# Patient Record
Sex: Male | Born: 1940 | Race: Black or African American | Hispanic: No | Marital: Married | Smoking: Former smoker
Health system: Southern US, Community
[De-identification: ages and names within clinical notes are randomized; demographics above are authoritative.]

## PROBLEM LIST (undated history)

## (undated) DIAGNOSIS — G4730 Sleep apnea, unspecified: Secondary | ICD-10-CM

## (undated) DIAGNOSIS — I10 Essential (primary) hypertension: Secondary | ICD-10-CM

## (undated) DIAGNOSIS — R569 Unspecified convulsions: Secondary | ICD-10-CM

## (undated) DIAGNOSIS — I5030 Unspecified diastolic (congestive) heart failure: Secondary | ICD-10-CM

## (undated) DIAGNOSIS — E119 Type 2 diabetes mellitus without complications: Secondary | ICD-10-CM

## (undated) DIAGNOSIS — N189 Chronic kidney disease, unspecified: Secondary | ICD-10-CM

## (undated) DIAGNOSIS — M1990 Unspecified osteoarthritis, unspecified site: Secondary | ICD-10-CM

## (undated) DIAGNOSIS — N183 Chronic kidney disease, stage 3 unspecified: Secondary | ICD-10-CM

## (undated) DIAGNOSIS — I82409 Acute embolism and thrombosis of unspecified deep veins of unspecified lower extremity: Secondary | ICD-10-CM

## (undated) DIAGNOSIS — M7989 Other specified soft tissue disorders: Secondary | ICD-10-CM

## (undated) DIAGNOSIS — M7750 Other enthesopathy of unspecified foot: Secondary | ICD-10-CM

## (undated) DIAGNOSIS — F0390 Unspecified dementia without behavioral disturbance: Secondary | ICD-10-CM

## (undated) DIAGNOSIS — M109 Gout, unspecified: Secondary | ICD-10-CM

## (undated) DIAGNOSIS — N400 Enlarged prostate without lower urinary tract symptoms: Secondary | ICD-10-CM

## (undated) DIAGNOSIS — Z8739 Personal history of other diseases of the musculoskeletal system and connective tissue: Secondary | ICD-10-CM

## (undated) HISTORY — PX: APPENDECTOMY: SHX54

## (undated) HISTORY — DX: Acute embolism and thrombosis of unspecified deep veins of unspecified lower extremity: I82.409

## (undated) HISTORY — PX: HERNIA REPAIR: SHX51

## (undated) HISTORY — DX: Unspecified osteoarthritis, unspecified site: M19.90

## (undated) HISTORY — DX: Chronic kidney disease, unspecified: N18.9

## (undated) HISTORY — DX: Benign prostatic hyperplasia without lower urinary tract symptoms: N40.0

## (undated) HISTORY — DX: Other enthesopathy of unspecified foot and ankle: M77.50

## (undated) HISTORY — DX: Sleep apnea, unspecified: G47.30

## (undated) HISTORY — PX: PROSTATE SURGERY: SHX751

## (undated) HISTORY — DX: Personal history of other diseases of the musculoskeletal system and connective tissue: Z87.39

## (undated) HISTORY — DX: Other specified soft tissue disorders: M79.89

## (undated) HISTORY — DX: Essential (primary) hypertension: I10

## (undated) HISTORY — DX: Unspecified convulsions: R56.9

---
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
## 1999-01-24 ENCOUNTER — Other Ambulatory Visit: Admission: RE | Admit: 1999-01-24 | Discharge: 1999-01-24 | Payer: Self-pay | Admitting: Otolaryngology

## 1999-02-18 ENCOUNTER — Ambulatory Visit (HOSPITAL_BASED_OUTPATIENT_CLINIC_OR_DEPARTMENT_OTHER): Admission: RE | Admit: 1999-02-18 | Discharge: 1999-02-18 | Payer: Self-pay | Admitting: Otolaryngology

## 2008-09-12 ENCOUNTER — Ambulatory Visit: Payer: Self-pay | Admitting: Nephrology

## 2009-04-22 ENCOUNTER — Emergency Department: Payer: Self-pay | Admitting: Unknown Physician Specialty

## 2011-07-03 ENCOUNTER — Ambulatory Visit: Payer: Self-pay | Admitting: Urology

## 2011-07-03 LAB — BASIC METABOLIC PANEL
Anion Gap: 5 — ABNORMAL LOW (ref 7–16)
BUN: 26 mg/dL — ABNORMAL HIGH (ref 7–18)
Calcium, Total: 9.2 mg/dL (ref 8.5–10.1)
Chloride: 107 mmol/L (ref 98–107)
Co2: 28 mmol/L (ref 21–32)
Creatinine: 1.25 mg/dL (ref 0.60–1.30)
EGFR (African American): >60
EGFR (Non-African Amer.): >60
Glucose: 76 mg/dL (ref 65–99)
Osmolality: 283 (ref 275–301)
Potassium: 5.1 mmol/L (ref 3.5–5.1)
Sodium: 140 mmol/L (ref 136–145)

## 2011-07-03 LAB — HEMOGLOBIN: HGB: 13 g/dL (ref 13.0–18.0)

## 2011-07-07 ENCOUNTER — Ambulatory Visit: Payer: Self-pay | Admitting: Urology

## 2011-07-15 ENCOUNTER — Ambulatory Visit: Payer: Self-pay | Admitting: General Practice

## 2011-08-28 ENCOUNTER — Ambulatory Visit: Payer: Self-pay

## 2011-09-02 ENCOUNTER — Other Ambulatory Visit: Payer: Self-pay | Admitting: Physician Assistant

## 2011-09-02 ENCOUNTER — Inpatient Hospital Stay: Payer: Self-pay | Admitting: Internal Medicine

## 2011-09-02 ENCOUNTER — Ambulatory Visit: Payer: Self-pay | Admitting: General Practice

## 2011-09-02 LAB — COMPREHENSIVE METABOLIC PANEL
Albumin: 3.7 g/dL (ref 3.4–5.0)
Albumin: 3.7 g/dL (ref 3.4–5.0)
Alkaline Phosphatase: 80 U/L (ref 50–136)
Alkaline Phosphatase: 74 U/L (ref 50–136)
Anion Gap: 10 (ref 7–16)
Anion Gap: 10 (ref 7–16)
BUN: 26 mg/dL — ABNORMAL HIGH (ref 7–18)
BUN: 28 mg/dL — ABNORMAL HIGH (ref 7–18)
Bilirubin,Total: 0.5 mg/dL (ref 0.2–1.0)
Bilirubin,Total: 0.4 mg/dL (ref 0.2–1.0)
Calcium, Total: 9.2 mg/dL (ref 8.5–10.1)
Calcium, Total: 9.3 mg/dL (ref 8.5–10.1)
Chloride: 104 mmol/L (ref 98–107)
Chloride: 105 mmol/L (ref 98–107)
Co2: 26 mmol/L (ref 21–32)
Co2: 26 mmol/L (ref 21–32)
Creatinine: 1.45 mg/dL — ABNORMAL HIGH (ref 0.60–1.30)
Creatinine: 1.41 mg/dL — ABNORMAL HIGH (ref 0.60–1.30)
EGFR (African American): >60
EGFR (African American): >60
EGFR (Non-African Amer.): 51 — ABNORMAL LOW
EGFR (Non-African Amer.): 53 — ABNORMAL LOW
Glucose: 93 mg/dL (ref 65–99)
Glucose: 75 mg/dL (ref 65–99)
Osmolality: 284 (ref 275–301)
Osmolality: 285 (ref 275–301)
Potassium: 4.3 mmol/L (ref 3.5–5.1)
Potassium: 4.3 mmol/L (ref 3.5–5.1)
SGOT(AST): 46 U/L — ABNORMAL HIGH (ref 15–37)
SGOT(AST): 46 U/L — ABNORMAL HIGH (ref 15–37)
SGPT (ALT): 55 U/L
SGPT (ALT): 53 U/L
Sodium: 140 mmol/L (ref 136–145)
Sodium: 141 mmol/L (ref 136–145)
Total Protein: 7.5 g/dL (ref 6.4–8.2)
Total Protein: 7.9 g/dL (ref 6.4–8.2)

## 2011-09-02 LAB — CBC WITH DIFFERENTIAL/PLATELET
Basophil #: 0 10*3/uL (ref 0.0–0.1)
Basophil %: 0.3 %
Eosinophil #: 0.3 10*3/uL (ref 0.0–0.7)
Eosinophil %: 4.5 %
HCT: 40 % (ref 40.0–52.0)
HGB: 12.7 g/dL — ABNORMAL LOW (ref 13.0–18.0)
Lymphocyte #: 2 10*3/uL (ref 1.0–3.6)
Lymphocyte %: 30.3 %
MCH: 23.1 pg — ABNORMAL LOW (ref 26.0–34.0)
MCHC: 31.7 g/dL — ABNORMAL LOW (ref 32.0–36.0)
MCV: 73 fL — ABNORMAL LOW (ref 80–100)
Monocyte #: 0.8 10*3/uL — ABNORMAL HIGH (ref 0.0–0.7)
Monocyte %: 12.1 %
Neutrophil #: 3.3 10*3/uL (ref 1.4–6.5)
Neutrophil %: 52.8 %
Platelet: 192 10*3/uL (ref 150–440)
RBC: 5.49 10*6/uL (ref 4.40–5.90)
RDW: 16.8 % — ABNORMAL HIGH (ref 11.5–14.5)
WBC: 6.4 10*3/uL (ref 3.8–10.6)

## 2011-09-02 LAB — CBC
HCT: 39.6 % — ABNORMAL LOW (ref 40.0–52.0)
HGB: 12.4 g/dL — ABNORMAL LOW (ref 13.0–18.0)
MCH: 23 pg — ABNORMAL LOW (ref 26.0–34.0)
MCHC: 31.3 g/dL — ABNORMAL LOW (ref 32.0–36.0)
MCV: 73 fL — ABNORMAL LOW (ref 80–100)
Platelet: 195 10*3/uL (ref 150–440)
RBC: 5.39 10*6/uL (ref 4.40–5.90)
RDW: 18.4 % — ABNORMAL HIGH (ref 11.5–14.5)
WBC: 6.6 10*3/uL (ref 3.8–10.6)

## 2011-09-02 LAB — PROTIME-INR
INR: 1
Prothrombin Time: 13.4 secs (ref 11.5–14.7)

## 2011-09-02 LAB — PRO B NATRIURETIC PEPTIDE: B-Type Natriuretic Peptide: 139 pg/mL — ABNORMAL HIGH (ref 0–125)

## 2011-09-02 LAB — URIC ACID: Uric Acid: 6 mg/dL (ref 3.5–7.2)

## 2011-09-02 LAB — APTT: Activated PTT: 32.9 secs (ref 23.6–35.9)

## 2011-09-02 LAB — MAGNESIUM: Magnesium: 1.7 mg/dL — ABNORMAL LOW

## 2011-09-03 LAB — BASIC METABOLIC PANEL
Anion Gap: 13 (ref 7–16)
BUN: 23 mg/dL — ABNORMAL HIGH (ref 7–18)
Calcium, Total: 8.8 mg/dL (ref 8.5–10.1)
Chloride: 104 mmol/L (ref 98–107)
Co2: 23 mmol/L (ref 21–32)
Creatinine: 1.31 mg/dL — ABNORMAL HIGH (ref 0.60–1.30)
EGFR (African American): >60
EGFR (Non-African Amer.): 58 — ABNORMAL LOW
Glucose: 94 mg/dL (ref 65–99)
Osmolality: 283 (ref 275–301)
Potassium: 4 mmol/L (ref 3.5–5.1)
Sodium: 140 mmol/L (ref 136–145)

## 2011-09-03 LAB — APTT
Activated PTT: 147.3 secs — ABNORMAL HIGH (ref 23.6–35.9)
Activated PTT: 153.4 secs — ABNORMAL HIGH (ref 23.6–35.9)
Activated PTT: >160 secs (ref 23.6–35.9)
Activated PTT: >160 secs (ref 23.6–35.9)

## 2011-09-03 LAB — CBC WITH DIFFERENTIAL/PLATELET
Basophil #: 0 10*3/uL (ref 0.0–0.1)
Basophil %: 0.3 %
Eosinophil #: 0.3 10*3/uL (ref 0.0–0.7)
Eosinophil %: 4.8 %
HCT: 36 % — ABNORMAL LOW (ref 40.0–52.0)
HGB: 11.5 g/dL — ABNORMAL LOW (ref 13.0–18.0)
Lymphocyte #: 1.8 10*3/uL (ref 1.0–3.6)
Lymphocyte %: 30 %
MCH: 23.4 pg — ABNORMAL LOW (ref 26.0–34.0)
MCHC: 31.8 g/dL — ABNORMAL LOW (ref 32.0–36.0)
MCV: 73 fL — ABNORMAL LOW (ref 80–100)
Monocyte #: 0.9 10*3/uL — ABNORMAL HIGH (ref 0.0–0.7)
Monocyte %: 15.7 %
Neutrophil #: 3 10*3/uL (ref 1.4–6.5)
Neutrophil %: 49.2 %
Platelet: 185 10*3/uL (ref 150–440)
RBC: 4.91 10*6/uL (ref 4.40–5.90)
RDW: 17.6 % — ABNORMAL HIGH (ref 11.5–14.5)
WBC: 6 10*3/uL (ref 3.8–10.6)

## 2011-09-04 LAB — PROTIME-INR
INR: 1.1
Prothrombin Time: 14.1 secs (ref 11.5–14.7)

## 2011-09-04 LAB — APTT
Activated PTT: 145.3 secs — ABNORMAL HIGH (ref 23.6–35.9)
Activated PTT: 103.3 secs — ABNORMAL HIGH (ref 23.6–35.9)

## 2011-09-05 LAB — CREATININE, SERUM
Creatinine: 1.26 mg/dL (ref 0.60–1.30)
EGFR (African American): >60
EGFR (Non-African Amer.): >60

## 2011-09-05 LAB — PLATELET COUNT: Platelet: 220 10*3/uL (ref 150–440)

## 2011-09-05 LAB — HEMOGLOBIN: HGB: 11.4 g/dL — ABNORMAL LOW (ref 13.0–18.0)

## 2011-09-05 LAB — APTT: Activated PTT: 107.8 secs — ABNORMAL HIGH (ref 23.6–35.9)

## 2011-09-26 ENCOUNTER — Ambulatory Visit: Payer: Self-pay | Admitting: Internal Medicine

## 2011-09-26 LAB — CBC CANCER CENTER
Basophil #: 0 x10 3/mm (ref 0.0–0.1)
Basophil %: 0.4 %
Eosinophil #: 0.1 x10 3/mm (ref 0.0–0.7)
Eosinophil %: 1.5 %
HCT: 37.7 % — ABNORMAL LOW (ref 40.0–52.0)
HGB: 12.1 g/dL — ABNORMAL LOW (ref 13.0–18.0)
Lymphocyte #: 1.9 x10 3/mm (ref 1.0–3.6)
Lymphocyte %: 26.6 %
MCH: 23.5 pg — ABNORMAL LOW (ref 26.0–34.0)
MCHC: 32.1 g/dL (ref 32.0–36.0)
MCV: 73 fL — ABNORMAL LOW (ref 80–100)
Monocyte #: 0.7 x10 3/mm (ref 0.0–0.7)
Monocyte %: 10.4 %
Neutrophil #: 4.3 x10 3/mm (ref 1.4–6.5)
Neutrophil %: 61.1 %
Platelet: 194 x10 3/mm (ref 150–440)
RBC: 5.15 10*6/uL (ref 4.40–5.90)
RDW: 17.9 % — ABNORMAL HIGH (ref 11.5–14.5)
WBC: 7 x10 3/mm (ref 3.8–10.6)

## 2011-09-26 LAB — CREATININE, SERUM
Creatinine: 1.73 mg/dL — ABNORMAL HIGH (ref 0.60–1.30)
EGFR (African American): 51 — ABNORMAL LOW
EGFR (Non-African Amer.): 42 — ABNORMAL LOW

## 2011-09-26 LAB — IRON AND TIBC
Iron Bind.Cap.(Total): 313 ug/dL (ref 250–450)
Iron Saturation: 21 %
Iron: 66 ug/dL (ref 65–175)
Unbound Iron-Bind.Cap.: 247 ug/dL

## 2011-09-26 LAB — RETICULOCYTES
Absolute Retic Count: 0.111 10*6/uL — ABNORMAL HIGH (ref 0.024–0.084)
Reticulocyte: 2.2 % — ABNORMAL HIGH (ref 0.5–1.5)

## 2011-09-26 LAB — FERRITIN: Ferritin (ARMC): 192 ng/mL (ref 8–388)

## 2011-09-27 LAB — CEA: CEA: 3.6 ng/mL (ref 0.0–4.7)

## 2011-09-27 LAB — CANCER ANTIGEN 19-9: CA 19-9: <1 U/mL (ref 0–35)

## 2011-09-29 ENCOUNTER — Ambulatory Visit: Payer: Self-pay | Admitting: Physician Assistant

## 2011-10-10 LAB — CBC CANCER CENTER
Basophil #: 0 x10 3/mm (ref 0.0–0.1)
Basophil %: 0.5 %
Eosinophil #: 0.1 x10 3/mm (ref 0.0–0.7)
Eosinophil %: 1.8 %
HCT: 39 % — ABNORMAL LOW (ref 40.0–52.0)
HGB: 12 g/dL — ABNORMAL LOW (ref 13.0–18.0)
Lymphocyte #: 1.8 x10 3/mm (ref 1.0–3.6)
Lymphocyte %: 28.1 %
MCH: 22.8 pg — ABNORMAL LOW (ref 26.0–34.0)
MCHC: 30.8 g/dL — ABNORMAL LOW (ref 32.0–36.0)
MCV: 74 fL — ABNORMAL LOW (ref 80–100)
Monocyte #: 0.7 x10 3/mm (ref 0.2–1.0)
Monocyte %: 11.8 %
Neutrophil #: 3.7 x10 3/mm (ref 1.4–6.5)
Neutrophil %: 57.8 %
Platelet: 197 x10 3/mm (ref 150–440)
RBC: 5.27 10*6/uL (ref 4.40–5.90)
RDW: 18 % — ABNORMAL HIGH (ref 11.5–14.5)
WBC: 6.3 x10 3/mm (ref 3.8–10.6)

## 2011-10-10 LAB — RETICULOCYTES
Absolute Retic Count: 0.0785 10*6/uL (ref 0.024–0.084)
Reticulocyte: 1.49 % (ref 0.5–1.5)

## 2011-10-10 LAB — CREATININE, SERUM
Creatinine: 1.9 mg/dL — ABNORMAL HIGH (ref 0.60–1.30)
EGFR (African American): 41 — ABNORMAL LOW
EGFR (Non-African Amer.): 35 — ABNORMAL LOW

## 2011-10-14 LAB — PROT IMMUNOELECTROPHORES(ARMC)

## 2011-10-14 LAB — KAPPA/LAMBDA FREE LIGHT CHAINS (ARMC)

## 2011-10-22 ENCOUNTER — Ambulatory Visit: Payer: Self-pay | Admitting: Internal Medicine

## 2011-10-22 ENCOUNTER — Ambulatory Visit: Payer: Self-pay | Admitting: Physician Assistant

## 2011-11-05 LAB — CBC CANCER CENTER
Basophil #: 0 x10 3/mm (ref 0.0–0.1)
Basophil %: 0.7 %
Eosinophil #: 0.2 x10 3/mm (ref 0.0–0.7)
Eosinophil %: 4.2 %
HCT: 33.4 % — ABNORMAL LOW (ref 40.0–52.0)
HGB: 10.3 g/dL — ABNORMAL LOW (ref 13.0–18.0)
Lymphocyte #: 1.5 x10 3/mm (ref 1.0–3.6)
Lymphocyte %: 28.9 %
MCH: 22.9 pg — ABNORMAL LOW (ref 26.0–34.0)
MCHC: 30.8 g/dL — ABNORMAL LOW (ref 32.0–36.0)
MCV: 74 fL — ABNORMAL LOW (ref 80–100)
Monocyte #: 0.7 x10 3/mm (ref 0.2–1.0)
Monocyte %: 13.3 %
Neutrophil #: 2.8 x10 3/mm (ref 1.4–6.5)
Neutrophil %: 52.9 %
Platelet: 186 x10 3/mm (ref 150–440)
RBC: 4.49 10*6/uL (ref 4.40–5.90)
RDW: 17.9 % — ABNORMAL HIGH (ref 11.5–14.5)
WBC: 5.3 x10 3/mm (ref 3.8–10.6)

## 2011-11-05 LAB — CREATININE, SERUM
Creatinine: 1.98 mg/dL — ABNORMAL HIGH (ref 0.60–1.30)
EGFR (African American): 39 — ABNORMAL LOW
EGFR (Non-African Amer.): 33 — ABNORMAL LOW

## 2011-11-05 LAB — URIC ACID: Uric Acid: 6.5 mg/dL (ref 3.5–7.2)

## 2011-11-05 LAB — CALCIUM: Calcium, Total: 9.3 mg/dL (ref 8.5–10.1)

## 2011-11-07 LAB — CREATININE, SERUM
Creatinine: 1.83 mg/dL — ABNORMAL HIGH (ref 0.60–1.30)
EGFR (African American): 43 — ABNORMAL LOW
EGFR (Non-African Amer.): 37 — ABNORMAL LOW

## 2011-11-11 LAB — OCCULT BLOOD X 1 CARD TO LAB, STOOL
Occult Blood, Feces: NEGATIVE
Occult Blood, Feces: NEGATIVE
Occult Blood, Feces: NEGATIVE

## 2011-11-14 LAB — CREATININE, SERUM
Creatinine: 1.69 mg/dL — ABNORMAL HIGH (ref 0.60–1.30)
EGFR (African American): 47 — ABNORMAL LOW
EGFR (Non-African Amer.): 41 — ABNORMAL LOW

## 2011-11-14 LAB — CANCER CENTER HEMOGLOBIN: HGB: 10.2 g/dL — ABNORMAL LOW (ref 13.0–18.0)

## 2011-11-18 LAB — URINE IEP, RANDOM

## 2011-11-22 ENCOUNTER — Ambulatory Visit: Payer: Self-pay | Admitting: Internal Medicine

## 2011-12-01 LAB — CBC CANCER CENTER
Basophil #: 0 x10 3/mm (ref 0.0–0.1)
Basophil %: 0.4 %
Eosinophil #: 0.1 x10 3/mm (ref 0.0–0.7)
Eosinophil %: 1.2 %
HCT: 32.5 % — ABNORMAL LOW (ref 40.0–52.0)
HGB: 10 g/dL — ABNORMAL LOW (ref 13.0–18.0)
Lymphocyte #: 2.2 x10 3/mm (ref 1.0–3.6)
Lymphocyte %: 29.2 %
MCH: 22.7 pg — ABNORMAL LOW (ref 26.0–34.0)
MCHC: 30.9 g/dL — ABNORMAL LOW (ref 32.0–36.0)
MCV: 74 fL — ABNORMAL LOW (ref 80–100)
Monocyte #: 0.8 x10 3/mm (ref 0.2–1.0)
Monocyte %: 10.9 %
Neutrophil #: 4.4 x10 3/mm (ref 1.4–6.5)
Neutrophil %: 58.3 %
Platelet: 201 x10 3/mm (ref 150–440)
RBC: 4.41 10*6/uL (ref 4.40–5.90)
RDW: 17.7 % — ABNORMAL HIGH (ref 11.5–14.5)
WBC: 7.5 x10 3/mm (ref 3.8–10.6)

## 2011-12-01 LAB — CREATININE, SERUM
Creatinine: 2.65 mg/dL — ABNORMAL HIGH (ref 0.60–1.30)
EGFR (African American): 27 — ABNORMAL LOW
EGFR (Non-African Amer.): 24 — ABNORMAL LOW

## 2011-12-02 LAB — URINE IEP, RANDOM

## 2011-12-08 LAB — CREATININE, SERUM
Creatinine: 1.96 mg/dL — ABNORMAL HIGH (ref 0.60–1.30)
EGFR (African American): 39 — ABNORMAL LOW
EGFR (Non-African Amer.): 34 — ABNORMAL LOW

## 2011-12-22 ENCOUNTER — Ambulatory Visit: Payer: Self-pay | Admitting: Internal Medicine

## 2012-01-05 LAB — CREATININE, SERUM
Creatinine: 1.63 mg/dL — ABNORMAL HIGH
EGFR (African American): 49 — ABNORMAL LOW
EGFR (Non-African Amer.): 42 — ABNORMAL LOW

## 2012-01-05 LAB — CBC CANCER CENTER
Basophil #: 0 "x10 3/mm "
Basophil %: 0.5 %
Eosinophil #: 0.1 "x10 3/mm "
Eosinophil %: 1.6 %
HCT: 33.8 % — ABNORMAL LOW
HGB: 10.2 g/dL — ABNORMAL LOW
Lymphocyte %: 20.4 %
Lymphs Abs: 1.5 "x10 3/mm "
MCH: 22.3 pg — ABNORMAL LOW
MCHC: 30.1 g/dL — ABNORMAL LOW
MCV: 74 fL — ABNORMAL LOW
Monocyte #: 0.9 "x10 3/mm "
Monocyte %: 11.9 %
Neutrophil #: 4.9 "x10 3/mm "
Neutrophil %: 65.6 %
Platelet: 205 "x10 3/mm "
RBC: 4.55 "x10 6/mm "
RDW: 17.4 % — ABNORMAL HIGH
WBC: 7.5 "x10 3/mm "

## 2012-01-22 ENCOUNTER — Ambulatory Visit: Payer: Self-pay | Admitting: Internal Medicine

## 2012-03-15 ENCOUNTER — Ambulatory Visit: Payer: Self-pay | Admitting: Internal Medicine

## 2012-03-15 LAB — CBC CANCER CENTER
Basophil #: 0 x10 3/mm (ref 0.0–0.1)
Basophil %: 0.4 %
Eosinophil #: 0.1 x10 3/mm (ref 0.0–0.7)
Eosinophil %: 2 %
HCT: 36.6 % — ABNORMAL LOW (ref 40.0–52.0)
HGB: 11.3 g/dL — ABNORMAL LOW (ref 13.0–18.0)
Lymphocyte #: 1.6 x10 3/mm (ref 1.0–3.6)
Lymphocyte %: 26.6 %
MCH: 21.9 pg — ABNORMAL LOW (ref 26.0–34.0)
MCHC: 30.8 g/dL — ABNORMAL LOW (ref 32.0–36.0)
MCV: 71 fL — ABNORMAL LOW (ref 80–100)
Monocyte #: 0.7 x10 3/mm (ref 0.2–1.0)
Monocyte %: 12.2 %
Neutrophil #: 3.5 x10 3/mm (ref 1.4–6.5)
Neutrophil %: 58.8 %
Platelet: 188 x10 3/mm (ref 150–440)
RBC: 5.16 10*6/uL (ref 4.40–5.90)
RDW: 17.7 % — ABNORMAL HIGH (ref 11.5–14.5)
WBC: 6 x10 3/mm (ref 3.8–10.6)

## 2012-03-15 LAB — CREATININE, SERUM
Creatinine: 1.51 mg/dL — ABNORMAL HIGH (ref 0.60–1.30)
EGFR (African American): 53 — ABNORMAL LOW
EGFR (Non-African Amer.): 46 — ABNORMAL LOW

## 2012-03-15 LAB — RETICULOCYTES
Absolute Retic Count: 0.0692 10*6/uL (ref 0.031–0.129)
Reticulocyte: 1.34 % (ref 0.7–2.5)

## 2012-03-23 ENCOUNTER — Ambulatory Visit: Payer: Self-pay | Admitting: Internal Medicine

## 2012-05-10 ENCOUNTER — Ambulatory Visit: Payer: Self-pay | Admitting: Internal Medicine

## 2012-05-10 LAB — CANCER CENTER HEMOGLOBIN: HGB: 11.6 g/dL — ABNORMAL LOW (ref 13.0–18.0)

## 2012-05-23 ENCOUNTER — Ambulatory Visit: Payer: Self-pay | Admitting: Internal Medicine

## 2012-06-23 ENCOUNTER — Ambulatory Visit: Payer: Self-pay | Admitting: Internal Medicine

## 2012-07-24 ENCOUNTER — Ambulatory Visit: Payer: Self-pay | Admitting: Internal Medicine

## 2012-07-29 LAB — CANCER CENTER HEMOGLOBIN: HGB: 12.4 g/dL — ABNORMAL LOW (ref 13.0–18.0)

## 2012-08-21 ENCOUNTER — Ambulatory Visit: Payer: Self-pay | Admitting: Internal Medicine

## 2012-09-02 ENCOUNTER — Emergency Department: Payer: Self-pay | Admitting: Emergency Medicine

## 2012-09-02 LAB — URINALYSIS, COMPLETE
Bacteria: NONE SEEN
Bilirubin,UR: NEGATIVE
Glucose,UR: NEGATIVE mg/dL (ref 0–75)
Ketone: NEGATIVE
Leukocyte Esterase: NEGATIVE
Nitrite: NEGATIVE
Ph: 5 (ref 4.5–8.0)
Protein: NEGATIVE
RBC,UR: 1 /HPF (ref 0–5)
Specific Gravity: 1.012 (ref 1.003–1.030)
Squamous Epithelial: NONE SEEN
WBC UR: <1 /HPF (ref 0–5)

## 2012-09-02 LAB — CBC WITH DIFFERENTIAL/PLATELET
Basophil #: 0 10*3/uL (ref 0.0–0.1)
Basophil %: 0.4 %
Eosinophil #: 0 10*3/uL (ref 0.0–0.7)
Eosinophil %: 0.3 %
HCT: 36.5 % — ABNORMAL LOW (ref 40.0–52.0)
HGB: 11.5 g/dL — ABNORMAL LOW (ref 13.0–18.0)
Lymphocyte #: 0.3 10*3/uL — ABNORMAL LOW (ref 1.0–3.6)
Lymphocyte %: 4.6 %
MCH: 22.6 pg — ABNORMAL LOW (ref 26.0–34.0)
MCHC: 31.5 g/dL — ABNORMAL LOW (ref 32.0–36.0)
MCV: 72 fL — ABNORMAL LOW (ref 80–100)
Monocyte #: 0.4 x10 3/mm (ref 0.2–1.0)
Monocyte %: 5.6 %
Neutrophil #: 6.8 10*3/uL — ABNORMAL HIGH (ref 1.4–6.5)
Neutrophil %: 89.1 %
Platelet: 167 10*3/uL (ref 150–440)
RBC: 5.1 10*6/uL (ref 4.40–5.90)
RDW: 17.7 % — ABNORMAL HIGH (ref 11.5–14.5)
WBC: 7.6 10*3/uL (ref 3.8–10.6)

## 2012-09-02 LAB — BASIC METABOLIC PANEL
Anion Gap: 6 — ABNORMAL LOW (ref 7–16)
BUN: 34 mg/dL — ABNORMAL HIGH (ref 7–18)
Calcium, Total: 8.7 mg/dL (ref 8.5–10.1)
Chloride: 109 mmol/L — ABNORMAL HIGH (ref 98–107)
Co2: 24 mmol/L (ref 21–32)
Creatinine: 1.79 mg/dL — ABNORMAL HIGH (ref 0.60–1.30)
EGFR (African American): 44 — ABNORMAL LOW
EGFR (Non-African Amer.): 38 — ABNORMAL LOW
Glucose: 151 mg/dL — ABNORMAL HIGH (ref 65–99)
Osmolality: 288 (ref 275–301)
Potassium: 4.5 mmol/L (ref 3.5–5.1)
Sodium: 139 mmol/L (ref 136–145)

## 2012-09-02 LAB — DRUG SCREEN, URINE

## 2012-09-02 LAB — ETHANOL
Ethanol %: <0.003 % (ref 0.000–0.080)
Ethanol: <3 mg/dL

## 2012-09-03 ENCOUNTER — Ambulatory Visit: Payer: Self-pay

## 2012-09-03 LAB — CREATININE, SERUM
Creatinine: 1.62 mg/dL — ABNORMAL HIGH (ref 0.60–1.30)
EGFR (African American): 49 — ABNORMAL LOW
EGFR (Non-African Amer.): 42 — ABNORMAL LOW

## 2012-09-21 ENCOUNTER — Ambulatory Visit: Payer: Self-pay | Admitting: Neurology

## 2012-10-23 ENCOUNTER — Emergency Department: Payer: Self-pay | Admitting: Emergency Medicine

## 2012-10-23 LAB — CBC WITH DIFFERENTIAL/PLATELET
Basophil #: 0 10*3/uL (ref 0.0–0.1)
Basophil %: 0.3 %
Eosinophil #: 0.1 10*3/uL (ref 0.0–0.7)
Eosinophil %: 1 %
HCT: 39.4 % — ABNORMAL LOW (ref 40.0–52.0)
HGB: 12.6 g/dL — ABNORMAL LOW (ref 13.0–18.0)
Lymphocyte #: 1.6 10*3/uL (ref 1.0–3.6)
Lymphocyte %: 18.7 %
MCH: 22.9 pg — ABNORMAL LOW (ref 26.0–34.0)
MCHC: 31.9 g/dL — ABNORMAL LOW (ref 32.0–36.0)
MCV: 72 fL — ABNORMAL LOW (ref 80–100)
Monocyte #: 0.5 x10 3/mm (ref 0.2–1.0)
Monocyte %: 5.5 %
Neutrophil #: 6.2 10*3/uL (ref 1.4–6.5)
Neutrophil %: 74.5 %
Platelet: 174 10*3/uL (ref 150–440)
RBC: 5.49 10*6/uL (ref 4.40–5.90)
RDW: 18.1 % — ABNORMAL HIGH (ref 11.5–14.5)
WBC: 8.3 10*3/uL (ref 3.8–10.6)

## 2012-10-23 LAB — BASIC METABOLIC PANEL
Anion Gap: 7 (ref 7–16)
BUN: 26 mg/dL — ABNORMAL HIGH (ref 7–18)
Calcium, Total: 9.6 mg/dL (ref 8.5–10.1)
Chloride: 109 mmol/L — ABNORMAL HIGH (ref 98–107)
Co2: 24 mmol/L (ref 21–32)
Creatinine: 1.73 mg/dL — ABNORMAL HIGH (ref 0.60–1.30)
EGFR (African American): 45 — ABNORMAL LOW
EGFR (Non-African Amer.): 39 — ABNORMAL LOW
Glucose: 144 mg/dL — ABNORMAL HIGH (ref 65–99)
Osmolality: 287 (ref 275–301)
Potassium: 4.4 mmol/L (ref 3.5–5.1)
Sodium: 140 mmol/L (ref 136–145)

## 2013-02-23 ENCOUNTER — Ambulatory Visit: Payer: Self-pay | Admitting: Neurology

## 2013-03-08 ENCOUNTER — Ambulatory Visit: Payer: Self-pay | Admitting: Neurology

## 2013-04-07 ENCOUNTER — Encounter: Payer: Self-pay | Admitting: Podiatry

## 2013-04-08 ENCOUNTER — Encounter: Payer: Self-pay | Admitting: Podiatry

## 2013-04-08 ENCOUNTER — Ambulatory Visit (INDEPENDENT_AMBULATORY_CARE_PROVIDER_SITE_OTHER): Payer: Medicare Other | Admitting: Podiatry

## 2013-04-08 VITALS — BP 125/73 | HR 64 | Resp 16 | Ht 72.0 in | Wt 305.0 lb

## 2013-04-08 DIAGNOSIS — B351 Tinea unguium: Secondary | ICD-10-CM

## 2013-04-08 DIAGNOSIS — M79609 Pain in unspecified limb: Secondary | ICD-10-CM

## 2013-04-08 NOTE — Progress Notes (Signed)
Subjective:     Patient ID: Justin Shannon, male   DOB: 1941/03/14, 72 y.o.   MRN: [id number]  HPI patient states my nails are bothering me and are too thick for me to cut   Review of Systems  All other systems reviewed and are negative.       Objective:   Physical Exam  Nursing note and vitals reviewed. Cardiovascular: Intact distal pulses.   Neurological: He is alert.  Skin: Skin is warm.   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

## 2013-07-08 ENCOUNTER — Ambulatory Visit: Payer: Medicare Other | Admitting: Podiatry

## 2013-11-06 DIAGNOSIS — N183 Chronic kidney disease, stage 3 unspecified: Secondary | ICD-10-CM | POA: Insufficient documentation

## 2013-11-06 DIAGNOSIS — N184 Chronic kidney disease, stage 4 (severe): Secondary | ICD-10-CM | POA: Insufficient documentation

## 2013-11-06 DIAGNOSIS — G40909 Epilepsy, unspecified, not intractable, without status epilepticus: Secondary | ICD-10-CM | POA: Insufficient documentation

## 2013-11-06 DIAGNOSIS — I825Y9 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: Secondary | ICD-10-CM | POA: Insufficient documentation

## 2013-11-06 DIAGNOSIS — Z86718 Personal history of other venous thrombosis and embolism: Secondary | ICD-10-CM | POA: Insufficient documentation

## 2013-11-06 DIAGNOSIS — R569 Unspecified convulsions: Secondary | ICD-10-CM | POA: Insufficient documentation

## 2013-11-06 DIAGNOSIS — M109 Gout, unspecified: Secondary | ICD-10-CM | POA: Insufficient documentation

## 2013-11-06 DIAGNOSIS — I129 Hypertensive chronic kidney disease with stage 1 through stage 4 chronic kidney disease, or unspecified chronic kidney disease: Secondary | ICD-10-CM | POA: Insufficient documentation

## 2013-11-06 DIAGNOSIS — J309 Allergic rhinitis, unspecified: Secondary | ICD-10-CM | POA: Insufficient documentation

## 2013-11-11 ENCOUNTER — Ambulatory Visit (INDEPENDENT_AMBULATORY_CARE_PROVIDER_SITE_OTHER): Payer: Medicare Other | Admitting: Podiatry

## 2013-11-11 ENCOUNTER — Encounter: Payer: Self-pay | Admitting: Podiatry

## 2013-11-11 VITALS — BP 110/80 | HR 68 | Resp 16

## 2013-11-11 DIAGNOSIS — M79609 Pain in unspecified limb: Secondary | ICD-10-CM

## 2013-11-11 DIAGNOSIS — B351 Tinea unguium: Secondary | ICD-10-CM

## 2013-11-11 NOTE — Progress Notes (Signed)
Subjective:     Patient ID: Justin Shannon, male   DOB: 08/31/1940, 73 y.o.   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

## 2013-11-22 DIAGNOSIS — N184 Chronic kidney disease, stage 4 (severe): Secondary | ICD-10-CM | POA: Insufficient documentation

## 2013-11-22 DIAGNOSIS — N183 Chronic kidney disease, stage 3 unspecified: Secondary | ICD-10-CM | POA: Insufficient documentation

## 2014-01-10 DIAGNOSIS — Z9989 Dependence on other enabling machines and devices: Secondary | ICD-10-CM

## 2014-01-10 DIAGNOSIS — G4733 Obstructive sleep apnea (adult) (pediatric): Secondary | ICD-10-CM | POA: Insufficient documentation

## 2014-02-10 ENCOUNTER — Other Ambulatory Visit: Payer: Medicare Other

## 2014-05-05 ENCOUNTER — Ambulatory Visit (INDEPENDENT_AMBULATORY_CARE_PROVIDER_SITE_OTHER): Payer: Medicare Other | Admitting: Podiatry

## 2014-05-05 DIAGNOSIS — B351 Tinea unguium: Secondary | ICD-10-CM

## 2014-05-05 DIAGNOSIS — M79676 Pain in unspecified toe(s): Secondary | ICD-10-CM

## 2014-05-05 NOTE — Progress Notes (Signed)
Subjective:     Patient ID: Justin Shannon., male   DOB: 12-23-1940, 73 y.o.   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

## 2014-05-24 DIAGNOSIS — Z6841 Body Mass Index (BMI) 40.0 and over, adult: Secondary | ICD-10-CM | POA: Insufficient documentation

## 2014-08-04 ENCOUNTER — Other Ambulatory Visit: Payer: Medicare Other

## 2014-08-25 ENCOUNTER — Ambulatory Visit (INDEPENDENT_AMBULATORY_CARE_PROVIDER_SITE_OTHER): Payer: Medicare Other

## 2014-08-25 ENCOUNTER — Ambulatory Visit (INDEPENDENT_AMBULATORY_CARE_PROVIDER_SITE_OTHER): Payer: Medicare Other | Admitting: Podiatry

## 2014-08-25 DIAGNOSIS — M722 Plantar fascial fibromatosis: Secondary | ICD-10-CM

## 2014-08-25 DIAGNOSIS — M79673 Pain in unspecified foot: Secondary | ICD-10-CM

## 2014-08-25 DIAGNOSIS — B351 Tinea unguium: Secondary | ICD-10-CM

## 2014-08-25 MED ORDER — TRIAMCINOLONE ACETONIDE 10 MG/ML IJ SUSP
10.0000 mg | Freq: Once | INTRAMUSCULAR | Status: AC
  Administered 2014-08-25: 10 mg

## 2014-08-25 NOTE — Progress Notes (Signed)
Subjective:     [name], male   DOB: 19-Mar-1941, 74 y.o.   MRN: [id number]  HPI patient presents stating I'm having a lot of pain in my right heel and my nails are thick and I cannot cut them myself and they become painful. States the heels been hurting for several months   Review of Systems     Objective:   Physical Exam Neurovascular status intact with muscle strength adequate and range of motion within normal limits. [name] noted to have quite a bit of discomfort plantar aspect right heel at the insertion of the tendon the calcaneus and thick yellow brittle nailbeds 1-5 both feet that are painful when pressed    Assessment:     Plantar fasciitis right with inflammation and fluid buildup and mycotic nail infection 1-5 of both feet with pain    Plan:     Reviewed both conditions and x-ray of right foot and injected the right plantar fascia 3 mg Kenalog 5 mg Xylocaine and debrided painful nailbeds 1-5 both feet with no iatrogenic bleeding noted and dispensed fascial brace with instructions on usage and instructions for supportive shoe and reappoint again in 1 week

## 2014-10-15 NOTE — Consult Note (Signed)
PATIENT NAME:  Justin Shannon, Justin Shannon MR#:  [id number] DATE OF BIRTH:  Jun 06, 1941  DATE OF CONSULTATION:  09/04/2011  REFERRING PHYSICIAN:   CONSULTING PHYSICIAN:  Algernon Huxley, MD  REASON FOR CONSULTATION: Extensive right lower extremity DVT.  HISTORY OF PRESENT ILLNESS: This is a 74 year old male who was admitted with extensive right lower extremity DVT with swelling. He's had swelling for 3 to 4 weeks now, has gotten worse over the past week or so. He has been on anticoagulation since admission two days prior and has tolerated this so far without any bleeding or other problems. We were consulted for further evaluation for adjuvant treatments in the form of either IVC filter or thrombolysis for his DVT. His swelling has improved some with the initiation of anticoagulation. He does not have any chest pain or shortness of breath.   PAST MEDICAL HISTORY:  1. Hypertension.  2. Benign prostatic hypertrophy.  3. Gout.  PAST SURGICAL HISTORY:  1. Surgery for benign prostatic hypertrophy earlier this year.  2. Hernia repair.   ALLERGIES: None.   HOME MEDICATIONS PER THE HISTORY AND PHYSICAL: 1. Losartan 100 mg daily.  2. Diltiazem 240 daily.  3. Allopurinol 100 daily.  4. Metoprolol 50 daily.  5. Allegra 180 daily.  6. Flonase nasal spray as needed.   SOCIAL HISTORY: Works at [employer] at [employer]. Lives with his wife. No alcohol or tobacco use. He is active and independent.   FAMILY HISTORY: Apparently his mother also had a history of a blood clot in the leg.  REVIEW OF SYSTEMS: GENERAL: No fevers or chills. No intentional weight loss or gain. EYES: No blurry or double vision. EARS: No tinnitus or ear pain. CARDIAC: No chest pain or palpitations. RESPIRATORY: No shortness of breath or cough. GI: No nausea, vomiting, or diarrhea. GU: No dysuria or hematuria. ENDOCRINE: No heat or cold intolerance. PSYCH: No anxiety or depression. MUSCULOSKELETAL: Right leg pain and swelling.   PHYSICAL  EXAMINATION:   GENERAL: This is a well developed, well nourished white male who was not in apparent distress.  VITAL SIGNS: Temperature 98.4, pulse 67, blood pressure 146/81, saturations are 95% on room air.   HEENT: Normocephalic, atraumatic.   EYES: Sclerae nonicteric. Conjunctivae are clear.   EARS: Normal external appearance. Hearing is intact.   NECK: Supple without adenopathy or JVD.   HEART: Regular rate and rhythm.   LUNGS: Clear to auscultation bilaterally.   ABDOMEN: Soft, nondistended, nontender.   EXTREMITIES: He has mild to moderate right lower extremity swelling with some stasis changes present. There is no significant left lower extremity swelling.   NEUROLOGIC: Normal strength and tone in all four extremities.   SKIN: Warm and dry.   PSYCH: Normal affect and mood.  LABORATORY, DIAGNOSTIC, AND RADIOLOGICAL DATA: Labs from yesterday sodium 140, potassium 4.0, chloride 104, CO2 23, BUN 23, creatinine 1.31, glucose 94, white blood cell count 6.0, hemoglobin 11.5, platelet count 185,000.   ASSESSMENT AND PLAN: This is a 73 year old with DVT which has likely been present for 3 to 4 weeks. He has been having swelling and pain since that time and this puts [place] out of the window from where thrombolysis would be of benefit. His ultrasound does show extensive DVT in the right lower extremity in all the visualized veins but given the chronicity of his symptoms I do not think offering thrombolysis at this time is likely to provide any benefit. In addition with no PE and so far having tolerated anticoagulation for the  last two days on admission, I do not think a filter is currently necessary. Should he have any issues with anticoagulation or bleeding, this would have to be stopped and then a filter would certainly be reasonable. No other recommendations at this time other than outpatient compression stockings. I would be happy to see the patient in the future for evaluation of his  postphlebitic symptoms if desired.  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   ____________________________ Algernon Huxley, MD jsd:drc D: 09/22/2011 13:52:17 ET T: 09/22/2011 14:48:18 ET JOB#: [id number]  cc: Algernon Huxley, MD, <Dictator> Algernon Huxley MD ELECTRONICALLY SIGNED 09/24/2011 15:03

## 2014-10-15 NOTE — Discharge Summary (Signed)
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 Vascular Surgery did not recommend IVC filter. The patient and family are in agreement for Xarelto along with consultant. Negative hypercoagulable work-up so far. The patient understands the risks and benefits of anticoagulation.   SECONDARY DIAGNOSES:  1. Hypertension. 2. History of gout.  3. Benign prostatic hypertrophy.   CONSULTANT: Leotis Pain, MD - Vascular Surgery.  PROCEDURES/RADIOLOGY: Right lower extremity Doppler on 09/02/2011: Extensive deep venous thrombosis of the right lower extremity involving the common femoral, superficial femoral, and popliteal vein. Complete occlusion of the popliteal vein.   MAJOR LABORATORY PANEL: Antithrombin-3 was within normal limits. Protein C and S were within normal limits. Antiphospholipid antibodies were negative. No lupus anticoagulant was detected.   HISTORY AND SHORT HOSPITAL COURSE: The patient is a 74 year old male with the above-mentioned medical problem who was admitted for extensive deep venous thrombosis of the right lower extremity involving the common femoral vein along with popliteal vein. Please see Dr. Dixie Dials dictated history and physical for further details. The patient had right lower extremity swelling on admission and was started on heparin drip along with Coumadin. He also had mild renal insufficiency, which was resolved with IV hydration and was thought to be prerenal in nature. He was evaluated by vascular surgery, Dr. Lucky Cowboy, who recommended anticoagulation for 3 to 6 months and outpatient follow-up. He did not recommend IVC filter at  this time. The patient's symptoms were slowly improving. The family had multiple questions which were answered to the best of my knowledge. After a long discussion with consultant and the family along with the patient, the decision was made to use Xarelto as an anticoagulant instead of Coumadin and the patient was switched over to Xarelto. On the date of discharge, he was feeling much better and was discharged in stable condition.   DISCHARGE PHYSICAL EXAMINATION:   VITAL SIGNS: On the date of discharge his temperature was 97.8, heart rate 70 per minute, respirations 18 per minute, blood pressure 129/80 mmHg, and he was saturating 97% on room air.   CARDIOVASCULAR: S1 and S2 normal. No murmurs, rubs, or gallops.   LUNGS: Clear to auscultation bilaterally. No wheezing, rales, rhonchi, or crepitation.   ABDOMEN: Soft and benign.   NEUROLOGIC: Nonfocal examination.   EXTREMITIES: He still has right lower extremity swelling and minimal redness mainly on standing up, but no signs of cellulitis. He was also feeling quite weak and was requiring assistance with ambulation and will benefit from home health with physical therapy and was set up by care management.   All other physical examination remained at baseline.   DISCHARGE MEDICATIONS:  1. Losartan 100 mg p.o. daily.  2. Cardizem 240 mg p.o. daily.  3. Allopurinol 100 mg p.o. daily.  4. Metoprolol 50 mg p.o. daily.  5. Allegra 180 mg p.o. daily as needed.  6. Colace 100 mg p.o. daily.  7. Hydrochlorothiazide 25 mg p.o. daily.  8. One-A-Day Men's Health vitamin once a day.  9. Fluticasone two sprays in each nostril as needed.  10. Xarelto 15 mg p.o. twice a day for the first 21 days followed by 20 mg once a day  until evaluated by oncology or primary care in the next 3 to 6 months.  11. Compression stocking to be placed on lower extremity starting next week to help with postphlebitic symptoms.   DISCHARGE DIET: Low sodium.   DISCHARGE  ACTIVITY: As tolerated.   DISCHARGE INSTRUCTIONS AND FOLLOW-UP: The patient was instructed to follow-up with his primary care physician, Dr. Golden Pop, in 1 to 2 weeks. He will need follow-up with Dr. Inez Pilgrim from oncology in 2 to 3 weeks and with podiatry in 2 to 4 weeks. He was instructed to start wearing compression stockings next week to help with post-phlebitic symptoms. He was set up to get home health, nursing, and physical therapy at home.   TOTAL TIME DISCHARGING THIS PATIENT: 55 minutes. ____________________________ Lucina Mellow. Manuella Ghazi, MD vss:slb D: 09/06/2011 13:17:51 ET T: 09/07/2011 14:22:33 ET JOB#: [id number]  cc: Greydis Stlouis S. Manuella Ghazi, MD, <Dictator> Guadalupe Maple, MD Simonne Come. Inez Pilgrim, MD Algernon Huxley, MD Wallene Huh, DPM Lucina Mellow [facility] MD ELECTRONICALLY SIGNED 09/08/2011 9:38

## 2014-10-15 NOTE — H&P (Signed)
PATIENT NAME:  Justin Shannon, DOUPE MR#:  [id number] DATE OF BIRTH:  May 26, 1941  DATE OF ADMISSION:  09/02/2011  PRIMARY CARE PHYSICIAN: Dr. Golden Pop   CHIEF COMPLAINT: Right lower extremity swelling.   HISTORY OF PRESENT ILLNESS: The patient is a 74 year old male with a history of hypertension, gout, arthritis, benign prostatic hypertrophy. Today he was sent to the Emergency Room from Ultrasound because he had a Doppler ultrasound of the right lower extremity that is positive for extensive deep vein thrombosis involving the common femoral vein, the superficial femoral vein, and the popliteal. He said that he has been having swelling of the right lower extremity for about 3 to 4 weeks now. He is able to bear weight on it. He says it feels like the muscle is pulling, but he denies any significant pain; but for the last few days he has noted some redness on the lower leg also, and it is probably twice the size of the other leg. He denies any chest pain, any shortness of breath. He denies any nausea, vomiting, abdominal pain. No previous history of any blood clot in a leg or the lung. He had a right lower extremity ultrasound done on 08/15/2011, and that was negative. His last surgery was done in January of 2013. The patient had a green light laser photovaporization of the prostate done for benign prostatic hypertrophy at that time, but postoperatively he did not have any deep vein thrombosis. So this deep venous thrombosis happened probably around two months after surgery. He denies any immobilization. He says he is quite active at work. He works at [employer] at [employer]. He denies any bleeding from any site. He has been initiated on IV heparin drip in the Emergency Room; so, the patient is getting admitted for new onset deep venous thrombosis which is extensive in the right lower extremity.   REVIEW OF SYSTEMS: CONSTITUTIONAL: He denies any fever or weakness. HEENT: No acute change in vision. No headache.  No dizziness. RESPIRATORY: No cough. No dyspnea. CARDIOVASCULAR: No chest pain. GI: No nausea, vomiting, diarrhea. GU: No dysuria. No frequency. ENDOCRINE: No thyroid problems. HEMATOLOGIC: No anemia. No easy bruisability. MUSCULOSKELETAL: Complaining of swelling of the right leg. NEUROLOGICAL:  No focal numbness or weakness. PSYCHIATRIC: No anxiety.   PAST MEDICAL HISTORY:  1. Hypertension.  2. History of gout.  3. Benign prostatic hypertrophy.   PAST SURGICAL HISTORY:  1. Surgery for benign prostatic hypertrophy in January 2013.  2. Hernia repair in the past.   ALLERGIES TO MEDICATIONS: None.   HOME MEDICATIONS:  His home medications, which I reviewed with the patient, as per the discharge list from January:  1. Losartan 100 mg daily.  2. Diltiazem CD 240 mg daily.  3. Allopurinol 100 mg daily.  4. Metoprolol 50 mg daily.  5. Allegra 180 mg as needed. 6. Flonase nasal spray as needed.   SOCIAL HISTORY: He lives with his wife. No smoking or alcohol use. He is quite active and independent. He works at [employer] at [employer].  FAMILY HISTORY: He says his mother had a blood clot in the leg.   PHYSICAL EXAMINATION:  VITAL SIGNS: Temperature 98.3, heart rate of 71,  respiratory rate 20, blood pressure 160/76, saturating 95% on room air.   GENERAL: This is an obese African American male comfortably lying in bed, no acute distress.   HEENT: Bilateral pupils are equal. Extraocular muscles are intact. No scleral icterus. No conjunctivitis. Oral mucosa is moist. No pallor.  NECK: No thyroid tenderness, enlargement or nodule. Neck is supple. No masses, nontender. No adenopathy. No JVD. No carotid bruit.   CHEST: Bilateral breath sounds are clear. No wheeze. Normal effort. No respiratory distress.   HEART: Heart sounds are regular. No murmur. He has peripheral pulses 1+.   ABDOMEN: Soft, obese, nontender. No masses appreciable. No hepatosplenomegaly. No bruit.  RECTAL: Exam is  deferred.   NEUROLOGICAL: He is awake, alert, oriented to time, place, and person. Cranial nerves are intact. Moving all extremities against gravity.   EXTREMITIES: No cyanosis. No clubbing.   SKIN: His right lower extremity is more swollen than the left one. He has erythema involving the right leg area. The right leg is extremely tight, no significant tenderness, though. His right Homans sign is positive. Bilateral dorsalis pedis is palpable.   LABORATORY, DIAGNOSTIC AND RADIOLOGICAL DATA:  White count of 6.6, hemoglobin 12.4, platelet count 195,000.  BMP: Sodium 141, potassium 4.3, BUN 28, creatinine 1.41, magnesium 1.7. His last creatinine was 1.25 in January of 2013.  INR 1.0.  His chest x-ray on March 7th was negative, and he had a Doppler ultrasound today which showed extensive deep vein thrombosis of the common femoral vein, superficial femoral vein, and the popliteal.  EKG shows sinus rhythm, left axis, no acute ischemic changes.   IMPRESSION:  1. 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  2. Hypertension.  3. History of gout.  4. Benign prostatic hypertrophy. 5. Mild renal insufficiency.   PLAN:  A 74 year old male with history of hypertension, gout, benign prostatic hypertrophy, presented with right lower extremity swelling going on for 3 to 4 weeks, found to have an extensive deep vein thrombosis of the right lower extremity, also includes the proximal common femoral vein. The patient had last surgery done on 07/05/2011. This has developed two months postoperatively, unlikely to be related to surgery. I am going to check a hypercoagulable work-up on him. He has been started on heparin drip. I am going to start him on Coumadin from tomorrow. I will also give him some IV hydration because he has mild renal insufficiency. I will continue his Losartan, metoprolol and Cardizem for now. I will get a Vascular consult on him because of his extensive deep vein  thrombosis. He can be followed by Vascular as an outpatient. I will also replace his magnesium.   The  plan was discussed with the patient and the family in detail.   TIME SPENT:   Time spent with admission and coordination of care was 45 minutes.     ____________________________ Mena Pauls, MD ag:cbb D: 09/02/2011 17:40:42 ET T: 09/02/2011 21:05:10 ET JOB#: [id number]  cc: Mena Pauls, MD, <Dictator> Guadalupe Maple, MD Mena Pauls MD ELECTRONICALLY SIGNED 09/24/2011 16:14

## 2014-10-15 NOTE — Op Note (Signed)
PATIENT NAME:  Justin Shannon, Justin Shannon MR#:  [id number] DATE OF BIRTH:  [birth year]  DATE OF PROCEDURE:  07/07/2011  PREOPERATIVE DIAGNOSIS: Benign prostatic hypertrophy with bladder outlet obstruction.   POSTOPERATIVE DIAGNOSIS: Benign prostatic hypertrophy with bladder outlet obstruction.   PROCEDURE: Photovaporization of the prostate with the GreenLight laser.   SURGEON: Maryan Puls, M.D.   ANESTHETIST: Dr. [place] Service.   ANESTHESIA: General.   INDICATIONS: See the dictated history and physical. After informed consent, the patient requested the above procedure.   OPERATIVE SUMMARY: After adequate general anesthesia had been obtained, the patient was placed into the dorsal lithotomy position and the perineum was prepped and draped in the usual fashion. The laser scope was then coupled with the camera and then visually advanced into the bladder. The bladder was moderately trabeculated. Both ureteral orifices were identified and had clear efflux. The patient had an obstructing median lobe with moderate amount of lateral tissue as well. At this point, the XPS laser fiber was introduced through the scope and the median lobe and bladder neck was vaporized at a power setting of 80 watts. The remaining tissue was vaporized at 80 watts. Vaporization proceeded from the bladder neck to the verumontanum. At this point, the scope was removed and a 20 [place] silicone catheter placed. The catheter was irrigated until clear. A B and O suppository was placed. The procedure was then terminated and the patient was transferred to the recovery room in stable condition.   ____________________________ Otelia Limes. Yves Dill, MD mrw:ap D: 07/07/2011 11:17:10 ET           T: 07/07/2011 11:35:36 ET               JOB#: [id number] cc: Otelia Limes. Yves Dill, MD, <Dictator> Guadalupe Maple, MD Royston Cowper MD ELECTRONICALLY SIGNED 07/10/2011 12:41

## 2014-10-15 NOTE — Consult Note (Signed)
Patient admitted with extensive RLE DVT.  Has had leg swelling for 3-4 weeks, but worse since the weekend.  Has been on anti-coagulation since admission and tolerated well.  Too late for thrombolysis (2-3 week window) as has now likely become more chronic.  Has tolerated anti-coagulation thus far and would not appear to need a filter at this point.  Would continue anti-cogulation.  Will need 6 months of anti-coagulation with either Coumadin or Xarelto, and I think either one is fine.  Should begin wearing compression stockings next week to help with post-phlebitic symptoms.    Electronic Signatures: Algernon Huxley (MD)  (Signed on 14-Mar-13 13:58)  Authored  Last Updated: 14-Mar-13 13:58 by Algernon Huxley (MD)

## 2014-10-15 NOTE — H&P (Signed)
PATIENT NAME:  AUGUSTEN, TESFAY MR#:  [id number] DATE OF BIRTH:  Feb 14, 1941  DATE OF ADMISSION:  07/07/2011  CHIEF COMPLAINT: Difficulty voiding.   HISTORY OF PRESENT ILLNESS: Mr. Surette is a 74 year old African American male with a greater than one-year history of significant lower urinary tract symptoms including nocturia and urgency. He has been treated for benign prostatic hypertrophy with finasteride and Flomax without symptomatic improvement. He was evaluated in the office with prostate ultrasound which revealed a 15 gram prostate and cystoscopy which revealed median lobe obstructing the bladder neck. Uroflow was done on 06/02/2011 indicating significant obstruction. He comes in now for photovaporization of the prostate with the green light laser.   ALLERGIES: No known drug allergies.   CURRENT MEDICATIONS: Allopurinol, diltiazem, Losartan, metoprolol, finasteride, and Flomax.   PAST SURGICAL HISTORY:  1. Appendectomy in 1966.  2. Vasectomy in 1990. 3. Right inguinal herniorrhaphy in 1999. 4. Throat surgery in the 1990s.   SOCIAL HISTORY: He denied tobacco or alcohol use.   FAMILY HISTORY: History is remarkable for parents with diabetes.  PAST/CURRENT MEDICAL CONDITIONS: 1. Hypertension.  2. Gout.  3. Arthritis.  REVIEW OF SYSTEMS: The patient denied chest pain, shortness of breath, or diabetes. He does have chronic swelling of his lower extremities.   PHYSICAL EXAMINATION:   GENERAL: Obese African American male in no distress.   HEENT: Sclerae were clear. Pupils were equally round and reactive to light and accommodation. Extraocular movements are intact.   NECK: Supple. No palpable cervical adenopathy.   LUNGS: Clear to auscultation.   HEART: Regular rhythm and rate without audible murmur.   ABDOMEN: Soft and nontender abdomen.   GENITOURINARY: He had a buried penis. Testes are smooth and nontender, 18 mL size each.   RECTAL: 40 gram smooth nontender prostate.    NEUROMUSCULAR: Grossly intact.   IMPRESSION: Benign prostatic hypertrophy with bladder outlet obstruction.   PLAN: Photovaporization of the prostate with the green light laser.  ____________________________ Otelia Limes. Yves Dill, MD mrw:slb D: 07/03/2011 11:20:29 ET T: 07/03/2011 11:34:28 ET JOB#: [id number]  cc: Otelia Limes. Yves Dill, MD, <Dictator> Royston Cowper MD ELECTRONICALLY SIGNED 07/03/2011 12:52

## 2014-11-24 ENCOUNTER — Ambulatory Visit: Payer: Medicare Other

## 2014-11-28 ENCOUNTER — Ambulatory Visit (INDEPENDENT_AMBULATORY_CARE_PROVIDER_SITE_OTHER): Payer: Medicare Other | Admitting: Podiatry

## 2014-11-28 DIAGNOSIS — B351 Tinea unguium: Secondary | ICD-10-CM | POA: Diagnosis not present

## 2014-11-28 DIAGNOSIS — M79676 Pain in unspecified toe(s): Secondary | ICD-10-CM

## 2014-11-28 NOTE — Progress Notes (Signed)
Subjective: 74 y.o.-year-old male returns the office today for painful, elongated, thickened toenails. Denies any redness or drainage around the nails. Denies any acute changes since last appointment and no new complaints today. Denies any systemic complaints such as fevers, chills, nausea, vomiting.   Objective: AAO 3, NAD DP/PT pulses palpable, CRT less than 3 seconds Protective sensation intact with Simms Weinstein monofilament, Achilles tendon reflex intact.  Nails hypertrophic, dystrophic, elongated, brittle, discolored 10. There is tenderness overlying the nails 1-5 bilaterally. There is no surrounding erythema or drainage along the nail sites. No open lesions or pre-ulcerative lesions are identified. No other areas of tenderness bilateral lower extremities. No overlying edema, erythema, increased warmth. No pain with calf compression, swelling, warmth, erythema.  Assessment: Patient presents with symptomatic onychomycosis  Plan: -Treatment options including alternatives, risks, complications were discussed -Nails sharply debrided 10 without complication/bleeding. -Discussed daily foot inspection. If there are any changes, to call the office immediately.  -Follow-up in 3 months or sooner if any problems are to arise. In the meantime, encouraged to call the office with any questions, concerns, changes symptoms.

## 2014-12-11 DIAGNOSIS — Z0000 Encounter for general adult medical examination without abnormal findings: Secondary | ICD-10-CM | POA: Insufficient documentation

## 2015-03-01 ENCOUNTER — Ambulatory Visit: Payer: Medicare Other

## 2015-03-08 ENCOUNTER — Ambulatory Visit (INDEPENDENT_AMBULATORY_CARE_PROVIDER_SITE_OTHER): Payer: Medicare Other | Admitting: Podiatry

## 2015-03-08 DIAGNOSIS — B351 Tinea unguium: Secondary | ICD-10-CM | POA: Diagnosis not present

## 2015-03-08 DIAGNOSIS — M79676 Pain in unspecified toe(s): Secondary | ICD-10-CM

## 2015-03-08 NOTE — Progress Notes (Signed)
Subjective: 74 y.o.-year-old male returns the office today for painful, elongated, thickened toenails which he is unable to trim himself. Denies any redness or drainage around the nails. Denies any acute changes since last appointment and no new complaints today. Denies any systemic complaints such as fevers, chills, nausea, vomiting.   Objective: AAO 3, NAD DP/PT pulses palpable, CRT less than 3 seconds Protective sensation intact with Simms Weinstein monofilament Nails hypertrophic, dystrophic, elongated, brittle, discolored 10. There is tenderness overlying the nails 1-5 bilaterally. There is no surrounding erythema or drainage along the nail sites. No open lesions or pre-ulcerative lesions are identified. No other areas of tenderness bilateral lower extremities. No overlying edema, erythema, increased warmth. No pain with calf compression, swelling, warmth, erythema.  Assessment: Patient presents with symptomatic onychomycosis  Plan: -Treatment options including alternatives, risks, complications were discussed -Nails sharply debrided 10 without complication/bleeding. -Discussed daily foot inspection. If there are any changes, to call the office immediately.  -Follow-up in 3 months or sooner if any problems are to arise. In the meantime, encouraged to call the office with any questions, concerns, changes symptoms.   Celesta Gentile, DPM

## 2015-06-07 ENCOUNTER — Ambulatory Visit: Payer: Medicare Other

## 2015-06-29 ENCOUNTER — Ambulatory Visit: Payer: Medicare Other | Admitting: Sports Medicine

## 2015-07-10 ENCOUNTER — Ambulatory Visit (INDEPENDENT_AMBULATORY_CARE_PROVIDER_SITE_OTHER): Payer: Medicare Other | Admitting: Sports Medicine

## 2015-07-10 ENCOUNTER — Encounter: Payer: Self-pay | Admitting: Sports Medicine

## 2015-07-10 DIAGNOSIS — B351 Tinea unguium: Secondary | ICD-10-CM

## 2015-07-10 DIAGNOSIS — M79676 Pain in unspecified toe(s): Secondary | ICD-10-CM | POA: Diagnosis not present

## 2015-07-10 NOTE — Progress Notes (Signed)
Patient ID: Justin Shannon., male   DOB: April 01, 1941, 75 y.o.   MRN: [id number]  Subjective: Justin Shannon. is a 75 y.o. male patient seen today in office with complaint of painful thickened and elongated toenails; unable to trim. Patient denies history of Diabetes, Neuropathy, or Vascular disease. Patient has no other pedal complaints at this time.   There are no active problems to display for this patient.  Current Outpatient Prescriptions on File Prior to Visit  Medication Sig Dispense Refill  . allopurinol (ZYLOPRIM) 100 MG tablet Take 100 mg by mouth daily.    Marland Kitchen aspirin 81 MG tablet Take 81 mg by mouth daily.    Marland Kitchen diltiazem (CARDIZEM LA) 240 MG 24 hr tablet Take 240 mg by mouth daily.    Marland Kitchen docusate sodium (COLACE) 100 MG capsule Take 100 mg by mouth daily.    . fexofenadine (ALLEGRA) 180 MG tablet Take 180 mg by mouth daily as needed.    . hydrochlorothiazide (HYDRODIURIL) 25 MG tablet Take 25 mg by mouth daily.    Marland Kitchen losartan (COZAAR) 100 MG tablet Take 100 mg by mouth daily.    . metoprolol (LOPRESSOR) 50 MG tablet Take 50 mg by mouth daily.    . Multiple Vitamin (ONE-A-DAY MENS PO) Take by mouth daily.     No current facility-administered medications on file prior to visit.   No Known Allergies     Objective: Physical Exam  General: Well developed, nourished, no acute distress, awake, alert and oriented x 3  Vascular: Dorsalis pedis artery 2/4 bilateral, Posterior tibial artery 1/4 bilateral, skin temperature warm to warm proximal to distal bilateral lower extremities, no varicosities, scant pedal hair present bilateral. Trace edema bilateral lower extremities.   Neurological: Gross sensation present via light touch bilateral.   Dermatological: Skin is warm, dry, and supple bilateral, Nails 1-10 are tender, long, thick, and discolored with mild subungal debris, no webspace macerations present bilateral, no open lesions present bilateral, no callus/corns/hyperkeratotic  tissue present bilateral. No signs of infection bilateral.  Musculoskeletal: No boney deformities noted bilateral. Muscular strength within normal limits without pain or limitation on range of motion. No pain with calf compression bilateral.  Assessment and Plan:  Problem List Items Addressed This Visit    None    Visit Diagnoses    Dermatophytosis of nail    -  Primary    Pain of toe, unspecified laterality          -Examined patient.  -Discussed treatment options for painful mycotic nails. -Mechanically debrided and reduced mycotic nails with sterile nail nipper and dremel nail file without incident. -Recommend elevation, diet control, and wearing compression stockings to assist with edema control.  -Patient to return in 3 months for follow up evaluation or sooner if symptoms worsen.  Landis Martins, DPM

## 2015-10-09 ENCOUNTER — Ambulatory Visit: Payer: Medicare Other | Admitting: Sports Medicine

## 2015-11-02 ENCOUNTER — Ambulatory Visit (INDEPENDENT_AMBULATORY_CARE_PROVIDER_SITE_OTHER): Payer: Medicare Other | Admitting: Sports Medicine

## 2015-11-02 ENCOUNTER — Encounter: Payer: Self-pay | Admitting: Sports Medicine

## 2015-11-02 DIAGNOSIS — B351 Tinea unguium: Secondary | ICD-10-CM

## 2015-11-02 DIAGNOSIS — M79676 Pain in unspecified toe(s): Secondary | ICD-10-CM

## 2015-11-02 NOTE — Progress Notes (Signed)
[name], male   DOB: 25-Mar-1941, 75 y.o.   MRN: [id number]  Subjective: Justin Ramirez. is a 75 y.o. male patient seen today in office with complaint of painful thickened and elongated toenails; unable to trim. Patient denies any changes medical history since last visit. Patient has no other pedal complaints at this time.   Patient Active Problem List   Diagnosis Date Noted  . Encounter for general adult medical examination without abnormal findings 12/11/2014  . Morbid (severe) obesity due to excess calories ([place]) 05/24/2014  . Obstructive apnea 01/10/2014  . Chronic kidney disease (CKD), stage III (moderate) 11/22/2013  . Allergic rhinitis 11/06/2013  . Benign hypertension 11/06/2013  . Epilepsy ([place]) 11/06/2013  . Gout 11/06/2013  . H/O deep venous thrombosis 11/06/2013   Current Outpatient Prescriptions on File Prior to Visit  Medication Sig Dispense Refill  . allopurinol (ZYLOPRIM) 100 MG tablet Take 100 mg by mouth daily.    Marland Kitchen aspirin 81 MG tablet Take 81 mg by mouth daily.    Marland Kitchen diltiazem (CARDIZEM LA) 240 MG 24 hr tablet Take 240 mg by mouth daily.    Marland Kitchen docusate sodium (COLACE) 100 MG capsule Take 100 mg by mouth daily.    . fexofenadine (ALLEGRA) 180 MG tablet Take 180 mg by mouth daily as needed.    . hydrochlorothiazide (HYDRODIURIL) 25 MG tablet Take 25 mg by mouth daily.    Marland Kitchen losartan (COZAAR) 100 MG tablet Take 100 mg by mouth daily.    . metoprolol (LOPRESSOR) 50 MG tablet Take 50 mg by mouth daily.    . Multiple Vitamin (ONE-A-DAY MENS PO) Take by mouth daily.     No current facility-administered medications on file prior to visit.   No Known Allergies     Objective: Physical Exam  General: Well developed, nourished, no acute distress, awake, alert and oriented x 3  Vascular: Dorsalis pedis artery 2/4 bilateral, Posterior tibial artery 1/4 bilateral, skin temperature warm to warm proximal to distal bilateral lower extremities, no  varicosities, scant pedal hair present bilateral. Trace edema bilateral lower extremities.   Neurological: Gross sensation present via light touch bilateral.   Dermatological: Skin is warm, dry, and supple bilateral, Nails 1-10 are tender, long, thick, and discolored with mild subungal debris, no webspace macerations present bilateral, no open lesions present bilateral, no callus/corns/hyperkeratotic tissue present bilateral. No signs of infection bilateral.  Musculoskeletal: No boney deformities noted bilateral. Muscular strength within normal limits without pain or limitation on range of motion. No pain with calf compression bilateral.  Assessment and Plan:  Problem List Items Addressed This Visit    None    Visit Diagnoses    Dermatophytosis of nail    -  Primary    Pain of toe, unspecified laterality          -Examined patient.  -Discussed treatment options for painful mycotic nails. -Mechanically debrided and reduced mycotic nails with sterile nail nipper and dremel nail file without incident. -Recommend Continue with elevation, diet control, and wearing compression stockings to assist with edema control.  -Patient to return in 3 months for follow up evaluation or sooner if symptoms worsen.  Landis Martins, DPM

## 2015-12-18 ENCOUNTER — Ambulatory Visit (INDEPENDENT_AMBULATORY_CARE_PROVIDER_SITE_OTHER): Payer: Medicare Other | Admitting: Podiatry

## 2015-12-18 ENCOUNTER — Ambulatory Visit: Payer: Medicare Other

## 2015-12-18 ENCOUNTER — Encounter: Payer: Self-pay | Admitting: Podiatry

## 2015-12-18 VITALS — BP 144/49 | HR 61 | Resp 18

## 2015-12-18 DIAGNOSIS — T148 Other injury of unspecified body region: Secondary | ICD-10-CM | POA: Diagnosis not present

## 2015-12-18 DIAGNOSIS — R52 Pain, unspecified: Secondary | ICD-10-CM

## 2015-12-18 DIAGNOSIS — T148XXA Other injury of unspecified body region, initial encounter: Secondary | ICD-10-CM

## 2015-12-18 DIAGNOSIS — S93509A Unspecified sprain of unspecified toe(s), initial encounter: Secondary | ICD-10-CM

## 2015-12-18 NOTE — Progress Notes (Signed)
   Subjective:    [name], male    DOB: [birth year], 74 y.o.   MRN: [id number]  HPI  75 year old male presents the office today for concerns of left big toe swelling. He says he fell out of bed on [place] in a couple days later he started to have increased swelling to the toe. 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 Denies any redness or warmth. No open sores present. No other complaints at this time. No recent treatment.  Review of Systems  All other systems reviewed and are negative.      Objective:   Physical Exam General: AAO x3, NAD  Dermatological: Skin is warm, dry and supple bilateral. There are no open sores, no preulcerative lesions, no rash or signs of infection present.  Vascular: Dorsalis Pedis artery and Posterior Tibial artery pedal pulses are 2/4 bilateral with immedate capillary fill time. Pedal hair growth present. There is no pain with calf compression, swelling, warmth, erythema.   Neruologic: Sensation absent with Derrel Nip monofilament.  Musculoskeletal: There is edema to the left hallux and to the lesser degree of the MPJ. There is no erythema or increase in warmth. There is no pain or resection MPJ range of motion. He states that he can't "feel it" with IPJ ROM. No open sores are present. No other areas of tenderness although he does have neuropathy.  Gait: Unassisted, Nonantalgic.      Assessment & Plan:  75 year old male left hallux contusion, possible sprain MPJ -Treatment options discussed including all alternatives, risks, and complications -Etiology of symptoms were discussed -X-rays were obtained and reviewed with the patient. No definitive evidence of acute fracture identified this time. -Given the swelling history of neuropathy will place in a surgical shoe for immobilization. Elevation. -I'll see him back in 3 weeks. At that time if symptoms continue repeat x-rays. Call any questions or concerns in meantime.    Celesta Gentile, DPM

## 2016-01-08 ENCOUNTER — Encounter: Payer: Self-pay | Admitting: Podiatry

## 2016-01-08 ENCOUNTER — Ambulatory Visit (INDEPENDENT_AMBULATORY_CARE_PROVIDER_SITE_OTHER): Payer: Medicare Other | Admitting: Podiatry

## 2016-01-08 DIAGNOSIS — S93509D Unspecified sprain of unspecified toe(s), subsequent encounter: Secondary | ICD-10-CM

## 2016-01-24 DIAGNOSIS — S99929A Unspecified injury of unspecified foot, initial encounter: Secondary | ICD-10-CM | POA: Insufficient documentation

## 2016-01-24 DIAGNOSIS — S93509A Unspecified sprain of unspecified toe(s), initial encounter: Secondary | ICD-10-CM | POA: Insufficient documentation

## 2016-01-24 NOTE — Progress Notes (Signed)
Subjective: 75 year old male presents to the office for or follow-up evaluation of left big toe pain. He states that he is doing significantly better than last appointment he is able to wear regular shoe. He is able to bend his toes without any pain. The swelling has substantially improved. Denies any redness or warmth at this time. Denies any open sores. No pain. Denies any systemic complaints such as fevers, chills, nausea, vomiting. No acute changes since last appointment, and no other complaints at this time.   Objective: AAO x3, NAD DP/PT pulses palpable bilaterally, CRT less than 3 seconds At this time there is minimal edema to the left hallux, MPJ and this is substantially improved. There is no overlying erythema or increase in warmth. There is no pain with MPJ range of motion or IPJ range of motion. There is no area of tenderness to the toe, MPJ or other areas of the feet bilaterally. No open lesions or pre-ulcerative lesions are identified today. No pain with calf compression, swelling, warmth, erythema.  Assessment: 75 year old male resolved left hallux, IPJ sprain  Plan: -Treatment options discussed including all alternatives, risks, and complications -At this time he is able to wear regular shoe any difficulty. Continue with supportive shoe gear at all times. Monitor for any recurrence of symptoms. If any are to occur to call the office. Ice to the area as needed. Call any questions or concerns.  Celesta Gentile, DPM

## 2016-02-01 ENCOUNTER — Encounter: Payer: Self-pay | Admitting: Sports Medicine

## 2016-02-01 ENCOUNTER — Ambulatory Visit (INDEPENDENT_AMBULATORY_CARE_PROVIDER_SITE_OTHER): Payer: Medicare Other | Admitting: Sports Medicine

## 2016-02-01 DIAGNOSIS — M79673 Pain in unspecified foot: Secondary | ICD-10-CM

## 2016-02-01 DIAGNOSIS — B351 Tinea unguium: Secondary | ICD-10-CM

## 2016-02-01 NOTE — Progress Notes (Signed)
[name], male   DOB: 10-Mar-1941, 75 y.o.   MRN: [id number]  Subjective: Justin Suppes. is a 75 y.o. male patient seen today in office with complaint of painful thickened and elongated toenails; unable to trim. Patient denies any changes medical history since last visit. States that he saw Dr. Jacqualyn Posey for toe sprain which is better now. Patient has no other pedal complaints at this time.   Patient Active Problem List   Diagnosis Date Noted  . Toe sprain 01/24/2016  . Encounter for general adult medical examination without abnormal findings 12/11/2014  . Morbid (severe) obesity due to excess calories ([place]) 05/24/2014  . Obstructive apnea 01/10/2014  . Chronic kidney disease (CKD), stage III (moderate) 11/22/2013  . Allergic rhinitis 11/06/2013  . Benign hypertension 11/06/2013  . Epilepsy ([place]) 11/06/2013  . Gout 11/06/2013  . H/O deep venous thrombosis 11/06/2013   Current Outpatient Prescriptions on File Prior to Visit  Medication Sig Dispense Refill  . allopurinol (ZYLOPRIM) 100 MG tablet Take 100 mg by mouth daily.    Marland Kitchen aspirin 81 MG tablet Take 81 mg by mouth daily.    Marland Kitchen diltiazem (CARDIZEM LA) 240 MG 24 hr tablet Take 240 mg by mouth daily.    Marland Kitchen docusate sodium (COLACE) 100 MG capsule Take 100 mg by mouth daily.    . fexofenadine (ALLEGRA) 180 MG tablet Take 180 mg by mouth daily as needed.    . hydrochlorothiazide (HYDRODIURIL) 25 MG tablet Take 25 mg by mouth daily.    Marland Kitchen losartan (COZAAR) 100 MG tablet Take 100 mg by mouth daily.    . metoprolol (LOPRESSOR) 50 MG tablet Take 50 mg by mouth daily.    . Multiple Vitamin (ONE-A-DAY MENS PO) Take by mouth daily.     No current facility-administered medications on file prior to visit.    No Known Allergies     Objective: Physical Exam  General: Well developed, nourished, no acute distress, awake, alert and oriented x 3  Vascular: Dorsalis pedis artery 2/4 bilateral, Posterior tibial artery 1/4  bilateral, skin temperature warm to warm proximal to distal bilateral lower extremities, no varicosities, scant pedal hair present bilateral. Trace edema bilateral lower extremities.   Neurological: Gross sensation present via light touch bilateral.   Dermatological: Skin is warm, dry, and supple bilateral, Nails 1-10 are tender, long, thick, and discolored with mild subungal debris, no webspace macerations present bilateral, no open lesions present bilateral, no callus/corns/hyperkeratotic tissue present bilateral. No signs of infection bilateral.  Musculoskeletal: No boney deformities noted bilateral. Muscular strength within normal limits without pain or limitation on range of motion. No pain with calf compression bilateral.  Assessment and Plan:  Problem List Items Addressed This Visit    None    Visit Diagnoses    Dermatophytosis of nail    -  Primary   Foot pain, unspecified laterality         -Examined patient.  -Discussed treatment options for painful mycotic nails. -Mechanically debrided and reduced mycotic nails with sterile nail nipper and dremel nail file without incident. -Recommend Continue with elevation, diet control, and wearing compression stockings to assist with edema control.  -Recommend good supportive shoes -Patient to return in 3 months for follow up evaluation or sooner if symptoms worsen.  Landis Martins, DPM

## 2016-02-21 ENCOUNTER — Emergency Department: Payer: Medicare Other

## 2016-02-21 ENCOUNTER — Emergency Department
Admission: EM | Admit: 2016-02-21 | Discharge: 2016-02-21 | Disposition: A | Discharge status: Home / Self Care | Payer: Medicare Other | Attending: Emergency Medicine | Admitting: Emergency Medicine

## 2016-02-21 DIAGNOSIS — Z7982 Long term (current) use of aspirin: Secondary | ICD-10-CM | POA: Diagnosis not present

## 2016-02-21 DIAGNOSIS — R600 Localized edema: Secondary | ICD-10-CM | POA: Diagnosis not present

## 2016-02-21 DIAGNOSIS — Y92410 Unspecified street and highway as the place of occurrence of the external cause: Secondary | ICD-10-CM | POA: Insufficient documentation

## 2016-02-21 DIAGNOSIS — S8010XA Contusion of unspecified lower leg, initial encounter: Secondary | ICD-10-CM

## 2016-02-21 DIAGNOSIS — I110 Hypertensive heart disease with heart failure: Secondary | ICD-10-CM | POA: Diagnosis not present

## 2016-02-21 DIAGNOSIS — Z86718 Personal history of other venous thrombosis and embolism: Secondary | ICD-10-CM | POA: Insufficient documentation

## 2016-02-21 DIAGNOSIS — Z87891 Personal history of nicotine dependence: Secondary | ICD-10-CM | POA: Insufficient documentation

## 2016-02-21 DIAGNOSIS — Z79899 Other long term (current) drug therapy: Secondary | ICD-10-CM | POA: Diagnosis not present

## 2016-02-21 DIAGNOSIS — S8011XA Contusion of right lower leg, initial encounter: Secondary | ICD-10-CM | POA: Insufficient documentation

## 2016-02-21 DIAGNOSIS — Y9389 Activity, other specified: Secondary | ICD-10-CM | POA: Diagnosis not present

## 2016-02-21 DIAGNOSIS — S8991XA Unspecified injury of right lower leg, initial encounter: Secondary | ICD-10-CM | POA: Diagnosis present

## 2016-02-21 DIAGNOSIS — Y999 Unspecified external cause status: Secondary | ICD-10-CM | POA: Diagnosis not present

## 2016-02-21 DIAGNOSIS — I509 Heart failure, unspecified: Secondary | ICD-10-CM | POA: Diagnosis not present

## 2016-02-21 MED ORDER — ACETAMINOPHEN 500 MG PO TABS
ORAL_TABLET | ORAL | Status: AC
  Filled 2016-02-21: qty 2

## 2016-02-21 MED ORDER — ACETAMINOPHEN 500 MG PO TABS
1000.0000 mg | ORAL_TABLET | Freq: Once | ORAL | Status: AC
  Administered 2016-02-21: 1000 mg via ORAL
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
## 2016-02-21 NOTE — Discharge Instructions (Signed)
For the contusions on your lower legs, please apply ice packs for 10 minutes every 2-3 hours to decrease swelling and pain.  You should also elevate your legs above the level of your heart as much as possible.  Please take Tylenol for pain and avoid NSAID medications such as Aleve, Advil, Motrin or ibuprofen which can worsen your kidney function.  Return to the emergency department if you develop severe pain, nausea or vomiting, numbness tingling or weakness, or any other symptoms concerning to you.

## 2016-04-10 ENCOUNTER — Ambulatory Visit (INDEPENDENT_AMBULATORY_CARE_PROVIDER_SITE_OTHER): Payer: Medicare Other | Admitting: Vascular Surgery

## 2016-04-14 ENCOUNTER — Encounter (INDEPENDENT_AMBULATORY_CARE_PROVIDER_SITE_OTHER): Payer: Self-pay | Admitting: Vascular Surgery

## 2016-04-14 ENCOUNTER — Encounter (INDEPENDENT_AMBULATORY_CARE_PROVIDER_SITE_OTHER): Payer: Self-pay

## 2016-04-14 ENCOUNTER — Ambulatory Visit (INDEPENDENT_AMBULATORY_CARE_PROVIDER_SITE_OTHER): Payer: Medicare Other | Admitting: Vascular Surgery

## 2016-04-14 VITALS — BP 131/71 | HR 65 | Resp 16 | Ht 72.0 in | Wt 315.0 lb

## 2016-04-14 DIAGNOSIS — I890 Lymphedema, not elsewhere classified: Secondary | ICD-10-CM | POA: Diagnosis not present

## 2016-04-14 DIAGNOSIS — N183 Chronic kidney disease, stage 3 unspecified: Secondary | ICD-10-CM

## 2016-04-14 DIAGNOSIS — I872 Venous insufficiency (chronic) (peripheral): Secondary | ICD-10-CM

## 2016-04-14 DIAGNOSIS — I10 Essential (primary) hypertension: Secondary | ICD-10-CM

## 2016-05-04 DIAGNOSIS — I872 Venous insufficiency (chronic) (peripheral): Secondary | ICD-10-CM | POA: Insufficient documentation

## 2016-05-04 DIAGNOSIS — I890 Lymphedema, not elsewhere classified: Secondary | ICD-10-CM | POA: Insufficient documentation

## 2016-05-04 NOTE — Progress Notes (Signed)
[facility] Admission History & Physical  MRN : [id number]  Justin Shannon. is a 75 y.o. (May 24, 1941) male who presents with chief complaint of  Chief Complaint  Patient presents with  . Re-evaluation    Follow up, sore on right leg  .  History of Present Illness: The patient returns to the office for followup evaluation regarding leg swelling.  The swelling has persisted but with the lymph pump is much, much better controlled. The pain associated with swelling is essentially eliminated. There have not been any interval development of a ulcerations or wounds.  The patient denies problems with the pump, noting it is working well and the leggings are in good condition.  Since the previous visit the patient has been wearing graduated compression stockings and using the lymph pump on a routine basis and  has noted significant improvement in the lymphedema.   Patient stated the lymph pump has been a very positive factor in her care.    Current Meds  Medication Sig  . allopurinol (ZYLOPRIM) 100 MG tablet Take 100 mg by mouth daily.  Marland Kitchen aspirin 81 MG tablet Take 81 mg by mouth daily.  Marland Kitchen diltiazem (CARDIZEM LA) 240 MG 24 hr tablet Take 240 mg by mouth daily.  Marland Kitchen docusate sodium (COLACE) 100 MG capsule Take 100 mg by mouth daily.  . fexofenadine (ALLEGRA) 180 MG tablet Take 180 mg by mouth daily as needed.  . fluticasone (FLONASE) 50 MCG/ACT nasal spray Place into the nose.  . hydrochlorothiazide (HYDRODIURIL) 25 MG tablet Take 25 mg by mouth daily.  Marland Kitchen levETIRAcetam (KEPPRA) 500 MG tablet Take 1 tablet by mouth two  times daily  . losartan (COZAAR) 100 MG tablet Take 100 mg by mouth daily.  . metoprolol (LOPRESSOR) 50 MG tablet Take 50 mg by mouth daily.  . Multiple Vitamin (ONE-A-DAY MENS PO) Take by mouth daily.    Past Medical History:  Diagnosis Date  . Arthritis   . Bilateral swelling of feet   . BPH (benign prostatic hyperplasia)   . DVT (deep venous  thrombosis) (HCC)    right lower extremity  . History of gout   . Hypertension   . Tendonitis of foot    left    Past Surgical History:  Procedure Laterality Date  . APPENDECTOMY    . HERNIA REPAIR      Social History Social History  Substance Use Topics  . Smoking status: Former Research scientist (life sciences)  . Smokeless tobacco: Never Used     Comment: 25 years ago quit  . Alcohol use No    Family History Family History  Problem Relation Age of Onset  . Arthritis Mother   . Hypertension Mother     Allergies  Allergen Reactions  . Shellfish Allergy      REVIEW OF SYSTEMS (Negative unless checked)  Constitutional: [] Weight loss  [] Fever  [] Chills Cardiac: [] Chest pain   [] Chest pressure   [] Palpitations   [] Shortness of breath when laying flat   [] Shortness of breath with exertion. Vascular:  [] Pain in legs with walking   [] Pain in legs at rest  [] History of DVT   [] Phlebitis   [] Swelling in legs   [] Varicose veins   [] Non-healing ulcers Pulmonary:   [] Uses home oxygen   [] Productive cough   [] Hemoptysis   [] Wheeze  [] COPD   [] Asthma Neurologic:  [] Dizziness   [] Seizures   [] History of stroke   [] History of TIA  [] Aphasia   [] Vissual changes   []   Weakness or numbness in arm   [] Weakness or numbness in leg Musculoskeletal:   [] Joint swelling   [] Joint pain   [] Low back pain Hematologic:  [] Easy bruising  [] Easy bleeding   [] Hypercoagulable state   [] Anemic Gastrointestinal:  [] Diarrhea   [] Vomiting  [] Gastroesophageal reflux/heartburn   [] Difficulty swallowing. Genitourinary:  [] Chronic kidney disease   [] Difficult urination  [] Frequent urination   [] Blood in urine Skin:  [] Rashes   [] Ulcers  Psychological:  [] History of anxiety   []  History of major depression.  Physical Examination  Vitals:   04/14/16 1124  BP: 131/71  Pulse: 65  Resp: 16  Weight: (!) 315 lb (142.9 kg)  Height: 6' (1.829 m)   Body mass index is 42.72 kg/m. Gen: WD/WN, NAD Head: [place]/AT, No temporalis wasting.   Ear/Nose/Throat: Hearing grossly intact, nares w/o erythema or drainage, poor dentition Eyes: PER, EOMI, sclera nonicteric.  Neck: Supple, no masses.  No bruit or JVD.  Pulmonary:  Good air movement, clear to auscultation bilaterally, no use of accessory muscles.  Cardiac: RRR, normal S1, S2, no Murmurs. Vascular: 2+ edema Vessel Right Left  Radial Palpable Palpable  Ulnar Palpable Palpable  Brachial Palpable Palpable  Carotid Palpable Palpable  Femoral Palpable Palpable  Popliteal Palpable Palpable  PT Palpable Palpable  DP Palpable Palpable   Gastrointestinal: soft, non-distended. No guarding/no peritoneal signs.  Musculoskeletal: M/S 5/5 throughout.  No deformity or atrophy.  Neurologic: CN 2-12 intact. Pain and light touch intact in extremities.  Symmetrical.  Speech is fluent. Motor exam as listed above. Psychiatric: Judgment intact, Mood & affect appropriate for pt's clinical situation. Dermatologic: No rashes or ulcers noted.  No changes consistent with cellulitis. Lymph : No Cervical lymphadenopathy, no lichenification or skin changes of chronic lymphedema.  CBC Lab Results  Component Value Date   WBC 8.3 10/23/2012   HGB 12.6 (L) 10/23/2012   HCT 39.4 (L) 10/23/2012   MCV 72 (L) 10/23/2012   PLT 174 10/23/2012    BMET    Component Value Date/Time   NA 140 10/23/2012 0519   K 4.4 10/23/2012 0519   CL 109 (H) 10/23/2012 0519   CO2 24 10/23/2012 0519   GLUCOSE 144 (H) 10/23/2012 0519   BUN 26 (H) 10/23/2012 0519   CREATININE 1.73 (H) 10/23/2012 0519   CALCIUM 9.6 10/23/2012 0519   GFRNONAA 39 (L) 10/23/2012 0519   GFRAA 45 (L) 10/23/2012 0519   CrCl cannot be calculated ([name] most recent lab result is older than the maximum 21 days allowed.).  COAG Lab Results  Component Value Date   INR 1.1 09/04/2011   INR 1.0 09/02/2011    Radiology No results found.  Assessment/Plan 1. Lymphedema  No surgery or intervention at this point in time.    I  have reviewed my discussion with the patient regarding lymphedema and why it  causes symptoms.  Patient will continue wearing graduated compression stockings class 1 (20-30 mmHg) on a daily basis a prescription was given. The patient will  beginning wearing the stockings first thing in the morning and removing them in the evening. The patient is instructed specifically not to sleep in the stockings.   In addition, behavioral modification throughout the day will be continued.  This will include frequent elevation (such as in a recliner), use of over the counter pain medications as needed and exercise such as walking.  I have reviewed systemic causes for chronic edema such as liver, kidney and cardiac etiologies and there does not appear  to be any significant changes in these organ systems over the past year.  The patient is under the impression that these organ systems are all stable and unchanged.    The patient will continue aggressive use of the  lymph pump.  This will continue to improve the edema control and prevent sequela such as ulcers and infections.   The patient will follow-up with me on an annual basis.    2. Chronic venous insufficiency See #1  3. Benign hypertension continue antihypertensives  4. Chronic kidney disease (CKD), stage III (moderate) Monitor renal function avoid nephrotoxic drugs  5. Morbid (severe) obesity due to excess calories ([place]) Nutritional counseling     Hortencia Pilar, MD  05/04/2016 8:16 PM

## 2016-05-09 ENCOUNTER — Ambulatory Visit: Payer: Medicare Other | Admitting: Podiatry

## 2016-07-08 ENCOUNTER — Ambulatory Visit (INDEPENDENT_AMBULATORY_CARE_PROVIDER_SITE_OTHER): Payer: Medicare Other | Admitting: Podiatry

## 2016-07-08 ENCOUNTER — Encounter: Payer: Self-pay | Admitting: Podiatry

## 2016-07-08 DIAGNOSIS — M79609 Pain in unspecified limb: Secondary | ICD-10-CM | POA: Diagnosis not present

## 2016-07-08 DIAGNOSIS — L608 Other nail disorders: Secondary | ICD-10-CM

## 2016-07-08 DIAGNOSIS — L603 Nail dystrophy: Secondary | ICD-10-CM

## 2016-07-08 DIAGNOSIS — B351 Tinea unguium: Secondary | ICD-10-CM | POA: Diagnosis not present

## 2016-07-13 NOTE — Progress Notes (Signed)
   SUBJECTIVE Patient  presents to office today complaining of elongated, thickened nails. Pain while ambulating in shoes. Patient is unable to trim their own nails.   OBJECTIVE General Patient is awake, alert, and oriented x 3 and in no acute distress. Derm Skin is dry and supple bilateral. Negative open lesions or macerations. Remaining integument unremarkable. Nails are tender, long, thickened and dystrophic with subungual debris, consistent with onychomycosis, 1-5 bilateral. No signs of infection noted. Vasc  DP and PT pedal pulses palpable bilaterally. Temperature gradient within normal limits.  Neuro Epicritic and protective threshold sensation diminished bilaterally.  Musculoskeletal Exam No symptomatic pedal deformities noted bilateral. Muscular strength within normal limits.  ASSESSMENT 1. Onychodystrophic nails 1-5 bilateral with hyperkeratosis of nails.  2. Onychomycosis of nail due to dermatophyte bilateral 3. Pain in foot bilateral  PLAN OF CARE 1. Patient evaluated today.  2. Instructed to maintain good pedal hygiene and foot care.  3. Mechanical debridement of nails 1-5 bilaterally performed using a nail nipper. Filed with dremel without incident.  4. Return to clinic in 3 mos.    Brent M. Evans, DPM [facility]  Dr. Brent M. Evans, DPM    [address]                                        [place], [place] [address]                Office [phone]  Fax [phone]      

## 2016-10-13 ENCOUNTER — Ambulatory Visit: Payer: Medicare Other | Admitting: Podiatry

## 2016-10-23 ENCOUNTER — Encounter: Payer: Self-pay | Admitting: Podiatry

## 2016-10-23 ENCOUNTER — Ambulatory Visit (INDEPENDENT_AMBULATORY_CARE_PROVIDER_SITE_OTHER): Payer: Medicare Other | Admitting: Podiatry

## 2016-10-23 DIAGNOSIS — B351 Tinea unguium: Secondary | ICD-10-CM | POA: Diagnosis not present

## 2016-10-23 DIAGNOSIS — M79609 Pain in unspecified limb: Secondary | ICD-10-CM

## 2016-10-23 DIAGNOSIS — L600 Ingrowing nail: Secondary | ICD-10-CM

## 2016-10-23 NOTE — Progress Notes (Signed)
Complaint:  Visit Type: Patient returns to my office for continued preventative foot care services. Complaint: Patient states" my nails have grown long and thick and become painful to walk and wear shoes"  The patient presents for preventative foot care services. No changes to ROS  Podiatric Exam: Vascular: dorsalis pedis and posterior tibial pulses are palpable bilateral. Capillary return is immediate. Temperature gradient is WNL. Skin turgor WNL  Sensorium: Normal Semmes Weinstein monofilament test. Normal tactile sensation bilaterally. Nail Exam: Pt has thick disfigured discolored nails with subungual debris noted bilateral entire nail hallux through fifth toenails.  There is ingrowing lateral border left great toenail Ulcer Exam: There is no evidence of ulcer or pre-ulcerative changes or infection. Orthopedic Exam: Muscle tone and strength are WNL. No limitations in general ROM. No crepitus or effusions noted. Foot type and digits show no abnormalities. Bony prominences are unremarkable. Skin: No Porokeratosis. No infection or ulcers  Diagnosis:  Onychomycosis, , Pain in right toe, pain in left toes  Treatment & Plan Procedures and Treatment: Consent by patient was obtained for treatment procedures. The patient understood the discussion of treatment and procedures well. All questions were answered thoroughly reviewed. Debridement of mycotic and hypertrophic toenails, 1 through 5 bilateral and clearing of subungual debris. No ulceration, no infection noted.  Lateral left was vlleding due to ingrown toenail so neosporin/DSD was applied. Return Visit-Office Procedure: Patient instructed to return to the office for a follow up visit 3 months for continued evaluation and treatment.    Gardiner Barefoot DPM

## 2016-11-06 ENCOUNTER — Encounter (INDEPENDENT_AMBULATORY_CARE_PROVIDER_SITE_OTHER): Payer: Self-pay | Admitting: Vascular Surgery

## 2016-11-06 ENCOUNTER — Ambulatory Visit (INDEPENDENT_AMBULATORY_CARE_PROVIDER_SITE_OTHER): Payer: Medicare Other | Admitting: Vascular Surgery

## 2016-11-06 VITALS — BP 138/74 | HR 62 | Resp 16 | Ht 72.0 in | Wt 311.0 lb

## 2016-11-06 DIAGNOSIS — N183 Chronic kidney disease, stage 3 unspecified: Secondary | ICD-10-CM

## 2016-11-06 DIAGNOSIS — I10 Essential (primary) hypertension: Secondary | ICD-10-CM | POA: Diagnosis not present

## 2016-11-06 DIAGNOSIS — I890 Lymphedema, not elsewhere classified: Secondary | ICD-10-CM

## 2016-11-06 DIAGNOSIS — I872 Venous insufficiency (chronic) (peripheral): Secondary | ICD-10-CM | POA: Diagnosis not present

## 2016-11-06 NOTE — Progress Notes (Signed)
MRN : [id number]  Justin Shannon. is a 76 y.o. (September 30, 1940) male who presents with chief complaint of No chief complaint on file. Marland Kitchen  History of Present Illness: The patient returns to the office for followup evaluation regarding leg swelling.  The swelling has persisted and the pain associated with swelling continues. There have not been any interval development of a ulcerations or wounds.  Since the previous visit the patient has been wearing graduated compression stockings and has noted little if any improvement in the lymphedema. The patient has been using compression routinely morning until night.  The patient also states elevation during the day and exercise is being done too.   No outpatient prescriptions have been marked as taking for the 11/06/16 encounter (Appointment) with Delana Meyer, Dolores Lory, MD.    Past Medical History:  Diagnosis Date  . Arthritis   . Bilateral swelling of feet   . BPH (benign prostatic hyperplasia)   . DVT (deep venous thrombosis) (HCC)    right lower extremity  . History of gout   . Hypertension   . Tendonitis of foot    left    Past Surgical History:  Procedure Laterality Date  . APPENDECTOMY    . HERNIA REPAIR      Social History Social History  Substance Use Topics  . Smoking status: Former Research scientist (life sciences)  . Smokeless tobacco: Never Used     Comment: 25 years ago quit  . Alcohol use No    Family History Family History  Problem Relation Age of Onset  . Arthritis Mother   . Hypertension Mother     Allergies  Allergen Reactions  . Shellfish Allergy      REVIEW OF SYSTEMS (Negative unless checked)  Constitutional: [] Weight loss  [] Fever  [] Chills Cardiac: [] Chest pain   [] Chest pressure   [] Palpitations   [] Shortness of breath when laying flat   [] Shortness of breath with exertion. 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 Genitourinary:  [] Chronic kidney disease   [] Difficult urination  [] Frequent urination   [] Blood in urine Skin:  [] Rashes   [] Ulcers  Psychological:  [] History of anxiety   []  History of major depression.  Physical Examination  There were no vitals filed for this visit. There is no height or weight on file to calculate BMI. Gen: WD/WN, NAD Head: [place]/AT, No temporalis wasting.  Ear/Nose/Throat: Hearing grossly intact, nares w/o erythema or drainage Eyes: PER, EOMI, sclera nonicteric.  Neck: Supple, no large masses.   Pulmonary:  Good air movement, no audible wheezing bilaterally, no use of accessory muscles.  Cardiac: RRR, no JVD Vascular:  Vessel Right Left  Radial Palpable Palpable  Ulnar Palpable Palpable  Brachial Palpable Palpable  Carotid Palpable Palpable  Femoral Palpable Palpable  Popliteal  Palpable Palpable  PT Palpable Palpable  DP Palpable Palpable  Gastrointestinal: Non-distended. No guarding/no peritoneal signs.  Musculoskeletal: M/S 5/5 throughout.  No deformity or atrophy.  Neurologic: CN 2-12 intact. Symmetrical.  Speech is fluent. Motor exam as listed above. Psychiatric: Judgment intact, Mood & affect appropriate for pt's clinical situation. Dermatologic: No rashes or ulcers noted.  No changes consistent with cellulitis. Lymph : No lichenification or skin changes of chronic lymphedema.  CBC Lab Results  Component Value  Date   WBC 8.3 10/23/2012   HGB 12.6 (L) 10/23/2012   HCT 39.4 (L) 10/23/2012   MCV 72 (L) 10/23/2012   PLT 174 10/23/2012    BMET    Component Value Date/Time   NA 140 10/23/2012 0519   K 4.4 10/23/2012 0519   CL 109 (H) 10/23/2012 0519   CO2 24 10/23/2012 0519   GLUCOSE 144 (H) 10/23/2012 0519   BUN 26 (H) 10/23/2012 0519   CREATININE 1.73 (H) 10/23/2012 0519   CALCIUM 9.6 10/23/2012 0519   GFRNONAA 39 (L) 10/23/2012 0519   GFRAA 45 (L) 10/23/2012 0519   CrCl cannot be calculated ([name] most recent lab result is older than the maximum 21 days allowed.).  COAG Lab Results  Component Value Date   INR 1.1 09/04/2011   INR 1.0 09/02/2011    Radiology No results found.  Assessment/Plan 1. Lymphedema No surgery or intervention at this point in time.    I have reviewed my previous discussion with the patient regarding swelling and why it  causes symptoms.  The patient is doing well with compression and will continue wearing graduated compression stockings class 1 (20-30 mmHg) on a daily basis a prescription was given. The patient will  continue wearing the stockings first thing in the morning and removing them in the evening. The patient is instructed specifically not to sleep in the stockings.    In addition, behavioral modification including elevation during the day and exercise will be continued.    Patient should follow-up in 1-2 weeks  2. Chronic venous insufficiency No surgery or intervention at this point in time.    I have had a long discussion with the patient regarding venous insufficiency and why it  causes symptoms. I have discussed with the patient the chronic skin changes that accompany venous insufficiency and the long term sequela such as infection and ulceration.  Patient will begin wearing graduated compression stockings class 1 (20-30 mmHg) or compression wraps on a daily basis a prescription was given. The patient will put the stockings on first thing  in the morning and removing them in the evening. The patient is instructed specifically not to sleep in the stockings.    In addition, behavioral modification including several periods of elevation of the lower extremities during the day will be continued. I have demonstrated that proper elevation is a position with the ankles at heart level.  The patient is instructed to begin routine exercise, especially walking on a daily basis  Following the review of the ultrasound the patient will follow up in 1-2 months to reassess the degree of swelling and the control that graduated compression stockings or compression wraps  is offering.     3. Benign hypertension Continue antihypertensive medications as already ordered, these medications have been reviewed and there are no changes at this time.   4. Chronic kidney disease (CKD), stage III (moderate) Monitor renal function.  Avoid nephrotoxic medications and dehydration.   Continue antihypertensive medications as already ordered, these  medications have been reviewed and there are no changes at this time.     Hortencia Pilar, MD  11/06/2016 1:05 PM

## 2017-01-01 ENCOUNTER — Ambulatory Visit (INDEPENDENT_AMBULATORY_CARE_PROVIDER_SITE_OTHER): Payer: Medicare Other | Admitting: Vascular Surgery

## 2017-01-26 ENCOUNTER — Ambulatory Visit: Payer: Medicare Other | Admitting: Podiatry

## 2017-04-13 ENCOUNTER — Ambulatory Visit (INDEPENDENT_AMBULATORY_CARE_PROVIDER_SITE_OTHER): Payer: Medicare Other | Admitting: Podiatry

## 2017-04-13 ENCOUNTER — Encounter: Payer: Self-pay | Admitting: Podiatry

## 2017-04-13 DIAGNOSIS — M79609 Pain in unspecified limb: Secondary | ICD-10-CM | POA: Diagnosis not present

## 2017-04-13 DIAGNOSIS — M79676 Pain in unspecified toe(s): Secondary | ICD-10-CM

## 2017-04-13 DIAGNOSIS — B351 Tinea unguium: Secondary | ICD-10-CM | POA: Diagnosis not present

## 2017-04-13 NOTE — Progress Notes (Signed)
Complaint:  Visit Type: Patient returns to my office for continued preventative foot care services. Complaint: Patient states" my nails have grown long and thick and become painful to walk and wear shoes"  The patient presents for preventative foot care services. No changes to ROS.  Patient had an ingrown toenail left big toe last visit.  Podiatric Exam: Vascular: dorsalis pedis and posterior tibial pulses are palpable bilateral. Capillary return is immediate. Temperature gradient is WNL. Skin turgor WNL  Sensorium: Normal Semmes Weinstein monofilament test. Normal tactile sensation bilaterally. Nail Exam: Pt has thick disfigured discolored nails with subungual debris noted bilateral entire nail hallux through fifth toenails.  Subungual ulceration due to fungus toenail. Ulcer Exam: There is no evidence of ulcer or pre-ulcerative changes or infection. Orthopedic Exam: Muscle tone and strength are WNL. No limitations in general ROM. No crepitus or effusions noted. Foot type and digits show no abnormalities. Bony prominences are unremarkable. Skin: No Porokeratosis. No infection or ulcers  Diagnosis:  Onychomycosis, , Pain in right toe, pain in left toes,  Subungual ulcer right hallux.  Treatment & Plan Procedures and Treatment: Consent by patient was obtained for treatment procedures.   Debridement of mycotic and hypertrophic toenails, 1 through 5 bilateral and clearing of subungual debris. no infection noted.  Non-infected ulcer subungually right hallux. Neosporin/DSD right hallux nail. Return Visit-Office Procedure: Patient instructed to return to the office for a follow up visit 3 months for continued evaluation and treatment. Re-inforced his need to return every three months since both great toes have been pathologic his last two visits.    Gardiner Barefoot DPM

## 2017-04-15 ENCOUNTER — Ambulatory Visit
Admission: RE | Admit: 2017-04-15 | Discharge: 2017-04-15 | Disposition: A | Discharge status: Home / Self Care | Payer: Medicare Other | Source: Ambulatory Visit | Attending: Physician Assistant | Admitting: Physician Assistant

## 2017-04-15 ENCOUNTER — Other Ambulatory Visit: Payer: Self-pay | Admitting: Physician Assistant

## 2017-04-15 DIAGNOSIS — L97919 Non-pressure chronic ulcer of unspecified part of right lower leg with unspecified severity: Secondary | ICD-10-CM

## 2017-04-15 DIAGNOSIS — I83019 Varicose veins of right lower extremity with ulcer of unspecified site: Secondary | ICD-10-CM | POA: Diagnosis present

## 2017-04-15 DIAGNOSIS — I82411 Acute embolism and thrombosis of right femoral vein: Secondary | ICD-10-CM | POA: Insufficient documentation

## 2017-04-16 ENCOUNTER — Encounter (INDEPENDENT_AMBULATORY_CARE_PROVIDER_SITE_OTHER): Payer: Self-pay

## 2017-04-16 ENCOUNTER — Encounter (INDEPENDENT_AMBULATORY_CARE_PROVIDER_SITE_OTHER): Payer: Self-pay | Admitting: Vascular Surgery

## 2017-04-16 ENCOUNTER — Ambulatory Visit (INDEPENDENT_AMBULATORY_CARE_PROVIDER_SITE_OTHER): Payer: Medicare Other | Admitting: Vascular Surgery

## 2017-04-16 VITALS — BP 147/79 | HR 70 | Resp 16 | Ht 65.0 in | Wt 317.0 lb

## 2017-04-16 DIAGNOSIS — L97909 Non-pressure chronic ulcer of unspecified part of unspecified lower leg with unspecified severity: Secondary | ICD-10-CM | POA: Diagnosis not present

## 2017-04-16 DIAGNOSIS — I83009 Varicose veins of unspecified lower extremity with ulcer of unspecified site: Secondary | ICD-10-CM

## 2017-04-16 DIAGNOSIS — I890 Lymphedema, not elsewhere classified: Secondary | ICD-10-CM

## 2017-04-16 DIAGNOSIS — I872 Venous insufficiency (chronic) (peripheral): Secondary | ICD-10-CM

## 2017-04-16 DIAGNOSIS — I129 Hypertensive chronic kidney disease with stage 1 through stage 4 chronic kidney disease, or unspecified chronic kidney disease: Secondary | ICD-10-CM

## 2017-04-16 DIAGNOSIS — N183 Chronic kidney disease, stage 3 unspecified: Secondary | ICD-10-CM

## 2017-04-19 DIAGNOSIS — L97909 Non-pressure chronic ulcer of unspecified part of unspecified lower leg with unspecified severity: Secondary | ICD-10-CM | POA: Insufficient documentation

## 2017-04-19 DIAGNOSIS — I83009 Varicose veins of unspecified lower extremity with ulcer of unspecified site: Secondary | ICD-10-CM | POA: Insufficient documentation

## 2017-04-19 NOTE — Progress Notes (Signed)
MRN : [id number]  Justin Bart. is a 76 y.o. (07/04/1940) male who presents with chief complaint of  Chief Complaint  Patient presents with  . Follow-up    76yr no studies  .  History of Present Illness: Patient is seen for evaluation of leg pain and swelling associated with new onset ulceration. The patient first noticed the swelling remotely. The swelling is associated with pain and discoloration. The pain and swelling worsens with prolonged dependency and improves with elevation. The pain is unrelated to activity.  The patient notes that in the morning the legs are better but the leg symptoms worsened throughout the course of the day. The patient has also noted a progressive worsening of the discoloration in the ankle and shin area.   The patient notes that an ulcer has developed acutely without specific trauma and since it occurred it has been very slow to heal.  There is a moderate amount of drainage associated with the open area.  The wound is also very painful.  The patient denies claudication symptoms or rest pain symptoms.  The patient denies DJD and LS spine disease.  The patient has not had any past angiography, interventions or vascular surgery.  Elevation makes the leg symptoms better, dependency makes them much worse. The patient denies any recent changes in medications.  The patient has not been wearing graduated compression.  The patient denies a history of DVT or PE. There is no prior history of phlebitis. There is no history of primary lymphedema.  No history of malignancies. No history of trauma or groin or pelvic surgery. There is no history of radiation treatment to the groin or pelvis      Current Meds  Medication Sig  . allopurinol (ZYLOPRIM) 100 MG tablet Take 100 mg by mouth daily.  Marland Kitchen diltiazem (CARDIZEM LA) 240 MG 24 hr tablet Take 240 mg by mouth daily.  Marland Kitchen docusate sodium (COLACE) 100 MG capsule Take 100 mg by mouth daily.  . fexofenadine (ALLEGRA)  180 MG tablet Take 180 mg by mouth daily as needed.  . fluticasone (FLONASE) 50 MCG/ACT nasal spray Place into the nose.  . hydrochlorothiazide (HYDRODIURIL) 25 MG tablet Take 25 mg by mouth daily.  Marland Kitchen levETIRAcetam (KEPPRA) 500 MG tablet Take 1 tablet by mouth two  times daily  . losartan (COZAAR) 100 MG tablet Take 100 mg by mouth daily.  . metoprolol (LOPRESSOR) 50 MG tablet Take 50 mg by mouth daily.  . Multiple Vitamin (ONE-A-DAY MENS PO) Take by mouth daily.    Past Medical History:  Diagnosis Date  . Arthritis   . Bilateral swelling of feet   . BPH (benign prostatic hyperplasia)   . DVT (deep venous thrombosis) (HCC)    right lower extremity  . History of gout   . Hypertension   . Tendonitis of foot    left    Past Surgical History:  Procedure Laterality Date  . APPENDECTOMY    . HERNIA REPAIR      Social History Social History  Substance Use Topics  . Smoking status: Former Research scientist (life sciences)  . Smokeless tobacco: Never Used     Comment: 25 years ago quit  . Alcohol use No    Family History Family History  Problem Relation Age of Onset  . Arthritis Mother   . Hypertension Mother     Allergies  Allergen Reactions  . Shellfish Allergy      REVIEW OF SYSTEMS (Negative unless checked)  Constitutional: [] Weight  loss  [] Fever  [] Chills Cardiac: [] Chest pain   [] Chest pressure   [] Palpitations   [] Shortness of breath when laying flat   [] Shortness of breath with exertion. Vascular:  [] Pain in legs with walking   [] Pain in legs at rest  [x] History of DVT   [] Phlebitis   [x] Swelling in legs   [x] Varicose veins   [x] Non-healing ulcers Pulmonary:   [] Uses home oxygen   [] Productive cough   [] Hemoptysis   [] Wheeze  [] COPD   [] Asthma Neurologic:  [] Dizziness   [] Seizures   [] History of stroke   [] History of TIA  [] Aphasia   [] Vissual changes   [] Weakness or numbness in arm   [] Weakness or numbness in leg Musculoskeletal:   [] Joint swelling   [] Joint pain   [] Low back  pain Hematologic:  [] Easy bruising  [] Easy bleeding   [] Hypercoagulable state   [] Anemic Gastrointestinal:  [] Diarrhea   [] Vomiting  [] Gastroesophageal reflux/heartburn   [] Difficulty swallowing. Genitourinary:  [x] Chronic kidney disease   [] Difficult urination  [] Frequent urination   [] Blood in urine Skin:  [] Rashes   [] Ulcers  Psychological:  [] History of anxiety   []  History of major depression.  Physical Examination  Vitals:   04/16/17 0856  BP: (!) 147/79  Pulse: 70  Resp: 16  Weight: (!) 143.8 kg (317 lb)  Height: 5\' 5"  (1.651 m)   Body mass index is 52.75 kg/m. Gen: WD/WN, NAD Head: [place]/AT, No temporalis wasting.  Ear/Nose/Throat: Hearing grossly intact, nares w/o erythema or drainage Eyes: PER, EOMI, sclera nonicteric.  Neck: Supple, no large masses.   Pulmonary:  Good air movement, no audible wheezing bilaterally, no use of accessory muscles.  Cardiac: RRR, no JVD Vascular: 2-3+ edema of the right leg with severe venous changes of the right leg.  Venous ulcer noted in the ankle area on the right, noninfected Vessel Right Left  Radial Palpable Palpable  PT Palpable Palpable  DP Palpable Palpable  Gastrointestinal: Non-distended. No guarding/no peritoneal signs.  Musculoskeletal: M/S 5/5 throughout.  No deformity or atrophy.  Neurologic: CN 2-12 intact. Symmetrical.  Speech is fluent. Motor exam as listed above. Psychiatric: Judgment intact, Mood & affect appropriate for pt's clinical situation. Dermatologic: Venous stasis dermatitis with ulcers present on the right.  No changes consistent with cellulitis. Lymph : No lichenification or skin changes of chronic lymphedema.  CBC Lab Results  Component Value Date   WBC 8.3 10/23/2012   HGB 12.6 (L) 10/23/2012   HCT 39.4 (L) 10/23/2012   MCV 72 (L) 10/23/2012   PLT 174 10/23/2012    BMET    Component Value Date/Time   NA 140 10/23/2012 0519   K 4.4 10/23/2012 0519   CL 109 (H) 10/23/2012 0519   CO2 24 10/23/2012  0519   GLUCOSE 144 (H) 10/23/2012 0519   BUN 26 (H) 10/23/2012 0519   CREATININE 1.73 (H) 10/23/2012 0519   CALCIUM 9.6 10/23/2012 0519   GFRNONAA 39 (L) 10/23/2012 0519   GFRAA 45 (L) 10/23/2012 0519   CrCl cannot be calculated ([name] most recent lab result is older than the maximum 21 days allowed.).  COAG Lab Results  Component Value Date   INR 1.1 09/04/2011   INR 1.0 09/02/2011    Radiology US Venous Img Lower Unilateral Right  Result Date: 04/15/2017 CLINICAL DATA:  Right lower extremity edema. EXAM: RIGHT LOWER EXTREMITY VENOUS DOPPLER ULTRASOUND TECHNIQUE: Gray-scale sonography with graded compression, as well as color Doppler and duplex ultrasound were performed to evaluate the lower extremity deep  venous systems from the level of the common femoral vein and including the common femoral, femoral, profunda femoral, popliteal and calf veins including the posterior tibial, peroneal and gastrocnemius veins when visible. The superficial great saphenous vein was also interrogated. Spectral Doppler was utilized to evaluate flow at rest and with distal augmentation maneuvers in the common femoral, femoral and popliteal veins. COMPARISON:  None. FINDINGS: Contralateral Common Femoral Vein: Respiratory phasicity is normal and symmetric with the symptomatic side. No evidence of thrombus. Normal compressibility. Common Femoral Vein: No evidence of thrombus. Normal compressibility, respiratory phasicity and response to augmentation. Saphenofemoral Junction: No evidence of thrombus. Normal compressibility and flow on color Doppler imaging. Profunda Femoral Vein: No evidence of thrombus. Normal compressibility and flow on color Doppler imaging. Femoral Vein: Within the distal thigh, the femoral vein demonstrates occlusive thrombus. The mid and proximal femoral venous segments are widely patent. The vein is very small in caliber in this region and a component of chronic thrombus cannot be excluded.  Popliteal Vein: No evidence of thrombus. Normal compressibility, respiratory phasicity and response to augmentation. Calf Veins: No evidence of thrombus. Normal compressibility and flow on color Doppler imaging. Superficial Great Saphenous Vein: No evidence of thrombus. Normal compressibility. Venous Reflux:  None. Other Findings: No evidence of superficial thrombophlebitis or abnormal fluid collection. IMPRESSION: Occlusive thrombus isolated to a segment of the distal femoral vein in the distal thigh. Although this presumably represents acute thrombus, the vein is small in caliber and a component of chronic thrombus cannot be completely excluded. Mid and proximal segments of the femoral vein as well as other deep veins of the right lower extremity are widely patent. Electronically Signed   By: Aletta Edouard M.D.   On: 04/15/2017 11:19    Assessment/Plan 1. Venous ulcer ([place]) No surgery or intervention at this point in time.    I have had a long discussion with the patient regarding venous insufficiency and why it  causes symptoms, specifically venous ulceration . I have discussed with the patient the chronic skin changes that accompany venous insufficiency and the long term sequela such as infection and recurring  ulceration.  Patient will be placed in [employer] which will be changed weekly drainage permitting.  In addition, behavioral modification including several periods of elevation of the lower extremities during the day will be continued. Achieving a position with the ankles at heart level was stressed to the patient  The patient is instructed to begin routine exercise, especially walking on a daily basis  Patient should undergo duplex ultrasound of the venous system to ensure that DVT or reflux is not present.  Following the review of the ultrasound the patient will follow up in one week to reassess the degree of swelling and the control that Unna therapy is offering.   The patient can be  assessed for graduated compression stockings or wraps as well as a Lymph Pump once the ulcers are healed.   2. Chronic venous insufficiency No surgery or intervention at this point in time.    I have had a long discussion with the patient regarding venous insufficiency and why it  causes symptoms, specifically venous ulceration . I have discussed with the patient the chronic skin changes that accompany venous insufficiency and the long term sequela such as infection and recurring  ulceration.  Patient will be placed in [employer] which will be changed weekly drainage permitting.  In addition, behavioral modification including several periods of elevation of the lower extremities during  the day will be continued. Achieving a position with the ankles at heart level was stressed to the patient  The patient is instructed to begin routine exercise, especially walking on a daily basis  Patient should undergo duplex ultrasound of the venous system to ensure that DVT or reflux is not present.  Following the review of the ultrasound the patient will follow up in one week to reassess the degree of swelling and the control that Unna therapy is offering.   The patient can be assessed for graduated compression stockings or wraps as well as a Lymph Pump once the ulcers are healed.   3. Lymphedema  No surgery or intervention at this point in time.    I have reviewed my discussion with the patient regarding lymphedema and why it  causes symptoms.  Patient will continue wearing graduated compression stockings class 1 (20-30 mmHg) on a daily basis a prescription was given. The patient is reminded to put the stockings on first thing in the morning and removing them in the evening. The patient is instructed specifically not to sleep in the stockings.   In addition, behavioral modification throughout the day will be continued.  This will include frequent elevation (such as in a recliner), use of over the counter  pain medications as needed and exercise such as walking.  I have reviewed systemic causes for chronic edema such as liver, kidney and cardiac etiologies and there does not appear to be any significant changes in these organ systems over the past year.  The patient is under the impression that these organ systems are all stable and unchanged.    The patient will continue aggressive use of the  lymph pump.  This will continue to improve the edema control and prevent sequela such as ulcers and infections.   The patient will follow-up with me on an annual basis.    4. Benign hypertension with chronic kidney disease, stage III (HCC) Continue antihypertensive medications as already ordered, these medications have been reviewed and there are no changes at this time.     Hortencia Pilar, MD  04/19/2017 1:08 PM

## 2017-04-23 ENCOUNTER — Ambulatory Visit (INDEPENDENT_AMBULATORY_CARE_PROVIDER_SITE_OTHER): Payer: Medicare Other | Admitting: Vascular Surgery

## 2017-04-23 ENCOUNTER — Encounter (INDEPENDENT_AMBULATORY_CARE_PROVIDER_SITE_OTHER): Payer: Self-pay

## 2017-04-23 VITALS — BP 152/73 | HR 73 | Resp 16 | Wt 317.0 lb

## 2017-04-23 DIAGNOSIS — I872 Venous insufficiency (chronic) (peripheral): Secondary | ICD-10-CM | POA: Diagnosis not present

## 2017-04-23 NOTE — Progress Notes (Signed)
History of Present Illness  There is no documented history at this time  Assessments & Plan   There are no diagnoses linked to this encounter.    Additional instructions  Subjective:  Patient presents with venous ulcer of the Right lower extremity.    Procedure:  3 layer unna wrap was placed Right lower extremity.   Plan:   Follow up in one week.   

## 2017-04-30 ENCOUNTER — Encounter (INDEPENDENT_AMBULATORY_CARE_PROVIDER_SITE_OTHER): Payer: Self-pay | Admitting: Vascular Surgery

## 2017-04-30 ENCOUNTER — Ambulatory Visit (INDEPENDENT_AMBULATORY_CARE_PROVIDER_SITE_OTHER): Payer: Medicare Other | Admitting: Vascular Surgery

## 2017-04-30 VITALS — BP 144/75 | HR 71 | Resp 16 | Ht 70.0 in | Wt 316.0 lb

## 2017-04-30 DIAGNOSIS — L97909 Non-pressure chronic ulcer of unspecified part of unspecified lower leg with unspecified severity: Secondary | ICD-10-CM

## 2017-04-30 DIAGNOSIS — I83009 Varicose veins of unspecified lower extremity with ulcer of unspecified site: Secondary | ICD-10-CM | POA: Diagnosis not present

## 2017-04-30 NOTE — Progress Notes (Signed)
History of Present Illness  There is no documented history at this time  Assessments & Plan   There are no diagnoses linked to this encounter.    Additional instructions  Subjective:  Patient presents with venous ulcer of the Right lower extremity.    Procedure:  3 layer unna wrap was placed Right lower extremity.   Plan:   Follow up in one week.   

## 2017-05-01 ENCOUNTER — Telehealth (INDEPENDENT_AMBULATORY_CARE_PROVIDER_SITE_OTHER): Payer: Self-pay | Admitting: Vascular Surgery

## 2017-05-01 NOTE — Telephone Encounter (Signed)
Can you call the patient and tell him if he can come in around 10:30 this morning we can change the wrap on his leg. He doesn't need to be added to the schedule, he'll just need to come to the window and let us know that he's here. Thanks.

## 2017-05-01 NOTE — Telephone Encounter (Signed)
Pt spouse called and left a voicemail stating that pt needs to come back in the the wrap is to tight. Please advise, thank you!  Call pt @ [phone]

## 2017-05-07 ENCOUNTER — Ambulatory Visit (INDEPENDENT_AMBULATORY_CARE_PROVIDER_SITE_OTHER): Payer: Medicare Other | Admitting: Vascular Surgery

## 2017-05-07 ENCOUNTER — Encounter (INDEPENDENT_AMBULATORY_CARE_PROVIDER_SITE_OTHER): Payer: Self-pay | Admitting: Vascular Surgery

## 2017-05-07 VITALS — HR 70 | Resp 17 | Ht 70.0 in | Wt 312.0 lb

## 2017-05-07 DIAGNOSIS — L97909 Non-pressure chronic ulcer of unspecified part of unspecified lower leg with unspecified severity: Secondary | ICD-10-CM | POA: Diagnosis not present

## 2017-05-07 DIAGNOSIS — I83009 Varicose veins of unspecified lower extremity with ulcer of unspecified site: Secondary | ICD-10-CM | POA: Diagnosis not present

## 2017-05-07 NOTE — Progress Notes (Signed)
History of Present Illness  There is no documented history at this time  Assessments & Plan   There are no diagnoses linked to this encounter.    Additional instructions  Subjective:  Patient presents with venous ulcer of the Right lower extremity.    Procedure:  3 layer unna wrap was placed Right lower extremity.   Plan:   Follow up in one week.   

## 2017-05-11 ENCOUNTER — Encounter (INDEPENDENT_AMBULATORY_CARE_PROVIDER_SITE_OTHER): Payer: Self-pay | Admitting: Vascular Surgery

## 2017-05-18 ENCOUNTER — Encounter (INDEPENDENT_AMBULATORY_CARE_PROVIDER_SITE_OTHER): Payer: Self-pay | Admitting: Vascular Surgery

## 2017-05-18 ENCOUNTER — Ambulatory Visit (INDEPENDENT_AMBULATORY_CARE_PROVIDER_SITE_OTHER): Payer: Medicare Other | Admitting: Vascular Surgery

## 2017-05-18 VITALS — BP 130/72 | HR 72 | Resp 17 | Wt 312.0 lb

## 2017-05-18 DIAGNOSIS — N183 Chronic kidney disease, stage 3 unspecified: Secondary | ICD-10-CM

## 2017-05-18 DIAGNOSIS — I129 Hypertensive chronic kidney disease with stage 1 through stage 4 chronic kidney disease, or unspecified chronic kidney disease: Secondary | ICD-10-CM | POA: Diagnosis not present

## 2017-05-18 DIAGNOSIS — I83009 Varicose veins of unspecified lower extremity with ulcer of unspecified site: Secondary | ICD-10-CM

## 2017-05-18 DIAGNOSIS — L97909 Non-pressure chronic ulcer of unspecified part of unspecified lower leg with unspecified severity: Secondary | ICD-10-CM | POA: Diagnosis not present

## 2017-05-18 DIAGNOSIS — I890 Lymphedema, not elsewhere classified: Secondary | ICD-10-CM | POA: Diagnosis not present

## 2017-05-18 DIAGNOSIS — I872 Venous insufficiency (chronic) (peripheral): Secondary | ICD-10-CM | POA: Diagnosis not present

## 2017-05-18 NOTE — Progress Notes (Signed)
MRN : [id number]  Justin Shannon. is a 76 y.o. (07-Apr-1941) male who presents with chief complaint of  Chief Complaint  Patient presents with  . Follow-up    unna check  .  History of Present Illness: Patient is seen for follow up evaluation of leg pain and swelling associated with venous ulceration. The patient was recently seen here and started on Unna boot therapy.  The swelling abruptly became much worse bilaterally and is associated with pain and discoloration. The pain and swelling worsens with prolonged dependency and improves with elevation.  The patient notes that in the morning the legs are better but the leg symptoms worsened throughout the course of the day. The patient has also noted a progressive worsening of the discoloration in the ankle and shin area.   The patient notes that an ulcer has developed acutely without specific trauma and since it occurred it has been very slow to heal.  There is a moderate amount of drainage associated with the open area.  The wound is also very painful.  The patient notes that they were not able to tolerate the Unna boot and removed it several days ago.  The patient states that they have been elevating as much as possible. The patient denies any recent changes in medications.  The patient denies a history of DVT or PE. There is no prior history of phlebitis. There is no history of primary lymphedema.  No SOB or increased cough.  No sputum production.  No recent episodes of CHF exacerbation.   Current Meds  Medication Sig  . allopurinol (ZYLOPRIM) 100 MG tablet Take 100 mg by mouth daily.  Marland Kitchen aspirin 81 MG tablet Take 81 mg by mouth daily.  Marland Kitchen diltiazem (CARDIZEM LA) 240 MG 24 hr tablet Take 240 mg by mouth daily.  Marland Kitchen docusate sodium (COLACE) 100 MG capsule Take 100 mg by mouth daily.  Marland Kitchen doxycycline (VIBRA-TABS) 100 MG tablet   . fexofenadine (ALLEGRA) 180 MG tablet Take 180 mg by mouth daily as needed.  . fluticasone (FLONASE) 50  MCG/ACT nasal spray Place into the nose.  . hydrochlorothiazide (HYDRODIURIL) 25 MG tablet Take 25 mg by mouth daily.  Marland Kitchen levETIRAcetam (KEPPRA) 500 MG tablet Take 1 tablet by mouth two  times daily  . losartan (COZAAR) 100 MG tablet Take 100 mg by mouth daily.  Marland Kitchen losartan-hydrochlorothiazide (HYZAAR) 100-12.5 MG tablet Take by mouth.  . metoprolol (LOPRESSOR) 50 MG tablet Take 50 mg by mouth daily.  . metoprolol succinate (TOPROL-XL) 50 MG 24 hr tablet Take by mouth.  . Multiple Vitamin (ONE-A-DAY MENS PO) Take by mouth daily.  . Rivaroxaban (XARELTO) 15 MG TABS tablet Take 20 mg by mouth.     Past Medical History:  Diagnosis Date  . Arthritis   . Bilateral swelling of feet   . BPH (benign prostatic hyperplasia)   . DVT (deep venous thrombosis) (HCC)    right lower extremity  . History of gout   . Hypertension   . Tendonitis of foot    left    Past Surgical History:  Procedure Laterality Date  . APPENDECTOMY    . HERNIA REPAIR      Social History Social History   Tobacco Use  . Smoking status: Former Research scientist (life sciences)  . Smokeless tobacco: Never Used  . Tobacco comment: 25 years ago quit  Substance Use Topics  . Alcohol use: No  . Drug use: No    Family History Family History  Problem  Relation Age of Onset  . Arthritis Mother   . Hypertension Mother     Allergies  Allergen Reactions  . Shellfish Allergy      REVIEW OF SYSTEMS (Negative unless checked)  Constitutional: [] Weight loss  [] Fever  [] Chills Cardiac: [] Chest pain   [] Chest pressure   [] Palpitations   [] Shortness of breath when laying flat   [] Shortness of breath with exertion. Vascular:  [] Pain in legs with walking   [] Pain in legs at rest  [] History of DVT   [] Phlebitis   [x] Swelling in legs   [] Varicose veins   [] Non-healing ulcers Pulmonary:   [] Uses home oxygen   [] Productive cough   [] Hemoptysis   [] Wheeze  [] COPD   [] Asthma Neurologic:  [] Dizziness   [] Seizures   [] History of stroke   [] History of TIA   [] Aphasia   [] Vissual changes   [] Weakness or numbness in arm   [] Weakness or numbness in leg Musculoskeletal:   [] Joint swelling   [] Joint pain   [] Low back pain Hematologic:  [] Easy bruising  [] Easy bleeding   [] Hypercoagulable state   [] Anemic Gastrointestinal:  [] Diarrhea   [] Vomiting  [] Gastroesophageal reflux/heartburn   [] Difficulty swallowing. Genitourinary:  [x] Chronic kidney disease   [] Difficult urination  [] Frequent urination   [] Blood in urine Skin:  [] Rashes   [] Ulcers  Psychological:  [] History of anxiety   []  History of major depression.  Physical Examination  Vitals:   05/18/17 0914  BP: 130/72  Pulse: 72  Resp: 17  Weight: (!) 141.5 kg (312 lb)   Body mass index is 44.77 kg/m. Gen: WD/WN, NAD Head: [place]/AT, No temporalis wasting.  Ear/Nose/Throat: Hearing grossly intact, nares w/o erythema or drainage Eyes: PER, EOMI, sclera nonicteric.  Neck: Supple, no large masses.   Pulmonary:  Good air movement, no audible wheezing bilaterally, no use of accessory muscles.  Cardiac: RRR, no JVD Vascular: severe venous insufficiency with right leg ulcer now healed Vessel Right Left  Radial Palpable Palpable  PT Palpable Palpable  DP Palpable Palpable  Gastrointestinal: Non-distended. No guarding/no peritoneal signs.  Musculoskeletal: M/S 5/5 throughout.  No deformity or atrophy.  Neurologic: CN 2-12 intact. Symmetrical.  Speech is fluent. Motor exam as listed above. Psychiatric: Judgment intact, Mood & affect appropriate for pt's clinical situation. Dermatologic: severe venous rashes no ulcers noted.  No changes consistent with cellulitis. Lymph : No lichenification or skin changes of chronic lymphedema.  CBC Lab Results  Component Value Date   WBC 8.3 10/23/2012   HGB 12.6 (L) 10/23/2012   HCT 39.4 (L) 10/23/2012   MCV 72 (L) 10/23/2012   PLT 174 10/23/2012    BMET    Component Value Date/Time   NA 140 10/23/2012 0519   K 4.4 10/23/2012 0519   CL 109 (H)  10/23/2012 0519   CO2 24 10/23/2012 0519   GLUCOSE 144 (H) 10/23/2012 0519   BUN 26 (H) 10/23/2012 0519   CREATININE 1.73 (H) 10/23/2012 0519   CALCIUM 9.6 10/23/2012 0519   GFRNONAA 39 (L) 10/23/2012 0519   GFRAA 45 (L) 10/23/2012 0519   CrCl cannot be calculated ([name] most recent lab result is older than the maximum 21 days allowed.).  COAG Lab Results  Component Value Date   INR 1.1 09/04/2011   INR 1.0 09/02/2011    Radiology No results found.    Assessment/Plan 1. Venous ulcer ([place]) Now healed  No surgery or intervention at this point in time.    I have had a long discussion with the  patient regarding venous insufficiency and why it  causes symptoms. I have discussed with the patient the chronic skin changes that accompany venous insufficiency and the long term sequela such as infection and ulceration.  Patient will begin wearing graduated compression stockings class 1 (20-30 mmHg) or compression wraps on a daily basis a prescription was given. The patient will put the stockings on first thing in the morning and removing them in the evening. The patient is instructed specifically not to sleep in the stockings.    In addition, behavioral modification including several periods of elevation of the lower extremities during the day will be continued. I have demonstrated that proper elevation is a position with the ankles at heart level.  The patient is instructed to begin routine exercise, especially walking on a daily basis  Following the review of the ultrasound the patient will follow up in 6 months to reassess the degree of swelling and the control that graduated compression stockings or compression wraps  is offering.   The patient can be assessed for a Lymph Pump at that time  2. Chronic venous insufficiency No surgery or intervention at this point in time.    I have had a long discussion with the patient regarding venous insufficiency and why it  causes symptoms. I  have discussed with the patient the chronic skin changes that accompany venous insufficiency and the long term sequela such as infection and ulceration.  Patient will begin wearing graduated compression stockings class 1 (20-30 mmHg) or compression wraps on a daily basis a prescription was given. The patient will put the stockings on first thing in the morning and removing them in the evening. The patient is instructed specifically not to sleep in the stockings.    In addition, behavioral modification including several periods of elevation of the lower extremities during the day will be continued. I have demonstrated that proper elevation is a position with the ankles at heart level.  The patient is instructed to begin routine exercise, especially walking on a daily basis  Following the review of the ultrasound the patient will follow up in 6 months to reassess the degree of swelling and the control that graduated compression stockings or compression wraps  is offering.   The patient can be assessed for a Lymph Pump at that time  3. Benign hypertension with chronic kidney disease, stage III (HCC) Continue antihypertensive medications as already ordered, these medications have been reviewed and there are no changes at this time.   4. Lymphedema Continue compression and elevation  Daily walking as exercise was recommended    Justin Pilar, MD  05/18/2017 9:51 AM

## 2017-05-20 ENCOUNTER — Ambulatory Visit: Payer: PRIVATE HEALTH INSURANCE | Admitting: Dietician

## 2017-06-03 ENCOUNTER — Encounter: Payer: Medicare Other | Attending: Internal Medicine | Admitting: Dietician

## 2017-06-03 ENCOUNTER — Encounter: Payer: Self-pay | Admitting: Dietician

## 2017-06-03 DIAGNOSIS — Z713 Dietary counseling and surveillance: Secondary | ICD-10-CM | POA: Insufficient documentation

## 2017-06-03 DIAGNOSIS — I129 Hypertensive chronic kidney disease with stage 1 through stage 4 chronic kidney disease, or unspecified chronic kidney disease: Secondary | ICD-10-CM | POA: Insufficient documentation

## 2017-06-03 DIAGNOSIS — N183 Chronic kidney disease, stage 3 unspecified: Secondary | ICD-10-CM

## 2017-06-03 DIAGNOSIS — I10 Essential (primary) hypertension: Secondary | ICD-10-CM

## 2017-06-03 DIAGNOSIS — Z6841 Body Mass Index (BMI) 40.0 and over, adult: Secondary | ICD-10-CM | POA: Insufficient documentation

## 2017-06-03 NOTE — Progress Notes (Signed)
Medical Nutrition Therapy: Visit start time: 9758  end time: 1145  Assessment:  Diagnosis: obesity, HTN, CKD Stage III Past medical history: sleep apnea, gout Psychosocial issues/ stress concerns: none Preferred learning method:  . Auditory   Current weight: 313.3lbs with jacket and shoes  Height: 5'10" Medications, supplements: reconciled list in medical record  Progress and evaluation: Patient is limiting added salt as well as use of hot sauce. He reports some gradual weight loss, about 7-8lbs by decreasing food portions and limiting some high calorie foods. He states much of his weight gain has happened since having knee issues and becoming less active. His family is very supportive of him making healthy lifestyle changes; he and his wife both mention having a close-knit, loving family.   Physical activity: limited due to knee pain; wife has ordered stationary bike; daughter encourages patient to move during the day.   Dietary Intake:  Usual eating pattern includes 2-3 meals and 1-2 snacks per day. Dining out frequency: 0-1 meals per week; orders small plate and eats vegetables.  Breakfast: toast and 1 egg, or pkg crackers to take meds (xarelto) Snack: none Lunch: sandwich  Snack: peanuts, mixed nuts, rarely potato chips, rarely 1-2 cookies or 1 slice cake Supper: vegetable (collards with kale; green beans, cabbage, broccoli, zucchini, peas); limits rice, some potatoes; baked meats, fried fish 1x a week, meatloaf, pork chop;  Snack: none Beverages: water, black coffee, rarely ginger ale  Nutrition Care Education: Topics covered: HTN, CKD, weight management Basic nutrition: basic food groups, appropriate nutrient balance, appropriate meal and snack schedule, general nutrition guidelines    Weight control: benefits of weight control, importance of low fat and low sugar foods, portion control -- provided guidance on appropriate portions for patient's needs; role of exercise and options  for exercise with knee problems.  Advanced nutrition:  food label reading for sodium Hypertension:  importance of controlling BP, identifying high sodium foods, identifying food sources of, potassium, magnesium CKD: Importance of controlling sodium intake and gave guidance for sodium intake goals; avoidance of large protein portions  Nutritional Diagnosis:  [place]-2.2 Altered nutrition-related laboratory As related to chronic kidney disease.  As evidenced by GFR 36. [place]-3.3 Overweight/obesity As related to excess calories, inactivity due to knee pain.  As evidenced by BMI 44.8, patient report of diet history.  Intervention: Instruction as noted above.   Patient has made significant healthy diet changes, and his supported by close family.   Set goals with input from patient.    No follow-up scheduled at this time, patient and wife will schedule later if needed.  Education Materials given:  . Plate Planner with food lists . Following a Low-Sodium Diet . Goals/ instructions  Learner/ who was taught:  . Patient  . Spouse/ partner  Level of understanding: Marland Kitchen Verbalizes/ demonstrates competency  Demonstrated degree of understanding via:   Teach back Learning barriers: . None  Willingness to learn/ readiness for change: . Eager, change in progress  Monitoring and Evaluation:  Dietary intake, exercise, renal function, BP, and body weight      follow up: prn

## 2017-06-03 NOTE — Patient Instructions (Signed)
   Keep up your healthy food choices, great job!  Continue to control food portions: 1 cup of starchy food, palm-size protein food, and generous portions of "free" vegetables will keep calories in control.  Limit sodium to 500-600mg  with each meal on average. (1500-2000mg  per day).  Start some light exercise while avoiding strain on your knees, such as exercise bike.

## 2017-06-11 DIAGNOSIS — E119 Type 2 diabetes mellitus without complications: Secondary | ICD-10-CM | POA: Insufficient documentation

## 2017-06-11 DIAGNOSIS — E1122 Type 2 diabetes mellitus with diabetic chronic kidney disease: Secondary | ICD-10-CM | POA: Insufficient documentation

## 2017-06-11 DIAGNOSIS — N184 Chronic kidney disease, stage 4 (severe): Secondary | ICD-10-CM | POA: Insufficient documentation

## 2017-06-25 ENCOUNTER — Encounter (INDEPENDENT_AMBULATORY_CARE_PROVIDER_SITE_OTHER): Payer: Self-pay | Admitting: Vascular Surgery

## 2017-06-25 ENCOUNTER — Ambulatory Visit (INDEPENDENT_AMBULATORY_CARE_PROVIDER_SITE_OTHER): Payer: Medicare Other | Admitting: Vascular Surgery

## 2017-06-25 VITALS — BP 143/70 | HR 81 | Resp 17 | Wt 312.0 lb

## 2017-06-25 DIAGNOSIS — I83009 Varicose veins of unspecified lower extremity with ulcer of unspecified site: Secondary | ICD-10-CM

## 2017-06-25 DIAGNOSIS — I890 Lymphedema, not elsewhere classified: Secondary | ICD-10-CM | POA: Diagnosis not present

## 2017-06-25 DIAGNOSIS — L97909 Non-pressure chronic ulcer of unspecified part of unspecified lower leg with unspecified severity: Secondary | ICD-10-CM

## 2017-06-26 ENCOUNTER — Encounter (INDEPENDENT_AMBULATORY_CARE_PROVIDER_SITE_OTHER): Payer: Self-pay | Admitting: Vascular Surgery

## 2017-06-26 NOTE — Progress Notes (Signed)
Subjective:    [name], male    DOB: 1941/02/20, 77 y.o.   MRN: [id number] Chief Complaint  Patient presents with  . Follow-up    right leg open wound   Patient presents with a one-week worsening of a blister which is now turned into an ulceration located to the front of the right calf.  The patient was seen with his wife.  The patient endorses engaging in conservative therapy including wearing medical grade 1 compression stockings elevating his legs and using his lymphedema pump at least 1 hour a day.  The patient was concerned this blister would worsen and this is what caused him to seek medical attention in our office.  The patient denies any pain to the right lower extremity.  The patient denies any fever, nausea or vomiting.   Review of Systems  Constitutional: Negative.   HENT: Negative.   Eyes: Negative.   Respiratory: Negative.   Cardiovascular: Positive for leg swelling.       Right calf ulceration  Gastrointestinal: Negative.   Endocrine: Negative.   Genitourinary: Negative.   Musculoskeletal: Negative.   Skin: Negative.   Allergic/Immunologic: Negative.   Neurological: Negative.   Hematological: Negative.   Psychiatric/Behavioral: Negative.       Objective:   Physical Exam  Constitutional: He is oriented to person, place, and time. He appears well-developed and well-nourished. No distress.  HENT:  Head: Normocephalic and atraumatic.  Eyes: Conjunctivae are normal. Pupils are equal, round, and reactive to light.  Neck: Normal range of motion.  Cardiovascular: Normal rate, regular rhythm, normal heart sounds and intact distal pulses.  Pulses:      Radial pulses are 2+ on the right side, and 2+ on the left side.  To palpate pedal pulses due to edema and body habitus  Pulmonary/Chest: Effort normal and breath sounds normal.  Musculoskeletal: Normal range of motion. He exhibits edema (Mild to moderate nonpitting edema noted bilaterally).    Neurological: He is alert and oriented to person, place, and time.  Skin: He is not diaphoretic.  Front of right shin: 2 cm x 2 cm shallow noninfected with no drainage noted no cellulitis noted ulceration.  Psychiatric: He has a normal mood and affect. His behavior is normal. Judgment and thought content normal.  Vitals reviewed.  BP (!) 143/70 (BP Location: Right Arm)   Pulse 81   Resp 17   Wt (!) 312 lb (141.5 kg)   BMI 44.77 kg/m   Past Medical History:  Diagnosis Date  . Arthritis   . Bilateral swelling of feet   . BPH (benign prostatic hyperplasia)   . DVT (deep venous thrombosis) (HCC)    right lower extremity  . History of gout   . Hypertension   . Tendonitis of foot    left   Social History   Socioeconomic History  . Marital status: Married    Spouse name: Not on file  . Number of children: Not on file  . Years of education: Not on file  . Highest education level: Not on file  Social Needs  . Financial resource strain: Not on file  . Food insecurity - worry: Not on file  . Food insecurity - inability: Not on file  . Transportation needs - medical: Not on file  . Transportation needs - non-medical: Not on file  Occupational History  . Not on file  Tobacco Use  . Smoking status: Former Research scientist (life sciences)  . Smokeless tobacco: Never Used  .  Tobacco comment: 25 years ago quit  Substance and Sexual Activity  . Alcohol use: No  . Drug use: No  . Sexual activity: Not on file  Other Topics Concern  . Not on file  Social History Narrative  . Not on file   Past Surgical History:  Procedure Laterality Date  . APPENDECTOMY    . HERNIA REPAIR     Family History  Problem Relation Age of Onset  . Arthritis Mother   . Hypertension Mother    Allergies  Allergen Reactions  . Shellfish Allergy       Assessment & Plan:  Patient presents with a one-week worsening of a blister which is now turned into an ulceration located to the front of the right calf.  The patient was  seen with his wife.  The patient endorses engaging in conservative therapy including wearing medical grade 1 compression stockings elevating his legs and using his lymphedema pump at least 1 hour a day.  The patient was concerned this blister would worsen and this is what caused him to seek medical attention in our office.  The patient denies any pain to the right lower extremity.  The patient denies any fever, nausea or vomiting.  1. Lymphedema - Stable As below  2. Venous ulcer ([place]) - New Recommend right lower extremity 3 layer zinc oxide Unna wrap changed weekly for approximately 4 months to control edema and assist in the healing of the patient's ulceration The patient is to continue elevating his legs heart level or higher Patient was encouraged to use his lymphedema pump at least twice a day for an hour each time Patient is to follow-up in 1 month for a wound check  Current Outpatient Medications on File Prior to Visit  Medication Sig Dispense Refill  . allopurinol (ZYLOPRIM) 100 MG tablet Take 100 mg by mouth daily.    Marland Kitchen aspirin 81 MG tablet Take 81 mg by mouth daily.    Marland Kitchen diltiazem (CARDIZEM LA) 240 MG 24 hr tablet Take 240 mg by mouth daily.    Marland Kitchen docusate sodium (COLACE) 100 MG capsule Take 100 mg by mouth daily.    Marland Kitchen doxycycline (VIBRA-TABS) 100 MG tablet     . fexofenadine (ALLEGRA) 180 MG tablet Take 180 mg by mouth daily as needed.    . fluticasone (FLONASE) 50 MCG/ACT nasal spray Place into the nose.    . hydrochlorothiazide (HYDRODIURIL) 25 MG tablet Take 25 mg by mouth daily.    Marland Kitchen levETIRAcetam (KEPPRA) 500 MG tablet Take 1 tablet by mouth two  times daily    . losartan (COZAAR) 100 MG tablet Take 100 mg by mouth daily.    . metoprolol (LOPRESSOR) 50 MG tablet Take 50 mg by mouth daily.    . Multiple Vitamin (ONE-A-DAY MENS PO) Take by mouth daily.    . Rivaroxaban (XARELTO) 15 MG TABS tablet Take 20 mg by mouth.      No current facility-administered medications on file  prior to visit.    There are no Patient Instructions on file for this visit. No Follow-up on file.  Treysean Petruzzi A Tobin Cadiente, PA-C

## 2017-07-03 ENCOUNTER — Encounter (INDEPENDENT_AMBULATORY_CARE_PROVIDER_SITE_OTHER): Payer: Self-pay

## 2017-07-03 ENCOUNTER — Ambulatory Visit (INDEPENDENT_AMBULATORY_CARE_PROVIDER_SITE_OTHER): Payer: Medicare Other | Admitting: Vascular Surgery

## 2017-07-03 VITALS — BP 134/66 | HR 67 | Resp 17 | Wt 309.0 lb

## 2017-07-03 DIAGNOSIS — I890 Lymphedema, not elsewhere classified: Secondary | ICD-10-CM

## 2017-07-03 NOTE — Progress Notes (Signed)
History of Present Illness  There is no documented history at this time  Assessments & Plan   There are no diagnoses linked to this encounter.    Additional instructions  Subjective:  Patient presents with venous ulcer of the Right lower extremity.    Procedure:  3 layer unna wrap was placed Right lower extremity.   Plan:   Follow up in one week.   

## 2017-07-09 ENCOUNTER — Encounter (INDEPENDENT_AMBULATORY_CARE_PROVIDER_SITE_OTHER): Payer: Self-pay

## 2017-07-09 ENCOUNTER — Ambulatory Visit (INDEPENDENT_AMBULATORY_CARE_PROVIDER_SITE_OTHER): Payer: Medicare Other | Admitting: Vascular Surgery

## 2017-07-09 VITALS — BP 135/71 | HR 78 | Resp 17 | Ht 70.0 in | Wt 313.0 lb

## 2017-07-09 DIAGNOSIS — L97909 Non-pressure chronic ulcer of unspecified part of unspecified lower leg with unspecified severity: Secondary | ICD-10-CM

## 2017-07-09 DIAGNOSIS — I83009 Varicose veins of unspecified lower extremity with ulcer of unspecified site: Secondary | ICD-10-CM | POA: Diagnosis not present

## 2017-07-09 NOTE — Progress Notes (Signed)
History of Present Illness  There is no documented history at this time  Assessments & Plan   There are no diagnoses linked to this encounter.    Additional instructions  Subjective:  Patient presents with venous ulcer of the Right lower extremity.    Procedure:  3 layer unna wrap was placed Right lower extremity.   Plan:   Follow up in one week.   

## 2017-07-16 ENCOUNTER — Ambulatory Visit: Payer: Medicare Other | Admitting: Podiatry

## 2017-07-16 ENCOUNTER — Encounter: Payer: Self-pay | Admitting: Podiatry

## 2017-07-16 DIAGNOSIS — B351 Tinea unguium: Secondary | ICD-10-CM

## 2017-07-16 DIAGNOSIS — M79609 Pain in unspecified limb: Principal | ICD-10-CM

## 2017-07-16 DIAGNOSIS — M79676 Pain in unspecified toe(s): Secondary | ICD-10-CM | POA: Diagnosis not present

## 2017-07-16 NOTE — Progress Notes (Signed)
Complaint:  Visit Type: Patient returns to my office for continued preventative foot care services. Complaint: Patient states" my nails have grown long and thick and become painful to walk and wear shoes"  The patient presents for preventative foot care services. No changes to ROS.  Patient is wearing unna boot right foot/ankle for ulcer right lower leg.  Podiatric Exam: Vascular: dorsalis pedis and posterior tibial pulses are palpable bilateral. Capillary return is immediate. Temperature gradient is WNL. Skin turgor WNL  Sensorium: Normal Semmes Weinstein monofilament test. Normal tactile sensation bilaterally. Nail Exam: Pt has thick disfigured discolored nails with subungual debris noted bilateral entire nail hallux through fifth toenails.  Subungual ulceration due to fungus toenail. Ulcer Exam: There is no evidence of ulcer or pre-ulcerative changes or infection. Orthopedic Exam: Muscle tone and strength are WNL. No limitations in general ROM. No crepitus or effusions noted. Foot type and digits show no abnormalities. Bony prominences are unremarkable. Skin: No Porokeratosis. No infection or ulcers  Diagnosis:  Onychomycosis, , Pain in right toe, pain in left toes,  Subungual ulcer right hallux.  Treatment & Plan Procedures and Treatment: Consent by patient was obtained for treatment procedures.   Debridement of mycotic and hypertrophic toenails, 1 through 5 bilateral and clearing of subungual debris. no infection noted.     Return Visit-Office Procedure: Patient instructed to return to the office for a follow up visit 3 months for continued evaluation and treatment.    Gardiner Barefoot DPM

## 2017-07-17 ENCOUNTER — Ambulatory Visit (INDEPENDENT_AMBULATORY_CARE_PROVIDER_SITE_OTHER): Payer: Medicare Other | Admitting: Vascular Surgery

## 2017-07-17 ENCOUNTER — Encounter (INDEPENDENT_AMBULATORY_CARE_PROVIDER_SITE_OTHER): Payer: Self-pay

## 2017-07-17 VITALS — BP 128/59 | HR 62 | Resp 16 | Wt 311.0 lb

## 2017-07-17 DIAGNOSIS — L97909 Non-pressure chronic ulcer of unspecified part of unspecified lower leg with unspecified severity: Secondary | ICD-10-CM

## 2017-07-17 DIAGNOSIS — I83009 Varicose veins of unspecified lower extremity with ulcer of unspecified site: Secondary | ICD-10-CM

## 2017-07-17 DIAGNOSIS — I890 Lymphedema, not elsewhere classified: Secondary | ICD-10-CM

## 2017-07-17 DIAGNOSIS — I872 Venous insufficiency (chronic) (peripheral): Secondary | ICD-10-CM | POA: Diagnosis not present

## 2017-07-17 NOTE — Progress Notes (Signed)
History of Present Illness  There is no documented history at this time  Assessments & Plan   There are no diagnoses linked to this encounter.    Additional instructions  Subjective:  Patient presents with venous ulcer of the Right lower extremity.    Procedure:  3 layer unna wrap was placed Right lower extremity.   Plan:   Follow up in one week.   

## 2017-07-23 ENCOUNTER — Encounter (INDEPENDENT_AMBULATORY_CARE_PROVIDER_SITE_OTHER): Payer: Self-pay | Admitting: Vascular Surgery

## 2017-07-23 ENCOUNTER — Ambulatory Visit (INDEPENDENT_AMBULATORY_CARE_PROVIDER_SITE_OTHER): Payer: Medicare Other | Admitting: Vascular Surgery

## 2017-07-23 VITALS — BP 149/74 | HR 73 | Resp 18 | Ht 71.0 in | Wt 311.0 lb

## 2017-07-23 DIAGNOSIS — I83009 Varicose veins of unspecified lower extremity with ulcer of unspecified site: Secondary | ICD-10-CM

## 2017-07-23 DIAGNOSIS — I872 Venous insufficiency (chronic) (peripheral): Secondary | ICD-10-CM

## 2017-07-23 DIAGNOSIS — L97909 Non-pressure chronic ulcer of unspecified part of unspecified lower leg with unspecified severity: Secondary | ICD-10-CM | POA: Diagnosis not present

## 2017-07-23 NOTE — Progress Notes (Signed)
Subjective:    [name], male    DOB: Oct 22, 1940, 77 y.o.   MRN: [id number] Chief Complaint  Patient presents with  . Follow-up    Unna check   Patient presents for a monthly lymphedema/venous ulcer follow-up.  The patient presents today with some improvement to his venous ulcer and edema however they are still present.  Patient was seen with his wife.  The patient states he has been elevating his legs heart level or higher and using his lymphedema pump at least once a day for an hour.  The patient denies any new ulcer formation.  The patient denies any redness to the right lower extremity.  The patient denies any fever, nausea or vomiting.   Review of Systems  Constitutional: Negative.   HENT: Negative.   Eyes: Negative.   Respiratory: Negative.   Cardiovascular: Positive for leg swelling.  Gastrointestinal: Negative.   Endocrine: Negative.   Genitourinary: Negative.   Musculoskeletal: Negative.   Skin: Positive for wound.  Allergic/Immunologic: Negative.   Neurological: Negative.   Hematological: Negative.   Psychiatric/Behavioral: Negative.       Objective:   Physical Exam  Constitutional: He is oriented to person, place, and time. He appears well-developed and well-nourished. No distress.  HENT:  Head: Normocephalic and atraumatic.  Eyes: Conjunctivae are normal. Pupils are equal, round, and reactive to light.  Neck: Normal range of motion.  Cardiovascular: Normal rate, regular rhythm, normal heart sounds and intact distal pulses.  Pulses:      Radial pulses are 2+ on the right side, and 2+ on the left side.  Hard to palpate pedal pulses due to edema and body habitus however his bilateral feet are warm  Pulmonary/Chest: Effort normal and breath sounds normal.  Musculoskeletal: Normal range of motion. He exhibits edema (Mild to moderate nonpitting edema noted to the right lower extremity.  Mild edema to the left lower extremity).  Neurological: He is  alert and oriented to person, place, and time.  Skin: He is not diaphoretic.  Right medial calf: Shallow noninfected ulceration noted.  There is no infection.  There is no drainage.  There is no surrounding erythema.  There is no cellulitis to the leg.  Psychiatric: He has a normal mood and affect. His behavior is normal. Judgment and thought content normal.  Vitals reviewed.  BP (!) 149/74 (BP Location: Right Arm, Patient Position: Sitting)   Pulse 73   Resp 18   Ht 5\' 11"  (1.803 m)   Wt (!) 311 lb (141.1 kg)   BMI 43.38 kg/m   Past Medical History:  Diagnosis Date  . Arthritis   . Bilateral swelling of feet   . BPH (benign prostatic hyperplasia)   . DVT (deep venous thrombosis) (HCC)    right lower extremity  . History of gout   . Hypertension   . Tendonitis of foot    left   Social History   Socioeconomic History  . Marital status: Married    Spouse name: Not on file  . Number of children: Not on file  . Years of education: Not on file  . Highest education level: Not on file  Social Needs  . Financial resource strain: Not on file  . Food insecurity - worry: Not on file  . Food insecurity - inability: Not on file  . Transportation needs - medical: Not on file  . Transportation needs - non-medical: Not on file  Occupational History  . Not on  file  Tobacco Use  . Smoking status: Former Research scientist (life sciences)  . Smokeless tobacco: Never Used  . Tobacco comment: 25 years ago quit  Substance and Sexual Activity  . Alcohol use: No  . Drug use: No  . Sexual activity: Not on file  Other Topics Concern  . Not on file  Social History Narrative  . Not on file   Past Surgical History:  Procedure Laterality Date  . APPENDECTOMY    . HERNIA REPAIR     Family History  Problem Relation Age of Onset  . Arthritis Mother   . Hypertension Mother    Allergies  Allergen Reactions  . Shellfish Allergy       Assessment & Plan:  Patient presents for a monthly lymphedema/venous ulcer  follow-up.  The patient presents today with some improvement to his venous ulcer and edema however they are still present.  Patient was seen with his wife.  The patient states he has been elevating his legs heart level or higher and using his lymphedema pump at least once a day for an hour.  The patient denies any new ulcer formation.  The patient denies any redness to the right lower extremity.  The patient denies any fever, nausea or vomiting.  1. Venous ulcer ([place]) - Stable There is improvement to the ulceration however is not completely healed Recommend another month of right lower extremity 3 layer zinc oxide Unna wraps until the ulceration is completely healed The patient is to continue to elevate his legs heart level or higher as much as possible The patient is to use lymphedema pump at least twice a day for an hour each time The patient is to follow-up in 1 month  2. Chronic venous insufficiency - Stable As above  Current Outpatient Medications on File Prior to Visit  Medication Sig Dispense Refill  . allopurinol (ZYLOPRIM) 100 MG tablet Take 100 mg by mouth daily.    Marland Kitchen aspirin 81 MG tablet Take 81 mg by mouth daily.    Marland Kitchen diltiazem (CARDIZEM LA) 240 MG 24 hr tablet Take 240 mg by mouth daily.    Marland Kitchen docusate sodium (COLACE) 100 MG capsule Take 100 mg by mouth daily.    Marland Kitchen doxycycline (VIBRA-TABS) 100 MG tablet     . fexofenadine (ALLEGRA) 180 MG tablet Take 180 mg by mouth daily as needed.    . fluticasone (FLONASE) 50 MCG/ACT nasal spray Place into the nose.    . furosemide (LASIX) 40 MG tablet     . hydrochlorothiazide (HYDRODIURIL) 25 MG tablet Take 25 mg by mouth daily.    Marland Kitchen levETIRAcetam (KEPPRA) 500 MG tablet Take 1 tablet by mouth two  times daily    . losartan (COZAAR) 100 MG tablet Take 100 mg by mouth daily.    Marland Kitchen losartan-hydrochlorothiazide (HYZAAR) 100-12.5 MG tablet     . metoprolol (LOPRESSOR) 50 MG tablet Take 50 mg by mouth daily.    . Multiple Vitamin (ONE-A-DAY MENS  PO) Take by mouth daily.    . Rivaroxaban (XARELTO) 15 MG TABS tablet Take 20 mg by mouth.      No current facility-administered medications on file prior to visit.    There are no Patient Instructions on file for this visit. No Follow-up on file.  Thermon Zulauf A Alek Borges, PA-C

## 2017-07-30 ENCOUNTER — Encounter (INDEPENDENT_AMBULATORY_CARE_PROVIDER_SITE_OTHER): Payer: Self-pay

## 2017-07-30 ENCOUNTER — Ambulatory Visit (INDEPENDENT_AMBULATORY_CARE_PROVIDER_SITE_OTHER): Payer: Medicare Other | Admitting: Vascular Surgery

## 2017-07-30 VITALS — BP 142/72 | HR 66 | Resp 17 | Wt 308.6 lb

## 2017-07-30 DIAGNOSIS — I890 Lymphedema, not elsewhere classified: Secondary | ICD-10-CM | POA: Diagnosis not present

## 2017-07-30 NOTE — Progress Notes (Signed)
History of Present Illness  There is no documented history at this time  Assessments & Plan   There are no diagnoses linked to this encounter.    Additional instructions  Subjective:  Patient presents with venous ulcer of the Right lower extremity.    Procedure:  3 layer unna wrap was placed Right lower extremity.   Plan:   Follow up in one week.   

## 2017-08-06 ENCOUNTER — Ambulatory Visit (INDEPENDENT_AMBULATORY_CARE_PROVIDER_SITE_OTHER): Payer: Medicare Other | Admitting: Vascular Surgery

## 2017-08-06 ENCOUNTER — Encounter (INDEPENDENT_AMBULATORY_CARE_PROVIDER_SITE_OTHER): Payer: Self-pay

## 2017-08-06 VITALS — BP 130/60 | HR 69 | Resp 17 | Wt 310.0 lb

## 2017-08-06 DIAGNOSIS — I872 Venous insufficiency (chronic) (peripheral): Secondary | ICD-10-CM | POA: Diagnosis not present

## 2017-08-06 NOTE — Progress Notes (Signed)
History of Present Illness  There is no documented history at this time  Assessments & Plan   There are no diagnoses linked to this encounter.    Additional instructions  Subjective:  Patient presents with venous ulcer of the Right lower extremity.    Procedure:  3 layer unna wrap was placed Right lower extremity.   Plan:   Follow up in one week.   

## 2017-08-13 ENCOUNTER — Ambulatory Visit (INDEPENDENT_AMBULATORY_CARE_PROVIDER_SITE_OTHER): Payer: Medicare Other | Admitting: Vascular Surgery

## 2017-08-13 ENCOUNTER — Encounter (INDEPENDENT_AMBULATORY_CARE_PROVIDER_SITE_OTHER): Payer: Self-pay

## 2017-08-13 VITALS — BP 140/69 | HR 69 | Resp 17 | Ht 70.0 in | Wt 310.0 lb

## 2017-08-13 DIAGNOSIS — I872 Venous insufficiency (chronic) (peripheral): Secondary | ICD-10-CM

## 2017-08-13 NOTE — Progress Notes (Signed)
History of Present Illness  There is no documented history at this time  Assessments & Plan   There are no diagnoses linked to this encounter.    Additional instructions  Subjective:  Patient presents with venous ulcer of the Right lower extremity.    Procedure:  3 layer unna wrap was placed Right lower extremity.   Plan:   Follow up in one week.   

## 2017-08-20 ENCOUNTER — Ambulatory Visit (INDEPENDENT_AMBULATORY_CARE_PROVIDER_SITE_OTHER): Payer: Medicare Other | Admitting: Vascular Surgery

## 2017-08-20 ENCOUNTER — Encounter (INDEPENDENT_AMBULATORY_CARE_PROVIDER_SITE_OTHER): Payer: Self-pay | Admitting: Vascular Surgery

## 2017-08-20 VITALS — BP 123/68 | HR 71 | Resp 17 | Ht 72.0 in | Wt 306.0 lb

## 2017-08-20 DIAGNOSIS — L97909 Non-pressure chronic ulcer of unspecified part of unspecified lower leg with unspecified severity: Secondary | ICD-10-CM

## 2017-08-20 DIAGNOSIS — I83009 Varicose veins of unspecified lower extremity with ulcer of unspecified site: Secondary | ICD-10-CM

## 2017-08-20 DIAGNOSIS — I890 Lymphedema, not elsewhere classified: Secondary | ICD-10-CM | POA: Diagnosis not present

## 2017-08-20 NOTE — Progress Notes (Signed)
Subjective:    Patient ID: Justin Shannon., male    DOB: 1940/12/05, 77 y.o.   MRN: [id number] Chief Complaint  Patient presents with  . Follow-up    Unna boot check   Patient presents for a monthly lymphedema/ulceration follow-up.  The patient was seen with his wife.  The patient reports improvement to his right lower extremity edema and states his ulcerations have healed.  The patient denies any right lower extremity claudication, rest pain or new ulcer development.  The patient denies any issues with left lower extremity.  The patient has medical grade 1 compression stockings that he can transition to.  The patient denies any fever, nausea vomiting.   Review of Systems  Constitutional: Negative.   HENT: Negative.   Eyes: Negative.   Respiratory: Negative.   Cardiovascular: Positive for leg swelling.  Gastrointestinal: Negative.   Endocrine: Negative.   Genitourinary: Negative.   Musculoskeletal: Negative.   Skin: Positive for wound.  Allergic/Immunologic: Negative.   Neurological: Negative.   Hematological: Negative.   Psychiatric/Behavioral: Negative.       Objective:   Physical Exam  Constitutional: He is oriented to person, place, and time. He appears well-developed and well-nourished. No distress.  HENT:  Head: Normocephalic and atraumatic.  Eyes: Conjunctivae are normal. Pupils are equal, round, and reactive to light.  Neck: Normal range of motion.  Cardiovascular: Normal rate, regular rhythm, normal heart sounds and intact distal pulses.  Pulses:      Radial pulses are 2+ on the right side, and 2+ on the left side.  Hard to palpate pedal pulses however the bilateral feet are warm  Pulmonary/Chest: Effort normal and breath sounds normal.  Musculoskeletal: Normal range of motion. He exhibits no edema.  Neurological: He is alert and oriented to person, place, and time.  Skin: Skin is warm and dry. He is not diaphoretic.  No ulcerations noted to the bilateral lower  extremity.  No cellulitis noted to the bilateral lower extremity  Psychiatric: He has a normal mood and affect. His behavior is normal. Judgment and thought content normal.  Vitals reviewed.  BP 123/68   Pulse 71   Resp 17   Ht 6' (1.829 m)   Wt (!) 306 lb (138.8 kg)   BMI 41.50 kg/m   Past Medical History:  Diagnosis Date  . Arthritis   . Bilateral swelling of feet   . BPH (benign prostatic hyperplasia)   . DVT (deep venous thrombosis) (HCC)    right lower extremity  . History of gout   . Hypertension   . Tendonitis of foot    left   Social History   Socioeconomic History  . Marital status: Married    Spouse name: Not on file  . Number of children: Not on file  . Years of education: Not on file  . Highest education level: Not on file  Social Needs  . Financial resource strain: Not on file  . Food insecurity - worry: Not on file  . Food insecurity - inability: Not on file  . Transportation needs - medical: Not on file  . Transportation needs - non-medical: Not on file  Occupational History  . Not on file  Tobacco Use  . Smoking status: Former Research scientist (life sciences)  . Smokeless tobacco: Never Used  . Tobacco comment: 25 years ago quit  Substance and Sexual Activity  . Alcohol use: No  . Drug use: No  . Sexual activity: Not on file  Other Topics Concern  .  Not on file  Social History Narrative  . Not on file   Past Surgical History:  Procedure Laterality Date  . APPENDECTOMY    . HERNIA REPAIR     Family History  Problem Relation Age of Onset  . Arthritis Mother   . Hypertension Mother    Allergies  Allergen Reactions  . Shellfish Allergy       Assessment & Plan:  Patient presents for a monthly lymphedema/ulceration follow-up.  The patient was seen with his wife.  The patient reports improvement to his right lower extremity edema and states his ulcerations have healed.  The patient denies any right lower extremity claudication, rest pain or new ulcer development.   The patient denies any issues with left lower extremity.  The patient has medical grade 1 compression stockings that he can transition to.  The patient denies any fever, nausea vomiting.  1. Lymphedema - Stable Patient with an improvement to the right lower extremity edema. The patient's ulcerations have completely healed to the right calf The patient denies any issues with the left lower extremity Okay to stop Unna boot therapy to the right lower extremity and transition into medical grade 1 compression stockings The patient is to call the office if he should experience any worsening swelling or ulcer formation to either extremity The patient expresses his understanding  2. Venous ulcer ([place]) - Stable As above  Current Outpatient Medications on File Prior to Visit  Medication Sig Dispense Refill  . allopurinol (ZYLOPRIM) 100 MG tablet Take 100 mg by mouth daily.    Marland Kitchen diltiazem (CARDIZEM LA) 240 MG 24 hr tablet Take 240 mg by mouth daily.    Marland Kitchen docusate sodium (COLACE) 100 MG capsule Take 100 mg by mouth daily.    . fexofenadine (ALLEGRA) 180 MG tablet Take 180 mg by mouth daily as needed.    . fluticasone (FLONASE) 50 MCG/ACT nasal spray Place into the nose.    . furosemide (LASIX) 40 MG tablet     . hydrochlorothiazide (HYDRODIURIL) 25 MG tablet Take 25 mg by mouth daily.    Marland Kitchen levETIRAcetam (KEPPRA) 500 MG tablet Take 1 tablet by mouth two  times daily    . losartan (COZAAR) 100 MG tablet Take 100 mg by mouth daily.    Marland Kitchen losartan-hydrochlorothiazide (HYZAAR) 100-12.5 MG tablet     . metoprolol (LOPRESSOR) 50 MG tablet Take 50 mg by mouth daily.    . Multiple Vitamin (ONE-A-DAY MENS PO) Take by mouth daily.    . Rivaroxaban (XARELTO) 15 MG TABS tablet Take 20 mg by mouth.     Marland Kitchen aspirin 81 MG tablet Take 81 mg by mouth daily.    Marland Kitchen doxycycline (VIBRA-TABS) 100 MG tablet      No current facility-administered medications on file prior to visit.    There are no Patient Instructions on  file for this visit. No Follow-up on file.  Caeleigh Prohaska A Diahann Guajardo, PA-C

## 2017-10-01 ENCOUNTER — Ambulatory Visit: Payer: Medicare Other | Admitting: Podiatry

## 2017-10-15 ENCOUNTER — Ambulatory Visit: Payer: Medicare Other | Admitting: Podiatry

## 2017-10-15 ENCOUNTER — Encounter: Payer: Self-pay | Admitting: Podiatry

## 2017-10-15 DIAGNOSIS — B351 Tinea unguium: Secondary | ICD-10-CM | POA: Diagnosis not present

## 2017-10-15 DIAGNOSIS — M79676 Pain in unspecified toe(s): Secondary | ICD-10-CM | POA: Diagnosis not present

## 2017-10-15 DIAGNOSIS — M79609 Pain in unspecified limb: Principal | ICD-10-CM

## 2017-10-15 DIAGNOSIS — D689 Coagulation defect, unspecified: Secondary | ICD-10-CM | POA: Diagnosis not present

## 2017-10-15 NOTE — Progress Notes (Signed)
Complaint:  Visit Type: Patient returns to my office for continued preventative foot care services. Complaint: Patient states" my nails have grown long and thick and become painful to walk and wear shoes"  The patient presents for preventative foot care services. No changes to ROS.  Patient is taking xarelto.  Podiatric Exam: Vascular: dorsalis pedis and posterior tibial pulses are palpable bilateral. Capillary return is immediate. Temperature gradient is WNL. Skin turgor WNL  Sensorium: Normal Semmes Weinstein monofilament test. Normal tactile sensation bilaterally. Nail Exam: Pt has thick disfigured discolored nails with subungual debris noted bilateral entire nail hallux through fifth toenails.  Subungual ulceration due to fungus toenail. Ulcer Exam: There is no evidence of ulcer or pre-ulcerative changes or infection. Orthopedic Exam: Muscle tone and strength are WNL. No limitations in general ROM. No crepitus or effusions noted. Foot type and digits show no abnormalities. Bony prominences are unremarkable. Skin: No Porokeratosis. No infection or ulcers  Diagnosis:  Onychomycosis, , Pain in right toe, pain in left toes,  Treatment & Plan Procedures and Treatment: Consent by patient was obtained for treatment procedures.   Debridement of mycotic and hypertrophic toenails, 1 through 5 bilateral and clearing of subungual debris. no infection noted.     Return Visit-Office Procedure: Patient instructed to return to the office for a follow up visit 3 months for continued evaluation and treatment.    Tong Pieczynski DPM 

## 2017-11-23 ENCOUNTER — Ambulatory Visit (INDEPENDENT_AMBULATORY_CARE_PROVIDER_SITE_OTHER): Payer: Medicare Other | Admitting: Vascular Surgery

## 2017-11-23 ENCOUNTER — Encounter (INDEPENDENT_AMBULATORY_CARE_PROVIDER_SITE_OTHER): Payer: Self-pay | Admitting: Vascular Surgery

## 2017-11-23 VITALS — BP 117/65 | HR 79 | Resp 18 | Ht 71.0 in | Wt 308.0 lb

## 2017-11-23 DIAGNOSIS — I872 Venous insufficiency (chronic) (peripheral): Secondary | ICD-10-CM

## 2017-11-23 DIAGNOSIS — N183 Chronic kidney disease, stage 3 unspecified: Secondary | ICD-10-CM

## 2017-11-23 DIAGNOSIS — I890 Lymphedema, not elsewhere classified: Secondary | ICD-10-CM | POA: Diagnosis not present

## 2017-11-23 DIAGNOSIS — I129 Hypertensive chronic kidney disease with stage 1 through stage 4 chronic kidney disease, or unspecified chronic kidney disease: Secondary | ICD-10-CM

## 2017-11-23 NOTE — Progress Notes (Signed)
MRN : [id number]  Justin Shannon. is a 77 y.o. ([birth year]) male who presents with chief complaint of  Chief Complaint  Patient presents with  . Follow-up    6 month no studies  .  History of Present Illness:  The patient returns to the office for followup evaluation regarding leg swelling.  The swelling has persisted but with the lymph pump is much, much better controlled. The pain associated with swelling is essentially eliminated. There have not been any interval development of a ulcerations or wounds.  The patient denies problems with the pump, noting it is working well and the leggings are in good condition.  Since the previous visit the patient has been wearing graduated compression stockings and using the lymph pump on a routine basis and  has noted significant improvement in the lymphedema.   Patient stated the lymph pump has been a very positive factor in her care.    Current Meds  Medication Sig  . allopurinol (ZYLOPRIM) 100 MG tablet Take 100 mg by mouth daily.  Marland Kitchen aspirin 81 MG tablet Take 81 mg by mouth daily.  Marland Kitchen diltiazem (CARDIZEM LA) 240 MG 24 hr tablet Take 240 mg by mouth daily.  Marland Kitchen docusate sodium (COLACE) 100 MG capsule Take 100 mg by mouth daily.  Marland Kitchen doxycycline (VIBRA-TABS) 100 MG tablet   . fexofenadine (ALLEGRA) 180 MG tablet Take 180 mg by mouth daily as needed.  . fluticasone (FLONASE) 50 MCG/ACT nasal spray Place into the nose.  . furosemide (LASIX) 40 MG tablet   . hydrochlorothiazide (HYDRODIURIL) 25 MG tablet Take 25 mg by mouth daily.  Marland Kitchen levETIRAcetam (KEPPRA) 500 MG tablet Take 1 tablet by mouth two  times daily  . losartan (COZAAR) 100 MG tablet Take 100 mg by mouth daily.   Marland Kitchen losartan-hydrochlorothiazide (HYZAAR) 100-12.5 MG tablet   . metoprolol (LOPRESSOR) 50 MG tablet Take 50 mg by mouth daily.  . metoprolol succinate (TOPROL-XL) 50 MG 24 hr tablet   . Multiple Vitamin (ONE-A-DAY MENS PO) Take by mouth daily.  . Rivaroxaban (XARELTO) 15 MG  TABS tablet Take 20 mg by mouth.     Past Medical History:  Diagnosis Date  . Arthritis   . Bilateral swelling of feet   . BPH (benign prostatic hyperplasia)   . DVT (deep venous thrombosis) (HCC)    right lower extremity  . History of gout   . Hypertension   . Tendonitis of foot    left    Past Surgical History:  Procedure Laterality Date  . APPENDECTOMY    . HERNIA REPAIR      Social History Social History   Tobacco Use  . Smoking status: Former Research scientist (life sciences)  . Smokeless tobacco: Never Used  . Tobacco comment: 25 years ago quit  Substance Use Topics  . Alcohol use: No  . Drug use: No    Family History Family History  Problem Relation Age of Onset  . Arthritis Mother   . Hypertension Mother     Allergies  Allergen Reactions  . Shellfish Allergy      REVIEW OF SYSTEMS (Negative unless checked)  Constitutional: [] Weight loss  [] Fever  [] Chills Cardiac: [] Chest pain   [] Chest pressure   [] Palpitations   [] Shortness of breath when laying flat   [] Shortness of breath with exertion. Vascular:  [] Pain in legs with walking   [] Pain in legs at rest  [x] History of DVT   [] Phlebitis   [x] Swelling in legs   [x] Varicose veins   []   Non-healing ulcers Pulmonary:   [] Uses home oxygen   [] Productive cough   [] Hemoptysis   [] Wheeze  [] COPD   [x] Asthma Neurologic:  [] Dizziness   [] Seizures   [] History of stroke   [] History of TIA  [] Aphasia   [] Vissual changes   [] Weakness or numbness in arm   [] Weakness or numbness in leg Musculoskeletal:   [] Joint swelling   [x] Joint pain   [] Low back pain Hematologic:  [] Easy bruising  [] Easy bleeding   [] Hypercoagulable state   [] Anemic Gastrointestinal:  [] Diarrhea   [] Vomiting  [] Gastroesophageal reflux/heartburn   [] Difficulty swallowing. Genitourinary:  [x] Chronic kidney disease   [] Difficult urination  [] Frequent urination   [] Blood in urine Skin:  [] Rashes   [] Ulcers  Psychological:  [] History of anxiety   []  History of major  depression.  Physical Examination  Vitals:   11/23/17 0939  BP: 117/65  Pulse: 79  Resp: 18  Weight: (!) 308 lb (139.7 kg)  Height: 5\' 11"  (1.803 m)   Body mass index is 42.96 kg/m. Gen: WD/WN, NAD Head: [place]/AT, No temporalis wasting.  Ear/Nose/Throat: Hearing grossly intact, nares w/o erythema or drainage Eyes: PER, EOMI, sclera nonicteric.  Neck: Supple, no large masses.   Pulmonary:  Good air movement, no audible wheezing bilaterally, no use of accessory muscles.  Cardiac: RRR, no JVD Vascular: scattered varicosities present bilaterally.  severe venous stasis changes to the legs bilaterally.  3-4+ soft pitting edema Vessel Right Left  Radial Palpable Palpable  Gastrointestinal: Non-distended. No guarding/no peritoneal signs.  Musculoskeletal: M/S 5/5 throughout.  No deformity or atrophy.  Neurologic: CN 2-12 intact. Symmetrical.  Speech is fluent. Motor exam as listed above. Psychiatric: Judgment intact, Mood & affect appropriate for pt's clinical situation. Dermatologic: venous rashes no ulcers noted.  No changes consistent with cellulitis. Lymph : No lichenification or skin changes of chronic lymphedema.  CBC Lab Results  Component Value Date   WBC 8.3 10/23/2012   HGB 12.6 (L) 10/23/2012   HCT 39.4 (L) 10/23/2012   MCV 72 (L) 10/23/2012   PLT 174 10/23/2012    BMET    Component Value Date/Time   NA 140 10/23/2012 0519   K 4.4 10/23/2012 0519   CL 109 (H) 10/23/2012 0519   CO2 24 10/23/2012 0519   GLUCOSE 144 (H) 10/23/2012 0519   BUN 26 (H) 10/23/2012 0519   CREATININE 1.73 (H) 10/23/2012 0519   CALCIUM 9.6 10/23/2012 0519   GFRNONAA 39 (L) 10/23/2012 0519   GFRAA 45 (L) 10/23/2012 0519   CrCl cannot be calculated ([name] most recent lab result is older than the maximum 21 days allowed.).  COAG Lab Results  Component Value Date   INR 1.1 09/04/2011   INR 1.0 09/02/2011    Radiology No results found.   Assessment/Plan 1. Chronic venous  insufficiency No surgery or intervention at this point in time.    I have reviewed my discussion with the patient regarding lymphedema and why it  causes symptoms.  Patient will continue wearing graduated compression stockings class 1 (20-30 mmHg) on a daily basis a prescription was given. The patient is reminded to put the stockings on first thing in the morning and removing them in the evening. The patient is instructed specifically not to sleep in the stockings.   In addition, behavioral modification throughout the day will be continued.  This will include frequent elevation (such as in a recliner), use of over the counter pain medications as needed and exercise such as walking.  He now  also has a lift chair to aid in elevation.    I have reviewed systemic causes for chronic edema such as liver, kidney and cardiac etiologies and there does not appear to be any significant changes in these organ systems over the past year.  The patient is under the impression that these organ systems are all stable and unchanged.    The patient will continue aggressive use of the  lymph pump.  This will continue to improve the edema control and prevent sequela such as ulcers and infections.   The patient will follow-up with me on an annual basis.   2. Lymphedema No surgery or intervention at this point in time.    I have reviewed my discussion with the patient regarding lymphedema and why it  causes symptoms.  Patient will continue wearing graduated compression stockings class 1 (20-30 mmHg) on a daily basis a prescription was given. The patient is reminded to put the stockings on first thing in the morning and removing them in the evening. The patient is instructed specifically not to sleep in the stockings.   In addition, behavioral modification throughout the day will be continued.  This will include frequent elevation (such as in a recliner), use of over the counter pain medications as needed and exercise  such as walking.  He now also has a lift chair to aid in elevation.    I have reviewed systemic causes for chronic edema such as liver, kidney and cardiac etiologies and there does not appear to be any significant changes in these organ systems over the past year.  The patient is under the impression that these organ systems are all stable and unchanged.    The patient will continue aggressive use of the  lymph pump.  This will continue to improve the edema control and prevent sequela such as ulcers and infections.   The patient will follow-up with me on an annual basis.   3. Benign hypertension with chronic kidney disease, stage III (HCC) Continue antihypertensive medications as already ordered, these medications have been reviewed and there are no changes at this time.   4. Chronic kidney disease (CKD), stage III (moderate) (HCC) Avoid nephrotoxic drugs and dehydration  Follow up with Nephrology as arranged      Hortencia Pilar, MD  11/23/2017 9:53 AM

## 2017-12-21 ENCOUNTER — Encounter (INDEPENDENT_AMBULATORY_CARE_PROVIDER_SITE_OTHER): Payer: Self-pay | Admitting: Vascular Surgery

## 2017-12-21 ENCOUNTER — Ambulatory Visit (INDEPENDENT_AMBULATORY_CARE_PROVIDER_SITE_OTHER): Payer: Medicare Other | Admitting: Vascular Surgery

## 2017-12-21 VITALS — BP 136/78 | HR 86 | Resp 17 | Ht 70.0 in | Wt 310.0 lb

## 2017-12-21 DIAGNOSIS — I83023 Varicose veins of left lower extremity with ulcer of ankle: Secondary | ICD-10-CM | POA: Diagnosis not present

## 2017-12-21 DIAGNOSIS — I890 Lymphedema, not elsewhere classified: Secondary | ICD-10-CM

## 2017-12-21 DIAGNOSIS — N183 Chronic kidney disease, stage 3 unspecified: Secondary | ICD-10-CM

## 2017-12-21 DIAGNOSIS — I872 Venous insufficiency (chronic) (peripheral): Secondary | ICD-10-CM

## 2017-12-21 DIAGNOSIS — I129 Hypertensive chronic kidney disease with stage 1 through stage 4 chronic kidney disease, or unspecified chronic kidney disease: Secondary | ICD-10-CM

## 2017-12-21 DIAGNOSIS — L97909 Non-pressure chronic ulcer of unspecified part of unspecified lower leg with unspecified severity: Secondary | ICD-10-CM

## 2017-12-21 DIAGNOSIS — I83009 Varicose veins of unspecified lower extremity with ulcer of unspecified site: Secondary | ICD-10-CM

## 2017-12-21 NOTE — Progress Notes (Signed)
MRN : [id number]  Justin Shannon. is a 77 y.o. (07/24/1940) male who presents with chief complaint of  Chief Complaint  Patient presents with  . Follow-up    Check spot on Right leg  .  History of Present Illness: Patient is seen for evaluation of leg pain and swelling associated with new onset ulceration of the right ankle. The patient first noticed the swelling remotely. The swelling is associated with pain and discoloration. The pain and swelling worsens with prolonged dependency and improves with elevation. The pain is unrelated to activity.  The patient notes that in the morning the legs are better but the leg symptoms worsened throughout the course of the day. The patient has also noted a progressive worsening of the discoloration in the ankle and shin area.   The patient notes that an ulcer has developed acutely without specific trauma and since it occurred it has been very slow to heal.  There is a moderate amount of drainage associated with the open area.  The wound is also very painful.  The patient denies claudication symptoms or rest pain symptoms.  The patient denies DJD and LS spine disease.  The patient has not had any past angiography, interventions or vascular surgery.  Elevation makes the leg symptoms better, dependency makes them much worse. The patient denies any recent changes in medications.  The patient has been wearing graduated compression and using his lymph pump.  The patient denies a history of DVT or PE. There is no prior history of phlebitis. There is no history of primary lymphedema.  No history of malignancies. No history of trauma or groin or pelvic surgery. There is no history of radiation treatment to the groin or pelvis       Current Meds  Medication Sig  . allopurinol (ZYLOPRIM) 100 MG tablet Take 100 mg by mouth daily.  Marland Kitchen aspirin 81 MG tablet Take 81 mg by mouth daily.  Marland Kitchen diltiazem (CARDIZEM LA) 240 MG 24 hr tablet Take 240 mg by mouth  daily.  Marland Kitchen docusate sodium (COLACE) 100 MG capsule Take 100 mg by mouth daily.  Marland Kitchen doxycycline (VIBRA-TABS) 100 MG tablet   . fexofenadine (ALLEGRA) 180 MG tablet Take 180 mg by mouth daily as needed.  . fluticasone (FLONASE) 50 MCG/ACT nasal spray Place into the nose.  . furosemide (LASIX) 40 MG tablet   . hydrochlorothiazide (HYDRODIURIL) 25 MG tablet Take 25 mg by mouth daily.  Marland Kitchen levETIRAcetam (KEPPRA) 500 MG tablet Take 1 tablet by mouth two  times daily  . losartan (COZAAR) 100 MG tablet Take 50 mg by mouth daily.   . metoprolol (LOPRESSOR) 50 MG tablet Take 50 mg by mouth daily.  . metoprolol succinate (TOPROL-XL) 50 MG 24 hr tablet   . Multiple Vitamin (ONE-A-DAY MENS PO) Take by mouth daily.  . Rivaroxaban (XARELTO) 15 MG TABS tablet Take 20 mg by mouth.     Past Medical History:  Diagnosis Date  . Arthritis   . Bilateral swelling of feet   . BPH (benign prostatic hyperplasia)   . DVT (deep venous thrombosis) (HCC)    right lower extremity  . History of gout   . Hypertension   . Tendonitis of foot    left    Past Surgical History:  Procedure Laterality Date  . APPENDECTOMY    . HERNIA REPAIR      Social History Social History   Tobacco Use  . Smoking status: Former Research scientist (life sciences)  . Smokeless  tobacco: Never Used  . Tobacco comment: 25 years ago quit  Substance Use Topics  . Alcohol use: No  . Drug use: No    Family History Family History  Problem Relation Age of Onset  . Arthritis Mother   . Hypertension Mother     Allergies  Allergen Reactions  . Shellfish Allergy      REVIEW OF SYSTEMS (Negative unless checked)  Constitutional: [] Weight loss  [] Fever  [] Chills Cardiac: [] Chest pain   [] Chest pressure   [] Palpitations   [] Shortness of breath when laying flat   [] Shortness of breath with exertion. 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 Genitourinary:  [] Chronic kidney disease   [] Difficult urination  [] Frequent urination   [] Blood in urine Skin:  [x] Rashes   [] Ulcers  Psychological:  [] History of anxiety   []  History of major depression.  Physical Examination  Vitals:   12/21/17 1133  BP: 136/78  Pulse: 86  Resp: 17  Weight: (!) 310 lb (140.6 kg)  Height: 5\' 10"  (1.778 m)   Body mass index is 44.48 kg/m. Gen: WD/WN, NAD Head: [place]/AT, No temporalis wasting.  Ear/Nose/Throat: Hearing grossly intact, nares w/o erythema or drainage Eyes: PER, EOMI, sclera nonicteric.  Neck: Supple, no large masses.   Pulmonary:  Good air movement, no audible wheezing bilaterally, no use of accessory muscles.  Cardiac: RRR, no JVD Vascular: 2-3+ edema of the left leg with severe venous changes of the left leg.  Venous ulcer noted in the ankle area on the left, noninfected Vessel Right Left  Radial Palpable Palpable  PT Palpable Palpable  DP Palpable Palpable  Gastrointestinal: Non-distended. No guarding/no peritoneal signs.  Musculoskeletal: M/S 5/5 throughout.  No deformity or atrophy.  Neurologic: CN 2-12 intact. Symmetrical.  Speech is fluent. Motor exam as listed above. Psychiatric: Judgment intact, Mood & affect appropriate for pt's clinical situation. Dermatologic: Venous stasis dermatitis with ulcers present on the left.  No changes consistent with cellulitis. Lymph : No lichenification or skin  changes of chronic lymphedema.  CBC Lab Results  Component Value Date   WBC 8.3 10/23/2012   HGB 12.6 (L) 10/23/2012   HCT 39.4 (L) 10/23/2012   MCV 72 (L) 10/23/2012   PLT 174 10/23/2012    BMET    Component Value Date/Time   NA 140 10/23/2012 0519   K 4.4 10/23/2012 0519   CL 109 (H) 10/23/2012 0519   CO2 24 10/23/2012 0519   GLUCOSE 144 (H) 10/23/2012 0519   BUN 26 (H) 10/23/2012 0519   CREATININE 1.73 (H) 10/23/2012 0519   CALCIUM 9.6 10/23/2012 0519   GFRNONAA 39 (L) 10/23/2012 0519   GFRAA 45 (L) 10/23/2012 0519   CrCl cannot be calculated ([name] most recent lab result is older than the  maximum 21 days allowed.).  COAG Lab Results  Component Value Date   INR 1.1 09/04/2011   INR 1.0 09/02/2011    Radiology No results found.    Assessment/Plan 1. Venous ulcer ([place]) No surgery or intervention at this point in time.    I have had a long discussion with the patient regarding venous insufficiency and why it  causes symptoms, specifically venous ulceration . I have discussed with the patient the chronic skin changes that accompany venous insufficiency and the long term sequela such as infection and recurring  ulceration.  Patient will be placed in [employer] which will be changed weekly drainage permitting.  In addition, behavioral modification including several periods of elevation of the lower extremities during the day will be continued. Achieving a position with the ankles at heart level was stressed to the patient  The patient is instructed to begin routine exercise, especially walking on a daily basis  Patient should undergo duplex ultrasound of the venous system to ensure that DVT or reflux is not present.  Following the review of the ultrasound the patient will follow up in one week to reassess the degree of swelling and the control that [place] therapy is offering.   The patient can be assessed for graduated compression stockings or wraps as well as a  Lymph Pump once the ulcers are healed.   2. Chronic venous insufficiency  No surgery or intervention at this point in time.    I have reviewed my discussion with the patient regarding lymphedema and why it  causes symptoms.  Patient will continue wearing graduated compression stockings class 1 (20-30 mmHg) on a daily basis a prescription was given. The patient is reminded to put the stockings on first thing in the morning and removing them in the evening. The patient is instructed specifically not to sleep in the stockings.   In addition, behavioral modification throughout the day will be continued.  This will include frequent elevation (such as in a recliner), use of over the counter pain medications as needed and exercise such as walking.  I have reviewed systemic causes for chronic edema such as liver, kidney and cardiac etiologies and there does not appear to be any significant changes in these organ systems over the past year.  The patient is under the impression that these organ systems are all stable and unchanged.    The patient will continue aggressive use of the  lymph pump.  This will continue to improve the edema control and prevent sequela such as ulcers and infections.   The patient will follow-up with me on an annual basis.    3. Lymphedema  No surgery or intervention at this point in time.    I have reviewed my discussion with the patient regarding lymphedema and why it  causes symptoms.  Patient will continue wearing graduated compression stockings class 1 (20-30 mmHg) on a daily basis a prescription was given. The patient is reminded to put the stockings on first thing in the morning and removing them in the evening. The patient is instructed specifically not to sleep in the stockings.   In addition, behavioral modification throughout the day will be continued.  This will include frequent elevation (such as in a recliner), use of over the counter pain medications as needed and  exercise such as walking.  I have reviewed systemic causes for chronic edema such as liver, kidney and cardiac etiologies and there does not appear to be any significant changes in  these organ systems over the past year.  The patient is under the impression that these organ systems are all stable and unchanged.    The patient will continue aggressive use of the  lymph pump.  This will continue to improve the edema control and prevent sequela such as ulcers and infections.   The patient will follow-up with me on an annual basis.    4. Benign hypertension with chronic kidney disease, stage III (HCC) Continue antihypertensive medications as already ordered, these medications have been reviewed and there are no changes at this time.    Hortencia Pilar, MD  12/21/2017 11:43 AM

## 2017-12-22 ENCOUNTER — Ambulatory Visit (INDEPENDENT_AMBULATORY_CARE_PROVIDER_SITE_OTHER): Payer: Medicare Other | Admitting: Vascular Surgery

## 2017-12-23 ENCOUNTER — Encounter (INDEPENDENT_AMBULATORY_CARE_PROVIDER_SITE_OTHER): Payer: Self-pay | Admitting: Vascular Surgery

## 2017-12-28 ENCOUNTER — Encounter (INDEPENDENT_AMBULATORY_CARE_PROVIDER_SITE_OTHER): Payer: Self-pay

## 2017-12-28 ENCOUNTER — Encounter (INDEPENDENT_AMBULATORY_CARE_PROVIDER_SITE_OTHER): Payer: Medicare Other

## 2017-12-28 ENCOUNTER — Ambulatory Visit (INDEPENDENT_AMBULATORY_CARE_PROVIDER_SITE_OTHER): Payer: Medicare Other | Admitting: Vascular Surgery

## 2017-12-28 VITALS — BP 124/63 | HR 78 | Resp 17 | Ht 70.0 in | Wt 309.0 lb

## 2017-12-28 DIAGNOSIS — I872 Venous insufficiency (chronic) (peripheral): Secondary | ICD-10-CM

## 2017-12-28 DIAGNOSIS — I890 Lymphedema, not elsewhere classified: Secondary | ICD-10-CM

## 2017-12-28 NOTE — Progress Notes (Signed)
History of Present Illness  There is no documented history at this time  Assessments & Plan   There are no diagnoses linked to this encounter.    Additional instructions  Subjective:  Patient presents with venous ulcer of the Right lower extremity.    Procedure:  3 layer unna wrap was placed Right lower extremity.   Plan:   Follow up in one week.   

## 2017-12-29 ENCOUNTER — Encounter (INDEPENDENT_AMBULATORY_CARE_PROVIDER_SITE_OTHER): Payer: Self-pay | Admitting: Vascular Surgery

## 2018-01-04 ENCOUNTER — Ambulatory Visit (INDEPENDENT_AMBULATORY_CARE_PROVIDER_SITE_OTHER): Payer: Medicare Other | Admitting: Vascular Surgery

## 2018-01-04 ENCOUNTER — Encounter (INDEPENDENT_AMBULATORY_CARE_PROVIDER_SITE_OTHER): Payer: Self-pay

## 2018-01-04 VITALS — BP 120/72 | HR 64 | Resp 17 | Ht 70.5 in | Wt 307.0 lb

## 2018-01-04 DIAGNOSIS — I872 Venous insufficiency (chronic) (peripheral): Secondary | ICD-10-CM

## 2018-01-04 DIAGNOSIS — I83009 Varicose veins of unspecified lower extremity with ulcer of unspecified site: Secondary | ICD-10-CM

## 2018-01-04 DIAGNOSIS — L97909 Non-pressure chronic ulcer of unspecified part of unspecified lower leg with unspecified severity: Secondary | ICD-10-CM

## 2018-01-04 NOTE — Progress Notes (Signed)
History of Present Illness  There is no documented history at this time  Assessments & Plan   There are no diagnoses linked to this encounter.    Additional instructions  Subjective:  Patient presents with venous ulcer of the Right lower extremity.    Procedure:  3 layer unna wrap was placed Right lower extremity.   Plan:   Follow up in one week.   

## 2018-01-11 ENCOUNTER — Ambulatory Visit (INDEPENDENT_AMBULATORY_CARE_PROVIDER_SITE_OTHER): Payer: Medicare Other | Admitting: Vascular Surgery

## 2018-01-11 ENCOUNTER — Encounter (INDEPENDENT_AMBULATORY_CARE_PROVIDER_SITE_OTHER): Payer: Self-pay | Admitting: Vascular Surgery

## 2018-01-11 DIAGNOSIS — L97909 Non-pressure chronic ulcer of unspecified part of unspecified lower leg with unspecified severity: Secondary | ICD-10-CM | POA: Diagnosis not present

## 2018-01-11 DIAGNOSIS — I129 Hypertensive chronic kidney disease with stage 1 through stage 4 chronic kidney disease, or unspecified chronic kidney disease: Secondary | ICD-10-CM

## 2018-01-11 DIAGNOSIS — I83009 Varicose veins of unspecified lower extremity with ulcer of unspecified site: Secondary | ICD-10-CM

## 2018-01-11 DIAGNOSIS — N183 Chronic kidney disease, stage 3 unspecified: Secondary | ICD-10-CM

## 2018-01-11 DIAGNOSIS — I872 Venous insufficiency (chronic) (peripheral): Secondary | ICD-10-CM | POA: Diagnosis not present

## 2018-01-11 NOTE — Progress Notes (Signed)
MRN : [id number]  Justin Shannon. is a 77 y.o. (Jan 07, 1941) male who presents with chief complaint of  Chief Complaint  Patient presents with  . Follow-up    Unna check  .  History of Present Illness:   Patient is seen for follow up evaluation of leg pain and swelling associated with venous ulceration. The patient was recently seen here and started on Unna boot therapy.  The swelling abruptly became much worse bilaterally and is associated with pain and discoloration. The pain and swelling worsens with prolonged dependency and improves with elevation.  The patient notes that in the morning the legs are better but the leg symptoms worsened throughout the course of the day. The patient has also noted a progressive worsening of the discoloration in the ankle and shin area.   The patient notes that an ulcer has developed acutely without specific trauma and since it occurred it has been very slow to heal.  There is a moderate amount of drainage associated with the open area.  The wound is also very painful.  The patient notes that they were not able to tolerate the Unna boot and removed it several days ago.  The patient states that they have been elevating as much as possible. The patient denies any recent changes in medications.  The patient denies a history of DVT or PE. There is no prior history of phlebitis. There is no history of primary lymphedema.  No SOB or increased cough.  No sputum production.  No recent episodes of CHF exacerbation.   No outpatient medications have been marked as taking for the 01/11/18 encounter (Office Visit) with Delana Meyer, Dolores Lory, MD.    Past Medical History:  Diagnosis Date  . Arthritis   . Bilateral swelling of feet   . BPH (benign prostatic hyperplasia)   . DVT (deep venous thrombosis) (HCC)    right lower extremity  . History of gout   . Hypertension   . Tendonitis of foot    left    Past Surgical History:  Procedure Laterality Date  .  APPENDECTOMY    . HERNIA REPAIR      Social History Social History   Tobacco Use  . Smoking status: Former Research scientist (life sciences)  . Smokeless tobacco: Never Used  . Tobacco comment: 25 years ago quit  Substance Use Topics  . Alcohol use: No  . Drug use: No    Family History Family History  Problem Relation Age of Onset  . Arthritis Mother   . Hypertension Mother     Allergies  Allergen Reactions  . Shellfish Allergy      REVIEW OF SYSTEMS (Negative unless checked)  Constitutional: [] Weight loss  [] Fever  [] Chills Cardiac: [] Chest pain   [] Chest pressure   [] Palpitations   [] Shortness of breath when laying flat   [] Shortness of breath with exertion. Vascular:  [] Pain in legs with walking   [] Pain in legs at rest  [] History of DVT   [] Phlebitis   [x] Swelling in legs   [x] Varicose veins   [] Non-healing ulcers Pulmonary:   [] Uses home oxygen   [] Productive cough   [] Hemoptysis   [] Wheeze  [] COPD   [] Asthma Neurologic:  [] Dizziness   [] Seizures   [] History of stroke   [] History of TIA  [] Aphasia   [] Vissual changes   [] Weakness or numbness in arm   [] Weakness or numbness in leg Musculoskeletal:   [] Joint swelling   [] Joint pain   [] Low back pain Hematologic:  [] Easy  bruising  [] Easy bleeding   [] Hypercoagulable state   [] Anemic Gastrointestinal:  [] Diarrhea   [] Vomiting  [] Gastroesophageal reflux/heartburn   [] Difficulty swallowing. Genitourinary:  [] Chronic kidney disease   [] Difficult urination  [] Frequent urination   [] Blood in urine Skin:  [] Rashes   [x] Ulcers  Psychological:  [] History of anxiety   []  History of major depression.  Physical Examination  There were no vitals filed for this visit. There is no height or weight on file to calculate BMI. Gen: WD/WN, NAD Head: [place]/AT, No temporalis wasting.  Ear/Nose/Throat: Hearing grossly intact, nares w/o erythema or drainage Eyes: PER, EOMI, sclera nonicteric.  Neck: Supple, no large masses.   Pulmonary:  Good air movement, no  audible wheezing bilaterally, no use of accessory muscles.  Cardiac: RRR, no JVD Vascular: 2-3+ edema of the right leg with severe venous changes of the right leg.  Venous ulcer noted in the ankle area on the right, noninfected Vessel Right Left  Radial Palpable Palpable  PT Palpable Palpable  DP Palpable Palpable  Gastrointestinal: Non-distended. No guarding/no peritoneal signs.  Musculoskeletal: M/S 5/5 throughout.  No deformity or atrophy.  Neurologic: CN 2-12 intact. Symmetrical.  Speech is fluent. Motor exam as listed above. Psychiatric: Judgment intact, Mood & affect appropriate for pt's clinical situation. Dermatologic: Venous stasis dermatitis with ulcers present on the right.  No changes consistent with cellulitis. Lymph : No lichenification or skin changes of chronic lymphedema.  CBC Lab Results  Component Value Date   WBC 8.3 10/23/2012   HGB 12.6 (L) 10/23/2012   HCT 39.4 (L) 10/23/2012   MCV 72 (L) 10/23/2012   PLT 174 10/23/2012    BMET    Component Value Date/Time   NA 140 10/23/2012 0519   K 4.4 10/23/2012 0519   CL 109 (H) 10/23/2012 0519   CO2 24 10/23/2012 0519   GLUCOSE 144 (H) 10/23/2012 0519   BUN 26 (H) 10/23/2012 0519   CREATININE 1.73 (H) 10/23/2012 0519   CALCIUM 9.6 10/23/2012 0519   GFRNONAA 39 (L) 10/23/2012 0519   GFRAA 45 (L) 10/23/2012 0519   CrCl cannot be calculated ([name] most recent lab result is older than the maximum 21 days allowed.).  COAG Lab Results  Component Value Date   INR 1.1 09/04/2011   INR 1.0 09/02/2011    Radiology No results found.    Assessment/Plan 1. Chronic venous insufficiency No surgery or intervention at this point in time.    I have had a long discussion with the patient regarding venous insufficiency and why it  causes symptoms, specifically venous ulceration . I have discussed with the patient the chronic skin changes that accompany venous insufficiency and the long term sequela such as  infection and recurring  ulceration.  Patient will be placed in [facility] which will be changed and then he will begin using compression  In addition, behavioral modification including several periods of elevation of the lower extremities during the day will be continued. Achieving a position with the ankles at heart level was stressed to the patient  The patient is instructed to begin routine exercise, especially walking on a daily basis  The patient can be assessed for graduated compression stockings or wraps as well as a Lymph Pump once the ulcers are healed.   2. Venous ulcer ([place]) See #1  3. Benign hypertension with chronic kidney disease, stage III (HCC) Continue antihypertensive medications as already ordered, these medications have been reviewed and there are no changes at this time.  Hortencia Pilar, MD  01/11/2018 12:45 PM

## 2018-01-14 ENCOUNTER — Ambulatory Visit: Payer: Medicare Other | Admitting: Podiatry

## 2018-01-21 ENCOUNTER — Encounter: Payer: Self-pay | Admitting: Podiatry

## 2018-01-21 ENCOUNTER — Ambulatory Visit: Payer: Medicare Other | Admitting: Podiatry

## 2018-01-21 DIAGNOSIS — D689 Coagulation defect, unspecified: Secondary | ICD-10-CM

## 2018-01-21 DIAGNOSIS — M79609 Pain in unspecified limb: Secondary | ICD-10-CM

## 2018-01-21 DIAGNOSIS — B351 Tinea unguium: Secondary | ICD-10-CM

## 2018-01-21 NOTE — Progress Notes (Signed)
Complaint:  Visit Type: Patient returns to my office for continued preventative foot care services. Complaint: Patient states" my nails have grown long and thick and become painful to walk and wear shoes"  The patient presents for preventative foot care services. No changes to ROS.  Patient is taking xarelto.  Podiatric Exam: Vascular: dorsalis pedis and posterior tibial pulses are palpable bilateral. Capillary return is immediate. Temperature gradient is WNL. Skin turgor WNL  Sensorium: Normal Semmes Weinstein monofilament test. Normal tactile sensation bilaterally. Nail Exam: Pt has thick disfigured discolored nails with subungual debris noted bilateral entire nail hallux through fifth toenails.  Subungual ulceration due to fungus toenail. Ulcer Exam: There is no evidence of ulcer or pre-ulcerative changes or infection. Orthopedic Exam: Muscle tone and strength are WNL. No limitations in general ROM. No crepitus or effusions noted. Foot type and digits show no abnormalities. Bony prominences are unremarkable. Skin: No Porokeratosis. No infection or ulcers  Diagnosis:  Onychomycosis, , Pain in right toe, pain in left toes,  Treatment & Plan Procedures and Treatment: Consent by patient was obtained for treatment procedures.   Debridement of mycotic and hypertrophic toenails, 1 through 5 bilateral and clearing of subungual debris. no infection noted.     Return Visit-Office Procedure: Patient instructed to return to the office for a follow up visit 3 months for continued evaluation and treatment.    Gardiner Barefoot DPM

## 2018-04-15 ENCOUNTER — Encounter (INDEPENDENT_AMBULATORY_CARE_PROVIDER_SITE_OTHER): Payer: Self-pay | Admitting: Vascular Surgery

## 2018-04-15 ENCOUNTER — Ambulatory Visit (INDEPENDENT_AMBULATORY_CARE_PROVIDER_SITE_OTHER): Payer: Medicare Other | Admitting: Vascular Surgery

## 2018-04-15 ENCOUNTER — Other Ambulatory Visit: Payer: Self-pay

## 2018-04-15 VITALS — BP 115/59 | HR 70 | Resp 17 | Ht 70.0 in | Wt 305.0 lb

## 2018-04-15 DIAGNOSIS — I890 Lymphedema, not elsewhere classified: Secondary | ICD-10-CM

## 2018-04-15 DIAGNOSIS — G4733 Obstructive sleep apnea (adult) (pediatric): Secondary | ICD-10-CM

## 2018-04-15 DIAGNOSIS — Z87891 Personal history of nicotine dependence: Secondary | ICD-10-CM

## 2018-04-15 DIAGNOSIS — Z9989 Dependence on other enabling machines and devices: Secondary | ICD-10-CM

## 2018-04-15 DIAGNOSIS — I872 Venous insufficiency (chronic) (peripheral): Secondary | ICD-10-CM

## 2018-04-15 NOTE — Progress Notes (Signed)
MRN : [id number]  Justin Shannon. is a 77 y.o. (July 31, 1940) male who presents with chief complaint of  Chief Complaint  Patient presents with  . Follow-up    3 month , no studies  .  History of Present Illness:The patient returns to the office for followup evaluation regarding leg swelling.  The swelling has persisted but with the lymph pump is much, much better controlled. The pain associated with swelling is essentially eliminated. There have not been any interval development of a ulcerations or wounds.  The patient denies problems with the pump, noting it is working well and the leggings are in good condition.  Since the previous visit the patient has been wearing graduated compression stockings and using the lymph pump on a routine basis and  has noted significant improvement in the lymphedema.   Patient stated the lymph pump has been a very positive factor in her care.   Current Meds  Medication Sig  . allopurinol (ZYLOPRIM) 100 MG tablet Take 100 mg by mouth daily.  Marland Kitchen aspirin 81 MG tablet Take 81 mg by mouth daily.  Marland Kitchen diltiazem (CARDIZEM LA) 240 MG 24 hr tablet Take 240 mg by mouth daily.  Marland Kitchen docusate sodium (COLACE) 100 MG capsule Take 100 mg by mouth daily.  Marland Kitchen doxycycline (VIBRA-TABS) 100 MG tablet   . fexofenadine (ALLEGRA) 180 MG tablet Take 180 mg by mouth daily as needed.  . fluticasone (FLONASE) 50 MCG/ACT nasal spray Place into the nose.  . furosemide (LASIX) 40 MG tablet   . hydrochlorothiazide (HYDRODIURIL) 25 MG tablet Take 25 mg by mouth daily.  Marland Kitchen levETIRAcetam (KEPPRA) 500 MG tablet Take 1 tablet by mouth two  times daily  . losartan (COZAAR) 100 MG tablet Take 50 mg by mouth daily.   . metoprolol (LOPRESSOR) 50 MG tablet Take 50 mg by mouth daily.  . metoprolol succinate (TOPROL-XL) 50 MG 24 hr tablet   . Multiple Vitamin (ONE-A-DAY MENS PO) Take by mouth daily.  . Rivaroxaban (XARELTO) 15 MG TABS tablet Take 20 mg by mouth.   . sulfamethoxazole-trimethoprim  (BACTRIM DS,SEPTRA DS) 800-160 MG tablet Take by mouth.    Past Medical History:  Diagnosis Date  . Arthritis   . Bilateral swelling of feet   . BPH (benign prostatic hyperplasia)   . DVT (deep venous thrombosis) (HCC)    right lower extremity  . History of gout   . Hypertension   . Tendonitis of foot    left    Past Surgical History:  Procedure Laterality Date  . APPENDECTOMY    . HERNIA REPAIR      Social History Social History   Tobacco Use  . Smoking status: Former Research scientist (life sciences)  . Smokeless tobacco: Never Used  . Tobacco comment: 25 years ago quit  Substance Use Topics  . Alcohol use: No  . Drug use: No    Family History Family History  Problem Relation Age of Onset  . Arthritis Mother   . Hypertension Mother     Allergies  Allergen Reactions  . Shellfish Allergy      REVIEW OF SYSTEMS (Negative unless checked)  Constitutional: [] Weight loss  [] Fever  [] Chills Cardiac: [] Chest pain   [] Chest pressure   [] Palpitations   [] Shortness of breath when laying flat   [] Shortness of breath with exertion. Vascular:  [] Pain in legs with walking   [] Pain in legs at rest  [] History of DVT   [] Phlebitis   [x] Swelling in legs   []   Varicose veins   [] Non-healing ulcers Pulmonary:   [] Uses home oxygen   [] Productive cough   [] Hemoptysis   [] Wheeze  [] COPD   [] Asthma Neurologic:  [] Dizziness   [] Seizures   [] History of stroke   [] History of TIA  [] Aphasia   [] Vissual changes   [] Weakness or numbness in arm   [] Weakness or numbness in leg Musculoskeletal:   [] Joint swelling   [] Joint pain   [] Low back pain Hematologic:  [] Easy bruising  [] Easy bleeding   [] Hypercoagulable state   [] Anemic Gastrointestinal:  [] Diarrhea   [] Vomiting  [] Gastroesophageal reflux/heartburn   [] Difficulty swallowing. Genitourinary:  [] Chronic kidney disease   [] Difficult urination  [] Frequent urination   [] Blood in urine Skin:  [] Rashes   [] Ulcers  Psychological:  [] History of anxiety   []  History of  major depression.  Physical Examination  Vitals:   04/15/18 1105  BP: (!) 115/59  Pulse: 70  Resp: 17  Weight: (!) 305 lb (138.3 kg)  Height: 5\' 10"  (1.778 m)   Body mass index is 43.76 kg/m. Gen: WD/WN, NAD Head: [place]/AT, No temporalis wasting.  Ear/Nose/Throat: Hearing grossly intact, nares w/o erythema or drainage Eyes: PER, EOMI, sclera nonicteric.  Neck: Supple, no large masses.   Pulmonary:  Good air movement, no audible wheezing bilaterally, no use of accessory muscles.  Cardiac: RRR, no JVD Vascular: scattered varicosities present bilaterally.  Mild venous stasis changes to the legs bilaterally.  2-3+ soft pitting edema  Vessel Right Left  Radial Palpable Palpable  PT Palpable Palpable  DP Palpable Palpable  Gastrointestinal: Non-distended. No guarding/no peritoneal signs.  Musculoskeletal: M/S 5/5 throughout.  No deformity or atrophy.  Neurologic: CN 2-12 intact. Symmetrical.  Speech is fluent. Motor exam as listed above. Psychiatric: Judgment intact, Mood & affect appropriate for pt's clinical situation. Dermatologic: No rashes or ulcers noted.  No changes consistent with cellulitis. Lymph : No lichenification or skin changes of chronic lymphedema.  CBC Lab Results  Component Value Date   WBC 8.3 10/23/2012   HGB 12.6 (L) 10/23/2012   HCT 39.4 (L) 10/23/2012   MCV 72 (L) 10/23/2012   PLT 174 10/23/2012    BMET    Component Value Date/Time   NA 140 10/23/2012 0519   K 4.4 10/23/2012 0519   CL 109 (H) 10/23/2012 0519   CO2 24 10/23/2012 0519   GLUCOSE 144 (H) 10/23/2012 0519   BUN 26 (H) 10/23/2012 0519   CREATININE 1.73 (H) 10/23/2012 0519   CALCIUM 9.6 10/23/2012 0519   GFRNONAA 39 (L) 10/23/2012 0519   GFRAA 45 (L) 10/23/2012 0519   CrCl cannot be calculated ([name] most recent lab result is older than the maximum 21 days allowed.).  COAG Lab Results  Component Value Date   INR 1.1 09/04/2011   INR 1.0 09/02/2011    Radiology No results  found.   Assessment/Plan 1. Chronic venous insufficiency  No surgery or intervention at this point in time.    I have reviewed my discussion with the patient regarding lymphedema and why it  causes symptoms.  Patient will continue wearing graduated compression stockings class 1 (20-30 mmHg) on a daily basis a prescription was given. The patient is reminded to put the stockings on first thing in the morning and removing them in the evening. The patient is instructed specifically not to sleep in the stockings.   In addition, behavioral modification throughout the day will be continued.  This will include frequent elevation (such as in a recliner), use of  over the counter pain medications as needed and exercise such as walking.  I have reviewed systemic causes for chronic edema such as liver, kidney and cardiac etiologies and there does not appear to be any significant changes in these organ systems over the past year.  The patient is under the impression that these organ systems are all stable and unchanged.    The patient will continue aggressive use of the  lymph pump.  This will continue to improve the edema control and prevent sequela such as ulcers and infections.   The patient will follow-up with me on an annual basis.    2. Lymphedema  No surgery or intervention at this point in time.    I have reviewed my discussion with the patient regarding lymphedema and why it  causes symptoms.  Patient will continue wearing graduated compression stockings class 1 (20-30 mmHg) on a daily basis a prescription was given. The patient is reminded to put the stockings on first thing in the morning and removing them in the evening. The patient is instructed specifically not to sleep in the stockings.   In addition, behavioral modification throughout the day will be continued.  This will include frequent elevation (such as in a recliner), use of over the counter pain medications as needed and exercise such  as walking.  I have reviewed systemic causes for chronic edema such as liver, kidney and cardiac etiologies and there does not appear to be any significant changes in these organ systems over the past year.  The patient is under the impression that these organ systems are all stable and unchanged.    The patient will continue aggressive use of the  lymph pump.  This will continue to improve the edema control and prevent sequela such as ulcers and infections.   The patient will follow-up with me on an annual basis.    3. OSA on CPAP I have stressed the need to use CPAP regularly and the relationship of OSA to leg swelling   Hortencia Pilar, MD  04/15/2018 11:13 AM

## 2018-04-17 ENCOUNTER — Encounter (INDEPENDENT_AMBULATORY_CARE_PROVIDER_SITE_OTHER): Payer: Self-pay | Admitting: Vascular Surgery

## 2018-04-26 ENCOUNTER — Ambulatory Visit: Payer: Medicare Other | Admitting: Podiatry

## 2018-04-27 DIAGNOSIS — Z9989 Dependence on other enabling machines and devices: Secondary | ICD-10-CM | POA: Insufficient documentation

## 2018-05-03 ENCOUNTER — Encounter (INDEPENDENT_AMBULATORY_CARE_PROVIDER_SITE_OTHER): Payer: Self-pay | Admitting: Vascular Surgery

## 2018-05-03 ENCOUNTER — Ambulatory Visit (INDEPENDENT_AMBULATORY_CARE_PROVIDER_SITE_OTHER): Payer: Medicare Other | Admitting: Vascular Surgery

## 2018-05-03 ENCOUNTER — Telehealth (INDEPENDENT_AMBULATORY_CARE_PROVIDER_SITE_OTHER): Payer: Self-pay | Admitting: Vascular Surgery

## 2018-05-03 VITALS — BP 113/63 | HR 70 | Resp 20 | Ht 70.5 in | Wt 297.0 lb

## 2018-05-03 DIAGNOSIS — I872 Venous insufficiency (chronic) (peripheral): Secondary | ICD-10-CM

## 2018-05-03 DIAGNOSIS — L97319 Non-pressure chronic ulcer of right ankle with unspecified severity: Secondary | ICD-10-CM

## 2018-05-03 DIAGNOSIS — I83013 Varicose veins of right lower extremity with ulcer of ankle: Secondary | ICD-10-CM | POA: Diagnosis not present

## 2018-05-03 DIAGNOSIS — I890 Lymphedema, not elsewhere classified: Secondary | ICD-10-CM

## 2018-05-03 NOTE — Telephone Encounter (Signed)
Tammy called the patient back and made him an appointment already.

## 2018-05-04 ENCOUNTER — Encounter (INDEPENDENT_AMBULATORY_CARE_PROVIDER_SITE_OTHER): Payer: Self-pay

## 2018-05-04 NOTE — Telephone Encounter (Signed)
Got it.

## 2018-05-08 ENCOUNTER — Encounter (INDEPENDENT_AMBULATORY_CARE_PROVIDER_SITE_OTHER): Payer: Self-pay | Admitting: Vascular Surgery

## 2018-05-08 DIAGNOSIS — L97319 Non-pressure chronic ulcer of right ankle with unspecified severity: Secondary | ICD-10-CM

## 2018-05-08 DIAGNOSIS — L97309 Non-pressure chronic ulcer of unspecified ankle with unspecified severity: Secondary | ICD-10-CM | POA: Insufficient documentation

## 2018-05-08 DIAGNOSIS — I83013 Varicose veins of right lower extremity with ulcer of ankle: Secondary | ICD-10-CM | POA: Insufficient documentation

## 2018-05-08 NOTE — Progress Notes (Signed)
MRN : [id number]  Justin Shannon. is a 77 y.o. (January 28, 1941) male who presents with chief complaint of  Chief Complaint  Patient presents with  . Follow-up    New wounds, check Right leg  .  History of Present Illness:   Patient is seen for evaluation of leg pain and swelling associated with new onset ulceration. The patient first noticed the swelling remotely. The swelling is associated with pain and discoloration. The pain and swelling worsens with prolonged dependency and improves with elevation. The pain is unrelated to activity.  The patient notes that in the morning the legs are better but the leg symptoms worsened throughout the course of the day. The patient has also noted a progressive worsening of the discoloration in the ankle and shin area.   The patient notes that an ulcer has developed acutely without specific trauma and since it occurred it has been very slow to heal.  There is a moderate amount of drainage associated with the open area.  The wound is also very painful.  The patient denies claudication symptoms or rest pain symptoms.  The patient denies DJD and LS spine disease.  The patient has not had any past angiography, interventions or vascular surgery.  Elevation makes the leg symptoms better, dependency makes them much worse. The patient denies any recent changes in medications.  The patient has not been wearing graduated compression.  The patient denies a history of DVT or PE. There is no prior history of phlebitis. There is no history of primary lymphedema.  No history of malignancies. No history of trauma or groin or pelvic surgery. There is no history of radiation treatment to the groin or pelvis       Current Meds  Medication Sig  . allopurinol (ZYLOPRIM) 100 MG tablet Take 100 mg by mouth daily.  Marland Kitchen aspirin 81 MG tablet Take 81 mg by mouth daily.  Marland Kitchen diltiazem (CARDIZEM LA) 240 MG 24 hr tablet Take 240 mg by mouth daily.  Marland Kitchen docusate sodium (COLACE)  100 MG capsule Take 100 mg by mouth daily.  Marland Kitchen doxycycline (VIBRA-TABS) 100 MG tablet   . fexofenadine (ALLEGRA) 180 MG tablet Take 180 mg by mouth daily as needed.  . fluconazole (DIFLUCAN) 100 MG tablet Take 100 mg by mouth daily.  . fluticasone (FLONASE) 50 MCG/ACT nasal spray Place into the nose.  . furosemide (LASIX) 40 MG tablet   . hydrochlorothiazide (HYDRODIURIL) 25 MG tablet Take 25 mg by mouth daily.  Marland Kitchen levETIRAcetam (KEPPRA) 500 MG tablet Take 1 tablet by mouth two  times daily  . losartan (COZAAR) 100 MG tablet Take 50 mg by mouth daily.   . metoprolol succinate (TOPROL-XL) 50 MG 24 hr tablet   . Multiple Vitamin (ONE-A-DAY MENS PO) Take by mouth daily.  Marland Kitchen nystatin (MYCOSTATIN/NYSTOP) powder Apply topically.  Marland Kitchen olmesartan-hydrochlorothiazide (BENICAR HCT) 40-12.5 MG tablet TAKE 1 TABLET BY MOUTH ONCE DAILY  . Rivaroxaban (XARELTO) 15 MG TABS tablet Take 20 mg by mouth.   . torsemide (DEMADEX) 20 MG tablet     Past Medical History:  Diagnosis Date  . Arthritis   . Bilateral swelling of feet   . BPH (benign prostatic hyperplasia)   . DVT (deep venous thrombosis) (HCC)    right lower extremity  . History of gout   . Hypertension   . Tendonitis of foot    left    Past Surgical History:  Procedure Laterality Date  . APPENDECTOMY    .  HERNIA REPAIR      Social History Social History   Tobacco Use  . Smoking status: Former Research scientist (life sciences)  . Smokeless tobacco: Never Used  . Tobacco comment: 25 years ago quit  Substance Use Topics  . Alcohol use: No  . Drug use: No    Family History Family History  Problem Relation Age of Onset  . Arthritis Mother   . Hypertension Mother     Allergies  Allergen Reactions  . Shellfish Allergy      REVIEW OF SYSTEMS (Negative unless checked)  Constitutional: [] Weight loss  [] Fever  [] Chills Cardiac: [] Chest pain   [] Chest pressure   [] Palpitations   [] Shortness of breath when laying flat   [] Shortness of breath with  exertion. Vascular:  [] Pain in legs with walking   [] Pain in legs at rest  [] History of DVT   [] Phlebitis   [x] Swelling in legs   [] Varicose veins   [x] Non-healing ulcers Pulmonary:   [] Uses home oxygen   [] Productive cough   [] Hemoptysis   [] Wheeze  [] COPD   [] Asthma Neurologic:  [] Dizziness   [] Seizures   [] History of stroke   [] History of TIA  [] Aphasia   [] Vissual changes   [] Weakness or numbness in arm   [] Weakness or numbness in leg Musculoskeletal:   [] Joint swelling   [] Joint pain   [] Low back pain Hematologic:  [] Easy bruising  [] Easy bleeding   [] Hypercoagulable state   [] Anemic Gastrointestinal:  [] Diarrhea   [] Vomiting  [] Gastroesophageal reflux/heartburn   [] Difficulty swallowing. Genitourinary:  [] Chronic kidney disease   [] Difficult urination  [] Frequent urination   [] Blood in urine Skin:  [x] Rashes   [x] Ulcers  Psychological:  [] History of anxiety   []  History of major depression.  Physical Examination  Vitals:   05/03/18 1150  BP: 113/63  Pulse: 70  Resp: 20  Weight: 297 lb (134.7 kg)  Height: 5' 10.5" (1.791 m)   Body mass index is 42.01 kg/m. Gen: WD/WN, NAD Head: [place]/AT, No temporalis wasting.  Ear/Nose/Throat: Hearing grossly intact, nares w/o erythema or drainage Eyes: PER, EOMI, sclera nonicteric.  Neck: Supple, no large masses.   Pulmonary:  Good air movement, no audible wheezing bilaterally, no use of accessory muscles.  Cardiac: RRR, no JVD Vascular: 2-3+ edema of the right leg with severe venous changes of the right leg.  Venous ulcer noted in the ankle area on the right, noninfected Vessel Right Left  Radial Palpable Palpable  PT Palpable Palpable  DP Palpable Palpable  Gastrointestinal: Non-distended. No guarding/no peritoneal signs.  Musculoskeletal: M/S 5/5 throughout.  No deformity or atrophy.  Neurologic: CN 2-12 intact. Symmetrical.  Speech is fluent. Motor exam as listed above. Psychiatric: Judgment intact, Mood & affect appropriate for pt's  clinical situation. Dermatologic: Venous stasis dermatitis with ulcers present on the right.  No changes consistent with cellulitis. Lymph : No lichenification or skin changes of chronic lymphedema.  CBC Lab Results  Component Value Date   WBC 8.3 10/23/2012   HGB 12.6 (L) 10/23/2012   HCT 39.4 (L) 10/23/2012   MCV 72 (L) 10/23/2012   PLT 174 10/23/2012    BMET    Component Value Date/Time   NA 140 10/23/2012 0519   K 4.4 10/23/2012 0519   CL 109 (H) 10/23/2012 0519   CO2 24 10/23/2012 0519   GLUCOSE 144 (H) 10/23/2012 0519   BUN 26 (H) 10/23/2012 0519   CREATININE 1.73 (H) 10/23/2012 0519   CALCIUM 9.6 10/23/2012 0519   GFRNONAA 39 (L) 10/23/2012  0519   GFRAA 45 (L) 10/23/2012 0519   CrCl cannot be calculated ([name] most recent lab result is older than the maximum 21 days allowed.).  COAG Lab Results  Component Value Date   INR 1.1 09/04/2011   INR 1.0 09/02/2011    Radiology No results found.   Assessment/Plan 1. Venous ulcer of ankle, right (HCC) No surgery or intervention at this point in time.    I have had a long discussion with the patient regarding venous insufficiency and why it  causes symptoms, specifically venous ulceration . I have discussed with the patient the chronic skin changes that accompany venous insufficiency and the long term sequela such as infection and recurring  ulceration.  Patient will be placed in [employer] which will be changed weekly drainage permitting.  In addition, behavioral modification including several periods of elevation of the lower extremities during the day will be continued. Achieving a position with the ankles at heart level was stressed to the patient  The patient is instructed to begin routine exercise, especially walking on a daily basis  Patient should undergo duplex ultrasound of the venous system to ensure that DVT or reflux is not present.  Following the review of the ultrasound the patient will follow up  in one week to reassess the degree of swelling and the control that Unna therapy is offering.   The patient can be assessed for graduated compression stockings or wraps as well as a Lymph Pump once the ulcers are healed.   2. Chronic venous insufficiency No surgery or intervention at this point in time.    I have had a long discussion with the patient regarding venous insufficiency and why it  causes symptoms. I have discussed with the patient the chronic skin changes that accompany venous insufficiency and the long term sequela such as infection and ulceration.  Patient will begin wearing graduated compression stockings class 1 (20-30 mmHg) or compression wraps on a daily basis a prescription was given. The patient will put the stockings on first thing in the morning and removing them in the evening. The patient is instructed specifically not to sleep in the stockings.    In addition, behavioral modification including several periods of elevation of the lower extremities during the day will be continued. I have demonstrated that proper elevation is a position with the ankles at heart level.  The patient is instructed to begin routine exercise, especially walking on a daily basis   3. Lymphedema No surgery or intervention at this point in time.    I have had a long discussion with the patient regarding venous insufficiency and why it  causes symptoms. I have discussed with the patient the chronic skin changes that accompany venous insufficiency and the long term sequela such as infection and ulceration.  Patient will begin wearing graduated compression stockings class 1 (20-30 mmHg) or compression wraps on a daily basis a prescription was given. The patient will put the stockings on first thing in the morning and removing them in the evening. The patient is instructed specifically not to sleep in the stockings.    In addition, behavioral modification including several periods of elevation of the  lower extremities during the day will be continued. I have demonstrated that proper elevation is a position with the ankles at heart level.  The patient is instructed to begin routine exercise, especially walking on a daily basis     Hortencia Pilar, MD  05/08/2018 12:26 PM

## 2018-05-10 ENCOUNTER — Ambulatory Visit: Payer: Medicare Other | Admitting: Podiatry

## 2018-05-10 ENCOUNTER — Ambulatory Visit (INDEPENDENT_AMBULATORY_CARE_PROVIDER_SITE_OTHER): Payer: Medicare Other | Admitting: Nurse Practitioner

## 2018-05-10 ENCOUNTER — Encounter (INDEPENDENT_AMBULATORY_CARE_PROVIDER_SITE_OTHER): Payer: Self-pay | Admitting: Nurse Practitioner

## 2018-05-10 VITALS — BP 122/63 | HR 71 | Resp 16 | Ht 70.0 in | Wt 295.8 lb

## 2018-05-10 DIAGNOSIS — I129 Hypertensive chronic kidney disease with stage 1 through stage 4 chronic kidney disease, or unspecified chronic kidney disease: Secondary | ICD-10-CM | POA: Diagnosis not present

## 2018-05-10 DIAGNOSIS — N183 Chronic kidney disease, stage 3 unspecified: Secondary | ICD-10-CM

## 2018-05-10 DIAGNOSIS — L97319 Non-pressure chronic ulcer of right ankle with unspecified severity: Secondary | ICD-10-CM

## 2018-05-10 DIAGNOSIS — I890 Lymphedema, not elsewhere classified: Secondary | ICD-10-CM

## 2018-05-10 DIAGNOSIS — Z87891 Personal history of nicotine dependence: Secondary | ICD-10-CM

## 2018-05-10 DIAGNOSIS — I83013 Varicose veins of right lower extremity with ulcer of ankle: Secondary | ICD-10-CM | POA: Diagnosis not present

## 2018-05-10 MED ORDER — DOXYCYCLINE HYCLATE 100 MG PO CAPS: 100.0000 mg | ORAL_CAPSULE | Freq: Two times a day (BID) | ORAL | 0 refills | Status: DC

## 2018-05-10 NOTE — Progress Notes (Signed)
Subjective:    Patient ID: Justin Shannon., male    DOB: 1941/03/08, 77 y.o.   MRN: [id number] Chief Complaint  Patient presents with  . Follow-up    1week follow up     HPI  [facility]. is a 77 y.o. male that is seen for follow up evaluation of leg pain and swelling associated with venous ulceration. The patient was recently seen here and started on Unna boot therapy.  The swelling abruptly became much worse bilaterally and is associated with pain and discoloration. The pain and swelling worsens with prolonged dependency and improves with elevation.  The patient notes that in the morning the legs are better but the leg symptoms worsened throughout the course of the day. The patient has also noted a progressive worsening of the discoloration in the ankle and shin area.   The patient notes that an ulcer has developed acutely without specific trauma and since it occurred it has been very slow to heal.  There is a moderate amount of drainage associated with the open area.  The wound is also very painful.  Patient has tolerated Unna Wrap well.    The patient states that they have been elevating as much as possible. The patient denies any recent changes in medications.  The patient denies a history of DVT or PE. There is no prior history of phlebitis. There is no history of primary lymphedema.  No SOB or increased cough.  No sputum production.  No recent episodes of CHF exacerbation.   Past Medical History:  Diagnosis Date  . Arthritis   . Bilateral swelling of feet   . BPH (benign prostatic hyperplasia)   . DVT (deep venous thrombosis) (HCC)    right lower extremity  . History of gout   . Hypertension   . Tendonitis of foot    left    Past Surgical History:  Procedure Laterality Date  . APPENDECTOMY    . HERNIA REPAIR      Social History   Socioeconomic History  . Marital status: Married    Spouse name: Not on file  . Number of children: Not on file  . Years  of education: Not on file  . Highest education level: Not on file  Occupational History  . Not on file  Social Needs  . Financial resource strain: Not on file  . Food insecurity:    Worry: Not on file    Inability: Not on file  . Transportation needs:    Medical: Not on file    Non-medical: Not on file  Tobacco Use  . Smoking status: Former Research scientist (life sciences)  . Smokeless tobacco: Never Used  . Tobacco comment: 25 years ago quit  Substance and Sexual Activity  . Alcohol use: No  . Drug use: No  . Sexual activity: Not on file  Lifestyle  . Physical activity:    Days per week: Not on file    Minutes per session: Not on file  . Stress: Not on file  Relationships  . Social connections:    Talks on phone: Not on file    Gets together: Not on file    Attends religious service: Not on file    Active member of club or organization: Not on file    Attends meetings of clubs or organizations: Not on file    Relationship status: Not on file  . Intimate partner violence:    Fear of current or ex partner: Not on file  Emotionally abused: Not on file    Physically abused: Not on file    Forced sexual activity: Not on file  Other Topics Concern  . Not on file  Social History Narrative  . Not on file    Family History  Problem Relation Age of Onset  . Arthritis Mother   . Hypertension Mother     Allergies  Allergen Reactions  . Shellfish Allergy      Review of Systems   Review of Systems: Negative Unless Checked Constitutional: [] Weight loss  [] Fever  [] Chills Cardiac: [] Chest pain   []  Atrial Fibrillation  [] Palpitations   [] Shortness of breath when laying flat   [] Shortness of breath with exertion. Vascular:  [] Pain in legs with walking   [] Pain in legs with standing  [] History of DVT   [] Phlebitis   [x] Swelling in legs   [] Varicose veins   [x] Non-healing ulcers Pulmonary:   [] Uses home oxygen   [] Productive cough   [] Hemoptysis   [] Wheeze  [] COPD   [] Asthma Neurologic:   [] Dizziness   [x] Seizures   [] History of stroke   [] History of TIA  [] Aphasia   [] Vissual changes   [] Weakness or numbness in arm   [] Weakness or numbness in leg Musculoskeletal:   [] Joint swelling   [] Joint pain   [] Low back pain  []  History of Knee Replacement Hematologic:  [] Easy bruising  [] Easy bleeding   [] Hypercoagulable state   [] Anemic Gastrointestinal:  [] Diarrhea   [] Vomiting  [] Gastroesophageal reflux/heartburn   [] Difficulty swallowing. Genitourinary:  [] Chronic kidney disease   [] Difficult urination  [] Anuric   [] Blood in urine Skin:  [] Rashes   [x] Ulcers  Psychological:  [] History of anxiety   []  History of major depression  []  Memory Difficulties     Objective:   Physical Exam BP 122/63 (BP Location: Right Arm)   Pulse 71   Resp 16   Ht 5\' 10"  (1.778 m)   Wt 295 lb 12.8 oz (134.2 kg)   BMI 42.44 kg/m   Gen: WD/WN, NAD Head: [place]/AT, No temporalis wasting.  Ear/Nose/Throat: Hearing grossly intact, nares w/o erythema or drainage Eyes: PER, EOMI, sclera nonicteric.  Neck: Supple, no masses.  No JVD.  Pulmonary:  Good air movement, no use of accessory muscles.  Cardiac: RRR Vascular: Leg appears reddened in areas, small venous ulceration area,  Vessel Right Left  Radial Palpable Palpable  Dorsalis Pedis Palpable Palpable  Gastrointestinal: soft, non-distended. No guarding/no peritoneal signs.  Musculoskeletal: M/S 5/5 throughout.  No deformity or atrophy.  Neurologic: Pain and light touch intact in extremities.  Symmetrical.  Speech is fluent. Motor exam as listed above. Psychiatric: Judgment intact, Mood & affect appropriate for pt's clinical situation. Dermatologic: No Venous rashes. Small shallow venous ulcer  No changes consistent with cellulitis. Lymph : No Cervical lymphadenopathy,lichenification of right lower extremity     Assessment & Plan:   1. Venous ulcer of ankle, right (HCC) No surgery or intervention at this point in time.    I have had a long  discussion with the patient regarding venous insufficiency and why it  causes symptoms, specifically venous ulceration . I have discussed with the patient the chronic skin changes that accompany venous insufficiency and the long term sequela such as infection and recurring  ulceration.  Patient will be placed in [employer] which will be changed weekly drainage permitting.  In addition, behavioral modification including several periods of elevation of the lower extremities during the day will be continued. Achieving a position with  the ankles at heart level was stressed to the patient  The patient is instructed to begin routine exercise, especially walking on a daily basis  Patient will follow up in four weeks for unna boots change.  Doxycycline due to increasing level of redness and irritation.  Will assess in four weeks.   - doxycycline (VIBRAMYCIN) 100 MG capsule; Take 1 capsule (100 mg total) by mouth 2 (two) times daily.  Dispense: 28 capsule; Refill: 0  2. Lymphedema I have had a long discussion with the patient regarding swelling and why it  causes symptoms.  Patient will begin wearing graduated compression stockings class 1 (20-30 mmHg) on a daily basis a prescription was given. The patient will  beginning wearing the stockings first thing in the morning and removing them in the evening. The patient is instructed specifically not to sleep in the stockings.   In addition, behavioral modification will be initiated.  This will include frequent elevation, use of over the counter pain medications and exercise such as walking.  I have reviewed systemic causes for chronic edema such as liver, kidney and cardiac etiologies.  The patient denies problems with these organ systems.    Consideration for a lymph pump will also be made based upon the effectiveness of conservative therapy.  This would help to improve the edema control and prevent sequela such as ulcers and infections     3. Benign  hypertension with chronic kidney disease, stage III (HCC) Continue antihypertensive medications as already ordered, these medications have been reviewed and there are no changes at this time.    Current Outpatient Medications on File Prior to Visit  Medication Sig Dispense Refill  . allopurinol (ZYLOPRIM) 100 MG tablet Take 100 mg by mouth daily.    Marland Kitchen aspirin 81 MG tablet Take 81 mg by mouth daily.    Marland Kitchen diltiazem (CARDIZEM LA) 240 MG 24 hr tablet Take 240 mg by mouth daily.    Marland Kitchen docusate sodium (COLACE) 100 MG capsule Take 100 mg by mouth daily.    Marland Kitchen doxycycline (VIBRA-TABS) 100 MG tablet     . fexofenadine (ALLEGRA) 180 MG tablet Take 180 mg by mouth daily as needed.    . fluconazole (DIFLUCAN) 100 MG tablet Take 100 mg by mouth daily.  0  . fluticasone (FLONASE) 50 MCG/ACT nasal spray Place into the nose.    . furosemide (LASIX) 40 MG tablet     . hydrochlorothiazide (HYDRODIURIL) 25 MG tablet Take 25 mg by mouth daily.    Marland Kitchen levETIRAcetam (KEPPRA) 500 MG tablet Take 1 tablet by mouth two  times daily    . losartan (COZAAR) 100 MG tablet Take 50 mg by mouth daily.     . metoprolol succinate (TOPROL-XL) 50 MG 24 hr tablet     . Multiple Vitamin (ONE-A-DAY MENS PO) Take by mouth daily.    Marland Kitchen nystatin (MYCOSTATIN/NYSTOP) powder Apply topically.    Marland Kitchen olmesartan-hydrochlorothiazide (BENICAR HCT) 40-12.5 MG tablet TAKE 1 TABLET BY MOUTH ONCE DAILY    . Rivaroxaban (XARELTO) 15 MG TABS tablet Take 20 mg by mouth.     . torsemide (DEMADEX) 20 MG tablet      No current facility-administered medications on file prior to visit.     There are no Patient Instructions on file for this visit. No follow-ups on file.   Kris Hartmann, NP  This note was completed with Sales executive.  Any errors are purely unintentional.

## 2018-05-17 ENCOUNTER — Ambulatory Visit (INDEPENDENT_AMBULATORY_CARE_PROVIDER_SITE_OTHER): Payer: Medicare Other | Admitting: Nurse Practitioner

## 2018-05-17 ENCOUNTER — Encounter (INDEPENDENT_AMBULATORY_CARE_PROVIDER_SITE_OTHER): Payer: Self-pay

## 2018-05-17 VITALS — BP 113/64 | HR 78 | Resp 16 | Ht 70.0 in | Wt 294.6 lb

## 2018-05-17 DIAGNOSIS — I83013 Varicose veins of right lower extremity with ulcer of ankle: Secondary | ICD-10-CM

## 2018-05-17 DIAGNOSIS — L97319 Non-pressure chronic ulcer of right ankle with unspecified severity: Secondary | ICD-10-CM | POA: Diagnosis not present

## 2018-05-17 NOTE — Progress Notes (Signed)
History of Present Illness  There is no documented history at this time  Assessments & Plan   There are no diagnoses linked to this encounter.    Additional instructions  Subjective:  Patient presents with venous ulcer of the Right lower extremity.    Procedure:  3 layer unna wrap was placed Right lower extremity.   Plan:   Follow up in one week.   

## 2018-05-24 ENCOUNTER — Ambulatory Visit (INDEPENDENT_AMBULATORY_CARE_PROVIDER_SITE_OTHER): Payer: Medicare Other | Admitting: Nurse Practitioner

## 2018-05-24 ENCOUNTER — Encounter (INDEPENDENT_AMBULATORY_CARE_PROVIDER_SITE_OTHER): Payer: Self-pay

## 2018-05-24 VITALS — BP 119/65 | HR 83 | Resp 16 | Ht 70.0 in | Wt 291.0 lb

## 2018-05-24 DIAGNOSIS — L97319 Non-pressure chronic ulcer of right ankle with unspecified severity: Secondary | ICD-10-CM

## 2018-05-24 DIAGNOSIS — I83013 Varicose veins of right lower extremity with ulcer of ankle: Secondary | ICD-10-CM | POA: Diagnosis not present

## 2018-05-24 NOTE — Progress Notes (Signed)
History of Present Illness  There is no documented history at this time  Assessments & Plan   There are no diagnoses linked to this encounter.    Additional instructions  Subjective:  Patient presents with venous ulcer of the Right lower extremity.    Procedure:  3 layer unna wrap was placed Right lower extremity.   Plan:   Follow up in one week.   

## 2018-05-31 ENCOUNTER — Encounter (INDEPENDENT_AMBULATORY_CARE_PROVIDER_SITE_OTHER): Payer: Medicare Other

## 2018-06-01 ENCOUNTER — Encounter (INDEPENDENT_AMBULATORY_CARE_PROVIDER_SITE_OTHER): Payer: Self-pay

## 2018-06-01 ENCOUNTER — Ambulatory Visit (INDEPENDENT_AMBULATORY_CARE_PROVIDER_SITE_OTHER): Payer: Medicare Other | Admitting: Nurse Practitioner

## 2018-06-01 VITALS — BP 110/56 | HR 88 | Resp 17 | Ht 71.0 in | Wt 287.0 lb

## 2018-06-01 DIAGNOSIS — L97319 Non-pressure chronic ulcer of right ankle with unspecified severity: Secondary | ICD-10-CM | POA: Diagnosis not present

## 2018-06-01 DIAGNOSIS — I83013 Varicose veins of right lower extremity with ulcer of ankle: Secondary | ICD-10-CM

## 2018-06-01 NOTE — Progress Notes (Signed)
History of Present Illness  There is no documented history at this time  Assessments & Plan   There are no diagnoses linked to this encounter.    Additional instructions  Subjective:  Patient presents with venous ulcer of the Right lower extremity.    Procedure:  3 layer unna wrap was placed Right lower extremity.   Plan:   Follow up in one week.   

## 2018-06-07 ENCOUNTER — Ambulatory Visit (INDEPENDENT_AMBULATORY_CARE_PROVIDER_SITE_OTHER): Payer: Medicare Other | Admitting: Nurse Practitioner

## 2018-06-07 ENCOUNTER — Encounter (INDEPENDENT_AMBULATORY_CARE_PROVIDER_SITE_OTHER): Payer: Self-pay | Admitting: Nurse Practitioner

## 2018-06-07 VITALS — BP 101/55 | HR 74 | Resp 20 | Ht 70.0 in | Wt 289.0 lb

## 2018-06-07 DIAGNOSIS — I129 Hypertensive chronic kidney disease with stage 1 through stage 4 chronic kidney disease, or unspecified chronic kidney disease: Secondary | ICD-10-CM

## 2018-06-07 DIAGNOSIS — I890 Lymphedema, not elsewhere classified: Secondary | ICD-10-CM | POA: Diagnosis not present

## 2018-06-07 DIAGNOSIS — I83013 Varicose veins of right lower extremity with ulcer of ankle: Secondary | ICD-10-CM

## 2018-06-07 DIAGNOSIS — N183 Chronic kidney disease, stage 3 unspecified: Secondary | ICD-10-CM

## 2018-06-07 DIAGNOSIS — L97319 Non-pressure chronic ulcer of right ankle with unspecified severity: Secondary | ICD-10-CM | POA: Diagnosis not present

## 2018-06-08 ENCOUNTER — Encounter (INDEPENDENT_AMBULATORY_CARE_PROVIDER_SITE_OTHER): Payer: Self-pay | Admitting: Nurse Practitioner

## 2018-06-08 NOTE — Progress Notes (Signed)
Subjective:    Patient ID: Justin Shannon., male    DOB: May 15, 1941, 77 y.o.   MRN: [id number] Chief Complaint  Patient presents with  . Follow-up    4 week Unna boot check    HPI  Justin Shannon. is a 77 y.o. male that is following up today for an unna wrap check.  He states that he still has some discomfort in his lower extremity and issue with a very small dime sized ulceration.  Otherwise, he states that he has no issues with actually wearing the wraps.  Patient denies any fever, chills, nausea, vomiting or diarrhea.  Patient denies any claudication-like symptoms or diarrhea.  Patient also has much less redness of his bilateral lower extremities.  Patient successfully completed the antibiotic regimen doxycycline was given with concern for cellulitis at the last unna wrap check.   Past Medical History:  Diagnosis Date  . Arthritis   . Bilateral swelling of feet   . BPH (benign prostatic hyperplasia)   . DVT (deep venous thrombosis) (HCC)    right lower extremity  . History of gout   . Hypertension   . Tendonitis of foot    left    Past Surgical History:  Procedure Laterality Date  . APPENDECTOMY    . HERNIA REPAIR      Social History   Socioeconomic History  . Marital status: Married    Spouse name: Not on file  . Number of children: Not on file  . Years of education: Not on file  . Highest education level: Not on file  Occupational History  . Not on file  Social Needs  . Financial resource strain: Not on file  . Food insecurity:    Worry: Not on file    Inability: Not on file  . Transportation needs:    Medical: Not on file    Non-medical: Not on file  Tobacco Use  . Smoking status: Former Research scientist (life sciences)  . Smokeless tobacco: Never Used  . Tobacco comment: 25 years ago quit  Substance and Sexual Activity  . Alcohol use: No  . Drug use: No  . Sexual activity: Not on file  Lifestyle  . Physical activity:    Days per week: Not on file    Minutes per  session: Not on file  . Stress: Not on file  Relationships  . Social connections:    Talks on phone: Not on file    Gets together: Not on file    Attends religious service: Not on file    Active member of club or organization: Not on file    Attends meetings of clubs or organizations: Not on file    Relationship status: Not on file  . Intimate partner violence:    Fear of current or ex partner: Not on file    Emotionally abused: Not on file    Physically abused: Not on file    Forced sexual activity: Not on file  Other Topics Concern  . Not on file  Social History Narrative  . Not on file    Family History  Problem Relation Age of Onset  . Arthritis Mother   . Hypertension Mother     Allergies  Allergen Reactions  . Shellfish Allergy      Review of Systems   Review of Systems: Negative Unless Checked Constitutional: [] Weight loss  [] Fever  [] Chills Cardiac: [] Chest pain   []  Atrial Fibrillation  [] Palpitations   [] Shortness of breath when laying  flat   [] Shortness of breath with exertion. Vascular:  [] Pain in legs with walking   [] Pain in legs with standing  [x] History of DVT   [] Phlebitis   [x] Swelling in legs   [] Varicose veins   [x] Non-healing ulcers Pulmonary:   [] Uses home oxygen   [] Productive cough   [] Hemoptysis   [] Wheeze  [] COPD   [] Asthma Neurologic:  [] Dizziness   [] Seizures   [] History of stroke   [] History of TIA  [] Aphasia   [] Vissual changes   [] Weakness or numbness in arm   [x] Weakness or numbness in leg Musculoskeletal:   [x] Joint swelling   [x] Joint pain   [x] Low back pain  []  History of Knee Replacement Hematologic:  [] Easy bruising  [] Easy bleeding   [] Hypercoagulable state   [] Anemic Gastrointestinal:  [] Diarrhea   [] Vomiting  [] Gastroesophageal reflux/heartburn   [] Difficulty swallowing. Genitourinary:  [] Chronic kidney disease   [] Difficult urination  [] Anuric   [] Blood in urine Skin:  [] Rashes   [] Ulcers  Psychological:  [] History of anxiety   []   History of major depression  []  Memory Difficulties     Objective:   Physical Exam  BP (!) 101/55 (BP Location: Right Arm, Patient Position: Sitting)   Pulse 74   Resp 20   Ht 5\' 10"  (1.778 m)   Wt 289 lb (131.1 kg)   BMI 41.47 kg/m   Gen: WD/WN, NAD Head: [place]/AT, No temporalis wasting.  Ear/Nose/Throat: Hearing grossly intact, nares w/o erythema or drainage Eyes: PER, EOMI, sclera nonicteric.  Neck: Supple, no masses.  No JVD.  Pulmonary:  Good air movement, no use of accessory muscles.  Cardiac: RRR Vascular:  2+ pitting edema bilaterally Vessel Right Left  Radial Palpable Palpable   Gastrointestinal: soft, non-distended. No guarding/no peritoneal signs.  Musculoskeletal: Uses cane for ambulation No deformity or atrophy.  Neurologic: Pain and light touch intact in extremities.  Symmetrical.  Speech is fluent. Motor exam as listed above. Psychiatric: Judgment intact, Mood & affect appropriate for pt's clinical situation. Dermatologic: No Venous rashes. No Ulcers Noted.  No changes consistent with cellulitis. Lymph : No Cervical lymphadenopathy, lichenification bilateral lower extremities     Assessment & Plan:   1. Lymphedema The patient has been utilizing a wrap therapy consistently for the last 4 weeks.  However despite Unna wrap usage still has a very small dime size ulceration.  We will place the patient back in wraps for 4 weeks and we will reevaluate his wound healing.  I have again emphasized with the patient to utilize other conservative therapy measures for treating swelling such as elevation of lower extremities, exercise when possible as well as over-the-counter pain medicine.  Also advised him to get a pair of medical grade 1 compression stockings.  In order to have when he is out of milligrams to prevent a further exacerbation of lymphedema as well as new ulceration.  2. Venous ulcer of ankle, right ([place]) See above  3. Benign hypertension with chronic kidney  disease, stage III (HCC) Continue antihypertensive medications as already ordered, these medications have been reviewed and there are no changes at this time.    Current Outpatient Medications on File Prior to Visit  Medication Sig Dispense Refill  . allopurinol (ZYLOPRIM) 100 MG tablet Take 100 mg by mouth daily.    Marland Kitchen aspirin 81 MG tablet Take 81 mg by mouth daily.    Marland Kitchen diltiazem (CARDIZEM LA) 240 MG 24 hr tablet Take 240 mg by mouth daily.    Marland Kitchen docusate sodium (  COLACE) 100 MG capsule Take 100 mg by mouth daily.    Marland Kitchen doxycycline (VIBRA-TABS) 100 MG tablet     . doxycycline (VIBRAMYCIN) 100 MG capsule Take 1 capsule (100 mg total) by mouth 2 (two) times daily. 28 capsule 0  . fexofenadine (ALLEGRA) 180 MG tablet Take 180 mg by mouth daily as needed.    . fluconazole (DIFLUCAN) 100 MG tablet Take 100 mg by mouth daily.  0  . fluticasone (FLONASE) 50 MCG/ACT nasal spray Place into the nose.    . furosemide (LASIX) 40 MG tablet     . hydrochlorothiazide (HYDRODIURIL) 25 MG tablet Take 25 mg by mouth daily.    Marland Kitchen levETIRAcetam (KEPPRA) 500 MG tablet Take 1 tablet by mouth two  times daily    . losartan (COZAAR) 100 MG tablet Take 50 mg by mouth daily.     . metoprolol succinate (TOPROL-XL) 50 MG 24 hr tablet     . Multiple Vitamin (ONE-A-DAY MENS PO) Take by mouth daily.    Marland Kitchen nystatin (MYCOSTATIN/NYSTOP) powder Apply topically.    Marland Kitchen olmesartan-hydrochlorothiazide (BENICAR HCT) 40-12.5 MG tablet TAKE 1 TABLET BY MOUTH ONCE DAILY    . Rivaroxaban (XARELTO) 15 MG TABS tablet Take 20 mg by mouth.     . torsemide (DEMADEX) 20 MG tablet      No current facility-administered medications on file prior to visit.     There are no Patient Instructions on file for this visit. No follow-ups on file.   Kris Hartmann, NP  This note was completed with Sales executive.  Any errors are purely unintentional.

## 2018-06-14 ENCOUNTER — Encounter (INDEPENDENT_AMBULATORY_CARE_PROVIDER_SITE_OTHER): Payer: Self-pay

## 2018-06-14 ENCOUNTER — Ambulatory Visit (INDEPENDENT_AMBULATORY_CARE_PROVIDER_SITE_OTHER): Payer: Medicare Other | Admitting: Nurse Practitioner

## 2018-06-14 ENCOUNTER — Other Ambulatory Visit: Payer: Self-pay

## 2018-06-14 VITALS — BP 109/65 | HR 82 | Ht 70.0 in | Wt 289.0 lb

## 2018-06-14 DIAGNOSIS — I890 Lymphedema, not elsewhere classified: Secondary | ICD-10-CM | POA: Diagnosis not present

## 2018-06-14 NOTE — Progress Notes (Signed)
History of Present Illness  There is no documented history at this time  Assessments & Plan   There are no diagnoses linked to this encounter.    Additional instructions  Subjective:  Patient presents with venous ulcer of the Right lower extremity.    Procedure:  3 layer unna wrap was placed Right lower extremity.   Plan:   Follow up in one week.   

## 2018-06-21 ENCOUNTER — Ambulatory Visit (INDEPENDENT_AMBULATORY_CARE_PROVIDER_SITE_OTHER): Payer: Medicare Other | Admitting: Nurse Practitioner

## 2018-06-21 ENCOUNTER — Encounter (INDEPENDENT_AMBULATORY_CARE_PROVIDER_SITE_OTHER): Payer: Self-pay

## 2018-06-21 VITALS — BP 111/61 | HR 83 | Resp 14 | Ht 70.0 in | Wt 288.8 lb

## 2018-06-21 DIAGNOSIS — L97319 Non-pressure chronic ulcer of right ankle with unspecified severity: Secondary | ICD-10-CM

## 2018-06-21 DIAGNOSIS — I83013 Varicose veins of right lower extremity with ulcer of ankle: Secondary | ICD-10-CM

## 2018-06-21 NOTE — Progress Notes (Signed)
History of Present Illness  There is no documented history at this time  Assessments & Plan   There are no diagnoses linked to this encounter.    Additional instructions  Subjective:  Patient presents with venous ulcer of the Right lower extremity.    Procedure:  3 layer unna wrap was placed Right lower extremity.   Plan:   Follow up in one week.   

## 2018-06-24 ENCOUNTER — Other Ambulatory Visit: Payer: Self-pay | Admitting: Internal Medicine

## 2018-06-24 DIAGNOSIS — N189 Chronic kidney disease, unspecified: Secondary | ICD-10-CM

## 2018-06-28 ENCOUNTER — Ambulatory Visit
Admission: RE | Admit: 2018-06-28 | Discharge: 2018-06-28 | Disposition: A | Discharge status: Home / Self Care | Payer: Medicare Other | Source: Ambulatory Visit | Attending: Internal Medicine | Admitting: Internal Medicine

## 2018-06-28 ENCOUNTER — Ambulatory Visit (INDEPENDENT_AMBULATORY_CARE_PROVIDER_SITE_OTHER): Payer: Medicare Other | Admitting: Nurse Practitioner

## 2018-06-28 ENCOUNTER — Encounter (INDEPENDENT_AMBULATORY_CARE_PROVIDER_SITE_OTHER): Payer: Self-pay | Admitting: Nurse Practitioner

## 2018-06-28 VITALS — BP 116/62 | HR 83 | Resp 18 | Ht 70.0 in | Wt 287.6 lb

## 2018-06-28 DIAGNOSIS — L97319 Non-pressure chronic ulcer of right ankle with unspecified severity: Secondary | ICD-10-CM | POA: Diagnosis not present

## 2018-06-28 DIAGNOSIS — I129 Hypertensive chronic kidney disease with stage 1 through stage 4 chronic kidney disease, or unspecified chronic kidney disease: Secondary | ICD-10-CM | POA: Diagnosis not present

## 2018-06-28 DIAGNOSIS — I83013 Varicose veins of right lower extremity with ulcer of ankle: Secondary | ICD-10-CM

## 2018-06-28 DIAGNOSIS — N189 Chronic kidney disease, unspecified: Secondary | ICD-10-CM | POA: Insufficient documentation

## 2018-06-28 DIAGNOSIS — N183 Chronic kidney disease, stage 3 unspecified: Secondary | ICD-10-CM

## 2018-06-28 DIAGNOSIS — I890 Lymphedema, not elsewhere classified: Secondary | ICD-10-CM | POA: Diagnosis not present

## 2018-06-28 NOTE — Progress Notes (Signed)
Subjective:    Patient ID: Justin Shannon., male    DOB: [birth year], 78 y.o.   MRN: [id number] Chief Complaint  Patient presents with  . Follow-up    HPI  [facility] [name] is a 78 y.o. male   Past Medical History:  Diagnosis Date  . Arthritis   . Bilateral swelling of feet   . BPH (benign prostatic hyperplasia)   . DVT (deep venous thrombosis) (HCC)    right lower extremity  . History of gout   . Hypertension   . Tendonitis of foot    left    Past Surgical History:  Procedure Laterality Date  . APPENDECTOMY    . HERNIA REPAIR      Social History   Socioeconomic History  . Marital status: Married    Spouse name: Not on file  . Number of children: Not on file  . Years of education: Not on file  . Highest education level: Not on file  Occupational History  . Not on file  Social Needs  . Financial resource strain: Not on file  . Food insecurity:    Worry: Not on file    Inability: Not on file  . Transportation needs:    Medical: Not on file    Non-medical: Not on file  Tobacco Use  . Smoking status: Former Research scientist (life sciences)  . Smokeless tobacco: Never Used  . Tobacco comment: 25 years ago quit  Substance and Sexual Activity  . Alcohol use: No  . Drug use: No  . Sexual activity: Not on file  Lifestyle  . Physical activity:    Days per week: Not on file    Minutes per session: Not on file  . Stress: Not on file  Relationships  . Social connections:    Talks on phone: Not on file    Gets together: Not on file    Attends religious service: Not on file    Active member of club or organization: Not on file    Attends meetings of clubs or organizations: Not on file    Relationship status: Not on file  . Intimate partner violence:    Fear of current or ex partner: Not on file    Emotionally abused: Not on file    Physically abused: Not on file    Forced sexual activity: Not on file  Other Topics Concern  . Not on file  Social History Narrative  . Not on  file    Family History  Problem Relation Age of Onset  . Arthritis Mother   . Hypertension Mother     Allergies  Allergen Reactions  . Shellfish Allergy      Review of Systems   Review of Systems: Negative Unless Checked Constitutional: [] Weight loss  [] Fever  [] Chills Cardiac: [] Chest pain   []  Atrial Fibrillation  [] Palpitations   [] Shortness of breath when laying flat   [] Shortness of breath with exertion. [] Shortness of breath at rest Vascular:  [] Pain in legs with walking   [] Pain in legs with standing [] Pain in legs when laying flat   [] Claudication    [] Pain in feet when laying flat    [x] History of DVT   [] Phlebitis   [x] Swelling in legs   [] Varicose veins   [] Non-healing ulcers Pulmonary:   [] Uses home oxygen   [] Productive cough   [] Hemoptysis   [] Wheeze  [] COPD   [] Asthma Neurologic:  [] Dizziness   [] Seizures  [] Blackouts [] History of stroke   [] History of TIA  []   Aphasia   [] Temporary Blindness   [] Weakness or numbness in arm   [x] Weakness or numbness in leg Musculoskeletal:   [x] Joint swelling   [x] Joint pain   [] Low back pain  []  History of Knee Replacement [] Arthritis [] back Surgeries  []  Spinal Stenosis    Hematologic:  [] Easy bruising  [] Easy bleeding   [] Hypercoagulable state   [] Anemic Gastrointestinal:  [] Diarrhea   [] Vomiting  [] Gastroesophageal reflux/heartburn   [] Difficulty swallowing. [] Abdominal pain Genitourinary:  [] Chronic kidney disease   [] Difficult urination  [] Anuric   [] Blood in urine [] Frequent urination  [] Burning with urination   [] Hematuria Skin:  [x] Rashes   [] Ulcers [] Wounds Psychological:  [] History of anxiety   []  History of major depression  []  Memory Difficulties     Objective:   Physical Exam  BP 116/62 (BP Location: Right Arm, Patient Position: Sitting)   Pulse 83   Resp 18   Ht 5\' 10"  (1.778 m)   Wt 287 lb 9.6 oz (130.5 kg)   BMI 41.27 kg/m   Gen: WD/WN, NAD Head: [place]/AT, No temporalis wasting.  Ear/Nose/Throat: Hearing grossly  intact, nares w/o erythema or drainage Eyes: PER, EOMI, sclera nonicteric.  Neck: Supple, no masses.  No JVD.  Pulmonary:  Good air movement, no use of accessory muscles.  Cardiac: RRR Vascular: 2+ soft edema Vessel Right Left  Radial Palpable Palpable  Dorsalis Pedis Palpable Palpable  Posterior Tibial Palpable Palpable   Gastrointestinal: soft, non-distended. No guarding/no peritoneal signs.  Musculoskeletal:Uses walker for ambulation.  No deformity or atrophy.  Neurologic: Pain and light touch intact in extremities.  Symmetrical.  Speech is fluent. Motor exam as listed above. Psychiatric: Judgment intact, Mood & affect appropriate for pt's clinical situation. Dermatologic: No Venous rashes. No Ulcers Noted.  No changes consistent with cellulitis. Lymph : No Cervical lymphadenopathy, lichenification bilaterally    Assessment & Plan:   1. Lymphedema  No surgery or intervention at this point in time.    I have reviewed my discussion with the patient regarding lymphedema and why it  causes symptoms.  Patient will continue wearing graduated compression stockings class 1 (20-30 mmHg) on a daily basis a prescription was given. The patient is reminded to put the stockings on first thing in the morning and removing them in the evening. The patient is instructed specifically not to sleep in the stockings.   In addition, behavioral modification throughout the day will be continued.  This will include frequent elevation (such as in a recliner), use of over the counter pain medications as needed and exercise such as walking.  I have reviewed systemic causes for chronic edema such as liver, kidney and cardiac etiologies and there does not appear to be any significant changes in these organ systems over the past year.  The patient is under the impression that these organ systems are all stable and unchanged.    The patient will continue aggressive use of the  lymph pump.  This will continue to  improve the edema control and prevent sequela such as ulcers and infections.   The patient will follow-up in 6 months.   2. Venous ulcer of ankle, right (HCC) Previous venous stasis ulcer has healed. The patient will resume daily compression wrap therapy.  The patient was specifically instructed to place the stockings on first thing in the morning and to remove before bed.  Patient also notified that if he should have another ulceration should contact our office to be seen sooner than the scheduled appointment.  3.  Benign hypertension with chronic kidney disease, stage III (HCC) Continue antihypertensive medications as already ordered, these medications have been reviewed and there are no changes at this time.    Current Outpatient Medications on File Prior to Visit  Medication Sig Dispense Refill  . allopurinol (ZYLOPRIM) 100 MG tablet Take 100 mg by mouth daily.    Marland Kitchen aspirin 81 MG tablet Take 81 mg by mouth daily.    Marland Kitchen diltiazem (CARDIZEM LA) 240 MG 24 hr tablet Take 240 mg by mouth daily.    Marland Kitchen docusate sodium (COLACE) 100 MG capsule Take 100 mg by mouth daily.    . fexofenadine (ALLEGRA) 180 MG tablet Take 180 mg by mouth daily as needed.    . fluticasone (FLONASE) 50 MCG/ACT nasal spray Place into the nose.    . furosemide (LASIX) 40 MG tablet     . levETIRAcetam (KEPPRA) 500 MG tablet Take 1 tablet by mouth two  times daily    . losartan (COZAAR) 100 MG tablet Take 50 mg by mouth daily.     . metoprolol succinate (TOPROL-XL) 50 MG 24 hr tablet     . Multiple Vitamin (ONE-A-DAY MENS PO) Take by mouth daily.    Marland Kitchen nystatin (MYCOSTATIN/NYSTOP) powder Apply topically.    Marland Kitchen olmesartan-hydrochlorothiazide (BENICAR HCT) 40-12.5 MG tablet TAKE 1 TABLET BY MOUTH ONCE DAILY    . Rivaroxaban (XARELTO) 15 MG TABS tablet Take 20 mg by mouth.     . torsemide (DEMADEX) 20 MG tablet      No current facility-administered medications on file prior to visit.     There are no Patient Instructions  on file for this visit. No follow-ups on file.   Kris Hartmann, NP  This note was completed with Sales executive.  Any errors are purely unintentional.

## 2018-06-30 ENCOUNTER — Other Ambulatory Visit: Payer: Self-pay | Admitting: Internal Medicine

## 2018-06-30 DIAGNOSIS — N183 Chronic kidney disease, stage 3 unspecified: Secondary | ICD-10-CM

## 2018-06-30 DIAGNOSIS — I129 Hypertensive chronic kidney disease with stage 1 through stage 4 chronic kidney disease, or unspecified chronic kidney disease: Secondary | ICD-10-CM

## 2018-07-04 ENCOUNTER — Encounter (INDEPENDENT_AMBULATORY_CARE_PROVIDER_SITE_OTHER): Payer: Self-pay | Admitting: Nurse Practitioner

## 2018-07-06 ENCOUNTER — Other Ambulatory Visit: Payer: Self-pay | Admitting: Internal Medicine

## 2018-07-06 DIAGNOSIS — N183 Chronic kidney disease, stage 3 unspecified: Secondary | ICD-10-CM

## 2018-07-06 DIAGNOSIS — I129 Hypertensive chronic kidney disease with stage 1 through stage 4 chronic kidney disease, or unspecified chronic kidney disease: Secondary | ICD-10-CM

## 2018-07-07 ENCOUNTER — Ambulatory Visit (INDEPENDENT_AMBULATORY_CARE_PROVIDER_SITE_OTHER): Payer: Medicare Other | Admitting: Vascular Surgery

## 2018-07-07 ENCOUNTER — Encounter (INDEPENDENT_AMBULATORY_CARE_PROVIDER_SITE_OTHER): Payer: Self-pay | Admitting: Vascular Surgery

## 2018-07-07 VITALS — BP 129/68 | HR 86 | Resp 18 | Ht 70.0 in | Wt 289.2 lb

## 2018-07-07 DIAGNOSIS — I890 Lymphedema, not elsewhere classified: Secondary | ICD-10-CM

## 2018-07-07 DIAGNOSIS — N183 Chronic kidney disease, stage 3 unspecified: Secondary | ICD-10-CM

## 2018-07-12 ENCOUNTER — Encounter (INDEPENDENT_AMBULATORY_CARE_PROVIDER_SITE_OTHER): Payer: Self-pay | Admitting: Vascular Surgery

## 2018-07-12 NOTE — Progress Notes (Signed)
Subjective:    [name], male    DOB: Jun 17, 1941, 78 y.o.   MRN: [id number] Chief Complaint  Patient presents with  . Follow-up   Patient last seen on June 28, 2018 in our office.  At that time, the patient had completed a cycle of 3 layer zinc oxide Unna wraps to the right lower extremity for a lymphedema exacerbation with ulceration.  Patient presents today with a family member.  Patient presents today with worsening of a previous ulcer.  Patient also concerned about all of the dead skin he has right calf.  Patient notes worsening edema to the right lower extremity as well.  Patient denies any drainage from this ulceration.  Patient denies any erythema to the leg.  Patient denies any fever, nausea vomiting.  Review of Systems  Constitutional: Negative.   HENT: Negative.   Eyes: Negative.   Respiratory: Negative.   Cardiovascular: Positive for leg swelling.  Gastrointestinal: Negative.   Endocrine: Negative.   Genitourinary: Negative.   Musculoskeletal: Negative.   Skin: Positive for wound.  Allergic/Immunologic: Negative.   Neurological: Negative.   Hematological: Negative.   Psychiatric/Behavioral: Negative.       Objective:   Physical Exam Vitals signs reviewed.  Constitutional:      Appearance: Normal appearance. He is obese.  HENT:     Head: Normocephalic and atraumatic.     Right Ear: External ear normal.     Left Ear: External ear normal.     Nose: Nose normal.     Mouth/Throat:     Mouth: Mucous membranes are moist.     Pharynx: Oropharynx is clear.  Eyes:     Extraocular Movements: Extraocular movements intact.     Conjunctiva/sclera: Conjunctivae normal.     Pupils: Pupils are equal, round, and reactive to light.  Neck:     Musculoskeletal: Normal range of motion.  Cardiovascular:     Rate and Rhythm: Normal rate and regular rhythm.  Pulmonary:     Effort: Pulmonary effort is normal.     Breath sounds: Normal breath sounds.    Musculoskeletal:        General: Swelling (Mild to moderate nonpitting edema to the right lower extremity) present.  Skin:    Comments: Large area of dead skin noted to the calf.  3 cm x 3 cm shallow noninfected ulceration to the front of the calf.  Neurological:     General: No focal deficit present.     Mental Status: He is alert and oriented to person, place, and time.  Psychiatric:        Mood and Affect: Mood normal.        Behavior: Behavior normal.        Thought Content: Thought content normal.        Judgment: Judgment normal.    BP 129/68 (BP Location: Right Arm, Patient Position: Sitting)   Pulse 86   Resp 18   Ht 5\' 10"  (1.778 m)   Wt 289 lb 3.2 oz (131.2 kg)   BMI 41.50 kg/m   Past Medical History:  Diagnosis Date  . Arthritis   . Bilateral swelling of feet   . BPH (benign prostatic hyperplasia)   . DVT (deep venous thrombosis) (HCC)    right lower extremity  . History of gout   . Hypertension   . Tendonitis of foot    left   Social History   Socioeconomic History  . Marital status: Married  Spouse name: Not on file  . Number of children: Not on file  . Years of education: Not on file  . Highest education level: Not on file  Occupational History  . Not on file  Social Needs  . Financial resource strain: Not on file  . Food insecurity:    Worry: Not on file    Inability: Not on file  . Transportation needs:    Medical: Not on file    Non-medical: Not on file  Tobacco Use  . Smoking status: Former Research scientist (life sciences)  . Smokeless tobacco: Never Used  . Tobacco comment: 25 years ago quit  Substance and Sexual Activity  . Alcohol use: No  . Drug use: No  . Sexual activity: Not on file  Lifestyle  . Physical activity:    Days per week: Not on file    Minutes per session: Not on file  . Stress: Not on file  Relationships  . Social connections:    Talks on phone: Not on file    Gets together: Not on file    Attends religious service: Not on file     Active member of club or organization: Not on file    Attends meetings of clubs or organizations: Not on file    Relationship status: Not on file  . Intimate partner violence:    Fear of current or ex partner: Not on file    Emotionally abused: Not on file    Physically abused: Not on file    Forced sexual activity: Not on file  Other Topics Concern  . Not on file  Social History Narrative  . Not on file   Past Surgical History:  Procedure Laterality Date  . APPENDECTOMY    . HERNIA REPAIR     Family History  Problem Relation Age of Onset  . Arthritis Mother   . Hypertension Mother     Allergies  Allergen Reactions  . Shellfish Allergy       Assessment & Plan:  Patient last seen on June 28, 2018 in our office.  At that time, the patient had completed a cycle of 3 layer zinc oxide Unna wraps to the right lower extremity for a lymphedema exacerbation with ulceration.  Patient presents today with a family member.  Patient presents today with worsening of a previous ulcer.  Patient also concerned about all of the dead skin he has right calf.  Patient notes worsening edema to the right lower extremity as well.  Patient denies any drainage from this ulceration.  Patient denies any erythema to the leg.  Patient denies any fever, nausea vomiting.  1. Lymphedema - Stable Had a long discussion with the patient about proper hygiene in regard to his legs. Encouraged the patient to gently clean his legs with antibacterial soap to remove any dead skin that may build up. During today's visit, I gently debrided the dead skin off the patient's calf Recommend another month of right lower extremity 3 layer zinc oxide Unna wraps which were placed today in an attempt to control the patient's edema and now with the dead skin removed promote wound healing Had a long conversation about appropriate elevation as heart level or higher, multiple times a day The patient is to remain as active as  possible To undergo weekly 3 layer zinc oxide Unna wraps to the right lower extremity Patient to follow-up in 1 month  2. Chronic kidney disease (CKD), stage III (moderate) (HCC) - Stable Contributing factor to the  patient's lower extremity edema and intermittent lymphedema exacerbation  Current Outpatient Medications on File Prior to Visit  Medication Sig Dispense Refill  . allopurinol (ZYLOPRIM) 100 MG tablet Take 100 mg by mouth daily.    Marland Kitchen aspirin 81 MG tablet Take 81 mg by mouth daily.    Marland Kitchen diltiazem (CARDIZEM LA) 240 MG 24 hr tablet Take 240 mg by mouth daily.    Marland Kitchen docusate sodium (COLACE) 100 MG capsule Take 100 mg by mouth daily.    . fexofenadine (ALLEGRA) 180 MG tablet Take 180 mg by mouth daily as needed.    . fluticasone (FLONASE) 50 MCG/ACT nasal spray Place into the nose.    . furosemide (LASIX) 40 MG tablet     . levETIRAcetam (KEPPRA) 500 MG tablet Take 1 tablet by mouth two  times daily    . losartan (COZAAR) 100 MG tablet Take 50 mg by mouth daily.     . metoprolol succinate (TOPROL-XL) 50 MG 24 hr tablet     . Multiple Vitamin (ONE-A-DAY MENS PO) Take by mouth daily.    Marland Kitchen nystatin (MYCOSTATIN/NYSTOP) powder Apply topically.    Marland Kitchen olmesartan-hydrochlorothiazide (BENICAR HCT) 40-12.5 MG tablet TAKE 1 TABLET BY MOUTH ONCE DAILY    . Rivaroxaban (XARELTO) 15 MG TABS tablet Take 20 mg by mouth.     . torsemide (DEMADEX) 20 MG tablet      No current facility-administered medications on file prior to visit.    There are no Patient Instructions on file for this visit. No follow-ups on file.  Rhona Fusilier A Arno Cullers, PA-C

## 2018-07-14 ENCOUNTER — Ambulatory Visit (INDEPENDENT_AMBULATORY_CARE_PROVIDER_SITE_OTHER): Payer: Medicare Other | Admitting: Nurse Practitioner

## 2018-07-14 ENCOUNTER — Encounter (INDEPENDENT_AMBULATORY_CARE_PROVIDER_SITE_OTHER): Payer: Self-pay

## 2018-07-14 VITALS — BP 125/62 | HR 83 | Resp 14 | Ht 70.0 in | Wt 291.6 lb

## 2018-07-14 DIAGNOSIS — L97319 Non-pressure chronic ulcer of right ankle with unspecified severity: Secondary | ICD-10-CM | POA: Diagnosis not present

## 2018-07-14 DIAGNOSIS — I83013 Varicose veins of right lower extremity with ulcer of ankle: Secondary | ICD-10-CM | POA: Diagnosis not present

## 2018-07-14 NOTE — Progress Notes (Signed)
History of Present Illness  There is no documented history at this time  Assessments & Plan   There are no diagnoses linked to this encounter.    Additional instructions  Subjective:  Patient presents with venous ulcer of the Right lower extremity.    Procedure:  3 layer unna wrap was placed Right lower extremity.   Plan:   Follow up in one week.   

## 2018-07-21 ENCOUNTER — Ambulatory Visit (INDEPENDENT_AMBULATORY_CARE_PROVIDER_SITE_OTHER): Payer: Medicare Other | Admitting: Nurse Practitioner

## 2018-07-21 ENCOUNTER — Encounter (INDEPENDENT_AMBULATORY_CARE_PROVIDER_SITE_OTHER): Payer: Self-pay

## 2018-07-21 VITALS — BP 129/73 | HR 73 | Resp 18 | Ht 70.0 in | Wt 290.4 lb

## 2018-07-21 DIAGNOSIS — L97319 Non-pressure chronic ulcer of right ankle with unspecified severity: Secondary | ICD-10-CM | POA: Diagnosis not present

## 2018-07-21 DIAGNOSIS — I83013 Varicose veins of right lower extremity with ulcer of ankle: Secondary | ICD-10-CM

## 2018-07-21 NOTE — Progress Notes (Signed)
History of Present Illness  There is no documented history at this time  Assessments & Plan   There are no diagnoses linked to this encounter.    Additional instructions  Subjective:  Patient presents with venous ulcer of the Right lower extremity.    Procedure:  3 layer unna wrap was placed Right lower extremity.   Plan:   Follow up in one week.   

## 2018-07-28 ENCOUNTER — Ambulatory Visit (INDEPENDENT_AMBULATORY_CARE_PROVIDER_SITE_OTHER): Payer: Medicare Other | Admitting: Nurse Practitioner

## 2018-07-28 ENCOUNTER — Encounter (INDEPENDENT_AMBULATORY_CARE_PROVIDER_SITE_OTHER): Payer: Self-pay | Admitting: Nurse Practitioner

## 2018-07-28 VITALS — BP 131/71 | HR 76 | Resp 14 | Ht 70.0 in | Wt 293.0 lb

## 2018-07-28 DIAGNOSIS — I129 Hypertensive chronic kidney disease with stage 1 through stage 4 chronic kidney disease, or unspecified chronic kidney disease: Secondary | ICD-10-CM | POA: Diagnosis not present

## 2018-07-28 DIAGNOSIS — N183 Chronic kidney disease, stage 3 unspecified: Secondary | ICD-10-CM

## 2018-07-28 DIAGNOSIS — I890 Lymphedema, not elsewhere classified: Secondary | ICD-10-CM | POA: Diagnosis not present

## 2018-07-28 DIAGNOSIS — L97319 Non-pressure chronic ulcer of right ankle with unspecified severity: Secondary | ICD-10-CM

## 2018-07-28 DIAGNOSIS — Z87891 Personal history of nicotine dependence: Secondary | ICD-10-CM

## 2018-07-28 DIAGNOSIS — I83013 Varicose veins of right lower extremity with ulcer of ankle: Secondary | ICD-10-CM

## 2018-07-28 NOTE — Progress Notes (Signed)
Subjective:    Patient ID: Justin Shannon., male    DOB: 03/29/1941, 78 y.o.   MRN: [id number] Chief Complaint  Patient presents with  . Follow-up    [name] CHECK    HPI  [name]. is a 78 y.o. male that presents today for week Unna wrap evaluation.  He previously had an ulcer on his right lower extremity, lateral portion.  Today, there is no ulceration present.  The patient denies any pain that he usually feels when he is ulcerations.  He states that infections legs feel very good.  His swelling is very under control today.  He endorses using his lymphedema pump daily.  He denies any fever, chills, nausea, vomiting or diarrhea.  He denies any chest pain or shortness of breath.   Past Medical History:  Diagnosis Date  . Arthritis   . Bilateral swelling of feet   . BPH (benign prostatic hyperplasia)   . DVT (deep venous thrombosis) (HCC)    right lower extremity  . History of gout   . Hypertension   . Tendonitis of foot    left    Past Surgical History:  Procedure Laterality Date  . APPENDECTOMY    . HERNIA REPAIR      Social History   Socioeconomic History  . Marital status: Married    Spouse name: Not on file  . Number of children: Not on file  . Years of education: Not on file  . Highest education level: Not on file  Occupational History  . Not on file  Social Needs  . Financial resource strain: Not on file  . Food insecurity:    Worry: Not on file    Inability: Not on file  . Transportation needs:    Medical: Not on file    Non-medical: Not on file  Tobacco Use  . Smoking status: Former Research scientist (life sciences)  . Smokeless tobacco: Never Used  . Tobacco comment: 25 years ago quit  Substance and Sexual Activity  . Alcohol use: No  . Drug use: No  . Sexual activity: Not on file  Lifestyle  . Physical activity:    Days per week: Not on file    Minutes per session: Not on file  . Stress: Not on file  Relationships  . Social connections:    Talks  on phone: Not on file    Gets together: Not on file    Attends religious service: Not on file    Active member of club or organization: Not on file    Attends meetings of clubs or organizations: Not on file    Relationship status: Not on file  . Intimate partner violence:    Fear of current or ex partner: Not on file    Emotionally abused: Not on file    Physically abused: Not on file    Forced sexual activity: Not on file  Other Topics Concern  . Not on file  Social History Narrative  . Not on file    Family History  Problem Relation Age of Onset  . Arthritis Mother   . Hypertension Mother     Allergies  Allergen Reactions  . Shellfish Allergy      Review of Systems   Review of Systems: Negative Unless Checked Constitutional: [] Weight loss  [] Fever  [] Chills Cardiac: [] Chest pain   []  Atrial Fibrillation  [] Palpitations   [] Shortness of breath when laying flat   [] Shortness of breath with exertion. [] Shortness  of breath at rest Vascular:  [] Pain in legs with walking   [] Pain in legs with standing [] Pain in legs when laying flat   [] Claudication    [] Pain in feet when laying flat    [x] History of DVT   [] Phlebitis   [] Swelling in legs   [] Varicose veins   [] Non-healing ulcers Pulmonary:   [] Uses home oxygen   [] Productive cough   [] Hemoptysis   [] Wheeze  [] COPD   [] Asthma Neurologic:  [] Dizziness   [] Seizures  [] Blackouts [] History of stroke   [] History of TIA  [] Aphasia   [] Temporary Blindness   [] Weakness or numbness in arm   [x] Weakness or numbness in leg Musculoskeletal:   [x] Joint swelling   [x] Joint pain   [] Low back pain  []  History of Knee Replacement [] Arthritis [] back Surgeries  []  Spinal Stenosis    Hematologic:  [] Easy bruising  [] Easy bleeding   [] Hypercoagulable state   [x] Anemic Gastrointestinal:  [] Diarrhea   [] Vomiting  [] Gastroesophageal reflux/heartburn   [] Difficulty swallowing. [] Abdominal pain Genitourinary:  [x] Chronic kidney disease   [] Difficult  urination  [] Anuric   [] Blood in urine [] Frequent urination  [] Burning with urination   [] Hematuria Skin:  [] Rashes   [] Ulcers [] Wounds Psychological:  [] History of anxiety   []  History of major depression  []  Memory Difficulties     Objective:   Physical Exam  BP 131/71 (BP Location: Right Arm, Patient Position: Sitting)   Pulse 76   Resp 14   Ht 5\' 10"  (1.778 m)   Wt 293 lb (132.9 kg)   BMI 42.04 kg/m   Gen: WD/WN, NAD Head: [place]/AT, No temporalis wasting.  Ear/Nose/Throat: Hearing grossly intact, nares w/o erythema or drainage Eyes: PER, EOMI, sclera nonicteric.  Neck: Supple, no masses.  No JVD.  Pulmonary:  Good air movement, no use of accessory muscles.  Cardiac: RRR Vascular:  1+ edema right lower extremity Vessel Right Left  Radial Palpable Palpable  Dorsalis Pedis Palpable Palpable  Posterior Tibial Palpable Palpable   Gastrointestinal: soft, non-distended. No guarding/no peritoneal signs.  Musculoskeletal: Uses cane for ambulation.  No deformity or atrophy.  Neurologic: Pain and light touch intact in extremities.  Symmetrical.  Speech is fluent. Motor exam as listed above. Psychiatric: Judgment intact, Mood & affect appropriate for pt's clinical situation. Dermatologic: No Venous rashes. No Ulcers Noted.  No changes consistent with cellulitis. Lymph : No Cervical lymphadenopathy, no lichenification or skin changes of chronic lymphedema.      Assessment & Plan:   1. Lymphedema  No surgery or intervention at this point in time.    I have reviewed my discussion with the patient regarding lymphedema and why it  causes symptoms.  Patient will continue wearing graduated compression stockings class 1 (20-30 mmHg) on a daily basis a prescription was given. The patient is reminded to put the stockings on first thing in the morning and removing them in the evening. The patient is instructed specifically not to sleep in the stockings.   In addition, behavioral modification  throughout the day will be continued.  This will include frequent elevation (such as in a recliner), use of over the counter pain medications as needed and exercise such as walking.  I have reviewed systemic causes for chronic edema such as liver, kidney and cardiac etiologies and there does not appear to be any significant changes in these organ systems over the past year.  The patient is under the impression that these organ systems are all stable and unchanged.  The patient will continue aggressive use of the  lymph pump.  This will continue to improve the edema control and prevent sequela such as ulcers and infections.   The patient will follow-up with me on an annual basis.    2. Venous ulcer of ankle, right ([place]) Current ulcer has resolved no need for Unna wraps at this time  3. Benign hypertension with chronic kidney disease, stage III (HCC) Continue antihypertensive medications as already ordered, these medications have been reviewed and there are no changes at this time.    Current Outpatient Medications on File Prior to Visit  Medication Sig Dispense Refill  . allopurinol (ZYLOPRIM) 100 MG tablet Take 100 mg by mouth daily.    Marland Kitchen aspirin 81 MG tablet Take 81 mg by mouth daily.    Marland Kitchen diltiazem (CARDIZEM LA) 240 MG 24 hr tablet Take 240 mg by mouth daily.    Marland Kitchen docusate sodium (COLACE) 100 MG capsule Take 100 mg by mouth daily.    . fexofenadine (ALLEGRA) 180 MG tablet Take 180 mg by mouth daily as needed.    . fluticasone (FLONASE) 50 MCG/ACT nasal spray Place into the nose.    . furosemide (LASIX) 40 MG tablet     . losartan (COZAAR) 100 MG tablet Take 50 mg by mouth daily.     . metoprolol succinate (TOPROL-XL) 50 MG 24 hr tablet     . Multiple Vitamin (ONE-A-DAY MENS PO) Take by mouth daily.    Marland Kitchen nystatin (MYCOSTATIN/NYSTOP) powder Apply topically.    Marland Kitchen olmesartan-hydrochlorothiazide (BENICAR HCT) 40-12.5 MG tablet TAKE 1 TABLET BY MOUTH ONCE DAILY    . Rivaroxaban (XARELTO)  15 MG TABS tablet Take 20 mg by mouth.     . torsemide (DEMADEX) 20 MG tablet      No current facility-administered medications on file prior to visit.     There are no Patient Instructions on file for this visit. No follow-ups on file.   Justin Hartmann, NP  This note was completed with Sales executive.  Any errors are purely unintentional.

## 2018-08-05 ENCOUNTER — Ambulatory Visit: Payer: Medicare Other | Admitting: Podiatry

## 2018-08-05 ENCOUNTER — Encounter: Payer: Self-pay | Admitting: Podiatry

## 2018-08-05 DIAGNOSIS — D689 Coagulation defect, unspecified: Secondary | ICD-10-CM | POA: Diagnosis not present

## 2018-08-05 DIAGNOSIS — M722 Plantar fascial fibromatosis: Secondary | ICD-10-CM | POA: Diagnosis not present

## 2018-08-05 DIAGNOSIS — B351 Tinea unguium: Secondary | ICD-10-CM | POA: Diagnosis not present

## 2018-08-05 DIAGNOSIS — M79676 Pain in unspecified toe(s): Secondary | ICD-10-CM

## 2018-08-05 DIAGNOSIS — M79609 Pain in unspecified limb: Principal | ICD-10-CM

## 2018-08-05 MED ORDER — TRAMADOL HCL 50 MG PO TABS: 50.0000 mg | ORAL_TABLET | Freq: Three times a day (TID) | ORAL | 0 refills | Status: DC | PRN

## 2018-08-05 MED ORDER — TRAMADOL HCL 50 MG PO TABS: 50.0000 mg | ORAL_TABLET | Freq: Four times a day (QID) | ORAL | 0 refills | Status: DC | PRN

## 2018-08-05 NOTE — Progress Notes (Signed)
Complaint:  Visit Type: Patient returns to my office for continued preventative foot care services. Complaint: Patient states" my nails have grown long and thick and become painful to walk and wear shoes"  The patient presents for preventative foot care services. No changes to ROS.  Patient is taking xarelto.  Patient states that he injured the arch of his left foot 3 days ago and the pain has continued.  He says that there is pain walking and wearing his shoes.  He presents the office today for an evaluation of this left foot while he presents for preventative foot care services.  Podiatric Exam: Vascular: dorsalis pedis and posterior tibial pulses are palpable bilateral. Capillary return is immediate. Temperature gradient is WNL. Skin turgor WNL  Sensorium: Normal Semmes Weinstein monofilament test. Normal tactile sensation bilaterally. Nail Exam: Pt has thick disfigured discolored nails with subungual debris noted bilateral entire nail hallux through fifth toenails.  Subungual ulceration due to fungus toenail. Ulcer Exam: There is no evidence of ulcer or pre-ulcerative changes or infection. Orthopedic Exam: Muscle tone and strength are WNL. No limitations in general ROM. No crepitus or effusions noted. Foot type and digits show no abnormalities. Bony prominences are unremarkable.  Palpable pain noted through the arch of the left foot. Skin: No Porokeratosis. No infection or ulcers  Diagnosis:  Onychomycosis, , Pain in right toe, pain in left toes, Plantar fasciitis left foot.  Treatment & Plan Procedures and Treatment: Consent by patient was obtained for treatment procedures.   Debridement of mycotic and hypertrophic toenails, 1 through 5 bilateral and clearing of subungual debris. no infection noted.   Told patient he needs to walk with good arch supported shoes for the next few weeks and prescribed tramadol for pain.  Return Visit-Office Procedure: Patient instructed to return to the office for  a follow up visit 3 months for continued evaluation and treatment.    Gardiner Barefoot DPM

## 2018-08-17 ENCOUNTER — Telehealth (INDEPENDENT_AMBULATORY_CARE_PROVIDER_SITE_OTHER): Payer: Self-pay | Admitting: Vascular Surgery

## 2018-08-17 ENCOUNTER — Other Ambulatory Visit (INDEPENDENT_AMBULATORY_CARE_PROVIDER_SITE_OTHER): Payer: Self-pay | Admitting: Nurse Practitioner

## 2018-08-17 NOTE — Telephone Encounter (Signed)
Per Arna Medici patient need to be schedule for tomorrow to see provider

## 2018-08-18 ENCOUNTER — Other Ambulatory Visit: Payer: Self-pay

## 2018-08-18 ENCOUNTER — Ambulatory Visit (INDEPENDENT_AMBULATORY_CARE_PROVIDER_SITE_OTHER): Payer: Medicare Other | Admitting: Nurse Practitioner

## 2018-08-18 ENCOUNTER — Encounter (INDEPENDENT_AMBULATORY_CARE_PROVIDER_SITE_OTHER): Payer: Self-pay | Admitting: Nurse Practitioner

## 2018-08-18 VITALS — BP 139/78 | HR 71 | Resp 14 | Ht 70.0 in | Wt 289.0 lb

## 2018-08-18 DIAGNOSIS — I890 Lymphedema, not elsewhere classified: Secondary | ICD-10-CM

## 2018-08-18 DIAGNOSIS — L97319 Non-pressure chronic ulcer of right ankle with unspecified severity: Secondary | ICD-10-CM

## 2018-08-18 DIAGNOSIS — Z87891 Personal history of nicotine dependence: Secondary | ICD-10-CM

## 2018-08-18 DIAGNOSIS — I129 Hypertensive chronic kidney disease with stage 1 through stage 4 chronic kidney disease, or unspecified chronic kidney disease: Secondary | ICD-10-CM

## 2018-08-18 DIAGNOSIS — I83013 Varicose veins of right lower extremity with ulcer of ankle: Secondary | ICD-10-CM

## 2018-08-18 DIAGNOSIS — N183 Chronic kidney disease, stage 3 unspecified: Secondary | ICD-10-CM

## 2018-08-18 NOTE — Progress Notes (Signed)
SUBJECTIVE:  Patient ID: Justin Shannon., male    DOB: 1941-04-05, 78 y.o.   MRN: [id number] Chief Complaint  Patient presents with  . Follow-up    R leg draining    HPI  [facility]. is a 78 y.o. male Patient is seen for evaluation of leg pain and swelling associated with new onset ulceration. The patient first noticed the swelling remotely. The swelling is associated with pain and discoloration. The pain and swelling worsens with prolonged dependency and improves with elevation. The pain is unrelated to activity.  The patient notes that in the morning the legs are better but the leg symptoms worsened throughout the course of the day. The patient has also noted a progressive worsening of the discoloration in the ankle and shin area.   The patient notes that an ulcer has developed acutely without specific trauma and since it occurred it has been very slow to heal.  There is a moderate amount of drainage associated with the open area.  The wound is also very painful.  The patient denies claudication symptoms or rest pain symptoms.  The patient denies DJD and LS spine disease.  The patient has not had any past angiography, interventions or vascular surgery.  Elevation makes the leg symptoms better, dependency makes them much worse. The patient denies any recent changes in medications.  The patient has not been wearing graduated compression.  The patient denies a history of DVT or PE. There is no prior history of phlebitis. There is no history of primary lymphedema.  No history of malignancies. No history of trauma or groin or pelvic surgery. There is no history of radiation treatment to the groin or pelvis       Past Medical History:  Diagnosis Date  . Arthritis   . Bilateral swelling of feet   . BPH (benign prostatic hyperplasia)   . DVT (deep venous thrombosis) (HCC)    right lower extremity  . History of gout   . Hypertension   . Tendonitis of foot    left     Past Surgical History:  Procedure Laterality Date  . APPENDECTOMY    . HERNIA REPAIR      Social History   Socioeconomic History  . Marital status: Married    Spouse name: Not on file  . Number of children: Not on file  . Years of education: Not on file  . Highest education level: Not on file  Occupational History  . Not on file  Social Needs  . Financial resource strain: Not on file  . Food insecurity:    Worry: Not on file    Inability: Not on file  . Transportation needs:    Medical: Not on file    Non-medical: Not on file  Tobacco Use  . Smoking status: Former Research scientist (life sciences)  . Smokeless tobacco: Never Used  . Tobacco comment: 25 years ago quit  Substance and Sexual Activity  . Alcohol use: No  . Drug use: No  . Sexual activity: Not on file  Lifestyle  . Physical activity:    Days per week: Not on file    Minutes per session: Not on file  . Stress: Not on file  Relationships  . Social connections:    Talks on phone: Not on file    Gets together: Not on file    Attends religious service: Not on file    Active member of club or organization: Not on file    Attends  meetings of clubs or organizations: Not on file    Relationship status: Not on file  . Intimate partner violence:    Fear of current or ex partner: Not on file    Emotionally abused: Not on file    Physically abused: Not on file    Forced sexual activity: Not on file  Other Topics Concern  . Not on file  Social History Narrative  . Not on file    Family History  Problem Relation Age of Onset  . Arthritis Mother   . Hypertension Mother     Allergies  Allergen Reactions  . Shellfish Allergy      Review of Systems   Review of Systems: Negative Unless Checked Constitutional: [] Weight loss  [] Fever  [] Chills Cardiac: [] Chest pain   []  Atrial Fibrillation  [] Palpitations   [] Shortness of breath when laying flat   [] Shortness of breath with exertion. [] Shortness of breath at rest Vascular:   [] Pain in legs with walking   [] Pain in legs with standing [] Pain in legs when laying flat   [] Claudication    [] Pain in feet when laying flat    [] History of DVT   [] Phlebitis   [x] Swelling in legs   [] Varicose veins   [x] Non-healing ulcers Pulmonary:   [] Uses home oxygen   [] Productive cough   [] Hemoptysis   [] Wheeze  [] COPD   [] Asthma Neurologic:  [] Dizziness   [] Seizures  [] Blackouts [] History of stroke   [] History of TIA  [] Aphasia   [] Temporary Blindness   [] Weakness or numbness in arm   [x] Weakness or numbness in leg Musculoskeletal:   [] Joint swelling   [] Joint pain   [] Low back pain  []  History of Knee Replacement [] Arthritis [] back Surgeries  []  Spinal Stenosis    Hematologic:  [] Easy bruising  [] Easy bleeding   [] Hypercoagulable state   [] Anemic Gastrointestinal:  [] Diarrhea   [] Vomiting  [] Gastroesophageal reflux/heartburn   [] Difficulty swallowing. [] Abdominal pain Genitourinary:  [] Chronic kidney disease   [] Difficult urination  [] Anuric   [] Blood in urine [] Frequent urination  [] Burning with urination   [] Hematuria Skin:  [] Rashes   [x] Ulcers [] Wounds Psychological:  [] History of anxiety   []  History of major depression  []  Memory Difficulties      OBJECTIVE:   Physical Exam  BP 139/78 (BP Location: Left Arm, Patient Position: Sitting, Cuff Size: Large)   Pulse 71   Resp 14   Ht 5\' 10"  (1.778 m)   Wt 289 lb (131.1 kg)   BMI 41.47 kg/m   Gen: WD/WN, NAD Head: [place]/AT, No temporalis wasting.  Ear/Nose/Throat: Hearing grossly intact, nares w/o erythema or drainage Eyes: PER, EOMI, sclera nonicteric.  Neck: Supple, no masses.  No JVD.  Pulmonary:  Good air movement, no use of accessory muscles.  Cardiac: RRR Vascular:  Vessel Right Left  Radial Palpable Palpable  Dorsalis Pedis Palpable Palpable  Posterior Tibial Palpable Palpable   Gastrointestinal: soft, non-distended. No guarding/no peritoneal signs.  Musculoskeletal: M/S 5/5 throughout.  No deformity or atrophy.    Neurologic: Pain and light touch intact in extremities.  Symmetrical.  Speech is fluent. Motor exam as listed above. Psychiatric: Judgment intact, Mood & affect appropriate for pt's clinical situation. Dermatologic:  Small ulceration near anterior ankle area. No changes consistent with cellulitis. Lymph : No Cervical lymphadenopathy, no lichenification or skin changes of chronic lymphedema.       ASSESSMENT AND PLAN:  1. Lymphedema I have had a long discussion with the patient regarding swelling and why it  causes symptoms.  Patient will begin wearing graduated compression stockings class 1 (20-30 mmHg) on a daily basis a prescription was given. The patient will  beginning wearing the stockings first thing in the morning and removing them in the evening. The patient is instructed specifically not to sleep in the stockings.   In addition, behavioral modification will be initiated.  This will include frequent elevation, use of over the counter pain medications and exercise such as walking.  I have reviewed systemic causes for chronic edema such as liver, kidney and cardiac etiologies.  The patient denies problems with these organ systems.    Consideration for a lymph pump will also be made based upon the effectiveness of conservative therapy.  This would help to improve the edema control and prevent sequela such as ulcers and infections   Patient should undergo duplex ultrasound of the venous system to ensure that DVT or reflux is not present.  The patient will follow-up with me after the ultrasound.    2. Venous ulcer of ankle, right (HCC) No surgery or intervention at this point in time.    I have had a long discussion with the patient regarding venous insufficiency and why it  causes symptoms, specifically venous ulceration . I have discussed with the patient the chronic skin changes that accompany venous insufficiency and the long term sequela such as infection and recurring   ulceration.  Patient will be placed in [employer] which will be changed weekly drainage permitting.  In addition, behavioral modification including several periods of elevation of the lower extremities during the day will be continued. Achieving a position with the ankles at heart level was stressed to the patient  The patient is instructed to begin routine exercise, especially walking on a daily basis  Patient should undergo duplex ultrasound of the venous system to ensure that DVT or reflux is not present.  We will have the patient present weekly for Unna wraps and will reassess wound in 4 weeks.   The patient can be assessed for graduated compression stockings or wraps as well as a Lymph Pump once the ulcers are healed.  - VAS [place] LOWER EXTREMITY VENOUS REFLUX; Future  3. Benign hypertension with chronic kidney disease, stage III (HCC) Continue antihypertensive medications as already ordered, these medications have been reviewed and there are no changes at this time.    Current Outpatient Medications on File Prior to Visit  Medication Sig Dispense Refill  . allopurinol (ZYLOPRIM) 100 MG tablet Take 100 mg by mouth daily.    Marland Kitchen diltiazem (CARDIZEM LA) 240 MG 24 hr tablet Take 240 mg by mouth daily.    Marland Kitchen docusate sodium (COLACE) 100 MG capsule Take 100 mg by mouth daily.    . fexofenadine (ALLEGRA) 180 MG tablet Take 180 mg by mouth daily as needed.    . fluticasone (FLONASE) 50 MCG/ACT nasal spray Place into the nose.    . furosemide (LASIX) 40 MG tablet     . metoprolol succinate (TOPROL-XL) 50 MG 24 hr tablet     . Multiple Vitamin (ONE-A-DAY MENS PO) Take by mouth daily.    Marland Kitchen nystatin (MYCOSTATIN/NYSTOP) powder Apply topically.    Marland Kitchen olmesartan-hydrochlorothiazide (BENICAR HCT) 40-12.5 MG tablet TAKE 1 TABLET BY MOUTH ONCE DAILY    . Rivaroxaban (XARELTO) 15 MG TABS tablet Take 20 mg by mouth.     . torsemide (DEMADEX) 20 MG tablet     . traMADol (ULTRAM) 50 MG tablet Take 1 tablet  (  50 mg total) by mouth every 6 (six) hours as needed. 30 tablet 0  . aspirin 81 MG tablet Take 81 mg by mouth daily.    Marland Kitchen losartan (COZAAR) 100 MG tablet Take 50 mg by mouth daily.      No current facility-administered medications on file prior to visit.     There are no Patient Instructions on file for this visit. No follow-ups on file.   Kris Hartmann, NP  This note was completed with Sales executive.  Any errors are purely unintentional.

## 2018-08-25 ENCOUNTER — Ambulatory Visit (INDEPENDENT_AMBULATORY_CARE_PROVIDER_SITE_OTHER): Payer: Medicare Other | Admitting: Nurse Practitioner

## 2018-08-25 ENCOUNTER — Encounter (INDEPENDENT_AMBULATORY_CARE_PROVIDER_SITE_OTHER): Payer: Self-pay

## 2018-08-25 VITALS — BP 171/93 | HR 77 | Resp 10 | Ht 70.0 in | Wt 292.0 lb

## 2018-08-25 DIAGNOSIS — I83013 Varicose veins of right lower extremity with ulcer of ankle: Secondary | ICD-10-CM

## 2018-08-25 DIAGNOSIS — L97319 Non-pressure chronic ulcer of right ankle with unspecified severity: Secondary | ICD-10-CM | POA: Diagnosis not present

## 2018-08-25 NOTE — Progress Notes (Signed)
History of Present Illness  There is no documented history at this time  Assessments & Plan   There are no diagnoses linked to this encounter.    Additional instructions  Subjective:  Patient presents with venous ulcer of the Right lower extremity.    Procedure:  3 layer unna wrap was placed Right lower extremity.   Plan:   Follow up in one week.   

## 2018-09-01 ENCOUNTER — Encounter (INDEPENDENT_AMBULATORY_CARE_PROVIDER_SITE_OTHER): Payer: Self-pay

## 2018-09-01 ENCOUNTER — Ambulatory Visit (INDEPENDENT_AMBULATORY_CARE_PROVIDER_SITE_OTHER): Payer: Medicare Other | Admitting: Nurse Practitioner

## 2018-09-01 ENCOUNTER — Other Ambulatory Visit: Payer: Self-pay

## 2018-09-01 VITALS — BP 129/69 | HR 70 | Resp 12 | Ht 70.0 in | Wt 291.0 lb

## 2018-09-01 DIAGNOSIS — I83013 Varicose veins of right lower extremity with ulcer of ankle: Secondary | ICD-10-CM | POA: Diagnosis not present

## 2018-09-01 DIAGNOSIS — L97319 Non-pressure chronic ulcer of right ankle with unspecified severity: Secondary | ICD-10-CM

## 2018-09-01 NOTE — Progress Notes (Signed)
History of Present Illness  There is no documented history at this time  Assessments & Plan   There are no diagnoses linked to this encounter.    Additional instructions  Subjective:  Patient presents with venous ulcer of the Right lower extremity.    Procedure:  3 layer unna wrap was placed Right lower extremity.   Plan:   Follow up in one week.   

## 2018-09-08 ENCOUNTER — Other Ambulatory Visit: Payer: Self-pay

## 2018-09-08 ENCOUNTER — Ambulatory Visit (INDEPENDENT_AMBULATORY_CARE_PROVIDER_SITE_OTHER): Payer: Medicare Other

## 2018-09-08 ENCOUNTER — Ambulatory Visit (INDEPENDENT_AMBULATORY_CARE_PROVIDER_SITE_OTHER): Payer: Medicare Other | Admitting: Nurse Practitioner

## 2018-09-08 ENCOUNTER — Encounter (INDEPENDENT_AMBULATORY_CARE_PROVIDER_SITE_OTHER): Payer: Self-pay | Admitting: Nurse Practitioner

## 2018-09-08 VITALS — BP 173/95 | HR 64 | Resp 16 | Ht 70.0 in | Wt 296.0 lb

## 2018-09-08 DIAGNOSIS — I83013 Varicose veins of right lower extremity with ulcer of ankle: Secondary | ICD-10-CM | POA: Diagnosis not present

## 2018-09-08 DIAGNOSIS — Z87891 Personal history of nicotine dependence: Secondary | ICD-10-CM

## 2018-09-08 DIAGNOSIS — I890 Lymphedema, not elsewhere classified: Secondary | ICD-10-CM

## 2018-09-08 DIAGNOSIS — L97319 Non-pressure chronic ulcer of right ankle with unspecified severity: Secondary | ICD-10-CM

## 2018-09-08 DIAGNOSIS — Z79899 Other long term (current) drug therapy: Secondary | ICD-10-CM

## 2018-09-08 DIAGNOSIS — I129 Hypertensive chronic kidney disease with stage 1 through stage 4 chronic kidney disease, or unspecified chronic kidney disease: Secondary | ICD-10-CM

## 2018-09-08 DIAGNOSIS — N183 Chronic kidney disease, stage 3 unspecified: Secondary | ICD-10-CM

## 2018-09-15 ENCOUNTER — Ambulatory Visit (INDEPENDENT_AMBULATORY_CARE_PROVIDER_SITE_OTHER): Payer: Medicare Other | Admitting: Nurse Practitioner

## 2018-09-15 ENCOUNTER — Other Ambulatory Visit: Payer: Self-pay

## 2018-09-15 ENCOUNTER — Telehealth (INDEPENDENT_AMBULATORY_CARE_PROVIDER_SITE_OTHER): Payer: Self-pay | Admitting: Nurse Practitioner

## 2018-09-15 ENCOUNTER — Encounter (INDEPENDENT_AMBULATORY_CARE_PROVIDER_SITE_OTHER): Payer: Self-pay

## 2018-09-15 VITALS — BP 155/87 | HR 67 | Resp 10 | Ht 70.0 in | Wt 290.0 lb

## 2018-09-15 DIAGNOSIS — I83013 Varicose veins of right lower extremity with ulcer of ankle: Secondary | ICD-10-CM | POA: Diagnosis not present

## 2018-09-15 DIAGNOSIS — L97319 Non-pressure chronic ulcer of right ankle with unspecified severity: Secondary | ICD-10-CM | POA: Diagnosis not present

## 2018-09-15 NOTE — Progress Notes (Signed)
History of Present Illness  There is no documented history at this time  Assessments & Plan   There are no diagnoses linked to this encounter.    Additional instructions  Subjective:  Patient presents with venous ulcer of the Right lower extremity.    Procedure:  3 layer unna wrap was placed Right lower extremity.   Plan:   Follow up in one week.   

## 2018-09-15 NOTE — Telephone Encounter (Signed)
Patient requesting home health to help with Unna wraps, compression stocking, compression sleeves and help with showering. He does not want to come into the office unless necessary due to [place]. Also, wife just had an operation and she is unable to assist him.   Please advise. AS, CMA

## 2018-09-15 NOTE — Telephone Encounter (Signed)
That is fine. Mickel Baas, could you please help to facilitate? Thanks.

## 2018-09-16 ENCOUNTER — Encounter (INDEPENDENT_AMBULATORY_CARE_PROVIDER_SITE_OTHER): Payer: Self-pay | Admitting: Nurse Practitioner

## 2018-09-16 NOTE — Telephone Encounter (Signed)
Homehealth has been ordered for the patient. [facility] will be going out to do unna boot changes weekly.

## 2018-09-16 NOTE — Progress Notes (Signed)
SUBJECTIVE:  Patient ID: Justin Shannon., male    DOB: [birth year], 78 y.o.   MRN: [id number] Chief Complaint  Patient presents with  . Follow-up    unna/ultrasound follow up    HPI  [facility]. is a 78 y.o. male that presents today for assessment of lower extremities following Unna wrap therapy.  The patient continues to have a small ulceration on the right anterior portion of his lower extremity.  He continues to elevate his legs as much as possible.  He attempts to use his lymphedema pump however is little bit difficult due to the fact that his wife generally helps him however she has recently had surgery.  Otherwise he denies any issues with the interacts.  Patient denies any fever, chills, nausea, vomiting or diarrhea.  Denies any chest pain or shortness of breath.  Past Medical History:  Diagnosis Date  . Arthritis   . Bilateral swelling of feet   . BPH (benign prostatic hyperplasia)   . DVT (deep venous thrombosis) (HCC)    right lower extremity  . History of gout   . Hypertension   . Tendonitis of foot    left    Past Surgical History:  Procedure Laterality Date  . APPENDECTOMY    . HERNIA REPAIR      Social History   Socioeconomic History  . Marital status: Married    Spouse name: Not on file  . Number of children: Not on file  . Years of education: Not on file  . Highest education level: Not on file  Occupational History  . Not on file  Social Needs  . Financial resource strain: Not on file  . Food insecurity:    Worry: Not on file    Inability: Not on file  . Transportation needs:    Medical: Not on file    Non-medical: Not on file  Tobacco Use  . Smoking status: Former Research scientist (life sciences)  . Smokeless tobacco: Never Used  . Tobacco comment: 25 years ago quit  Substance and Sexual Activity  . Alcohol use: No  . Drug use: No  . Sexual activity: Not on file  Lifestyle  . Physical activity:    Days per week: Not on file    Minutes per session: Not  on file  . Stress: Not on file  Relationships  . Social connections:    Talks on phone: Not on file    Gets together: Not on file    Attends religious service: Not on file    Active member of club or organization: Not on file    Attends meetings of clubs or organizations: Not on file    Relationship status: Not on file  . Intimate partner violence:    Fear of current or ex partner: Not on file    Emotionally abused: Not on file    Physically abused: Not on file    Forced sexual activity: Not on file  Other Topics Concern  . Not on file  Social History Narrative  . Not on file    Family History  Problem Relation Age of Onset  . Arthritis Mother   . Hypertension Mother     Allergies  Allergen Reactions  . Shellfish Allergy      Review of Systems   Review of Systems: Negative Unless Checked Constitutional: [] Weight loss  [] Fever  [] Chills Cardiac: [] Chest pain   []  Atrial Fibrillation  [] Palpitations   [] Shortness of breath when laying flat   []   Shortness of breath with exertion. [] Shortness of breath at rest Vascular:  [] Pain in legs with walking   [] Pain in legs with standing [] Pain in legs when laying flat   [] Claudication    [] Pain in feet when laying flat    [] History of DVT   [] Phlebitis   [x] Swelling in legs   [] Varicose veins   [] Non-healing ulcers Pulmonary:   [] Uses home oxygen   [] Productive cough   [] Hemoptysis   [] Wheeze  [] COPD   [] Asthma Neurologic:  [] Dizziness   [] Seizures  [] Blackouts [] History of stroke   [] History of TIA  [] Aphasia   [] Temporary Blindness   [] Weakness or numbness in arm   [x] Weakness or numbness in leg Musculoskeletal:   [] Joint swelling   [] Joint pain   [] Low back pain  []  History of Knee Replacement [] Arthritis [] back Surgeries  []  Spinal Stenosis    Hematologic:  [] Easy bruising  [] Easy bleeding   [] Hypercoagulable state   [] Anemic Gastrointestinal:  [] Diarrhea   [] Vomiting  [] Gastroesophageal reflux/heartburn   [] Difficulty swallowing.  [] Abdominal pain Genitourinary:  [] Chronic kidney disease   [] Difficult urination  [] Anuric   [] Blood in urine [] Frequent urination  [] Burning with urination   [] Hematuria Skin:  [] Rashes   [x] Ulcers [] Wounds Psychological:  [] History of anxiety   []  History of major depression  []  Memory Difficulties      OBJECTIVE:   Physical Exam  BP (!) 173/95 (BP Location: Right Arm)   Pulse 64   Resp 16   Ht 5\' 10"  (1.778 m)   Wt 296 lb (134.3 kg)   BMI 42.47 kg/m   Gen: WD/WN, NAD Head: [place]/AT, No temporalis wasting.  Ear/Nose/Throat: Hearing grossly intact, nares w/o erythema or drainage Eyes: PER, EOMI, sclera nonicteric.  Neck: Supple, no masses.  No JVD.  Pulmonary:  Good air movement, no use of accessory muscles.  Cardiac: RRR Vascular:  2+ nonpitting edema bilaterally Vessel Right Left  Radial Palpable Palpable  Dorsalis Pedis Palpable Palpable  Posterior Tibial Palpable Palpable   Gastrointestinal: soft, non-distended. No guarding/no peritoneal signs.  Musculoskeletal: M/S 5/5 throughout.  No deformity or atrophy.  Neurologic: Pain and light touch intact in extremities.  Symmetrical.  Speech is fluent. Motor exam as listed above. Psychiatric: Judgment intact, Mood & affect appropriate for pt's clinical situation. Dermatologic:  Shallow ulceration on right lower extremity. No changes consistent with cellulitis. Lymph : No Cervical lymphadenopathy, no lichenification or skin changes of chronic lymphedema.       ASSESSMENT AND PLAN:  1. Venous ulcer of ankle, right ([place]) The patient still has some shallow ulceration of his right lower extremity.  We will continue on wraps of this lower extremity.  Patient was urged to continue to elevate leg as much as possible.  As well as to utilize his lymphedema pump.  We will have the patient follow-up in 4 weeks in order to reassess the degree of swelling and the continued need for Unna wraps.  2. Lymphedema No surgery or intervention  at this point in time.  I have reviewed my discussion with the patient regarding venous insufficiency and why it causes symptoms. I have discussed with the patient the chronic skin changes that accompany venous insufficiency and the long term sequela such as ulceration. Patient will contnue wearing graduated compression stockings on a daily basis, as this has provided excellent control of his edema. The patient will put the stockings on first thing in the morning and removing them in the evening. The patient is reminded  not to sleep in the stockings.  In addition, behavioral modification including elevation during the day will be initiated. Exercise is strongly encouraged.    3. Benign hypertension with chronic kidney disease, stage III (HCC) Continue antihypertensive medications as already ordered, these medications have been reviewed and there are no changes at this time.    Current Outpatient Medications on File Prior to Visit  Medication Sig Dispense Refill  . allopurinol (ZYLOPRIM) 100 MG tablet Take 100 mg by mouth daily.    Marland Kitchen aspirin 81 MG tablet Take 81 mg by mouth daily.    Marland Kitchen diltiazem (CARDIZEM LA) 240 MG 24 hr tablet Take 240 mg by mouth daily.    Marland Kitchen docusate sodium (COLACE) 100 MG capsule Take 100 mg by mouth daily.    . fexofenadine (ALLEGRA) 180 MG tablet Take 180 mg by mouth daily as needed.    . fluticasone (FLONASE) 50 MCG/ACT nasal spray Place into the nose.    . furosemide (LASIX) 40 MG tablet     . losartan (COZAAR) 100 MG tablet Take 50 mg by mouth daily.     . metoprolol succinate (TOPROL-XL) 50 MG 24 hr tablet     . Multiple Vitamin (ONE-A-DAY MENS PO) Take by mouth daily.    Marland Kitchen nystatin (MYCOSTATIN/NYSTOP) powder Apply topically.    Marland Kitchen olmesartan-hydrochlorothiazide (BENICAR HCT) 40-12.5 MG tablet TAKE 1 TABLET BY MOUTH ONCE DAILY    . Rivaroxaban (XARELTO) 15 MG TABS tablet Take 20 mg by mouth.     . torsemide (DEMADEX) 20 MG tablet     . traMADol (ULTRAM) 50 MG  tablet Take 1 tablet (50 mg total) by mouth every 6 (six) hours as needed. 30 tablet 0   No current facility-administered medications on file prior to visit.     There are no Patient Instructions on file for this visit. No follow-ups on file.   Kris Hartmann, NP  This note was completed with Sales executive.  Any errors are purely unintentional.

## 2018-09-16 NOTE — Telephone Encounter (Signed)
I can get homehealth for his unna boots but the help for showering and other home related issues will need to go through his PCP.

## 2018-09-17 NOTE — Telephone Encounter (Signed)
[facility] was the homehealth that was called to go out to the patient for unna boot changes, Justin Shannon called to let me know that Clarise Cruz from [facility] contacted her and per the order from the patient's primary care they will be going out for other nursing issues so they will also do the unna boots as well.

## 2018-09-22 ENCOUNTER — Encounter (INDEPENDENT_AMBULATORY_CARE_PROVIDER_SITE_OTHER): Payer: Medicare Other

## 2018-09-28 ENCOUNTER — Other Ambulatory Visit: Payer: Self-pay

## 2018-09-28 ENCOUNTER — Ambulatory Visit (INDEPENDENT_AMBULATORY_CARE_PROVIDER_SITE_OTHER): Payer: Medicare Other | Admitting: Nurse Practitioner

## 2018-09-28 ENCOUNTER — Encounter (INDEPENDENT_AMBULATORY_CARE_PROVIDER_SITE_OTHER): Payer: Self-pay | Admitting: Nurse Practitioner

## 2018-09-28 VITALS — BP 131/76 | HR 65 | Resp 16 | Ht 70.0 in | Wt 293.0 lb

## 2018-09-28 DIAGNOSIS — I129 Hypertensive chronic kidney disease with stage 1 through stage 4 chronic kidney disease, or unspecified chronic kidney disease: Secondary | ICD-10-CM

## 2018-09-28 DIAGNOSIS — N183 Chronic kidney disease, stage 3 unspecified: Secondary | ICD-10-CM

## 2018-09-28 DIAGNOSIS — L97319 Non-pressure chronic ulcer of right ankle with unspecified severity: Secondary | ICD-10-CM | POA: Diagnosis not present

## 2018-09-28 DIAGNOSIS — I83013 Varicose veins of right lower extremity with ulcer of ankle: Secondary | ICD-10-CM | POA: Diagnosis not present

## 2018-09-28 DIAGNOSIS — I890 Lymphedema, not elsewhere classified: Secondary | ICD-10-CM

## 2018-09-29 ENCOUNTER — Ambulatory Visit (INDEPENDENT_AMBULATORY_CARE_PROVIDER_SITE_OTHER): Payer: Medicare Other | Admitting: Nurse Practitioner

## 2018-10-07 ENCOUNTER — Encounter (INDEPENDENT_AMBULATORY_CARE_PROVIDER_SITE_OTHER): Payer: Self-pay | Admitting: Nurse Practitioner

## 2018-10-07 NOTE — Progress Notes (Signed)
SUBJECTIVE:  Patient ID: Justin Shannon., male    DOB: May 05, 1941, 78 y.o.   MRN: [id number] Chief Complaint  Patient presents with   Follow-up    4week unna check    HPI  [facility] [name] is a 78 y.o. male presents today for evaluation of ulceration on his right lower extremity.  Today in the ulceration appears to be better however there is some new concerning areas that appear to be early ulcerative areas.  Also, he is Unna wraps have not been wrapped high enough by home health so he has significant edema at the top portion of his calf.  Patient denies any fever, chills, nausea, diarrhea.  He denies any chest pain or shortness of breath.  He has been tolerating his own wraps well.  Past Medical History:  Diagnosis Date   Arthritis    Bilateral swelling of feet    BPH (benign prostatic hyperplasia)    DVT (deep venous thrombosis) (HCC)    right lower extremity   History of gout    Hypertension    Tendonitis of foot    left    Past Surgical History:  Procedure Laterality Date   APPENDECTOMY     HERNIA REPAIR      Social History   Socioeconomic History   Marital status: Married    Spouse name: Not on file   Number of children: Not on file   Years of education: Not on file   Highest education level: Not on file  Occupational History   Not on file  Social Needs   Financial resource strain: Not on file   Food insecurity:    Worry: Not on file    Inability: Not on file   Transportation needs:    Medical: Not on file    Non-medical: Not on file  Tobacco Use   Smoking status: Former Smoker   Smokeless tobacco: Never Used   Tobacco comment: 25 years ago quit  Substance and Sexual Activity   Alcohol use: No   Drug use: No   Sexual activity: Not on file  Lifestyle   Physical activity:    Days per week: Not on file    Minutes per session: Not on file   Stress: Not on file  Relationships   Social connections:    Talks on phone:  Not on file    Gets together: Not on file    Attends religious service: Not on file    Active member of club or organization: Not on file    Attends meetings of clubs or organizations: Not on file    Relationship status: Not on file   Intimate partner violence:    Fear of current or ex partner: Not on file    Emotionally abused: Not on file    Physically abused: Not on file    Forced sexual activity: Not on file  Other Topics Concern   Not on file  Social History Narrative   Not on file    Family History  Problem Relation Age of Onset   Arthritis Mother    Hypertension Mother     Allergies  Allergen Reactions   Shellfish Allergy      Review of Systems   Review of Systems: Negative Unless Checked Constitutional: [] Weight loss  [] Fever  [] Chills Cardiac: [] Chest pain   []  Atrial Fibrillation  [] Palpitations   [] Shortness of breath when laying flat   [] Shortness of breath with exertion. [] Shortness of breath at rest  Vascular:  [] Pain in legs with walking   [] Pain in legs with standing [] Pain in legs when laying flat   [] Claudication    [] Pain in feet when laying flat    [] History of DVT   [] Phlebitis   [x] Swelling in legs   [] Varicose veins   [x] Non-healing ulcers Pulmonary:   [] Uses home oxygen   [] Productive cough   [] Hemoptysis   [] Wheeze  [] COPD   [] Asthma Neurologic:  [] Dizziness   [x] Seizures  [] Blackouts [] History of stroke   [] History of TIA  [] Aphasia   [] Temporary Blindness   [] Weakness or numbness in arm   [x] Weakness or numbness in leg Musculoskeletal:   [] Joint swelling   [] Joint pain   [] Low back pain  []  History of Knee Replacement [] Arthritis [] back Surgeries  []  Spinal Stenosis    Hematologic:  [] Easy bruising  [] Easy bleeding   [] Hypercoagulable state   [] Anemic Gastrointestinal:  [] Diarrhea   [] Vomiting  [] Gastroesophageal reflux/heartburn   [] Difficulty swallowing. [] Abdominal pain Genitourinary:  [x] Chronic kidney disease   [] Difficult urination   [] Anuric   [] Blood in urine [] Frequent urination  [] Burning with urination   [] Hematuria Skin:  [] Rashes   [] Ulcers [] Wounds Psychological:  [] History of anxiety   []  History of major depression  []  Memory Difficulties      OBJECTIVE:   Physical Exam  BP 131/76 (BP Location: Right Arm)    Pulse 65    Resp 16    Ht 5\' 10"  (1.778 m)    Wt 293 lb (132.9 kg)    BMI 42.04 kg/m   Gen: WD/WN, NAD Head: [place]/AT, No temporalis wasting.  Ear/Nose/Throat: Hearing grossly intact, nares w/o erythema or drainage Eyes: PER, EOMI, sclera nonicteric.  Neck: Supple, no masses.  No JVD.  Pulmonary:  Good air movement, no use of accessory muscles.  Cardiac: RRR Vascular:  Vessel Right Left  Radial Palpable Palpable  Dorsalis Pedis Palpable Palpable  Posterior Tibial Palpable Palpable   Gastrointestinal: soft, non-distended. No guarding/no peritoneal signs.  Musculoskeletal: M/S 5/5 throughout.  No deformity or atrophy.  Neurologic: Pain and light touch intact in extremities.  Symmetrical.  Speech is fluent. Motor exam as listed above. Psychiatric: Judgment intact, Mood & affect appropriate for pt's clinical situation. Dermatologic:  Ulcers on right lower extremity.  No changes consistent with cellulitis. Lymph : No Cervical lymphadenopathy, no lichenification or skin changes of chronic lymphedema.       ASSESSMENT AND PLAN:  1. Venous ulcer of ankle, right (HCC) We will continue with Unna wraps done by home health.  Instructed the patient that his wrap should come up to the bottom portion of the knee.  We also placed a call into home health to ensure that they were aware how far the interaction go up.  The patient will follow-up in 6 weeks for wound evaluation.  2. Lymphedema Following Unna wrap therapy the patient will transition to medical grade 1 compression stockings to be worn daily basis.  Patient is instructed that although he is in [place] wraps he should continue to elevate his lower  extremities as much as possible.  He should also exercise as much as he is able to tolerate.  The patient also has a lymphedema pump.  The patient needs assistance with Unna wrap however he will speak to his family about assisting him so that he can utilize it for an hour twice a day while in [employer]. 3. Benign hypertension with chronic kidney disease, stage III (HCC) Continue antihypertensive medications  as already ordered, these medications have been reviewed and there are no changes at this time.    Current Outpatient Medications on File Prior to Visit  Medication Sig Dispense Refill   allopurinol (ZYLOPRIM) 100 MG tablet Take 100 mg by mouth daily.     aspirin 81 MG tablet Take 81 mg by mouth daily.     diltiazem (CARDIZEM LA) 240 MG 24 hr tablet Take 240 mg by mouth daily.     docusate sodium (COLACE) 100 MG capsule Take 100 mg by mouth daily.     fexofenadine (ALLEGRA) 180 MG tablet Take 180 mg by mouth daily as needed.     fluticasone (FLONASE) 50 MCG/ACT nasal spray Place into the nose.     furosemide (LASIX) 40 MG tablet      losartan (COZAAR) 100 MG tablet Take 50 mg by mouth daily.      metoprolol succinate (TOPROL-XL) 50 MG 24 hr tablet      Multiple Vitamin (ONE-A-DAY MENS PO) Take by mouth daily.     nystatin (MYCOSTATIN/NYSTOP) powder Apply topically.     olmesartan-hydrochlorothiazide (BENICAR HCT) 40-12.5 MG tablet TAKE 1 TABLET BY MOUTH ONCE DAILY     Rivaroxaban (XARELTO) 15 MG TABS tablet Take 20 mg by mouth.      torsemide (DEMADEX) 20 MG tablet      traMADol (ULTRAM) 50 MG tablet Take 1 tablet (50 mg total) by mouth every 6 (six) hours as needed. 30 tablet 0   No current facility-administered medications on file prior to visit.     There are no Patient Instructions on file for this visit. No follow-ups on file.   Kris Hartmann, NP  This note was completed with Sales executive.  Any errors are purely unintentional.

## 2018-11-04 ENCOUNTER — Ambulatory Visit: Payer: Medicare Other | Admitting: Podiatry

## 2018-11-09 ENCOUNTER — Ambulatory Visit (INDEPENDENT_AMBULATORY_CARE_PROVIDER_SITE_OTHER): Payer: Medicare Other | Admitting: Nurse Practitioner

## 2018-11-09 ENCOUNTER — Other Ambulatory Visit: Payer: Self-pay

## 2018-11-09 VITALS — BP 123/75 | HR 76 | Resp 16 | Wt 290.0 lb

## 2018-11-09 DIAGNOSIS — Z87891 Personal history of nicotine dependence: Secondary | ICD-10-CM

## 2018-11-09 DIAGNOSIS — I890 Lymphedema, not elsewhere classified: Secondary | ICD-10-CM

## 2018-11-09 DIAGNOSIS — I129 Hypertensive chronic kidney disease with stage 1 through stage 4 chronic kidney disease, or unspecified chronic kidney disease: Secondary | ICD-10-CM | POA: Diagnosis not present

## 2018-11-09 DIAGNOSIS — I83013 Varicose veins of right lower extremity with ulcer of ankle: Secondary | ICD-10-CM

## 2018-11-09 DIAGNOSIS — N183 Chronic kidney disease, stage 3 unspecified: Secondary | ICD-10-CM

## 2018-11-09 DIAGNOSIS — L97319 Non-pressure chronic ulcer of right ankle with unspecified severity: Secondary | ICD-10-CM

## 2018-11-11 ENCOUNTER — Telehealth (INDEPENDENT_AMBULATORY_CARE_PROVIDER_SITE_OTHER): Payer: Self-pay

## 2018-11-11 NOTE — Telephone Encounter (Signed)
Dianne from [facility] left a message to see if the patient will be continuing services for nurse visit.She said she spoke with patient wife but at time she was not able to give appointment dates.I inform the nurse that the patient will be coming the office to have unna boots place.She asked was there a reason I inform that it was a patient preference.The nurse will be discharging the patient for nurse visits but he will continue to receive services for occupational therapy.

## 2018-11-15 ENCOUNTER — Encounter (INDEPENDENT_AMBULATORY_CARE_PROVIDER_SITE_OTHER): Payer: Self-pay | Admitting: Nurse Practitioner

## 2018-11-15 NOTE — Progress Notes (Signed)
SUBJECTIVE:  [name], male    DOB: Nov 17, 1940, 78 y.o.   MRN: [id number] Chief Complaint  Patient presents with  . Follow-up    unna check    HPI  [facility]. is a 78 y.o. male that presents today for unna wrap follow-up.  The patient previously has had his Unna wraps done by home health.  Different techniques of left the patient with continued ulcerations.  The patient states they are not super painful.  The patient states that home health has ceased services at this point time.  He denies any fever, chills, nausea, vomiting or diarrhea.  Past Medical History:  Diagnosis Date  . Arthritis   . Bilateral swelling of feet   . BPH (benign prostatic hyperplasia)   . DVT (deep venous thrombosis) (HCC)    right lower extremity  . History of gout   . Hypertension   . Tendonitis of foot    left    Past Surgical History:  Procedure Laterality Date  . APPENDECTOMY    . HERNIA REPAIR      Social History   Socioeconomic History  . Marital status: Married    Spouse name: Not on file  . Number of children: Not on file  . Years of education: Not on file  . Highest education level: Not on file  Occupational History  . Not on file  Social Needs  . Financial resource strain: Not on file  . Food insecurity:    Worry: Not on file    Inability: Not on file  . Transportation needs:    Medical: Not on file    Non-medical: Not on file  Tobacco Use  . Smoking status: Former Research scientist (life sciences)  . Smokeless tobacco: Never Used  . Tobacco comment: 25 years ago quit  Substance and Sexual Activity  . Alcohol use: No  . Drug use: No  . Sexual activity: Not on file  Lifestyle  . Physical activity:    Days per week: Not on file    Minutes per session: Not on file  . Stress: Not on file  Relationships  . Social connections:    Talks on phone: Not on file    Gets together: Not on file    Attends religious service: Not on file    Active member of club or  organization: Not on file    Attends meetings of clubs or organizations: Not on file    Relationship status: Not on file  . Intimate partner violence:    Fear of current or ex partner: Not on file    Emotionally abused: Not on file    Physically abused: Not on file    Forced sexual activity: Not on file  Other Topics Concern  . Not on file  Social History Narrative  . Not on file    Family History  Problem Relation Age of Onset  . Arthritis Mother   . Hypertension Mother     Allergies  Allergen Reactions  . Shellfish Allergy      Review of Systems   Review of Systems: Negative Unless Checked Constitutional: [] Weight loss  [] Fever  [] Chills Cardiac: [] Chest pain   []  Atrial Fibrillation  [] Palpitations   [] Shortness of breath when laying flat   [] Shortness of breath with exertion. [] Shortness of breath at rest Vascular:  [] Pain in legs with walking   [] Pain in legs with standing [] Pain in legs when laying flat   [] Claudication    []   Pain in feet when laying flat    [] History of DVT   [] Phlebitis   [x] Swelling in legs   [] Varicose veins   [x] Non-healing ulcers Pulmonary:   [] Uses home oxygen   [] Productive cough   [] Hemoptysis   [] Wheeze  [] COPD   [] Asthma Neurologic:  [] Dizziness   [] Seizures  [] Blackouts [] History of stroke   [] History of TIA  [] Aphasia   [] Temporary Blindness   [] Weakness or numbness in arm   [] Weakness or numbness in leg Musculoskeletal:   [] Joint swelling   [x] Joint pain   [] Low back pain  []  History of Knee Replacement [x] Arthritis [] back Surgeries  []  Spinal Stenosis    Hematologic:  [] Easy bruising  [] Easy bleeding   [] Hypercoagulable state   [] Anemic Gastrointestinal:  [] Diarrhea   [] Vomiting  [] Gastroesophageal reflux/heartburn   [] Difficulty swallowing. [] Abdominal pain Genitourinary:  [] Chronic kidney disease   [] Difficult urination  [] Anuric   [] Blood in urine [] Frequent urination  [] Burning with urination   [] Hematuria Skin:  [] Rashes   [] Ulcers  [] Wounds Psychological:  [] History of anxiety   []  History of major depression  []  Memory Difficulties      OBJECTIVE:   Physical Exam  BP 123/75 (BP Location: Right Arm)   Pulse 76   Resp 16   Wt 290 lb (131.5 kg)   BMI 41.61 kg/m   Gen: WD/WN, NAD Head: [place]/AT, No temporalis wasting.  Ear/Nose/Throat: Hearing grossly intact, nares w/o erythema or drainage Eyes: PER, EOMI, sclera nonicteric.  Neck: Supple, no masses.  No JVD.  Pulmonary:  Good air movement, no use of accessory muscles.  Cardiac: RRR Vascular:  Small ulceration on right lower extremity, 2+ soft edema bilaterally Vessel Right Left  Radial Palpable Palpable  Dorsalis Pedis Palpable Palpable  Posterior Tibial Palpable Palpable   Gastrointestinal: soft, non-distended. No guarding/no peritoneal signs.  Musculoskeletal: Ambulates with cane No deformity or atrophy.  Neurologic: Pain and light touch intact in extremities.  Symmetrical.  Speech is fluent. Motor exam as listed above. Psychiatric: Judgment intact, Mood & affect appropriate for pt's clinical situation. Dermatologic: No Venous rashes. No Ulcers Noted.  No changes consistent with cellulitis. Lymph : No Cervical lymphadenopathy, no lichenification or skin changes of chronic lymphedema.       ASSESSMENT AND PLAN:  1. Venous ulcer of ankle, right ([place]) Patient will continue to have Unna wraps done.  He will have them done in our office and changed weekly.  We will follow-up in 4 weeks to determine ulceration progress.  2. Benign hypertension with chronic kidney disease, stage III (HCC) Continue antihypertensive medications as already ordered, these medications have been reviewed and there are no changes at this time.   3. Lymphedema Patient is advised that although he is in [place] wraps he should still continue with conservative therapy, such as elevating his legs as much as possible, exercise at least 30 minutes a day and NSAIDs for pain relief.  The  patient also has a lymphedema pump.  He is encouraged to continue to utilize his pump at least 1 hour/day with his Unna wraps in order to have the most effective treatment.   Current Outpatient Medications on File Prior to Visit  Medication Sig Dispense Refill  . allopurinol (ZYLOPRIM) 100 MG tablet Take 100 mg by mouth daily.    Marland Kitchen aspirin 81 MG tablet Take 81 mg by mouth daily.    Marland Kitchen diltiazem (CARDIZEM LA) 240 MG 24 hr tablet Take 240 mg by mouth daily.    Marland Kitchen  docusate sodium (COLACE) 100 MG capsule Take 100 mg by mouth daily.    . fexofenadine (ALLEGRA) 180 MG tablet Take 180 mg by mouth daily as needed.    . fluticasone (FLONASE) 50 MCG/ACT nasal spray Place into the nose.    . furosemide (LASIX) 40 MG tablet     . losartan (COZAAR) 100 MG tablet Take 50 mg by mouth daily.     . metoprolol succinate (TOPROL-XL) 50 MG 24 hr tablet     . Multiple Vitamin (ONE-A-DAY MENS PO) Take by mouth daily.    Marland Kitchen nystatin (MYCOSTATIN/NYSTOP) powder Apply topically.    Marland Kitchen olmesartan-hydrochlorothiazide (BENICAR HCT) 40-12.5 MG tablet TAKE 1 TABLET BY MOUTH ONCE DAILY    . Rivaroxaban (XARELTO) 15 MG TABS tablet Take 20 mg by mouth.     . torsemide (DEMADEX) 20 MG tablet     . traMADol (ULTRAM) 50 MG tablet Take 1 tablet (50 mg total) by mouth every 6 (six) hours as needed. 30 tablet 0   No current facility-administered medications on file prior to visit.     There are no Patient Instructions on file for this visit. Return in about 4 weeks (around 12/07/2018) for Unna Check.   Kris Hartmann, NP  This note was completed with Sales executive.  Any errors are purely unintentional.

## 2018-11-16 ENCOUNTER — Other Ambulatory Visit: Payer: Self-pay

## 2018-11-16 ENCOUNTER — Encounter (INDEPENDENT_AMBULATORY_CARE_PROVIDER_SITE_OTHER): Payer: Self-pay | Admitting: Nurse Practitioner

## 2018-11-16 ENCOUNTER — Ambulatory Visit (INDEPENDENT_AMBULATORY_CARE_PROVIDER_SITE_OTHER): Payer: Medicare Other | Admitting: Nurse Practitioner

## 2018-11-16 VITALS — BP 138/79 | HR 80 | Resp 12 | Ht 70.0 in | Wt 292.0 lb

## 2018-11-16 DIAGNOSIS — L97319 Non-pressure chronic ulcer of right ankle with unspecified severity: Secondary | ICD-10-CM

## 2018-11-16 DIAGNOSIS — I83013 Varicose veins of right lower extremity with ulcer of ankle: Secondary | ICD-10-CM | POA: Diagnosis not present

## 2018-11-16 NOTE — Progress Notes (Signed)
History of Present Illness  There is no documented history at this time  Assessments & Plan   There are no diagnoses linked to this encounter.    Additional instructions  Subjective:  Patient presents with venous ulcer of the Right lower extremity.    Procedure:  3 layer unna wrap was placed Right lower extremity.   Plan:   Follow up in one week.   

## 2018-11-23 ENCOUNTER — Encounter (INDEPENDENT_AMBULATORY_CARE_PROVIDER_SITE_OTHER): Payer: Self-pay

## 2018-11-23 ENCOUNTER — Other Ambulatory Visit: Payer: Self-pay

## 2018-11-23 ENCOUNTER — Ambulatory Visit (INDEPENDENT_AMBULATORY_CARE_PROVIDER_SITE_OTHER): Payer: Medicare Other | Admitting: Nurse Practitioner

## 2018-11-23 VITALS — BP 141/83 | HR 69 | Resp 16

## 2018-11-23 DIAGNOSIS — L97319 Non-pressure chronic ulcer of right ankle with unspecified severity: Secondary | ICD-10-CM

## 2018-11-23 DIAGNOSIS — I83013 Varicose veins of right lower extremity with ulcer of ankle: Secondary | ICD-10-CM | POA: Diagnosis not present

## 2018-11-23 NOTE — Progress Notes (Signed)
History of Present Illness  There is no documented history at this time  Assessments & Plan   There are no diagnoses linked to this encounter.    Additional instructions  Subjective:  Patient presents with venous ulcer of the Right lower extremity.    Procedure:  3 layer unna wrap was placed Right lower extremity.   Plan:   Follow up in one week.   

## 2018-11-25 ENCOUNTER — Ambulatory Visit (INDEPENDENT_AMBULATORY_CARE_PROVIDER_SITE_OTHER): Payer: Medicare Other | Admitting: Vascular Surgery

## 2018-11-25 ENCOUNTER — Other Ambulatory Visit: Payer: Self-pay | Admitting: Internal Medicine

## 2018-11-25 DIAGNOSIS — N183 Chronic kidney disease, stage 3 unspecified: Secondary | ICD-10-CM

## 2018-11-25 DIAGNOSIS — N281 Cyst of kidney, acquired: Secondary | ICD-10-CM

## 2018-11-30 ENCOUNTER — Encounter (INDEPENDENT_AMBULATORY_CARE_PROVIDER_SITE_OTHER): Payer: Self-pay | Admitting: Nurse Practitioner

## 2018-11-30 ENCOUNTER — Ambulatory Visit (INDEPENDENT_AMBULATORY_CARE_PROVIDER_SITE_OTHER): Payer: Medicare Other | Admitting: Nurse Practitioner

## 2018-11-30 ENCOUNTER — Other Ambulatory Visit: Payer: Self-pay

## 2018-11-30 VITALS — BP 135/79 | HR 83 | Resp 14 | Ht 70.0 in | Wt 292.0 lb

## 2018-11-30 DIAGNOSIS — I83013 Varicose veins of right lower extremity with ulcer of ankle: Secondary | ICD-10-CM | POA: Diagnosis not present

## 2018-11-30 DIAGNOSIS — L97319 Non-pressure chronic ulcer of right ankle with unspecified severity: Secondary | ICD-10-CM | POA: Diagnosis not present

## 2018-11-30 NOTE — Progress Notes (Signed)
History of Present Illness  There is no documented history at this time  Assessments & Plan   There are no diagnoses linked to this encounter.    Additional instructions  Subjective:  Patient presents with venous ulcer of the Right lower extremity.    Procedure:  3 layer unna wrap was placed Right lower extremity.   Plan:   Follow up in one week.   

## 2018-12-07 ENCOUNTER — Other Ambulatory Visit: Payer: Self-pay

## 2018-12-07 ENCOUNTER — Ambulatory Visit (INDEPENDENT_AMBULATORY_CARE_PROVIDER_SITE_OTHER): Payer: Medicare Other | Admitting: Nurse Practitioner

## 2018-12-07 ENCOUNTER — Encounter (INDEPENDENT_AMBULATORY_CARE_PROVIDER_SITE_OTHER): Payer: Self-pay | Admitting: Nurse Practitioner

## 2018-12-07 VITALS — BP 126/64 | HR 73 | Resp 16 | Wt 291.0 lb

## 2018-12-07 DIAGNOSIS — I129 Hypertensive chronic kidney disease with stage 1 through stage 4 chronic kidney disease, or unspecified chronic kidney disease: Secondary | ICD-10-CM

## 2018-12-07 DIAGNOSIS — L97319 Non-pressure chronic ulcer of right ankle with unspecified severity: Secondary | ICD-10-CM | POA: Diagnosis not present

## 2018-12-07 DIAGNOSIS — I890 Lymphedema, not elsewhere classified: Secondary | ICD-10-CM | POA: Diagnosis not present

## 2018-12-07 DIAGNOSIS — N183 Chronic kidney disease, stage 3 unspecified: Secondary | ICD-10-CM

## 2018-12-07 DIAGNOSIS — I83013 Varicose veins of right lower extremity with ulcer of ankle: Secondary | ICD-10-CM

## 2018-12-07 DIAGNOSIS — Z87891 Personal history of nicotine dependence: Secondary | ICD-10-CM

## 2018-12-07 DIAGNOSIS — Z79899 Other long term (current) drug therapy: Secondary | ICD-10-CM

## 2018-12-07 NOTE — Progress Notes (Signed)
SUBJECTIVE:  [name], male    DOB: 05-13-1941, 78 y.o.   MRN: [id number] Chief Complaint  Patient presents with  . Follow-up    1week follow up    HPI  [facility]. is a 78 y.o. male the returns for evaluation of lower extremity ulcerations with Unna wraps.  Today the patient actually has several new small ulcerations.  This may be due to constant rubbing through the Unna wraps that has loosened dead skin and revealed open areas.  These wounds are slightly painful but not unbearable.  The patient denies any fever, chills, nausea, vomiting or diarrhea.  He denies any weeping.  Past Medical History:  Diagnosis Date  . Arthritis   . Bilateral swelling of feet   . BPH (benign prostatic hyperplasia)   . DVT (deep venous thrombosis) (HCC)    right lower extremity  . History of gout   . Hypertension   . Tendonitis of foot    left    Past Surgical History:  Procedure Laterality Date  . APPENDECTOMY    . HERNIA REPAIR      Social History   Socioeconomic History  . Marital status: Married    Spouse name: Not on file  . Number of children: Not on file  . Years of education: Not on file  . Highest education level: Not on file  Occupational History  . Not on file  Social Needs  . Financial resource strain: Not on file  . Food insecurity    Worry: Not on file    Inability: Not on file  . Transportation needs    Medical: Not on file    Non-medical: Not on file  Tobacco Use  . Smoking status: Former Research scientist (life sciences)  . Smokeless tobacco: Never Used  . Tobacco comment: 25 years ago quit  Substance and Sexual Activity  . Alcohol use: No  . Drug use: No  . Sexual activity: Not on file  Lifestyle  . Physical activity    Days per week: Not on file    Minutes per session: Not on file  . Stress: Not on file  Relationships  . Social Herbalist on phone: Not on file    Gets together: Not on file    Attends religious service: Not on file   Active member of club or organization: Not on file    Attends meetings of clubs or organizations: Not on file    Relationship status: Not on file  . Intimate partner violence    Fear of current or ex partner: Not on file    Emotionally abused: Not on file    Physically abused: Not on file    Forced sexual activity: Not on file  Other Topics Concern  . Not on file  Social History Narrative  . Not on file    Family History  Problem Relation Age of Onset  . Arthritis Mother   . Hypertension Mother     Allergies  Allergen Reactions  . Shellfish Allergy      Review of Systems   Review of Systems: Negative Unless Checked Constitutional: [] Weight loss  [] Fever  [] Chills Cardiac: [] Chest pain   []  Atrial Fibrillation  [] Palpitations   [] Shortness of breath when laying flat   [] Shortness of breath with exertion. [] Shortness of breath at rest Vascular:  [] Pain in legs with walking   [] Pain in legs with standing [] Pain in legs when laying flat   [] Claudication    []   Pain in feet when laying flat    [] History of DVT   [] Phlebitis   [x] Swelling in legs   [x] Varicose veins   [] Non-healing ulcers Pulmonary:   [] Uses home oxygen   [] Productive cough   [] Hemoptysis   [] Wheeze  [] COPD   [] Asthma Neurologic:  [] Dizziness   [] Seizures  [] Blackouts [] History of stroke   [] History of TIA  [] Aphasia   [] Temporary Blindness   [] Weakness or numbness in arm   [] Weakness or numbness in leg Musculoskeletal:   [] Joint swelling   [] Joint pain   [] Low back pain  []  History of Knee Replacement [x] Arthritis [] back Surgeries  []  Spinal Stenosis    Hematologic:  [] Easy bruising  [] Easy bleeding   [] Hypercoagulable state   [] Anemic Gastrointestinal:  [] Diarrhea   [] Vomiting  [] Gastroesophageal reflux/heartburn   [] Difficulty swallowing. [] Abdominal pain Genitourinary:  [] Chronic kidney disease   [] Difficult urination  [] Anuric   [] Blood in urine [] Frequent urination  [] Burning with urination   [] Hematuria Skin:   [] Rashes   [x] Ulcers [] Wounds Psychological:  [] History of anxiety   []  History of major depression  []  Memory Difficulties      OBJECTIVE:   Physical Exam  BP 126/64 (BP Location: Right Arm)   Pulse 73   Resp 16   Wt 291 lb (132 kg)   BMI 41.75 kg/m   Gen: WD/WN, NAD Head: [place]/AT, No temporalis wasting.  Ear/Nose/Throat: Hearing grossly intact, nares w/o erythema or drainage Eyes: PER, EOMI, sclera nonicteric.  Neck: Supple, no masses.  No JVD.  Pulmonary:  Good air movement, no use of accessory muscles.  Cardiac: RRR Vascular:  Multiple small ulcerations on right lower extremity, 2+ edema Vessel Right Left  Radial Palpable Palpable   Gastrointestinal: soft, non-distended. No guarding/no peritoneal signs.  Musculoskeletal: M/S 5/5 throughout.  No deformity or atrophy.  Neurologic: Pain and light touch intact in extremities.  Symmetrical.  Speech is fluent. Motor exam as listed above. Psychiatric: Judgment intact, Mood & affect appropriate for pt's clinical situation. Dermatologic: No Venous rashes. No changes consistent with cellulitis. Lymph : No Cervical lymphadenopathy,  Lichenification on right lower extremity  ASSESSMENT AND PLAN:  1. Venous ulcer of ankle, right (HCC) No surgery or intervention at this point in time.    I have had a long discussion with the patient regarding venous insufficiency and why it  causes symptoms, specifically venous ulceration . I have discussed with the patient the chronic skin changes that accompany venous insufficiency and the long term sequela such as infection and recurring  ulceration.  Patient will be placed in [employer] which will be changed weekly drainage permitting.  In addition, behavioral modification including several periods of elevation of the lower extremities during the day will be continued. Achieving a position with the ankles at heart level was stressed to the patient  The patient is instructed to begin routine exercise,  especially walking on a daily basis  Patient will return to office to have wraps changed weekly, with a follow up after 4 weeks.   2. Lymphedema We will utilize unna boots for right lower extremity and the patient is urged to continue use of medical grade one compression stocks for left.   3. Benign hypertension with chronic kidney disease, stage III (HCC) Continue antihypertensive medications as already ordered, these medications have been reviewed and there are no changes at this time.    Current Outpatient Medications on File Prior to Visit  Medication Sig Dispense Refill  . allopurinol (ZYLOPRIM)  100 MG tablet Take 100 mg by mouth daily.    Marland Kitchen aspirin 81 MG tablet Take 81 mg by mouth daily.    Marland Kitchen diltiazem (CARDIZEM LA) 240 MG 24 hr tablet Take 240 mg by mouth daily.    Marland Kitchen docusate sodium (COLACE) 100 MG capsule Take 100 mg by mouth daily.    . fexofenadine (ALLEGRA) 180 MG tablet Take 180 mg by mouth daily as needed.    . fluticasone (FLONASE) 50 MCG/ACT nasal spray Place into the nose.    . furosemide (LASIX) 40 MG tablet     . losartan (COZAAR) 100 MG tablet Take 50 mg by mouth daily.     . metoprolol succinate (TOPROL-XL) 50 MG 24 hr tablet     . Multiple Vitamin (ONE-A-DAY MENS PO) Take by mouth daily.    Marland Kitchen nystatin (MYCOSTATIN/NYSTOP) powder Apply topically.    Marland Kitchen olmesartan-hydrochlorothiazide (BENICAR HCT) 40-12.5 MG tablet TAKE 1 TABLET BY MOUTH ONCE DAILY    . Rivaroxaban (XARELTO) 15 MG TABS tablet Take 20 mg by mouth.     . torsemide (DEMADEX) 20 MG tablet     . traMADol (ULTRAM) 50 MG tablet Take 1 tablet (50 mg total) by mouth every 6 (six) hours as needed. (Patient not taking: Reported on 12/07/2018) 30 tablet 0   No current facility-administered medications on file prior to visit.     There are no Patient Instructions on file for this visit. No follow-ups on file.   Kris Hartmann, NP  This note was completed with Sales executive.  Any errors are purely  unintentional.

## 2018-12-14 ENCOUNTER — Encounter (INDEPENDENT_AMBULATORY_CARE_PROVIDER_SITE_OTHER): Payer: Self-pay | Admitting: Nurse Practitioner

## 2018-12-14 ENCOUNTER — Other Ambulatory Visit: Payer: Self-pay

## 2018-12-14 ENCOUNTER — Ambulatory Visit (INDEPENDENT_AMBULATORY_CARE_PROVIDER_SITE_OTHER): Payer: Medicare Other | Admitting: Nurse Practitioner

## 2018-12-14 VITALS — BP 120/80 | HR 86 | Resp 12 | Ht 70.0 in | Wt 290.0 lb

## 2018-12-14 DIAGNOSIS — I83013 Varicose veins of right lower extremity with ulcer of ankle: Secondary | ICD-10-CM

## 2018-12-14 DIAGNOSIS — L97319 Non-pressure chronic ulcer of right ankle with unspecified severity: Secondary | ICD-10-CM

## 2018-12-14 NOTE — Progress Notes (Signed)
History of Present Illness  There is no documented history at this time  Assessments & Plan   There are no diagnoses linked to this encounter.    Additional instructions  Subjective:  Patient presents with venous ulcer of the Right lower extremity.    Procedure:  3 layer unna wrap was placed Right lower extremity.   Plan:   Follow up in one week.   

## 2018-12-21 ENCOUNTER — Other Ambulatory Visit: Payer: Self-pay

## 2018-12-21 ENCOUNTER — Ambulatory Visit (INDEPENDENT_AMBULATORY_CARE_PROVIDER_SITE_OTHER): Payer: Medicare Other | Admitting: Nurse Practitioner

## 2018-12-21 ENCOUNTER — Encounter (INDEPENDENT_AMBULATORY_CARE_PROVIDER_SITE_OTHER): Payer: Self-pay

## 2018-12-21 VITALS — BP 117/71 | HR 80 | Resp 16 | Wt 291.0 lb

## 2018-12-21 DIAGNOSIS — L97319 Non-pressure chronic ulcer of right ankle with unspecified severity: Secondary | ICD-10-CM | POA: Diagnosis not present

## 2018-12-21 DIAGNOSIS — I83013 Varicose veins of right lower extremity with ulcer of ankle: Secondary | ICD-10-CM

## 2018-12-21 NOTE — Progress Notes (Signed)
History of Present Illness  There is no documented history at this time  Assessments & Plan   There are no diagnoses linked to this encounter.    Additional instructions  Subjective:  Patient presents with venous ulcer of the Right lower extremity.    Procedure:  3 layer unna wrap was placed Right lower extremity.   Plan:   Follow up in one week.   

## 2018-12-27 ENCOUNTER — Ambulatory Visit (INDEPENDENT_AMBULATORY_CARE_PROVIDER_SITE_OTHER): Payer: Medicare Other | Admitting: Vascular Surgery

## 2018-12-28 ENCOUNTER — Other Ambulatory Visit: Payer: Self-pay

## 2018-12-28 ENCOUNTER — Encounter (INDEPENDENT_AMBULATORY_CARE_PROVIDER_SITE_OTHER): Payer: Self-pay | Admitting: Nurse Practitioner

## 2018-12-28 ENCOUNTER — Ambulatory Visit (INDEPENDENT_AMBULATORY_CARE_PROVIDER_SITE_OTHER): Payer: Medicare Other | Admitting: Nurse Practitioner

## 2018-12-28 ENCOUNTER — Ambulatory Visit
Admission: RE | Admit: 2018-12-28 | Discharge: 2018-12-28 | Disposition: A | Discharge status: Home / Self Care | Payer: Medicare Other | Source: Ambulatory Visit | Attending: Internal Medicine | Admitting: Internal Medicine

## 2018-12-28 ENCOUNTER — Ambulatory Visit: Payer: Medicare Other

## 2018-12-28 VITALS — BP 130/79 | HR 71 | Resp 14 | Ht 70.0 in | Wt 292.0 lb

## 2018-12-28 DIAGNOSIS — I129 Hypertensive chronic kidney disease with stage 1 through stage 4 chronic kidney disease, or unspecified chronic kidney disease: Secondary | ICD-10-CM

## 2018-12-28 DIAGNOSIS — N183 Chronic kidney disease, stage 3 unspecified: Secondary | ICD-10-CM

## 2018-12-28 DIAGNOSIS — N281 Cyst of kidney, acquired: Secondary | ICD-10-CM | POA: Insufficient documentation

## 2018-12-28 DIAGNOSIS — I890 Lymphedema, not elsewhere classified: Secondary | ICD-10-CM

## 2018-12-28 DIAGNOSIS — Z79899 Other long term (current) drug therapy: Secondary | ICD-10-CM

## 2018-12-28 DIAGNOSIS — L97319 Non-pressure chronic ulcer of right ankle with unspecified severity: Secondary | ICD-10-CM | POA: Diagnosis not present

## 2018-12-28 DIAGNOSIS — I83013 Varicose veins of right lower extremity with ulcer of ankle: Secondary | ICD-10-CM

## 2018-12-28 DIAGNOSIS — Z87891 Personal history of nicotine dependence: Secondary | ICD-10-CM

## 2019-01-02 ENCOUNTER — Encounter (INDEPENDENT_AMBULATORY_CARE_PROVIDER_SITE_OTHER): Payer: Self-pay | Admitting: Nurse Practitioner

## 2019-01-02 NOTE — Progress Notes (Signed)
SUBJECTIVE:  Patient ID: Justin Shannon., male    DOB: [birth year], 78 y.o.   MRN: [id number] Chief Complaint  Patient presents with  . Follow-up    HPI  [facility] Justin Shannon. is a 78 y.o. male that presents today for evaluation of lower extremity ulceration.  Previous ulceration is resolved.  The patient denies that pain that he normally feels with them.  The patient states that he has help for his compression socks and lymph pump at home.  He denies any fever, chills, nausea or vomiting.   Past Medical History:  Diagnosis Date  . Arthritis   . Bilateral swelling of feet   . BPH (benign prostatic hyperplasia)   . DVT (deep venous thrombosis) (HCC)    right lower extremity  . History of gout   . Hypertension   . Tendonitis of foot    left    Past Surgical History:  Procedure Laterality Date  . APPENDECTOMY    . HERNIA REPAIR      Social History   Socioeconomic History  . Marital status: Married    Spouse name: Not on file  . Number of children: Not on file  . Years of education: Not on file  . Highest education level: Not on file  Occupational History  . Not on file  Social Needs  . Financial resource strain: Not on file  . Food insecurity    Worry: Not on file    Inability: Not on file  . Transportation needs    Medical: Not on file    Non-medical: Not on file  Tobacco Use  . Smoking status: Former Research scientist (life sciences)  . Smokeless tobacco: Never Used  . Tobacco comment: 25 years ago quit  Substance and Sexual Activity  . Alcohol use: No  . Drug use: No  . Sexual activity: Not on file  Lifestyle  . Physical activity    Days per week: Not on file    Minutes per session: Not on file  . Stress: Not on file  Relationships  . Social Herbalist on phone: Not on file    Gets together: Not on file    Attends religious service: Not on file    Active member of club or organization: Not on file    Attends meetings of clubs or organizations: Not on file   Relationship status: Not on file  . Intimate partner violence    Fear of current or ex partner: Not on file    Emotionally abused: Not on file    Physically abused: Not on file    Forced sexual activity: Not on file  Other Topics Concern  . Not on file  Social History Narrative  . Not on file    Family History  Problem Relation Age of Onset  . Arthritis Mother   . Hypertension Mother     Allergies  Allergen Reactions  . Shellfish Allergy      Review of Systems   Review of Systems: Negative Unless Checked Constitutional: [] Weight loss  [] Fever  [] Chills Cardiac: [] Chest pain   []  Atrial Fibrillation  [] Palpitations   [] Shortness of breath when laying flat   [] Shortness of breath with exertion. [] Shortness of breath at rest Vascular:  [] Pain in legs with walking   [] Pain in legs with standing [] Pain in legs when laying flat   [] Claudication    [] Pain in feet when laying flat    [] History of DVT   [] Phlebitis   [  x]Swelling in legs   [] Varicose veins   [x] Non-healing ulcers Pulmonary:   [] Uses home oxygen   [] Productive cough   [] Hemoptysis   [] Wheeze  [] COPD   [] Asthma Neurologic:  [] Dizziness   [] Seizures  [] Blackouts [] History of stroke   [] History of TIA  [] Aphasia   [] Temporary Blindness   [] Weakness or numbness in arm   [] Weakness or numbness in leg Musculoskeletal:   [] Joint swelling   [x] Joint pain   [] Low back pain  []  History of Knee Replacement [x] Arthritis [] back Surgeries  []  Spinal Stenosis    Hematologic:  [] Easy bruising  [] Easy bleeding   [] Hypercoagulable state   [] Anemic Gastrointestinal:  [] Diarrhea   [] Vomiting  [] Gastroesophageal reflux/heartburn   [] Difficulty swallowing. [] Abdominal pain Genitourinary:  [] Chronic kidney disease   [] Difficult urination  [] Anuric   [] Blood in urine [] Frequent urination  [] Burning with urination   [] Hematuria Skin:  [] Rashes   [] Ulcers [] Wounds Psychological:  [] History of anxiety   []  History of major depression  []  Memory  Difficulties      OBJECTIVE:   Physical Exam  BP 130/79 (BP Location: Left Arm, Patient Position: Sitting, Cuff Size: Large)   Pulse 71   Resp 14   Ht 5\' 10"  (1.778 m)   Wt 292 lb (132.5 kg)   BMI 41.90 kg/m   Gen: WD/WN, NAD Head: [place]/AT, No temporalis wasting.  Ear/Nose/Throat: Hearing grossly intact, nares w/o erythema or drainage Eyes: PER, EOMI, sclera nonicteric.  Neck: Supple, no masses.  No JVD.  Pulmonary:  Good air movement, no use of accessory muscles.  Cardiac: RRR Vascular: 2+ soft edema bilaterally Vessel Right Left  Radial Palpable Palpable  Dorsalis Pedis Palpable Palpable  Posterior Tibial Palpable Palpable   Gastrointestinal: soft, non-distended. No guarding/no peritoneal signs.  Musculoskeletal: M/S 5/5 throughout.  No deformity or atrophy.  Neurologic: Pain and light touch intact in extremities.  Symmetrical.  Speech is fluent. Motor exam as listed above. Psychiatric: Judgment intact, Mood & affect appropriate for pt's clinical situation. Dermatologic: bilateral stasis dermatitis. No Ulcers Noted.  No changes consistent with cellulitis. Lymph : No Cervical lymphadenopathy, lichenification and skin changes of chronic lymphedema present       ASSESSMENT AND PLAN:  1. Venous ulcer of ankle, right ([place]) Resolved at this time, will follow conservative therapy listed below  2. Lymphedema  No surgery or intervention at this point in time.    I have reviewed my discussion with the patient regarding lymphedema and why it  causes symptoms.  Patient will continue wearing graduated compression stockings class 1 (20-30 mmHg) on a daily basis a prescription was given. The patient is reminded to put the stockings on first thing in the morning and removing them in the evening. The patient is instructed specifically not to sleep in the stockings.   In addition, behavioral modification throughout the day will be continued.  This will include frequent elevation (such as  in a recliner), use of over the counter pain medications as needed and exercise such as walking.  I have reviewed systemic causes for chronic edema such as liver, kidney and cardiac etiologies and there does not appear to be any significant changes in these organ systems over the past year.  The patient is under the impression that these organ systems are all stable and unchanged.    The patient will continue aggressive use of the  lymph pump.  This will continue to improve the edema control and prevent sequela such as ulcers and infections.  The patient will follow-up in 3 months.    3. Benign hypertension with chronic kidney disease, stage III (HCC) Continue antihypertensive medications as already ordered, these medications have been reviewed and there are no changes at this time.    Current Outpatient Medications on File Prior to Visit  Medication Sig Dispense Refill  . allopurinol (ZYLOPRIM) 100 MG tablet Take 100 mg by mouth daily.    Marland Kitchen aspirin 81 MG tablet Take 81 mg by mouth daily.    Marland Kitchen diltiazem (CARDIZEM LA) 240 MG 24 hr tablet Take 240 mg by mouth daily.    Marland Kitchen docusate sodium (COLACE) 100 MG capsule Take 100 mg by mouth daily.    . fexofenadine (ALLEGRA) 180 MG tablet Take 180 mg by mouth daily as needed.    . fluticasone (FLONASE) 50 MCG/ACT nasal spray Place into the nose.    . furosemide (LASIX) 40 MG tablet     . losartan (COZAAR) 100 MG tablet Take 50 mg by mouth daily.     . metoprolol succinate (TOPROL-XL) 50 MG 24 hr tablet     . Multiple Vitamin (ONE-A-DAY MENS PO) Take by mouth daily.    Marland Kitchen nystatin (MYCOSTATIN/NYSTOP) powder Apply topically.    Marland Kitchen olmesartan-hydrochlorothiazide (BENICAR HCT) 40-12.5 MG tablet TAKE 1 TABLET BY MOUTH ONCE DAILY    . Rivaroxaban (XARELTO) 15 MG TABS tablet Take 20 mg by mouth.     . torsemide (DEMADEX) 20 MG tablet     . traMADol (ULTRAM) 50 MG tablet Take 1 tablet (50 mg total) by mouth every 6 (six) hours as needed. (Patient not  taking: Reported on 12/07/2018) 30 tablet 0   No current facility-administered medications on file prior to visit.     There are no Patient Instructions on file for this visit. No follow-ups on file.   Kris Hartmann, NP  This note was completed with Sales executive.  Any errors are purely unintentional.

## 2019-01-20 ENCOUNTER — Telehealth (INDEPENDENT_AMBULATORY_CARE_PROVIDER_SITE_OTHER): Payer: Self-pay | Admitting: Nurse Practitioner

## 2019-01-20 NOTE — Telephone Encounter (Signed)
I spoke with Eulogio Ditch NP and she advise for the patient to come in tomorrow

## 2019-01-21 ENCOUNTER — Encounter (INDEPENDENT_AMBULATORY_CARE_PROVIDER_SITE_OTHER): Payer: Self-pay | Admitting: Nurse Practitioner

## 2019-01-21 ENCOUNTER — Ambulatory Visit (INDEPENDENT_AMBULATORY_CARE_PROVIDER_SITE_OTHER): Payer: Medicare Other | Admitting: Nurse Practitioner

## 2019-01-21 ENCOUNTER — Other Ambulatory Visit: Payer: Self-pay

## 2019-01-21 VITALS — BP 113/67 | HR 76 | Resp 16 | Wt 292.0 lb

## 2019-01-21 DIAGNOSIS — I890 Lymphedema, not elsewhere classified: Secondary | ICD-10-CM | POA: Diagnosis not present

## 2019-01-21 DIAGNOSIS — I129 Hypertensive chronic kidney disease with stage 1 through stage 4 chronic kidney disease, or unspecified chronic kidney disease: Secondary | ICD-10-CM

## 2019-01-21 DIAGNOSIS — L97319 Non-pressure chronic ulcer of right ankle with unspecified severity: Secondary | ICD-10-CM | POA: Diagnosis not present

## 2019-01-21 DIAGNOSIS — I83013 Varicose veins of right lower extremity with ulcer of ankle: Secondary | ICD-10-CM | POA: Diagnosis not present

## 2019-01-21 DIAGNOSIS — N183 Chronic kidney disease, stage 3 unspecified: Secondary | ICD-10-CM

## 2019-01-21 NOTE — Progress Notes (Signed)
SUBJECTIVE:  Patient ID: Justin Shannon., male    DOB: March 27, 1941, 78 y.o.   MRN: [id number] Chief Complaint  Patient presents with  . Follow-up    right leg drainage and swelling    HPI  [facility]. is a 78 y.o. male presents today for evaluation of his lower extremities.  The patient was recently removed from [place] wraps due to his healed ulceration however today he has redeveloped multiple ulcerations over his right shin area.  These are very shallow in nature.  There are multiple of these areas.  The patient unfortunately has not been able to utilize medical grade 1 compression stockings.  The patient's arthritis prevents him from placing compression socks and his wife has recently had surgery so she is unable to help as well.  The patient's adult children are trying to help as much as possible.  He does endorse utilizing his lymphedema pump on a daily basis.  He denies any fever, chills, nausea, vomiting or diarrhea.  He denies any chest pain or shortness of breath.  Past Medical History:  Diagnosis Date  . Arthritis   . Bilateral swelling of feet   . BPH (benign prostatic hyperplasia)   . DVT (deep venous thrombosis) (HCC)    right lower extremity  . History of gout   . Hypertension   . Tendonitis of foot    left    Past Surgical History:  Procedure Laterality Date  . APPENDECTOMY    . HERNIA REPAIR      Social History   Socioeconomic History  . Marital status: Married    Spouse name: Not on file  . Number of children: Not on file  . Years of education: Not on file  . Highest education level: Not on file  Occupational History  . Not on file  Social Needs  . Financial resource strain: Not on file  . Food insecurity    Worry: Not on file    Inability: Not on file  . Transportation needs    Medical: Not on file    Non-medical: Not on file  Tobacco Use  . Smoking status: Former Research scientist (life sciences)  . Smokeless tobacco: Never Used  . Tobacco comment: 25 years ago  quit  Substance and Sexual Activity  . Alcohol use: No  . Drug use: No  . Sexual activity: Not on file  Lifestyle  . Physical activity    Days per week: Not on file    Minutes per session: Not on file  . Stress: Not on file  Relationships  . Social Herbalist on phone: Not on file    Gets together: Not on file    Attends religious service: Not on file    Active member of club or organization: Not on file    Attends meetings of clubs or organizations: Not on file    Relationship status: Not on file  . Intimate partner violence    Fear of current or ex partner: Not on file    Emotionally abused: Not on file    Physically abused: Not on file    Forced sexual activity: Not on file  Other Topics Concern  . Not on file  Social History Narrative  . Not on file    Family History  Problem Relation Age of Onset  . Arthritis Mother   . Hypertension Mother     Allergies  Allergen Reactions  . Shellfish Allergy      Review  of Systems   Review of Systems: Negative Unless Checked Constitutional: [] Weight loss  [] Fever  [] Chills Cardiac: [] Chest pain   []  Atrial Fibrillation  [] Palpitations   [] Shortness of breath when laying flat   [] Shortness of breath with exertion. [] Shortness of breath at rest Vascular:  [] Pain in legs with walking   [] Pain in legs with standing [] Pain in legs when laying flat   [] Claudication    [] Pain in feet when laying flat    [x] History of DVT   [] Phlebitis   [x] Swelling in legs   [] Varicose veins   [] Non-healing ulcers Pulmonary:   [] Uses home oxygen   [] Productive cough   [] Hemoptysis   [] Wheeze  [] COPD   [] Asthma Neurologic:  [] Dizziness   [] Seizures  [] Blackouts [] History of stroke   [] History of TIA  [] Aphasia   [] Temporary Blindness   [] Weakness or numbness in arm   [x] Weakness or numbness in leg Musculoskeletal:   [] Joint swelling   [] Joint pain   [] Low back pain  []  History of Knee Replacement [x] Arthritis [] back Surgeries  []  Spinal  Stenosis    Hematologic:  [] Easy bruising  [] Easy bleeding   [] Hypercoagulable state   [x] Anemic Gastrointestinal:  [] Diarrhea   [] Vomiting  [] Gastroesophageal reflux/heartburn   [] Difficulty swallowing. [] Abdominal pain Genitourinary:  [x] Chronic kidney disease   [] Difficult urination  [] Anuric   [] Blood in urine [] Frequent urination  [] Burning with urination   [] Hematuria Skin:  [] Rashes   [x] Ulcers [] Wounds Psychological:  [] History of anxiety   []  History of major depression  []  Memory Difficulties      OBJECTIVE:   Physical Exam  BP 113/67 (BP Location: Right Arm)   Pulse 76   Resp 16   Wt 292 lb (132.5 kg)   BMI 41.90 kg/m   Gen: WD/WN, NAD Head: [place]/AT, No temporalis wasting.  Ear/Nose/Throat: Hearing grossly intact, nares w/o erythema or drainage Eyes: PER, EOMI, sclera nonicteric.  Neck: Supple, no masses.  No JVD.  Pulmonary:  Good air movement, no use of accessory muscles.  Cardiac: RRR Vascular:  Multiple small ulcerations on right calf.  3+ edema bilaterally. Vessel Right Left  Radial Palpable Palpable  Dorsalis Pedis Palpable Palpable  Posterior Tibial Palpable Palpable   Gastrointestinal: soft, non-distended. No guarding/no peritoneal signs.  Musculoskeletal: M/S 5/5 throughout.  No deformity or atrophy.  Neurologic: Pain and light touch intact in extremities.  Symmetrical.  Speech is fluent. Motor exam as listed above. Psychiatric: Judgment intact, Mood & affect appropriate for pt's clinical situation. Dermatologic:  Bilateral stasis dermatitis no Ulcers Noted.  No changes consistent with cellulitis. Lymph : No Cervical lymphadenopathy, lichenification present on right lower extremity     ASSESSMENT AND PLAN:  1. Venous ulcer of ankle, right (HCC) Today we will place the patient in bilateral Unna wraps to treat ulcers as well as to control his swelling.  These will be changed on a weekly basis drainage permitting.  The patient is instructed to continue to  elevate his extremities much as possible.  He is also instructed to exercise as tolerable.  Patient instructed not to get his Unna wraps wet.  I also discussed with the daughter the importance of keeping his skin clean and moisturized once these ulcerations are healed in order to try to prevent further ulceration.  The patient understood and would also attempt to help her following more daily activities to prevent further ulcerations.  Patient will follow-up with provider in 4 weeks for wound evaluation.  2. Lymphedema Following Unna wrap  therapy and discussion with the patient's daughter, the patient will wear medical grade 1 compression stockings on a daily basis to replace first thing in the morning and to be removed at night.  He will elevate his legs as much as possible throughout the day.  He will exercise least 30 minutes a day as tolerated.  He will also continue aggressive use of his lymphedema pump on a daily basis.  3. Benign hypertension with chronic kidney disease, stage III (HCC) Continue antihypertensive medications as already ordered, these medications have been reviewed and there are no changes at this time.    Current Outpatient Medications on File Prior to Visit  Medication Sig Dispense Refill  . allopurinol (ZYLOPRIM) 100 MG tablet Take 100 mg by mouth daily.    Marland Kitchen aspirin 81 MG tablet Take 81 mg by mouth daily.    Marland Kitchen diltiazem (CARDIZEM LA) 240 MG 24 hr tablet Take 240 mg by mouth daily.    Marland Kitchen docusate sodium (COLACE) 100 MG capsule Take 100 mg by mouth daily.    . fexofenadine (ALLEGRA) 180 MG tablet Take 180 mg by mouth daily as needed.    . fluticasone (FLONASE) 50 MCG/ACT nasal spray Place into the nose.    . furosemide (LASIX) 40 MG tablet     . losartan (COZAAR) 100 MG tablet Take 50 mg by mouth daily.     . metoprolol succinate (TOPROL-XL) 50 MG 24 hr tablet     . Multiple Vitamin (ONE-A-DAY MENS PO) Take by mouth daily.    Marland Kitchen nystatin (MYCOSTATIN/NYSTOP) powder Apply  topically.    Marland Kitchen olmesartan-hydrochlorothiazide (BENICAR HCT) 40-12.5 MG tablet TAKE 1 TABLET BY MOUTH ONCE DAILY    . Rivaroxaban (XARELTO) 15 MG TABS tablet Take 20 mg by mouth.     . torsemide (DEMADEX) 20 MG tablet     . traMADol (ULTRAM) 50 MG tablet Take 1 tablet (50 mg total) by mouth every 6 (six) hours as needed. (Patient not taking: Reported on 12/07/2018) 30 tablet 0   No current facility-administered medications on file prior to visit.     There are no Patient Instructions on file for this visit. No follow-ups on file.   Kris Hartmann, NP  This note was completed with Sales executive.  Any errors are purely unintentional.

## 2019-01-27 ENCOUNTER — Ambulatory Visit (INDEPENDENT_AMBULATORY_CARE_PROVIDER_SITE_OTHER): Payer: Medicare Other | Admitting: Podiatry

## 2019-01-27 ENCOUNTER — Other Ambulatory Visit: Payer: Self-pay

## 2019-01-27 ENCOUNTER — Encounter: Payer: Self-pay | Admitting: Podiatry

## 2019-01-27 VITALS — Temp 98.5°F

## 2019-01-27 DIAGNOSIS — D689 Coagulation defect, unspecified: Secondary | ICD-10-CM | POA: Diagnosis not present

## 2019-01-27 DIAGNOSIS — B351 Tinea unguium: Secondary | ICD-10-CM | POA: Diagnosis not present

## 2019-01-27 DIAGNOSIS — M79676 Pain in unspecified toe(s): Secondary | ICD-10-CM

## 2019-01-27 NOTE — Progress Notes (Signed)
Complaint:  Visit Type: Patient returns to my office for continued preventative foot care services. Complaint: Patient states" my nails have grown long and thick and become painful to walk and wear shoes"  The patient presents for preventative foot care services. No changes to ROS.  Patient is taking xarelto.  Podiatric Exam: Vascular: dorsalis pedis and posterior tibial pulses are palpable bilateral. Capillary return is immediate. Temperature gradient is WNL. Skin turgor WNL  Sensorium: Normal Semmes Weinstein monofilament test. Normal tactile sensation bilaterally. Nail Exam: Pt has thick disfigured discolored nails with subungual debris noted bilateral entire nail hallux through fifth toenails.  Subungual ulceration due to fungus toenail. Ulcer Exam: There is no evidence of ulcer or pre-ulcerative changes or infection. Orthopedic Exam: Muscle tone and strength are WNL. No limitations in general ROM. No crepitus or effusions noted. Foot type and digits show no abnormalities. Bony prominences are unremarkable. Skin: No Porokeratosis. No infection or ulcers  Diagnosis:  Onychomycosis, , Pain in right toe, pain in left toes,  Treatment & Plan Procedures and Treatment: Consent by patient was obtained for treatment procedures.   Debridement of mycotic and hypertrophic toenails, 1 through 5 bilateral and clearing of subungual debris. no infection noted.     Return Visit-Office Procedure: Patient instructed to return to the office for a follow up visit 3 months for continued evaluation and treatment.    Gardiner Barefoot DPM

## 2019-01-28 ENCOUNTER — Ambulatory Visit (INDEPENDENT_AMBULATORY_CARE_PROVIDER_SITE_OTHER): Payer: Medicare Other | Admitting: Vascular Surgery

## 2019-01-28 VITALS — BP 151/78 | HR 79 | Resp 16 | Ht 70.0 in | Wt 292.0 lb

## 2019-01-28 DIAGNOSIS — L97319 Non-pressure chronic ulcer of right ankle with unspecified severity: Secondary | ICD-10-CM

## 2019-01-28 DIAGNOSIS — I890 Lymphedema, not elsewhere classified: Secondary | ICD-10-CM | POA: Diagnosis not present

## 2019-01-28 DIAGNOSIS — I83013 Varicose veins of right lower extremity with ulcer of ankle: Secondary | ICD-10-CM

## 2019-01-28 NOTE — Progress Notes (Signed)
History of Present Illness  There is no documented history at this time  Assessments & Plan   There are no diagnoses linked to this encounter.    Additional instructions  Subjective:  Patient presents with venous ulcer of Bilateral lower extremities.    Procedure:  3 layer unna wrap was placed on Bilateral lower extremities.   Plan:   Follow up in one week. 

## 2019-02-04 ENCOUNTER — Encounter (INDEPENDENT_AMBULATORY_CARE_PROVIDER_SITE_OTHER): Payer: Self-pay

## 2019-02-04 ENCOUNTER — Other Ambulatory Visit: Payer: Self-pay

## 2019-02-04 ENCOUNTER — Ambulatory Visit (INDEPENDENT_AMBULATORY_CARE_PROVIDER_SITE_OTHER): Payer: Medicare Other | Admitting: Nurse Practitioner

## 2019-02-04 VITALS — BP 104/71 | HR 69 | Resp 16

## 2019-02-04 DIAGNOSIS — L97319 Non-pressure chronic ulcer of right ankle with unspecified severity: Secondary | ICD-10-CM

## 2019-02-04 DIAGNOSIS — I83013 Varicose veins of right lower extremity with ulcer of ankle: Secondary | ICD-10-CM | POA: Diagnosis not present

## 2019-02-04 NOTE — Progress Notes (Signed)
History of Present Illness  There is no documented history at this time  Assessments & Plan   There are no diagnoses linked to this encounter.    Additional instructions  Subjective:  Patient presents with venous ulcer of the Bilateral lower extremity.    Procedure:  3 layer unna wrap was placed Bilateral lower extremity.   Plan:   Follow up in one week.  

## 2019-02-11 ENCOUNTER — Encounter (INDEPENDENT_AMBULATORY_CARE_PROVIDER_SITE_OTHER): Payer: Self-pay

## 2019-02-11 ENCOUNTER — Ambulatory Visit (INDEPENDENT_AMBULATORY_CARE_PROVIDER_SITE_OTHER): Payer: Medicare Other | Admitting: Nurse Practitioner

## 2019-02-11 ENCOUNTER — Other Ambulatory Visit: Payer: Self-pay

## 2019-02-11 VITALS — BP 125/75 | HR 69 | Resp 16 | Ht 70.0 in | Wt 296.0 lb

## 2019-02-11 DIAGNOSIS — I83013 Varicose veins of right lower extremity with ulcer of ankle: Secondary | ICD-10-CM | POA: Diagnosis not present

## 2019-02-11 DIAGNOSIS — L97319 Non-pressure chronic ulcer of right ankle with unspecified severity: Secondary | ICD-10-CM | POA: Diagnosis not present

## 2019-02-11 NOTE — Progress Notes (Signed)
History of Present Illness  There is no documented history at this time  Assessments & Plan   There are no diagnoses linked to this encounter.    Additional instructions  Subjective:  Patient presents with venous ulcer of the Bilateral lower extremity.    Procedure:  3 layer unna wrap was placed Bilateral lower extremity.   Plan:   Follow up in one week.  

## 2019-02-18 ENCOUNTER — Ambulatory Visit (INDEPENDENT_AMBULATORY_CARE_PROVIDER_SITE_OTHER): Payer: Medicare Other | Admitting: Nurse Practitioner

## 2019-02-18 ENCOUNTER — Encounter (INDEPENDENT_AMBULATORY_CARE_PROVIDER_SITE_OTHER): Payer: Self-pay | Admitting: Nurse Practitioner

## 2019-02-18 ENCOUNTER — Other Ambulatory Visit: Payer: Self-pay

## 2019-02-18 VITALS — BP 115/63 | HR 80 | Resp 16 | Ht 70.0 in | Wt 298.0 lb

## 2019-02-18 DIAGNOSIS — L97319 Non-pressure chronic ulcer of right ankle with unspecified severity: Secondary | ICD-10-CM | POA: Diagnosis not present

## 2019-02-18 DIAGNOSIS — E1122 Type 2 diabetes mellitus with diabetic chronic kidney disease: Secondary | ICD-10-CM | POA: Diagnosis not present

## 2019-02-18 DIAGNOSIS — N183 Chronic kidney disease, stage 3 unspecified: Secondary | ICD-10-CM

## 2019-02-18 DIAGNOSIS — I129 Hypertensive chronic kidney disease with stage 1 through stage 4 chronic kidney disease, or unspecified chronic kidney disease: Secondary | ICD-10-CM

## 2019-02-18 DIAGNOSIS — I83013 Varicose veins of right lower extremity with ulcer of ankle: Secondary | ICD-10-CM | POA: Diagnosis not present

## 2019-02-18 DIAGNOSIS — N184 Chronic kidney disease, stage 4 (severe): Secondary | ICD-10-CM

## 2019-02-20 ENCOUNTER — Encounter (INDEPENDENT_AMBULATORY_CARE_PROVIDER_SITE_OTHER): Payer: Self-pay | Admitting: Nurse Practitioner

## 2019-02-20 NOTE — Progress Notes (Signed)
SUBJECTIVE:  [name], male    DOB: 11/24/1940, 78 y.o.   MRN: [id number] Chief Complaint  Patient presents with  . Follow-up    unna check    HPI  [facility]. is a 78 y.o. male Patient is seen for follow up evaluation of leg pain and swelling associated with venous ulceration. The patient was recently seen here and started on Unna boot therapy.  The swelling abruptly became much worse bilaterally and is associated with pain and discoloration. The pain and swelling worsens with prolonged dependency and improves with elevation.  The patient notes that in the morning the legs are better but the leg symptoms worsened throughout the course of the day. The patient has also noted a progressive worsening of the discoloration in the ankle and shin area.   The patient notes that an ulcer has developed acutely without specific trauma and since it occurred it has been very slow to heal.  There is a moderate amount of drainage associated with the open area.  The wound is also very painful.   The patient states that they have been elevating as much as possible. The patient denies any recent changes in medications.  The patient denies a history of DVT or PE. There is no prior history of phlebitis. There is no history of primary lymphedema.  No SOB or increased cough.  No sputum production.  No recent episodes of CHF exacerbation.   Past Medical History:  Diagnosis Date  . Arthritis   . Bilateral swelling of feet   . BPH (benign prostatic hyperplasia)   . DVT (deep venous thrombosis) (HCC)    right lower extremity  . History of gout   . Hypertension   . Tendonitis of foot    left    Past Surgical History:  Procedure Laterality Date  . APPENDECTOMY    . HERNIA REPAIR      Social History   Socioeconomic History  . Marital status: Married    Spouse name: Not on file  . Number of children: Not on file  . Years of education: Not on file  . Highest education  level: Not on file  Occupational History  . Not on file  Social Needs  . Financial resource strain: Not on file  . Food insecurity    Worry: Not on file    Inability: Not on file  . Transportation needs    Medical: Not on file    Non-medical: Not on file  Tobacco Use  . Smoking status: Former Research scientist (life sciences)  . Smokeless tobacco: Never Used  . Tobacco comment: 25 years ago quit  Substance and Sexual Activity  . Alcohol use: No  . Drug use: No  . Sexual activity: Not on file  Lifestyle  . Physical activity    Days per week: Not on file    Minutes per session: Not on file  . Stress: Not on file  Relationships  . Social Herbalist on phone: Not on file    Gets together: Not on file    Attends religious service: Not on file    Active member of club or organization: Not on file    Attends meetings of clubs or organizations: Not on file    Relationship status: Not on file  . Intimate partner violence    Fear of current or ex partner: Not on file    Emotionally abused: Not on file    Physically abused:  Not on file    Forced sexual activity: Not on file  Other Topics Concern  . Not on file  Social History Narrative  . Not on file    Family History  Problem Relation Age of Onset  . Arthritis Mother   . Hypertension Mother     Allergies  Allergen Reactions  . Shellfish Allergy Other (See Comments)    Unknown      Review of Systems   Review of Systems: Negative Unless Checked Constitutional: [] Weight loss  [] Fever  [] Chills Cardiac: [] Chest pain   []  Atrial Fibrillation  [] Palpitations   [] Shortness of breath when laying flat   [] Shortness of breath with exertion. [] Shortness of breath at rest Vascular:  [] Pain in legs with walking   [] Pain in legs with standing [] Pain in legs when laying flat   [] Claudication    [] Pain in feet when laying flat    [] History of DVT   [] Phlebitis   [x] Swelling in legs   [] Varicose veins   [] Non-healing ulcers Pulmonary:   [] Uses home  oxygen   [] Productive cough   [] Hemoptysis   [] Wheeze  [] COPD   [] Asthma Neurologic:  [] Dizziness   [] Seizures  [] Blackouts [] History of stroke   [] History of TIA  [] Aphasia   [] Temporary Blindness   [] Weakness or numbness in arm   [x] Weakness or numbness in leg Musculoskeletal:   [] Joint swelling   [] Joint pain   [] Low back pain  []  History of Knee Replacement [] Arthritis [] back Surgeries  []  Spinal Stenosis    Hematologic:  [] Easy bruising  [] Easy bleeding   [] Hypercoagulable state   [] Anemic Gastrointestinal:  [] Diarrhea   [] Vomiting  [] Gastroesophageal reflux/heartburn   [] Difficulty swallowing. [] Abdominal pain Genitourinary:  [x] Chronic kidney disease   [] Difficult urination  [] Anuric   [] Blood in urine [] Frequent urination  [] Burning with urination   [] Hematuria Skin:  [] Rashes   [x] Ulcers [] Wounds Psychological:  [] History of anxiety   []  History of major depression  []  Memory Difficulties      OBJECTIVE:   Physical Exam  BP 115/63 (BP Location: Right Arm)   Pulse 80   Resp 16   Ht 5\' 10"  (1.778 m)   Wt 298 lb (135.2 kg)   BMI 42.76 kg/m   Gen: WD/WN, NAD Head: [place]/AT, No temporalis wasting.  Ear/Nose/Throat: Hearing grossly intact, nares w/o erythema or drainage Eyes: PER, EOMI, sclera nonicteric.  Neck: Supple, no masses.  No JVD.  Pulmonary:  Good air movement, no use of accessory muscles.  Cardiac: RRR Vascular: 2-3+ edema of the right leg with severe venous changes of the right leg.  Venous ulcer noted in the ankle area on the right, noninfected  Venous stasis dermatitis with ulcers present on the right Vessel Right Left  Radial Palpable Palpable   Gastrointestinal: soft, non-distended. No guarding/no peritoneal signs.  Musculoskeletal: M/S 5/5 throughout.  No deformity or atrophy.  Neurologic: Pain and light touch intact in extremities.  Symmetrical.  Speech is fluent. Motor exam as listed above. Psychiatric: Judgment intact, Mood & affect appropriate for pt's  clinical situation. Dermatologic: No Venous rashes. No Ulcers Noted.  No changes consistent with cellulitis. Lymph : No Cervical lymphadenopathy, no lichenification or skin changes of chronic lymphedema.       ASSESSMENT AND PLAN:  1. Venous ulcer of ankle, right (HCC) No surgery or intervention at this point in time.    I have had a long discussion with the patient regarding venous insufficiency and why it  causes symptoms,  specifically venous ulceration . I have discussed with the patient the chronic skin changes that accompany venous insufficiency and the long term sequela such as infection and recurring  ulceration.  Patient will be placed in [employer] which will be changed weekly drainage permitting.  In addition, behavioral modification including several periods of elevation of the lower extremities during the day will be continued. Achieving a position with the ankles at heart level was stressed to the patient  The patient is instructed to begin routine exercise, especially walking on a daily basis  The patient will follow in 4 weeks for wound evaluation.   2. Type 2 diabetes mellitus with stage 4 chronic kidney disease, without long-term current use of insulin (HCC) Continue hypoglycemic medications as already ordered, these medications have been reviewed and there are no changes at this time.  Hgb A1C to be monitored as already arranged by primary service   3. Benign hypertension with chronic kidney disease, stage III (HCC) Continue antihypertensive medications as already ordered, these medications have been reviewed and there are no changes at this time.   Current Outpatient Medications on File Prior to Visit  Medication Sig Dispense Refill  . allopurinol (ZYLOPRIM) 100 MG tablet Take 100 mg by mouth daily.    Marland Kitchen aspirin 81 MG tablet Take 81 mg by mouth daily.    Marland Kitchen diltiazem (CARDIZEM LA) 240 MG 24 hr tablet Take 240 mg by mouth daily.    Marland Kitchen docusate sodium (COLACE) 100 MG  capsule Take 100 mg by mouth daily.    . fexofenadine (ALLEGRA) 180 MG tablet Take 180 mg by mouth daily as needed.    . fluticasone (FLONASE) 50 MCG/ACT nasal spray Place into the nose.    . furosemide (LASIX) 40 MG tablet     . losartan (COZAAR) 100 MG tablet Take 50 mg by mouth daily.     . metoprolol succinate (TOPROL-XL) 50 MG 24 hr tablet     . Multiple Vitamin (ONE-A-DAY MENS PO) Take by mouth daily.    Marland Kitchen nystatin (MYCOSTATIN/NYSTOP) powder Apply topically.    Marland Kitchen olmesartan-hydrochlorothiazide (BENICAR HCT) 40-12.5 MG tablet TAKE 1 TABLET BY MOUTH ONCE DAILY    . Rivaroxaban (XARELTO) 15 MG TABS tablet Take 20 mg by mouth.     . torsemide (DEMADEX) 20 MG tablet     . traMADol (ULTRAM) 50 MG tablet Take 1 tablet (50 mg total) by mouth every 6 (six) hours as needed. (Patient not taking: Reported on 12/07/2018) 30 tablet 0   No current facility-administered medications on file prior to visit.     There are no Patient Instructions on file for this visit. No follow-ups on file.   Kris Hartmann, NP  This note was completed with Sales executive.  Any errors are purely unintentional.

## 2019-02-25 ENCOUNTER — Other Ambulatory Visit: Payer: Self-pay

## 2019-02-25 ENCOUNTER — Ambulatory Visit (INDEPENDENT_AMBULATORY_CARE_PROVIDER_SITE_OTHER): Payer: Medicare Other | Admitting: Nurse Practitioner

## 2019-02-25 ENCOUNTER — Encounter (INDEPENDENT_AMBULATORY_CARE_PROVIDER_SITE_OTHER): Payer: Self-pay | Admitting: Nurse Practitioner

## 2019-02-25 VITALS — BP 114/64 | HR 86 | Resp 10 | Wt 293.0 lb

## 2019-02-25 DIAGNOSIS — I83013 Varicose veins of right lower extremity with ulcer of ankle: Secondary | ICD-10-CM | POA: Diagnosis not present

## 2019-02-25 DIAGNOSIS — L97319 Non-pressure chronic ulcer of right ankle with unspecified severity: Secondary | ICD-10-CM

## 2019-02-25 NOTE — Progress Notes (Signed)
History of Present Illness  There is no documented history at this time  Assessments & Plan   There are no diagnoses linked to this encounter.    Additional instructions  Subjective:  Patient presents with venous ulcer of the Right lower extremity.    Procedure:  3 layer unna wrap was placed Right lower extremity.   Plan:   Follow up in one week.   

## 2019-03-04 ENCOUNTER — Encounter (INDEPENDENT_AMBULATORY_CARE_PROVIDER_SITE_OTHER): Payer: Self-pay

## 2019-03-04 ENCOUNTER — Ambulatory Visit (INDEPENDENT_AMBULATORY_CARE_PROVIDER_SITE_OTHER): Payer: Medicare Other | Admitting: Nurse Practitioner

## 2019-03-04 ENCOUNTER — Other Ambulatory Visit: Payer: Self-pay

## 2019-03-04 VITALS — BP 120/65 | HR 87 | Resp 16 | Wt 295.0 lb

## 2019-03-04 DIAGNOSIS — L97319 Non-pressure chronic ulcer of right ankle with unspecified severity: Secondary | ICD-10-CM

## 2019-03-04 DIAGNOSIS — I83013 Varicose veins of right lower extremity with ulcer of ankle: Secondary | ICD-10-CM

## 2019-03-04 NOTE — Progress Notes (Signed)
History of Present Illness  There is no documented history at this time  Assessments & Plan   There are no diagnoses linked to this encounter.    Additional instructions  Subjective:  Patient presents with venous ulcer of the Right lower extremity.    Procedure:  3 layer unna wrap was placed Right lower extremity.   Plan:   Follow up in one week.   

## 2019-03-07 ENCOUNTER — Other Ambulatory Visit: Payer: Self-pay

## 2019-03-07 ENCOUNTER — Ambulatory Visit (INDEPENDENT_AMBULATORY_CARE_PROVIDER_SITE_OTHER): Payer: Medicare Other | Admitting: Nurse Practitioner

## 2019-03-07 ENCOUNTER — Encounter (INDEPENDENT_AMBULATORY_CARE_PROVIDER_SITE_OTHER): Payer: Self-pay | Admitting: Nurse Practitioner

## 2019-03-07 VITALS — BP 127/62 | HR 83 | Resp 16 | Ht 70.0 in | Wt 296.0 lb

## 2019-03-07 DIAGNOSIS — L97319 Non-pressure chronic ulcer of right ankle with unspecified severity: Secondary | ICD-10-CM

## 2019-03-07 DIAGNOSIS — I83013 Varicose veins of right lower extremity with ulcer of ankle: Secondary | ICD-10-CM

## 2019-03-07 NOTE — Progress Notes (Signed)
..  History of Present Illness  There is no documented history at this time  Assessments & Plan   There are no diagnoses linked to this encounter.    Additional instructions  Subjective:  Patient presents with venous ulcer of the Right lower extremity.    Procedure:  3 layer unna wrap was placed Right lower extremity.   Plan:   Follow up in one week.   [place] unna wrap placed.

## 2019-03-11 ENCOUNTER — Encounter (INDEPENDENT_AMBULATORY_CARE_PROVIDER_SITE_OTHER): Payer: Self-pay

## 2019-03-11 ENCOUNTER — Ambulatory Visit (INDEPENDENT_AMBULATORY_CARE_PROVIDER_SITE_OTHER): Payer: Medicare Other | Admitting: Nurse Practitioner

## 2019-03-11 ENCOUNTER — Other Ambulatory Visit: Payer: Self-pay

## 2019-03-11 VITALS — BP 127/62 | HR 80 | Resp 18 | Wt 296.0 lb

## 2019-03-11 DIAGNOSIS — I83013 Varicose veins of right lower extremity with ulcer of ankle: Secondary | ICD-10-CM | POA: Diagnosis not present

## 2019-03-11 DIAGNOSIS — L97319 Non-pressure chronic ulcer of right ankle with unspecified severity: Secondary | ICD-10-CM

## 2019-03-11 NOTE — Progress Notes (Signed)
History of Present Illness  There is no documented history at this time  Assessments & Plan   There are no diagnoses linked to this encounter.    Additional instructions  Subjective:  Patient presents with venous ulcer of the Right lower extremity.    Procedure:  3 layer unna wrap was placed Right lower extremity.   Plan:   Follow up in one week.   

## 2019-03-14 ENCOUNTER — Encounter (INDEPENDENT_AMBULATORY_CARE_PROVIDER_SITE_OTHER): Payer: Self-pay | Admitting: Nurse Practitioner

## 2019-03-18 ENCOUNTER — Encounter (INDEPENDENT_AMBULATORY_CARE_PROVIDER_SITE_OTHER): Payer: Self-pay

## 2019-03-18 ENCOUNTER — Ambulatory Visit (INDEPENDENT_AMBULATORY_CARE_PROVIDER_SITE_OTHER): Payer: Medicare Other | Admitting: Nurse Practitioner

## 2019-03-18 ENCOUNTER — Other Ambulatory Visit: Payer: Self-pay

## 2019-03-18 VITALS — BP 152/70 | HR 84 | Resp 16 | Wt 295.0 lb

## 2019-03-18 DIAGNOSIS — I83013 Varicose veins of right lower extremity with ulcer of ankle: Secondary | ICD-10-CM

## 2019-03-18 DIAGNOSIS — L97319 Non-pressure chronic ulcer of right ankle with unspecified severity: Secondary | ICD-10-CM | POA: Diagnosis not present

## 2019-03-18 NOTE — Progress Notes (Signed)
History of Present Illness  There is no documented history at this time  Assessments & Plan   There are no diagnoses linked to this encounter.    Additional instructions  Subjective:  Patient presents with venous ulcer of the Right lower extremity.    Procedure:  3 layer unna wrap was placed Right lower extremity.   Plan:   Follow up in one week.   

## 2019-03-25 ENCOUNTER — Encounter (INDEPENDENT_AMBULATORY_CARE_PROVIDER_SITE_OTHER): Payer: Self-pay | Admitting: Nurse Practitioner

## 2019-03-25 ENCOUNTER — Other Ambulatory Visit: Payer: Self-pay

## 2019-03-25 ENCOUNTER — Ambulatory Visit (INDEPENDENT_AMBULATORY_CARE_PROVIDER_SITE_OTHER): Payer: Medicare Other | Admitting: Nurse Practitioner

## 2019-03-25 VITALS — BP 115/71 | HR 76 | Resp 19 | Ht 70.0 in | Wt 292.0 lb

## 2019-03-25 DIAGNOSIS — N184 Chronic kidney disease, stage 4 (severe): Secondary | ICD-10-CM

## 2019-03-25 DIAGNOSIS — I129 Hypertensive chronic kidney disease with stage 1 through stage 4 chronic kidney disease, or unspecified chronic kidney disease: Secondary | ICD-10-CM

## 2019-03-25 DIAGNOSIS — L97319 Non-pressure chronic ulcer of right ankle with unspecified severity: Secondary | ICD-10-CM

## 2019-03-25 DIAGNOSIS — E1122 Type 2 diabetes mellitus with diabetic chronic kidney disease: Secondary | ICD-10-CM

## 2019-03-25 DIAGNOSIS — I83013 Varicose veins of right lower extremity with ulcer of ankle: Secondary | ICD-10-CM | POA: Diagnosis not present

## 2019-03-25 DIAGNOSIS — N183 Chronic kidney disease, stage 3 unspecified: Secondary | ICD-10-CM

## 2019-03-28 ENCOUNTER — Ambulatory Visit (INDEPENDENT_AMBULATORY_CARE_PROVIDER_SITE_OTHER): Payer: Medicare Other | Admitting: Nurse Practitioner

## 2019-03-28 ENCOUNTER — Other Ambulatory Visit: Payer: Self-pay

## 2019-03-28 ENCOUNTER — Encounter (INDEPENDENT_AMBULATORY_CARE_PROVIDER_SITE_OTHER): Payer: Self-pay

## 2019-03-28 VITALS — BP 105/63 | HR 79 | Resp 16

## 2019-03-28 DIAGNOSIS — I83013 Varicose veins of right lower extremity with ulcer of ankle: Secondary | ICD-10-CM

## 2019-03-28 DIAGNOSIS — L97319 Non-pressure chronic ulcer of right ankle with unspecified severity: Secondary | ICD-10-CM | POA: Diagnosis not present

## 2019-03-28 NOTE — Progress Notes (Signed)
History of Present Illness  There is no documented history at this time  Assessments & Plan   There are no diagnoses linked to this encounter.    Additional instructions  Subjective:  Patient presents with venous ulcer of the Right lower extremity.    Procedure:  3 layer unna wrap was placed Right lower extremity.   Plan:   Follow up in one week.   

## 2019-03-29 ENCOUNTER — Encounter (INDEPENDENT_AMBULATORY_CARE_PROVIDER_SITE_OTHER): Payer: Self-pay | Admitting: Nurse Practitioner

## 2019-03-29 NOTE — Progress Notes (Signed)
SUBJECTIVE:  Patient ID: Justin Shannon., male    DOB: August 13, 1940, 78 y.o.   MRN: [id number] Chief Complaint  Patient presents with  . Follow-up    Unna check    HPI  Justin Shannon. is a 78 y.o. male that presents today for unna wrap evaluation.  The patient has tolerated the wraps well.  The patient has some persistent ulceration on his right lower extremity however it does appear to be improving.  Patient is also using his lymphedema pump as well as elevating his lower extremities.  He denies any fever, chills, nausea, vomiting or diarrhea.  Past Medical History:  Diagnosis Date  . Arthritis   . Bilateral swelling of feet   . BPH (benign prostatic hyperplasia)   . DVT (deep venous thrombosis) (HCC)    right lower extremity  . History of gout   . Hypertension   . Tendonitis of foot    left    Past Surgical History:  Procedure Laterality Date  . APPENDECTOMY    . HERNIA REPAIR      Social History   Socioeconomic History  . Marital status: Married    Spouse name: Not on file  . Number of children: Not on file  . Years of education: Not on file  . Highest education level: Not on file  Occupational History  . Not on file  Social Needs  . Financial resource strain: Not on file  . Food insecurity    Worry: Not on file    Inability: Not on file  . Transportation needs    Medical: Not on file    Non-medical: Not on file  Tobacco Use  . Smoking status: Former Research scientist (life sciences)  . Smokeless tobacco: Never Used  . Tobacco comment: 25 years ago quit  Substance and Sexual Activity  . Alcohol use: No  . Drug use: No  . Sexual activity: Not on file  Lifestyle  . Physical activity    Days per week: Not on file    Minutes per session: Not on file  . Stress: Not on file  Relationships  . Social Herbalist on phone: Not on file    Gets together: Not on file    Attends religious service: Not on file    Active member of club or organization: Not on file   Attends meetings of clubs or organizations: Not on file    Relationship status: Not on file  . Intimate partner violence    Fear of current or ex partner: Not on file    Emotionally abused: Not on file    Physically abused: Not on file    Forced sexual activity: Not on file  Other Topics Concern  . Not on file  Social History Narrative  . Not on file    Family History  Problem Relation Age of Onset  . Arthritis Mother   . Hypertension Mother     Allergies  Allergen Reactions  . Shellfish Allergy Other (See Comments)    Unknown      Review of Systems   Review of Systems: Negative Unless Checked Constitutional: [] Weight loss  [] Fever  [] Chills Cardiac: [] Chest pain   []  Atrial Fibrillation  [] Palpitations   [] Shortness of breath when laying flat   [] Shortness of breath with exertion. [] Shortness of breath at rest Vascular:  [] Pain in legs with walking   [] Pain in legs with standing [] Pain in legs when laying flat   [] Claudication    []   Pain in feet when laying flat    [] History of DVT   [] Phlebitis   [x] Swelling in legs   [] Varicose veins   [x] Non-healing ulcers Pulmonary:   [] Uses home oxygen   [] Productive cough   [] Hemoptysis   [] Wheeze  [] COPD   [] Asthma Neurologic:  [] Dizziness   [] Seizures  [] Blackouts [] History of stroke   [] History of TIA  [] Aphasia   [] Temporary Blindness   [] Weakness or numbness in arm   [] Weakness or numbness in leg Musculoskeletal:   [] Joint swelling   [] Joint pain   [x] Low back pain  []  History of Knee Replacement [x] Arthritis [] back Surgeries  []  Spinal Stenosis    Hematologic:  [] Easy bruising  [] Easy bleeding   [] Hypercoagulable state   [] Anemic Gastrointestinal:  [] Diarrhea   [] Vomiting  [] Gastroesophageal reflux/heartburn   [] Difficulty swallowing. [] Abdominal pain Genitourinary:  [] Chronic kidney disease   [] Difficult urination  [] Anuric   [] Blood in urine [] Frequent urination  [] Burning with urination   [] Hematuria Skin:  [] Rashes   [] Ulcers  [] Wounds Psychological:  [] History of anxiety   []  History of major depression  []  Memory Difficulties      OBJECTIVE:   Physical Exam  BP 115/71 (BP Location: Left Arm)   Pulse 76   Resp 19   Ht 5\' 10"  (1.778 m)   Wt 292 lb (132.5 kg)   BMI 41.90 kg/m   Gen: WD/WN, NAD Head: [place]/AT, No temporalis wasting.  Ear/Nose/Throat: Hearing grossly intact, nares w/o erythema or drainage Eyes: PER, EOMI, sclera nonicteric.  Neck: Supple, no masses.  No JVD.  Pulmonary:  Good air movement, no use of accessory muscles.  Cardiac: RRR Vascular: 2+ edema bilaterally with small ulcerations on right leg  Vessel Right Left  Radial Palpable Palpable  Dorsalis Pedis Palpable Palpable  Posterior Tibial Palpable Palpable   Gastrointestinal: soft, non-distended. No guarding/no peritoneal signs.  Musculoskeletal: Uses cane for ambulation.  No deformity or atrophy.  Neurologic: Pain and light touch intact in extremities.  Symmetrical.  Speech is fluent. Motor exam as listed above. Psychiatric: Judgment intact, Mood & affect appropriate for pt's clinical situation. Dermatologic: No Venous rashes. No Ulcers Noted.  No changes consistent with cellulitis. Lymph : No Cervical lymphadenopathy, lichenification right lower extremity       ASSESSMENT AND PLAN:  1. Venous ulcer of ankle, right (HCC) No surgery or intervention at this point in time.    I have had a long discussion with the patient regarding venous insufficiency and why it  causes symptoms, specifically venous ulceration . I have discussed with the patient the chronic skin changes that accompany venous insufficiency and the long term sequela such as infection and recurring  ulceration.  Patient will be placed in [employer] which will be changed weekly drainage permitting.  In addition, behavioral modification including several periods of elevation of the lower extremities during the day will be continued. Achieving a position with the ankles  at heart level was stressed to the patient  The patient is instructed to begin routine exercise, especially walking on a daily basis  Patient will return in 4 weeks for evaluation.      2. Benign hypertension with chronic kidney disease, stage III (HCC) Continue antihypertensive medications as already ordered, these medications have been reviewed and there are no changes at this time.   3. Type 2 diabetes mellitus with stage 4 chronic kidney disease, without long-term current use of insulin (HCC) Continue hypoglycemic medications as already ordered, these medications have been  reviewed and there are no changes at this time.  Hgb A1C to be monitored as already arranged by primary service    Current Outpatient Medications on File Prior to Visit  Medication Sig Dispense Refill  . allopurinol (ZYLOPRIM) 100 MG tablet Take 100 mg by mouth daily.    Marland Kitchen diltiazem (CARDIZEM LA) 240 MG 24 hr tablet Take 240 mg by mouth daily.    Marland Kitchen docusate sodium (COLACE) 100 MG capsule Take 100 mg by mouth daily.    . fexofenadine (ALLEGRA) 180 MG tablet Take 180 mg by mouth daily as needed.    . fluticasone (FLONASE) 50 MCG/ACT nasal spray Place into the nose.    . furosemide (LASIX) 40 MG tablet     . levETIRAcetam (KEPPRA) 500 MG tablet     . losartan (COZAAR) 100 MG tablet Take 50 mg by mouth daily.     . metoprolol succinate (TOPROL-XL) 50 MG 24 hr tablet     . Multiple Vitamin (ONE-A-DAY MENS PO) Take by mouth daily.    Marland Kitchen nystatin (MYCOSTATIN/NYSTOP) powder Apply topically.    Marland Kitchen olmesartan-hydrochlorothiazide (BENICAR HCT) 40-12.5 MG tablet TAKE 1 TABLET BY MOUTH ONCE DAILY    . rivaroxaban (XARELTO) 20 MG TABS tablet Take by mouth.    . torsemide (DEMADEX) 20 MG tablet     . traMADol (ULTRAM) 50 MG tablet Take 1 tablet (50 mg total) by mouth every 6 (six) hours as needed. 30 tablet 0  . vitamin C (ASCORBIC ACID) 500 MG tablet Take by mouth.     No current facility-administered medications on file  prior to visit.     There are no Patient Instructions on file for this visit. No follow-ups on file.   Kris Hartmann, NP  This note was completed with Sales executive.  Any errors are purely unintentional.

## 2019-03-31 ENCOUNTER — Encounter (INDEPENDENT_AMBULATORY_CARE_PROVIDER_SITE_OTHER): Payer: Self-pay | Admitting: Nurse Practitioner

## 2019-04-01 ENCOUNTER — Ambulatory Visit (INDEPENDENT_AMBULATORY_CARE_PROVIDER_SITE_OTHER): Payer: Medicare Other | Admitting: Nurse Practitioner

## 2019-04-01 ENCOUNTER — Ambulatory Visit (INDEPENDENT_AMBULATORY_CARE_PROVIDER_SITE_OTHER): Payer: Medicare Other | Admitting: Vascular Surgery

## 2019-04-01 ENCOUNTER — Encounter (INDEPENDENT_AMBULATORY_CARE_PROVIDER_SITE_OTHER): Payer: Self-pay | Admitting: Nurse Practitioner

## 2019-04-01 ENCOUNTER — Other Ambulatory Visit: Payer: Self-pay

## 2019-04-01 VITALS — BP 107/64 | HR 86 | Resp 16 | Ht 70.0 in | Wt 292.0 lb

## 2019-04-01 DIAGNOSIS — I83013 Varicose veins of right lower extremity with ulcer of ankle: Secondary | ICD-10-CM | POA: Diagnosis not present

## 2019-04-01 DIAGNOSIS — L97319 Non-pressure chronic ulcer of right ankle with unspecified severity: Secondary | ICD-10-CM | POA: Diagnosis not present

## 2019-04-01 NOTE — Progress Notes (Signed)
History of Present Illness  There is no documented history at this time  Assessments & Plan   There are no diagnoses linked to this encounter.    Additional instructions  Subjective:  Patient presents with venous ulcer of the Right lower extremity.    Procedure:  3 layer unna wrap was placed Right lower extremity.   Plan:   Follow up in one week.   

## 2019-04-03 DIAGNOSIS — M1711 Unilateral primary osteoarthritis, right knee: Secondary | ICD-10-CM | POA: Insufficient documentation

## 2019-04-08 ENCOUNTER — Encounter (INDEPENDENT_AMBULATORY_CARE_PROVIDER_SITE_OTHER): Payer: Self-pay | Admitting: Nurse Practitioner

## 2019-04-08 ENCOUNTER — Other Ambulatory Visit: Payer: Self-pay

## 2019-04-08 ENCOUNTER — Ambulatory Visit (INDEPENDENT_AMBULATORY_CARE_PROVIDER_SITE_OTHER): Payer: Medicare Other | Admitting: Nurse Practitioner

## 2019-04-08 VITALS — BP 137/70 | HR 73 | Resp 17 | Ht 70.0 in | Wt 289.0 lb

## 2019-04-08 DIAGNOSIS — L97319 Non-pressure chronic ulcer of right ankle with unspecified severity: Secondary | ICD-10-CM | POA: Diagnosis not present

## 2019-04-08 DIAGNOSIS — I83013 Varicose veins of right lower extremity with ulcer of ankle: Secondary | ICD-10-CM

## 2019-04-08 NOTE — Progress Notes (Signed)
History of Present Illness  There is no documented history at this time  Assessments & Plan   There are no diagnoses linked to this encounter.    Additional instructions  Subjective:  Patient presents with venous ulcer of the Right lower extremity.    Procedure:  3 layer unna wrap was placed Right lower extremity.   Plan:   Follow up in one week.   

## 2019-04-15 ENCOUNTER — Ambulatory Visit (INDEPENDENT_AMBULATORY_CARE_PROVIDER_SITE_OTHER): Payer: Medicare Other | Admitting: Nurse Practitioner

## 2019-04-15 ENCOUNTER — Encounter (INDEPENDENT_AMBULATORY_CARE_PROVIDER_SITE_OTHER): Payer: Self-pay

## 2019-04-15 ENCOUNTER — Other Ambulatory Visit: Payer: Self-pay

## 2019-04-15 VITALS — BP 121/72 | HR 74 | Resp 16 | Wt 293.0 lb

## 2019-04-15 DIAGNOSIS — I83013 Varicose veins of right lower extremity with ulcer of ankle: Secondary | ICD-10-CM

## 2019-04-15 DIAGNOSIS — L97319 Non-pressure chronic ulcer of right ankle with unspecified severity: Secondary | ICD-10-CM

## 2019-04-15 NOTE — Progress Notes (Signed)
History of Present Illness  There is no documented history at this time  Assessments & Plan   There are no diagnoses linked to this encounter.    Additional instructions  Subjective:  Patient presents with venous ulcer of the Right lower extremity.    Procedure:  3 layer unna wrap was placed Right lower extremity.   Plan:   Follow up in one week.   

## 2019-04-18 ENCOUNTER — Ambulatory Visit (INDEPENDENT_AMBULATORY_CARE_PROVIDER_SITE_OTHER): Payer: Medicare Other | Admitting: Vascular Surgery

## 2019-04-18 ENCOUNTER — Encounter (INDEPENDENT_AMBULATORY_CARE_PROVIDER_SITE_OTHER): Payer: Self-pay | Admitting: Nurse Practitioner

## 2019-04-22 ENCOUNTER — Ambulatory Visit (INDEPENDENT_AMBULATORY_CARE_PROVIDER_SITE_OTHER): Payer: Medicare Other | Admitting: Nurse Practitioner

## 2019-04-22 ENCOUNTER — Encounter (INDEPENDENT_AMBULATORY_CARE_PROVIDER_SITE_OTHER): Payer: Self-pay

## 2019-04-22 ENCOUNTER — Other Ambulatory Visit: Payer: Self-pay

## 2019-04-22 VITALS — BP 146/88 | HR 79 | Resp 16 | Wt 293.6 lb

## 2019-04-22 DIAGNOSIS — I83013 Varicose veins of right lower extremity with ulcer of ankle: Secondary | ICD-10-CM | POA: Diagnosis not present

## 2019-04-22 DIAGNOSIS — L97319 Non-pressure chronic ulcer of right ankle with unspecified severity: Secondary | ICD-10-CM | POA: Diagnosis not present

## 2019-04-22 NOTE — Progress Notes (Signed)
History of Present Illness  There is no documented history at this time  Assessments & Plan   There are no diagnoses linked to this encounter.    Additional instructions  Subjective:  Patient presents with venous ulcer of the Right lower extremity.    Procedure:  3 layer unna wrap was placed Right lower extremity.   Plan:   Follow up in one week.   

## 2019-04-28 ENCOUNTER — Encounter: Payer: Self-pay | Admitting: Podiatry

## 2019-04-28 ENCOUNTER — Ambulatory Visit: Payer: Medicare Other | Admitting: Podiatry

## 2019-04-28 ENCOUNTER — Other Ambulatory Visit: Payer: Self-pay

## 2019-04-28 DIAGNOSIS — M79676 Pain in unspecified toe(s): Secondary | ICD-10-CM | POA: Diagnosis not present

## 2019-04-28 DIAGNOSIS — D689 Coagulation defect, unspecified: Secondary | ICD-10-CM

## 2019-04-28 DIAGNOSIS — B351 Tinea unguium: Secondary | ICD-10-CM

## 2019-04-28 NOTE — Progress Notes (Signed)
Complaint:  Visit Type: Patient returns to my office for continued preventative foot care services. Complaint: Patient states" my nails have grown long and thick and become painful to walk and wear shoes"  The patient presents for preventative foot care services. No changes to ROS.  Patient is taking xarelto.  Podiatric Exam: Vascular: dorsalis pedis and posterior tibial pulses are palpable bilateral. Capillary return is immediate. Temperature gradient is WNL. Skin turgor WNL  Sensorium: Normal Semmes Weinstein monofilament test. Normal tactile sensation bilaterally. Nail Exam: Pt has thick disfigured discolored nails with subungual debris noted bilateral entire nail hallux through fifth toenails.  Subungual ulceration due to fungus toenail. Ulcer Exam: There is no evidence of ulcer or pre-ulcerative changes or infection. Orthopedic Exam: Muscle tone and strength are WNL. No limitations in general ROM. No crepitus or effusions noted. Foot type and digits show no abnormalities. Bony prominences are unremarkable. Skin: No Porokeratosis. No infection or ulcers  Diagnosis:  Onychomycosis, , Pain in right toe, pain in left toes,  Treatment & Plan Procedures and Treatment: Consent by patient was obtained for treatment procedures.   Debridement of mycotic and hypertrophic toenails, 1 through 5 bilateral and clearing of subungual debris. no infection noted.   Subungual drainage right hallux.  No infection.  Bandaid was applied.  Return Visit-Office Procedure: Patient instructed to return to the office for a follow up visit 3 months for continued evaluation and treatment.    Gardiner Barefoot DPM

## 2019-04-29 ENCOUNTER — Encounter (INDEPENDENT_AMBULATORY_CARE_PROVIDER_SITE_OTHER): Payer: Self-pay | Admitting: Nurse Practitioner

## 2019-04-29 ENCOUNTER — Ambulatory Visit (INDEPENDENT_AMBULATORY_CARE_PROVIDER_SITE_OTHER): Payer: Medicare Other | Admitting: Nurse Practitioner

## 2019-04-29 VITALS — BP 127/72 | HR 67 | Resp 20 | Ht 70.0 in | Wt 292.0 lb

## 2019-04-29 DIAGNOSIS — I890 Lymphedema, not elsewhere classified: Secondary | ICD-10-CM

## 2019-04-29 DIAGNOSIS — L97319 Non-pressure chronic ulcer of right ankle with unspecified severity: Secondary | ICD-10-CM

## 2019-04-29 DIAGNOSIS — N184 Chronic kidney disease, stage 4 (severe): Secondary | ICD-10-CM

## 2019-04-29 DIAGNOSIS — I83013 Varicose veins of right lower extremity with ulcer of ankle: Secondary | ICD-10-CM | POA: Diagnosis not present

## 2019-04-29 DIAGNOSIS — E1122 Type 2 diabetes mellitus with diabetic chronic kidney disease: Secondary | ICD-10-CM

## 2019-05-02 ENCOUNTER — Encounter (INDEPENDENT_AMBULATORY_CARE_PROVIDER_SITE_OTHER): Payer: Self-pay | Admitting: Nurse Practitioner

## 2019-05-02 NOTE — Progress Notes (Signed)
SUBJECTIVE:  [name], male    DOB: Jun 26, 1940, 78 y.o.   MRN: [id number] Chief Complaint  Patient presents with  . Follow-up    HPI  [facility] Justin Shannon. is a 78 y.o. male that presents today for evaluation of his right lower extremity after 6 weeks of Unna wraps.  The left lower extremity does appear much better however there is several small areas of ulceration one being behind the calf.  The patient also has extensive lichenified dry skin all over his left lower extremity.  The patient states that with family assistance he has been doing well with utilizing his compression pump.  Overall he is tolerating his Unna wraps well.  He denies any fever, chills, nausea, vomiting or diarrhea.  Past Medical History:  Diagnosis Date  . Arthritis   . Bilateral swelling of feet   . BPH (benign prostatic hyperplasia)   . DVT (deep venous thrombosis) (HCC)    right lower extremity  . History of gout   . Hypertension   . Tendonitis of foot    left    Past Surgical History:  Procedure Laterality Date  . APPENDECTOMY    . HERNIA REPAIR      Social History   Socioeconomic History  . Marital status: Married    Spouse name: Not on file  . Number of children: Not on file  . Years of education: Not on file  . Highest education level: Not on file  Occupational History  . Not on file  Social Needs  . Financial resource strain: Not on file  . Food insecurity    Worry: Not on file    Inability: Not on file  . Transportation needs    Medical: Not on file    Non-medical: Not on file  Tobacco Use  . Smoking status: Former Research scientist (life sciences)  . Smokeless tobacco: Never Used  . Tobacco comment: 25 years ago quit  Substance and Sexual Activity  . Alcohol use: No  . Drug use: No  . Sexual activity: Not on file  Lifestyle  . Physical activity    Days per week: Not on file    Minutes per session: Not on file  . Stress: Not on file  Relationships  . Social Herbalist  on phone: Not on file    Gets together: Not on file    Attends religious service: Not on file    Active member of club or organization: Not on file    Attends meetings of clubs or organizations: Not on file    Relationship status: Not on file  . Intimate partner violence    Fear of current or ex partner: Not on file    Emotionally abused: Not on file    Physically abused: Not on file    Forced sexual activity: Not on file  Other Topics Concern  . Not on file  Social History Narrative  . Not on file    Family History  Problem Relation Age of Onset  . Arthritis Mother   . Hypertension Mother     Allergies  Allergen Reactions  . Shellfish Allergy Other (See Comments) and Anaphylaxis    Unknown      Review of Systems   Review of Systems: Negative Unless Checked Constitutional: [] Weight loss  [] Fever  [] Chills Cardiac: [] Chest pain   []  Atrial Fibrillation  [] Palpitations   [] Shortness of breath when laying flat   [] Shortness of breath with exertion. []   Shortness of breath at rest Vascular:  [] Pain in legs with walking   [] Pain in legs with standing [] Pain in legs when laying flat   [] Claudication    [] Pain in feet when laying flat    [] History of DVT   [] Phlebitis   [x] Swelling in legs   [] Varicose veins   [] Non-healing ulcers Pulmonary:   [] Uses home oxygen   [] Productive cough   [] Hemoptysis   [] Wheeze  [] COPD   [] Asthma Neurologic:  [] Dizziness   [] Seizures  [] Blackouts [] History of stroke   [] History of TIA  [] Aphasia   [] Temporary Blindness   [] Weakness or numbness in arm   [] Weakness or numbness in leg Musculoskeletal:   [] Joint swelling   [] Joint pain   [] Low back pain  []  History of Knee Replacement [x] Arthritis [] back Surgeries  []  Spinal Stenosis    Hematologic:  [] Easy bruising  [] Easy bleeding   [] Hypercoagulable state   [x] Anemic Gastrointestinal:  [] Diarrhea   [] Vomiting  [] Gastroesophageal reflux/heartburn   [] Difficulty swallowing. [] Abdominal pain Genitourinary:   [x] Chronic kidney disease   [] Difficult urination  [] Anuric   [] Blood in urine [] Frequent urination  [] Burning with urination   [] Hematuria Skin:  [] Rashes   [x] Ulcers [] Wounds Psychological:  [] History of anxiety   []  History of major depression  []  Memory Difficulties      OBJECTIVE:   Physical Exam  BP 127/72 (BP Location: Left Arm)   Pulse 67   Resp 20   Ht 5\' 10"  (1.778 m)   Wt 292 lb (132.5 kg)   BMI 41.90 kg/m   Gen: WD/WN, NAD Head: [place]/AT, No temporalis wasting.  Ear/Nose/Throat: Hearing grossly intact, nares w/o erythema or drainage Eyes: PER, EOMI, sclera nonicteric.  Neck: Supple, no masses.  No JVD.  Pulmonary:  Good air movement, no use of accessory muscles.  Cardiac: RRR Vascular:  3+ edema right lower extremity with severe venous changes Vessel Right Left  Radial Palpable Palpable  Dorsalis Pedis Not Palpable Palpable  Posterior Tibial Not Palpable Palpable   Gastrointestinal: soft, non-distended. No guarding/no peritoneal signs.  Musculoskeletal: M/S 5/5 throughout.  No deformity or atrophy.  Neurologic: Pain and light touch intact in extremities.  Symmetrical.  Speech is fluent. Motor exam as listed above. Psychiatric: Judgment intact, Mood & affect appropriate for pt's clinical situation. Dermatologic: No Venous rashes. No Ulcers Noted.  No changes consistent with cellulitis. Lymph : No Cervical lymphadenopathy, lichenification of right lower extremity       ASSESSMENT AND PLAN:  1. Lymphedema Patient will continue to utilize compression socks on his right lower extremity as well as continue to utilize his lymphedema pump on a daily basis.  He should also endeavor to elevate his lower extremities as much as possible.  Today there was also a very light superficial debridement of the extensive dead skin.  This was done also for patient comfort.  2. Venous ulcer of ankle, right ([place]) Patient will continue to be in [place] wraps.  He will present to the office  on a weekly basis to have them changed.  We will reevaluate his progression in 6 weeks.  3. Type 2 diabetes mellitus with stage 4 chronic kidney disease, without long-term current use of insulin (HCC) Continue hypoglycemic medications as already ordered, these medications have been reviewed and there are no changes at this time.  Hgb A1C to be monitored as already arranged by primary service    Current Outpatient Medications on File Prior to Visit  Medication Sig Dispense Refill  .  allopurinol (ZYLOPRIM) 100 MG tablet Take 100 mg by mouth daily.    Marland Kitchen diltiazem (CARDIZEM LA) 240 MG 24 hr tablet Take 240 mg by mouth daily.    Marland Kitchen docusate sodium (COLACE) 100 MG capsule Take 100 mg by mouth daily.    . fexofenadine (ALLEGRA) 180 MG tablet Take 180 mg by mouth daily as needed.    . fluticasone (FLONASE) 50 MCG/ACT nasal spray Place into the nose.    . furosemide (LASIX) 40 MG tablet     . levETIRAcetam (KEPPRA) 500 MG tablet     . losartan (COZAAR) 100 MG tablet Take 50 mg by mouth daily.     . metoprolol succinate (TOPROL-XL) 50 MG 24 hr tablet     . Multiple Vitamin (ONE-A-DAY MENS PO) Take by mouth daily.    Marland Kitchen olmesartan-hydrochlorothiazide (BENICAR HCT) 40-12.5 MG tablet TAKE 1 TABLET BY MOUTH ONCE DAILY    . rivaroxaban (XARELTO) 20 MG TABS tablet Take by mouth.    . torsemide (DEMADEX) 20 MG tablet     . traMADol (ULTRAM) 50 MG tablet Take 1 tablet (50 mg total) by mouth every 6 (six) hours as needed. 30 tablet 0  . vitamin C (ASCORBIC ACID) 500 MG tablet Take by mouth.     No current facility-administered medications on file prior to visit.     There are no Patient Instructions on file for this visit. No follow-ups on file.   Kris Hartmann, NP  This note was completed with Sales executive.  Any errors are purely unintentional.

## 2019-05-05 ENCOUNTER — Encounter (INDEPENDENT_AMBULATORY_CARE_PROVIDER_SITE_OTHER): Payer: Self-pay

## 2019-05-05 ENCOUNTER — Ambulatory Visit (INDEPENDENT_AMBULATORY_CARE_PROVIDER_SITE_OTHER): Payer: Medicare Other | Admitting: Nurse Practitioner

## 2019-05-05 ENCOUNTER — Other Ambulatory Visit: Payer: Self-pay

## 2019-05-05 VITALS — BP 136/71 | HR 85 | Resp 16 | Wt 300.0 lb

## 2019-05-05 DIAGNOSIS — I83013 Varicose veins of right lower extremity with ulcer of ankle: Secondary | ICD-10-CM | POA: Diagnosis not present

## 2019-05-05 DIAGNOSIS — L97319 Non-pressure chronic ulcer of right ankle with unspecified severity: Secondary | ICD-10-CM | POA: Diagnosis not present

## 2019-05-05 NOTE — Progress Notes (Signed)
History of Present Illness  There is no documented history at this time  Assessments & Plan   There are no diagnoses linked to this encounter.    Additional instructions  Subjective:  Patient presents with venous ulcer of the Right lower extremity.    Procedure:  3 layer unna wrap was placed Right lower extremity.   Plan:   Follow up in one week.   

## 2019-05-13 ENCOUNTER — Encounter (INDEPENDENT_AMBULATORY_CARE_PROVIDER_SITE_OTHER): Payer: Self-pay

## 2019-05-13 ENCOUNTER — Ambulatory Visit (INDEPENDENT_AMBULATORY_CARE_PROVIDER_SITE_OTHER): Payer: Medicare Other | Admitting: Nurse Practitioner

## 2019-05-13 ENCOUNTER — Other Ambulatory Visit: Payer: Self-pay

## 2019-05-13 VITALS — BP 134/71 | HR 84 | Resp 16 | Ht 70.0 in | Wt 300.0 lb

## 2019-05-13 DIAGNOSIS — L97319 Non-pressure chronic ulcer of right ankle with unspecified severity: Secondary | ICD-10-CM | POA: Diagnosis not present

## 2019-05-13 DIAGNOSIS — I83013 Varicose veins of right lower extremity with ulcer of ankle: Secondary | ICD-10-CM | POA: Diagnosis not present

## 2019-05-13 NOTE — Progress Notes (Signed)
History of Present Illness  There is no documented history at this time  Assessments & Plan   There are no diagnoses linked to this encounter.    Additional instructions  Subjective:  Patient presents with venous ulcer of the Right lower extremity.    Procedure:  3 layer unna wrap was placed Right lower extremity.   Plan:   Follow up in one week.   

## 2019-05-18 ENCOUNTER — Ambulatory Visit (INDEPENDENT_AMBULATORY_CARE_PROVIDER_SITE_OTHER): Payer: Medicare Other | Admitting: Nurse Practitioner

## 2019-05-18 ENCOUNTER — Encounter (INDEPENDENT_AMBULATORY_CARE_PROVIDER_SITE_OTHER): Payer: Self-pay | Admitting: Nurse Practitioner

## 2019-05-18 ENCOUNTER — Other Ambulatory Visit: Payer: Self-pay

## 2019-05-18 VITALS — BP 145/81 | HR 71 | Resp 17 | Ht 70.0 in | Wt 301.0 lb

## 2019-05-18 DIAGNOSIS — L97319 Non-pressure chronic ulcer of right ankle with unspecified severity: Secondary | ICD-10-CM

## 2019-05-18 DIAGNOSIS — I83013 Varicose veins of right lower extremity with ulcer of ankle: Secondary | ICD-10-CM | POA: Diagnosis not present

## 2019-05-18 NOTE — Progress Notes (Signed)
History of Present Illness  There is no documented history at this time  Assessments & Plan   There are no diagnoses linked to this encounter.    Additional instructions  Subjective:  Patient presents with venous ulcer of the Right lower extremity.    Procedure:  3 layer unna wrap was placed Right lower extremity.   Plan:   Follow up in one week.   

## 2019-05-27 ENCOUNTER — Other Ambulatory Visit: Payer: Self-pay

## 2019-05-27 ENCOUNTER — Encounter: Payer: Medicare Other | Attending: Internal Medicine | Admitting: *Deleted

## 2019-05-27 ENCOUNTER — Ambulatory Visit (INDEPENDENT_AMBULATORY_CARE_PROVIDER_SITE_OTHER): Payer: Medicare Other | Admitting: Nurse Practitioner

## 2019-05-27 ENCOUNTER — Encounter (INDEPENDENT_AMBULATORY_CARE_PROVIDER_SITE_OTHER): Payer: Self-pay

## 2019-05-27 ENCOUNTER — Encounter: Payer: Self-pay | Admitting: *Deleted

## 2019-05-27 VITALS — BP 136/80 | Ht 70.0 in | Wt 295.8 lb

## 2019-05-27 VITALS — BP 122/66 | HR 88 | Resp 16 | Wt 299.0 lb

## 2019-05-27 DIAGNOSIS — N184 Chronic kidney disease, stage 4 (severe): Secondary | ICD-10-CM | POA: Diagnosis not present

## 2019-05-27 DIAGNOSIS — E1122 Type 2 diabetes mellitus with diabetic chronic kidney disease: Secondary | ICD-10-CM | POA: Insufficient documentation

## 2019-05-27 DIAGNOSIS — Z713 Dietary counseling and surveillance: Secondary | ICD-10-CM | POA: Insufficient documentation

## 2019-05-27 DIAGNOSIS — Z6841 Body Mass Index (BMI) 40.0 and over, adult: Secondary | ICD-10-CM | POA: Diagnosis not present

## 2019-05-27 DIAGNOSIS — I83013 Varicose veins of right lower extremity with ulcer of ankle: Secondary | ICD-10-CM

## 2019-05-27 DIAGNOSIS — L97319 Non-pressure chronic ulcer of right ankle with unspecified severity: Secondary | ICD-10-CM

## 2019-05-27 NOTE — Patient Instructions (Addendum)
Check blood sugars 2 x day before breakfast and 2 hrs after supper 3 x week Bring blood sugar records to appointments  Call your doctor for a prescription for:  1. Meter strips (type) One Touch Verio  checking  3 times per week  2. Lancets (type) One Touch Delica Plus checking  3 times per week  Exercise: Walk or do chair exercises as tolerated  Eat 3 meals day,  1-2  snacks a day Space meals 4-6 hours apart Limit fried foods (bacon, chicken) Avoid sugar sweetened drinks (soda, juices)  Return for appointment on:

## 2019-05-27 NOTE — Progress Notes (Signed)
History of Present Illness  There is no documented history at this time  Assessments & Plan   There are no diagnoses linked to this encounter.    Additional instructions  Subjective:  Patient presents with venous ulcer of the Bilateral lower extremity.    Procedure:  3 layer unna wrap was placed Bilateral lower extremity.   Plan:   Follow up in one week.  

## 2019-05-27 NOTE — Progress Notes (Signed)
Diabetes Self-Management Education  Visit Type: First/Initial  Appt. Start Time: 1115 Appt. End Time: 1250  05/27/2019  Mr. Justin Shannon, identified by name and date of birth, is a 78 y.o. male with a diagnosis of Diabetes: Type 2.   ASSESSMENT  Blood pressure 136/80, height 5\' 10"  (1.778 m), weight 295 lb 12.8 oz (134.2 kg). Body mass index is 42.44 kg/m.  Diabetes Self-Management Education - 05/27/19 1154      Visit Information   Visit Type  First/Initial      Initial Visit   Diabetes Type  Type 2    Are you currently following a meal plan?  No    Are you taking your medications as prescribed?  Yes    Date Diagnosed  "2 years without knowledge" -  A1C in Dec 2019 was 6.8 %      Health Coping   How would you rate your overall health?  Good      Psychosocial Assessment   Patient Belief/Attitude about Diabetes  Motivated to manage diabetes    Self-care barriers  Unsteady gait/risk for falls    Self-management support  Doctor's office;Family    Other persons present  Family Member   son; daughter and wife on the phone   Patient Concerns  Nutrition/Meal planning;Monitoring;Healthy Lifestyle;Weight Control    Special Needs  None    Preferred Learning Style  Hands on    Learning Readiness  Ready    How often do you need to have someone help you when you read instructions, pamphlets, or other written materials from your doctor or pharmacy?  3 - Sometimes    What is the last grade level you completed in school?  11th      Pre-Education Assessment   Patient understands the diabetes disease and treatment process.  Needs Instruction    Patient understands incorporating nutritional management into lifestyle.  Needs Instruction    Patient undertands incorporating physical activity into lifestyle.  Needs Instruction    Patient understands using medications safely.  Needs Instruction    Patient understands monitoring blood glucose, interpreting and using results  Needs Instruction     Patient understands prevention, detection, and treatment of acute complications.  Needs Instruction    Patient understands prevention, detection, and treatment of chronic complications.  Needs Instruction    Patient understands how to develop strategies to address psychosocial issues.  Needs Instruction    Patient understands how to develop strategies to promote health/change behavior.  Needs Instruction      Complications   Last HgB A1C per patient/outside source  6.7 %   04/26/2019   How often do you check your blood sugar?  0 times/day (not testing)   Provided One Touch Verio Flex meter and instructed on use. BG upon return demonstration was 100 mg/dL at 12:40 pm - 4 hrs pp.   Have you had a dilated eye exam in the past 12 months?  Yes    Have you had a dental exam in the past 12 months?  No    Are you checking your feet?  No      Dietary Intake   Breakfast  bacon, eggs, grits, oatmeal, toast    Snack (morning)  0-3 snacks/day - popcorn, nuts, occasional chips, cookies    Lunch  ham or [place] sandwich    Dinner  chicken, beef, pork, fish; potatoes, peas, beans, corn, little rice, salads, tomatoes, cuckes, green beans, greens    Beverage(s)  water, juice, regular ginger  ale      Exercise   Exercise Type  ADL's      Patient Education   Previous Diabetes Education  No    Disease state   Definition of diabetes, type 1 and 2, and the diagnosis of diabetes;Factors that contribute to the development of diabetes    Nutrition management   Role of diet in the treatment of diabetes and the relationship between the three main macronutrients and blood glucose level;Food label reading, portion sizes and measuring food.;Reviewed blood glucose goals for pre and post meals and how to evaluate the patients' food intake on their blood glucose level.    Physical activity and exercise   Helped patient identify appropriate exercises in relation to his/her diabetes, diabetes complications and other health  issue.    Monitoring  Taught/evaluated SMBG meter.;Purpose and frequency of SMBG.;Taught/discussed recording of test results and interpretation of SMBG.;Identified appropriate SMBG and/or A1C goals.    Chronic complications  Relationship between chronic complications and blood glucose control    Psychosocial adjustment  Identified and addressed patients feelings and concerns about diabetes      Individualized Goals (developed by patient)   Reducing Risk Prevent diabetes complications Lose weight Lead a healthier lifestyle     Outcomes   Expected Outcomes  Demonstrated interest in learning. Expect positive outcomes    Future DMSE  4-6 wks       Individualized Plan for Diabetes Self-Management Training:   Learning Objective:  Patient will have a greater understanding of diabetes self-management. Patient education plan is to attend individual and/or group sessions per assessed needs and concerns.   Plan:   Patient Instructions  Check blood sugars 2 x day before breakfast and 2 hrs after supper 3 x week Bring blood sugar records to appointments Call your doctor for a prescription for:  1. Meter strips (type) One Touch Verio  checking  3 times per week  2. Lancets (type) One Touch Delica Plus checking  3 times per week Exercise: Walk or do chair exercises as tolerated Eat 3 meals day,  1-2  snacks a day Space meals 4-6 hours apart Limit fried foods (bacon, chicken) Avoid sugar sweetened drinks (soda, juices)  Expected Outcomes:  Demonstrated interest in learning. Expect positive outcomes  Education material provided: General Meal Planning Guidelines Simple Meal Plan Meter = One Touch Verio Flex Exercise Guide (ADA)  If problems or questions, patient to contact team via:  Justin Shannon, [place], [place], CDE [phone]  Future DSME appointment: 4-6 wks  June 21, 2019 with the nurse

## 2019-06-03 ENCOUNTER — Encounter (INDEPENDENT_AMBULATORY_CARE_PROVIDER_SITE_OTHER): Payer: Self-pay | Admitting: Nurse Practitioner

## 2019-06-03 ENCOUNTER — Other Ambulatory Visit: Payer: Self-pay

## 2019-06-03 ENCOUNTER — Ambulatory Visit (INDEPENDENT_AMBULATORY_CARE_PROVIDER_SITE_OTHER): Payer: Medicare Other | Admitting: Nurse Practitioner

## 2019-06-03 VITALS — BP 119/65 | HR 75 | Resp 17 | Ht 70.0 in | Wt 294.0 lb

## 2019-06-03 DIAGNOSIS — I83013 Varicose veins of right lower extremity with ulcer of ankle: Secondary | ICD-10-CM

## 2019-06-03 DIAGNOSIS — M1711 Unilateral primary osteoarthritis, right knee: Secondary | ICD-10-CM | POA: Diagnosis not present

## 2019-06-03 DIAGNOSIS — L97319 Non-pressure chronic ulcer of right ankle with unspecified severity: Secondary | ICD-10-CM | POA: Diagnosis not present

## 2019-06-03 DIAGNOSIS — I129 Hypertensive chronic kidney disease with stage 1 through stage 4 chronic kidney disease, or unspecified chronic kidney disease: Secondary | ICD-10-CM | POA: Diagnosis not present

## 2019-06-03 DIAGNOSIS — N183 Chronic kidney disease, stage 3 unspecified: Secondary | ICD-10-CM

## 2019-06-09 ENCOUNTER — Encounter (INDEPENDENT_AMBULATORY_CARE_PROVIDER_SITE_OTHER): Payer: Self-pay | Admitting: Nurse Practitioner

## 2019-06-09 NOTE — Progress Notes (Signed)
SUBJECTIVE:  Patient ID: Justin Shannon., male    DOB: [birth year], 78 y.o.   MRN: [id number] Chief Complaint  Patient presents with  . Follow-up    unna check    HPI  [facility]. is a 78 y.o. male returns to the office for evaluation of lower extremity edema and ulceration.  The previous ulcerations on the right lower extremity are mostly healed however there are a few shallow spots.  The edema on the patient's left lower extremity is also doing better following [place] wraps.  The patient also states that the pain that he had in his right ankle area is also feeling better.  He continues to use his lymphedema pump on a daily basis.  Overall the patient is progressing well.  Past Medical History:  Diagnosis Date  . Arthritis   . Bilateral swelling of feet   . BPH (benign prostatic hyperplasia)   . Chronic kidney disease   . Diabetes mellitus without complication ([place])   . DVT (deep venous thrombosis) (HCC)    right lower extremity  . History of gout   . Hypertension   . Seizures ([place])   . Sleep apnea   . Tendonitis of foot    left    Past Surgical History:  Procedure Laterality Date  . APPENDECTOMY    . HERNIA REPAIR    . PROSTATE SURGERY      Social History   Socioeconomic History  . Marital status: Married    Spouse name: Not on file  . Number of children: Not on file  . Years of education: Not on file  . Highest education level: Not on file  Occupational History  . Not on file  Tobacco Use  . Smoking status: Former Smoker    Packs/day: 1.00    Years: 2.00    Pack years: 2.00    Types: Cigarettes    Quit date: 06/23/1968    Years since quitting: 50.9  . Smokeless tobacco: Never Used  . Tobacco comment: 25 years ago quit  Substance and Sexual Activity  . Alcohol use: No  . Drug use: No  . Sexual activity: Not on file  Other Topics Concern  . Not on file  Social History Narrative  . Not on file   Social Determinants of Health   Financial  Resource Strain:   . Difficulty of Paying Living Expenses: Not on file  Food Insecurity:   . Worried About Charity fundraiser in the Last Year: Not on file  . Ran Out of Food in the Last Year: Not on file  Transportation Needs:   . Lack of Transportation (Medical): Not on file  . Lack of Transportation (Non-Medical): Not on file  Physical Activity:   . Days of Exercise per Week: Not on file  . Minutes of Exercise per Session: Not on file  Stress:   . Feeling of Stress : Not on file  Social Connections:   . Frequency of Communication with Friends and Family: Not on file  . Frequency of Social Gatherings with Friends and Family: Not on file  . Attends Religious Services: Not on file  . Active Member of Clubs or Organizations: Not on file  . Attends Archivist Meetings: Not on file  . Marital Status: Not on file  Intimate Partner Violence:   . Fear of Current or Ex-Partner: Not on file  . Emotionally Abused: Not on file  . Physically Abused: Not on file  .  Sexually Abused: Not on file    Family History  Problem Relation Age of Onset  . Arthritis Mother   . Hypertension Mother   . Diabetes Brother     Allergies  Allergen Reactions  . Shellfish Allergy Other (See Comments) and Anaphylaxis    Unknown      Review of Systems   Review of Systems: Negative Unless Checked Constitutional: [] Weight loss  [] Fever  [] Chills Cardiac: [] Chest pain   []  Atrial Fibrillation  [] Palpitations   [] Shortness of breath when laying flat   [] Shortness of breath with exertion. [] Shortness of breath at rest Vascular:  [] Pain in legs with walking   [] Pain in legs with standing [] Pain in legs when laying flat   [] Claudication    [] Pain in feet when laying flat    [x] History of DVT   [] Phlebitis   [x] Swelling in legs   [] Varicose veins   [] Non-healing ulcers Pulmonary:   [] Uses home oxygen   [] Productive cough   [] Hemoptysis   [] Wheeze  [] COPD   [] Asthma Neurologic:  [] Dizziness    [x] Seizures  [] Blackouts [] History of stroke   [] History of TIA  [] Aphasia   [] Temporary Blindness   [] Weakness or numbness in arm   [] Weakness or numbness in leg Musculoskeletal:   [] Joint swelling   [] Joint pain   [] Low back pain  []  History of Knee Replacement [x] Arthritis [] back Surgeries  []  Spinal Stenosis    Hematologic:  [] Easy bruising  [] Easy bleeding   [] Hypercoagulable state   [] Anemic Gastrointestinal:  [] Diarrhea   [] Vomiting  [] Gastroesophageal reflux/heartburn   [] Difficulty swallowing. [] Abdominal pain Genitourinary:  [] Chronic kidney disease   [] Difficult urination  [] Anuric   [] Blood in urine [] Frequent urination  [] Burning with urination   [] Hematuria Skin:  [] Rashes   [] Ulcers [] Wounds Psychological:  [] History of anxiety   []  History of major depression  []  Memory Difficulties      OBJECTIVE:   Physical Exam  BP 119/65 (BP Location: Right Arm)   Pulse 75   Resp 17   Ht 5\' 10"  (1.778 m)   Wt 294 lb (133.4 kg)   BMI 42.18 kg/m   Gen: WD/WN, NAD Head: [place]/AT, No temporalis wasting.  Ear/Nose/Throat: Hearing grossly intact, nares w/o erythema or drainage Eyes: PER, EOMI, sclera nonicteric.  Neck: Supple, no masses.  No JVD.  Pulmonary:  Good air movement, no use of accessory muscles.  Cardiac: RRR Vascular:  2+ edema bilaterally, several small shallow ulcerations on right lower extremity Vessel Right Left  Radial Palpable Palpable  Dorsalis Pedis Palpable Palpable  Posterior Tibial Palpable Palpable   Gastrointestinal: soft, non-distended. No guarding/no peritoneal signs.  Musculoskeletal: Uses cane for ambulation..  No deformity or atrophy.  Neurologic: Pain and light touch intact in extremities.  Symmetrical.  Speech is fluent. Motor exam as listed above. Psychiatric: Judgment intact, Mood & affect appropriate for pt's clinical situation. Dermatologic: No Venous rashes. .  No changes consistent with cellulitis. Lymph : No Cervical lymphadenopathy, no  lichenification right lower extremity      ASSESSMENT AND PLAN:  1. Venous ulcer of ankle, right (HCC) We will continue to have the patient placed in [place] wraps on a weekly basis.  Some lingering dead skin was debrided from the patient's right lower extremity today and the patient tolerated this well.  The patient will follow up to our office in 4 weeks for reevaluation of the lower extremity edema and ulceration.  Patient will also continue to utilize his lymphedema pump.  2. Benign hypertension with chronic kidney disease, stage III (HCC) Continue antihypertensive medications as already ordered, these medications have been reviewed and there are no changes at this time.   3. Primary osteoarthritis of right knee Continue NSAID medications as already ordered, these medications have been reviewed and there are no changes at this time.  Continued activity and therapy was stressed.    Current Outpatient Medications on File Prior to Visit  Medication Sig Dispense Refill  . allopurinol (ZYLOPRIM) 100 MG tablet Take 100 mg by mouth daily.    Marland Kitchen diltiazem (CARDIZEM LA) 240 MG 24 hr tablet Take 240 mg by mouth daily.    Marland Kitchen docusate sodium (COLACE) 100 MG capsule Take 100 mg by mouth daily.    . fexofenadine (ALLEGRA) 180 MG tablet Take 180 mg by mouth daily as needed.    . fluticasone (FLONASE) 50 MCG/ACT nasal spray Place 2 sprays into both nostrils daily as needed.     . furosemide (LASIX) 40 MG tablet Take 40 mg by mouth every other day.     . levETIRAcetam (KEPPRA) 500 MG tablet Take 500 mg by mouth 2 (two) times daily.     . metoprolol succinate (TOPROL-XL) 50 MG 24 hr tablet Take 50 mg by mouth daily.     . Multiple Vitamin (ONE-A-DAY MENS PO) Take by mouth daily.    Marland Kitchen olmesartan-hydrochlorothiazide (BENICAR HCT) 40-12.5 MG tablet TAKE 1 TABLET BY MOUTH ONCE DAILY    . Pumpkin Seed-Soy Germ (AZO BLADDER CONTROL/GO-LESS PO) Take 1 capsule by mouth daily.    . rivaroxaban (XARELTO) 20 MG  TABS tablet Take 20 mg by mouth daily with supper.     . tamsulosin (FLOMAX) 0.4 MG CAPS capsule TAKE 1 CAPSULE BY MOUTH ONCE DAILY ONE HALF HOUR FOLLOWING THE SAME MEAL [facility] DAY   External Pharmacy 05/18/2019    . vitamin C (ASCORBIC ACID) 500 MG tablet Take 500 mg by mouth daily.     Marland Kitchen gabapentin (NEURONTIN) 100 MG capsule Take 100 mg by mouth 3 (three) times daily.    . methylPREDNISolone (MEDROL DOSEPAK) 4 MG TBPK tablet TAKE BY MOUTH AS DIRECTED ON INSIDE OF PACKAGE    . traMADol (ULTRAM) 50 MG tablet Take 1 tablet (50 mg total) by mouth every 6 (six) hours as needed. (Patient not taking: Reported on 05/13/2019) 30 tablet 0   No current facility-administered medications on file prior to visit.    There are no Patient Instructions on file for this visit. No follow-ups on file.   Kris Hartmann, NP  This note was completed with Sales executive.  Any errors are purely unintentional.

## 2019-06-10 ENCOUNTER — Ambulatory Visit (INDEPENDENT_AMBULATORY_CARE_PROVIDER_SITE_OTHER): Payer: Medicare Other | Admitting: Nurse Practitioner

## 2019-06-10 ENCOUNTER — Encounter (INDEPENDENT_AMBULATORY_CARE_PROVIDER_SITE_OTHER): Payer: Self-pay | Admitting: Nurse Practitioner

## 2019-06-10 ENCOUNTER — Other Ambulatory Visit: Payer: Self-pay

## 2019-06-10 VITALS — BP 154/76 | HR 82 | Resp 16 | Wt 297.0 lb

## 2019-06-10 DIAGNOSIS — I890 Lymphedema, not elsewhere classified: Secondary | ICD-10-CM | POA: Diagnosis not present

## 2019-06-10 NOTE — Progress Notes (Signed)
History of Present Illness  There is no documented history at this time  Assessments & Plan   There are no diagnoses linked to this encounter.    Additional instructions  Subjective:  Patient presents with venous ulcer of the Bilateral lower extremity.    Procedure:  3 layer unna wrap was placed Bilateral lower extremity.   Plan:   Follow up in one week.  

## 2019-06-13 ENCOUNTER — Encounter (INDEPENDENT_AMBULATORY_CARE_PROVIDER_SITE_OTHER): Payer: Medicare Other

## 2019-06-14 ENCOUNTER — Other Ambulatory Visit: Payer: Self-pay

## 2019-06-14 ENCOUNTER — Encounter (INDEPENDENT_AMBULATORY_CARE_PROVIDER_SITE_OTHER): Payer: Self-pay | Admitting: Nurse Practitioner

## 2019-06-14 ENCOUNTER — Ambulatory Visit (INDEPENDENT_AMBULATORY_CARE_PROVIDER_SITE_OTHER): Payer: Medicare Other | Admitting: Nurse Practitioner

## 2019-06-14 ENCOUNTER — Ambulatory Visit: Payer: Medicare Other | Admitting: Podiatry

## 2019-06-14 VITALS — BP 136/75 | HR 87 | Resp 16 | Wt 287.0 lb

## 2019-06-14 DIAGNOSIS — I83013 Varicose veins of right lower extremity with ulcer of ankle: Secondary | ICD-10-CM

## 2019-06-14 DIAGNOSIS — L97319 Non-pressure chronic ulcer of right ankle with unspecified severity: Secondary | ICD-10-CM

## 2019-06-14 DIAGNOSIS — M79674 Pain in right toe(s): Secondary | ICD-10-CM | POA: Diagnosis not present

## 2019-06-14 DIAGNOSIS — L600 Ingrowing nail: Secondary | ICD-10-CM

## 2019-06-14 DIAGNOSIS — D689 Coagulation defect, unspecified: Secondary | ICD-10-CM

## 2019-06-14 NOTE — Patient Instructions (Signed)

## 2019-06-14 NOTE — Progress Notes (Signed)
History of Present Illness  There is no documented history at this time  Assessments & Plan   There are no diagnoses linked to this encounter.    Additional instructions  Subjective:  Patient presents with venous ulcer of the Bilateral lower extremity.    Procedure:  3 layer unna wrap was placed Bilateral lower extremity.   Plan:   Follow up in one week.  

## 2019-06-15 ENCOUNTER — Encounter: Payer: Self-pay | Admitting: Podiatry

## 2019-06-15 ENCOUNTER — Encounter (INDEPENDENT_AMBULATORY_CARE_PROVIDER_SITE_OTHER): Payer: Self-pay | Admitting: Nurse Practitioner

## 2019-06-15 NOTE — Progress Notes (Signed)
Subjective:  [name], male    DOB: 1940/09/21,  MRN: [id number]  Chief Complaint  Patient presents with  . Nail Problem    pt is here for a possible infected right great toenail, of both big toenails, pt states that pain has been going on for 2-3 weeks of the right big toenail, and a couple of days of the left big toenail    78 y.o. male presents with the above complaint.  Patient presents with painful right medial ingrown toenail.  Patient states it is been going on for 2 to 3 weeks.  He states that his left side has also started to bother him as well which has been going on for couple of weeks 2.  Patient states that they are both bruising and or swelling in his worse when wearing shoes.  However his right side is causing him a lot of pain today but not the left side.  He states he has been ambulating in regular sneakers he denies seeing anyone else for this.  He states that he has done some soaking but has not helped.  He denies any other acute complaints.   Review of Systems: Negative except as noted in the HPI. Denies N/V/F/Ch.  Past Medical History:  Diagnosis Date  . Arthritis   . Bilateral swelling of feet   . BPH (benign prostatic hyperplasia)   . Chronic kidney disease   . Diabetes mellitus without complication ([place])   . DVT (deep venous thrombosis) (HCC)    right lower extremity  . History of gout   . Hypertension   . Seizures ([place])   . Sleep apnea   . Tendonitis of foot    left    Current Outpatient Medications:  .  Accu-Chek FastClix Lancets MISC, USE TO CHECK GLUCOSE TWICE DAILY, Disp: , Rfl:  .  ACCU-CHEK GUIDE test strip, USE STRIP TO CHECK GLUCOSE TWICE DAILY, Disp: , Rfl:  .  allopurinol (ZYLOPRIM) 100 MG tablet, Take 100 mg by mouth daily., Disp: , Rfl:  .  Blood Glucose Monitoring Suppl (ACCU-CHEK GUIDE ME) w/Device KIT, 2 (two) times daily., Disp: , Rfl:  .  diltiazem (CARDIZEM LA) 240 MG 24 hr tablet, Take 240 mg by mouth daily., Disp:  , Rfl:  .  docusate sodium (COLACE) 100 MG capsule, Take 100 mg by mouth daily., Disp: , Rfl:  .  fexofenadine (ALLEGRA) 180 MG tablet, Take 180 mg by mouth daily as needed., Disp: , Rfl:  .  fluticasone (FLONASE) 50 MCG/ACT nasal spray, Place 2 sprays into both nostrils daily as needed. , Disp: , Rfl:  .  furosemide (LASIX) 40 MG tablet, Take 40 mg by mouth every other day. , Disp: , Rfl:  .  gabapentin (NEURONTIN) 100 MG capsule, Take 100 mg by mouth 3 (three) times daily., Disp: , Rfl:  .  levETIRAcetam (KEPPRA) 500 MG tablet, Take 500 mg by mouth 2 (two) times daily. , Disp: , Rfl:  .  methylPREDNISolone (MEDROL DOSEPAK) 4 MG TBPK tablet, TAKE BY MOUTH AS DIRECTED ON INSIDE OF PACKAGE, Disp: , Rfl:  .  metoprolol succinate (TOPROL-XL) 50 MG 24 hr tablet, Take 50 mg by mouth daily. , Disp: , Rfl:  .  Multiple Vitamin (ONE-A-DAY MENS PO), Take by mouth daily., Disp: , Rfl:  .  olmesartan-hydrochlorothiazide (BENICAR HCT) 40-12.5 MG tablet, TAKE 1 TABLET BY MOUTH ONCE DAILY, Disp: , Rfl:  .  Pumpkin Seed-Soy Germ (AZO BLADDER CONTROL/GO-LESS PO), Take 1  capsule by mouth daily., Disp: , Rfl:  .  rivaroxaban (XARELTO) 20 MG TABS tablet, Take 20 mg by mouth daily with supper. , Disp: , Rfl:  .  tamsulosin (FLOMAX) 0.4 MG CAPS capsule, TAKE 1 CAPSULE BY MOUTH ONCE DAILY ONE HALF HOUR FOLLOWING THE SAME MEAL EACH DAY   External Pharmacy 05/18/2019, Disp: , Rfl:  .  traMADol (ULTRAM) 50 MG tablet, Take 1 tablet (50 mg total) by mouth every 6 (six) hours as needed., Disp: 30 tablet, Rfl: 0 .  vitamin C (ASCORBIC ACID) 500 MG tablet, Take 500 mg by mouth daily. , Disp: , Rfl:   Social History   Tobacco Use  Smoking Status Former Smoker  . Packs/day: 1.00  . Years: 2.00  . Pack years: 2.00  . Types: Cigarettes  . Quit date: 06/23/1968  . Years since quitting: 51.0  Smokeless Tobacco Never Used  Tobacco Comment   25 years ago quit    Allergies  Allergen Reactions  . Shellfish Allergy Other  (See Comments) and Anaphylaxis    Unknown    Objective:  There were no vitals filed for this visit. There is no height or weight on file to calculate BMI. Constitutional Well developed. Well nourished.  Vascular Dorsalis pedis pulses palpable bilaterally. Posterior tibial pulses palpable bilaterally. Capillary refill normal to all digits.  No cyanosis or clubbing noted. Pedal hair growth normal.  Neurologic Normal speech. Oriented to person, place, and time. Epicritic sensation to light touch grossly present bilaterally.  Dermatologic Painful ingrowing nail at medial nail borders of the hallux nail right. No other open wounds. No skin lesions.  Orthopedic: Normal joint ROM without pain or crepitus bilaterally. No visible deformities. No bony tenderness.   Radiographs: None Assessment:  No diagnosis found. Plan:  Patient was evaluated and treated and all questions answered.  Ingrown Nail, right -Patient elects to proceed with minor surgery to remove ingrown toenail removal today. Consent reviewed and signed by patient. -Ingrown nail excised. See procedure note. -Educated on post-procedure care including soaking. Written instructions provided and reviewed. -Patient to follow up in 2 weeks for nail check.  Procedure: Excision of Ingrown Toenail Location: Right 1st toe medial nail borders. Anesthesia: Lidocaine 1% plain; 1.5 mL and Marcaine 0.5% plain; 1.5 mL, digital block. Skin Prep: Betadine. Dressing: Silvadene; telfa; dry, sterile, compression dressing. Technique: Following skin prep, the toe was exsanguinated and a tourniquet was secured at the base of the toe. The affected nail border was freed, split with a nail splitter, and excised. Chemical matrixectomy was then performed with phenol and irrigated out with alcohol. The tourniquet was then removed and sterile dressing applied. Disposition: Patient tolerated procedure well. Patient to return in 2 weeks for follow-up.    No follow-ups on file.

## 2019-06-21 ENCOUNTER — Ambulatory Visit: Payer: Medicare Other | Admitting: *Deleted

## 2019-06-21 ENCOUNTER — Encounter (INDEPENDENT_AMBULATORY_CARE_PROVIDER_SITE_OTHER): Payer: Medicare Other

## 2019-06-22 ENCOUNTER — Encounter (INDEPENDENT_AMBULATORY_CARE_PROVIDER_SITE_OTHER): Payer: Medicare Other

## 2019-06-24 ENCOUNTER — Inpatient Hospital Stay
Admission: EM | Admit: 2019-06-24 | Discharge: 2019-07-01 | DRG: 177 | Disposition: A | Discharge status: Skilled Nursing Facility | Payer: Medicare Other | Attending: Internal Medicine | Admitting: Internal Medicine

## 2019-06-24 ENCOUNTER — Emergency Department: Payer: Medicare Other

## 2019-06-24 ENCOUNTER — Other Ambulatory Visit: Payer: Self-pay

## 2019-06-24 ENCOUNTER — Encounter: Payer: Self-pay | Admitting: *Deleted

## 2019-06-24 DIAGNOSIS — N1831 Chronic kidney disease, stage 3a: Secondary | ICD-10-CM | POA: Diagnosis not present

## 2019-06-24 DIAGNOSIS — N400 Enlarged prostate without lower urinary tract symptoms: Secondary | ICD-10-CM | POA: Diagnosis not present

## 2019-06-24 DIAGNOSIS — R569 Unspecified convulsions: Secondary | ICD-10-CM

## 2019-06-24 DIAGNOSIS — Z9049 Acquired absence of other specified parts of digestive tract: Secondary | ICD-10-CM

## 2019-06-24 DIAGNOSIS — Z79899 Other long term (current) drug therapy: Secondary | ICD-10-CM

## 2019-06-24 DIAGNOSIS — G40909 Epilepsy, unspecified, not intractable, without status epilepticus: Secondary | ICD-10-CM | POA: Diagnosis present

## 2019-06-24 DIAGNOSIS — Z8249 Family history of ischemic heart disease and other diseases of the circulatory system: Secondary | ICD-10-CM

## 2019-06-24 DIAGNOSIS — R531 Weakness: Secondary | ICD-10-CM | POA: Diagnosis not present

## 2019-06-24 DIAGNOSIS — I890 Lymphedema, not elsewhere classified: Secondary | ICD-10-CM | POA: Diagnosis present

## 2019-06-24 DIAGNOSIS — U071 COVID-19: Principal | ICD-10-CM

## 2019-06-24 DIAGNOSIS — N1832 Chronic kidney disease, stage 3b: Secondary | ICD-10-CM | POA: Diagnosis present

## 2019-06-24 DIAGNOSIS — I10 Essential (primary) hypertension: Secondary | ICD-10-CM | POA: Diagnosis not present

## 2019-06-24 DIAGNOSIS — I129 Hypertensive chronic kidney disease with stage 1 through stage 4 chronic kidney disease, or unspecified chronic kidney disease: Secondary | ICD-10-CM | POA: Diagnosis present

## 2019-06-24 DIAGNOSIS — D631 Anemia in chronic kidney disease: Secondary | ICD-10-CM

## 2019-06-24 DIAGNOSIS — Z8261 Family history of arthritis: Secondary | ICD-10-CM

## 2019-06-24 DIAGNOSIS — J9601 Acute respiratory failure with hypoxia: Secondary | ICD-10-CM | POA: Diagnosis present

## 2019-06-24 DIAGNOSIS — M1711 Unilateral primary osteoarthritis, right knee: Secondary | ICD-10-CM | POA: Diagnosis present

## 2019-06-24 DIAGNOSIS — M109 Gout, unspecified: Secondary | ICD-10-CM | POA: Diagnosis present

## 2019-06-24 DIAGNOSIS — J9691 Respiratory failure, unspecified with hypoxia: Secondary | ICD-10-CM

## 2019-06-24 DIAGNOSIS — G4733 Obstructive sleep apnea (adult) (pediatric): Secondary | ICD-10-CM

## 2019-06-24 DIAGNOSIS — E1122 Type 2 diabetes mellitus with diabetic chronic kidney disease: Secondary | ICD-10-CM | POA: Diagnosis present

## 2019-06-24 DIAGNOSIS — I872 Venous insufficiency (chronic) (peripheral): Secondary | ICD-10-CM | POA: Diagnosis present

## 2019-06-24 DIAGNOSIS — N184 Chronic kidney disease, stage 4 (severe): Secondary | ICD-10-CM | POA: Diagnosis present

## 2019-06-24 DIAGNOSIS — N189 Chronic kidney disease, unspecified: Secondary | ICD-10-CM

## 2019-06-24 DIAGNOSIS — Z86718 Personal history of other venous thrombosis and embolism: Secondary | ICD-10-CM

## 2019-06-24 DIAGNOSIS — Z79891 Long term (current) use of opiate analgesic: Secondary | ICD-10-CM

## 2019-06-24 DIAGNOSIS — R0602 Shortness of breath: Secondary | ICD-10-CM | POA: Diagnosis present

## 2019-06-24 DIAGNOSIS — Z87891 Personal history of nicotine dependence: Secondary | ICD-10-CM

## 2019-06-24 DIAGNOSIS — Z833 Family history of diabetes mellitus: Secondary | ICD-10-CM | POA: Diagnosis not present

## 2019-06-24 DIAGNOSIS — Z9989 Dependence on other enabling machines and devices: Secondary | ICD-10-CM

## 2019-06-24 DIAGNOSIS — J309 Allergic rhinitis, unspecified: Secondary | ICD-10-CM | POA: Diagnosis present

## 2019-06-24 DIAGNOSIS — E875 Hyperkalemia: Secondary | ICD-10-CM | POA: Diagnosis present

## 2019-06-24 DIAGNOSIS — Z6841 Body Mass Index (BMI) 40.0 and over, adult: Secondary | ICD-10-CM | POA: Diagnosis not present

## 2019-06-24 DIAGNOSIS — N179 Acute kidney failure, unspecified: Secondary | ICD-10-CM | POA: Diagnosis present

## 2019-06-24 DIAGNOSIS — Z7901 Long term (current) use of anticoagulants: Secondary | ICD-10-CM

## 2019-06-24 DIAGNOSIS — J1282 Pneumonia due to coronavirus disease 2019: Secondary | ICD-10-CM | POA: Diagnosis present

## 2019-06-24 HISTORY — DX: COVID-19: U07.1

## 2019-06-24 LAB — CBC WITH DIFFERENTIAL/PLATELET
Abs Immature Granulocytes: 0.09 10*3/uL — ABNORMAL HIGH (ref 0.00–0.07)
Basophils Absolute: 0 10*3/uL (ref 0.0–0.1)
Basophils Relative: 0 %
Eosinophils Absolute: 0 10*3/uL (ref 0.0–0.5)
Eosinophils Relative: 0 %
HCT: 26.8 % — ABNORMAL LOW (ref 39.0–52.0)
Hemoglobin: 9.1 g/dL — ABNORMAL LOW (ref 13.0–17.0)
Immature Granulocytes: 1 %
Lymphocytes Relative: 9 %
Lymphs Abs: 0.8 10*3/uL (ref 0.7–4.0)
MCH: 23 pg — ABNORMAL LOW (ref 26.0–34.0)
MCHC: 34 g/dL (ref 30.0–36.0)
MCV: 67.8 fL — ABNORMAL LOW (ref 80.0–100.0)
Monocytes Absolute: 0.6 10*3/uL (ref 0.1–1.0)
Monocytes Relative: 7 %
Neutro Abs: 7.1 10*3/uL (ref 1.7–7.7)
Neutrophils Relative %: 83 %
Platelets: 179 10*3/uL (ref 150–400)
RBC: 3.95 MIL/uL — ABNORMAL LOW (ref 4.22–5.81)
RDW: 17.2 % — ABNORMAL HIGH (ref 11.5–15.5)
WBC: 8.5 10*3/uL (ref 4.0–10.5)
nRBC: 0.4 % — ABNORMAL HIGH (ref 0.0–0.2)

## 2019-06-24 LAB — COMPREHENSIVE METABOLIC PANEL
ALT: 25 U/L (ref 0–44)
AST: 47 U/L — ABNORMAL HIGH (ref 15–41)
Albumin: 3.1 g/dL — ABNORMAL LOW (ref 3.5–5.0)
Alkaline Phosphatase: 52 U/L (ref 38–126)
Anion gap: 9 (ref 5–15)
BUN: 47 mg/dL — ABNORMAL HIGH (ref 8–23)
CO2: 20 mmol/L — ABNORMAL LOW (ref 22–32)
Calcium: 8.7 mg/dL — ABNORMAL LOW (ref 8.9–10.3)
Chloride: 106 mmol/L (ref 98–111)
Creatinine, Ser: 2.72 mg/dL — ABNORMAL HIGH (ref 0.61–1.24)
GFR calc Af Amer: 25 mL/min — ABNORMAL LOW (ref >60)
GFR calc non Af Amer: 22 mL/min — ABNORMAL LOW (ref >60)
Glucose, Bld: 117 mg/dL — ABNORMAL HIGH (ref 70–99)
Potassium: 4.2 mmol/L (ref 3.5–5.1)
Sodium: 135 mmol/L (ref 135–145)
Total Bilirubin: 0.7 mg/dL (ref 0.3–1.2)
Total Protein: 6.9 g/dL (ref 6.5–8.1)

## 2019-06-24 LAB — FERRITIN: Ferritin: 107 ng/mL (ref 24–336)

## 2019-06-24 LAB — PROCALCITONIN: Procalcitonin: 0.53 ng/mL

## 2019-06-24 LAB — FIBRIN DERIVATIVES D-DIMER (ARMC ONLY): Fibrin derivatives D-dimer (ARMC): 901.84 ng/mL (FEU) — ABNORMAL HIGH (ref 0.00–499.00)

## 2019-06-24 LAB — ABO/RH: ABO/RH(D): B POS

## 2019-06-24 LAB — FIBRINOGEN: Fibrinogen: 587 mg/dL — ABNORMAL HIGH (ref 210–475)

## 2019-06-24 LAB — LACTIC ACID, PLASMA: Lactic Acid, Venous: 1.1 mmol/L (ref 0.5–1.9)

## 2019-06-24 LAB — TRIGLYCERIDES: Triglycerides: 66 mg/dL (ref <150)

## 2019-06-24 LAB — LACTATE DEHYDROGENASE: LDH: 231 U/L — ABNORMAL HIGH (ref 98–192)

## 2019-06-24 MED ORDER — ALLOPURINOL 100 MG PO TABS
100.0000 mg | ORAL_TABLET | Freq: Every day | ORAL | Status: DC
  Administered 2019-06-25 – 2019-07-01 (×7): 100 mg via ORAL
  Filled 2019-06-24 (×7): qty 1

## 2019-06-24 MED ORDER — SODIUM CHLORIDE 0.9 % IV SOLN
200.0000 mg | Freq: Once | INTRAVENOUS | Status: AC
  Administered 2019-06-25: 200 mg via INTRAVENOUS
  Filled 2019-06-24: qty 200

## 2019-06-24 MED ORDER — FUROSEMIDE 40 MG PO TABS
40.0000 mg | ORAL_TABLET | ORAL | Status: DC
  Administered 2019-06-25 – 2019-06-29 (×3): 40 mg via ORAL
  Filled 2019-06-24 (×3): qty 1

## 2019-06-24 MED ORDER — DILTIAZEM HCL ER COATED BEADS 240 MG PO CP24
240.0000 mg | ORAL_CAPSULE | Freq: Every day | ORAL | Status: DC
  Administered 2019-06-25 – 2019-06-30 (×6): 240 mg via ORAL
  Filled 2019-06-24: qty 1
  Filled 2019-06-24 (×2): qty 2
  Filled 2019-06-24 (×2): qty 1
  Filled 2019-06-24: qty 2
  Filled 2019-06-24: qty 1
  Filled 2019-06-24 (×2): qty 2
  Filled 2019-06-24 (×3): qty 1
  Filled 2019-06-24: qty 2

## 2019-06-24 MED ORDER — ASCORBIC ACID 500 MG PO TABS
500.0000 mg | ORAL_TABLET | Freq: Every day | ORAL | Status: DC
  Administered 2019-06-25 – 2019-07-01 (×6): 500 mg via ORAL
  Filled 2019-06-24 (×6): qty 1

## 2019-06-24 MED ORDER — SODIUM CHLORIDE 0.9 % IV SOLN
500.0000 mg | INTRAVENOUS | Status: DC
  Filled 2019-06-24: qty 500

## 2019-06-24 MED ORDER — SODIUM CHLORIDE 0.9 % IV SOLN
2.0000 g | INTRAVENOUS | Status: DC
  Administered 2019-06-24: 2 g via INTRAVENOUS
  Filled 2019-06-24: qty 20

## 2019-06-24 MED ORDER — METOPROLOL SUCCINATE ER 50 MG PO TB24
50.0000 mg | ORAL_TABLET | Freq: Every day | ORAL | Status: DC
  Administered 2019-06-25 – 2019-07-01 (×6): 50 mg via ORAL
  Filled 2019-06-24 (×7): qty 1

## 2019-06-24 MED ORDER — DEXAMETHASONE SODIUM PHOSPHATE 10 MG/ML IJ SOLN
6.0000 mg | INTRAMUSCULAR | Status: DC
  Administered 2019-06-25 – 2019-06-28 (×5): 6 mg via INTRAVENOUS
  Filled 2019-06-24 (×4): qty 0.6
  Filled 2019-06-24: qty 1
  Filled 2019-06-24: qty 0.6

## 2019-06-24 MED ORDER — ACETAMINOPHEN 500 MG PO TABS
ORAL_TABLET | ORAL | Status: AC
  Administered 2019-06-24: 20:00:00 1000 mg via ORAL
  Filled 2019-06-24: qty 2

## 2019-06-24 MED ORDER — SODIUM CHLORIDE 0.9 % IV SOLN
1.0000 g | INTRAVENOUS | Status: AC
  Administered 2019-06-25 – 2019-06-28 (×4): 1 g via INTRAVENOUS
  Filled 2019-06-24: qty 1
  Filled 2019-06-24: qty 10
  Filled 2019-06-24: qty 1
  Filled 2019-06-24: qty 10

## 2019-06-24 MED ORDER — SODIUM CHLORIDE 0.9 % IV SOLN
500.0000 mg | INTRAVENOUS | Status: DC
  Administered 2019-06-25 – 2019-06-26 (×3): 500 mg via INTRAVENOUS
  Filled 2019-06-24 (×3): qty 500

## 2019-06-24 MED ORDER — GABAPENTIN 100 MG PO CAPS
100.0000 mg | ORAL_CAPSULE | Freq: Three times a day (TID) | ORAL | Status: DC
  Administered 2019-06-25 – 2019-07-01 (×17): 100 mg via ORAL
  Filled 2019-06-24 (×18): qty 1

## 2019-06-24 MED ORDER — TAMSULOSIN HCL 0.4 MG PO CAPS
0.4000 mg | ORAL_CAPSULE | Freq: Every day | ORAL | Status: DC
  Administered 2019-06-25 – 2019-07-01 (×7): 0.4 mg via ORAL
  Filled 2019-06-24 (×8): qty 1

## 2019-06-24 MED ORDER — RIVAROXABAN 15 MG PO TABS
15.0000 mg | ORAL_TABLET | Freq: Every day | ORAL | Status: DC
  Filled 2019-06-24: qty 1

## 2019-06-24 MED ORDER — SODIUM CHLORIDE 0.9 % IV SOLN
100.0000 mg | Freq: Every day | INTRAVENOUS | Status: AC
  Administered 2019-06-25 – 2019-06-28 (×4): 100 mg via INTRAVENOUS
  Filled 2019-06-24: qty 100
  Filled 2019-06-24 (×2): qty 20
  Filled 2019-06-24: qty 100

## 2019-06-24 MED ORDER — ACETAMINOPHEN 500 MG PO TABS: 1000.0000 mg | ORAL_TABLET | Freq: Once | ORAL | Status: AC

## 2019-06-24 MED ORDER — DOCUSATE SODIUM 100 MG PO CAPS
100.0000 mg | ORAL_CAPSULE | Freq: Every day | ORAL | Status: DC
  Administered 2019-06-25 – 2019-07-01 (×7): 100 mg via ORAL
  Filled 2019-06-24 (×7): qty 1

## 2019-06-24 MED ORDER — SODIUM CHLORIDE 0.9 % IV BOLUS
500.0000 mL | Freq: Once | INTRAVENOUS | Status: AC
  Administered 2019-06-25: 500 mL via INTRAVENOUS

## 2019-06-24 MED ORDER — DEXAMETHASONE SODIUM PHOSPHATE 10 MG/ML IJ SOLN
10.0000 mg | Freq: Once | INTRAMUSCULAR | Status: AC
  Administered 2019-06-24: 21:00:00 10 mg via INTRAVENOUS
  Filled 2019-06-24: qty 1

## 2019-06-24 MED ORDER — LEVETIRACETAM 500 MG PO TABS
500.0000 mg | ORAL_TABLET | Freq: Two times a day (BID) | ORAL | Status: DC
  Administered 2019-06-25 – 2019-07-01 (×14): 500 mg via ORAL
  Filled 2019-06-24 (×14): qty 1

## 2019-06-24 NOTE — H&P (Addendum)
History and Physical    [facility]. ESP:[id number] DOB: Dec 08, 1940 DOA: 06/24/2019  PCP: Kirk Ruths, MD  Patient coming from: Home, lives with wife, daughter and grandson  I have personally briefly reviewed patient's old medical records in [place]  Chief Complaint: increasing cough and shortness of breath  HPI: Justin Shannon. is a 79 y.o. 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  Patient recently tested positive at [facility] for Covid on 12/28.  Since then he has noticed increasing cough productive of sputum as well as shortness of breath.  He suffers from chronic knee pain and normally ambulates with a cane but has been having increasing weakness as well.  Notes a temperature of 100.7 at home.  Wife at home is also positive for Covid. He denies any tobacco, alcohol illicit drug use.  ED Course: He was febrile with a temperature of 102.3, normotensive and required up to 2 L via nasal cannula with oxygen saturation of 100%.  Patient had oxygen desaturation down to 88% on room air. WBC of 8.5, hemoglobin of 9.1. CMP still pending. Lactic of 1.1. Fibrogen of 587.  Fibrin derivative elevated to 901. CXR shows bilateral opacity consistented with multifocal pneumonia.   He was given IV decadron, Azithromycin and Rocephin.   Review of Systems:  Constitutional: No Weight Change, + Fever ENT/Mouth: No sore throat, No Rhinorrhea Eyes:  No Vision Changes Cardiovascular: No Chest Pain, no SOB Respiratory: + Cough, +Sputum, No Wheezing, no Dyspnea  Gastrointestinal: No Nausea, No Vomiting, No Diarrhea,  No Pain Genitourinary: no Urinary Incontinence Musculoskeletal: No Arthralgias, No Myalgias Skin: No Skin Lesions, No Pruritus, Neuro: + Weakness,  Psych:  + decrease  appetite Heme/Lymph: No Bruising, No Bleeding  Past Medical History:  Diagnosis Date  . Arthritis   . Bilateral swelling of feet   . BPH (benign prostatic hyperplasia)   . Chronic kidney disease   . Diabetes mellitus without complication ([place])   . DVT (deep venous thrombosis) (HCC)    right lower extremity  . History of gout   . Hypertension   . Seizures ([place])   . Sleep apnea   . Tendonitis of foot    left    Past Surgical History:  Procedure Laterality Date  . APPENDECTOMY    . HERNIA REPAIR    . PROSTATE SURGERY       reports that he quit smoking about 51 years ago. His smoking use included cigarettes. He has a 2.00 pack-year smoking history. He has never used smokeless tobacco. He reports that he does not drink alcohol or use drugs.  Allergies  Allergen Reactions  . Shellfish Allergy Other (See Comments) and Anaphylaxis    Unknown     Family History  Problem Relation Age of Onset  . Arthritis Mother   . Hypertension Mother   . Diabetes Brother      Prior to Admission medications   Medication Sig Start Date End Date Taking? Authorizing Provider  Accu-Chek FastClix Lancets MISC USE TO CHECK GLUCOSE TWICE DAILY 06/07/19   [provider]  ACCU-CHEK GUIDE test strip USE STRIP TO CHECK GLUCOSE TWICE DAILY 06/07/19   [provider]  allopurinol (ZYLOPRIM) 100 MG tablet Take 100 mg by mouth daily.    [provider]  Blood Glucose  Monitoring Suppl (ACCU-CHEK GUIDE ME) w/Device KIT 2 (two) times daily. 06/07/19   [provider]  diltiazem (CARDIZEM LA) 240 MG 24 hr tablet Take 240 mg by mouth daily.    [provider]  docusate sodium (COLACE) 100 MG capsule Take 100 mg by mouth daily.    [provider]  fexofenadine (ALLEGRA) 180 MG tablet Take 180 mg by mouth daily as needed.    [provider]  fluticasone (FLONASE) 50 MCG/ACT nasal spray Place 2 sprays into both nostrils daily as needed.  09/26/14    [provider]  furosemide (LASIX) 40 MG tablet Take 40 mg by mouth every other day.  06/20/17   [provider]  gabapentin (NEURONTIN) 100 MG capsule Take 100 mg by mouth 3 (three) times daily. 05/03/19   [provider]  levETIRAcetam (KEPPRA) 500 MG tablet Take 500 mg by mouth 2 (two) times daily.  03/08/19   [provider]  methylPREDNISolone (MEDROL DOSEPAK) 4 MG TBPK tablet TAKE BY MOUTH AS DIRECTED ON INSIDE OF PACKAGE 05/25/19   [provider]  metoprolol succinate (TOPROL-XL) 50 MG 24 hr tablet Take 50 mg by mouth daily.  10/30/17   [provider]  Multiple Vitamin (ONE-A-DAY MENS PO) Take by mouth daily.    [provider]  olmesartan-hydrochlorothiazide (BENICAR HCT) 40-12.5 MG tablet TAKE 1 TABLET BY MOUTH ONCE DAILY 03/22/18   [provider]  Pumpkin Seed-Soy Germ (AZO BLADDER CONTROL/GO-LESS PO) Take 1 capsule by mouth daily.    [provider]  rivaroxaban (XARELTO) 20 MG TABS tablet Take 20 mg by mouth daily with supper.  01/20/19   [provider]  tamsulosin (FLOMAX) 0.4 MG CAPS capsule TAKE 1 CAPSULE BY MOUTH ONCE DAILY ONE HALF HOUR FOLLOWING THE SAME MEAL [facility] DAY   External Pharmacy 05/18/2019 05/18/19   [provider]  traMADol (ULTRAM) 50 MG tablet Take 1 tablet (50 mg total) by mouth every 6 (six) hours as needed. 08/05/18   Gardiner Barefoot, DPM  vitamin C (ASCORBIC ACID) 500 MG tablet Take 500 mg by mouth daily.     [provider]    Physical Exam: Vitals:   06/24/19 1937 06/24/19 1938 06/24/19 2015 06/24/19 2100  BP: (!) 146/76   126/71  Pulse: (!) 109   93  Resp: (!) 34   (!) 24  Temp: (!) 102.3 F (39.1 C)   100.2 F (37.9 C)  TempSrc: Oral     SpO2: 99%  100% 99%  Weight:  127 kg    Height:  5' 10"  (1.778 m)      Constitutional: NAD, calm, comfortable, non toxic appearing male laying at 30 degree incline in bed Vitals:   06/24/19 1937 06/24/19 1938  06/24/19 2015 06/24/19 2100  BP: (!) 146/76   126/71  Pulse: (!) 109   93  Resp: (!) 34   (!) 24  Temp: (!) 102.3 F (39.1 C)   100.2 F (37.9 C)  TempSrc: Oral     SpO2: 99%  100% 99%  Weight:  127 kg    Height:  5' 10"  (1.778 m)     Eyes: PERRL, lids and conjunctivae normal ENMT: Mucous membranes are moist. Neck: normal, supple Respiratory: clear to auscultation bilaterally, no wheezing, no crackles. Normal respiratory effort on 2L at rest but gets tachypneic with movement. No accessory muscle use.  Cardiovascular: Regular rate and rhythm with HR around 96, no murmurs / rubs / gallops. Bilateral LE  edema up to knee wrapped in compression stockings- chronic for the past year Abdomen: no tenderness, no masses palpated. Healed surgical scar to epigastric area. Bowel sounds positive.  Musculoskeletal: no clubbing / cyanosis. No joint deformity upper and lower extremities.Normal muscle tone.  Skin: no rashes, lesions, ulcers. No induration Neurologic: CN 2-12 grossly intact. Sensation intact. Strength 3/5 in lower extremities  Psychiatric: Normal judgment and insight. Alert and oriented x 3. Flat affect.    Labs on Admission: I have personally reviewed following labs and imaging studies  CBC: Recent Labs  Lab 06/24/19 1956  WBC 8.5  NEUTROABS 7.1  HGB 9.1*  HCT 26.8*  MCV 67.8*  PLT 559   Basic Metabolic Panel: No results for input(s): NA, K, CL, CO2, GLUCOSE, BUN, CREATININE, CALCIUM, MG, PHOS in the last 168 hours. GFR: CrCl cannot be calculated ([name] most recent lab result is older than the maximum 21 days allowed.). Liver Function Tests: No results for input(s): AST, ALT, ALKPHOS, BILITOT, PROT, ALBUMIN in the last 168 hours. No results for input(s): LIPASE, AMYLASE in the last 168 hours. No results for input(s): AMMONIA in the last 168 hours. Coagulation Profile: No results for input(s): INR, PROTIME in the last 168 hours. Cardiac Enzymes: No results for  input(s): CKTOTAL, CKMB, CKMBINDEX, TROPONINI in the last 168 hours. BNP (last 3 results) No results for input(s): PROBNP in the last 8760 hours. HbA1C: No results for input(s): HGBA1C in the last 72 hours. CBG: No results for input(s): GLUCAP in the last 168 hours. Lipid Profile: No results for input(s): CHOL, HDL, LDLCALC, TRIG, CHOLHDL, LDLDIRECT in the last 72 hours. Thyroid Function Tests: No results for input(s): TSH, T4TOTAL, FREET4, T3FREE, THYROIDAB in the last 72 hours. Anemia Panel: No results for input(s): VITAMINB12, FOLATE, FERRITIN, TIBC, IRON, RETICCTPCT in the last 72 hours. Urine analysis:    Component Value Date/Time   COLORURINE Straw 09/02/2012 0435   APPEARANCEUR Clear 09/02/2012 0435   LABSPEC 1.012 09/02/2012 0435   PHURINE 5.0 09/02/2012 0435   GLUCOSEU Negative 09/02/2012 0435   HGBUR 1+ 09/02/2012 0435   BILIRUBINUR Negative 09/02/2012 0435   KETONESUR Negative 09/02/2012 0435   PROTEINUR Negative 09/02/2012 0435   NITRITE Negative 09/02/2012 0435   LEUKOCYTESUR Negative 09/02/2012 0435    Radiological Exams on Admission: DG Chest Port 1 View  Result Date: 06/24/2019 CLINICAL DATA:  Shortness of breath, COVID EXAM: PORTABLE CHEST 1 VIEW COMPARISON:  08/28/2011 FINDINGS: The heart size and mediastinal contours are within normal limits. Heterogeneous airspace opacity bilaterally. The visualized skeletal structures are unremarkable. IMPRESSION: Heterogeneous airspace opacity bilaterally, consistent with multifocal infection and COVID-19. Electronically Signed   By: Eddie Candle M.D.   On: 06/24/2019 20:01    EKG: Independently reviewed.   Assessment/Plan  Acute hypoxic respiratory failure due to COVID and possible superimposed bacterial pneumonia 2L on admit Maintain oxygen saturation > 92% IV decadron daily Remdesvir  Will continue Rocephin and Azithromycin given elevated PCT. Follow serial PCT.  Follow inflammatory markers  Generalized  Weakness Suspect this is acute on chronic from deconditioning Ambulates with cane at baseline PT eval  Chronic Anemia  Hgb 9.1 on admit. Previous hbg from outside lab was 8.7 on 02/2019 which appears to be around his baseline continue follow up outpatient  History of epilepsy Continue Keppra  Acute on Chronic kidney disease stage IV Baseline creatinine around 2-2.5 Admit 2.7 500cc bolus  Avoid nephrotoxic agent   Type 2 diabetes Last HbA1C of 6.7 on 04/2019 117  on admit. Monitor for now  History of DVT of the lower extremity Continue Xarelto  Hypertension Continue diltiazem, metoprolol and lasix Hold olmesartan -HCTZ due to AKI   Lymphedema Continue compression stockings  OSA Will hold off on CPAP to decrease aerosolizing Covid infection   DVT prophylaxis:chronically on Xarelto Code Status:Full Family Communication: Plan discussed with patient at bedside  disposition Plan: Home with at least 2 midnight stays  Consults called:  Admission status: inpatient   Carrol Hougland T Ladeidra Borys DO [facility]  If 7PM-7AM, please contact night-coverage [email or web address] Password [facility]  06/24/2019, 9:37 PM

## 2019-06-24 NOTE — ED Provider Notes (Signed)
[facility] Emergency Department Provider Note  ____________________________________________  Time seen: Approximately 8:01 PM  I have reviewed the triage vital signs and the nursing notes.   HISTORY  Chief Complaint Shortness of Breath    HPI Justin Helmes. is a 79 y.o. male with a history of lower extremity DVT, diabetes, hypertension, CKD who was recently diagnosed with Covid on June 20, 2019 in the [place] system and has several family members in his household positive for Covid as well.  He had some shortness of breath over the last 3 days, which has become much worse today.  Dyspnea on exertion.  Frequent cough.  Feels like he cannot catch his breath.  EMS report initial room air oxygen saturation of 88%.  Denies chest pain.      Past Medical History:  Diagnosis Date  . Arthritis   . Bilateral swelling of feet   . BPH (benign prostatic hyperplasia)   . Chronic kidney disease   . Diabetes mellitus without complication ([place])   . DVT (deep venous thrombosis) (HCC)    right lower extremity  . History of gout   . Hypertension   . Seizures ([place])   . Sleep apnea   . Tendonitis of foot    left     Patient Active Problem List   Diagnosis Date Noted  . Primary osteoarthritis of right knee 04/03/2019  . Venous ulcer of ankle, right ([place]) 05/08/2018  . Use of cane as ambulatory aid 04/27/2018  . Type 2 diabetes mellitus with stage 4 chronic kidney disease, without long-term current use of insulin ([place]) 06/11/2017  . Lymphedema 05/04/2016  . Chronic venous insufficiency 05/04/2016  . Toe sprain 01/24/2016  . Health care maintenance 12/11/2014  . Morbid (severe) obesity due to excess calories ([place]) 05/24/2014  . Morbid obesity with body mass index of 40.0-44.9 in adult [facility]) 05/24/2014  . OSA on CPAP 01/10/2014  . Chronic kidney disease (CKD), stage III (moderate) 11/22/2013  . Allergic rhinitis 11/06/2013  . Benign hypertension with chronic  kidney disease, stage III ([place]) 11/06/2013  . Epilepsy ([place]) 11/06/2013  . Gout 11/06/2013  . History of DVT (deep vein thrombosis) 11/06/2013  . Nocturnal seizures ([place]) 11/06/2013  . Chronic deep vein thrombosis (DVT) of proximal vein of lower extremity ([place]) 11/06/2013     Past Surgical History:  Procedure Laterality Date  . APPENDECTOMY    . HERNIA REPAIR    . PROSTATE SURGERY       Prior to Admission medications   Medication Sig Start Date End Date Taking? Authorizing Provider  Accu-Chek FastClix Lancets MISC USE TO CHECK GLUCOSE TWICE DAILY 06/07/19   [provider]  ACCU-CHEK GUIDE test strip USE STRIP TO CHECK GLUCOSE TWICE DAILY 06/07/19   [provider]  allopurinol (ZYLOPRIM) 100 MG tablet Take 100 mg by mouth daily.    [provider]  Blood Glucose Monitoring Suppl (ACCU-CHEK GUIDE ME) w/Device KIT 2 (two) times daily. 06/07/19   [provider]  diltiazem (CARDIZEM LA) 240 MG 24 hr tablet Take 240 mg by mouth daily.    [provider]  docusate sodium (COLACE) 100 MG capsule Take 100 mg by mouth daily.    [provider]  fexofenadine (ALLEGRA) 180 MG tablet Take 180 mg by mouth daily as needed.    [provider]  fluticasone (FLONASE) 50 MCG/ACT nasal spray Place 2 sprays into both nostrils daily as needed.  09/26/14   [provider]  furosemide (LASIX) 40 MG tablet Take 40 mg by mouth every other day.  06/20/17   [provider]  gabapentin (NEURONTIN) 100 MG capsule Take 100 mg by mouth 3 (three) times daily. 05/03/19   [provider]  levETIRAcetam (KEPPRA) 500 MG tablet Take 500 mg by mouth 2 (two) times daily.  03/08/19   [provider]  methylPREDNISolone (MEDROL DOSEPAK) 4 MG TBPK tablet TAKE BY MOUTH AS DIRECTED ON INSIDE OF PACKAGE 05/25/19   [provider]  metoprolol succinate (TOPROL-XL) 50 MG 24 hr tablet Take 50 mg by mouth daily.  10/30/17    [provider]  Multiple Vitamin (ONE-A-DAY MENS PO) Take by mouth daily.    [provider]  olmesartan-hydrochlorothiazide (BENICAR HCT) 40-12.5 MG tablet TAKE 1 TABLET BY MOUTH ONCE DAILY 03/22/18   [provider]  Pumpkin Seed-Soy Germ (AZO BLADDER CONTROL/GO-LESS PO) Take 1 capsule by mouth daily.    [provider]  rivaroxaban (XARELTO) 20 MG TABS tablet Take 20 mg by mouth daily with supper.  01/20/19   [provider]  tamsulosin (FLOMAX) 0.4 MG CAPS capsule TAKE 1 CAPSULE BY MOUTH ONCE DAILY ONE HALF HOUR FOLLOWING THE SAME MEAL [facility] DAY   External Pharmacy 05/18/2019 05/18/19   [provider]  traMADol (ULTRAM) 50 MG tablet Take 1 tablet (50 mg total) by mouth every 6 (six) hours as needed. 08/05/18   Gardiner Barefoot, DPM  vitamin C (ASCORBIC ACID) 500 MG tablet Take 500 mg by mouth daily.     [provider]     Allergies Shellfish allergy   Family History  Problem Relation Age of Onset  . Arthritis Mother   . Hypertension Mother   . Diabetes Brother     Social History Social History   Tobacco Use  . Smoking status: Former Smoker    Packs/day: 1.00    Years: 2.00    Pack years: 2.00    Types: Cigarettes    Quit date: 06/23/1968    Years since quitting: 51.0  . Smokeless tobacco: Never Used  . Tobacco comment: 25 years ago quit  Substance Use Topics  . Alcohol use: No  . Drug use: No    Review of Systems  Constitutional:   Positive fever and chills.  ENT:   No sore throat. No rhinorrhea. Cardiovascular:   No chest pain or syncope. Respiratory:   Positive shortness of breath and cough. Gastrointestinal:   Negative for abdominal pain, vomiting and diarrhea.  Musculoskeletal:   Negative for focal pain or swelling All other systems reviewed and are negative except as documented above in ROS and HPI.  ____________________________________________   PHYSICAL EXAM:  VITAL SIGNS: ED Triage Vitals   Enc Vitals Group     BP 06/24/19 1937 (!) 146/76     Pulse Rate 06/24/19 1937 (!) 109     Resp 06/24/19 1937 (!) 34     Temp 06/24/19 1937 (!) 102.3 F (39.1 C)     Temp Source 06/24/19 1937 Oral     SpO2 06/24/19 1937 99 %     Weight 06/24/19 1938 280 lb (127 kg)     Height 06/24/19 1938 5' 10"  (1.778 m)     Head Circumference --      Peak Flow --      Pain Score 06/24/19 1938 0     Pain Loc --      Pain Edu? --      Excl. in [place]? --  Vital signs reviewed, nursing assessments reviewed.   Constitutional:   Alert and oriented.  Ill-appearing Eyes:   Conjunctivae are normal. EOMI. PERRL. ENT      Head:   Normocephalic and atraumatic.      Nose:   Wearing a mask.      Mouth/Throat:   Wearing a mask.      Neck:   No meningismus. Full ROM. Hematological/Lymphatic/Immunilogical:   No cervical lymphadenopathy. Cardiovascular:   Tachycardia heart rate 110. Symmetric bilateral radial and DP pulses.  No murmurs. Cap refill less than 2 seconds. Respiratory:   Tachypnea, accessory muscle use.  Coarse breath sounds diffusely.  Shallow respirations which may be due to his morbid obesity. Gastrointestinal:   Soft and nontender. Non distended. There is no CVA tenderness.  No rebound, rigidity, or guarding.  Musculoskeletal:   Normal range of motion in all extremities. No joint effusions.  No lower extremity tenderness.  Brawny lymphedema bilateral lower extremities Neurologic:   Normal speech and language.  Motor grossly intact. No acute focal neurologic deficits are appreciated.  Skin:    Skin is warm, dry and intact. No rash noted.  No petechiae, purpura, or bullae.  ____________________________________________    LABS (pertinent positives/negatives) (all labs ordered are listed, but only abnormal results are displayed) Labs Reviewed  CBC WITH DIFFERENTIAL/PLATELET - Abnormal; Notable for the following components:      Result Value   RBC 3.95 (*)    Hemoglobin 9.1 (*)    HCT  26.8 (*)    MCV 67.8 (*)    MCH 23.0 (*)    RDW 17.2 (*)    nRBC 0.4 (*)    Abs Immature Granulocytes 0.09 (*)    All other components within normal limits  FIBRINOGEN - Abnormal; Notable for the following components:   Fibrinogen 587 (*)    All other components within normal limits  CULTURE, BLOOD (ROUTINE X 2)  CULTURE, BLOOD (ROUTINE X 2)  LACTIC ACID, PLASMA  LACTIC ACID, PLASMA  FIBRIN DERIVATIVES D-DIMER ([facility] ONLY)  C-REACTIVE PROTEIN  COMPREHENSIVE METABOLIC PANEL  FERRITIN  LACTATE DEHYDROGENASE  PROCALCITONIN  TRIGLYCERIDES   ____________________________________________   EKG  Interpreted by me Sinus tachycardia rate 105.  Left axis, normal intervals.  Normal QRS ST segments and T waves.  No evidence of right heart strain.  ____________________________________________    RADIOLOGY  DG Chest Port 1 View  Result Date: 06/24/2019 CLINICAL DATA:  Shortness of breath, COVID EXAM: PORTABLE CHEST 1 VIEW COMPARISON:  08/28/2011 FINDINGS: The heart size and mediastinal contours are within normal limits. Heterogeneous airspace opacity bilaterally. The visualized skeletal structures are unremarkable. IMPRESSION: Heterogeneous airspace opacity bilaterally, consistent with multifocal infection and COVID-19. Electronically Signed   By: Eddie Candle M.D.   On: 06/24/2019 20:01    ____________________________________________   PROCEDURES .Critical Care Performed by: Carrie Mew, MD Authorized by: Carrie Mew, MD   Critical care provider statement:    Critical care time (minutes):  35   Critical care time was exclusive of:  Separately billable procedures and treating other patients   Critical care was necessary to treat or prevent imminent or life-threatening deterioration of the following conditions:  Respiratory failure   Critical care was time spent personally by me on the following activities:  Development of treatment plan with patient or surrogate,  discussions with consultants, evaluation of patient's response to treatment, examination of patient, obtaining history from patient or surrogate, ordering and performing treatments and interventions, ordering and review of  laboratory studies, ordering and review of radiographic studies, pulse oximetry, re-evaluation of patient's condition and review of old charts    ____________________________________________    CLINICAL IMPRESSION / ASSESSMENT AND PLAN / ED COURSE  Medications ordered in the ED: Medications  cefTRIAXone (ROCEPHIN) 2 g in sodium chloride 0.9 % 100 mL IVPB (2 g Intravenous New Bag/Given 06/24/19 2100)  azithromycin (ZITHROMAX) 500 mg in sodium chloride 0.9 % 250 mL IVPB (has no administration in time range)  acetaminophen (TYLENOL) tablet 1,000 mg (1,000 mg Oral Given 06/24/19 1959)  dexamethasone (DECADRON) injection 10 mg (10 mg Intravenous Given 06/24/19 2059)    Pertinent labs & imaging results that were available during my care of the patient were reviewed by me and considered in my medical decision making (see chart for details).  [facility]. was evaluated in Emergency Department on 06/24/2019 for the symptoms described in the history of present illness. He was evaluated in the context of the global COVID-19 pandemic, which necessitated consideration that the patient might be at risk for infection with the SARS-CoV-2 virus that causes COVID-19. Institutional protocols and algorithms that pertain to the evaluation of patients at risk for COVID-19 are in a state of rapid change based on information released by regulatory bodies including the CDC and federal and state organizations. These policies and algorithms were followed during the patient's care in the ED.   Patient with known Covid 19 infection now presents with worsening shortness of breath cough and hypoxic respiratory failure.  Multiple vital sign abnormalities which are all consistent with a Covid inflammatory  syndrome.  Doubt sepsis or bacterial pneumonia.  with his obesity and heart failure, he is at a high risk for rapid deterioration and worsening lung function, so we will start him on nasal cannula oxygen, Decadron IV, remdesivir, plan to admit.  Clinical Course as of Jun 23 2102  Fri Jun 24, 2019  2057 Lactate normal.   [PS]    Clinical Course User Index [PS] Carrie Mew, MD     ----------------------------------------- 9:05 PM on 06/24/2019 -----------------------------------------  Discussed with hospitalist.  ____________________________________________   FINAL CLINICAL IMPRESSION(S) / ED DIAGNOSES    Final diagnoses:  Pneumonia due to COVID-19 virus  Acute respiratory failure with hypoxia ([place])  Morbid obesity [facility])     ED Discharge Orders    None      Portions of this note were generated with dragon dictation software. Dictation errors may occur despite best attempts at proofreading.   Carrie Mew, MD 06/24/19 2105

## 2019-06-24 NOTE — ED Triage Notes (Signed)
Per EMS report, patient had a positive Covid test on 12/28. Several family members are positive. Patient states shortness of breath is worse today. Patient has harsh cough. Patient has dyspnea at rest, pulse ox 88% on room air. Blood sugar by EMS was 138.

## 2019-06-25 DIAGNOSIS — U071 COVID-19: Principal | ICD-10-CM

## 2019-06-25 DIAGNOSIS — J1282 Pneumonia due to coronavirus disease 2019: Secondary | ICD-10-CM

## 2019-06-25 LAB — GLUCOSE, CAPILLARY
Glucose-Capillary: 186 mg/dL — ABNORMAL HIGH (ref 70–99)
Glucose-Capillary: 187 mg/dL — ABNORMAL HIGH (ref 70–99)

## 2019-06-25 LAB — BASIC METABOLIC PANEL
Anion gap: 11 (ref 5–15)
BUN: 46 mg/dL — ABNORMAL HIGH (ref 8–23)
CO2: 21 mmol/L — ABNORMAL LOW (ref 22–32)
Calcium: 8.6 mg/dL — ABNORMAL LOW (ref 8.9–10.3)
Chloride: 105 mmol/L (ref 98–111)
Creatinine, Ser: 2.29 mg/dL — ABNORMAL HIGH (ref 0.61–1.24)
GFR calc Af Amer: 31 mL/min — ABNORMAL LOW (ref >60)
GFR calc non Af Amer: 27 mL/min — ABNORMAL LOW (ref >60)
Glucose, Bld: 162 mg/dL — ABNORMAL HIGH (ref 70–99)
Potassium: 4.7 mmol/L (ref 3.5–5.1)
Sodium: 137 mmol/L (ref 135–145)

## 2019-06-25 LAB — C-REACTIVE PROTEIN
CRP: 17.1 mg/dL — ABNORMAL HIGH (ref <1.0)
CRP: 17.6 mg/dL — ABNORMAL HIGH (ref <1.0)

## 2019-06-25 LAB — HEMOGLOBIN A1C
Hgb A1c MFr Bld: 5.3 % (ref 4.8–5.6)
Mean Plasma Glucose: 105.41 mg/dL

## 2019-06-25 LAB — LACTIC ACID, PLASMA: Lactic Acid, Venous: 0.8 mmol/L (ref 0.5–1.9)

## 2019-06-25 LAB — FERRITIN: Ferritin: 139 ng/mL (ref 24–336)

## 2019-06-25 LAB — PROCALCITONIN: Procalcitonin: 0.74 ng/mL

## 2019-06-25 LAB — FIBRIN DERIVATIVES D-DIMER (ARMC ONLY): Fibrin derivatives D-dimer (ARMC): 780.81 ng/mL (FEU) — ABNORMAL HIGH (ref 0.00–499.00)

## 2019-06-25 MED ORDER — ACETAMINOPHEN 325 MG PO TABS
650.0000 mg | ORAL_TABLET | Freq: Four times a day (QID) | ORAL | Status: DC | PRN
  Administered 2019-06-28: 19:00:00 650 mg via ORAL
  Filled 2019-06-25: qty 2

## 2019-06-25 MED ORDER — INSULIN ASPART 100 UNIT/ML ~~LOC~~ SOLN
0.0000 [IU] | Freq: Every day | SUBCUTANEOUS | Status: DC
  Administered 2019-06-28: 2 [IU] via SUBCUTANEOUS
  Administered 2019-06-29 – 2019-06-30 (×2): 3 [IU] via SUBCUTANEOUS
  Filled 2019-06-25 (×3): qty 1

## 2019-06-25 MED ORDER — ENSURE ENLIVE PO LIQD
237.0000 mL | Freq: Two times a day (BID) | ORAL | Status: DC
  Administered 2019-06-25 – 2019-06-28 (×7): 237 mL via ORAL

## 2019-06-25 MED ORDER — RIVAROXABAN 20 MG PO TABS
20.0000 mg | ORAL_TABLET | Freq: Every day | ORAL | Status: DC
  Administered 2019-06-25 – 2019-06-30 (×6): 20 mg via ORAL
  Filled 2019-06-25 (×6): qty 1

## 2019-06-25 MED ORDER — INSULIN ASPART 100 UNIT/ML ~~LOC~~ SOLN
0.0000 [IU] | Freq: Three times a day (TID) | SUBCUTANEOUS | Status: DC
  Administered 2019-06-25: 3 [IU] via SUBCUTANEOUS
  Administered 2019-06-26 – 2019-06-27 (×5): 2 [IU] via SUBCUTANEOUS
  Administered 2019-06-27: 5 [IU] via SUBCUTANEOUS
  Administered 2019-06-28: 1 [IU] via SUBCUTANEOUS
  Administered 2019-06-28: 17:00:00 5 [IU] via SUBCUTANEOUS
  Administered 2019-06-28 – 2019-06-29 (×2): 2 [IU] via SUBCUTANEOUS
  Administered 2019-06-29 (×2): 3 [IU] via SUBCUTANEOUS
  Administered 2019-06-30: 8 [IU] via SUBCUTANEOUS
  Administered 2019-06-30: 3 [IU] via SUBCUTANEOUS
  Administered 2019-06-30: 8 [IU] via SUBCUTANEOUS
  Administered 2019-07-01: 2 [IU] via SUBCUTANEOUS
  Filled 2019-06-25 (×18): qty 1

## 2019-06-25 MED ORDER — SODIUM CHLORIDE 0.9 % IV SOLN
INTRAVENOUS | Status: DC | PRN
  Administered 2019-06-25 – 2019-06-26 (×2): 500 mL via INTRAVENOUS

## 2019-06-25 NOTE — Consult Note (Signed)
    Lymphedema  Resources (updated July 2019 ) Each site requires a referral from your primary care MD [place], [place]  [phone] (Upper extremities)  [place], [place] [phone] (Lower extremities)  [facility] [address] [place], [place] [address] [phone] [facility] [address] [place], [place] [address] [phone] [place], [address] Building [place], [place] [phone]  [facility] [address] [place], [place] [address] [phone]  [facility]  (only treatment for lymphedema related to cancer diagnosis) [place], [place] [address] [phone]    [facility] [address] [place], [place] [address] [phone]  [facility] [address] [place], [place] [address] [phone] [facility] [address],  [address] [phone]   Reason for Consult: lymphedema  Secure chat with MD; treatment for this condition is managed by outpatient therapist.  Unless patient has any topical therapy needed for wounds we would not see patient.  Requested images if any open wounds.   Re consult if needed, will not follow at this time. Thanks  Rayvon Brandvold [employer], RN,CWOCN, CNS, [place] [phone])

## 2019-06-25 NOTE — Progress Notes (Signed)
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

## 2019-06-25 NOTE — Evaluation (Signed)
Physical Therapy Evaluation Patient Details Name: Justin Shannon. MRN: [id number] DOB: 1940-06-28 Today's Date: 06/25/2019   History of Present Illness  Pt is a 79 year old male presenting with SOB secondary to COVID. Arrived to ED with dyspnea and O2 sat of 88%. PMH lower extremity DVT, DM, HTN, and CKD.  Clinical Impression  Pt presents with COVID related impairments of SOB, decreased endurance, decreased strength, decreased cardiorespiratory fitness, and decreased hip and trunk ROM. Activity limitations in transfers (currently heavy modA for supine > sit, and modA for sit >supine, minA with bedrails for rolling), unable to safely complete standing activities, including ambulation, sitting balance (heavy posterior lean in sitting EOB, able to tolerate 45mins). Patient currently unable to participate in ind ADLs safely and return to PLOF of modI with RW. Recommendation currently is SNF placement. PT advised patient to continue ankle pumps, heel slides in bed as long as he can tolerate to prevent DVT; verbalized understanding. Would benefit from skilled PT to address above deficits and promote optimal return to PLOF.     Follow Up Recommendations SNF    Equipment Recommendations  Rolling walker with 5" wheels    Recommendations for Other Services OT consult     Precautions / Restrictions Precautions Precautions: Fall Restrictions Weight Bearing Restrictions: No      Mobility  Bed Mobility Overal bed mobility: Needs Assistance Bed Mobility: Sit to Supine;Supine to Sit     Supine to sit: Mod assist Sit to supine: Mod assist   General bed mobility comments: Heavy modA for LE and trunk management with heavy posterior lean through transfer. Better management of trunk with bedrail for sit>supine with more assistance needed at BLE  Transfers Overall transfer level: Needs assistance               General transfer comment: Once EOB patient unable/unwilling to attempt STS, PT  agrees may not be safe this date  Ambulation/Gait             General Gait Details: deferred  Stairs            Wheelchair Mobility    Modified Rankin (Stroke Patients Only)       Balance                                             Pertinent Vitals/Pain      Home Living Family/patient expects to be discharged to:: Private residence Living Arrangements: Spouse/significant other;Other relatives Available Help at Discharge: Family Type of Home: House Home Access: Stairs to enter Entrance Stairs-Rails: Can reach both Entrance Stairs-Number of Steps: 4 Home Layout: One level Home Equipment: Environmental consultant - 2 wheels      Prior Function Level of Independence: Independent with assistive device(s)         Comments: Reports he has been using RW for household and community ambulation for 3-4 years     Hand Dominance        Extremity/Trunk Assessment   Upper Extremity Assessment Upper Extremity Assessment: Generalized weakness    Lower Extremity Assessment Lower Extremity Assessment: Generalized weakness    Cervical / Trunk Assessment Cervical / Trunk Assessment: Normal  Communication   Communication: No difficulties  Cognition Arousal/Alertness: Awake/alert Behavior During Therapy: WFL for tasks assessed/performed Overall Cognitive Status: Within Functional Limits for tasks assessed  General Comments: O x4      General Comments      Exercises General Exercises - Lower Extremity Ankle Circles/Pumps: AROM;Both;20 reps Long Arc Quad: Seated;AROM;10 reps;Both(2x 5) Heel Slides: AROM;5 reps;Both;Supine Straight Leg Raises: AROM;Both;5 reps;Supine Hip Flexion/Marching: Seated;AROM;10 reps;Both(2x 5) Other Exercises Other Exercises: Cued pt through supine to sit with ModA and bedrail assistance needed. Once EOB cued patient for hand placement for self stabilization which he is able to  complete and remain sitting 27mins to complete therex   Assessment/Plan    PT Assessment Patient needs continued PT services  PT Problem List Decreased strength;Decreased range of motion;Decreased activity tolerance;Decreased balance;Decreased mobility;Decreased coordination       PT Treatment Interventions DME instruction;Gait training;Neuromuscular re-education;Stair training;Functional mobility training;Therapeutic activities;Therapeutic exercise;Manual techniques;Patient/family education    PT Goals (Current goals can be found in the Care Plan section)  Acute Rehab PT Goals Patient Stated Goal: Go home PT Goal Formulation: With patient Time For Goal Achievement: 07/09/19 Potential to Achieve Goals: Fair    Frequency Min 2X/week   Barriers to discharge   oxygen    Co-evaluation               AM-PAC PT "6 Clicks" Mobility  Outcome Measure Help needed turning from your back to your side while in a flat bed without using bedrails?: A Little Help needed moving from lying on your back to sitting on the side of a flat bed without using bedrails?: A Lot Help needed moving to and from a bed to a chair (including a wheelchair)?: Total Help needed standing up from a chair using your arms (e.g., wheelchair or bedside chair)?: Total Help needed to walk in hospital room?: Total Help needed climbing 3-5 steps with a railing? : Total 6 Click Score: 9    End of Session Equipment Utilized During Treatment: Gait belt Activity Tolerance: Patient limited by fatigue Patient left: in bed Nurse Communication: Mobility status PT Visit Diagnosis: Unsteadiness on feet (R26.81);Difficulty in walking, not elsewhere classified (R26.2);Other abnormalities of gait and mobility (R26.89);Muscle weakness (generalized) (M62.81)    Time: 0929-1000 PT Time Calculation (min) (ACUTE ONLY): 31 min   Charges:   PT Evaluation $PT Eval Moderate Complexity: 1 Mod PT Treatments $Therapeutic Exercise:  8-22 mins $Therapeutic Activity: 8-22 mins        Shelton Silvas PT, DPT  Shelton Silvas 06/25/2019, 10:30 AM

## 2019-06-25 NOTE — ED Notes (Signed)
ED TO INPATIENT HANDOFF REPORT  ED Nurse Name and Phone #: Estill Batten [phone]  S Name/Age/Gender Justin Shannon. 79 y.o. male Room/Bed: ED33A/ED33A  Code Status   Code Status: Full Code  Home/SNF/Other Inpatient admission. Patient oriented to: self, place, time and situation Is this baseline? Yes   Triage Complete: Triage complete  Chief Complaint Pneumonia due to COVID-19 virus [U07.1, J12.82]  Triage Note Per EMS report, patient had a positive Covid test on 12/28. Several family members are positive. Patient states shortness of breath is worse today. Patient has harsh cough. Patient has dyspnea at rest, pulse ox 88% on room air. Blood sugar by EMS was 138.    Allergies Allergies  Allergen Reactions  . Shellfish Allergy Other (See Comments) and Anaphylaxis    Unknown     Level of Care/Admitting Diagnosis ED Disposition    ED Disposition Condition Comment   Admit  Hospital Area: [facility] [[id number]]  Level of Care: Med-Surg [16]  Covid Evaluation: Confirmed COVID Positive  Diagnosis: Pneumonia due to COVID-19 virus [[id number]]  Admitting Physician: Orene Desanctis [[id number]]  Attending Physician: Orene Desanctis [[id number]]  Estimated length of stay: past midnight tomorrow  Certification:: I certify this patient will need inpatient services for at least 2 midnights       B Medical/Surgery History Past Medical History:  Diagnosis Date  . Arthritis   . Bilateral swelling of feet   . BPH (benign prostatic hyperplasia)   . Chronic kidney disease   . Diabetes mellitus without complication ([employer])   . DVT (deep venous thrombosis) ([facility])    right lower extremity  . History of gout   . Hypertension   . Seizures ([place])   . Sleep apnea   . Tendonitis of foot    left   Past Surgical History:  Procedure Laterality Date  . APPENDECTOMY    . HERNIA REPAIR    . PROSTATE SURGERY       A IV Location/Drains/Wounds Patient Lines/Drains/Airways Status    Active Line/Drains/Airways    Name:   Placement date:   Placement time:   Site:   Days:   Peripheral IV 06/24/19 Right Antecubital   06/24/19    2015    Antecubital   1          Intake/Output Last 24 hours  Intake/Output Summary (Last 24 hours) at 06/25/2019 1947 Last data filed at 06/25/2019 1033 Gross per 24 hour  Intake 1200 ml  Output 300 ml  Net 900 ml    Labs/Imaging Results for orders placed or performed during the hospital encounter of 06/24/19 (from the past 48 hour(s))  Lactic acid, plasma     Status: None   Collection Time: 06/24/19  7:56 PM  Result Value Ref Range   Lactic Acid, Venous 1.1 0.5 - 1.9 mmol/L    Comment: Performed at [facility], [address] [place], [place] [address]  CBC WITH DIFFERENTIAL     Status: Abnormal   Collection Time: 06/24/19  7:56 PM  Result Value Ref Range   WBC 8.5 4.0 - 10.5 K/uL   RBC 3.95 (L) 4.22 - 5.81 MIL/uL   Hemoglobin 9.1 (L) 13.0 - 17.0 g/dL   HCT 26.8 (L) 39.0 - 52.0 %   MCV 67.8 (L) 80.0 - 100.0 fL   MCH 23.0 (L) 26.0 - 34.0 pg   MCHC 34.0 30.0 - 36.0 g/dL   RDW 17.2 (H) 11.5 - 15.5 %   Platelets 179  150 - 400 K/uL   nRBC 0.4 (H) 0.0 - 0.2 %   Neutrophils Relative % 83 %   Neutro Abs 7.1 1.7 - 7.7 K/uL   Lymphocytes Relative 9 %   Lymphs Abs 0.8 0.7 - 4.0 K/uL   Monocytes Relative 7 %   Monocytes Absolute 0.6 0.1 - 1.0 K/uL   Eosinophils Relative 0 %   Eosinophils Absolute 0.0 0.0 - 0.5 K/uL   Basophils Relative 0 %   Basophils Absolute 0.0 0.0 - 0.1 K/uL   Immature Granulocytes 1 %   Abs Immature Granulocytes 0.09 (H) 0.00 - 0.07 K/uL    Comment: Performed at [facility], [address] [place], [place] [address]  Fibrin derivatives D-Dimer     Status: Abnormal   Collection Time: 06/24/19  7:56 PM  Result Value Ref Range   Fibrin derivatives D-dimer ([facility]) 901.84 (H) 0.00 - 499.00 ng/mL (FEU)    Comment: (NOTE) <> Exclusion of Venous Thromboembolism (VTE) - OUTPATIENT ONLY    (Emergency Department or [place])   0-499 ng/ml (FEU): With a low to intermediate pretest probability                      for VTE this test result excludes the diagnosis                      of VTE.   >499 ng/ml (FEU) : VTE not excluded; additional work up for VTE is                      required. <> Testing on Inpatients and Evaluation of Disseminated Intravascular   Coagulation (DIC) Reference Range:   0-499 ng/ml (FEU) Performed at [facility], [address] [place], [place] [address]   Fibrinogen     Status: Abnormal   Collection Time: 06/24/19  7:56 PM  Result Value Ref Range   Fibrinogen 587 (H) 210 - 475 mg/dL    Comment: Performed at [facility], [address] [place], [place] [address]  C-reactive protein     Status: Abnormal   Collection Time: 06/24/19  7:56 PM  Result Value Ref Range   CRP 17.1 (H) <1.0 mg/dL    Comment: Performed at [facility] [address] [place], [place] [address]  ABO/Rh     Status: None   Collection Time: 06/24/19  7:56 PM  Result Value Ref Range   ABO/RH(D)      B POS Performed at [facility], [address] [place], [place] [address]   Blood Culture (routine x 2)     Status: None (Preliminary result)   Collection Time: 06/24/19  7:57 PM   Specimen: BLOOD  Result Value Ref Range   Specimen Description BLOOD RIGHT ANTECUBITAL    Special Requests      BOTTLES DRAWN AEROBIC AND ANAEROBIC Blood Culture adequate volume   Culture      NO GROWTH < 12 HOURS Performed at [facility], [address], [place], [place] [address]    Report Status PENDING   Blood Culture (routine x 2)     Status: None (Preliminary result)   Collection Time: 06/24/19  7:57 PM   Specimen: BLOOD  Result Value Ref Range   Specimen Description BLOOD RIGHT FOREARM    Special Requests      BOTTLES DRAWN AEROBIC AND ANAEROBIC Blood Culture adequate volume   Culture  NO GROWTH < 12 HOURS Performed at [facility], [address] [place], [place] [address]    Report Status PENDING   Comprehensive metabolic panel     Status: Abnormal   Collection Time: 06/24/19  9:10 PM  Result Value Ref Range   Sodium 135 135 - 145 mmol/L   Potassium 4.2 3.5 - 5.1 mmol/L   Chloride 106 98 - 111 mmol/L   CO2 20 (L) 22 - 32 mmol/L   Glucose, Bld 117 (H) 70 - 99 mg/dL   BUN 47 (H) 8 - 23 mg/dL   Creatinine, Ser 2.72 (H) 0.61 - 1.24 mg/dL   Calcium 8.7 (L) 8.9 - 10.3 mg/dL   Total Protein 6.9 6.5 - 8.1 g/dL   Albumin 3.1 (L) 3.5 - 5.0 g/dL   AST 47 (H) 15 - 41 U/L   ALT 25 0 - 44 U/L   Alkaline Phosphatase 52 38 - 126 U/L   Total Bilirubin 0.7 0.3 - 1.2 mg/dL   GFR calc non Af Amer 22 (L) >60 mL/min   GFR calc Af Amer 25 (L) >60 mL/min   Anion gap 9 5 - 15    Comment: Performed at [facility], [address], [place], [place] [address]  Ferritin     Status: None   Collection Time: 06/24/19  9:10 PM  Result Value Ref Range   Ferritin 107 24 - 336 ng/mL    Comment: Performed at [facility], [address] [place], [place] [address]  Lactate dehydrogenase     Status: Abnormal   Collection Time: 06/24/19  9:10 PM  Result Value Ref Range   LDH 231 (H) 98 - 192 U/L    Comment: Performed at [facility], [address] [place], [place] [address]  Procalcitonin     Status: None   Collection Time: 06/24/19  9:10 PM  Result Value Ref Range   Procalcitonin 0.53 ng/mL    Comment:        Interpretation: PCT > 0.5 ng/mL and <= 2 ng/mL: Systemic infection (sepsis) is possible, but other conditions are known to elevate PCT as well. (NOTE)       Sepsis PCT Algorithm           Lower Respiratory Tract                                      Infection PCT Algorithm    ----------------------------     ----------------------------         PCT < 0.25 ng/mL                PCT < 0.10 ng/mL         Strongly encourage             Strongly discourage   discontinuation of  antibiotics    initiation of antibiotics    ----------------------------     -----------------------------       PCT 0.25 - 0.50 ng/mL            PCT 0.10 - 0.25 ng/mL               OR       >80% decrease in PCT            Discourage initiation of  antibiotics      Encourage discontinuation           of antibiotics    ----------------------------     -----------------------------         PCT >= 0.50 ng/mL              PCT 0.26 - 0.50 ng/mL                AND       <80% decrease in PCT             Encourage initiation of                                             antibiotics       Encourage continuation           of antibiotics    ----------------------------     -----------------------------        PCT >= 0.50 ng/mL                  PCT > 0.50 ng/mL               AND         increase in PCT                  Strongly encourage                                      initiation of antibiotics    Strongly encourage escalation           of antibiotics                                     -----------------------------                                           PCT <= 0.25 ng/mL                                                 OR                                        > 80% decrease in PCT                                     Discontinue / Do not initiate                                             antibiotics Performed at [facility], [address], [place], [place] [address]   Triglycerides     Status: None   Collection Time: 06/24/19  9:10 PM  Result Value Ref Range   Triglycerides 66 <150 mg/dL    Comment: Performed at [facility], [address] [place], [place] [address]  C-reactive protein     Status: Abnormal   Collection Time: 06/25/19  6:07 AM  Result Value Ref Range   CRP 17.6 (H) <1.0 mg/dL    Comment: Performed at [facility] [address] [place], [place] [address]  Ferritin     Status: None    Collection Time: 06/25/19  6:07 AM  Result Value Ref Range   Ferritin 139 24 - 336 ng/mL    Comment: Performed at [facility], [address] [place], [place] [address]  Fibrin derivatives D-Dimer [facility] only)     Status: Abnormal   Collection Time: 06/25/19  6:07 AM  Result Value Ref Range   Fibrin derivatives D-dimer ([facility]) 780.81 (H) 0.00 - 499.00 ng/mL (FEU)    Comment: (NOTE) <> Exclusion of Venous Thromboembolism (VTE) - OUTPATIENT ONLY   (Emergency Department or [place])   0-499 ng/ml (FEU): With a low to intermediate pretest probability                      for VTE this test result excludes the diagnosis                      of VTE.   >499 ng/ml (FEU) : VTE not excluded; additional work up for VTE is                      required. <> Testing on Inpatients and Evaluation of Disseminated Intravascular   Coagulation (DIC) Reference Range:   0-499 ng/ml (FEU) Performed at [facility], [address] [place], [place] [address]   Basic metabolic panel     Status: Abnormal   Collection Time: 06/25/19  6:07 AM  Result Value Ref Range   Sodium 137 135 - 145 mmol/L   Potassium 4.7 3.5 - 5.1 mmol/L   Chloride 105 98 - 111 mmol/L   CO2 21 (L) 22 - 32 mmol/L   Glucose, Bld 162 (H) 70 - 99 mg/dL   BUN 46 (H) 8 - 23 mg/dL   Creatinine, Ser 2.29 (H) 0.61 - 1.24 mg/dL   Calcium 8.6 (L) 8.9 - 10.3 mg/dL   GFR calc non Af Amer 27 (L) >60 mL/min   GFR calc Af Amer 31 (L) >60 mL/min   Anion gap 11 5 - 15    Comment: Performed at [facility], [address] [place], [place] [address]  Lactic acid, plasma     Status: None   Collection Time: 06/25/19  6:17 AM  Result Value Ref Range   Lactic Acid, Venous 0.8 0.5 - 1.9 mmol/L    Comment: Performed at [facility], [address] [place], [place] [address]  Procalcitonin - Baseline     Status: None   Collection Time: 06/25/19  3:29 PM  Result Value Ref Range   Procalcitonin 0.74 ng/mL     Comment:        Interpretation: PCT > 0.5 ng/mL and <= 2 ng/mL: Systemic infection (sepsis) is possible, but other conditions are known to elevate PCT as well. (NOTE)       Sepsis PCT Algorithm           Lower Respiratory Tract  Infection PCT Algorithm    ----------------------------     ----------------------------         PCT < 0.25 ng/mL                PCT < 0.10 ng/mL         Strongly encourage             Strongly discourage   discontinuation of antibiotics    initiation of antibiotics    ----------------------------     -----------------------------       PCT 0.25 - 0.50 ng/mL            PCT 0.10 - 0.25 ng/mL               OR       >80% decrease in PCT            Discourage initiation of                                            antibiotics      Encourage discontinuation           of antibiotics    ----------------------------     -----------------------------         PCT >= 0.50 ng/mL              PCT 0.26 - 0.50 ng/mL                AND       <80% decrease in PCT             Encourage initiation of                                             antibiotics       Encourage continuation           of antibiotics    ----------------------------     -----------------------------        PCT >= 0.50 ng/mL                  PCT > 0.50 ng/mL               AND         increase in PCT                  Strongly encourage                                      initiation of antibiotics    Strongly encourage escalation           of antibiotics                                     -----------------------------                                           PCT <= 0.25 ng/mL  OR                                        > 80% decrease in PCT                                     Discontinue / Do not initiate                                             antibiotics Performed at [facility], [address] [place], [place] [address]   Hemoglobin A1c     Status: None   Collection Time: 06/25/19  3:29 PM  Result Value Ref Range   Hgb A1c MFr Bld 5.3 4.8 - 5.6 %    Comment: (NOTE) Pre diabetes:          5.7%-6.4% Diabetes:              >6.4% Glycemic control for   <7.0% adults with diabetes    Mean Plasma Glucose 105.41 mg/dL    Comment: Performed at [facility] [address], [place], [place] [address]  Glucose, capillary     Status: Abnormal   Collection Time: 06/25/19  6:13 PM  Result Value Ref Range   Glucose-Capillary 186 (H) 70 - 99 mg/dL   DG Chest Port 1 View  Result Date: 06/24/2019 CLINICAL DATA:  Shortness of breath, COVID EXAM: PORTABLE CHEST 1 VIEW COMPARISON:  08/28/2011 FINDINGS: The heart size and mediastinal contours are within normal limits. Heterogeneous airspace opacity bilaterally. The visualized skeletal structures are unremarkable. IMPRESSION: Heterogeneous airspace opacity bilaterally, consistent with multifocal infection and COVID-19. Electronically Signed   By: Eddie Candle M.D.   On: 06/24/2019 20:01    Pending Labs Unresulted Labs (From admission, onward)    Start     Ordered   06/26/19 0500  Procalcitonin  Daily,   STAT     06/25/19 1334   06/25/19 0500  C-reactive protein  Daily,   STAT     06/24/19 2136   06/25/19 0500  Ferritin  Daily,   STAT     06/24/19 2136   06/25/19 0500  Fibrin derivatives D-Dimer ([facility] only)  Daily,   STAT     06/24/19 2136          Vitals/Pain Today's Vitals   06/25/19 1303 06/25/19 1517 06/25/19 1712 06/25/19 1900  BP: 123/81 123/87 113/87 120/75  Pulse: 79 83 76 76  Resp: 20 20 16    Temp:      TempSrc:      SpO2: 93% 94% 95% 93%  Weight:      Height:      PainSc:        Isolation Precautions Airborne and Contact precautions  Medications Medications  dexamethasone (DECADRON) injection 6 mg (6 mg Intravenous Given 06/25/19 0050)  remdesivir 200 mg in sodium chloride 0.9% 250 mL IVPB (0 mg Intravenous  Stopped 06/25/19 0224)    Followed by  remdesivir 100 mg in sodium chloride 0.9 % 100 mL IVPB (0 mg Intravenous Stopped 06/25/19 1033)  levETIRAcetam (KEPPRA) tablet 500 mg (500 mg Oral Given 06/25/19 0955)  allopurinol (ZYLOPRIM) tablet 100 mg (100 mg  Oral Given 06/25/19 0954)  diltiazem (CARDIZEM CD) 24 hr capsule 240 mg (240 mg Oral Given 06/25/19 0954)  furosemide (LASIX) tablet 40 mg (40 mg Oral Given 06/25/19 0954)  metoprolol succinate (TOPROL-XL) 24 hr tablet 50 mg (50 mg Oral Given 06/25/19 0954)  docusate sodium (COLACE) capsule 100 mg (100 mg Oral Given 06/25/19 0954)  tamsulosin (FLOMAX) capsule 0.4 mg (0.4 mg Oral Given 06/25/19 0954)  gabapentin (NEURONTIN) capsule 100 mg (100 mg Oral Given 06/25/19 1528)  ascorbic acid (VITAMIN C) tablet 500 mg (500 mg Oral Given 06/25/19 0954)  cefTRIAXone (ROCEPHIN) 1 g in sodium chloride 0.9 % 100 mL IVPB (has no administration in time range)  azithromycin (ZITHROMAX) 500 mg in sodium chloride 0.9 % 250 mL IVPB (0 mg Intravenous Stopped 06/25/19 0110)  acetaminophen (TYLENOL) tablet 650 mg (has no administration in time range)  feeding supplement (ENSURE ENLIVE) (ENSURE ENLIVE) liquid 237 mL (237 mLs Oral Given 06/25/19 1418)  insulin aspart (novoLOG) injection 0-15 Units (3 Units Subcutaneous Given 06/25/19 1836)  insulin aspart (novoLOG) injection 0-5 Units (has no administration in time range)  rivaroxaban (XARELTO) tablet 20 mg (20 mg Oral Given 06/25/19 1834)  acetaminophen (TYLENOL) tablet 1,000 mg (1,000 mg Oral Given 06/24/19 1959)  dexamethasone (DECADRON) injection 10 mg (10 mg Intravenous Given 06/24/19 2059)  sodium chloride 0.9 % bolus 500 mL (0 mLs Intravenous Stopped 06/25/19 0224)    Mobility non-ambulatory Moderate fall risk   Focused Assessments Cardiac Assessment Handoff:  Cardiac Rhythm: Normal sinus rhythm, Sinus bradycardia No results found for: CKTOTAL, CKMB, CKMBINDEX, TROPONINI No results found for: DDIMER Does the Patient currently have  chest pain? No      R Recommendations: See Admitting Provider Note  Report given to: Levada Dy  Additional Notes:  Pt assigned to room 235. Report given and all questions answered.

## 2019-06-25 NOTE — ED Notes (Signed)
Received call from the patients daughter requesting and update. She expressed her frustration that she had not been called and provided updates through the night. This nurse attempted to remind her that it had been agreed that an update would be given if there was a significant change in status or the patient was moved to a floor bed, neither of which had occurred. The patient continues to rest comfortably while awaiting a floor bed.

## 2019-06-25 NOTE — Progress Notes (Signed)
PROGRESS NOTE    [facility].  ALP:[id number] DOB: Oct 01, 1940 DOA: 06/24/2019 PCP: Kirk Ruths, MD    Brief Narrative:  Justin Shannon. is a 79 y.o. 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  Patient recently tested positive at [facility] for Covid on 12/28.  Since then he has noticed increasing cough productive of sputum as well as shortness of breath.  He suffers from chronic knee pain and normally ambulates with a cane but has been having increasing weakness as well.  Notes a temperature of 100.7 at home.  Wife at home is also positive for Covid. He denies any tobacco, alcohol illicit drug use.    Assessment & Plan:   Principal Problem:   Pneumonia due to COVID-19 virus Active Problems:   Epilepsy ([place])   History of DVT (deep vein thrombosis)   OSA on CPAP   Lymphedema   Type 2 diabetes mellitus with stage 4 chronic kidney disease, without long-term current use of insulin (HCC)   Respiratory failure with hypoxia (HCC)   Anemia due to stage 4 chronic kidney disease (HCC)   Essential hypertension   Generalized weakness  Acute hypoxic respiratory failure due to COVID possible superimposed bacterial pneumonia 2L on admit Maintain oxygen saturation > 92% IV decadron daily IV Remdesvir  Will continue Rocephin and Azithromycin given elevated PCT. Follow serial PCT.  Follow inflammatory markers COVID isolation precautions  Generalized Weakness Suspect this is acute on chronic from deconditioning Ambulates with cane at baseline PT eval  Chronic Anemia  Hgb 9.1 on admit.  Previous hbg from outside lab was 8.7 on 02/2019 which appears to be around his baseline continue follow up outpatient  History of epilepsy Continue Keppra  Acute on Chronic kidney  disease stage IV Baseline creatinine around 2-2.5 Admit 2.7 500cc bolus  Avoid nephrotoxic agent  Improving, approaching baseline  Type 2 diabetes Last HbA1C of 6.7 on 04/2019 Add SSI while on steroids  History of DVT of the lower extremity Continue Xarelto  Hypertension Continue diltiazem, metoprolol and lasix Hold olmesartan -HCTZ due to AKI   Lymphedema Continue compression stockings  OSA Will hold off on CPAP to decrease aerosolizing Covid infection   DVT prophylaxis: Xarelto Code Status: Full Family Communication: None  disposition Plan: Home   Consultants:   None  Procedures:   None  Antimicrobials:   Remdesivir (06/24/19- )  Rocephin (06/24/19- )  Azithromycin(06/24/19- )   Subjective: Seen and examined No acute interval status changes No new complaints Objective: Vitals:   06/25/19 0900 06/25/19 0930 06/25/19 0945 06/25/19 1303  BP: (!) 139/94   123/81  Pulse: (!) 58 65 78 79  Resp: (!) 21 13 20 20   Temp:      TempSrc:      SpO2: 94% 99% 99% 93%  Weight:      Height:        Intake/Output Summary (Last 24 hours) at 06/25/2019 1332 Last data filed at 06/25/2019 1033 Gross per 24 hour  Intake 1200 ml  Output 300 ml  Net 900 ml   Filed Weights   06/24/19 1938  Weight: 127 kg    Examination:  General exam: Appears calm and comfortable  Respiratory system: Clear to auscultation. Respiratory effort normal. Cardiovascular system: S1 & S2 heard, RRR. No JVD, murmurs, rubs,  gallops or clicks. No pedal edema. Gastrointestinal system: Abdomen is nondistended, soft and nontender. No organomegaly or masses felt. Normal bowel sounds heard. Central nervous system: Alert and oriented. No focal neurological deficits. Extremities: Symmetric 5 x 5 power. Skin: No rashes, lesions or ulcers Psychiatry: Judgement and insight appear normal. Mood & affect appropriate.     Data Reviewed: I have personally reviewed following labs and imaging  studies  CBC: Recent Labs  Lab 06/24/19 1956  WBC 8.5  NEUTROABS 7.1  HGB 9.1*  HCT 26.8*  MCV 67.8*  PLT 093   Basic Metabolic Panel: Recent Labs  Lab 06/24/19 2110 06/25/19 0607  NA 135 137  K 4.2 4.7  CL 106 105  CO2 20* 21*  GLUCOSE 117* 162*  BUN 47* 46*  CREATININE 2.72* 2.29*  CALCIUM 8.7* 8.6*   GFR: Estimated Creatinine Clearance: 36.1 mL/min (A) (by C-G formula based on SCr of 2.29 mg/dL (H)). Liver Function Tests: Recent Labs  Lab 06/24/19 2110  AST 47*  ALT 25  ALKPHOS 52  BILITOT 0.7  PROT 6.9  ALBUMIN 3.1*   No results for input(s): LIPASE, AMYLASE in the last 168 hours. No results for input(s): AMMONIA in the last 168 hours. Coagulation Profile: No results for input(s): INR, PROTIME in the last 168 hours. Cardiac Enzymes: No results for input(s): CKTOTAL, CKMB, CKMBINDEX, TROPONINI in the last 168 hours. BNP (last 3 results) No results for input(s): PROBNP in the last 8760 hours. HbA1C: No results for input(s): HGBA1C in the last 72 hours. CBG: No results for input(s): GLUCAP in the last 168 hours. Lipid Profile: Recent Labs    06/24/19 2110  TRIG 66   Thyroid Function Tests: No results for input(s): TSH, T4TOTAL, FREET4, T3FREE, THYROIDAB in the last 72 hours. Anemia Panel: Recent Labs    06/24/19 2110 06/25/19 0607  FERRITIN 107 139   Sepsis Labs: Recent Labs  Lab 06/24/19 1956 06/24/19 2110 06/25/19 0617  PROCALCITON  --  0.53  --   LATICACIDVEN 1.1  --  0.8    Recent Results (from the past 240 hour(s))  Blood Culture (routine x 2)     Status: None (Preliminary result)   Collection Time: 06/24/19  7:57 PM   Specimen: BLOOD  Result Value Ref Range Status   Specimen Description BLOOD RIGHT ANTECUBITAL  Final   Special Requests   Final    BOTTLES DRAWN AEROBIC AND ANAEROBIC Blood Culture adequate volume   Culture   Final    NO GROWTH < 12 HOURS Performed at [facility], [address], [place],  [place] [address]    Report Status PENDING  Incomplete  Blood Culture (routine x 2)     Status: None (Preliminary result)   Collection Time: 06/24/19  7:57 PM   Specimen: BLOOD  Result Value Ref Range Status   Specimen Description BLOOD RIGHT FOREARM  Final   Special Requests   Final    BOTTLES DRAWN AEROBIC AND ANAEROBIC Blood Culture adequate volume   Culture   Final    NO GROWTH < 12 HOURS Performed at [facility], [address], [place], [place] [address]    Report Status PENDING  Incomplete         Radiology Studies: DG Chest Port 1 View  Result Date: 06/24/2019 CLINICAL DATA:  Shortness of breath, COVID EXAM: PORTABLE CHEST 1 VIEW COMPARISON:  08/28/2011 FINDINGS: The heart size and mediastinal contours are within normal limits. Heterogeneous airspace opacity bilaterally. The  visualized skeletal structures are unremarkable. IMPRESSION: Heterogeneous airspace opacity bilaterally, consistent with multifocal infection and COVID-19. Electronically Signed   By: Eddie Candle M.D.   On: 06/24/2019 20:01        Scheduled Meds: . allopurinol  100 mg Oral Daily  . vitamin C  500 mg Oral Daily  . dexamethasone (DECADRON) injection  6 mg Intravenous Q24H  . diltiazem  240 mg Oral Daily  . docusate sodium  100 mg Oral Daily  . feeding supplement (ENSURE ENLIVE)  237 mL Oral BID BM  . furosemide  40 mg Oral QODAY  . gabapentin  100 mg Oral TID  . levETIRAcetam  500 mg Oral BID  . metoprolol succinate  50 mg Oral Daily  . Rivaroxaban  15 mg Oral Q supper  . tamsulosin  0.4 mg Oral QPC breakfast   Continuous Infusions: . azithromycin Stopped (06/25/19 0110)  . cefTRIAXone (ROCEPHIN)  IV    . remdesivir 100 mg in NS 100 mL Stopped (06/25/19 1033)     LOS: 1 day    Time spent: 35 minutes    Sidney Ace, MD [facility] Pager [phone]  If 7PM-7AM, please contact night-coverage [email or web address] Password [email or web address] 06/25/2019, 1:32 PM

## 2019-06-25 NOTE — ED Notes (Signed)
Received call from patients daughter and provided update after receiving verbal consent from patient. This nurse agreed to update her if there was a significant change in status or if the patient was moved from the ED to one of the units.

## 2019-06-25 NOTE — ED Notes (Signed)
Pt provided lunch tray, sitting up in bed to eat. Waiting on ensure.

## 2019-06-25 NOTE — ED Notes (Signed)
PT at bedside. Will administer meds when finished.

## 2019-06-25 NOTE — ED Notes (Signed)
Pts daughter called at patient request with list of items he would like delivered to the hospital. She was instructed to bring them to the front desk in the ED for the items to be delivered to his room.

## 2019-06-25 NOTE — ED Notes (Signed)
MD turned pt oxygen down to 3 L [place], will continue to monitor.

## 2019-06-25 NOTE — ED Notes (Signed)
Pt given phone to speak with daughter

## 2019-06-25 NOTE — ED Notes (Signed)
Called dietary to get ensure sent to ER at this time.

## 2019-06-25 NOTE — ED Notes (Signed)
Breakfast tray placed at bedside. Pt asleep at this time.

## 2019-06-26 ENCOUNTER — Encounter: Payer: Self-pay | Admitting: Family Medicine

## 2019-06-26 LAB — GLUCOSE, CAPILLARY
Glucose-Capillary: 142 mg/dL — ABNORMAL HIGH (ref 70–99)
Glucose-Capillary: 145 mg/dL — ABNORMAL HIGH (ref 70–99)
Glucose-Capillary: 123 mg/dL — ABNORMAL HIGH (ref 70–99)
Glucose-Capillary: 154 mg/dL — ABNORMAL HIGH (ref 70–99)

## 2019-06-26 LAB — C-REACTIVE PROTEIN: CRP: 14.3 mg/dL — ABNORMAL HIGH (ref <1.0)

## 2019-06-26 LAB — FERRITIN: Ferritin: 167 ng/mL (ref 24–336)

## 2019-06-26 LAB — PROCALCITONIN: Procalcitonin: 0.7 ng/mL

## 2019-06-26 LAB — D-DIMER, QUANTITATIVE: D-Dimer, Quant: 0.52 ug/mL-FEU — ABNORMAL HIGH (ref 0.00–0.50)

## 2019-06-26 MED ORDER — SODIUM CHLORIDE 0.9% FLUSH
3.0000 mL | Freq: Two times a day (BID) | INTRAVENOUS | Status: DC
  Administered 2019-06-26 – 2019-06-30 (×9): 3 mL via INTRAVENOUS

## 2019-06-26 NOTE — Progress Notes (Signed)
End of Shift Summary:  Date: 06/25/2018 Shift: 0700-1900 Significant Events: See previous note; no significant events this shift.

## 2019-06-26 NOTE — Progress Notes (Signed)
Writer spoke with patient's daughter [name] this morning and gave update. Daughter states patient's unna boots have not been changed since 12/22 and he typically gets them changed 1x/week at outpatient vascular. Spoke with Dr. Priscella Mann who will consult wound again tomorrow; will leave current unna boots in place today. Patient has moderate palpable bilateral pedal pulses.

## 2019-06-26 NOTE — Progress Notes (Addendum)
PROGRESS NOTE    [facility].  ZOX:[id number] DOB: 1940-09-26 DOA: 06/24/2019 PCP: Kirk Ruths, MD    Brief Narrative:  Keron Neenan. is a 79 y.o. 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  Patient recently tested positive at [facility] for Covid on 12/28.  Since then he has noticed increasing cough productive of sputum as well as shortness of breath.  He suffers from chronic knee pain and normally ambulates with a cane but has been having increasing weakness as well.  Notes a temperature of 100.7 at home.  Wife at home is also positive for Covid. He denies any tobacco, alcohol illicit drug use.  1/3: Patient report symptomatic improvement over interval.  On 3L [place]    Assessment & Plan:   Principal Problem:   Pneumonia due to COVID-19 virus Active Problems:   Epilepsy ([place])   History of DVT (deep vein thrombosis)   OSA on CPAP   Lymphedema   Type 2 diabetes mellitus with stage 4 chronic kidney disease, without long-term current use of insulin (HCC)   Respiratory failure with hypoxia (HCC)   Anemia due to stage 4 chronic kidney disease (HCC)   Essential hypertension   Generalized weakness  Acute hypoxic respiratory failure due to COVID possible superimposed bacterial pneumonia 2L on admit Maintain oxygen saturation > 92% IV decadron daily (day 2/10) IV Remdesvir (day 2/5) Will continue Rocephin and Azithromycin given elevated PCT. Follow serial PCT.  Follow inflammatory markers COVID isolation precautions  Generalized Weakness Suspect this is acute on chronic from deconditioning Ambulates with cane at baseline PT eval  Chronic Anemia  Hgb 9.1 on admit.  Previous hbg from outside lab was 8.7 on 02/2019 which appears to be around his baseline continue  follow up outpatient  History of epilepsy Continue Keppra  Acute on Chronic kidney disease stage IV Baseline creatinine around 2-2.5 Admit 2.7 500cc bolus  Avoid nephrotoxic agent  Improving, approaching baseline  Type 2 diabetes Last HbA1C of 6.7 on 04/2019 Add SSI while on steroids  History of DVT of the lower extremity Continue Xarelto  Hypertension Continue diltiazem, metoprolol and lasix Hold olmesartan -HCTZ due to AKI   Lymphedema Continue compression stockings  OSA Will hold off on CPAP to decrease aerosolizing Covid infection   DVT prophylaxis: Xarelto Code Status: Full Family Communication: Daughter [name] [phone] on 1/3 disposition Plan: Home   Consultants:   None  Procedures:   None  Antimicrobials:   Remdesivir (06/24/19- )  Rocephin (06/24/19- )  Azithromycin(06/24/19- )   Subjective: Seen and examined No acute interval status changes No new complaints Objective: Vitals:   06/25/19 2039 06/26/19 0349 06/26/19 0813 06/26/19 1053  BP: 121/61 116/75 117/67   Pulse: 77 (!) 56 (!) 58 64  Resp: (!) 32 20 18   Temp: 98 F (36.7 C) 98 F (36.7 C) 97.7 F (36.5 C)   TempSrc: Oral Oral Oral   SpO2: 96% 100% 100%   Weight: 128.7 kg     Height:        Intake/Output Summary (Last 24 hours) at 06/26/2019 1259 Last data filed at 06/26/2019 0600 Gross per 24 hour  Intake 423.32 ml  Output 550 ml  Net -126.68 ml   Filed Weights   06/24/19 1938 06/25/19 2039  Weight: 127 kg 128.7  kg    Examination:  General exam: Appears calm and comfortable  Respiratory system: Scattered crackles bilaterally.  Normal WOB Cardiovascular system: S1 & S2 heard, RRR. No JVD, murmurs, rubs, gallops or clicks. No pedal edema. Gastrointestinal system: Abdomen is nondistended, soft and nontender. No organomegaly or masses felt. Normal bowel sounds heard. Central nervous system: Alert and oriented. No focal neurological deficits. Extremities:  Symmetric 5 x 5 power. Skin: No rashes, lesions or ulcers Psychiatry: Judgement and insight appear normal. Mood & affect appropriate.     Data Reviewed: I have personally reviewed following labs and imaging studies  CBC: Recent Labs  Lab 06/24/19 1956  WBC 8.5  NEUTROABS 7.1  HGB 9.1*  HCT 26.8*  MCV 67.8*  PLT 967   Basic Metabolic Panel: Recent Labs  Lab 06/24/19 2110 06/25/19 0607  NA 135 137  K 4.2 4.7  CL 106 105  CO2 20* 21*  GLUCOSE 117* 162*  BUN 47* 46*  CREATININE 2.72* 2.29*  CALCIUM 8.7* 8.6*   GFR: Estimated Creatinine Clearance: 36.4 mL/min (A) (by C-G formula based on SCr of 2.29 mg/dL (H)). Liver Function Tests: Recent Labs  Lab 06/24/19 2110  AST 47*  ALT 25  ALKPHOS 52  BILITOT 0.7  PROT 6.9  ALBUMIN 3.1*   No results for input(s): LIPASE, AMYLASE in the last 168 hours. No results for input(s): AMMONIA in the last 168 hours. Coagulation Profile: No results for input(s): INR, PROTIME in the last 168 hours. Cardiac Enzymes: No results for input(s): CKTOTAL, CKMB, CKMBINDEX, TROPONINI in the last 168 hours. BNP (last 3 results) No results for input(s): PROBNP in the last 8760 hours. HbA1C: Recent Labs    06/25/19 1529  HGBA1C 5.3   CBG: Recent Labs  Lab 06/25/19 1813 06/25/19 2154 06/26/19 0814 06/26/19 1139  GLUCAP 186* 187* 142* 145*   Lipid Profile: Recent Labs    06/24/19 2110  TRIG 66   Thyroid Function Tests: No results for input(s): TSH, T4TOTAL, FREET4, T3FREE, THYROIDAB in the last 72 hours. Anemia Panel: Recent Labs    06/25/19 0607 06/26/19 0446  FERRITIN 139 167   Sepsis Labs: Recent Labs  Lab 06/24/19 1956 06/24/19 2110 06/25/19 0617 06/25/19 1529 06/26/19 0446  PROCALCITON  --  0.53  --  0.74 0.70  LATICACIDVEN 1.1  --  0.8  --   --     Recent Results (from the past 240 hour(s))  Blood Culture (routine x 2)     Status: None (Preliminary result)   Collection Time: 06/24/19  7:57 PM    Specimen: BLOOD  Result Value Ref Range Status   Specimen Description BLOOD RIGHT ANTECUBITAL  Final   Special Requests   Final    BOTTLES DRAWN AEROBIC AND ANAEROBIC Blood Culture adequate volume   Culture   Final    NO GROWTH 2 DAYS Performed at [facility], [address] [place], [place] [address]    Report Status PENDING  Incomplete  Blood Culture (routine x 2)     Status: None (Preliminary result)   Collection Time: 06/24/19  7:57 PM   Specimen: BLOOD  Result Value Ref Range Status   Specimen Description BLOOD RIGHT FOREARM  Final   Special Requests   Final    BOTTLES DRAWN AEROBIC AND ANAEROBIC Blood Culture adequate volume   Culture   Final    NO GROWTH 2 DAYS Performed at [facility], [address], [place], [place] [address]    Report  Status PENDING  Incomplete         Radiology Studies: DG Chest Port 1 View  Result Date: 06/24/2019 CLINICAL DATA:  Shortness of breath, COVID EXAM: PORTABLE CHEST 1 VIEW COMPARISON:  08/28/2011 FINDINGS: The heart size and mediastinal contours are within normal limits. Heterogeneous airspace opacity bilaterally. The visualized skeletal structures are unremarkable. IMPRESSION: Heterogeneous airspace opacity bilaterally, consistent with multifocal infection and COVID-19. Electronically Signed   By: Eddie Candle M.D.   On: 06/24/2019 20:01        Scheduled Meds: . allopurinol  100 mg Oral Daily  . vitamin C  500 mg Oral Daily  . dexamethasone (DECADRON) injection  6 mg Intravenous Q24H  . diltiazem  240 mg Oral Daily  . docusate sodium  100 mg Oral Daily  . feeding supplement (ENSURE ENLIVE)  237 mL Oral BID BM  . furosemide  40 mg Oral QODAY  . gabapentin  100 mg Oral TID  . insulin aspart  0-15 Units Subcutaneous TID WC  . insulin aspart  0-5 Units Subcutaneous QHS  . levETIRAcetam  500 mg Oral BID  . metoprolol succinate  50 mg Oral Daily  . Rivaroxaban  20 mg Oral Q supper  . tamsulosin  0.4 mg Oral  QPC breakfast   Continuous Infusions: . sodium chloride 10 mL/hr at 06/26/19 0600  . azithromycin Stopped (06/26/19 0124)  . cefTRIAXone (ROCEPHIN)  IV Stopped (06/25/19 2141)  . remdesivir 100 mg in NS 100 mL 100 mg (06/26/19 1053)     LOS: 2 days    Time spent: 35 minutes    Sidney Ace, MD [facility] Pager [phone]  If 7PM-7AM, please contact night-coverage [email or web address] Password [facility] 06/26/2019, 12:59 PM

## 2019-06-26 NOTE — Progress Notes (Signed)
Incentive spirometer and flutter valve given to patient; patient educated on use. Initial incentive spirometer reading at 1000. Patient instructed to use every hour.

## 2019-06-27 LAB — BASIC METABOLIC PANEL
Anion gap: 8 (ref 5–15)
BUN: 55 mg/dL — ABNORMAL HIGH (ref 8–23)
CO2: 22 mmol/L (ref 22–32)
Calcium: 8.5 mg/dL — ABNORMAL LOW (ref 8.9–10.3)
Chloride: 107 mmol/L (ref 98–111)
Creatinine, Ser: 1.94 mg/dL — ABNORMAL HIGH (ref 0.61–1.24)
GFR calc Af Amer: 38 mL/min — ABNORMAL LOW (ref >60)
GFR calc non Af Amer: 32 mL/min — ABNORMAL LOW (ref >60)
Glucose, Bld: 147 mg/dL — ABNORMAL HIGH (ref 70–99)
Potassium: 4.9 mmol/L (ref 3.5–5.1)
Sodium: 137 mmol/L (ref 135–145)

## 2019-06-27 LAB — CBC WITH DIFFERENTIAL/PLATELET
Abs Immature Granulocytes: 0.05 10*3/uL (ref 0.00–0.07)
Basophils Absolute: 0 10*3/uL (ref 0.0–0.1)
Basophils Relative: 0 %
Eosinophils Absolute: 0 10*3/uL (ref 0.0–0.5)
Eosinophils Relative: 0 %
HCT: 25.6 % — ABNORMAL LOW (ref 39.0–52.0)
Hemoglobin: 8.7 g/dL — ABNORMAL LOW (ref 13.0–17.0)
Immature Granulocytes: 1 %
Lymphocytes Relative: 8 %
Lymphs Abs: 0.7 10*3/uL (ref 0.7–4.0)
MCH: 22.3 pg — ABNORMAL LOW (ref 26.0–34.0)
MCHC: 34 g/dL (ref 30.0–36.0)
MCV: 65.5 fL — ABNORMAL LOW (ref 80.0–100.0)
Monocytes Absolute: 0.3 10*3/uL (ref 0.1–1.0)
Monocytes Relative: 4 %
Neutro Abs: 8 10*3/uL — ABNORMAL HIGH (ref 1.7–7.7)
Neutrophils Relative %: 87 %
Platelets: 179 10*3/uL (ref 150–400)
RBC: 3.91 MIL/uL — ABNORMAL LOW (ref 4.22–5.81)
RDW: 17.3 % — ABNORMAL HIGH (ref 11.5–15.5)
Smear Review: NORMAL
WBC: 9.1 10*3/uL (ref 4.0–10.5)
nRBC: 0.7 % — ABNORMAL HIGH (ref 0.0–0.2)

## 2019-06-27 LAB — FERRITIN: Ferritin: 223 ng/mL (ref 24–336)

## 2019-06-27 LAB — GLUCOSE, CAPILLARY
Glucose-Capillary: 136 mg/dL — ABNORMAL HIGH (ref 70–99)
Glucose-Capillary: 203 mg/dL — ABNORMAL HIGH (ref 70–99)
Glucose-Capillary: 144 mg/dL — ABNORMAL HIGH (ref 70–99)
Glucose-Capillary: 132 mg/dL — ABNORMAL HIGH (ref 70–99)

## 2019-06-27 LAB — D-DIMER, QUANTITATIVE: D-Dimer, Quant: 0.42 ug/mL-FEU (ref 0.00–0.50)

## 2019-06-27 LAB — PROCALCITONIN: Procalcitonin: 0.53 ng/mL

## 2019-06-27 LAB — C-REACTIVE PROTEIN: CRP: 8.7 mg/dL — ABNORMAL HIGH (ref <1.0)

## 2019-06-27 MED ORDER — AZITHROMYCIN 250 MG PO TABS
500.0000 mg | ORAL_TABLET | Freq: Every day | ORAL | Status: AC
  Administered 2019-06-27 – 2019-06-28 (×2): 500 mg via ORAL
  Filled 2019-06-27 (×2): qty 2

## 2019-06-27 NOTE — Consult Note (Addendum)
[place] Nurse Consult Note: Reason for Consult: Pt is in isolation for Covid.  Requested to reapply compression wraps.  Was unsure of what type of compression wraps patient was wearing before applying isolation gear, so I brought in 2 packages of Coban light.  Wound type: Removed previous compression wraps to assess bilat legs.  He was wearing Una boots and coban to control edema.  There are no open wounds or weeping.  Large amt dry crusted peeling skin. Washed legs and applied Coban light compression layer, then coban outer layer.  Pt can resume followup with his previus clinic or home health agency after discharge for weekly changes Q Mon. Please re-consult if further assistance is needed.  Thank-you,  Julien Girt MSN, [place], [place], [place], [place]

## 2019-06-27 NOTE — Progress Notes (Signed)
Physical Therapy Treatment Patient Details Name: Justin Shannon. MRN: [id number] DOB: 16-Sep-1940 Today's Date: 06/27/2019    History of Present Illness Pt is a 79 year old male presenting with SOB secondary to [place]. Arrived to ED with dyspnea and O2 sat of 88%. PMH lower extremity DVT, DM, HTN, and CKD.    PT Comments    Pt is making gradual progress towards goals, however does require +2 assist for OOB mobility and fatigues quickly. Post leaning noted while seated at EOB. Able to perform IS while at bedside. Good endurance with there-ex, although unable to formally measure O2 sats. Currently isn't at baseline level, not safe to dc home this day. Will continue to progress as able.   Follow Up Recommendations  SNF     Equipment Recommendations  Rolling walker with 5" wheels    Recommendations for Other Services       Precautions / Restrictions Precautions Precautions: Fall Restrictions Weight Bearing Restrictions: No    Mobility  Bed Mobility Overal bed mobility: Needs Assistance Bed Mobility: Sit to Supine;Supine to Sit     Supine to sit: Mod assist Sit to supine: Mod assist   General bed mobility comments: needs assist to slide B LEs off EOB. Once seated at EOB, heavy post leaning noted. Needs constant min assist progressing to cga to maintain posture.  Transfers Overall transfer level: Needs assistance Equipment used: Rolling walker (2 wheeled) Transfers: Sit to/from Stand Sit to Stand: Mod assist         General transfer comment: Bed elevated for ease of transfer. +2 required to stand up at bedside. Upright posture noted  Ambulation/Gait Ambulation/Gait assistance: Min assist;+2 physical assistance Gait Distance (Feet): 3 Feet Assistive device: Rolling walker (2 wheeled) Gait Pattern/deviations: Step-to pattern     General Gait Details: able to take a few side steps at bedside with cues for sequencing of RW. Fatigues quickly. All mobility performed on  RA.   Stairs             Wheelchair Mobility    Modified Rankin (Stroke Patients Only)       Balance Overall balance assessment: Needs assistance;History of Falls Sitting-balance support: Feet supported;Bilateral upper extremity supported Sitting balance-Leahy Scale: Poor Sitting balance - Comments: post leaning   Standing balance support: Bilateral upper extremity supported Standing balance-Leahy Scale: Fair                              Cognition Arousal/Alertness: Awake/alert Behavior During Therapy: WFL for tasks assessed/performed Overall Cognitive Status: Within Functional Limits for tasks assessed                                 General Comments: O x4      Exercises Other Exercises Other Exercises: supine ther-ex performed on B LE including AP, SLRs, hip abd/add, and seated forward reaching with R UE, (unable with L). All ther0ex performed x 10 reps with min/mod assist.    General Comments General comments (skin integrity, edema, etc.): all mobility performed on RA, however no pulse ox available to measure. No SOB symptoms although pt does fatigue quickly      Pertinent Vitals/Pain Pain Assessment: Faces Faces Pain Scale: Hurts little more Pain Location: L elbow, B knees Pain Descriptors / Indicators: Aching Pain Intervention(s): Limited activity within patient's tolerance    Home Living  Prior Function            PT Goals (current goals can now be found in the care plan section) Acute Rehab PT Goals Patient Stated Goal: Go home PT Goal Formulation: With patient Time For Goal Achievement: 07/09/19 Potential to Achieve Goals: Fair Progress towards PT goals: Progressing toward goals    Frequency    Min 2X/week      PT Plan Current plan remains appropriate    Co-evaluation              AM-PAC PT "6 Clicks" Mobility   Outcome Measure  Help needed turning from your back to  your side while in a flat bed without using bedrails?: A Little Help needed moving from lying on your back to sitting on the side of a flat bed without using bedrails?: A Lot Help needed moving to and from a bed to a chair (including a wheelchair)?: A Lot Help needed standing up from a chair using your arms (e.g., wheelchair or bedside chair)?: A Lot Help needed to walk in hospital room?: A Lot Help needed climbing 3-5 steps with a railing? : Total 6 Click Score: 12    End of Session Equipment Utilized During Treatment: Gait belt Activity Tolerance: Patient limited by fatigue Patient left: in bed;with bed alarm set;with nursing/sitter in room Nurse Communication: Mobility status PT Visit Diagnosis: Unsteadiness on feet (R26.81);Difficulty in walking, not elsewhere classified (R26.2);Other abnormalities of gait and mobility (R26.89);Muscle weakness (generalized) (M62.81)     Time: 9024-0973 PT Time Calculation (min) (ACUTE ONLY): 40 min  Charges:  $Gait Training: 8-22 mins $Therapeutic Exercise: 23-37 mins                     Justin Shannon, [place], DPT [phone]    Justin Shannon 06/27/2019, 5:01 PM

## 2019-06-27 NOTE — Progress Notes (Signed)
PROGRESS NOTE    [facility]  NUU:[id number] DOB: February 14, 1941 DOA: 06/24/2019 PCP: Kirk Ruths, MD    Brief Narrative:  Justin Shannon. is a 79 y.o. 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  Patient recently tested positive at [facility] for Covid on 12/28.  Since then he has noticed increasing cough productive of sputum as well as shortness of breath.  He suffers from chronic knee pain and normally ambulates with a cane but has been having increasing weakness as well.  Notes a temperature of 100.7 at home.  Wife at home is also positive for Covid. He denies any tobacco, alcohol illicit drug use.  1/3: Patient report symptomatic improvement over interval.  On 3L [place]  1/4: Symptomatic improvement over interval.  Patient feels well overall.  On 3 L supplemental oxygen this morning.  Nurse able to titrate off.    Assessment & Plan:   Principal Problem:   Pneumonia due to COVID-19 virus Active Problems:   Epilepsy ([place])   History of DVT (deep vein thrombosis)   OSA on CPAP   Lymphedema   Type 2 diabetes mellitus with stage 4 chronic kidney disease, without long-term current use of insulin (HCC)   Respiratory failure with hypoxia (HCC)   Anemia due to stage 4 chronic kidney disease (HCC)   Essential hypertension   Generalized weakness  Acute hypoxic respiratory failure due to COVID (improving) possible superimposed bacterial pneumonia 2L on admit Maintain oxygen saturation > 92% IV decadron daily (day 3/10) IV Remdesvir (day 3/5) Will continue Rocephin and Azithromycin given elevated PCT.  Follow serial PCT.  Follow inflammatory markers COVID isolation precautions  Generalized Weakness Suspect this is acute on chronic from deconditioning Ambulates with cane  at baseline PT eval: recommend SNF, however will have them re-evaluate now that patient is weaned from supplemental oxygen  Chronic Anemia  Hgb 9.1 on admit.  Previous hbg from outside lab was 8.7 on 02/2019 which appears to be around his baseline continue follow up outpatient  History of epilepsy Continue Keppra  Acute on Chronic kidney disease stage IV Baseline creatinine around 2-2.5 Admit 2.7 500cc bolus  Avoid nephrotoxic agent  Improving, approaching baseline  Type 2 diabetes Last HbA1C of 6.7 on 04/2019 Add SSI while on steroids  History of DVT of the lower extremity Continue Xarelto  Hypertension Continue diltiazem, metoprolol and lasix Hold olmesartan -HCTZ due to AKI   Lymphedema Continue compression stockings  OSA Will hold off on CPAP to decrease aerosolizing Covid infection   DVT prophylaxis: Xarelto Code Status: Full Family Communication: Daughter [name] [phone] on 1/4 disposition Plan: Home   Consultants:   None  Procedures:   None  Antimicrobials:   Remdesivir (06/24/19- )  Rocephin (06/24/19- )  Azithromycin(06/24/19- )   Subjective: Seen and examined No acute interval status changes No new complaints Objective: Vitals:   06/26/19 1948 06/27/19 0417 06/27/19 0744 06/27/19 1208  BP: 105/65 (!) 121/92 119/73   Pulse: 75 (!) 53 (!) 51   Resp: 20 20 16    Temp: 97.6 F (36.4 C) 98 F (36.7 C) (!) 97.5 F (36.4 C)   TempSrc: Oral Oral Oral   SpO2: 97% 100% 100% 97%  Weight:      Height:        Intake/Output Summary (Last  24 hours) at 06/27/2019 1240 Last data filed at 06/27/2019 1104 Gross per 24 hour  Intake 493.27 ml  Output 2150 ml  Net -1656.73 ml   Filed Weights   06/24/19 1938 06/25/19 2039  Weight: 127 kg 128.7 kg    Examination:  General exam: Appears calm and comfortable  Respiratory system: Scattered crackles bilaterally.  Normal WOB Cardiovascular system: S1 & S2 heard, RRR. No JVD, murmurs, rubs,  gallops or clicks. No pedal edema. Gastrointestinal system: Abdomen is nondistended, soft and nontender. No organomegaly or masses felt. Normal bowel sounds heard. Central nervous system: Alert and oriented. No focal neurological deficits. Extremities: Symmetric 5 x 5 power. Skin: No rashes, lesions or ulcers Psychiatry: Judgement and insight appear normal. Mood & affect appropriate.     Data Reviewed: I have personally reviewed following labs and imaging studies  CBC: Recent Labs  Lab 06/24/19 1956 06/27/19 0623  WBC 8.5 9.1  NEUTROABS 7.1 8.0*  HGB 9.1* 8.7*  HCT 26.8* 25.6*  MCV 67.8* 65.5*  PLT 179 295   Basic Metabolic Panel: Recent Labs  Lab 06/24/19 2110 06/25/19 0607 06/27/19 0623  NA 135 137 137  K 4.2 4.7 4.9  CL 106 105 107  CO2 20* 21* 22  GLUCOSE 117* 162* 147*  BUN 47* 46* 55*  CREATININE 2.72* 2.29* 1.94*  CALCIUM 8.7* 8.6* 8.5*   GFR: Estimated Creatinine Clearance: 43 mL/min (A) (by C-G formula based on SCr of 1.94 mg/dL (H)). Liver Function Tests: Recent Labs  Lab 06/24/19 2110  AST 47*  ALT 25  ALKPHOS 52  BILITOT 0.7  PROT 6.9  ALBUMIN 3.1*   No results for input(s): LIPASE, AMYLASE in the last 168 hours. No results for input(s): AMMONIA in the last 168 hours. Coagulation Profile: No results for input(s): INR, PROTIME in the last 168 hours. Cardiac Enzymes: No results for input(s): CKTOTAL, CKMB, CKMBINDEX, TROPONINI in the last 168 hours. BNP (last 3 results) No results for input(s): PROBNP in the last 8760 hours. HbA1C: Recent Labs    06/25/19 1529  HGBA1C 5.3   CBG: Recent Labs  Lab 06/26/19 1139 06/26/19 1630 06/26/19 1946 06/27/19 0742 06/27/19 1102  GLUCAP 145* 123* 154* 136* 203*   Lipid Profile: Recent Labs    06/24/19 2110  TRIG 66   Thyroid Function Tests: No results for input(s): TSH, T4TOTAL, FREET4, T3FREE, THYROIDAB in the last 72 hours. Anemia Panel: Recent Labs    06/26/19 0446 06/27/19 0623    FERRITIN 167 223   Sepsis Labs: Recent Labs  Lab 06/24/19 1956 06/24/19 2110 06/25/19 0617 06/25/19 1529 06/26/19 0446 06/27/19 0623  PROCALCITON  --  0.53  --  0.74 0.70 0.53  LATICACIDVEN 1.1  --  0.8  --   --   --     Recent Results (from the past 240 hour(s))  Blood Culture (routine x 2)     Status: None (Preliminary result)   Collection Time: 06/24/19  7:57 PM   Specimen: BLOOD  Result Value Ref Range Status   Specimen Description BLOOD RIGHT ANTECUBITAL  Final   Special Requests   Final    BOTTLES DRAWN AEROBIC AND ANAEROBIC Blood Culture adequate volume   Culture   Final    NO GROWTH 3 DAYS Performed at [facility], [address], [place], [place] [address]    Report Status PENDING  Incomplete  Blood Culture (routine x 2)     Status: None (Preliminary result)   Collection Time: 06/24/19  7:57 PM   Specimen: BLOOD  Result Value Ref Range Status   Specimen Description BLOOD RIGHT FOREARM  Final   Special Requests   Final    BOTTLES DRAWN AEROBIC AND ANAEROBIC Blood Culture adequate volume   Culture   Final    NO GROWTH 3 DAYS Performed at [facility], [address], [place], [place] [address]    Report Status PENDING  Incomplete         Radiology Studies: No results found.      Scheduled Meds: . allopurinol  100 mg Oral Daily  . vitamin C  500 mg Oral Daily  . azithromycin  500 mg Oral Daily  . dexamethasone (DECADRON) injection  6 mg Intravenous Q24H  . diltiazem  240 mg Oral Daily  . docusate sodium  100 mg Oral Daily  . feeding supplement (ENSURE ENLIVE)  237 mL Oral BID BM  . furosemide  40 mg Oral QODAY  . gabapentin  100 mg Oral TID  . insulin aspart  0-15 Units Subcutaneous TID WC  . insulin aspart  0-5 Units Subcutaneous QHS  . levETIRAcetam  500 mg Oral BID  . metoprolol succinate  50 mg Oral Daily  . Rivaroxaban  20 mg Oral Q supper  . sodium chloride flush  3 mL Intravenous Q12H  . tamsulosin  0.4 mg Oral  QPC breakfast   Continuous Infusions: . cefTRIAXone (ROCEPHIN)  IV Stopped (06/26/19 2110)  . remdesivir 100 mg in NS 100 mL 100 mg (06/27/19 1039)     LOS: 3 days    Time spent: 35 minutes    Sidney Ace, MD [facility] Pager [phone]  If 7PM-7AM, please contact night-coverage [email or web address] Password [email or web address] 06/27/2019, 12:40 PM

## 2019-06-27 NOTE — Progress Notes (Signed)
Spoke to the patient's daughter [name] just now for a full update.

## 2019-06-28 ENCOUNTER — Ambulatory Visit: Payer: Medicare Other | Admitting: Podiatry

## 2019-06-28 LAB — CBC WITH DIFFERENTIAL/PLATELET
Abs Immature Granulocytes: 0.04 10*3/uL (ref 0.00–0.07)
Basophils Absolute: 0 10*3/uL (ref 0.0–0.1)
Basophils Relative: 0 %
Eosinophils Absolute: 0 10*3/uL (ref 0.0–0.5)
Eosinophils Relative: 0 %
HCT: 26.5 % — ABNORMAL LOW (ref 39.0–52.0)
Hemoglobin: 8.7 g/dL — ABNORMAL LOW (ref 13.0–17.0)
Immature Granulocytes: 1 %
Lymphocytes Relative: 12 %
Lymphs Abs: 0.9 10*3/uL (ref 0.7–4.0)
MCH: 22.8 pg — ABNORMAL LOW (ref 26.0–34.0)
MCHC: 32.8 g/dL (ref 30.0–36.0)
MCV: 69.4 fL — ABNORMAL LOW (ref 80.0–100.0)
Monocytes Absolute: 0.4 10*3/uL (ref 0.1–1.0)
Monocytes Relative: 5 %
Neutro Abs: 6.3 10*3/uL (ref 1.7–7.7)
Neutrophils Relative %: 82 %
Platelets: 200 10*3/uL (ref 150–400)
RBC: 3.82 MIL/uL — ABNORMAL LOW (ref 4.22–5.81)
RDW: 17.2 % — ABNORMAL HIGH (ref 11.5–15.5)
Smear Review: NORMAL
WBC: 7.6 10*3/uL (ref 4.0–10.5)
nRBC: 1.2 % — ABNORMAL HIGH (ref 0.0–0.2)

## 2019-06-28 LAB — GLUCOSE, CAPILLARY
Glucose-Capillary: 139 mg/dL — ABNORMAL HIGH (ref 70–99)
Glucose-Capillary: 150 mg/dL — ABNORMAL HIGH (ref 70–99)
Glucose-Capillary: 201 mg/dL — ABNORMAL HIGH (ref 70–99)
Glucose-Capillary: 215 mg/dL — ABNORMAL HIGH (ref 70–99)

## 2019-06-28 LAB — BASIC METABOLIC PANEL
Anion gap: 9 (ref 5–15)
BUN: 64 mg/dL — ABNORMAL HIGH (ref 8–23)
CO2: 22 mmol/L (ref 22–32)
Calcium: 8.6 mg/dL — ABNORMAL LOW (ref 8.9–10.3)
Chloride: 105 mmol/L (ref 98–111)
Creatinine, Ser: 2.17 mg/dL — ABNORMAL HIGH (ref 0.61–1.24)
GFR calc Af Amer: 33 mL/min — ABNORMAL LOW (ref >60)
GFR calc non Af Amer: 28 mL/min — ABNORMAL LOW (ref >60)
Glucose, Bld: 145 mg/dL — ABNORMAL HIGH (ref 70–99)
Potassium: 5.2 mmol/L — ABNORMAL HIGH (ref 3.5–5.1)
Sodium: 136 mmol/L (ref 135–145)

## 2019-06-28 LAB — FERRITIN: Ferritin: 315 ng/mL (ref 24–336)

## 2019-06-28 LAB — C-REACTIVE PROTEIN: CRP: 6.3 mg/dL — ABNORMAL HIGH (ref <1.0)

## 2019-06-28 LAB — D-DIMER, QUANTITATIVE: D-Dimer, Quant: 0.48 ug/mL-FEU (ref 0.00–0.50)

## 2019-06-28 NOTE — Progress Notes (Signed)
Physical Therapy Treatment Patient Details Name: Justin Shannon. MRN: [id number] DOB: [birth year] Today's Date: 06/28/2019    History of Present Illness Pt is a 79 year old male presenting with SOB secondary to [place]. Arrived to ED with dyspnea and O2 sat of 88%. PMH lower extremity DVT, DM, HTN, and CKD.    PT Comments    Pt is making gradual progress, able to stand with +1 assist, although it takes several attempts due to poor strength/power. Pt fatigues quickly and still requires hands on physical assist for all mobiltiy. Good endurance with HEP and able to keep O2 sats WNL. Agreeable to sit in recliner at this time. Still not at baseline level at this time. Will continue to progress as able.  Follow Up Recommendations  SNF     Equipment Recommendations  Rolling walker with 5" wheels    Recommendations for Other Services       Precautions / Restrictions Precautions Precautions: Fall Restrictions Weight Bearing Restrictions: No    Mobility  Bed Mobility Overal bed mobility: Needs Assistance Bed Mobility: Sit to Supine;Supine to Sit     Supine to sit: Min assist     General bed mobility comments: able to initiate moving B LEs off bed, still needs assist for trunk control. Once seated at EOB, still presents with post leaning, however cga and verbal cues for correction.  Transfers Overall transfer level: Needs assistance Equipment used: Rolling walker (2 wheeled) Transfers: Sit to/from Stand Sit to Stand: Max assist         General transfer comment: bed elevated. Several attempts for standing with assist required for foot positioning prior to standing. Attempts made with croc shoes donned this date. Once standing, upright posture noted with ability to maintain balance with cga  Ambulation/Gait Ambulation/Gait assistance: Min assist Gait Distance (Feet): 3 Feet Assistive device: Rolling walker (2 wheeled) Gait Pattern/deviations: Step-to pattern     General Gait  Details: hesitant steps towards recliner. Wide base of support, has difficulty staying close to RW. O2 sats at 93% post exertion while on RA.  Further distances limited secondary to fatigue and safety   Stairs             Wheelchair Mobility    Modified Rankin (Stroke Patients Only)       Balance Overall balance assessment: Needs assistance;History of Falls Sitting-balance support: Feet supported;Bilateral upper extremity supported Sitting balance-Leahy Scale: Fair Sitting balance - Comments: post leaning   Standing balance support: Bilateral upper extremity supported Standing balance-Leahy Scale: Fair                              Cognition Arousal/Alertness: Awake/alert Behavior During Therapy: WFL for tasks assessed/performed Overall Cognitive Status: Within Functional Limits for tasks assessed                                        Exercises Other Exercises Other Exercises: supine ther-ex performed B LEs including AP, quad sets, SLRs, hip abd/add, hip add squeezes and seated forward reaching with B UE. He still struggles with L UE, however able to actively participate with R hand. All ther-ex performed x 12 reps with cga. Also performed 10 reps of flutter valve. Safe technique performed    General Comments        Pertinent Vitals/Pain Pain Assessment: No/denies pain  Home Living                      Prior Function            PT Goals (current goals can now be found in the care plan section) Acute Rehab PT Goals Patient Stated Goal: Go home PT Goal Formulation: With patient Time For Goal Achievement: 07/09/19 Potential to Achieve Goals: Fair Progress towards PT goals: Progressing toward goals    Frequency    Min 2X/week      PT Plan Current plan remains appropriate    Co-evaluation              AM-PAC PT "6 Clicks" Mobility   Outcome Measure  Help needed turning from your back to your side while  in a flat bed without using bedrails?: A Little Help needed moving from lying on your back to sitting on the side of a flat bed without using bedrails?: A Little Help needed moving to and from a bed to a chair (including a wheelchair)?: A Lot Help needed standing up from a chair using your arms (e.g., wheelchair or bedside chair)?: A Lot Help needed to walk in hospital room?: A Lot Help needed climbing 3-5 steps with a railing? : Total 6 Click Score: 13    End of Session Equipment Utilized During Treatment: Gait belt Activity Tolerance: Patient limited by fatigue Patient left: in chair;with chair alarm set Nurse Communication: Mobility status PT Visit Diagnosis: Unsteadiness on feet (R26.81);Difficulty in walking, not elsewhere classified (R26.2);Other abnormalities of gait and mobility (R26.89);Muscle weakness (generalized) (M62.81)     Time: 5465-6812 PT Time Calculation (min) (ACUTE ONLY): 39 min  Charges:  $Gait Training: 8-22 mins $Therapeutic Exercise: 23-37 mins                     Greggory Stallion, [place], DPT [phone]    Justin Shannon 06/28/2019, 12:39 PM

## 2019-06-28 NOTE — Progress Notes (Signed)
PROGRESS NOTE    [facility].  AOZ:[id number] DOB: 01-Mar-1941 DOA: 06/24/2019 PCP: Kirk Ruths, MD    Brief Narrative:  Justin Eiland. is a 79 y.o. 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  Patient recently tested positive at [facility] for Covid on 12/28.  Since then he has noticed increasing cough productive of sputum as well as shortness of breath.  He suffers from chronic knee pain and normally ambulates with a cane but has been having increasing weakness as well.  Notes a temperature of 100.7 at home.  Wife at home is also positive for Covid. He denies any tobacco, alcohol illicit drug use.  1/3: Patient report symptomatic improvement over interval.  On 3L [place]  1/4: Symptomatic improvement over interval.  Patient feels well overall.  On 3 L supplemental oxygen this morning.  Nurse able to titrate off.  1/5: Patient titrated off supplemental oxygen altogether. Still endorses seven significant fatigue and weakness. Physical therapy recommending skilled nursing facility placement however family is apparently refusing per case management notes and like take the patient home at time of discharge.    Assessment & Plan:   Principal Problem:   Pneumonia due to COVID-19 virus Active Problems:   Epilepsy ([place])   History of DVT (deep vein thrombosis)   OSA on CPAP   Lymphedema   Type 2 diabetes mellitus with stage 4 chronic kidney disease, without long-term current use of insulin (HCC)   Respiratory failure with hypoxia (HCC)   Anemia due to stage 4 chronic kidney disease (HCC)   Essential hypertension   Generalized weakness  Acute hypoxic respiratory failure due to COVID (improving) possible superimposed bacterial pneumonia 2L on admit Maintain oxygen saturation >  92% IV decadron daily (day 4/10) IV Remdesvir (day 4/5) Will continue Rocephin and Azithromycin given elevated PCT. 5 day course, last day today Follow serial PCT.  Follow inflammatory markers COVID isolation precautions  Generalized Weakness Suspect this is acute on chronic from deconditioning Ambulates with cane at baseline PT eval: recommend SNF Had a long conversation via phone with the patient's daughter Justin Shannon.  Apparently the patient's wife was refusing skilled nursing facility however Justin Shannon may be the primary caregiver and was unaware of this decision.  Communicated this with the transitions of care team, they will reach out to the patient's family.  The patient medically may not have issues precluding him from going home however he requires a significant amount of assistance to move and change positions and this is likely going to be a challenge for the family to accommodate at home.  Case management aware and are currently investigating solutions.  Chronic Anemia  Hgb 9.1 on admit.  Previous hbg from outside lab was 8.7 on 02/2019 which appears to be around his baseline continue follow up outpatient  History of epilepsy Continue Keppra  Acute on Chronic kidney disease stage IV Baseline creatinine around 2-2.5 Admit 2.7 500cc bolus  Avoid nephrotoxic agent  Improving, approaching baseline  Type 2 diabetes Last HbA1C of 6.7 on 04/2019 Add SSI while on steroids  History of DVT of the lower extremity Continue Xarelto  Hypertension Continue diltiazem, metoprolol and lasix Hold olmesartan -HCTZ due to AKI   Lymphedema Continue compression stockings  OSA Will hold off on CPAP to decrease aerosolizing Covid infection  DVT prophylaxis: Xarelto Code Status: Full Family Communication: Daughter Justin Shannon [phone] on 1/5 disposition Plan: Home with home health vs SNF   Consultants:   None  Procedures:   None  Antimicrobials:   Remdesivir  (06/24/19-1/6 )  Rocephin (06/24/19-1/5 )  Azithromycin(06/24/19-1/5 )   Subjective: Seen and examined No acute interval status changes No new complaints Off supplemental oxygen  Objective: Vitals:   06/27/19 1522 06/27/19 2037 06/28/19 0459 06/28/19 0744  BP: 118/68 118/64 128/84 126/76  Pulse: (!) 58 (!) 54 62 (!) 55  Resp: 16   18  Temp: (!) 97.4 F (36.3 C) 97.6 F (36.4 C) 97.7 F (36.5 C) (!) 97.5 F (36.4 C)  TempSrc: Oral Oral Oral Oral  SpO2: 98% 97% 95% 96%  Weight:      Height:        Intake/Output Summary (Last 24 hours) at 06/28/2019 1418 Last data filed at 06/28/2019 1325 Gross per 24 hour  Intake 3 ml  Output 900 ml  Net -897 ml   Filed Weights   06/24/19 1938 06/25/19 2039  Weight: 127 kg 128.7 kg    Examination:  General exam: Appears calm and comfortable  Respiratory system: Scattered crackles bilaterally.  Normal WOB Cardiovascular system: S1 & S2 heard, RRR. No JVD, murmurs, rubs, gallops or clicks. No pedal edema. Gastrointestinal system: Abdomen is nondistended, soft and nontender. No organomegaly or masses felt. Normal bowel sounds heard. Central nervous system: Alert and oriented. No focal neurological deficits. Extremities: Symmetric 5 x 5 power. Skin: No rashes, lesions or ulcers Psychiatry: Judgement and insight appear normal. Mood & affect appropriate.     Data Reviewed: I have personally reviewed following labs and imaging studies  CBC: Recent Labs  Lab 06/24/19 1956 06/27/19 0623 06/28/19 0524  WBC 8.5 9.1 7.6  NEUTROABS 7.1 8.0* 6.3  HGB 9.1* 8.7* 8.7*  HCT 26.8* 25.6* 26.5*  MCV 67.8* 65.5* 69.4*  PLT 179 179 704   Basic Metabolic Panel: Recent Labs  Lab 06/24/19 2110 06/25/19 0607 06/27/19 0623 06/28/19 0524  NA 135 137 137 136  K 4.2 4.7 4.9 5.2*  CL 106 105 107 105  CO2 20* 21* 22 22  GLUCOSE 117* 162* 147* 145*  BUN 47* 46* 55* 64*  CREATININE 2.72* 2.29* 1.94* 2.17*  CALCIUM 8.7* 8.6* 8.5* 8.6*    GFR: Estimated Creatinine Clearance: 38.4 mL/min (A) (by C-G formula based on SCr of 2.17 mg/dL (H)). Liver Function Tests: Recent Labs  Lab 06/24/19 2110  AST 47*  ALT 25  ALKPHOS 52  BILITOT 0.7  PROT 6.9  ALBUMIN 3.1*   No results for input(s): LIPASE, AMYLASE in the last 168 hours. No results for input(s): AMMONIA in the last 168 hours. Coagulation Profile: No results for input(s): INR, PROTIME in the last 168 hours. Cardiac Enzymes: No results for input(s): CKTOTAL, CKMB, CKMBINDEX, TROPONINI in the last 168 hours. BNP (last 3 results) No results for input(s): PROBNP in the last 8760 hours. HbA1C: Recent Labs    06/25/19 1529  HGBA1C 5.3   CBG: Recent Labs  Lab 06/27/19 1102 06/27/19 1619 06/27/19 2039 06/28/19 0742 06/28/19 1151  GLUCAP 203* 144* 132* 139* 150*   Lipid Profile: No results for input(s): CHOL, HDL, LDLCALC, TRIG, CHOLHDL, LDLDIRECT in the last 72 hours. Thyroid Function Tests: No results for input(s): TSH, T4TOTAL, FREET4, T3FREE, THYROIDAB in the last 72 hours. Anemia Panel: Recent Labs    06/27/19 0623 06/28/19 0524  FERRITIN 223 315   Sepsis  Labs: Recent Labs  Lab 06/24/19 1956 06/24/19 2110 06/25/19 0617 06/25/19 1529 06/26/19 0446 06/27/19 0623  PROCALCITON  --  0.53  --  0.74 0.70 0.53  LATICACIDVEN 1.1  --  0.8  --   --   --     Recent Results (from the past 240 hour(s))  Blood Culture (routine x 2)     Status: None (Preliminary result)   Collection Time: 06/24/19  7:57 PM   Specimen: BLOOD  Result Value Ref Range Status   Specimen Description BLOOD RIGHT ANTECUBITAL  Final   Special Requests   Final    BOTTLES DRAWN AEROBIC AND ANAEROBIC Blood Culture adequate volume   Culture   Final    NO GROWTH 4 DAYS Performed at [facility], [address] [place], [place] [address]    Report Status PENDING  Incomplete  Blood Culture (routine x 2)     Status: None (Preliminary result)   Collection Time:  06/24/19  7:57 PM   Specimen: BLOOD  Result Value Ref Range Status   Specimen Description BLOOD RIGHT FOREARM  Final   Special Requests   Final    BOTTLES DRAWN AEROBIC AND ANAEROBIC Blood Culture adequate volume   Culture   Final    NO GROWTH 4 DAYS Performed at [facility], [address], [place], [place] [address]    Report Status PENDING  Incomplete         Radiology Studies: No results found.      Scheduled Meds: . allopurinol  100 mg Oral Daily  . vitamin C  500 mg Oral Daily  . azithromycin  500 mg Oral Daily  . dexamethasone (DECADRON) injection  6 mg Intravenous Q24H  . diltiazem  240 mg Oral Daily  . docusate sodium  100 mg Oral Daily  . feeding supplement (ENSURE ENLIVE)  237 mL Oral BID BM  . furosemide  40 mg Oral QODAY  . gabapentin  100 mg Oral TID  . insulin aspart  0-15 Units Subcutaneous TID WC  . insulin aspart  0-5 Units Subcutaneous QHS  . levETIRAcetam  500 mg Oral BID  . metoprolol succinate  50 mg Oral Daily  . Rivaroxaban  20 mg Oral Q supper  . sodium chloride flush  3 mL Intravenous Q12H  . tamsulosin  0.4 mg Oral QPC breakfast   Continuous Infusions: . cefTRIAXone (ROCEPHIN)  IV 1 g (06/27/19 2037)     LOS: 4 days    Time spent: 35 minutes    Sidney Ace, MD [facility] Pager [phone]  If 7PM-7AM, please contact night-coverage [email or web address] Password [email or web address] 06/28/2019, 2:18 PM

## 2019-06-28 NOTE — Care Management Important Message (Signed)
Important Message  Patient Details  Name: Justin Shannon. MRN: [id number] Date of Birth: 1940-07-14   Medicare Important Message Given:  Yes  Attempted to reach patient via room phone twice, though no answer each time.  Spoke with [name], wife, at [phone] and reviewed Medicare IM.  Declined copy as Patient Access sending one to her via mail.     Dannette Barbara 06/28/2019, 1:47 PM

## 2019-06-28 NOTE — TOC Initial Note (Signed)
Transition of Care (TOC) - Initial/Assessment Note    Patient Details  Name: Justin Shannon. MRN: [id number] Date of Birth: Feb 17, 1941  Transition of Care [facility]) CM/SW Contact:    Candie Chroman, LCSW Phone Number: 06/28/2019, 1:54 PM  Clinical Narrative: Per RN assessment, patient slightly confused. CSW called patient's wife, introduced role, and explained that PT recommendations would be discussed. Wife confirmed they do not want SNF placement and would prefer to resume [facility]PT with [facility]. CSW called [facility] representative and confirmed he is active. Wife explained some difficulties with caring for patient at home. [facility] is able to add an aide and a Education officer, museum so they can start looking into possibly getting private care services. Patient already has a walker at home. Wife said he has a cpap at home but needs a newer one. Daughter will likely take him home at discharge. No further concerns. CSW encouraged patient's wife to contact CSW as needed. CSW will continue to follow patient and his wife for support and facilitate return home when stable.                  Expected Discharge Plan: [place] Barriers to Discharge: Continued Medical Work up   Patient Goals and CMS Choice Patient states their goals for this hospitalization and ongoing recovery are:: Patient not fully oriented.   Choice offered to / list presented to : NA  Expected Discharge Plan and Services Expected Discharge Plan: [place] Choice: Resumption of Svcs/PTA Provider Living arrangements for the past 2 months: [place] Arranged: PT, Nurse's Aide, Refused SNF [place] Agency: [facility] [employer] Date [place]: 06/28/19   Representative spoke with at [place]: Drue Novel  Prior Living Arrangements/Services Living arrangements for the past 2 months: [place]  with:: Adult Children, Spouse Patient language and need for interpreter reviewed:: Yes Do you feel safe going back to the place where you live?: Yes      Need for Family Participation in Patient Care: Yes (Comment) Care giver support system in place?: Yes (comment) Current home services: DME, Home PT Criminal Activity/Legal Involvement Pertinent to Current Situation/Hospitalization: No - Comment as needed  Activities of Daily Living Home Assistive Devices/Equipment: Gilford Rile (specify type) ADL Screening (condition at time of admission) [name] cognitive ability adequate to safely complete daily activities?: Yes Is the patient deaf or have difficulty hearing?: No Does the patient have difficulty seeing, even when wearing glasses/contacts?: No Does the patient have difficulty concentrating, remembering, or making decisions?: No Patient able to express need for assistance with ADLs?: Yes Does the patient have difficulty dressing or bathing?: No Independently performs ADLs?: No Communication: Independent Does the patient have difficulty walking or climbing stairs?: No Weakness of Legs: Both Weakness of Arms/Hands: None  Permission Sought/Granted Permission sought to share information with : Facility Sport and exercise psychologist, Family Supports    Share Information with NAME: Justin Shannon  Permission granted to share info w AGENCY: [facility] [employer] granted to share info w Relationship: Wife  Permission granted to share info w Contact Information: [phone]  Emotional Assessment Appearance:: Appears stated age Attitude/Demeanor/Rapport: Unable to Assess Affect (typically observed): Unable to Assess Orientation: : Oriented to Self, Oriented to Place, Oriented  to Situation Alcohol / Substance Use: Not Applicable Psych Involvement: No (comment)  Admission diagnosis:  Morbid obesity ([place]) [E66.01] Acute respiratory failure with hypoxia (HCC) [J96.01] Pneumonia due to COVID-19  virus [U07.1, J12.82] Patient Active Problem List   Diagnosis Date Noted  . Pneumonia due to COVID-19 virus 06/24/2019  . Respiratory failure with hypoxia ([place]) 06/24/2019  . Anemia due to stage 4 chronic kidney disease ([place]) 06/24/2019  . Essential hypertension 06/24/2019  . Generalized weakness 06/24/2019  . Primary osteoarthritis of right knee 04/03/2019  . Venous ulcer of ankle, right ([place]) 05/08/2018  . Use of cane as ambulatory aid 04/27/2018  . Type 2 diabetes mellitus with stage 4 chronic kidney disease, without long-term current use of insulin ([place]) 06/11/2017  . Lymphedema 05/04/2016  . Chronic venous insufficiency 05/04/2016  . Toe sprain 01/24/2016  . Health care maintenance 12/11/2014  . Morbid (severe) obesity due to excess calories ([place]) 05/24/2014  . Morbid obesity with body mass index of 40.0-44.9 in adult [facility]) 05/24/2014  . OSA on CPAP 01/10/2014  . Chronic kidney disease (CKD), stage III (moderate) 11/22/2013  . Allergic rhinitis 11/06/2013  . Benign hypertension with chronic kidney disease, stage III ([place]) 11/06/2013  . Epilepsy ([place]) 11/06/2013  . Gout 11/06/2013  . History of DVT (deep vein thrombosis) 11/06/2013  . 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 Chronic deep vein thrombosis (DVT) of proximal vein of lower extremity ([place]) 11/06/2013   PCP:  Kirk Ruths, MD Pharmacy:   [facility] [address] ([address]), [place] [address] [place]) [place] [address] Phone: [phone] Fax: [place], [place] [facility] [address] [place] [address] [place] [address] Phone: [phone] Fax: [phone]     Social Determinants of Health (SDOH) Interventions    Readmission Risk Interventions No flowsheet data found.

## 2019-06-29 ENCOUNTER — Encounter: Payer: Self-pay | Admitting: *Deleted

## 2019-06-29 DIAGNOSIS — N400 Enlarged prostate without lower urinary tract symptoms: Secondary | ICD-10-CM

## 2019-06-29 DIAGNOSIS — N1831 Chronic kidney disease, stage 3a: Secondary | ICD-10-CM

## 2019-06-29 DIAGNOSIS — J9601 Acute respiratory failure with hypoxia: Secondary | ICD-10-CM

## 2019-06-29 DIAGNOSIS — I10 Essential (primary) hypertension: Secondary | ICD-10-CM

## 2019-06-29 DIAGNOSIS — D631 Anemia in chronic kidney disease: Secondary | ICD-10-CM

## 2019-06-29 DIAGNOSIS — R531 Weakness: Secondary | ICD-10-CM

## 2019-06-29 DIAGNOSIS — R569 Unspecified convulsions: Secondary | ICD-10-CM

## 2019-06-29 LAB — BASIC METABOLIC PANEL
Anion gap: 6 (ref 5–15)
BUN: 70 mg/dL — ABNORMAL HIGH (ref 8–23)
CO2: 25 mmol/L (ref 22–32)
Calcium: 8.9 mg/dL (ref 8.9–10.3)
Chloride: 105 mmol/L (ref 98–111)
Creatinine, Ser: 2.3 mg/dL — ABNORMAL HIGH (ref 0.61–1.24)
GFR calc Af Amer: 31 mL/min — ABNORMAL LOW (ref >60)
GFR calc non Af Amer: 26 mL/min — ABNORMAL LOW (ref >60)
Glucose, Bld: 168 mg/dL — ABNORMAL HIGH (ref 70–99)
Potassium: 5.1 mmol/L (ref 3.5–5.1)
Sodium: 136 mmol/L (ref 135–145)

## 2019-06-29 LAB — CBC WITH DIFFERENTIAL/PLATELET
Abs Immature Granulocytes: 0.14 10*3/uL — ABNORMAL HIGH (ref 0.00–0.07)
Basophils Absolute: 0 10*3/uL (ref 0.0–0.1)
Basophils Relative: 0 %
Eosinophils Absolute: 0 10*3/uL (ref 0.0–0.5)
Eosinophils Relative: 0 %
HCT: 28 % — ABNORMAL LOW (ref 39.0–52.0)
Hemoglobin: 9.1 g/dL — ABNORMAL LOW (ref 13.0–17.0)
Immature Granulocytes: 2 %
Lymphocytes Relative: 14 %
Lymphs Abs: 1.1 10*3/uL (ref 0.7–4.0)
MCH: 22.8 pg — ABNORMAL LOW (ref 26.0–34.0)
MCHC: 32.5 g/dL (ref 30.0–36.0)
MCV: 70 fL — ABNORMAL LOW (ref 80.0–100.0)
Monocytes Absolute: 0.3 10*3/uL (ref 0.1–1.0)
Monocytes Relative: 4 %
Neutro Abs: 6.3 10*3/uL (ref 1.7–7.7)
Neutrophils Relative %: 80 %
Platelets: 223 10*3/uL (ref 150–400)
RBC: 4 MIL/uL — ABNORMAL LOW (ref 4.22–5.81)
RDW: 17.4 % — ABNORMAL HIGH (ref 11.5–15.5)
Smear Review: NORMAL
WBC: 7.8 10*3/uL (ref 4.0–10.5)
nRBC: 1.1 % — ABNORMAL HIGH (ref 0.0–0.2)

## 2019-06-29 LAB — D-DIMER, QUANTITATIVE: D-Dimer, Quant: 0.41 ug/mL-FEU (ref 0.00–0.50)

## 2019-06-29 LAB — CULTURE, BLOOD (ROUTINE X 2)
Culture: NO GROWTH
Culture: NO GROWTH
Special Requests: ADEQUATE
Special Requests: ADEQUATE

## 2019-06-29 LAB — GLUCOSE, CAPILLARY
Glucose-Capillary: 147 mg/dL — ABNORMAL HIGH (ref 70–99)
Glucose-Capillary: 188 mg/dL — ABNORMAL HIGH (ref 70–99)
Glucose-Capillary: 183 mg/dL — ABNORMAL HIGH (ref 70–99)
Glucose-Capillary: 290 mg/dL — ABNORMAL HIGH (ref 70–99)

## 2019-06-29 LAB — C-REACTIVE PROTEIN: CRP: 4.7 mg/dL — ABNORMAL HIGH (ref <1.0)

## 2019-06-29 LAB — FERRITIN: Ferritin: 222 ng/mL (ref 24–336)

## 2019-06-29 MED ORDER — DEXAMETHASONE 4 MG PO TABS
4.0000 mg | ORAL_TABLET | Freq: Every day | ORAL | Status: DC
  Administered 2019-06-29 – 2019-07-01 (×3): 4 mg via ORAL
  Filled 2019-06-29 (×3): qty 1

## 2019-06-29 MED ORDER — SODIUM ZIRCONIUM CYCLOSILICATE 10 G PO PACK
10.0000 g | PACK | Freq: Every day | ORAL | Status: DC
  Administered 2019-06-29: 09:00:00 10 g via ORAL
  Filled 2019-06-29 (×2): qty 1

## 2019-06-29 MED ORDER — DEXAMETHASONE 1 MG PO TABS: ORAL_TABLET | ORAL | 0 refills | Status: DC

## 2019-06-29 MED ORDER — SODIUM ZIRCONIUM CYCLOSILICATE 10 G PO PACK: 10.0000 g | PACK | Freq: Every day | ORAL | 0 refills | Status: DC

## 2019-06-29 MED ORDER — ENSURE ENLIVE PO LIQD: 237.0000 mL | Freq: Two times a day (BID) | ORAL | 0 refills | Status: DC

## 2019-06-29 MED ORDER — SODIUM CHLORIDE 0.9 % IV BOLUS
250.0000 mL | Freq: Once | INTRAVENOUS | Status: AC
  Administered 2019-06-29: 250 mL via INTRAVENOUS

## 2019-06-29 NOTE — NC FL2 (Deleted)
[place] LEVEL OF CARE SCREENING TOOL     IDENTIFICATION  Patient Name: Justin Shannon. Birthdate: 1940/09/28 Sex: male Admission Date (Current Location): 06/24/2019  [place] and [place] Number:  Engineering geologist and Address:  [facility], [address] [place], [place] [address]      Provider Number: [id number]  Attending Physician Name and Address:  Loletha Grayer, MD  Relative Name and Phone Number:       Current Level of Care: Hospital Recommended Level of Care: [place] Prior Approval Number:    Date Approved/Denied:   PASRR Number:    Discharge Plan: SNF    Current Diagnoses: Patient Active Problem List   Diagnosis Date Noted  . Pneumonia due to COVID-19 virus 06/24/2019  . Respiratory failure with hypoxia ([place]) 06/24/2019  . Anemia due to stage 4 chronic kidney disease ([place]) 06/24/2019  . Essential hypertension 06/24/2019  . Generalized weakness 06/24/2019  . Primary osteoarthritis of right knee 04/03/2019  . Venous ulcer of ankle, right ([place]) 05/08/2018  . Use of cane as ambulatory aid 04/27/2018  . Type 2 diabetes mellitus with stage 4 chronic kidney disease, without long-term current use of insulin ([place]) 06/11/2017  . Lymphedema 05/04/2016  . Chronic venous insufficiency 05/04/2016  . Toe sprain 01/24/2016  . Health care maintenance 12/11/2014  . Morbid (severe) obesity due to excess calories ([place]) 05/24/2014  . Morbid obesity with body mass index of 40.0-44.9 in adult [facility]) 05/24/2014  . OSA on CPAP 01/10/2014  . Chronic kidney disease (CKD), stage III (moderate) 11/22/2013  . Allergic rhinitis 11/06/2013  . Benign hypertension with chronic kidney disease, stage III ([place]) 11/06/2013  . Epilepsy ([place]) 11/06/2013  . Gout 11/06/2013  . History of DVT (deep vein thrombosis) 11/06/2013  . 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 Chronic deep vein thrombosis (DVT) of proximal vein of lower  extremity (HCC) 11/06/2013    Orientation RESPIRATION BLADDER Height & Weight     Self, Time, Situation, Place  Normal Continent Weight: 283 lb 11.2 oz (128.7 kg) Height:  5\' 10"  (177.8 cm)  BEHAVIORAL SYMPTOMS/MOOD NEUROLOGICAL BOWEL NUTRITION STATUS      Continent Diet(regular diet)  AMBULATORY STATUS COMMUNICATION OF NEEDS Skin   Limited Assist Verbally Skin abrasions(open wound on toe, guaze dressing)                       Personal Care Assistance Level of Assistance  Bathing, Feeding, Dressing Bathing Assistance: Limited assistance Feeding assistance: Independent Dressing Assistance: Limited assistance     Functional Limitations Info  Sight, Hearing, Speech Sight Info: Adequate Hearing Info: Adequate Speech Info: Adequate    SPECIAL CARE FACTORS FREQUENCY  OT (By licensed OT), PT (By licensed PT)     PT Frequency: 5x OT Frequency: 5x            Contractures Contractures Info: Not present    Additional Factors Info  Code Status, Allergies Code Status Info: Full Code Allergies Info: Shellfish Allergy           Current Medications (06/29/2019):  This is the current hospital active medication list Current Facility-Administered Medications  Medication Dose Route Frequency Provider Last Rate Last Admin  . acetaminophen (TYLENOL) tablet 650 mg  650 mg Oral Q6H PRN Tu, Ching T, DO   650 mg at 06/28/19 1835  . allopurinol (ZYLOPRIM) tablet 100 mg  100 mg Oral Daily Tu, Ching T, DO   100 mg  at 06/29/19 0849  . ascorbic acid (VITAMIN C) tablet 500 mg  500 mg Oral Daily Tu, Ching T, DO   500 mg at 06/29/19 0849  . dexamethasone (DECADRON) tablet 4 mg  4 mg Oral Daily Loletha Grayer, MD   4 mg at 06/29/19 1245  . diltiazem (CARDIZEM CD) 24 hr capsule 240 mg  240 mg Oral Daily Tu, Ching T, DO   240 mg at 06/29/19 0849  . docusate sodium (COLACE) capsule 100 mg  100 mg Oral Daily Tu, Ching T, DO   100 mg at 06/29/19 0849  . feeding supplement (ENSURE ENLIVE)  (ENSURE ENLIVE) liquid 237 mL  237 mL Oral BID BM Sreenath, Sudheer B, MD   237 mL at 06/28/19 1222  . gabapentin (NEURONTIN) capsule 100 mg  100 mg Oral TID Tu, Ching T, DO   100 mg at 06/29/19 1245  . insulin aspart (novoLOG) injection 0-15 Units  0-15 Units Subcutaneous TID WC Ralene Muskrat B, MD   3 Units at 06/29/19 1245  . insulin aspart (novoLOG) injection 0-5 Units  0-5 Units Subcutaneous QHS Ralene Muskrat B, MD   2 Units at 06/28/19 2106  . levETIRAcetam (KEPPRA) tablet 500 mg  500 mg Oral BID Tu, Ching T, DO   500 mg at 06/29/19 0849  . metoprolol succinate (TOPROL-XL) 24 hr tablet 50 mg  50 mg Oral Daily Tu, Ching T, DO   50 mg at 06/29/19 0850  . rivaroxaban (XARELTO) tablet 20 mg  20 mg Oral Q supper Rocky Morel, RPH   20 mg at 06/28/19 1727  . sodium chloride flush (NS) 0.9 % injection 3 mL  3 mL Intravenous Q12H Ralene Muskrat B, MD   3 mL at 06/29/19 0850  . sodium zirconium cyclosilicate (LOKELMA) packet 10 g  10 g Oral Daily Loletha Grayer, MD   10 g at 06/29/19 0850  . tamsulosin (FLOMAX) capsule 0.4 mg  0.4 mg Oral QPC breakfast Tu, Ching T, DO   0.4 mg at 06/29/19 1572     Discharge Medications: Please see discharge summary for a list of discharge medications.  Relevant Imaging Results:  Relevant Lab Results:   Additional Information IOM:[id number]  Gerrianne Scale Israella Hubert, LCSW

## 2019-06-29 NOTE — Progress Notes (Signed)
[name], male   DOB: Oct 03, 1940, 79 y.o.   MRN: [id number] [facility] PROGRESS NOTE  Justin Paganini. KNL:[id number] DOB: 12/30/1940 DOA: 06/24/2019 PCP: Kirk Ruths, MD  HPI/Subjective: Patient feeling better.  States he does not eat well here in the hospital because he does not like the food.  Patient states he is ready to go home.  No cough.  No shortness of breath.  Objective: Vitals:   06/29/19 0859 06/29/19 1541  BP:  (!) 146/79  Pulse: 64 (!) 59  Resp:    Temp:  (!) 97.5 F (36.4 C)  SpO2: 99% 94%    Intake/Output Summary (Last 24 hours) at 06/29/2019 1607 Last data filed at 06/28/2019 2312 Gross per 24 hour  Intake --  Output 500 ml  Net -500 ml   Filed Weights   06/24/19 1938 06/25/19 2039  Weight: 127 kg 128.7 kg    ROS: Review of Systems  Constitutional: Positive for malaise/fatigue. Negative for chills and fever.  Eyes: Negative for blurred vision.  Respiratory: Negative for cough and shortness of breath.   Cardiovascular: Negative for chest pain.  Gastrointestinal: Negative for abdominal pain, constipation, diarrhea, nausea and vomiting.  Genitourinary: Negative for dysuria.  Musculoskeletal: Negative for joint pain.  Neurological: Negative for dizziness and headaches.   Exam: Physical Exam  HENT:  Nose: No mucosal edema.  Mouth/Throat: No oropharyngeal exudate or posterior oropharyngeal edema.  Eyes: Pupils are equal, round, and reactive to light. Conjunctivae, EOM and lids are normal.  Neck: Carotid bruit is not present.  Cardiovascular: S1 normal and S2 normal. Exam reveals no gallop.  No murmur heard. Pulses:      Dorsalis pedis pulses are 2+ on the right side and 2+ on the left side.  Respiratory: No respiratory distress. He has decreased breath sounds in the right lower field and the left lower field. He has no wheezes. He has no rhonchi. He has no rales.  GI: Soft. Bowel sounds are normal. There is no abdominal  tenderness.  Musculoskeletal:     Right ankle: Swelling present.     Left ankle: Swelling present.  Lymphadenopathy:    He has no cervical adenopathy.  Neurological: He is alert. No cranial nerve deficit.  Skin: Skin is warm. Nails show no clubbing.  Legs wrapped bilateral lower extremity  Psychiatric: He has a normal mood and affect.      Data Reviewed: Basic Metabolic Panel: Recent Labs  Lab 06/24/19 2110 06/25/19 0607 06/27/19 0623 06/28/19 0524 06/29/19 0426  NA 135 137 137 136 136  K 4.2 4.7 4.9 5.2* 5.1  CL 106 105 107 105 105  CO2 20* 21* 22 22 25   GLUCOSE 117* 162* 147* 145* 168*  BUN 47* 46* 55* 64* 70*  CREATININE 2.72* 2.29* 1.94* 2.17* 2.30*  CALCIUM 8.7* 8.6* 8.5* 8.6* 8.9   Liver Function Tests: Recent Labs  Lab 06/24/19 2110  AST 47*  ALT 25  ALKPHOS 52  BILITOT 0.7  PROT 6.9  ALBUMIN 3.1*   CBC: Recent Labs  Lab 06/24/19 1956 06/27/19 0623 06/28/19 0524 06/29/19 0426  WBC 8.5 9.1 7.6 7.8  NEUTROABS 7.1 8.0* 6.3 6.3  HGB 9.1* 8.7* 8.7* 9.1*  HCT 26.8* 25.6* 26.5* 28.0*  MCV 67.8* 65.5* 69.4* 70.0*  PLT 179 179 200 223    CBG: Recent Labs  Lab 06/28/19 1151 06/28/19 1710 06/28/19 2034 06/29/19 0736 06/29/19 1133  GLUCAP 150* 201* 215* 147* 188*    Recent  Results (from the past 240 hour(s))  Blood Culture (routine x 2)     Status: None   Collection Time: 06/24/19  7:57 PM   Specimen: BLOOD  Result Value Ref Range Status   Specimen Description BLOOD RIGHT ANTECUBITAL  Final   Special Requests   Final    BOTTLES DRAWN AEROBIC AND ANAEROBIC Blood Culture adequate volume   Culture   Final    NO GROWTH 5 DAYS Performed at [facility], [address], [place], [place] [address]    Report Status 06/29/2019 FINAL  Final  Blood Culture (routine x 2)     Status: None   Collection Time: 06/24/19  7:57 PM   Specimen: BLOOD  Result Value Ref Range Status   Specimen Description BLOOD RIGHT FOREARM  Final   Special  Requests   Final    BOTTLES DRAWN AEROBIC AND ANAEROBIC Blood Culture adequate volume   Culture   Final    NO GROWTH 5 DAYS Performed at [facility], [address] [place], [place] [address]    Report Status 06/29/2019 FINAL  Final      Scheduled Meds: . allopurinol  100 mg Oral Daily  . vitamin C  500 mg Oral Daily  . dexamethasone  4 mg Oral Daily  . diltiazem  240 mg Oral Daily  . docusate sodium  100 mg Oral Daily  . feeding supplement (ENSURE ENLIVE)  237 mL Oral BID BM  . gabapentin  100 mg Oral TID  . insulin aspart  0-15 Units Subcutaneous TID WC  . insulin aspart  0-5 Units Subcutaneous QHS  . levETIRAcetam  500 mg Oral BID  . metoprolol succinate  50 mg Oral Daily  . Rivaroxaban  20 mg Oral Q supper  . sodium chloride flush  3 mL Intravenous Q12H  . sodium zirconium cyclosilicate  10 g Oral Daily  . tamsulosin  0.4 mg Oral QPC breakfast   Continuous Infusions:  Assessment/Plan:  1. Weakness.  Family changed their mind quite a few times today on rehab and then going home and then now back to rehab.  Transitional care team looking into rehab bed availability use.  They will try to get insurance authorization. 2. COVID-19 pneumonia.  Patient finished up remdesivir.  Start to taper dexamethasone patient on vitamin C.  Patient also received Rocephin and Zithromax 5-day course. 3. Acute hypoxic respiratory failure.  Patient tapered off oxygen 4. Chronic anemia.  Last hemoglobin 9.1 5. Acute kidney injury on chronic kidney disease stage III.  Hold Lasix and give a fluid bolus.  Check BMP on a weekly basis 6. DVT on Xarelto 7. Essential hypertension on metoprolol 8. BPH on Flomax 9. Last hemoglobin A1c 5.3.  I would not classify this patient as a diabetic at this time.  Sugars may be a little higher secondary to steroids. 10. Seizure history on Keppra  Code Status:     Code Status Orders  (From admission, onward)         Start     Ordered   06/24/19  2136  Full code  Continuous     06/24/19 2136        Code Status History    This patient has a current code status but no historical code status.   Advance Care Planning Activity     Family Communication: Daughter on the phone Disposition Plan: Transitional care team to now look into rehab again.  Time spent: 32 minutes  Mirella Gueye [employer]  [facility]

## 2019-06-29 NOTE — TOC Progression Note (Addendum)
Transition of Care (TOC) - Progression Note    Patient Details  Name: Justin Shannon. MRN: [id number] Date of Birth: 12/13/1940  Transition of [facility]) CM/SW Contact  Eileen Stanford, LCSW Phone Number: 06/29/2019, 10:09 AM  Clinical Narrative:  Pt will resume [place] services with [facility]. Pt will receive PT, OT, RN, Aid, and SW. [facility] aware. Potential d/c today.    10:48- MD has spoke to daughter and at this time they want to persue SNF. CSW has sent referral. There is a barrier given that snf's that take COVID +are full at the time.  11:43- CSW spoke with Audelia Acton with [facility] and there is bed availability for pt at [facility]. CSW spoke with pt's daughter and she is not agreeable to sending pt that far. Pt's daughter states she would prefer pt d/c home with [facility] services. Pt's daughter states she will call her mother and let her know. MD is aware. Pt's daughter will be picking up pt once d/c is complete.   Expected Discharge Plan: [place] Barriers to Discharge: Continued Medical Work up  Expected Discharge Plan and Services Expected Discharge Plan: [place] Choice: Resumption of Svcs/PTA Provider Living arrangements for the past 2 months: Single Family Home                           HH Arranged: PT, Nurse's Aide, Refused SNF [place] [facility]: [facility] [employer] Date [place]: 06/28/19   Representative spoke with at [place]: [place] (SDOH) Interventions    Readmission Risk Interventions No flowsheet data found.

## 2019-06-29 NOTE — Progress Notes (Signed)
Patient cancelled his follow up appointment for diabetes for 06/21/19 due to COVID exposure from family member. Per chart review he is currently in the hospital.

## 2019-06-29 NOTE — Progress Notes (Signed)
Ch attempted to speak with pt via telephone but pt was [facility] through the phone. Ch will attempt to provide social support at a later time.

## 2019-06-29 NOTE — TOC Progression Note (Signed)
Transition of Care (TOC) - Progression Note    Patient Details  Name: Justin Shannon. MRN: [id number] Date of Birth: 1941-06-09  Transition of Care [facility]) CM/SW Contact  Eileen Stanford, LCSW Phone Number: 06/29/2019, 4:05 PM  Clinical Narrative:   As RN was preparing to d/c pt. Pt's wife called stating she wants pt to go to SNF in [place]. Facility is aware and has started [place]. MD aware. Anticipate d/c tomorrow.    Expected Discharge Plan: [place] Barriers to Discharge: Continued Medical Work up  Expected Discharge Plan and Services Expected Discharge Plan: [place] Choice: Resumption of Svcs/PTA Provider Living arrangements for the past 2 months: Single Family Home Expected Discharge Date: 06/29/19                         HH Arranged: PT, Nurse's Aide, Refused SNF [place] Agency: [facility] [employer] Date [place]: 06/28/19   Representative spoke with at [place]: [place] (SDOH) Interventions    Readmission Risk Interventions No flowsheet data found.

## 2019-06-29 NOTE — Progress Notes (Signed)
Spoke with patient's wife concerning patients discharge home this afternoon, she is concerned that she will not be able to provide care for the patient stating that she has just had surgery and is still in the recovery process. This RN informed her that the patient had been given option by social work to go to SNF but she refused and the patient refused. Due to the wife's concerns informed patient that it was probably best to have patient go to a SNF if she was uncomfortable with having him home. Social Worker, Armed forces technical officer informed about patient/wife's qualms.

## 2019-06-29 NOTE — NC FL2 (Signed)
[place] LEVEL OF CARE SCREENING TOOL     IDENTIFICATION  Patient Name: Justin Shannon. Birthdate: [birth year] Sex: male Admission Date (Current Location): 06/24/2019  [place] and [place] Number:  Engineering geologist and Address:  [facility], [address] [place], [place] [address]      Provider Number: [id number]  Attending Physician Name and Address:  Loletha Grayer, MD  Relative Name and Phone Number:       Current Level of Care: Hospital Recommended Level of Care: [place] Prior Approval Number:    Date Approved/Denied:   PASRR Number: [id number]  Discharge Plan: SNF    Current Diagnoses: Patient Active Problem List   Diagnosis Date Noted  . Pneumonia due to COVID-19 virus 06/24/2019  . Respiratory failure with hypoxia ([place]) 06/24/2019  . Anemia due to stage 4 chronic kidney disease ([place]) 06/24/2019  . Essential hypertension 06/24/2019  . Generalized weakness 06/24/2019  . Primary osteoarthritis of right knee 04/03/2019  . Venous ulcer of ankle, right ([place]) 05/08/2018  . Use of cane as ambulatory aid 04/27/2018  . Type 2 diabetes mellitus with stage 4 chronic kidney disease, without long-term current use of insulin ([place]) 06/11/2017  . Lymphedema 05/04/2016  . Chronic venous insufficiency 05/04/2016  . Toe sprain 01/24/2016  . Health care maintenance 12/11/2014  . Morbid (severe) obesity due to excess calories ([place]) 05/24/2014  . Morbid obesity with body mass index of 40.0-44.9 in adult [facility]) 05/24/2014  . OSA on CPAP 01/10/2014  . Chronic kidney disease (CKD), stage III (moderate) 11/22/2013  . Allergic rhinitis 11/06/2013  . Benign hypertension with chronic kidney disease, stage III ([place]) 11/06/2013  . Epilepsy ([place]) 11/06/2013  . Gout 11/06/2013  . History of DVT (deep vein thrombosis) 11/06/2013  . Nocturnal seizures ([place]) 11/06/2013  . Chronic deep vein thrombosis (DVT) of proximal vein of  lower extremity (HCC) 11/06/2013    Orientation RESPIRATION BLADDER Height & Weight     Self, Time, Situation, Place  Normal Continent Weight: 283 lb 11.2 oz (128.7 kg) Height:  5\' 10"  (177.8 cm)  BEHAVIORAL SYMPTOMS/MOOD NEUROLOGICAL BOWEL NUTRITION STATUS      Continent Diet(regular diet)  AMBULATORY STATUS COMMUNICATION OF NEEDS Skin   Limited Assist Verbally Skin abrasions(open wound on toe, guaze dressing)                       Personal Care Assistance Level of Assistance  Bathing, Feeding, Dressing Bathing Assistance: Limited assistance Feeding assistance: Independent Dressing Assistance: Limited assistance     Functional Limitations Info  Sight, Hearing, Speech Sight Info: Adequate Hearing Info: Adequate Speech Info: Adequate    SPECIAL CARE FACTORS FREQUENCY  OT (By licensed OT), PT (By licensed PT)     PT Frequency: 5x OT Frequency: 5x            Contractures Contractures Info: Not present    Additional Factors Info  Code Status, Allergies Code Status Info: Full Code Allergies Info: Shellfish Allergy           Current Medications (06/29/2019):  This is the current hospital active medication list Current Facility-Administered Medications  Medication Dose Route Frequency Provider Last Rate Last Admin  . acetaminophen (TYLENOL) tablet 650 mg  650 mg Oral Q6H PRN Tu, Ching T, DO   650 mg at 06/28/19 1835  . allopurinol (ZYLOPRIM) tablet 100 mg  100 mg Oral Daily Tu, Ching T, DO   100 mg at  06/29/19 0849  . ascorbic acid (VITAMIN C) tablet 500 mg  500 mg Oral Daily Tu, Ching T, DO   500 mg at 06/29/19 0849  . dexamethasone (DECADRON) tablet 4 mg  4 mg Oral Daily Loletha Grayer, MD   4 mg at 06/29/19 1245  . diltiazem (CARDIZEM CD) 24 hr capsule 240 mg  240 mg Oral Daily Tu, Ching T, DO   240 mg at 06/29/19 0849  . docusate sodium (COLACE) capsule 100 mg  100 mg Oral Daily Tu, Ching T, DO   100 mg at 06/29/19 0849  . feeding supplement (ENSURE ENLIVE)  (ENSURE ENLIVE) liquid 237 mL  237 mL Oral BID BM Sreenath, Sudheer B, MD   237 mL at 06/28/19 1222  . gabapentin (NEURONTIN) capsule 100 mg  100 mg Oral TID Tu, Ching T, DO   100 mg at 06/29/19 1245  . insulin aspart (novoLOG) injection 0-15 Units  0-15 Units Subcutaneous TID WC Ralene Muskrat B, MD   3 Units at 06/29/19 1245  . insulin aspart (novoLOG) injection 0-5 Units  0-5 Units Subcutaneous QHS Ralene Muskrat B, MD   2 Units at 06/28/19 2106  . levETIRAcetam (KEPPRA) tablet 500 mg  500 mg Oral BID Tu, Ching T, DO   500 mg at 06/29/19 0849  . metoprolol succinate (TOPROL-XL) 24 hr tablet 50 mg  50 mg Oral Daily Tu, Ching T, DO   50 mg at 06/29/19 0850  . rivaroxaban (XARELTO) tablet 20 mg  20 mg Oral Q supper Rocky Morel, RPH   20 mg at 06/28/19 1727  . sodium chloride flush (NS) 0.9 % injection 3 mL  3 mL Intravenous Q12H Ralene Muskrat B, MD   3 mL at 06/29/19 0850  . sodium zirconium cyclosilicate (LOKELMA) packet 10 g  10 g Oral Daily Loletha Grayer, MD   10 g at 06/29/19 0850  . tamsulosin (FLOMAX) capsule 0.4 mg  0.4 mg Oral QPC breakfast Tu, Ching T, DO   0.4 mg at 06/29/19 2130     Discharge Medications: Please see discharge summary for a list of discharge medications.  Relevant Imaging Results:  Relevant Lab Results:   Additional Information [id number]  Gerrianne Scale Maleka Contino, LCSW

## 2019-06-30 DIAGNOSIS — Z86718 Personal history of other venous thrombosis and embolism: Secondary | ICD-10-CM

## 2019-06-30 DIAGNOSIS — E875 Hyperkalemia: Secondary | ICD-10-CM

## 2019-06-30 DIAGNOSIS — N189 Chronic kidney disease, unspecified: Secondary | ICD-10-CM

## 2019-06-30 DIAGNOSIS — N179 Acute kidney failure, unspecified: Secondary | ICD-10-CM

## 2019-06-30 LAB — POTASSIUM: Potassium: 5.6 mmol/L — ABNORMAL HIGH (ref 3.5–5.1)

## 2019-06-30 LAB — BASIC METABOLIC PANEL
Anion gap: 15 (ref 5–15)
BUN: 72 mg/dL — ABNORMAL HIGH (ref 8–23)
CO2: 23 mmol/L (ref 22–32)
Calcium: 9.4 mg/dL (ref 8.9–10.3)
Chloride: 103 mmol/L (ref 98–111)
Creatinine, Ser: 2.24 mg/dL — ABNORMAL HIGH (ref 0.61–1.24)
GFR calc Af Amer: 32 mL/min — ABNORMAL LOW (ref >60)
GFR calc non Af Amer: 27 mL/min — ABNORMAL LOW (ref >60)
Glucose, Bld: 172 mg/dL — ABNORMAL HIGH (ref 70–99)
Potassium: 5.5 mmol/L — ABNORMAL HIGH (ref 3.5–5.1)
Sodium: 141 mmol/L (ref 135–145)

## 2019-06-30 LAB — GLUCOSE, CAPILLARY
Glucose-Capillary: 153 mg/dL — ABNORMAL HIGH (ref 70–99)
Glucose-Capillary: 252 mg/dL — ABNORMAL HIGH (ref 70–99)
Glucose-Capillary: 263 mg/dL — ABNORMAL HIGH (ref 70–99)
Glucose-Capillary: 270 mg/dL — ABNORMAL HIGH (ref 70–99)

## 2019-06-30 MED ORDER — SODIUM ZIRCONIUM CYCLOSILICATE 10 G PO PACK
10.0000 g | PACK | Freq: Three times a day (TID) | ORAL | Status: DC
  Administered 2019-06-30 – 2019-07-01 (×3): 10 g via ORAL
  Filled 2019-06-30 (×5): qty 1

## 2019-06-30 MED ORDER — SODIUM ZIRCONIUM CYCLOSILICATE 10 G PO PACK
10.0000 g | PACK | Freq: Two times a day (BID) | ORAL | Status: DC
  Administered 2019-06-30: 10 g via ORAL
  Filled 2019-06-30 (×2): qty 1

## 2019-06-30 MED ORDER — SODIUM CHLORIDE 0.9 % IV BOLUS
250.0000 mL | Freq: Once | INTRAVENOUS | Status: AC
  Administered 2019-06-30: 250 mL via INTRAVENOUS

## 2019-06-30 MED ORDER — SODIUM ZIRCONIUM CYCLOSILICATE 10 G PO PACK: 10.0000 g | PACK | Freq: Every day | ORAL | Status: DC

## 2019-06-30 NOTE — TOC Progression Note (Addendum)
Transition of Care (TOC) - Progression Note    Patient Details  Name: Elishua Radford. MRN: [id number] Date of Birth: Sep 27, 1940  Transition of Care [facility]) CM/SW Contact  Eileen Stanford, LCSW Phone Number: 06/30/2019, 9:54 AM  Clinical Narrative:  Pt has a bed at [facility], patient has insurance auth to admit to SNF. Pt will not d/c today due to need for continued medical workup. CSW will continue to follow up with MD to determine medical stability.   Anticipate d/c tomorrow [facility] is unable to transport today or tomorrow due to "lack of resources." [facility] states they have no availability today and to call back later to determine if they can transport tomorrow. It will be dependent upon the weather. [facility] has no availability today and states it will be dependent upon the weather tomorrow if they can take pt.  CSW will follow up with both [facility] and [facility] first thing in the morning.    Expected Discharge Plan: [place] Barriers to Discharge: Continued Medical Work up  Expected Discharge Plan and Services Expected Discharge Plan: [place] Choice: Resumption of Svcs/PTA Provider Living arrangements for the past 2 months: Single Family Home Expected Discharge Date: 06/29/19                         HH Arranged: PT, Nurse's Aide, Refused SNF [place] Agency: [facility] [employer] Date [place]: 06/28/19   Representative spoke with at [place]: [place] (SDOH) Interventions    Readmission Risk Interventions No flowsheet data found.

## 2019-06-30 NOTE — Plan of Care (Signed)
  Problem: Education: Goal: Knowledge of General Education information will improve Description: Including pain rating scale, medication(s)/side effects and non-pharmacologic comfort measures Outcome: Progressing   Problem: Health Behavior/Discharge Planning: Goal: Ability to manage health-related needs will improve Outcome: Progressing Note: Patient has a bed available at a [facility], however, he's unable to be transported until the AM, which may depend on the weather. Will continue to monitor overall progression for the remainder of the shift. Wenda Low [facility]

## 2019-06-30 NOTE — Progress Notes (Signed)
[name], male   DOB: [birth year], 79 y.o.   MRN: [id number] [facility] PROGRESS NOTE  Justin Paganini. QPY:[id number] DOB: 01/14/1941 DOA: 06/24/2019 PCP: Kirk Ruths, MD  HPI/Subjective: Patient feeling better.  States he does not eat well here in the hospital because he does not like the food.  Patient states he is ready to go home.  No cough.  No shortness of breath.  Objective: Vitals:   06/30/19 0307 06/30/19 0757  BP: (!) 144/72 137/79  Pulse: 61 62  Resp: 18 18  Temp: 98.2 F (36.8 C) 97.6 F (36.4 C)  SpO2: 97% 98%    Intake/Output Summary (Last 24 hours) at 06/30/2019 1405 Last data filed at 06/30/2019 0815 Gross per 24 hour  Intake --  Output 2150 ml  Net -2150 ml   Filed Weights   06/24/19 1938 06/25/19 2039 06/30/19 0307  Weight: 127 kg 128.7 kg 131.9 kg    ROS: Review of Systems  Constitutional: Positive for malaise/fatigue. Negative for chills and fever.  Eyes: Negative for blurred vision.  Respiratory: Negative for cough and shortness of breath.   Cardiovascular: Negative for chest pain.  Gastrointestinal: Negative for abdominal pain, constipation, diarrhea, nausea and vomiting.  Genitourinary: Negative for dysuria.  Musculoskeletal: Negative for joint pain.  Neurological: Negative for dizziness and headaches.   Exam: Physical Exam  HENT:  Nose: No mucosal edema.  Mouth/Throat: No oropharyngeal exudate or posterior oropharyngeal edema.  Eyes: Pupils are equal, round, and reactive to light. Conjunctivae, EOM and lids are normal.  Neck: Carotid bruit is not present.  Cardiovascular: S1 normal and S2 normal. Exam reveals no gallop.  No murmur heard. Pulses:      Dorsalis pedis pulses are 2+ on the right side and 2+ on the left side.  Respiratory: No respiratory distress. He has decreased breath sounds in the right lower field and the left lower field. He has no wheezes. He has no rhonchi. He has no rales.  GI: Soft.  Bowel sounds are normal. There is no abdominal tenderness.  Musculoskeletal:     Right ankle: Swelling present.     Left ankle: Swelling present.  Lymphadenopathy:    He has no cervical adenopathy.  Neurological: He is alert. No cranial nerve deficit.  Skin: Skin is warm. Nails show no clubbing.  Legs wrapped bilateral lower extremity  Psychiatric: He has a normal mood and affect.      Data Reviewed: Basic Metabolic Panel: Recent Labs  Lab 06/25/19 0607 06/27/19 0623 06/28/19 0524 06/29/19 0426 06/30/19 0557 06/30/19 1227  NA 137 137 136 136 141  --   K 4.7 4.9 5.2* 5.1 5.5* 5.6*  CL 105 107 105 105 103  --   CO2 21* 22 22 25 23   --   GLUCOSE 162* 147* 145* 168* 172*  --   BUN 46* 55* 64* 70* 72*  --   CREATININE 2.29* 1.94* 2.17* 2.30* 2.24*  --   CALCIUM 8.6* 8.5* 8.6* 8.9 9.4  --    Liver Function Tests: Recent Labs  Lab 06/24/19 2110  AST 47*  ALT 25  ALKPHOS 52  BILITOT 0.7  PROT 6.9  ALBUMIN 3.1*   CBC: Recent Labs  Lab 06/24/19 1956 06/27/19 0623 06/28/19 0524 06/29/19 0426  WBC 8.5 9.1 7.6 7.8  NEUTROABS 7.1 8.0* 6.3 6.3  HGB 9.1* 8.7* 8.7* 9.1*  HCT 26.8* 25.6* 26.5* 28.0*  MCV 67.8* 65.5* 69.4* 70.0*  PLT 179 179 200 223  CBG: Recent Labs  Lab 06/29/19 1133 06/29/19 1638 06/29/19 2004 06/30/19 0755 06/30/19 1155  GLUCAP 188* 183* 290* 153* 252*    Recent Results (from the past 240 hour(s))  Blood Culture (routine x 2)     Status: None   Collection Time: 06/24/19  7:57 PM   Specimen: BLOOD  Result Value Ref Range Status   Specimen Description BLOOD RIGHT ANTECUBITAL  Final   Special Requests   Final    BOTTLES DRAWN AEROBIC AND ANAEROBIC Blood Culture adequate volume   Culture   Final    NO GROWTH 5 DAYS Performed at [facility], [address] [place], [place] [address]    Report Status 06/29/2019 FINAL  Final  Blood Culture (routine x 2)     Status: None   Collection Time: 06/24/19  7:57 PM   Specimen: BLOOD   Result Value Ref Range Status   Specimen Description BLOOD RIGHT FOREARM  Final   Special Requests   Final    BOTTLES DRAWN AEROBIC AND ANAEROBIC Blood Culture adequate volume   Culture   Final    NO GROWTH 5 DAYS Performed at [facility], [address] [place], [place] [address]    Report Status 06/29/2019 FINAL  Final      Scheduled Meds: . allopurinol  100 mg Oral Daily  . vitamin C  500 mg Oral Daily  . dexamethasone  4 mg Oral Daily  . diltiazem  240 mg Oral Daily  . docusate sodium  100 mg Oral Daily  . feeding supplement (ENSURE ENLIVE)  237 mL Oral BID BM  . gabapentin  100 mg Oral TID  . insulin aspart  0-15 Units Subcutaneous TID WC  . insulin aspart  0-5 Units Subcutaneous QHS  . levETIRAcetam  500 mg Oral BID  . metoprolol succinate  50 mg Oral Daily  . Rivaroxaban  20 mg Oral Q supper  . sodium chloride flush  3 mL Intravenous Q12H  . sodium zirconium cyclosilicate  10 g Oral TID  . tamsulosin  0.4 mg Oral QPC breakfast   Continuous Infusions: . sodium chloride      Assessment/Plan:  1. Hyperkalemia.  This morning's potassium up at 5.5.  I started [facility] yesterday.  Repeat potassium up at 5.6 early afternoon.  We will increase Lokelma to 3 times daily for now and recheck in the morning.   2. Weakness.  Patient will go out to rehab tomorrow morning hopefully if potassium is better.  Insurance did not authorize. 3. COVID-19 pneumonia.  Finished course of remdesivir.  Continue to taper dexamethasone.  Continue vitamin C.  Patient also finished Rocephin and Zithromax 5-day course. 4. Acute hypoxic respiratory failure.  Patient tapered off oxygen 5. Chronic anemia.  Last hemoglobin 9.1 6. Acute kidney injury on chronic kidney disease stage IIIb.  7. DVT on Xarelto 8. Essential hypertension on metoprolol 9. BPH on Flomax 10. Last hemoglobin A1c 5.3.  I would not classify this patient as a diabetic at this time.  Sugars may be a little higher  secondary to steroids. 11. Seizure history on Keppra  Code Status:     Code Status Orders  (From admission, onward)         Start     Ordered   06/24/19 2136  Full code  Continuous     06/24/19 2136        Code Status History    This patient has a current code status but no  historical code status.   Advance Care Planning Activity     Family Communication: Daughter on the phone Disposition Plan: Patient to go to rehab once hyperkalemia is better controlled.  Hopefully tomorrow morning.  Time spent: 27 minutes  [place]

## 2019-06-30 NOTE — Progress Notes (Signed)
Physical Therapy Treatment Patient Details Name: Justin Shannon. MRN: [id number] DOB: Mar 22, 1941 Today's Date: 06/30/2019    History of Present Illness Pt is a 79 year old male presenting with SOB secondary to COVID. Arrived to ED with dyspnea and O2 sat of 88%. PMH lower extremity DVT, DM, HTN, and CKD.    PT Comments    Pt initially hesitant to do a lot citing feeling very weak and tired.  However with encouragement and cuing he was able to do better than he expected.  He was able to get to sitting with less assist this date, though still had some issues with shifting weight forward and generally to attain positioning to remain sitting w/o assist.  Pt struggled to get up each time (had more unsuccessful attempts than successful) but with increased cuing and assist (mostly to keep weight/momentum forward) he was able to rise to standing with mod assist multiple times t/o the session. Pt showed great effort but continues to understand that he is not safe to go home and will need STR to work back to a more functional level appropriate for home.  Follow Up Recommendations  SNF     Equipment Recommendations  Rolling walker with 5" wheels    Recommendations for Other Services       Precautions / Restrictions Precautions Precautions: Fall Restrictions Weight Bearing Restrictions: No    Mobility  Bed Mobility Overal bed mobility: Needs Assistance Bed Mobility: Sit to Supine;Supine to Sit     Supine to sit: Min assist Sit to supine: Mod assist   General bed mobility comments: Pt still needing light assist to get to sitting and more assist (mostly with LEs) to get back to bed but with great effort and use of rails he was able to get to EOB w/ only light assist  Transfers Overall transfer level: Needs assistance Equipment used: Rolling walker (2 wheeled) Transfers: Sit to/from Stand Sit to Stand: Mod assist         General transfer comment: multiple standing attempts this  date, pt unable to get up on his own, but with elevated surface and considerable amount of cuing for weight shift, set up, UE use and sequencing he was able to rise with only min assist.  He needed similar cuing in getting up from recliner but from the lower seat needed a heavy mod assist to keep weight shifting forward after 2 unsuccessful attempts to get up with less assist.    Ambulation/Gait Ambulation/Gait assistance: Min assist Gait Distance (Feet): 5 Feet Assistive device: Rolling walker (2 wheeled)       General Gait Details: Pt able to take a few turning steps from EOB to recliner on first standing/walking attempt.  After rest break in recliner he was able to walk ~5 ft with very slow and guarded gait and heavy encouragement/cuing and again turn and get back to bed.     Stairs             Wheelchair Mobility    Modified Rankin (Stroke Patients Only)       Balance Overall balance assessment: Needs assistance;History of Falls Sitting-balance support: Feet supported;Bilateral upper extremity supported Sitting balance-Leahy Scale: Fair Sitting balance - Comments: Once assisted to EOB he was able to maintain balance   Standing balance support: Bilateral upper extremity supported Standing balance-Leahy Scale: Poor Standing balance comment: Once standing and leaning b/l UEs on walker he was able to maintain upright, however each time in attempt at getting up  he needed heavy cuing (and at times heavy physical assist) to keep from pushing/leaning back.                             Cognition Arousal/Alertness: Awake/alert Behavior During Therapy: WFL for tasks assessed/performed Overall Cognitive Status: Within Functional Limits for tasks assessed                                        Exercises General Exercises - Lower Extremity Ankle Circles/Pumps: AROM;Both;20 reps Short Arc Quad: AROM;10 reps;Both Heel Slides: 10 reps;AROM(with resisted  leg extensions) Hip ABduction/ADduction: Strengthening;10 reps;Both Straight Leg Raises: AROM;5 reps;Both    General Comments        Pertinent Vitals/Pain Pain Assessment: (chronic R UE arthritic pain, minimal acute c/o pain)    Home Living                      Prior Function            PT Goals (current goals can now be found in the care plan section) Progress towards PT goals: Progressing toward goals    Frequency    Min 2X/week      PT Plan Current plan remains appropriate    Co-evaluation              AM-PAC PT "6 Clicks" Mobility   Outcome Measure  Help needed turning from your back to your side while in a flat bed without using bedrails?: A Little Help needed moving from lying on your back to sitting on the side of a flat bed without using bedrails?: A Little Help needed moving to and from a bed to a chair (including a wheelchair)?: A Lot Help needed standing up from a chair using your arms (e.g., wheelchair or bedside chair)?: A Lot Help needed to walk in hospital room?: A Lot Help needed climbing 3-5 steps with a railing? : Total 6 Click Score: 13    End of Session Equipment Utilized During Treatment: Gait belt Activity Tolerance: Patient limited by fatigue Patient left: with bed alarm set;with call bell/phone within reach Nurse Communication: Mobility status PT Visit Diagnosis: Unsteadiness on feet (R26.81);Difficulty in walking, not elsewhere classified (R26.2);Other abnormalities of gait and mobility (R26.89);Muscle weakness (generalized) (M62.81)     Time: 1165-7903 PT Time Calculation (min) (ACUTE ONLY): 40 min  Charges:  $Gait Training: 8-22 mins $Therapeutic Exercise: 8-22 mins $Therapeutic Activity: 8-22 mins                     Kreg Shropshire, DPT 06/30/2019, 5:22 PM

## 2019-06-30 NOTE — Plan of Care (Signed)
  Problem: Clinical Measurements: Goal: Respiratory complications will improve Outcome: Progressing Goal: Cardiovascular complication will be avoided Outcome: Progressing   Problem: Safety: Goal: Ability to remain free from injury will improve Outcome: Progressing   Problem: Coping: Goal: Psychosocial and spiritual needs will be supported Outcome: Progressing   Problem: Respiratory: Goal: Will maintain a patent airway Outcome: Progressing Goal: Complications related to the disease process, condition or treatment will be avoided or minimized Outcome: Progressing

## 2019-07-01 ENCOUNTER — Encounter (INDEPENDENT_AMBULATORY_CARE_PROVIDER_SITE_OTHER): Payer: Medicare Other

## 2019-07-01 DIAGNOSIS — G40909 Epilepsy, unspecified, not intractable, without status epilepticus: Secondary | ICD-10-CM

## 2019-07-01 DIAGNOSIS — N184 Chronic kidney disease, stage 4 (severe): Secondary | ICD-10-CM

## 2019-07-01 LAB — BASIC METABOLIC PANEL
Anion gap: 6 (ref 5–15)
BUN: 66 mg/dL — ABNORMAL HIGH (ref 8–23)
CO2: 23 mmol/L (ref 22–32)
Calcium: 9.1 mg/dL (ref 8.9–10.3)
Chloride: 107 mmol/L (ref 98–111)
Creatinine, Ser: 2.11 mg/dL — ABNORMAL HIGH (ref 0.61–1.24)
GFR calc Af Amer: 34 mL/min — ABNORMAL LOW (ref >60)
GFR calc non Af Amer: 29 mL/min — ABNORMAL LOW (ref >60)
Glucose, Bld: 176 mg/dL — ABNORMAL HIGH (ref 70–99)
Potassium: 5.2 mmol/L — ABNORMAL HIGH (ref 3.5–5.1)
Sodium: 136 mmol/L (ref 135–145)

## 2019-07-01 LAB — HEMOGLOBIN: Hemoglobin: 9.6 g/dL — ABNORMAL LOW (ref 13.0–17.0)

## 2019-07-01 LAB — GLUCOSE, CAPILLARY: Glucose-Capillary: 149 mg/dL — ABNORMAL HIGH (ref 70–99)

## 2019-07-01 MED ORDER — DILTIAZEM HCL ER COATED BEADS 120 MG PO CP24: 120.0000 mg | ORAL_CAPSULE | Freq: Every day | ORAL | Status: DC

## 2019-07-01 MED ORDER — DEXAMETHASONE 1 MG PO TABS: ORAL_TABLET | ORAL | 0 refills | Status: DC

## 2019-07-01 MED ORDER — DILTIAZEM HCL ER COATED BEADS 120 MG PO CP24: 120.0000 mg | ORAL_CAPSULE | Freq: Every day | ORAL | 0 refills | Status: DC

## 2019-07-01 MED ORDER — SODIUM ZIRCONIUM CYCLOSILICATE 10 G PO PACK: 10.0000 g | PACK | Freq: Two times a day (BID) | ORAL | 0 refills | Status: DC

## 2019-07-01 MED ORDER — ENSURE ENLIVE PO LIQD: 237.0000 mL | Freq: Two times a day (BID) | ORAL | 0 refills | Status: DC

## 2019-07-01 NOTE — Progress Notes (Signed)
[name], male   DOB: 1940-12-20, 79 y.o.   MRN: [id number]  Lab this morning not resulted yet.   Need to see K prior to dispo  Stat bmp and hb ordered  Dr Leslye Peer

## 2019-07-01 NOTE — Progress Notes (Signed)
Called report to [employer] at [facility]

## 2019-07-01 NOTE — Discharge Summary (Signed)
[place] [facility] NAME: Justin Shannon    MR#:  [id number]  DATE OF BIRTH:  27-May-1941  DATE OF ADMISSION:  06/24/2019 ADMITTING PHYSICIAN: Orene Desanctis, DO  DATE OF DISCHARGE: 07/01/2019  PRIMARY CARE PHYSICIAN: Kirk Ruths, MD    ADMISSION DIAGNOSIS:  Morbid obesity ([place]) [E66.01] Acute respiratory failure with hypoxia ([place]) [J96.01] Pneumonia due to COVID-19 virus [U07.1, J12.82]  DISCHARGE DIAGNOSIS:  Principal Problem:   Pneumonia due to COVID-19 virus Active Problems:   Epilepsy ([place])   History of DVT (deep vein thrombosis)   OSA on CPAP   Lymphedema   Type 2 diabetes mellitus with stage 4 chronic kidney disease, without long-term current use of insulin (HCC)   Respiratory failure with hypoxia (HCC)   Anemia due to stage 4 chronic kidney disease (HCC)   Essential hypertension   Generalized weakness   Anemia of chronic renal failure, stage 3a   Benign prostatic hyperplasia   Seizure (HCC)   Hyperkalemia   Acute kidney injury superimposed on CKD ([place])   SECONDARY DIAGNOSIS:   Past Medical History:  Diagnosis Date  . Arthritis   . Bilateral swelling of feet   . BPH (benign prostatic hyperplasia)   . Chronic kidney disease   . Diabetes mellitus without complication ([place])   . DVT (deep venous thrombosis) (HCC)    right lower extremity  . History of gout   . Hypertension   . Seizures ([place])   . Sleep apnea   . Tendonitis of foot    left    HOSPITAL COURSE:   1.  COVID-19 pneumonia.  The patient finished his course of remdesivir while here in the hospital.  He is written for a dexamethasone taper.  Continue vitamin C.  The patient was also treated with Rocephin and Zithromax for 5 days. 2.  Acute hypoxic respiratory failure.  The patient was on oxygen and tapered off.  This problem has resolved. 3.  Hyperkalemia.  [name] potassium has come down to 5.2.  Continue Lokelma twice a day to lower potassium.  Would  check BMP twice a week to ensure potassium is coming down.  Once potassium is in the 4's, can probably drop Lokelma down to once a day dosing.  If by any chance that you do not have Lokelma can use Kayexalate 30 g p.o. daily but this can cause diarrhea so I would rather use Lokelma. 4.  Weakness.  Physical therapy saw the patient and recommended rehab.  The family was on the fence at first on whether to take him home or to go to rehab but they eventually decided on going to rehab to get stronger. 5.  Chronic anemia.  Last hemoglobin 9.6. 6.  Acute kidney injury on chronic kidney disease stage IIIb.  Creatinine 2.11 upon discharge 7.  History of DVT on Xarelto 8.  Essential hypertension on metoprolol.  Will discontinue Cardizem CD secondary to relative bradycardia. 9.  BPH on Flomax. 10.  Last hemoglobin A1c 5.3.  Patient is not a diabetic at this time.  Sugars may be slightly higher secondary to steroids.  Can check fingersticks daily. 11.  Seizure history on Keppra 12.  Unna boots bilateral lower extremities to be wrapped weekly.  Can consider restarting the Lasix once BUN and creatinine improved a little bit.  DISCHARGE CONDITIONS:  Satisfactory  CONSULTS OBTAINED:  Physical therapy  DRUG ALLERGIES:   Allergies  Allergen Reactions  . Shellfish Allergy Other (See  Comments) and Anaphylaxis    Unknown     DISCHARGE MEDICATIONS:   Allergies as of 07/01/2019      Reactions   Shellfish Allergy Other (See Comments), Anaphylaxis   Unknown       Medication List    STOP taking these medications   Cardizem LA 240 MG 24 hr tablet Generic drug: diltiazem   furosemide 40 MG tablet Commonly known as: LASIX   methylPREDNISolone 4 MG Tbpk tablet Commonly known as: MEDROL DOSEPAK   olmesartan-hydrochlorothiazide 40-12.5 MG tablet Commonly known as: BENICAR HCT   traMADol 50 MG tablet Commonly known as: ULTRAM     TAKE these medications   Accu-Chek FastClix Lancets Misc USE TO  CHECK GLUCOSE TWICE DAILY   Accu-Chek Guide Me w/Device Kit 2 (two) times daily.   Accu-Chek Guide test strip Generic drug: glucose blood USE STRIP TO CHECK GLUCOSE TWICE DAILY   allopurinol 100 MG tablet Commonly known as: ZYLOPRIM Take 100 mg by mouth daily.   AZO BLADDER CONTROL/GO-LESS PO Take 1 capsule by mouth daily.   Colace 100 MG capsule Generic drug: docusate sodium Take 100 mg by mouth daily.   dexamethasone 1 MG tablet Commonly known as: DECADRON 4 tabs po day1; 3 tabs po day2,3; 1 tabs po day4,5; 1/2  tab po day 6,7   feeding supplement (ENSURE ENLIVE) Liqd Take 237 mLs by mouth 2 (two) times daily between meals.   fexofenadine 180 MG tablet Commonly known as: ALLEGRA Take 180 mg by mouth daily as needed.   fluticasone 50 MCG/ACT nasal spray Commonly known as: FLONASE Place 2 sprays into both nostrils daily as needed.   gabapentin 100 MG capsule Commonly known as: NEURONTIN Take 100 mg by mouth 3 (three) times daily.   levETIRAcetam 500 MG tablet Commonly known as: KEPPRA Take 500 mg by mouth 2 (two) times daily.   metoprolol succinate 50 MG 24 hr tablet Commonly known as: TOPROL-XL Take 50 mg by mouth daily.   ONE-A-DAY MENS PO Take by mouth daily.   sodium zirconium cyclosilicate 10 g Pack packet Commonly known as: LOKELMA Take 10 g by mouth 2 (two) times daily.   tamsulosin 0.4 MG Caps capsule Commonly known as: FLOMAX TAKE 1 CAPSULE BY MOUTH ONCE DAILY ONE HALF HOUR FOLLOWING THE SAME MEAL EACH DAY   External Pharmacy 05/18/2019   vitamin C 500 MG tablet Commonly known as: ASCORBIC ACID Take 500 mg by mouth daily.   Xarelto 20 MG Tabs tablet Generic drug: rivaroxaban Take 20 mg by mouth daily with supper.        DISCHARGE INSTRUCTIONS:   Follow-up Dr. At rehab 1 day  If you experience worsening of your admission symptoms, develop shortness of breath, life threatening emergency, suicidal or homicidal thoughts you must seek  medical attention immediately by calling 911 or calling your MD immediately  if symptoms less severe.  You Must read complete instructions/literature along with all the possible adverse reactions/side effects for all the Medicines you take and that have been prescribed to you. Take any new Medicines after you have completely understood and accept all the possible adverse reactions/side effects.   Please note  You were cared for by a hospitalist during your hospital stay. If you have any questions about your discharge medications or the care you received while you were in the hospital after you are discharged, you can call the unit and asked to speak with the hospitalist on call if the hospitalist that took care of  you is not available. Once you are discharged, your primary care physician will handle any further medical issues. Please note that NO REFILLS for any discharge medications will be authorized once you are discharged, as it is imperative that you return to your primary care physician (or establish a relationship with a primary care physician if you do not have one) for your aftercare needs so that they can reassess your need for medications and monitor your lab values.    Today   CHIEF COMPLAINT:   Chief Complaint  Patient presents with  . Shortness of Breath    HISTORY OF PRESENT ILLNESS:  Justin Shannon  is a 79 y.o. male presented with shortness of breath   VITAL SIGNS:  Blood pressure (!) 144/85, pulse (!) 58, temperature (!) 97.5 F (36.4 C), temperature source Oral, resp. rate 18, height 5' 10" (1.778 m), weight 131.2 kg, SpO2 99 %.  I/O:    Intake/Output Summary (Last 24 hours) at 07/01/2019 1046 Last data filed at 07/01/2019 0530 Gross per 24 hour  Intake --  Output 2250 ml  Net -2250 ml    PHYSICAL EXAMINATION:  GENERAL:  79 y.o.-year-old patient lying in the bed with no acute distress.  EYES: Pupils equal, round, reactive to light and accommodation. No scleral  icterus. HEENT: Head atraumatic, normocephalic. Oropharynx and nasopharynx clear.  NECK:  Supple, no jugular venous distention. No thyroid enlargement, no tenderness.  LUNGS: Decreased breath sounds bilaterally, no wheezing, rales,rhonchi or crepitation. No use of accessory muscles of respiration.  CARDIOVASCULAR: S1, S2 normal. No murmurs, rubs, or gallops.  ABDOMEN: Soft, non-tender, non-distended. Bowel sounds present. No organomegaly or mass.  EXTREMITIES: 2+ pedal edema, no cyanosis, or clubbing.  NEUROLOGIC: Cranial nerves II through XII are intact. Sensation intact. Gait not checked.  PSYCHIATRIC: The patient is alert and oriented x 3.  SKIN: Lower extremity legs wrapped with Unna boots  DATA REVIEW:   CBC Recent Labs  Lab 06/29/19 0426 07/01/19 0910  WBC 7.8  --   HGB 9.1* 9.6*  HCT 28.0*  --   PLT 223  --     Chemistries  Recent Labs  Lab 06/24/19 2110 07/01/19 0910  NA 135 136  K 4.2 5.2*  CL 106 107  CO2 20* 23  GLUCOSE 117* 176*  BUN 47* 66*  CREATININE 2.72* 2.11*  CALCIUM 8.7* 9.1  AST 47*  --   ALT 25  --   ALKPHOS 52  --   BILITOT 0.7  --      Microbiology Results  Results for orders placed or performed during the hospital encounter of 06/24/19  Blood Culture (routine x 2)     Status: None   Collection Time: 06/24/19  7:57 PM   Specimen: BLOOD  Result Value Ref Range Status   Specimen Description BLOOD RIGHT ANTECUBITAL  Final   Special Requests   Final    BOTTLES DRAWN AEROBIC AND ANAEROBIC Blood Culture adequate volume   Culture   Final    NO GROWTH 5 DAYS Performed at [facility], [address] [place], [place] [address]    Report Status 06/29/2019 FINAL  Final  Blood Culture (routine x 2)     Status: None   Collection Time: 06/24/19  7:57 PM   Specimen: BLOOD  Result Value Ref Range Status   Specimen Description BLOOD RIGHT FOREARM  Final   Special Requests   Final    BOTTLES DRAWN AEROBIC AND ANAEROBIC Blood Culture  adequate  volume   Culture   Final    NO GROWTH 5 DAYS Performed at [facility], [address] [place], [place] [address]    Report Status 06/29/2019 FINAL  Final      Management plans discussed with the patient, family and they are in agreement.  CODE STATUS:     Code Status Orders  (From admission, onward)         Start     Ordered   06/24/19 2136  Full code  Continuous     06/24/19 2136        Code Status History    This patient has a current code status but no historical code status.   Advance Care Planning Activity      TOTAL TIME TAKING CARE OF THIS PATIENT: 35 minutes.    Loletha Grayer M.D on 07/01/2019 at 10:46 AM  Between 7am to 6pm - Pager - [phone]  After 6pm go to [email or web address] - password [email or web address]  [facility]  CC: Primary care physician; Kirk Ruths, MD

## 2019-07-01 NOTE — Discharge Instructions (Signed)

## 2019-07-01 NOTE — Progress Notes (Signed)
[name], male   DOB: 01-26-1941, 79 y.o.   MRN: [id number]  Called lab to draw blood.

## 2019-07-01 NOTE — TOC Progression Note (Addendum)
Transition of Care (TOC) - Progression Note    Patient Details  Name: Benton Tooker. MRN: [id number] Date of Birth: [birth year]  Transition of [facility]) CM/SW Contact  Eileen Stanford, LCSW Phone Number: 07/01/2019, 8:35 AM  Clinical Narrative:  CSW contacted [facility], they are ready to transport patient however, pt's labs have not been drawn. Unsure if EMS will be able to transport after labs will have to call to see if they have a truck available.  Labs are back, MD is going to d/c. Ambulance is en route.       Expected Discharge Plan: [place] Barriers to Discharge: Continued Medical Work up  Expected Discharge Plan and Services Expected Discharge Plan: [place] Choice: Resumption of Svcs/PTA Provider Living arrangements for the past 2 months: Single Family Home Expected Discharge Date: 06/29/19                         HH Arranged: PT, Nurse's Aide, Refused SNF [place] [facility]: [facility] Date [place]: 06/28/19   Representative spoke with at [place]: [place] (SDOH) Interventions    Readmission Risk Interventions No flowsheet data found.

## 2019-07-01 NOTE — TOC Transition Note (Signed)
Transition of Care [facility]) - CM/SW Discharge Note   Patient Details  Name: Resean Brander. MRN: [id number] Date of Birth: [birth year]  Transition of Care [facility]) CM/SW Contact:  Eileen Stanford, LCSW Phone Number: 07/01/2019, 10:28 AM   Clinical Narrative:   Clinical Social Worker facilitated patient discharge including contacting patient family and facility to confirm patient discharge plans.  Clinical information faxed to facility and family agreeable with plan.  CSW arranged ambulance transport via [facility] to [place].  RN to call [phone]  for report prior to discharge.  Final next level of care: Skilled Nursing Facility Barriers to Discharge: No Barriers Identified   Patient Goals and CMS Choice Patient states their goals for this hospitalization and ongoing recovery are:: Patient not fully oriented.   Choice offered to / list presented to : NA  Discharge Placement              Patient chooses bed at: [facility]) Patient to be transferred to facility by: [place] Name of family member notified: [place] Patient and family notified of of transfer: 07/01/19  Discharge Plan and Services     Post Acute Care Choice: Resumption of Svcs/PTA Provider                    HH Arranged: PT, Nurse's Aide, Refused SNF [place] [facility] Date [place]: 06/28/19   Representative spoke with at [place]: [place] ([place]) Interventions     Readmission Risk Interventions No flowsheet data found.

## 2019-07-01 NOTE — Care Management Important Message (Signed)
Important Message  Patient Details  Name: Justin Shannon. MRN: [id number] Date of Birth: [birth year]   Medicare Important Message Given:  Yes  Spoke with wife over phone for another verbal reminder of Medicare IM.  Confirmed she received the copy mailed by Patient Access.     Justin Shannon 07/01/2019, 11:29 AM

## 2019-07-08 ENCOUNTER — Ambulatory Visit (INDEPENDENT_AMBULATORY_CARE_PROVIDER_SITE_OTHER): Payer: Medicare Other | Admitting: Nurse Practitioner

## 2019-07-15 ENCOUNTER — Other Ambulatory Visit: Payer: Self-pay

## 2019-07-15 ENCOUNTER — Encounter (INDEPENDENT_AMBULATORY_CARE_PROVIDER_SITE_OTHER): Payer: Self-pay | Admitting: Nurse Practitioner

## 2019-07-15 ENCOUNTER — Ambulatory Visit (INDEPENDENT_AMBULATORY_CARE_PROVIDER_SITE_OTHER): Payer: Medicare Other | Admitting: Nurse Practitioner

## 2019-07-15 VITALS — BP 167/89 | HR 71 | Resp 16 | Wt 286.8 lb

## 2019-07-15 DIAGNOSIS — R531 Weakness: Secondary | ICD-10-CM | POA: Diagnosis not present

## 2019-07-15 DIAGNOSIS — I890 Lymphedema, not elsewhere classified: Secondary | ICD-10-CM | POA: Diagnosis not present

## 2019-07-18 ENCOUNTER — Telehealth (INDEPENDENT_AMBULATORY_CARE_PROVIDER_SITE_OTHER): Payer: Self-pay

## 2019-07-18 NOTE — Telephone Encounter (Signed)
Verbal orders were giving to [facility]  at [facility] [phone] for weekly bilateral unna wrap changes.

## 2019-07-20 ENCOUNTER — Encounter (INDEPENDENT_AMBULATORY_CARE_PROVIDER_SITE_OTHER): Payer: Self-pay | Admitting: Nurse Practitioner

## 2019-07-20 NOTE — Progress Notes (Signed)
SUBJECTIVE:  Patient ID: Justin Shannon., male    DOB: 02-27-1941, 79 y.o.   MRN: [id number] Chief Complaint  Patient presents with  . Follow-up    unna check    HPI  [facility] is a 79 y.o. male returns to the office for evaluation of of lower extremity edema and ulceration.  The patient has not been in office in several weeks to have his wraps changed as he recently had COVID-19.  The patient was in the hospital for extended time followed up by a rehab facility.  He continued to have his wraps done during the rehab facility however it was not consistent and his legs exhibit more swelling today.  The patient still has considerable weakness and shortness of breath following discharge.  He denies any recent fever, chills, nausea, vomiting or diarrhea.  Overall he is slowly regaining his strength.  Past Medical History:  Diagnosis Date  . Arthritis   . Bilateral swelling of feet   . BPH (benign prostatic hyperplasia)   . Chronic kidney disease   . Diabetes mellitus without complication ([place])   . DVT (deep venous thrombosis) (HCC)    right lower extremity  . History of gout   . Hypertension   . Seizures ([place])   . Sleep apnea   . Tendonitis of foot    left    Past Surgical History:  Procedure Laterality Date  . APPENDECTOMY    . HERNIA REPAIR    . PROSTATE SURGERY      Social History   Socioeconomic History  . Marital status: Married    Spouse name: Not on file  . Number of children: Not on file  . Years of education: Not on file  . Highest education level: Not on file  Occupational History  . Not on file  Tobacco Use  . Smoking status: Former Smoker    Packs/day: 1.00    Years: 2.00    Pack years: 2.00    Types: Cigarettes    Quit date: 06/23/1968    Years since quitting: 51.1  . Smokeless tobacco: Never Used  . Tobacco comment: 25 years ago quit  Substance and Sexual Activity  . Alcohol use: No  . Drug use: No  . Sexual activity: Not on file  Other  Topics Concern  . Not on file  Social History Narrative  . Not on file   Social Determinants of Health   Financial Resource Strain:   . Difficulty of Paying Living Expenses: Not on file  Food Insecurity:   . Worried About Charity fundraiser in the Last Year: Not on file  . Ran Out of Food in the Last Year: Not on file  Transportation Needs:   . Lack of Transportation (Medical): Not on file  . Lack of Transportation (Non-Medical): Not on file  Physical Activity:   . Days of Exercise per Week: Not on file  . Minutes of Exercise per Session: Not on file  Stress:   . Feeling of Stress : Not on file  Social Connections:   . Frequency of Communication with Friends and Family: Not on file  . Frequency of Social Gatherings with Friends and Family: Not on file  . Attends Religious Services: Not on file  . Active Member of Clubs or Organizations: Not on file  . Attends Archivist Meetings: Not on file  . Marital Status: Not on file  Intimate Partner Violence:   . Fear of Current  or Ex-Partner: Not on file  . Emotionally Abused: Not on file  . Physically Abused: Not on file  . Sexually Abused: Not on file    Family History  Problem Relation Age of Onset  . Arthritis Mother   . Hypertension Mother   . Diabetes Brother     Allergies  Allergen Reactions  . Shellfish Allergy Other (See Comments) and Anaphylaxis    Unknown      Review of Systems   Review of Systems: Negative Unless Checked Constitutional: _0 Weight loss  _1 Fever  _2 Chills Cardiac: _3 Chest pain   _4  Atrial Fibrillation  _5 Palpitations   _6 Shortness of breath when laying flat   _7 Shortness of breath with exertion. _8 Shortness of breath at rest Vascular:  _9 Pain in legs with walking   _10 Pain in legs with standing _11 Pain in legs when laying flat   _12 Claudication    _13 Pain in feet when laying flat    _14 History of DVT   _15 Phlebitis   _16 Swelling in legs   _17 Varicose veins   _18 Non-healing ulcers Pulmonary:    _19 Uses home oxygen   _20 Productive cough   _21 Hemoptysis   _22 Wheeze  _23 COPD   _24 Asthma Neurologic:  _25 Dizziness   _26 Seizures  _27 Blackouts _28 History of stroke   _29 History of TIA  _30 Aphasia   _31 Temporary Blindness   _32 Weakness or numbness in arm   _33 Weakness or numbness in leg Musculoskeletal:   _34 Joint swelling   _35 Joint pain   _36 Low back pain  _37  History of Knee Replacement _38 Arthritis _39 back Surgeries  _40  Spinal Stenosis    Hematologic:  _41 Easy bruising  _42 Easy bleeding   _43 Hypercoagulable state   _44 Anemic Gastrointestinal:  _45 Diarrhea   _46 Vomiting  _47 Gastroesophageal reflux/heartburn   _48 Difficulty swallowing. _49 Abdominal pain Genitourinary:  _50 Chronic kidney disease   _51 Difficult urination  _52 Anuric   _53 Blood in urine _54 Frequent urination  _55 Burning with urination   _56 Hematuria Skin:  _57 Rashes   _58 Ulcers _59 Wounds Psychological:  _60 History of anxiety   _61  History of major depression  _62  Memory Difficulties      OBJECTIVE:   Physical Exam  BP (!) 167/89 (BP Location: Right Arm)   Pulse 71   Resp 16   Wt 286 lb 12.8 oz (130.1 kg)   BMI 41.15 kg/m   Gen: WD/WN, NAD Head: [place]/AT, No temporalis wasting.  Ear/Nose/Throat: Hearing grossly intact, nares w/o erythema or drainage Eyes: PER, EOMI, sclera nonicteric.  Neck: Supple, no masses.  No JVD.  Pulmonary:  Good air movement, no use of accessory muscles.  Cardiac: RRR Vascular:  3+ edema bilaterally with some small shallow ulcerations bilaterally Vessel Right Left  Dorsalis Pedis Palpable Palpable  Posterior Tibial Palpable Palpable   Gastrointestinal: soft, non-distended. No guarding/no peritoneal signs.  Musculoskeletal: M/S 5/5 throughout.  No deformity or atrophy.  Neurologic: Pain and light touch intact in extremities.  Symmetrical.  Speech is fluent. Motor exam as listed above. Psychiatric: Judgment intact, Mood & affect appropriate for pt's clinical situation. Dermatologic:  Bilateral stasis dermatitis. No Ulcers Noted.  No  changes consistent with cellulitis. Lymph : No Cervical lymphadenopathy, lichenification with dermal thickening bilaterally       ASSESSMENT AND PLAN:  1. Lymphedema Patient recently was discharged from a nursing facility due to an extended hospitalization from COVID-19.  Based on this the patient's leg swelling has worsened somewhat so we will place the patient back in bilateral Unna wraps.  He will be changed on a weekly basis and will continue to elevate his lower extremities.  The patient is  also urged to continue use of his lymphedema pump at least on a nightly basis.  We will have him back in office in 4 weeks to reevaluate progress.  2. Generalized weakness Based on the patient's generalized weakness it is felt that home health would be a better solution for the patient as he is having some difficulty with coming back and forth to the office.  We will facilitate this for the patient.   Current Outpatient Medications on File Prior to Visit  Medication Sig Dispense Refill  . Accu-Chek FastClix Lancets MISC USE TO CHECK GLUCOSE TWICE DAILY    . ACCU-CHEK GUIDE test strip USE STRIP TO CHECK GLUCOSE TWICE DAILY    . allopurinol (ZYLOPRIM) 100 MG tablet Take 100 mg by mouth daily.    . Blood Glucose Monitoring Suppl (ACCU-CHEK GUIDE ME) w/Device KIT 2 (two) times daily.    Marland Kitchen dexamethasone (DECADRON) 1 MG tablet 4 tabs po day1; 3 tabs po day2,3; 1 tabs po day4,5; 1/2  tab po day 6,7 13 tablet 0  . docusate sodium (COLACE) 100 MG capsule Take 100 mg by mouth daily.    . feeding supplement, ENSURE ENLIVE, (ENSURE ENLIVE) LIQD Take 237 mLs by mouth 2 (two) times daily between meals. 14220 mL 0  . fexofenadine (ALLEGRA) 180 MG tablet Take 180 mg by mouth daily as needed.    . fluticasone (FLONASE) 50 MCG/ACT nasal spray Place 2 sprays into both nostrils daily as needed.     . gabapentin (NEURONTIN) 100 MG capsule Take 100 mg by mouth 3 (three) times daily.    Marland Kitchen levETIRAcetam (KEPPRA) 500 MG  tablet Take 500 mg by mouth 2 (two) times daily.     . metoprolol succinate (TOPROL-XL) 50 MG 24 hr tablet Take 50 mg by mouth daily.     . Multiple Vitamin (ONE-A-DAY MENS PO) Take by mouth daily.    . Pumpkin Seed-Soy Germ (AZO BLADDER CONTROL/GO-LESS PO) Take 1 capsule by mouth daily.    . rivaroxaban (XARELTO) 20 MG TABS tablet Take 20 mg by mouth daily with supper.     . sodium zirconium cyclosilicate (LOKELMA) 10 g PACK packet Take 10 g by mouth 2 (two) times daily. 60 packet 0  . tamsulosin (FLOMAX) 0.4 MG CAPS capsule TAKE 1 CAPSULE BY MOUTH ONCE DAILY ONE HALF HOUR FOLLOWING THE SAME MEAL [facility] DAY   External Pharmacy 05/18/2019    . vitamin C (ASCORBIC ACID) 500 MG tablet Take 500 mg by mouth daily.      No current facility-administered medications on file prior to visit.    There are no Patient Instructions on file for this visit. No follow-ups on file.   Kris Hartmann, NP  This note was completed with Sales executive.  Any errors are purely unintentional.

## 2019-07-26 ENCOUNTER — Encounter: Payer: Self-pay | Admitting: *Deleted

## 2019-07-28 ENCOUNTER — Ambulatory Visit: Payer: Medicare Other | Admitting: Podiatry

## 2019-08-01 ENCOUNTER — Ambulatory Visit (INDEPENDENT_AMBULATORY_CARE_PROVIDER_SITE_OTHER): Payer: Medicare Other | Admitting: Vascular Surgery

## 2019-08-03 ENCOUNTER — Ambulatory Visit: Payer: Medicare Other | Admitting: Urology

## 2019-08-03 ENCOUNTER — Other Ambulatory Visit: Payer: Self-pay

## 2019-08-03 ENCOUNTER — Encounter: Payer: Self-pay | Admitting: Urology

## 2019-08-03 VITALS — BP 142/88 | HR 76 | Ht 70.0 in | Wt 286.0 lb

## 2019-08-03 DIAGNOSIS — R32 Unspecified urinary incontinence: Secondary | ICD-10-CM

## 2019-08-03 DIAGNOSIS — R339 Retention of urine, unspecified: Secondary | ICD-10-CM

## 2019-08-03 LAB — URINALYSIS, COMPLETE
Bilirubin, UA: NEGATIVE
Glucose, UA: NEGATIVE
Ketones, UA: NEGATIVE
Leukocytes,UA: NEGATIVE
Nitrite, UA: NEGATIVE
Protein,UA: NEGATIVE
RBC, UA: NEGATIVE
Specific Gravity, UA: 1.015 (ref 1.005–1.030)
Urobilinogen, Ur: 0.2 mg/dL (ref 0.2–1.0)
pH, UA: 5.5 (ref 5.0–7.5)

## 2019-08-03 LAB — MICROSCOPIC EXAMINATION
Bacteria, UA: NONE SEEN
RBC, Urine: NONE SEEN /hpf (ref 0–2)
WBC, UA: NONE SEEN /hpf (ref 0–5)

## 2019-08-03 LAB — BLADDER SCAN AMB NON-IMAGING

## 2019-08-03 NOTE — Progress Notes (Signed)
08/03/2019 3:22 PM   [place] Jr. 1941-01-12 [id number]  Referring provider: Kirk Ruths, MD [facility] [place],  [place] [address]  Chief Complaint  Patient presents with  . Urinary Incontinence    HPI: 79 year old male who presents for further evaluation of enuresis.  He started having this issue in December and around the time that he was hospitalized for Covid.  He was hospitalized for about 3 days and then went to rehab for an additional 2 weeks.  During this time period, he did not have his CPAP and was acutely ill.  He reports that he started waking up when having incontinence specifically at nighttime.  He had no daytime symptoms.  This is not occurred again in the past 3 weeks.  He has no daytime symptoms no urgency frequency or dysuria.  He feels like he is emptying his bladder well.  No recent infections.  He does get up at night 1-2 times which is stable and unchanged.  This is not bothersome to him.  PVR is mildly elevated today 200 cc.  Most recent PSA 1.88 11/2017  IPSS    Row Name 08/03/19 1100         International Prostate Symptom Score   How often have you had the sensation of not emptying your bladder?  Not at All     How often have you had to urinate less than every two hours?  Less than half the time     How often have you found you stopped and started again several times when you urinated?  Less than 1 in 5 times     How often have you found it difficult to postpone urination?  Less than 1 in 5 times     How often have you had a weak urinary stream?  Less than 1 in 5 times     How often have you had to strain to start urination?  Not at All     How many times did you typically get up at night to urinate?  2 Times     Total IPSS Score  7       Quality of Life due to urinary symptoms   If you were to spend the rest of your life with your urinary condition just the way it is now how would you feel about  that?  Delighted        Score:  1-7 Mild 8-19 Moderate 20-35 Severe    PMH: Past Medical History:  Diagnosis Date  . Arthritis   . Bilateral swelling of feet   . BPH (benign prostatic hyperplasia)   . Chronic kidney disease   . Diabetes mellitus without complication ([place])   . DVT (deep venous thrombosis) (HCC)    right lower extremity  . History of gout   . Hypertension   . Seizures ([place])   . Sleep apnea   . Tendonitis of foot    left    Surgical History: Past Surgical History:  Procedure Laterality Date  . APPENDECTOMY    . HERNIA REPAIR    . PROSTATE SURGERY      Home Medications:  Allergies as of 08/03/2019      Reactions   Shellfish Allergy Other (See Comments), Anaphylaxis   Unknown       Medication List       Accurate as of August 03, 2019 11:59 PM. If you have any questions, ask your nurse  or doctor.        STOP taking these medications   tamsulosin 0.4 MG Caps capsule Commonly known as: FLOMAX Stopped by: Hollice Espy, MD     TAKE these medications   Accu-Chek FastClix Lancets Misc USE TO CHECK GLUCOSE TWICE DAILY   Accu-Chek Guide Me w/Device Kit 2 (two) times daily.   Accu-Chek Guide test strip Generic drug: glucose blood USE STRIP TO CHECK GLUCOSE TWICE DAILY   allopurinol 100 MG tablet Commonly known as: ZYLOPRIM Take 100 mg by mouth daily.   AZO BLADDER CONTROL/GO-LESS PO Take 1 capsule by mouth daily.   Colace 100 MG capsule Generic drug: docusate sodium Take 100 mg by mouth daily.   dexamethasone 1 MG tablet Commonly known as: DECADRON 4 tabs po day1; 3 tabs po day2,3; 1 tabs po day4,5; 1/2  tab po day 6,7   feeding supplement (ENSURE ENLIVE) Liqd Take 237 mLs by mouth 2 (two) times daily between meals.   fexofenadine 180 MG tablet Commonly known as: ALLEGRA Take 180 mg by mouth daily as needed.   fluticasone 50 MCG/ACT nasal spray Commonly known as: FLONASE Place 2 sprays into both nostrils daily as  needed.   gabapentin 100 MG capsule Commonly known as: NEURONTIN Take 100 mg by mouth 3 (three) times daily.   levETIRAcetam 500 MG tablet Commonly known as: KEPPRA Take 500 mg by mouth 2 (two) times daily.   metoprolol succinate 50 MG 24 hr tablet Commonly known as: TOPROL-XL Take 50 mg by mouth daily.   ONE-A-DAY MENS PO Take by mouth daily.   sodium zirconium cyclosilicate 10 g Pack packet Commonly known as: LOKELMA Take 10 g by mouth 2 (two) times daily.   vitamin C 500 MG tablet Commonly known as: ASCORBIC ACID Take 500 mg by mouth daily.   Xarelto 20 MG Tabs tablet Generic drug: rivaroxaban Take 20 mg by mouth daily with supper.       Allergies:  Allergies  Allergen Reactions  . Shellfish Allergy Other (See Comments) and Anaphylaxis    Unknown     Family History: Family History  Problem Relation Age of Onset  . Arthritis Mother   . Hypertension Mother   . Diabetes Brother     Social History:  reports that he quit smoking about 51 years ago. His smoking use included cigarettes. He has a 2.00 pack-year smoking history. He has never used smokeless tobacco. He reports that he does not drink alcohol or use drugs.   Physical Exam: BP (!) 142/88   Pulse 76   Ht 5' 10"  (1.778 m)   Wt 286 lb (129.7 kg)   BMI 41.04 kg/m   Constitutional:  Alert and oriented, No acute distress.  In wheelchair, elderly appearing. HEENT: [place] AT, moist mucus membranes.  Trachea midline, no masses. Cardiovascular: Bilateral lower extremity edema appreciated Respiratory: Normal respiratory effort, no increased work of breathing. Skin: No rashes, bruises or suspicious lesions. Neurologic: Grossly intact, no focal deficits, moving all 4 extremities. Psychiatric: Normal mood and affect.  Laboratory Data: Lab Results  Component Value Date   WBC 7.8 06/29/2019   HGB 9.6 (L) 07/01/2019   HCT 28.0 (L) 06/29/2019   MCV 70.0 (L) 06/29/2019   PLT 223 06/29/2019    Lab Results   Component Value Date   CREATININE 2.11 (H) 07/01/2019     Lab Results  Component Value Date   HGBA1C 5.3 06/25/2019    Urinalysis Results for orders placed or performed in visit  on 08/03/19  Microscopic Examination   URINE  Result Value Ref Range   WBC, UA None seen 0 - 5 /hpf   RBC None seen 0 - 2 /hpf   Epithelial Cells (non renal) 0-10 0 - 10 /hpf   Bacteria, UA None seen None seen/Few  Urinalysis, Complete  Result Value Ref Range   Specific Gravity, UA 1.015 1.005 - 1.030   pH, UA 5.5 5.0 - 7.5   Color, UA Yellow Yellow   Appearance Ur Clear Clear   Leukocytes,UA Negative Negative   Protein,UA Negative Negative/Trace   Glucose, UA Negative Negative   Ketones, UA Negative Negative   RBC, UA Negative Negative   Bilirubin, UA Negative Negative   Urobilinogen, Ur 0.2 0.2 - 1.0 mg/dL   Nitrite, UA Negative Negative   Microscopic Examination See below:   Bladder Scan (Post Void Residual) in office  Result Value Ref Range   Scan Result 274m     Pertinent Imaging: PVR 200   Assessment & Plan:    1. Enuresis Temporally associated with acute medical illness in the form of severe COVID-19 infection and rehabilitation  All symptoms have resolved over the past 3 to 4 weeks this suspected was related to medical illness than a true bladder issue.  Additionally, he is not able to use his CPAP during this time which may have also been a contributing factor.  Given incomplete bladder emptying today, I am also somewhat suspicious that he could have also had overflow incontinence during this time however he had no other urinary symptoms.  Urine today is reassuring.  At this point we will hold off on any further intervention or evaluation.  He is advised to return or contact u[place]oreaif his enuresis recurs. - Urinalysis, Complete - Bladder Scan (Post Void Residual) in office  2. Incomplete bladder emptying Asymptomatic incidental incomplete bladder emptying  Get he has no  voiding complaints, pleasant to start him on Flomax which would contribute to the polypharmacy and possibly cause issues with orthostasis  We discussed having him come back in about 6 weeks after he is recovered more fully for repeat bladder check to ensure that he is emptying  Currently has no sequela other than CKD which is likely multifactorial and less likely related to outlet obstruction   Return in about 6 weeks (around 09/14/2019) for PVR.  AHollice Espy MD  [facility] [address] [place] [phone]

## 2019-08-12 ENCOUNTER — Ambulatory Visit (INDEPENDENT_AMBULATORY_CARE_PROVIDER_SITE_OTHER): Payer: Medicare Other | Admitting: Nurse Practitioner

## 2019-08-15 ENCOUNTER — Ambulatory Visit (INDEPENDENT_AMBULATORY_CARE_PROVIDER_SITE_OTHER): Payer: Medicare Other | Admitting: Nurse Practitioner

## 2019-08-15 ENCOUNTER — Encounter (INDEPENDENT_AMBULATORY_CARE_PROVIDER_SITE_OTHER): Payer: Self-pay | Admitting: Nurse Practitioner

## 2019-08-15 ENCOUNTER — Other Ambulatory Visit: Payer: Self-pay

## 2019-08-15 VITALS — BP 147/83 | HR 64 | Resp 16 | Ht 70.0 in | Wt 273.0 lb

## 2019-08-15 DIAGNOSIS — I890 Lymphedema, not elsewhere classified: Secondary | ICD-10-CM | POA: Diagnosis not present

## 2019-08-15 DIAGNOSIS — I10 Essential (primary) hypertension: Secondary | ICD-10-CM

## 2019-08-15 DIAGNOSIS — M1711 Unilateral primary osteoarthritis, right knee: Secondary | ICD-10-CM

## 2019-08-15 NOTE — Progress Notes (Signed)
SUBJECTIVE:  Patient ID: Justin Shannon., male    DOB: [birth year], 79 y.o.   MRN: [id number] Chief Complaint  Patient presents with  . Follow-up    unna check    HPI  Justin Shannon. is a 79 y.o. male presents today for bilateral Unna wrap checks.  The patient has been having his [place] wraps done by home health.  He has been tolerating the wraps well.  The patient also had COVID-19 and he reports feeling much better since we last saw him.  The patient seems to be recovering much better as well.  He denies any fever, chills, nausea, vomiting or diarrhea.  He states that he does not have any pain in his bilateral lower extremities except for at his right knee.  Previously he had had some aching in his legs when he was lying down however with the recent addition of the wraps they are feeling much better.  Past Medical History:  Diagnosis Date  . Arthritis   . Bilateral swelling of feet   . BPH (benign prostatic hyperplasia)   . Chronic kidney disease   . Diabetes mellitus without complication ([place])   . DVT (deep venous thrombosis) (HCC)    right lower extremity  . History of gout   . Hypertension   . Seizures ([place])   . Sleep apnea   . Tendonitis of foot    left    Past Surgical History:  Procedure Laterality Date  . APPENDECTOMY    . HERNIA REPAIR    . PROSTATE SURGERY      Social History   Socioeconomic History  . Marital status: Married    Spouse name: Not on file  . Number of children: Not on file  . Years of education: Not on file  . Highest education level: Not on file  Occupational History  . Not on file  Tobacco Use  . Smoking status: Former Smoker    Packs/day: 1.00    Years: 2.00    Pack years: 2.00    Types: Cigarettes    Quit date: 06/23/1968    Years since quitting: 51.1  . Smokeless tobacco: Never Used  . Tobacco comment: 25 years ago quit  Substance and Sexual Activity  . Alcohol use: No  . Drug use: No  . Sexual activity: Not on file  Other  Topics Concern  . Not on file  Social History Narrative  . Not on file   Social Determinants of Health   Financial Resource Strain:   . Difficulty of Paying Living Expenses: Not on file  Food Insecurity:   . Worried About Charity fundraiser in the Last Year: Not on file  . Ran Out of Food in the Last Year: Not on file  Transportation Needs:   . Lack of Transportation (Medical): Not on file  . Lack of Transportation (Non-Medical): Not on file  Physical Activity:   . Days of Exercise per Week: Not on file  . Minutes of Exercise per Session: Not on file  Stress:   . Feeling of Stress : Not on file  Social Connections:   . Frequency of Communication with Friends and Family: Not on file  . Frequency of Social Gatherings with Friends and Family: Not on file  . Attends Religious Services: Not on file  . Active Member of Clubs or Organizations: Not on file  . Attends Archivist Meetings: Not on file  . Marital Status: Not on file  Intimate Partner Violence:   . Fear of Current or Ex-Partner: Not on file  . Emotionally Abused: Not on file  . Physically Abused: Not on file  . Sexually Abused: Not on file    Family History  Problem Relation Age of Onset  . Arthritis Mother   . Hypertension Mother   . Diabetes Brother     Allergies  Allergen Reactions  . Shellfish Allergy Other (See Comments) and Anaphylaxis    Unknown      Review of Systems   Review of Systems: Negative Unless Checked Constitutional: [] Weight loss  [] Fever  [] Chills Cardiac: [] Chest pain   []  Atrial Fibrillation  [] Palpitations   [] Shortness of breath when laying flat   [] Shortness of breath with exertion. [] Shortness of breath at rest Vascular:  [] Pain in legs with walking   [] Pain in legs with standing [] Pain in legs when laying flat   [] Claudication    [] Pain in feet when laying flat    [] History of DVT   [] Phlebitis   [x] Swelling in legs   [] Varicose veins   [] Non-healing ulcers Pulmonary:    [] Uses home oxygen   [] Productive cough   [] Hemoptysis   [] Wheeze  [] COPD   [] Asthma Neurologic:  [] Dizziness   [] Seizures  [] Blackouts [] History of stroke   [] History of TIA  [] Aphasia   [] Temporary Blindness   [] Weakness or numbness in arm   [] Weakness or numbness in leg Musculoskeletal:   [x] Joint swelling   [] Joint pain   [] Low back pain  []  History of Knee Replacement [x] Arthritis [] back Surgeries  []  Spinal Stenosis    Hematologic:  [] Easy bruising  [] Easy bleeding   [] Hypercoagulable state   [] Anemic Gastrointestinal:  [] Diarrhea   [] Vomiting  [] Gastroesophageal reflux/heartburn   [] Difficulty swallowing. [] Abdominal pain Genitourinary:  [] Chronic kidney disease   [] Difficult urination  [] Anuric   [] Blood in urine [] Frequent urination  [] Burning with urination   [] Hematuria Skin:  [] Rashes   [] Ulcers [] Wounds Psychological:  [] History of anxiety   []  History of major depression  []  Memory Difficulties      OBJECTIVE:   Physical Exam  BP (!) 147/83 (BP Location: Left Arm)   Pulse 64   Resp 16   Ht 5' 10"  (1.778 m)   Wt 273 lb (123.8 kg)   BMI 39.17 kg/m   Gen: WD/WN, NAD Head: [place]/AT, No temporalis wasting.  Ear/Nose/Throat: Hearing grossly intact, nares w/o erythema or drainage Eyes: PER, EOMI, sclera nonicteric.  Neck: Supple, no masses.  No JVD.  Pulmonary:  Good air movement, no use of accessory muscles.  Cardiac: RRR Vascular:  2+ edema bilaterally Vessel Right Left  Radial Palpable Palpable   Gastrointestinal: soft, non-distended. No guarding/no peritoneal signs.  Musculoskeletal: M/S 5/5 throughout.  No deformity or atrophy.  Neurologic: Pain and light touch intact in extremities.  Symmetrical.  Speech is fluent. Motor exam as listed above. Psychiatric: Judgment intact, Mood & affect appropriate for pt's clinical situation. Dermatologic: No Venous rashes. No Ulcers Noted.  No changes consistent with cellulitis. Lymph : No Cervical lymphadenopathy, lichenification  right lower extremity      ASSESSMENT AND PLAN:  1. Lymphedema Today the patient's lower extremity edema and ulcerations look markedly improved.  The patient has had numerous issues with new ulcerations.  We will continue to have the patient in [place] wraps for the next 4 weeks.  He is having his wraps done by home health.  If the patient has improved swelling at the next visit we  may consider a break for short time.  Patient will also continue with elevation of his bilateral lower extremities.  He will continue to use his lymphedema pump on a daily basis as well.  2. Primary osteoarthritis of right knee I do feel that a portion of the patient's swelling is due to the arthritis of his knee.  Patient does state that his knee is giving him some pain.  Patient has upcoming appointment to evaluate for possible knee replacement options.  3. Essential hypertension Patient has good blood pressure control today.  Patient on appropriate medications.  No changes made today.   Current Outpatient Medications on File Prior to Visit  Medication Sig Dispense Refill  . Accu-Chek FastClix Lancets MISC USE TO CHECK GLUCOSE TWICE DAILY    . ACCU-CHEK GUIDE test strip USE STRIP TO CHECK GLUCOSE TWICE DAILY    . allopurinol (ZYLOPRIM) 100 MG tablet Take 100 mg by mouth daily.    . Blood Glucose Monitoring Suppl (ACCU-CHEK GUIDE ME) w/Device KIT 2 (two) times daily.    Marland Kitchen dexamethasone (DECADRON) 1 MG tablet 4 tabs po day1; 3 tabs po day2,3; 1 tabs po day4,5; 1/2  tab po day 6,7 13 tablet 0  . docusate sodium (COLACE) 100 MG capsule Take 100 mg by mouth daily.    . feeding supplement, ENSURE ENLIVE, (ENSURE ENLIVE) LIQD Take 237 mLs by mouth 2 (two) times daily between meals. 14220 mL 0  . fexofenadine (ALLEGRA) 180 MG tablet Take 180 mg by mouth daily as needed.    . fluticasone (FLONASE) 50 MCG/ACT nasal spray Place 2 sprays into both nostrils daily as needed.     . gabapentin (NEURONTIN) 100 MG capsule Take 100  mg by mouth 3 (three) times daily.    Marland Kitchen levETIRAcetam (KEPPRA) 500 MG tablet Take 500 mg by mouth 2 (two) times daily.     . metoprolol succinate (TOPROL-XL) 50 MG 24 hr tablet Take 50 mg by mouth daily.     . Multiple Vitamin (ONE-A-DAY MENS PO) Take by mouth daily.    . Pumpkin Seed-Soy Germ (AZO BLADDER CONTROL/GO-LESS PO) Take 1 capsule by mouth daily.    . rivaroxaban (XARELTO) 20 MG TABS tablet Take 20 mg by mouth daily with supper.     . sodium zirconium cyclosilicate (LOKELMA) 10 g PACK packet Take 10 g by mouth 2 (two) times daily. 60 packet 0  . torsemide (DEMADEX) 20 MG tablet Take 20 mg by mouth daily.    . vitamin C (ASCORBIC ACID) 500 MG tablet Take 500 mg by mouth daily.      No current facility-administered medications on file prior to visit.    There are no Patient Instructions on file for this visit. No follow-ups on file.   Kris Hartmann, NP  This note was completed with Sales executive.  Any errors are purely unintentional.

## 2019-09-08 ENCOUNTER — Ambulatory Visit: Payer: Medicare Other | Admitting: Podiatry

## 2019-09-08 ENCOUNTER — Other Ambulatory Visit: Payer: Self-pay

## 2019-09-08 ENCOUNTER — Encounter: Payer: Self-pay | Admitting: Podiatry

## 2019-09-08 DIAGNOSIS — B351 Tinea unguium: Secondary | ICD-10-CM | POA: Diagnosis not present

## 2019-09-08 DIAGNOSIS — N183 Chronic kidney disease, stage 3 unspecified: Secondary | ICD-10-CM | POA: Diagnosis not present

## 2019-09-08 DIAGNOSIS — D689 Coagulation defect, unspecified: Secondary | ICD-10-CM

## 2019-09-08 DIAGNOSIS — E1122 Type 2 diabetes mellitus with diabetic chronic kidney disease: Secondary | ICD-10-CM | POA: Diagnosis not present

## 2019-09-08 DIAGNOSIS — N184 Chronic kidney disease, stage 4 (severe): Secondary | ICD-10-CM

## 2019-09-08 DIAGNOSIS — M79609 Pain in unspecified limb: Secondary | ICD-10-CM

## 2019-09-08 NOTE — Progress Notes (Signed)
This patient returns to my office for at risk foot care.  This patient requires this care by a professional since this patient will be at risk due to having  Venous insufficiency , DVT, Diabetes and  CKD-4  This patient is unable to cut nails himself since the patient cannot reach his nails.These nails are painful walking and wearing shoes.  This patient presents for at risk foot care today.  Patient is taking xarelto.  General Appearance  Alert, conversant and in no acute stress.  Vascular  Dorsalis pedis and posterior tibial  pulses are palpable  bilaterally.  Capillary return is within normal limits  bilaterally. Temperature is within normal limits  bilaterally.  Neurologic  Senn-Weinstein monofilament wire test within normal limits  bilaterally. Muscle power within normal limits bilaterally.  Nails Thick disfigured discolored nails with subungual debris  from hallux to fifth toes bilaterally. No evidence of bacterial infection or drainage bilaterally.  Orthopedic  No limitations of motion  feet .  No crepitus or effusions noted.  No bony pathology or digital deformities noted.  Skin  normotropic skin with no porokeratosis noted bilaterally.  No signs of infections or ulcers noted.     Onychomycosis  Pain in right toes  Pain in left toes  Consent was obtained for treatment procedures.   Mechanical debridement of nails 1-5  bilaterally performed with a nail nipper.  Filed with dremel without incident. No infection or ulcer.  Iatrogenic lesion left hallux.   Return office visit   4 months                   Told patient to return for periodic foot care and evaluation due to potential at risk complications.   Gardiner Barefoot DPM

## 2019-09-12 ENCOUNTER — Other Ambulatory Visit: Payer: Self-pay

## 2019-09-12 ENCOUNTER — Encounter (INDEPENDENT_AMBULATORY_CARE_PROVIDER_SITE_OTHER): Payer: Self-pay | Admitting: Nurse Practitioner

## 2019-09-12 ENCOUNTER — Ambulatory Visit (INDEPENDENT_AMBULATORY_CARE_PROVIDER_SITE_OTHER): Payer: Medicare Other | Admitting: Nurse Practitioner

## 2019-09-12 VITALS — BP 145/80 | HR 91 | Resp 20 | Ht 70.0 in | Wt 272.0 lb

## 2019-09-12 DIAGNOSIS — M1711 Unilateral primary osteoarthritis, right knee: Secondary | ICD-10-CM | POA: Diagnosis not present

## 2019-09-12 DIAGNOSIS — M79604 Pain in right leg: Secondary | ICD-10-CM | POA: Diagnosis not present

## 2019-09-12 DIAGNOSIS — I890 Lymphedema, not elsewhere classified: Secondary | ICD-10-CM

## 2019-09-14 ENCOUNTER — Ambulatory Visit: Payer: Medicare Other | Admitting: Physician Assistant

## 2019-09-14 ENCOUNTER — Encounter: Payer: Self-pay | Admitting: Physician Assistant

## 2019-09-14 ENCOUNTER — Other Ambulatory Visit: Payer: Self-pay

## 2019-09-14 VITALS — BP 125/77 | HR 58 | Ht 70.0 in | Wt 273.0 lb

## 2019-09-14 DIAGNOSIS — R339 Retention of urine, unspecified: Secondary | ICD-10-CM | POA: Diagnosis not present

## 2019-09-14 LAB — BLADDER SCAN AMB NON-IMAGING

## 2019-09-14 NOTE — Progress Notes (Signed)
09/14/2019 10:21 AM   Coralie Keens Jr. 23-Jul-1940 [id number]  CC: Incomplete bladder emptying follow-up  HPI: Justin Shannon. is a 79 y.o. male who presents today for follow-up of incomplete bladder emptying.  He was seen by Dr. Erlene Quan in clinic on 08/03/2019 for evaluation of nocturnal enuresis in the setting of COVID-19 hospitalization and lack of access to CPAP.  Symptoms resolved on their own without further intervention.  PVR at that visit was found to be 200 mL; patient denied voiding complaints.  Flomax was deferred at that time pending PVR recheck in approximately 6 weeks due to concerns for polypharmacy and orthostasis.  Today, he reports no urinary concerns.  He states he has a good urinary flow, has not had any more episodes of enuresis, and is feeling "great" overall.  PVR 44m.  PMH: Past Medical History:  Diagnosis Date  . Arthritis   . Bilateral swelling of feet   . BPH (benign prostatic hyperplasia)   . Chronic kidney disease   . [place]   . DVT (deep venous thrombosis) (HCC)    right lower extremity  . History of gout   . Hypertension   . [place]izures ([place]   . Sleep apnea   . Tendonitis of foot    left    Surgical History: Past Surgical History:  Procedure Laterality Date  . APPENDECTOMY    . HERNIA REPAIR    . PROSTATE SURGERY      Home Medications:  Allergies as of 09/14/2019      Reactions   Shellfish Allergy Other (See Comments), Anaphylaxis   Unknown       Medication List       Accurate as of September 14, 2019 10:21 AM. If you have any questions, ask your nurse or doctor.        STOP taking these medications   AZO BLADDER CONTROL/GO-LESS PO Stopped by: SDebroah Loop PA-C   dexamethasone 1 MG tablet Commonly known as: DECADRON Stopped by: SDebroah Loop PA-C   feeding supplement (ENSURE ENLIVE) Liqd Stopped by: SDebroah Loop PA-C   sodium zirconium cyclosilicate 10 g  Pack packet Commonly known as: LOKELMA Stopped by: SDebroah Loop PA-C     TAKE these medications   Accu-Chek FastClix Lancets Misc USE TO CHECK GLUCOSE TWICE DAILY   Accu-Chek Guide Me w/Device Kit 2 (two) times daily.   Accu-Chek Guide test strip Generic drug: glucose blood USE STRIP TO CHECK GLUCOSE TWICE DAILY   allopurinol 100 MG tablet Commonly known as: ZYLOPRIM Take 100 mg by mouth daily.   Colace 100 MG capsule Generic drug: docusate sodium Take 100 mg by mouth daily.   fexofenadine 180 MG tablet Commonly known as: ALLEGRA Take 180 mg by mouth daily as needed.   fluticasone 50 MCG/ACT nasal spray Commonly known as: FLONASE Place 2 sprays into both nostrils daily as needed.   gabapentin 100 MG capsule Commonly known as: NEURONTIN Take 100 mg by mouth 3 (three) times daily.   levETIRAcetam 500 MG tablet Commonly known as: KEPPRA Take 500 mg by mouth 2 (two) times daily.   metoprolol succinate 50 MG 24 hr tablet Commonly known as: TOPROL-XL Take 50 mg by mouth daily.   ONE-A-DAY MENS PO Take by mouth daily.   torsemide 20 MG tablet Commonly known as: DEMADEX Take 20 mg by mouth daily.   vitamin C 500 MG tablet Commonly known as: ASCORBIC ACID Take 500 mg by mouth daily.   Xarelto 20  MG Tabs tablet Generic drug: rivaroxaban Take 20 mg by mouth daily with supper.       Allergies:  Allergies  Allergen Reactions  . Shellfish Allergy Other (See Comments) and Anaphylaxis    Unknown     Family History: Family History  Problem Relation Age of Onset  . Arthritis Mother   . Hypertension Mother   . Diabetes Brother     Social History:   reports that he quit smoking about 51 years ago. His smoking use included cigarettes. He has a 2.00 pack-year smoking history. He has never used smokeless tobacco. He reports that he does not drink alcohol or use drugs.  Physical Exam: BP 125/77   Pulse (!) 58   Ht _0  (1.778 m)   Wt 273 lb  (123.8 kg)   BMI 39.17 kg/m   Constitutional:  Alert and oriented, no acute distress, nontoxic appearing HEENT: [place], AT Cardiovascular: No clubbing, cyanosis, or edema Respiratory: Normal respiratory effort, no increased work of breathing Skin: No rashes, bruises or suspicious lesions Neurologic: Grossly intact, no focal deficits, moving all 4 extremities Psychiatric: Normal mood and affect  Laboratory Data: Results for orders placed or performed in visit on 09/14/19  BLADDER SCAN AMB NON-IMAGING  Result Value Ref Range   Scan Result 31m    Assessment & Plan:   1. Incomplete bladder emptying 79year old male with a recent history of enuresis and incomplete bladder emptying in the setting of COVID-19 hospitalization.  We previously elected to defer pharmacotherapy for incomplete bladder emptying due to concerns for orthostasis and polypharmacy.  PVR WNL today and significantly decreased since his last visit.  No further intervention indicated.  In the absence of other urinary symptoms, will release from care at this time.  Counseled patient to follow-up in our clinic as needed.  We will be happy to see him.  He expressed understanding. - BLADDER SCAN AMB NON-IMAGING   Return if symptoms worsen or fail to improve.  SDebroah Loop PA-C  [facility] [address] [place] [phone]

## 2019-09-16 ENCOUNTER — Encounter (INDEPENDENT_AMBULATORY_CARE_PROVIDER_SITE_OTHER): Payer: Self-pay | Admitting: Nurse Practitioner

## 2019-09-16 ENCOUNTER — Other Ambulatory Visit (INDEPENDENT_AMBULATORY_CARE_PROVIDER_SITE_OTHER): Payer: Self-pay | Admitting: Nurse Practitioner

## 2019-09-16 NOTE — Progress Notes (Signed)
SUBJECTIVE:  Patient ID: Justin Shannon., male    DOB: 1941/01/14, 79 y.o.   MRN: [id number] Chief Complaint  Patient presents with  . Follow-up    BIL unna check    HPI  [facility]. is a 79 y.o. male presents today for evaluation of his lower extremity edema and ulceration.  The patient has been getting bilateral Unna wraps done at home via home health.  Today the patient's left lower extremity has minimal edema with no ulceration.  The right lower extremity still has some edema however the ulceration is healed.  The patient does have some dry skin over the area but no open ulceration.  He denies any fever, chills, nausea, vomiting or diarrhea.  Patient is concerned because there has been some more pain in his right lower extremity.  The patient does have known knee issues but the pain seems to be radiating more from his hip.  The patient has been wanting to undergo knee surgery however there was concern for his ulceration on his right lower extremity.  Past Medical History:  Diagnosis Date  . Arthritis   . Bilateral swelling of feet   . BPH (benign prostatic hyperplasia)   . Chronic kidney disease   . Diabetes mellitus without complication ([place])   . DVT (deep venous thrombosis) (HCC)    right lower extremity  . History of gout   . Hypertension   . Seizures ([place])   . Sleep apnea   . Tendonitis of foot    left    Past Surgical History:  Procedure Laterality Date  . APPENDECTOMY    . HERNIA REPAIR    . PROSTATE SURGERY      Social History   Socioeconomic History  . Marital status: Married    Spouse name: Not on file  . Number of children: Not on file  . Years of education: Not on file  . Highest education level: Not on file  Occupational History  . Not on file  Tobacco Use  . Smoking status: Former Smoker    Packs/day: 1.00    Years: 2.00    Pack years: 2.00    Types: Cigarettes    Quit date: 06/23/1968    Years since quitting: 51.2  . Smokeless tobacco:  Never Used  . Tobacco comment: 25 years ago quit  Substance and Sexual Activity  . Alcohol use: No  . Drug use: No  . Sexual activity: Not on file  Other Topics Concern  . Not on file  Social History Narrative  . Not on file   Social Determinants of Health   Financial Resource Strain:   . Difficulty of Paying Living Expenses:   Food Insecurity:   . Worried About Charity fundraiser in the Last Year:   . Arboriculturist in the Last Year:   Transportation Needs:   . Film/video editor (Medical):   Marland Kitchen Lack of Transportation (Non-Medical):   Physical Activity:   . Days of Exercise per Week:   . Minutes of Exercise per Session:   Stress:   . Feeling of Stress :   Social Connections:   . Frequency of Communication with Friends and Family:   . Frequency of Social Gatherings with Friends and Family:   . Attends Religious Services:   . Active Member of Clubs or Organizations:   . Attends Archivist Meetings:   Marland Kitchen Marital Status:   Intimate Partner Violence:   . Fear  of Current or Ex-Partner:   . Emotionally Abused:   Marland Kitchen Physically Abused:   . Sexually Abused:     Family History  Problem Relation Age of Onset  . Arthritis Mother   . Hypertension Mother   . Diabetes Brother     Allergies  Allergen Reactions  . Shellfish Allergy Other (See Comments) and Anaphylaxis    Unknown      Review of Systems   Review of Systems: Negative Unless Checked Constitutional: [] Weight loss  [] Fever  [] Chills Cardiac: [] Chest pain   []  Atrial Fibrillation  [] Palpitations   [] Shortness of breath when laying flat   [] Shortness of breath with exertion. [] Shortness of breath at rest Vascular:  [] Pain in legs with walking   [] Pain in legs with standing [] Pain in legs when laying flat   [] Claudication    [] Pain in feet when laying flat    [x] History of DVT   [] Phlebitis   [x] Swelling in legs   [] Varicose veins   [] Non-healing ulcers Pulmonary:   [] Uses home oxygen   [] Productive  cough   [] Hemoptysis   [] Wheeze  [] COPD   [] Asthma Neurologic:  [] Dizziness   [x] Seizures  [] Blackouts [] History of stroke   [] History of TIA  [] Aphasia   [] Temporary Blindness   [] Weakness or numbness in arm   [] Weakness or numbness in leg Musculoskeletal:   [x] Joint swelling   [x] Joint pain   [] Low back pain  []  History of Knee Replacement [] Arthritis [] back Surgeries  []  Spinal Stenosis    Hematologic:  [] Easy bruising  [] Easy bleeding   [] Hypercoagulable state   [] Anemic Gastrointestinal:  [] Diarrhea   [] Vomiting  [] Gastroesophageal reflux/heartburn   [] Difficulty swallowing. [] Abdominal pain Genitourinary:  [] Chronic kidney disease   [] Difficult urination  [] Anuric   [] Blood in urine [] Frequent urination  [] Burning with urination   [] Hematuria Skin:  [] Rashes   [] Ulcers [] Wounds Psychological:  [] History of anxiety   []  History of major depression  []  Memory Difficulties      OBJECTIVE:   Physical Exam  BP (!) 145/80   Pulse 91   Resp 20   Ht 5' 10"  (1.778 m)   Wt 272 lb (123.4 kg)   BMI 39.03 kg/m   Gen: WD/WN, NAD Head: [place]/AT, No temporalis wasting.  Ear/Nose/Throat: Hearing grossly intact, nares w/o erythema or drainage Eyes: PER, EOMI, sclera nonicteric.  Neck: Supple, no masses.  No JVD.  Pulmonary:  Good air movement, no use of accessory muscles.  Cardiac: RRR Vascular:  Minimal edema left lower extremity 2+ edema on right Vessel Right Left  Radial Palpable Palpable  Dorsalis Pedis  not palpable  not palpable  Posterior Tibial  not palpable  movement palpable   Gastrointestinal: soft, non-distended. No guarding/no peritoneal signs.  Musculoskeletal: M/S 5/5 throughout.  Ambulates with cane.  Swollen right knee Neurologic: Pain and light touch intact in extremities.  Symmetrical.  Speech is fluent. Motor exam as listed above. Psychiatric: Judgment intact, Mood & affect appropriate for pt's clinical situation. Dermatologic: No Venous rashes. No Ulcers Noted.  No  changes consistent with cellulitis. Lymph : No Cervical lymphadenopathy, lichenification on right lower extremity       ASSESSMENT AND PLAN:  1. Lymphedema Leg ulceration essentially healed however still has some significant issues and swelling and edema.  This is likely due to previous DVT in the lower extremity as well as some swelling in his knee that is contributing.  We will plan on keeping the patient in [place] wraps on his  right lower extremity for control of edema.  Based on this the patient should be able to move forward with surgery on his knee.  Patient will follow up in the office in 4 weeks for evaluation of lower extremity edema.  Patient will also continue to utilize his lymphedema pump as well as other conservative therapy tactics such as elevation.  2. Pain of right lower extremity Pain in right lower extremity is likely related to knee issues however because the pain radiates and causes difficulty with walking there is a concern prior to surgery that it could be related to arterial issues.  This could also be related to some post phlebitic symptoms as well.  In order to rule out arterial causes of pain and discomfort we will have the patient return with ABIs at his follow-up appointment.  No medication changes needed at this time.  3. Primary osteoarthritis of right knee Continue NSAID medications as already ordered, these medications have been reviewed and there are no changes at this time.  Continued activity and therapy was stressed.    Current Outpatient Medications on File Prior to Visit  Medication Sig Dispense Refill  . docusate sodium (COLACE) 100 MG capsule Take 100 mg by mouth daily.    . fexofenadine (ALLEGRA) 180 MG tablet Take 180 mg by mouth daily as needed.    . fluticasone (FLONASE) 50 MCG/ACT nasal spray Place 2 sprays into both nostrils daily as needed.     . levETIRAcetam (KEPPRA) 500 MG tablet Take 500 mg by mouth 2 (two) times daily.     . metoprolol  succinate (TOPROL-XL) 50 MG 24 hr tablet Take 50 mg by mouth daily.     . Multiple Vitamin (ONE-A-DAY MENS PO) Take by mouth daily.    . rivaroxaban (XARELTO) 20 MG TABS tablet Take 20 mg by mouth daily with supper.     . torsemide (DEMADEX) 20 MG tablet Take 20 mg by mouth daily.    . vitamin C (ASCORBIC ACID) 500 MG tablet Take 500 mg by mouth daily.     . Accu-Chek FastClix Lancets MISC USE TO CHECK GLUCOSE TWICE DAILY    . ACCU-CHEK GUIDE test strip USE STRIP TO CHECK GLUCOSE TWICE DAILY    . allopurinol (ZYLOPRIM) 100 MG tablet Take 100 mg by mouth daily.    . Blood Glucose Monitoring Suppl (ACCU-CHEK GUIDE ME) w/Device KIT 2 (two) times daily.    Marland Kitchen gabapentin (NEURONTIN) 100 MG capsule Take 100 mg by mouth 3 (three) times daily.     No current facility-administered medications on file prior to visit.    There are no Patient Instructions on file for this visit. No follow-ups on file.   Kris Hartmann, NP  This note was completed with Sales executive.  Any errors are purely unintentional.

## 2019-10-04 ENCOUNTER — Other Ambulatory Visit (INDEPENDENT_AMBULATORY_CARE_PROVIDER_SITE_OTHER): Payer: Self-pay | Admitting: Nurse Practitioner

## 2019-10-04 DIAGNOSIS — L97909 Non-pressure chronic ulcer of unspecified part of unspecified lower leg with unspecified severity: Secondary | ICD-10-CM

## 2019-10-04 DIAGNOSIS — I890 Lymphedema, not elsewhere classified: Secondary | ICD-10-CM

## 2019-10-06 ENCOUNTER — Ambulatory Visit (INDEPENDENT_AMBULATORY_CARE_PROVIDER_SITE_OTHER): Payer: Medicare Other

## 2019-10-06 ENCOUNTER — Other Ambulatory Visit: Payer: Self-pay

## 2019-10-06 ENCOUNTER — Encounter (INDEPENDENT_AMBULATORY_CARE_PROVIDER_SITE_OTHER): Payer: Self-pay | Admitting: Vascular Surgery

## 2019-10-06 ENCOUNTER — Ambulatory Visit (INDEPENDENT_AMBULATORY_CARE_PROVIDER_SITE_OTHER): Payer: Medicare Other | Admitting: Vascular Surgery

## 2019-10-06 VITALS — BP 132/70 | HR 64 | Resp 16 | Ht 69.0 in | Wt 271.0 lb

## 2019-10-06 DIAGNOSIS — I872 Venous insufficiency (chronic) (peripheral): Secondary | ICD-10-CM

## 2019-10-06 DIAGNOSIS — M1711 Unilateral primary osteoarthritis, right knee: Secondary | ICD-10-CM | POA: Diagnosis not present

## 2019-10-06 DIAGNOSIS — I890 Lymphedema, not elsewhere classified: Secondary | ICD-10-CM | POA: Diagnosis not present

## 2019-10-06 DIAGNOSIS — L97909 Non-pressure chronic ulcer of unspecified part of unspecified lower leg with unspecified severity: Secondary | ICD-10-CM | POA: Diagnosis not present

## 2019-10-06 DIAGNOSIS — I825Y9 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: Secondary | ICD-10-CM

## 2019-10-06 DIAGNOSIS — I739 Peripheral vascular disease, unspecified: Secondary | ICD-10-CM

## 2019-10-10 ENCOUNTER — Telehealth (INDEPENDENT_AMBULATORY_CARE_PROVIDER_SITE_OTHER): Payer: Self-pay

## 2019-10-10 NOTE — Telephone Encounter (Signed)
Spoke with the patient and he is now scheduled with Dr. Delana Meyer for a IVC filter placement on 10/25/19 with a 6:45 am arrival time to the [facility]. Patient will do his covid test on 10/21/19 between 8-1 pm at the [facility]. Pre-procedure instructions were discussed and will be mailed to the patient.

## 2019-10-13 ENCOUNTER — Encounter (INDEPENDENT_AMBULATORY_CARE_PROVIDER_SITE_OTHER): Payer: Self-pay | Admitting: Vascular Surgery

## 2019-10-13 DIAGNOSIS — I739 Peripheral vascular disease, unspecified: Secondary | ICD-10-CM | POA: Insufficient documentation

## 2019-10-13 NOTE — Progress Notes (Signed)
MRN : [id number]  Justin Shannon. is a 79 y.o. (Dec 31, 1940) male who presents with chief complaint of  Chief Complaint  Patient presents with  . Follow-up    3wks abi   .  History of Present Illness:    The patient presents to the office for evaluation of DVT.  DVT was identified at [facility] by Duplex ultrasound.  The initial symptoms were pain and swelling in the lower extremity.  The patient notes the leg continues to be very painful with dependency and swells quite a bite.  Symptoms are much better with elevation.  The patient notes minimal edema in the morning which steadily worsens throughout the day.    The patient has not been using compression therapy at this point.  No SOB or pleuritic chest pains.  No cough or hemoptysis.  No blood per rectum or blood in any sputum.  No excessive bruising per the patient.   Current Meds  Medication Sig  . Accu-Chek FastClix Lancets MISC USE TO CHECK GLUCOSE TWICE DAILY  . ACCU-CHEK GUIDE test strip USE STRIP TO CHECK GLUCOSE TWICE DAILY  . allopurinol (ZYLOPRIM) 100 MG tablet Take 100 mg by mouth daily.  . Blood Glucose Monitoring Suppl (ACCU-CHEK GUIDE ME) w/Device KIT 2 (two) times daily.  Marland Kitchen docusate sodium (COLACE) 100 MG capsule Take 100 mg by mouth daily.  . fexofenadine (ALLEGRA) 180 MG tablet Take 180 mg by mouth daily as needed for allergies.   . fluticasone (FLONASE) 50 MCG/ACT nasal spray Place 2 sprays into both nostrils daily as needed for allergies.   Marland Kitchen levETIRAcetam (KEPPRA) 500 MG tablet Take 500 mg by mouth 2 (two) times daily.   . metoprolol succinate (TOPROL-XL) 50 MG 24 hr tablet Take 50 mg by mouth daily.   . Multiple Vitamin (ONE-A-DAY MENS PO) Take 1 tablet by mouth daily.   . rivaroxaban (XARELTO) 20 MG TABS tablet Take 20 mg by mouth daily.   Marland Kitchen torsemide (DEMADEX) 20 MG tablet Take 20 mg by mouth daily.  . vitamin C (ASCORBIC ACID) 500 MG tablet Take 500 mg by mouth daily.   . [DISCONTINUED] gabapentin  (NEURONTIN) 100 MG capsule Take 100 mg by mouth 3 (three) times daily.    Past Medical History:  Diagnosis Date  . Arthritis   . Bilateral swelling of feet   . BPH (benign prostatic hyperplasia)   . Chronic kidney disease   . Diabetes mellitus without complication ([place])   . DVT (deep venous thrombosis) (HCC)    right lower extremity  . History of gout   . Hypertension   . Seizures ([place])   . Sleep apnea   . Tendonitis of foot    left    Past Surgical History:  Procedure Laterality Date  . APPENDECTOMY    . HERNIA REPAIR    . PROSTATE SURGERY      Social History Social History   Tobacco Use  . Smoking status: Former Smoker    Packs/day: 1.00    Years: 2.00    Pack years: 2.00    Types: Cigarettes    Quit date: 06/23/1968    Years since quitting: 51.3  . Smokeless tobacco: Never Used  . Tobacco comment: 25 years ago quit  Substance Use Topics  . Alcohol use: No  . Drug use: No    Family History Family History  Problem Relation Age of Onset  . Arthritis Mother   . Hypertension Mother   . Diabetes Brother  Allergies  Allergen Reactions  . Shellfish Allergy Other (See Comments) and Anaphylaxis    Unknown      REVIEW OF SYSTEMS (Negative unless checked)  Constitutional: [] Weight loss  [] Fever  [] Chills Cardiac: [] Chest pain   [] Chest pressure   [] Palpitations   [] Shortness of breath when laying flat   [] Shortness of breath with exertion. Vascular:  [x] Pain in legs with walking   [] Pain in legs at rest  [x] History of DVT   [] Phlebitis   [] Swelling in legs   [] Varicose veins   [] Non-healing ulcers Pulmonary:   [] Uses home oxygen   [] Productive cough   [] Hemoptysis   [] Wheeze  [] COPD   [] Asthma Neurologic:  [] Dizziness   [] Seizures   [] History of stroke   [] History of TIA  [] Aphasia   [] Vissual changes   [] Weakness or numbness in arm   [] Weakness or numbness in leg Musculoskeletal:   [] Joint swelling   [x] Joint pain   [] Low back pain Hematologic:  [] Easy  bruising  [] Easy bleeding   [] Hypercoagulable state   [] Anemic Gastrointestinal:  [] Diarrhea   [] Vomiting  [] Gastroesophageal reflux/heartburn   [] Difficulty swallowing. Genitourinary:  [x] Chronic kidney disease   [] Difficult urination  [] Frequent urination   [] Blood in urine Skin:  [] Rashes   [] Ulcers  Psychological:  [] History of anxiety   []  History of major depression.  Physical Examination  Vitals:   10/06/19 1526  BP: 132/70  Pulse: 64  Resp: 16  Weight: 271 lb (122.9 kg)  Height: 5' 9"  (1.753 m)   Body mass index is 40.02 kg/m. Gen: WD/WN, NAD Head: [place]/AT, No temporalis wasting.  Ear/Nose/Throat: Hearing grossly intact, nares w/o erythema or drainage Eyes: PER, EOMI, sclera nonicteric.  Neck: Supple, no large masses.   Pulmonary:  Good air movement, no audible wheezing bilaterally, no use of accessory muscles.  Cardiac: RRR, no JVD Vascular: scattered varicosities present bilaterally.  severe venous stasis changes to the legs bilaterally.  3-4+ soft pitting edema Vessel Right Left  Radial Palpable Palpable  PT Not Palpable Not Palpable  DP Not Palpable Not Palpable  Gastrointestinal: Non-distended. No guarding/no peritoneal signs.  Musculoskeletal: M/S 5/5 throughout.  No deformity or atrophy.  Neurologic: CN 2-12 intact. Symmetrical.  Speech is fluent. Motor exam as listed above. Psychiatric: Judgment intact, Mood & affect appropriate for pt's clinical situation. Dermatologic: severe venous rashes no ulcers noted.  No changes consistent with cellulitis. Lymph : + lichenification severe skin changes of chronic lymphedema.  CBC Lab Results  Component Value Date   WBC 7.8 06/29/2019   HGB 9.6 (L) 07/01/2019   HCT 28.0 (L) 06/29/2019   MCV 70.0 (L) 06/29/2019   PLT 223 06/29/2019    BMET    Component Value Date/Time   NA 136 07/01/2019 0910   NA 140 10/23/2012 0519   K 5.2 (H) 07/01/2019 0910   K 4.4 10/23/2012 0519   CL 107 07/01/2019 0910   CL 109 (H)  10/23/2012 0519   CO2 23 07/01/2019 0910   CO2 24 10/23/2012 0519   GLUCOSE 176 (H) 07/01/2019 0910   GLUCOSE 144 (H) 10/23/2012 0519   BUN 66 (H) 07/01/2019 0910   BUN 26 (H) 10/23/2012 0519   CREATININE 2.11 (H) 07/01/2019 0910   CREATININE 1.73 (H) 10/23/2012 0519   CALCIUM 9.1 07/01/2019 0910   CALCIUM 9.6 10/23/2012 0519   GFRNONAA 29 (L) 07/01/2019 0910   GFRNONAA 39 (L) 10/23/2012 0519   GFRAA 34 (L) 07/01/2019 0910   GFRAA 45 (L) 10/23/2012  0519   CrCl cannot be calculated ([name] most recent lab result is older than the maximum 21 days allowed.).  COAG Lab Results  Component Value Date   INR 1.1 09/04/2011   INR 1.0 09/02/2011    Radiology VAS [place] ABI WITH/WO TBI  Result Date: 10/13/2019 LOWER EXTREMITY DOPPLER STUDY Indications: Rest pain, and ulceration.  Performing Technologist: Charlane Ferretti RT (R)(VS)  Examination Guidelines: A complete evaluation includes at minimum, Doppler waveform signals and systolic blood pressure reading at the level of bilateral brachial, anterior tibial, and posterior tibial arteries, when vessel segments are accessible. Bilateral testing is considered an integral part of a complete examination. Photoelectric Plethysmograph (PPG) waveforms and toe systolic pressure readings are included as required and additional duplex testing as needed. Limited examinations for reoccurring indications may be performed as noted.  ABI Findings: +---------+------------------+-----+----------+--------+ Right    Rt Pressure (mmHg)IndexWaveform  Comment  +---------+------------------+-----+----------+--------+ Brachial 140                                       +---------+------------------+-----+----------+--------+ ATA      174               1.24 triphasic          +---------+------------------+-----+----------+--------+ PTA      159               1.14 monophasic         +---------+------------------+-----+----------+--------+ Great Toe189                1.35 Normal             +---------+------------------+-----+----------+--------+ +---------+------------------+-----+---------+-------+ Left     Lt Pressure (mmHg)IndexWaveform Comment +---------+------------------+-----+---------+-------+ Brachial 138                                     +---------+------------------+-----+---------+-------+ ATA      133               0.95 triphasic        +---------+------------------+-----+---------+-------+ PTA      162               1.16 triphasic        +---------+------------------+-----+---------+-------+ Dewitt Hoes               1.16 Normal           +---------+------------------+-----+---------+-------+ Summary: Right: Resting right ankle-brachial index is within normal range. No evidence of significant right lower extremity arterial disease. The right toe-brachial index is normal. Left: Resting left ankle-brachial index is within normal range. No evidence of significant left lower extremity arterial disease. The left toe-brachial index is normal.  *See table(s) above for measurements and observations.  Electronically signed by Hortencia Pilar MD on 10/13/2019 at 9:08:16 AM.   Final     Assessment/Plan 1. Chronic deep vein thrombosis (DVT) of proximal vein of lower extremity, unspecified laterality (HCC)  No surgery or intervention at this point in time.  However, the patient will be undergoing right total knee replacement in May.  Given his history of DVT and the need to be off anticoagulation he will require IVC filter.  The risks and benefits were reviewed with him all questions have been answered patient agrees to proceed.  He also understands and acknowledges that the plan will be for removal of  the IVC filter in approximately 30 days.  In the meantime, I have reviewed my discussion with the patient regarding lymphedema and why it  causes symptoms.  Patient will continue wearing graduated compression stockings  class 1 (20-30 mmHg) on a daily basis a prescription was given. The patient is reminded to put the stockings on first thing in the morning and removing them in the evening. The patient is instructed specifically not to sleep in the stockings.   In addition, behavioral modification throughout the day will be continued.  This will include frequent elevation (such as in a recliner), use of over the counter pain medications as needed and exercise such as walking.  I have reviewed systemic causes for chronic edema such as liver, kidney and cardiac etiologies and there does not appear to be any significant changes in these organ systems over the past year.  The patient is under the impression that these organ systems are all stable and unchanged.    The patient will continue aggressive use of the  lymph pump.  This will continue to improve the edema control and prevent sequela such as ulcers and infections.   The patient will follow-up with me on an annual basis.    2. Chronic venous insufficiency  No surgery or intervention at this point in time.    I have reviewed my discussion with the patient regarding lymphedema and why it  causes symptoms.  Patient will continue wearing graduated compression stockings class 1 (20-30 mmHg) on a daily basis a prescription was given. The patient is reminded to put the stockings on first thing in the morning and removing them in the evening. The patient is instructed specifically not to sleep in the stockings.   In addition, behavioral modification throughout the day will be continued.  This will include frequent elevation (such as in a recliner), use of over the counter pain medications as needed and exercise such as walking.  I have reviewed systemic causes for chronic edema such as liver, kidney and cardiac etiologies and there does not appear to be any significant changes in these organ systems over the past year.  The patient is under the impression that these  organ systems are all stable and unchanged.    The patient will continue aggressive use of the  lymph pump.  This will continue to improve the edema control and prevent sequela such as ulcers and infections.   The patient will follow-up with me on an annual basis.    3. PAD (peripheral artery disease) ([place]) I will check ABIs prior to his orthopedic surgery.  4. Primary osteoarthritis of right knee Plan for right TKR in May 2021    Hortencia Pilar, MD  10/13/2019 1:06 PM

## 2019-10-15 NOTE — Discharge Instructions (Signed)
Instructions after Total Knee Replacement   Debroah Shuttleworth P. Deara Bober, Jr., M.D.     Dept. of Orthopaedics & Sports Medicine  [facility]  [address]  [place], [place]  [address]  Phone: [phone]   Fax: [phone]    DIET: Drink plenty of non-alcoholic fluids. Resume your normal diet. Include foods high in fiber.  ACTIVITY:  You may use crutches or a walker with weight-bearing as tolerated, unless instructed otherwise. You may be weaned off of the walker or crutches by your Physical Therapist.  Do NOT place pillows under the knee. Anything placed under the knee could limit your ability to straighten the knee.   Continue doing gentle exercises. Exercising will reduce the pain and swelling, increase motion, and prevent muscle weakness.   Please continue to use the TED compression stockings for 6 weeks. You may remove the stockings at night, but should reapply them in the morning. Do not drive or operate any equipment until instructed.  WOUND CARE:  Continue to use the PolarCare or ice packs periodically to reduce pain and swelling. You may bathe or shower after the staples are removed at the first office visit following surgery.  MEDICATIONS: You may resume your regular medications. Please take the pain medication as prescribed on the medication. Do not take pain medication on an empty stomach. You have been given a prescription for a blood thinner (Lovenox or Coumadin). Please take the medication as instructed. (NOTE: After completing a 2 week course of Lovenox, take one Enteric-coated aspirin once a day. This along with elevation will help reduce the possibility of phlebitis in your operated leg.) Do not drive or drink alcoholic beverages when taking pain medications.  CALL THE OFFICE FOR: Temperature above 101 degrees Excessive bleeding or drainage on the dressing. Excessive swelling, coldness, or paleness of the toes. Persistent nausea and vomiting.  FOLLOW-UP:  You  should have an appointment to return to the office in 10-14 days after surgery. Arrangements have been made for continuation of Physical Therapy (either home therapy or outpatient therapy).   [facility] Department Directory         [email or web address]       [email or web address]          Cardiology  Appointments: [place] - [phone] [place] - [phone]  Endocrinology  Appointments: [place] - [phone] [place] - [phone]  Gastroenterology  Appointments: [place] - [phone] [place] - [phone]        General Surgery   Appointments: [place] - [phone]  Internal Medicine/Family Medicine  Appointments: [place] - [phone] [place] - [phone] [place] - [phone]  Metabolic and Weigh Loss Surgery  Appointments: [place] - [phone]        Neurology  Appointments: [place] - [phone] [place] - [phone]  Neurosurgery  Appointments: [place] - [phone]  Obstetrics & Gynecology  Appointments: [place] - [phone] [place] - [phone]        Pediatrics  Appointments: [place] - [phone] [place] - [phone]  Physiatry  Appointments: [place] -[phone]  Physical Therapy  Appointments: [place] - [phone] [place] - [phone]        Podiatry  Appointments: [place] - [phone] [place] - [phone]  Pulmonology  Appointments: [place] - [phone]  Rheumatology  Appointments: [place] - [phone]        [place] Location: [facility]  [address] [place], [place]  [address]  [place] Location: [facility] [address] [place], [place]  [address]  [place] Location: [facility] [address] [place],   [address]    

## 2019-10-20 ENCOUNTER — Encounter
Admission: RE | Admit: 2019-10-20 | Payer: Medicare Other | Source: Ambulatory Visit | Attending: Orthopedic Surgery | Admitting: Orthopedic Surgery

## 2019-10-21 ENCOUNTER — Other Ambulatory Visit
Admission: RE | Admit: 2019-10-21 | Discharge: 2019-10-21 | Disposition: A | Discharge status: Home / Self Care | Payer: Medicare Other | Source: Ambulatory Visit | Attending: Vascular Surgery | Admitting: Vascular Surgery

## 2019-10-21 DIAGNOSIS — Z01812 Encounter for preprocedural laboratory examination: Secondary | ICD-10-CM | POA: Diagnosis present

## 2019-10-21 DIAGNOSIS — Z20822 Contact with and (suspected) exposure to covid-19: Secondary | ICD-10-CM | POA: Insufficient documentation

## 2019-10-21 LAB — SARS CORONAVIRUS 2 (TAT 6-24 HRS): SARS Coronavirus 2: NEGATIVE

## 2019-10-24 ENCOUNTER — Other Ambulatory Visit (INDEPENDENT_AMBULATORY_CARE_PROVIDER_SITE_OTHER): Payer: Self-pay | Admitting: Nurse Practitioner

## 2019-10-25 ENCOUNTER — Other Ambulatory Visit: Payer: Self-pay

## 2019-10-25 ENCOUNTER — Ambulatory Visit
Admission: RE | Admit: 2019-10-25 | Discharge: 2019-10-25 | Disposition: A | Discharge status: Home / Self Care | Payer: Medicare Other | Source: Ambulatory Visit | Attending: Vascular Surgery | Admitting: Vascular Surgery

## 2019-10-25 ENCOUNTER — Encounter: Payer: Self-pay | Admitting: Vascular Surgery

## 2019-10-25 ENCOUNTER — Encounter
Admission: RE | Disposition: A | Discharge status: Home / Self Care | Payer: Self-pay | Source: Ambulatory Visit | Attending: Vascular Surgery

## 2019-10-25 DIAGNOSIS — M109 Gout, unspecified: Secondary | ICD-10-CM | POA: Diagnosis not present

## 2019-10-25 DIAGNOSIS — Z87891 Personal history of nicotine dependence: Secondary | ICD-10-CM | POA: Insufficient documentation

## 2019-10-25 DIAGNOSIS — G4730 Sleep apnea, unspecified: Secondary | ICD-10-CM | POA: Diagnosis not present

## 2019-10-25 DIAGNOSIS — E1122 Type 2 diabetes mellitus with diabetic chronic kidney disease: Secondary | ICD-10-CM | POA: Diagnosis not present

## 2019-10-25 DIAGNOSIS — I825Y9 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: Secondary | ICD-10-CM | POA: Diagnosis not present

## 2019-10-25 DIAGNOSIS — M1711 Unilateral primary osteoarthritis, right knee: Secondary | ICD-10-CM | POA: Diagnosis not present

## 2019-10-25 DIAGNOSIS — I825Y1 Chronic embolism and thrombosis of unspecified deep veins of right proximal lower extremity: Secondary | ICD-10-CM | POA: Diagnosis present

## 2019-10-25 DIAGNOSIS — N189 Chronic kidney disease, unspecified: Secondary | ICD-10-CM | POA: Diagnosis not present

## 2019-10-25 DIAGNOSIS — Z86718 Personal history of other venous thrombosis and embolism: Secondary | ICD-10-CM

## 2019-10-25 DIAGNOSIS — I890 Lymphedema, not elsewhere classified: Secondary | ICD-10-CM | POA: Diagnosis not present

## 2019-10-25 DIAGNOSIS — E1151 Type 2 diabetes mellitus with diabetic peripheral angiopathy without gangrene: Secondary | ICD-10-CM | POA: Diagnosis not present

## 2019-10-25 DIAGNOSIS — Z7901 Long term (current) use of anticoagulants: Secondary | ICD-10-CM | POA: Diagnosis not present

## 2019-10-25 DIAGNOSIS — Z79899 Other long term (current) drug therapy: Secondary | ICD-10-CM | POA: Insufficient documentation

## 2019-10-25 DIAGNOSIS — I82401 Acute embolism and thrombosis of unspecified deep veins of right lower extremity: Secondary | ICD-10-CM

## 2019-10-25 DIAGNOSIS — R569 Unspecified convulsions: Secondary | ICD-10-CM | POA: Insufficient documentation

## 2019-10-25 DIAGNOSIS — I872 Venous insufficiency (chronic) (peripheral): Secondary | ICD-10-CM

## 2019-10-25 DIAGNOSIS — I129 Hypertensive chronic kidney disease with stage 1 through stage 4 chronic kidney disease, or unspecified chronic kidney disease: Secondary | ICD-10-CM | POA: Insufficient documentation

## 2019-10-25 HISTORY — PX: IVC FILTER INSERTION: CATH118245

## 2019-10-25 LAB — CREATININE, SERUM
Creatinine, Ser: 2.51 mg/dL — ABNORMAL HIGH (ref 0.61–1.24)
GFR calc Af Amer: 28 mL/min — ABNORMAL LOW (ref >60)
GFR calc non Af Amer: 24 mL/min — ABNORMAL LOW (ref >60)

## 2019-10-25 LAB — BUN: BUN: 61 mg/dL — ABNORMAL HIGH (ref 8–23)

## 2019-10-25 SURGERY — IVC FILTER INSERTION
Anesthesia: Moderate Sedation

## 2019-10-25 MED ORDER — FENTANYL CITRATE (PF) 100 MCG/2ML IJ SOLN
INTRAMUSCULAR | Status: AC
  Filled 2019-10-25: qty 2

## 2019-10-25 MED ORDER — METHYLPREDNISOLONE SODIUM SUCC 125 MG IJ SOLR
INTRAMUSCULAR | Status: AC
  Administered 2019-10-25: 125 mg via INTRAVENOUS
  Filled 2019-10-25: qty 2

## 2019-10-25 MED ORDER — ONDANSETRON HCL 4 MG/2ML IJ SOLN: 4.0000 mg | Freq: Four times a day (QID) | INTRAMUSCULAR | Status: DC | PRN

## 2019-10-25 MED ORDER — MIDAZOLAM HCL 2 MG/ML PO SYRP: 8.0000 mg | ORAL_SOLUTION | Freq: Once | ORAL | Status: DC | PRN

## 2019-10-25 MED ORDER — SODIUM CHLORIDE 0.9 % IV SOLN
INTRAVENOUS | Status: DC
  Administered 2019-10-25: 1000 mL via INTRAVENOUS

## 2019-10-25 MED ORDER — CEFAZOLIN SODIUM-DEXTROSE 2-4 GM/100ML-% IV SOLN
INTRAVENOUS | Status: AC
  Filled 2019-10-25: qty 100

## 2019-10-25 MED ORDER — HYDROMORPHONE HCL 1 MG/ML IJ SOLN: 1.0000 mg | Freq: Once | INTRAMUSCULAR | Status: DC | PRN

## 2019-10-25 MED ORDER — IODIXANOL 320 MG/ML IV SOLN
INTRAVENOUS | Status: DC | PRN
  Administered 2019-10-25: 15 mL via INTRAVENOUS

## 2019-10-25 MED ORDER — METHYLPREDNISOLONE SODIUM SUCC 125 MG IJ SOLR: 125.0000 mg | Freq: Once | INTRAMUSCULAR | Status: AC | PRN

## 2019-10-25 MED ORDER — MIDAZOLAM HCL 5 MG/5ML IJ SOLN
INTRAMUSCULAR | Status: AC
  Filled 2019-10-25: qty 5

## 2019-10-25 MED ORDER — CEFAZOLIN SODIUM-DEXTROSE 2-4 GM/100ML-% IV SOLN
2.0000 g | Freq: Once | INTRAVENOUS | Status: AC
  Administered 2019-10-25: 2 g via INTRAVENOUS

## 2019-10-25 MED ORDER — DIPHENHYDRAMINE HCL 50 MG/ML IJ SOLN: 50.0000 mg | Freq: Once | INTRAMUSCULAR | Status: AC | PRN

## 2019-10-25 MED ORDER — MIDAZOLAM HCL 2 MG/2ML IJ SOLN
INTRAMUSCULAR | Status: DC | PRN
  Administered 2019-10-25: 1 mg via INTRAVENOUS

## 2019-10-25 MED ORDER — FAMOTIDINE 20 MG PO TABS: 40.0000 mg | ORAL_TABLET | Freq: Once | ORAL | Status: AC | PRN

## 2019-10-25 MED ORDER — FENTANYL CITRATE (PF) 100 MCG/2ML IJ SOLN
INTRAMUSCULAR | Status: DC | PRN
  Administered 2019-10-25: 25 ug via INTRAVENOUS

## 2019-10-25 MED ORDER — DIPHENHYDRAMINE HCL 50 MG/ML IJ SOLN
INTRAMUSCULAR | Status: AC
  Administered 2019-10-25: 25 mg via INTRAVENOUS
  Filled 2019-10-25: qty 1

## 2019-10-25 MED ORDER — FAMOTIDINE 20 MG PO TABS
ORAL_TABLET | ORAL | Status: AC
  Administered 2019-10-25: 40 mg via ORAL
  Filled 2019-10-25: qty 2

## 2019-10-25 SURGICAL SUPPLY — 5 items
KIT FEMORAL DEL DENALI (Miscellaneous) ×2 IMPLANT
NEEDLE ENTRY 21GA 7CM ECHOTIP (NEEDLE) ×2 IMPLANT
PACK ANGIOGRAPHY (CUSTOM PROCEDURE TRAY) ×2 IMPLANT
SET INTRO CAPELLA COAXIAL (SET/KITS/TRAYS/PACK) ×2 IMPLANT
WIRE J 3MM .035X145CM (WIRE) ×2 IMPLANT

## 2019-10-25 NOTE — Discharge Instructions (Signed)
Inferior Vena Cava Filter Insertion, Care After This sheet gives you information about how to care for yourself after your procedure. Your health care provider may also give you more specific instructions. If you have problems or questions, contact your health care provider. What can I expect after the procedure? After your procedure, it is common to have:  Mild pain in the area where the filter was inserted.  Mild bruising in the area where the filter was inserted. Follow these instructions at home: Insertion site care   Follow instructions from your health care provider about how to take care of the site where a catheter was inserted at your neck or groin (insertion site). Make sure you: ? Wash your hands with soap and water before you change your bandage (dressing). If soap and water are not available, use hand sanitizer. ? Change your dressing as told by your health care provider.  Check your insertion site every day for signs of infection. Check for: ? More redness, swelling, or pain. ? More fluid or blood. ? Warmth. ? Pus or a bad smell.  Keep the insertion site clean and dry.  Do not shower, bathe, use a hot tub, or let the dressing get wet until your health care provider approves. General instructions  Take over-the-counter and prescription medicines only as told by your health care provider.  Avoid heavy lifting or hard activities for 48 hours after the procedure or as told by your health care provider.  Do not drive for 24 hours if you were given a medicine to help you relax (sedative).  Do not drive or use heavy machinery while taking prescription pain medicine.  Do not go back to school or work until your health care provider approves.  Keep all follow-up visits as told by your health care provider. This is important. Contact a health care provider if:  You have more redness, swelling, or pain around your insertion site.  You have more fluid or blood coming from  your insertion site.  Your insertion site feels warm to the touch.  You have pus or a bad smell coming from your insertion site.  You have a fever.  You are dizzy.  You have nausea and vomiting.  You develop a rash. Get help right away if:  You develop chest pain, a cough, or difficulty breathing.  You develop shortness of breath, feel faint, or pass out.  You cough up blood.  You have severe pain in your abdomen.  You develop swelling and discoloration or pain in your legs.  Your legs become pale and cold or blue.  You develop weakness, difficulty moving your arms or legs, or balance problems.  You develop problems with speech or vision. These symptoms may represent a serious problem that is an emergency. Do not wait to see if the symptoms will go away. Get medical help right away. Call your local emergency services (911 in the [place] Do not drive yourself to the hospital. Summary  After your insertion procedure, it is common to have mild pain and bruising.  Do not shower, bathe, use a hot tub, or let the dressing get wet until your health care provider approves.  Every day, check for signs of infection where a catheter was inserted at your neck or groin (insertion site). This information is not intended to replace advice given to you by your health care provider. Make sure you discuss any questions you have with your health care provider. Document Revised: 05/22/2017 Document Reviewed:   04/30/2016 Elsevier Patient Education  2020 [employer] Inc[employer]  

## 2019-10-25 NOTE — Op Note (Signed)
[facility]   OPERATIVE NOTE    PRE-OPERATIVE DIAGNOSIS: DVT with PE  POST-OPERATIVE DIAGNOSIS: Same  PROCEDURE: 1.   Ultrasound guidance for vascular access to the right common femoral vein 2.   Catheter placement into the inferior vena cava 3.   Inferior venacavogram 4.   Placement of a Denali IVC filter  SURGEON: Hortencia Pilar  ASSISTANT(S): None  ANESTHESIA: Conscious sedation was administered by the interventional radiology RN under my direct supervision. IV Versed plus fentanyl were utilized. Continuous ECG, pulse oximetry and blood pressure was monitored throughout the entire procedure. Conscious sedation was for a total of 20 minutes.  ESTIMATED BLOOD LOSS: minimal  FINDING(S): 1.  Patent IVC  SPECIMEN(S):  none  INDICATIONS:   Justin Shannon. is a 79 y.o. y.o. 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  Inferior vena cava filter is indicated for this reason.  Risks and benefits including filter thrombosis, migration, fracture, bleeding, and infection were all discussed.  We discussed that all IVC filters that we place can be removed if desired from the patient once the need for the filter has passed.    DESCRIPTION: After obtaining full informed written consent, the patient was brought back to the vascular suite. The skin was sterilely prepped and draped in a sterile surgical field was created. Ultrasound was placed in a sterile sleeve. The right common femoral vein was echolucent and compressible indicating patency. Image was recorded for the permanent record. The puncture was made under continuous real-time ultrasound guidance.  The right common femoral vein was accessed under direct ultrasound guidance without difficulty with a micropuncture needle. Microwire was then advanced under fluoroscopic guidance without difficulty. Micro-sheath was then inserted and a J-wire was then placed. The dilator is passed  over the wire and the delivery sheath was placed into the inferior vena cava.  Inferior venacavogram was performed. This demonstrated a patent IVC with the level of the renal veins at L2.  The filter was then deployed into the inferior vena cava at the level of L3 just below the renal veins. The delivery sheath was then removed. Pressure was held. Sterile dressings were placed. The patient tolerated the procedure well and was taken to the recovery room in stable condition.  Interpretation: Inferior vena cava is widely patent.  Renal blushes are noted at L2.  Denali filter is deployed in good orientation.  COMPLICATIONS: None  CONDITION: Stable  Hortencia Pilar  10/25/2019, 8:50 AM

## 2019-10-25 NOTE — H&P (Signed)
[facility] History & Physical Update  The patient was interviewed and re-examined.  The patient's previous History and Physical has been reviewed and is unchanged.  There is no change in the plan of care. We plan to proceed with the scheduled procedure.  Hortencia Pilar, MD  10/25/2019, 8:03 AM

## 2019-10-26 ENCOUNTER — Other Ambulatory Visit: Payer: Self-pay

## 2019-10-26 ENCOUNTER — Encounter
Admission: RE | Admit: 2019-10-26 | Discharge: 2019-10-26 | Disposition: A | Discharge status: Home / Self Care | Payer: Medicare Other | Source: Ambulatory Visit | Attending: Orthopedic Surgery | Admitting: Orthopedic Surgery

## 2019-10-26 NOTE — Patient Instructions (Signed)
COVID TESTING Date: 10/31/19 Testing site:  [facility] [address] Thru Hours:  7:61 am - 1:00 pm Once you are tested, you are asked to stay quarantined (avoiding public places) until after your surgery.   Your procedure is scheduled on: Nov 02, 2019 MONDAY Report to Day Surgery on the 2nd floor of the [facility]. To find out your arrival time, please call [phone] between 1PM - 3PM on: Nov 01, 2019 TUESDAY  REMEMBER: Instructions that are not followed completely may result in serious medical risk, up to and including death; or upon the discretion of your surgeon and anesthesiologist your surgery may need to be rescheduled.  Do not eat food after midnight the night before surgery.  No gum chewing, lozengers or hard candies.  You may however, drink CLEAR liquids up to 2 hours before you are scheduled to arrive for your surgery. Do not drink anything within 2 hours of your scheduled arrival time.  Clear liquids include: - water   Do NOT drink anything that is not on this list.  Type 1 and Type 2 diabetics should only drink water.  GATORADE DRINK:  Complete drinking 2 hours prior to scheduled arrival time.  TAKE THESE MEDICATIONS THE MORNING OF SURGERY WITH A SIP OF WATER: DILTIAZEM KEPPRO METOPROLOL ALLOPURINAL ALLEGRA  DO NOT TAKE TORSEMIDE DAY OF SURGERY  USE FLONASE DAY OF SURGERY   Follow recommendations from Cardiologist, Pulmonologist or PCP regarding stopping XERALTO.  Stop Anti-inflammatories (NSAIDS) such as Advil, Aleve, Ibuprofen, Motrin, Naproxen, Naprosyn and Aspirin based products such as Excedrin, Goodys Powder, BC Powder. (May take Tylenol or Acetaminophen if needed.)  Stop ANY OVER THE COUNTER supplements until after surgery. (May continue Vitamin D, Vitamin B, and multivitamin.)  No Alcohol for 24 hours before or after surgery.  No Smoking including e-cigarettes for 24 hours prior to surgery.  No chewable  tobacco products for at least 6 hours prior to surgery.  No nicotine patches on the day of surgery.  Do not use any "recreational" drugs for at least a week prior to your surgery.  Please be advised that the combination of cocaine and anesthesia may have negative outcomes, up to and including death. If you test positive for cocaine, your surgery will be cancelled.  On the morning of surgery brush your teeth with toothpaste and water, you may rinse your mouth with mouthwash if you wish. Do not swallow any toothpaste or mouthwash.  Do not wear jewelry, make-up, hairpins, clips or nail polish.  Do not wear lotions, powders, or perfumes.   Do not shave 48 hours prior to surgery.   Contact lenses, hearing aids and dentures may not be worn into surgery.  Do not bring valuables to the hospital. [facility] is not responsible for any missing/lost belongings or valuables.   Use CHG Soap or wipes as directed on instruction sheet.   Bring your C-PAP to the hospital with you in case you may have to spend the night.   Notify your doctor if there is any change in your medical condition (cold, fever, infection).  Wear comfortable clothing (specific to your surgery type) to the hospital.  Plan for stool softeners for home use; pain medications have a tendency to cause constipation. You can also help prevent constipation by eating foods high in fiber such as fruits and vegetables and drinking plenty of fluids as your diet allows.  After surgery, you can help prevent lung complications by doing breathing exercises.  Take deep breaths and cough every 1-2 hours. Your doctor may order a device called an Incentive Spirometer to help you take deep breaths. When coughing or sneezing, hold a pillow firmly against your incision with both hands. This is called "splinting." Doing this helps protect your incision. It also decreases belly discomfort.  If you are being admitted to the hospital overnight, leave  your suitcase in the car. After surgery it may be brought to your room.  If you are taking public transportation, you will need to have a responsible adult (18 years or older) with you. Please confirm with your physician that it is acceptable to use public transportation.   Please call the [place] [facility] at [phone] if you have any questions about these instructions.  Visitation Policy:  Patients undergoing a surgery or procedure may have one family member or support person with them as long as that person is not COVID-19 positive or experiencing its symptoms.  That person may remain in the waiting area during the procedure.  Children under 63 years of age may have both parents or legal guardians with them during their hospital stay.   Inpatient Visitation Update:  Two designated support people may visit a patient during visiting hours 7 am to 8 pm. It must be the same two designated people for the duration of the patient stay. The visitors may come and go during the day, and there is no switching out to have different visitors. A mask must be worn at all times, including in the patient room.

## 2019-10-28 ENCOUNTER — Encounter
Admission: RE | Admit: 2019-10-28 | Discharge: 2019-10-28 | Disposition: A | Discharge status: Home / Self Care | Payer: Medicare Other | Source: Ambulatory Visit | Attending: Orthopedic Surgery | Admitting: Orthopedic Surgery

## 2019-10-28 ENCOUNTER — Other Ambulatory Visit: Payer: Self-pay

## 2019-10-28 DIAGNOSIS — Z01818 Encounter for other preprocedural examination: Secondary | ICD-10-CM | POA: Diagnosis present

## 2019-10-28 DIAGNOSIS — I498 Other specified cardiac arrhythmias: Secondary | ICD-10-CM | POA: Insufficient documentation

## 2019-10-28 LAB — TYPE AND SCREEN
ABO/RH(D): B POS
Antibody Screen: NEGATIVE

## 2019-10-28 LAB — COMPREHENSIVE METABOLIC PANEL
ALT: 16 U/L (ref 0–44)
AST: 27 U/L (ref 15–41)
Albumin: 4.1 g/dL (ref 3.5–5.0)
Alkaline Phosphatase: 70 U/L (ref 38–126)
Anion gap: 8 (ref 5–15)
BUN: 63 mg/dL — ABNORMAL HIGH (ref 8–23)
CO2: 27 mmol/L (ref 22–32)
Calcium: 9.6 mg/dL (ref 8.9–10.3)
Chloride: 106 mmol/L (ref 98–111)
Creatinine, Ser: 2.18 mg/dL — ABNORMAL HIGH (ref 0.61–1.24)
GFR calc Af Amer: 33 mL/min — ABNORMAL LOW (ref >60)
GFR calc non Af Amer: 28 mL/min — ABNORMAL LOW (ref >60)
Glucose, Bld: 90 mg/dL (ref 70–99)
Potassium: 3.9 mmol/L (ref 3.5–5.1)
Sodium: 141 mmol/L (ref 135–145)
Total Bilirubin: 0.5 mg/dL (ref 0.3–1.2)
Total Protein: 7.8 g/dL (ref 6.5–8.1)

## 2019-10-28 LAB — URINALYSIS, ROUTINE W REFLEX MICROSCOPIC
Bilirubin Urine: NEGATIVE
Glucose, UA: NEGATIVE mg/dL
Hgb urine dipstick: NEGATIVE
Ketones, ur: NEGATIVE mg/dL
Leukocytes,Ua: NEGATIVE
Nitrite: NEGATIVE
Protein, ur: NEGATIVE mg/dL
Specific Gravity, Urine: 1.005 (ref 1.005–1.030)
pH: 5 (ref 5.0–8.0)

## 2019-10-28 LAB — CBC
HCT: 29.6 % — ABNORMAL LOW (ref 39.0–52.0)
Hemoglobin: 9.7 g/dL — ABNORMAL LOW (ref 13.0–17.0)
MCH: 23.2 pg — ABNORMAL LOW (ref 26.0–34.0)
MCHC: 32.8 g/dL (ref 30.0–36.0)
MCV: 70.8 fL — ABNORMAL LOW (ref 80.0–100.0)
Platelets: 167 10*3/uL (ref 150–400)
RBC: 4.18 MIL/uL — ABNORMAL LOW (ref 4.22–5.81)
RDW: 19.7 % — ABNORMAL HIGH (ref 11.5–15.5)
WBC: 7.4 10*3/uL (ref 4.0–10.5)
nRBC: 0 % (ref 0.0–0.2)

## 2019-10-28 LAB — PROTIME-INR
INR: 1 (ref 0.8–1.2)
Prothrombin Time: 12.8 seconds (ref 11.4–15.2)

## 2019-10-28 LAB — HEMOGLOBIN A1C
Hgb A1c MFr Bld: 6 % — ABNORMAL HIGH (ref 4.8–5.6)
Mean Plasma Glucose: 125.5 mg/dL

## 2019-10-28 LAB — SEDIMENTATION RATE: Sed Rate: 41 mm/hr — ABNORMAL HIGH (ref 0–20)

## 2019-10-28 LAB — SURGICAL PCR SCREEN
MRSA, PCR: NEGATIVE
Staphylococcus aureus: NEGATIVE

## 2019-10-28 LAB — C-REACTIVE PROTEIN: CRP: 0.6 mg/dL (ref <1.0)

## 2019-10-28 LAB — APTT: aPTT: 31 seconds (ref 24–36)

## 2019-10-29 LAB — URINE CULTURE
Culture: NO GROWTH
Special Requests: NORMAL

## 2019-10-31 ENCOUNTER — Other Ambulatory Visit
Admission: RE | Admit: 2019-10-31 | Discharge: 2019-10-31 | Disposition: A | Discharge status: Home / Self Care | Payer: Medicare Other | Source: Ambulatory Visit | Attending: Orthopedic Surgery | Admitting: Orthopedic Surgery

## 2019-10-31 DIAGNOSIS — Z01812 Encounter for preprocedural laboratory examination: Secondary | ICD-10-CM | POA: Insufficient documentation

## 2019-10-31 DIAGNOSIS — Z20822 Contact with and (suspected) exposure to covid-19: Secondary | ICD-10-CM | POA: Insufficient documentation

## 2019-10-31 LAB — SARS CORONAVIRUS 2 (TAT 6-24 HRS): SARS Coronavirus 2: NEGATIVE

## 2019-11-01 ENCOUNTER — Encounter: Payer: Self-pay | Admitting: Orthopedic Surgery

## 2019-11-01 NOTE — H&P (Signed)
ORTHOPAEDIC HISTORY & PHYSICAL Progress Notes Gwenlyn Fudge, [place] - 10/21/2019 3:15 PM EDT [facility] Chief Complaint:   Chief Complaint  Patient presents with  . Knee Pain  H & P RIGHT KNEE   History of Present Illness:   Justin Shannon is a 79 y.o. male that presents to clinic today for his preoperative history and evaluation. Patient presents with his daughter. The patient is scheduled to undergo a right total knee arthroplasty on 11/02/19 by Dr. Marry Guan. His pain began around 3 years ago. The pain is located in the medial and lateral aspects of the knee. He describes his pain as worse with any weightbearing, standing, or walking. He reports associated swelling with significant giving way of the knee. He denies associated numbness or tingling, locking of the knee.   The patient's symptoms have progressed to the point that they decrease his quality of life. The patient has previously undergone conservative treatment including NSAIDS and injections to the knee without adequate control of his symptoms.  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 He has received serial Unna boot dressings to the right lower extremity with apparent healing of the ulceration. He does have a history of a right lower extremity deep venous thrombosis.  Per patient's daughter, the ulcer is healed at this point and they have received clearance from both nephrology and vascular. Denies history of lumbar surgery or significant cardiac history.  Past Medical, Surgical, Family, Social History, Allergies, Medications:   Past Medical History:  Past Medical History:  Diagnosis Date  . Allergic rhinitis  . 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 Cataract cortical, senile docter said small cataracts  . Chicken pox  . Chronic kidney disease 6 years ago  . DVT  (deep venous thrombosis) (CMS-HCC) 2013  . Gout  . Hypertension  . OSA (obstructive sleep apnea) 01/10/2014  . Seizures (CMS-HCC)   Past Surgical History:  Past Surgical History:  Procedure Laterality Date  . APPENDECTOMY 2001  . HERNIA REPAIR 2000  . Prostate surgery 2013  . Throat mass removed   Current Medications:  Current Outpatient Medications  Medication Sig Dispense Refill  . ACCU-CHEK GUIDE ME GLUCOSE MTR Misc 2 (two) times daily  . allopurinoL (ZYLOPRIM) 100 MG tablet Take 1 tablet (100 mg total) by mouth once daily 30 tablet 1  . ascorbic acid, vitamin C, (VITAMIN C) 500 MG tablet Take 1 tablet by mouth once daily  . aspirin-calcium carbonate 81 mg-300 mg calcium(777 mg) Tab Take 1 tablet by mouth once daily  . blood glucose diagnostic test strip Use 1 each (1 strip total) 2 (two) times daily Use as instructed. 100 each 6  . diltiazem (CARDIZEM CD) 120 MG XR capsule Take 1 capsule (120 mg total) by mouth 2 (two) times daily 180 capsule 1  . docusate (COLACE) 100 MG capsule Take 200 mg by mouth 2 (two) times daily as needed  . fexofenadine (ALLEGRA ALLERGY) 180 MG tablet Take 180 mg by mouth once daily as needed  . fluticasone (FLONASE) 50 mcg/actuation nasal spray Place 2 sprays into both nostrils once daily. (Patient taking differently: Place 2 sprays into both nostrils once daily as needed ) 16 g 3  . gabapentin (NEURONTIN) 100 MG capsule Take 1 capsule (100 mg total) by mouth 3 (three) times daily 90 capsule 11  . lancets Use  1 each 2 (two) times daily Use as instructed. 100 each 12  . levETIRAcetam (KEPPRA) 500 MG tablet TAKE 1 TABLET BY MOUTH TWICE DAILY 180 tablet 3  . metoprolol succinate (TOPROL-XL) 50 MG XL tablet TAKE 1 TABLET BY MOUTH ONCE DAILY 90 tablet 3  . multivitamin tablet Take 1 tablet by mouth once daily.  Marland Kitchen olmesartan-hydrochlorothiazide (BENICAR HCT) 40-12.5 mg tablet Take 1 tablet by mouth once daily  . tamsulosin (FLOMAX) 0.4 mg capsule TAKE 1 CAPSULE  BY MOUTH ONCE DAILY ONE HALF HOUR FOLLOWING THE SAME MEAL [facility] DAY External Pharmacy 05/18/2019  . TORsemide (DEMADEX) 20 MG tablet Take 20 mg by mouth once daily  . traMADoL (ULTRAM) 50 mg tablet Take 1 tablet by mouth every 6 (six) hours as needed  . triamcinolone 0.1 % cream Apply topically 2 (two) times daily as needed 30 g 0  . XARELTO 20 mg tablet Take 1 tablet by mouth once daily with breakfast 90 tablet 1   No current facility-administered medications for this visit.   Allergies:  Allergies  Allergen Reactions  . Shellfish Containing Products Anaphylaxis  . Iodinated Contrast Media Kidney Disorder  Chronic kidney disease 3b  . Nsaids (Non-Steroidal Anti-Inflammatory Drug) Kidney Disorder  Chronic kidney disease 3b   Social History:  Social History   Socioeconomic History  . Marital status: Married  Spouse name: Zigmund Daniel  . Number of children: 3  . Years of education: 68  . Highest education level: Not on file  Occupational History  . Occupation: Retired  Tobacco Use  . Smoking status: Former Smoker  Packs/day: 0.00  Years: 0.00  Pack years: 0.00  Quit date: 06/23/1981  Years since quitting: 38.3  . Smokeless tobacco: Never Used  Vaping Use  . Vaping Use: Never assessed  Substance and Sexual Activity  . Alcohol use: Not Currently  Alcohol/week: 0.0 standard drinks  Comment: stopped 1983  . Drug use: No  . Sexual activity: Not Currently  Partners: Female  Other Topics Concern  . Not on file  Social History Narrative  . Not on file   Social Determinants of Health   Financial Resource Strain:  . Difficulty of Paying Living Expenses:  Food Insecurity:  . Worried About Charity fundraiser in the Last Year:  . Arboriculturist in the Last Year:  Transportation Needs:  . Film/video editor (Medical):  Marland Kitchen Lack of Transportation (Non-Medical):  Physical Activity:  . Days of Exercise per Week:  . Minutes of Exercise per Session:  Stress:  . Feeling of Stress :   Social Connections:  . Frequency of Communication with Friends and Family:  . Frequency of Social Gatherings with Friends and Family:  . Attends Religious Services:  . Active Member of Clubs or Organizations:  . Attends Archivist Meetings:  Marland Kitchen Marital Status:   Family History:  Family History  Problem Relation Age of Onset  . High blood pressure (Hypertension) Mother  . No Known Problems Father  . High blood pressure (Hypertension) Other  . Obesity Brother   Review of Systems:   A 10+ ROS was performed, reviewed, and the pertinent orthopaedic findings are documented in the HPI.   Physical Examination:   BP 140/70  Ht 177.8 cm (5\' 10" )  Wt (!) 125.5 kg (276 lb 9.6 oz)  BMI 39.69 kg/m   Patient is a well-developed, well-nourished male in no acute distress. Patient has normal mood and affect. Patient is alert and oriented to person,  place, and time.   HEENT: Atraumatic, normocephalic. Pupils equal and reactive to light. Extraocular motion intact. Noninjected sclera.  Cardiovascular: Regular rate and rhythm, with no murmurs, rubs, or gallops. Distal pulses unable to be palpated due to presence of compression wrapping. Capillary refill approximately 4 seconds.  Respiratory: Lungs clear to auscultation bilaterally.   Right Knee: Soft tissue swelling: moderate Effusion: none Erythema: none Crepitance: mild Tenderness: medial, lateral Alignment: relative varus Mediolateral laxity: stable Posterior sag: negative Patellar tracking: Good tracking without evidence of subluxation or tilt Atrophy: No significant atrophy.  Quadriceps tone was fair to good. Range of motion: 0/6/93 degrees  Sensation intact over the saphenous, lateral sural cutaneous, superficial fibular, and deep fibular nerve distributions.  Tests Performed/Reviewed:  X-rays  Anteroposterior, lateral, and sunrise views of the right knee were obtained. Images reveal complete loss of lateral  compartment joint space with severe loss of medial compartment joint space and osteophyte formation noted. Patellofemoral joint reveals complete loss of joint space with osteophyte formation. No fractures or dislocations noted.  Impression:   ICD-10-CM  1. Primary osteoarthritis of right knee M17.11  2. Type 2 diabetes mellitus with stage 4 chronic kidney disease, without long-term current use of insulin (CMS-HCC) E11.22  N18.4  3. Lymphedema of right lower extremity I89.0   Plan:   The patient has end-stage degenerative changes of the right knee. It was explained to the patient that the condition is progressive in nature. Having failed conservative treatment, the patient has elected to proceed with a total joint arthroplasty. The patient will undergo a total joint arthroplasty with Dr. Marry Guan. The risks of surgery, including blood clot and infection, were discussed with the patient. Measures to reduce these risks, including the use of anticoagulation, perioperative antibiotics, and early ambulation were discussed. The importance of postoperative physical therapy was discussed with the patient. The patient elects to proceed with surgery. Patient will stop use of blood thinners prior to placement of IVC filter. The patient is instructed to call the hospital the day before surgery to learn of the proper arrival time.   Contact our office with any questions or concerns. Follow up as indicated, or sooner should any new problems arise, if conditions worsen, or if they are otherwise concerned.   Gwenlyn Fudge, PA-C [facility] [address], [place] [address] Phone: [phone]  This note was generated in part with voice recognition software and I apologize for any typographical errors that were not detected and corrected.   Electronically signed by Gwenlyn Fudge, PA at 10/23/2019 9:37 PM EDT

## 2019-11-02 ENCOUNTER — Other Ambulatory Visit: Payer: Self-pay

## 2019-11-02 ENCOUNTER — Encounter: Payer: Self-pay | Admitting: Orthopedic Surgery

## 2019-11-02 ENCOUNTER — Encounter
Admission: RE | Disposition: A | Discharge status: Skilled Nursing Facility | Payer: Self-pay | Source: Home / Self Care | Attending: Orthopedic Surgery

## 2019-11-02 ENCOUNTER — Inpatient Hospital Stay: Payer: Medicare Other | Admitting: Registered Nurse

## 2019-11-02 ENCOUNTER — Inpatient Hospital Stay: Payer: Medicare Other

## 2019-11-02 ENCOUNTER — Inpatient Hospital Stay
Admission: RE | Admit: 2019-11-02 | Discharge: 2019-11-07 | DRG: 470 | Disposition: A | Discharge status: Skilled Nursing Facility | Payer: Medicare Other | Attending: Orthopedic Surgery | Admitting: Orthopedic Surgery

## 2019-11-02 DIAGNOSIS — M1711 Unilateral primary osteoarthritis, right knee: Principal | ICD-10-CM | POA: Diagnosis present

## 2019-11-02 DIAGNOSIS — Z8616 Personal history of COVID-19: Secondary | ICD-10-CM | POA: Diagnosis not present

## 2019-11-02 DIAGNOSIS — D631 Anemia in chronic kidney disease: Secondary | ICD-10-CM | POA: Diagnosis present

## 2019-11-02 DIAGNOSIS — E875 Hyperkalemia: Secondary | ICD-10-CM | POA: Diagnosis present

## 2019-11-02 DIAGNOSIS — Z79899 Other long term (current) drug therapy: Secondary | ICD-10-CM | POA: Diagnosis not present

## 2019-11-02 DIAGNOSIS — Z8349 Family history of other endocrine, nutritional and metabolic diseases: Secondary | ICD-10-CM

## 2019-11-02 DIAGNOSIS — Z96659 Presence of unspecified artificial knee joint: Secondary | ICD-10-CM

## 2019-11-02 DIAGNOSIS — E1122 Type 2 diabetes mellitus with diabetic chronic kidney disease: Secondary | ICD-10-CM | POA: Diagnosis present

## 2019-11-02 DIAGNOSIS — R0602 Shortness of breath: Secondary | ICD-10-CM

## 2019-11-02 DIAGNOSIS — Z886 Allergy status to analgesic agent status: Secondary | ICD-10-CM

## 2019-11-02 DIAGNOSIS — I129 Hypertensive chronic kidney disease with stage 1 through stage 4 chronic kidney disease, or unspecified chronic kidney disease: Secondary | ICD-10-CM | POA: Diagnosis present

## 2019-11-02 DIAGNOSIS — G40909 Epilepsy, unspecified, not intractable, without status epilepticus: Secondary | ICD-10-CM | POA: Diagnosis present

## 2019-11-02 DIAGNOSIS — Z7901 Long term (current) use of anticoagulants: Secondary | ICD-10-CM

## 2019-11-02 DIAGNOSIS — G4733 Obstructive sleep apnea (adult) (pediatric): Secondary | ICD-10-CM | POA: Diagnosis present

## 2019-11-02 DIAGNOSIS — Z87891 Personal history of nicotine dependence: Secondary | ICD-10-CM

## 2019-11-02 DIAGNOSIS — Z86718 Personal history of other venous thrombosis and embolism: Secondary | ICD-10-CM | POA: Diagnosis not present

## 2019-11-02 DIAGNOSIS — E1151 Type 2 diabetes mellitus with diabetic peripheral angiopathy without gangrene: Secondary | ICD-10-CM | POA: Diagnosis present

## 2019-11-02 DIAGNOSIS — E1136 Type 2 diabetes mellitus with diabetic cataract: Secondary | ICD-10-CM | POA: Diagnosis present

## 2019-11-02 DIAGNOSIS — Z8249 Family history of ischemic heart disease and other diseases of the circulatory system: Secondary | ICD-10-CM | POA: Diagnosis not present

## 2019-11-02 HISTORY — PX: KNEE ARTHROPLASTY: SHX992

## 2019-11-02 HISTORY — PX: APPLICATION OF WOUND VAC: SHX5189

## 2019-11-02 LAB — GLUCOSE, CAPILLARY
Glucose-Capillary: 83 mg/dL (ref 70–99)
Glucose-Capillary: 129 mg/dL — ABNORMAL HIGH (ref 70–99)
Glucose-Capillary: 151 mg/dL — ABNORMAL HIGH (ref 70–99)

## 2019-11-02 LAB — ABO/RH: ABO/RH(D): B POS

## 2019-11-02 SURGERY — ARTHROPLASTY, KNEE, TOTAL, USING IMAGELESS COMPUTER-ASSISTED NAVIGATION
Anesthesia: Spinal | Site: Knee | Laterality: Right

## 2019-11-02 MED ORDER — SODIUM CHLORIDE 0.9 % IV SOLN: INTRAVENOUS | Status: DC

## 2019-11-02 MED ORDER — FAMOTIDINE 20 MG PO TABS: 20.0000 mg | ORAL_TABLET | Freq: Once | ORAL | Status: AC

## 2019-11-02 MED ORDER — CEFAZOLIN SODIUM-DEXTROSE 2-4 GM/100ML-% IV SOLN
2.0000 g | Freq: Four times a day (QID) | INTRAVENOUS | Status: AC
  Administered 2019-11-02 – 2019-11-03 (×4): 2 g via INTRAVENOUS
  Filled 2019-11-02 (×4): qty 100

## 2019-11-02 MED ORDER — ALLOPURINOL 100 MG PO TABS
100.0000 mg | ORAL_TABLET | Freq: Every day | ORAL | Status: DC
  Administered 2019-11-03 – 2019-11-07 (×5): 100 mg via ORAL
  Filled 2019-11-02 (×5): qty 1

## 2019-11-02 MED ORDER — PROPOFOL 10 MG/ML IV BOLUS
INTRAVENOUS | Status: AC
  Filled 2019-11-02: qty 20

## 2019-11-02 MED ORDER — TRANEXAMIC ACID-NACL 1000-0.7 MG/100ML-% IV SOLN
INTRAVENOUS | Status: AC
  Filled 2019-11-02: qty 100

## 2019-11-02 MED ORDER — HYDROMORPHONE HCL 1 MG/ML IJ SOLN: 0.5000 mg | INTRAMUSCULAR | Status: DC | PRN

## 2019-11-02 MED ORDER — LIDOCAINE HCL (CARDIAC) PF 100 MG/5ML IV SOSY
PREFILLED_SYRINGE | INTRAVENOUS | Status: DC | PRN
  Administered 2019-11-02: 60 mg via INTRAVENOUS

## 2019-11-02 MED ORDER — KETAMINE HCL 10 MG/ML IJ SOLN
INTRAMUSCULAR | Status: DC | PRN
Start: 2019-11-02 — End: 2019-11-02
  Administered 2019-11-02 (×2): 10 mg via INTRAVENOUS
  Administered 2019-11-02: 20 mg via INTRAVENOUS
  Administered 2019-11-02: 10 mg via INTRAVENOUS

## 2019-11-02 MED ORDER — ADULT MULTIVITAMIN W/MINERALS CH
1.0000 | ORAL_TABLET | Freq: Every day | ORAL | Status: DC
  Administered 2019-11-03 – 2019-11-07 (×5): 1 via ORAL
  Filled 2019-11-02 (×5): qty 1

## 2019-11-02 MED ORDER — CELECOXIB 200 MG PO CAPS: 400.0000 mg | ORAL_CAPSULE | Freq: Once | ORAL | Status: AC

## 2019-11-02 MED ORDER — PROPOFOL 500 MG/50ML IV EMUL
INTRAVENOUS | Status: AC
  Filled 2019-11-02: qty 50

## 2019-11-02 MED ORDER — FLEET ENEMA 7-19 GM/118ML RE ENEM: 1.0000 | ENEMA | Freq: Once | RECTAL | Status: DC | PRN

## 2019-11-02 MED ORDER — DEXMEDETOMIDINE HCL 200 MCG/2ML IV SOLN
INTRAVENOUS | Status: DC | PRN
  Administered 2019-11-02: 8 ug via INTRAVENOUS
  Administered 2019-11-02 (×2): 12 ug via INTRAVENOUS

## 2019-11-02 MED ORDER — LEVETIRACETAM 500 MG PO TABS
500.0000 mg | ORAL_TABLET | Freq: Once | ORAL | Status: AC
  Administered 2019-11-02: 500 mg via ORAL
  Filled 2019-11-02: qty 1

## 2019-11-02 MED ORDER — SODIUM CHLORIDE 0.9 % IV SOLN
INTRAVENOUS | Status: DC | PRN
  Administered 2019-11-02: 60 mL

## 2019-11-02 MED ORDER — GLYCOPYRROLATE 0.2 MG/ML IJ SOLN
INTRAMUSCULAR | Status: AC
  Filled 2019-11-02: qty 1

## 2019-11-02 MED ORDER — BISACODYL 10 MG RE SUPP
10.0000 mg | Freq: Every day | RECTAL | Status: DC | PRN
  Administered 2019-11-06: 10 mg via RECTAL
  Filled 2019-11-02: qty 1

## 2019-11-02 MED ORDER — TRAMADOL HCL 50 MG PO TABS: 50.0000 mg | ORAL_TABLET | ORAL | Status: DC | PRN

## 2019-11-02 MED ORDER — BUPIVACAINE HCL (PF) 0.25 % IJ SOLN
INTRAMUSCULAR | Status: DC | PRN
  Administered 2019-11-02: 60 mL

## 2019-11-02 MED ORDER — BUPIVACAINE HCL (PF) 0.5 % IJ SOLN
INTRAMUSCULAR | Status: AC
  Filled 2019-11-02: qty 10

## 2019-11-02 MED ORDER — DEXAMETHASONE SODIUM PHOSPHATE 10 MG/ML IJ SOLN: 8.0000 mg | Freq: Once | INTRAMUSCULAR | Status: AC

## 2019-11-02 MED ORDER — ACETAMINOPHEN 10 MG/ML IV SOLN
1000.0000 mg | Freq: Four times a day (QID) | INTRAVENOUS | Status: AC
  Administered 2019-11-02 – 2019-11-03 (×4): 1000 mg via INTRAVENOUS
  Filled 2019-11-02 (×4): qty 100

## 2019-11-02 MED ORDER — TRANEXAMIC ACID-NACL 1000-0.7 MG/100ML-% IV SOLN
1000.0000 mg | INTRAVENOUS | Status: AC
  Administered 2019-11-02: 1000 mg via INTRAVENOUS

## 2019-11-02 MED ORDER — ASCORBIC ACID 500 MG PO TABS
500.0000 mg | ORAL_TABLET | Freq: Every day | ORAL | Status: DC
  Administered 2019-11-03 – 2019-11-07 (×5): 500 mg via ORAL
  Filled 2019-11-02 (×5): qty 1

## 2019-11-02 MED ORDER — FERROUS SULFATE 325 (65 FE) MG PO TABS
325.0000 mg | ORAL_TABLET | Freq: Two times a day (BID) | ORAL | Status: DC
  Administered 2019-11-03 – 2019-11-07 (×9): 325 mg via ORAL
  Filled 2019-11-02 (×9): qty 1

## 2019-11-02 MED ORDER — INSULIN ASPART 100 UNIT/ML ~~LOC~~ SOLN
0.0000 [IU] | Freq: Three times a day (TID) | SUBCUTANEOUS | Status: DC
  Administered 2019-11-03 – 2019-11-07 (×5): 2 [IU] via SUBCUTANEOUS
  Filled 2019-11-02 (×5): qty 1

## 2019-11-02 MED ORDER — CEFAZOLIN SODIUM-DEXTROSE 2-4 GM/100ML-% IV SOLN
2.0000 g | INTRAVENOUS | Status: AC
  Administered 2019-11-02: 12:00:00 2 g via INTRAVENOUS

## 2019-11-02 MED ORDER — LORATADINE 10 MG PO TABS
10.0000 mg | ORAL_TABLET | Freq: Every day | ORAL | Status: DC
  Administered 2019-11-03 – 2019-11-07 (×3): 10 mg via ORAL
  Filled 2019-11-02 (×3): qty 1

## 2019-11-02 MED ORDER — FENTANYL CITRATE (PF) 100 MCG/2ML IJ SOLN: 25.0000 ug | INTRAMUSCULAR | Status: DC | PRN

## 2019-11-02 MED ORDER — MENTHOL 3 MG MT LOZG
1.0000 | LOZENGE | OROMUCOSAL | Status: DC | PRN
  Filled 2019-11-02: qty 9

## 2019-11-02 MED ORDER — DILTIAZEM HCL ER COATED BEADS 240 MG PO CP24
240.0000 mg | ORAL_CAPSULE | Freq: Every day | ORAL | Status: DC
  Administered 2019-11-03 – 2019-11-07 (×5): 240 mg via ORAL
  Filled 2019-11-02 (×5): qty 1

## 2019-11-02 MED ORDER — GABAPENTIN 300 MG PO CAPS
300.0000 mg | ORAL_CAPSULE | Freq: Every day | ORAL | Status: DC
  Administered 2019-11-02 – 2019-11-06 (×5): 300 mg via ORAL
  Filled 2019-11-02 (×5): qty 1

## 2019-11-02 MED ORDER — FLUTICASONE PROPIONATE 50 MCG/ACT NA SUSP
2.0000 | Freq: Every day | NASAL | Status: DC | PRN
  Filled 2019-11-02: qty 16

## 2019-11-02 MED ORDER — ALUM & MAG HYDROXIDE-SIMETH 200-200-20 MG/5ML PO SUSP: 30.0000 mL | ORAL | Status: DC | PRN

## 2019-11-02 MED ORDER — ONDANSETRON HCL 4 MG/2ML IJ SOLN: 4.0000 mg | Freq: Four times a day (QID) | INTRAMUSCULAR | Status: DC | PRN

## 2019-11-02 MED ORDER — OXYCODONE HCL 5 MG PO TABS
10.0000 mg | ORAL_TABLET | ORAL | Status: DC | PRN
  Administered 2019-11-05 – 2019-11-07 (×2): 10 mg via ORAL
  Filled 2019-11-02 (×2): qty 2

## 2019-11-02 MED ORDER — OXYMETAZOLINE HCL 0.05 % NA SOLN
NASAL | Status: AC
  Filled 2019-11-02: qty 30

## 2019-11-02 MED ORDER — GABAPENTIN 300 MG PO CAPS: 300.0000 mg | ORAL_CAPSULE | Freq: Once | ORAL | Status: AC

## 2019-11-02 MED ORDER — CEFAZOLIN SODIUM-DEXTROSE 2-4 GM/100ML-% IV SOLN
INTRAVENOUS | Status: AC
  Filled 2019-11-02: qty 100

## 2019-11-02 MED ORDER — PHENOL 1.4 % MT LIQD
1.0000 | OROMUCOSAL | Status: DC | PRN
  Filled 2019-11-02: qty 177

## 2019-11-02 MED ORDER — PHENYLEPHRINE HCL (PRESSORS) 10 MG/ML IV SOLN
INTRAVENOUS | Status: DC | PRN
  Administered 2019-11-02 (×3): 100 ug via INTRAVENOUS
  Administered 2019-11-02: 200 ug via INTRAVENOUS

## 2019-11-02 MED ORDER — METOPROLOL SUCCINATE ER 50 MG PO TB24
50.0000 mg | ORAL_TABLET | Freq: Every day | ORAL | Status: DC
  Administered 2019-11-03 – 2019-11-07 (×5): 50 mg via ORAL
  Filled 2019-11-02 (×5): qty 1

## 2019-11-02 MED ORDER — METOCLOPRAMIDE HCL 10 MG PO TABS
10.0000 mg | ORAL_TABLET | Freq: Three times a day (TID) | ORAL | Status: AC
  Administered 2019-11-02 – 2019-11-04 (×8): 10 mg via ORAL
  Filled 2019-11-02 (×8): qty 1

## 2019-11-02 MED ORDER — CELECOXIB 200 MG PO CAPS
ORAL_CAPSULE | ORAL | Status: AC
  Administered 2019-11-02: 400 mg via ORAL
  Filled 2019-11-02: qty 2

## 2019-11-02 MED ORDER — METOCLOPRAMIDE HCL 10 MG PO TABS: 5.0000 mg | ORAL_TABLET | Freq: Three times a day (TID) | ORAL | Status: DC | PRN

## 2019-11-02 MED ORDER — ACETAMINOPHEN 10 MG/ML IV SOLN
INTRAVENOUS | Status: AC
  Filled 2019-11-02: qty 100

## 2019-11-02 MED ORDER — LEVETIRACETAM 500 MG PO TABS
500.0000 mg | ORAL_TABLET | Freq: Two times a day (BID) | ORAL | Status: DC
  Administered 2019-11-02 – 2019-11-07 (×10): 500 mg via ORAL
  Filled 2019-11-02 (×12): qty 1

## 2019-11-02 MED ORDER — ACETAMINOPHEN 325 MG PO TABS: 325.0000 mg | ORAL_TABLET | Freq: Four times a day (QID) | ORAL | Status: DC | PRN

## 2019-11-02 MED ORDER — CHLORHEXIDINE GLUCONATE 4 % EX LIQD
60.0000 mL | Freq: Once | CUTANEOUS | Status: AC
  Administered 2019-11-02: 4 via TOPICAL

## 2019-11-02 MED ORDER — MAGNESIUM HYDROXIDE 400 MG/5ML PO SUSP
30.0000 mL | Freq: Every day | ORAL | Status: DC
  Administered 2019-11-03 – 2019-11-07 (×5): 30 mL via ORAL
  Filled 2019-11-02 (×5): qty 30

## 2019-11-02 MED ORDER — TORSEMIDE 20 MG PO TABS
20.0000 mg | ORAL_TABLET | Freq: Every day | ORAL | Status: DC
  Administered 2019-11-03 – 2019-11-07 (×5): 20 mg via ORAL
  Filled 2019-11-02 (×5): qty 1

## 2019-11-02 MED ORDER — GLYCOPYRROLATE 0.2 MG/ML IJ SOLN
INTRAMUSCULAR | Status: DC | PRN
  Administered 2019-11-02: .2 mg via INTRAVENOUS

## 2019-11-02 MED ORDER — RIVAROXABAN 20 MG PO TABS
20.0000 mg | ORAL_TABLET | Freq: Every day | ORAL | Status: DC
  Administered 2019-11-03 – 2019-11-07 (×5): 20 mg via ORAL
  Filled 2019-11-02 (×5): qty 1

## 2019-11-02 MED ORDER — FAMOTIDINE 20 MG PO TABS
ORAL_TABLET | ORAL | Status: AC
  Administered 2019-11-02: 20 mg via ORAL
  Filled 2019-11-02: qty 1

## 2019-11-02 MED ORDER — PROPOFOL 500 MG/50ML IV EMUL
INTRAVENOUS | Status: DC | PRN
  Administered 2019-11-02: 50 ug/kg/min via INTRAVENOUS

## 2019-11-02 MED ORDER — ROCURONIUM BROMIDE 10 MG/ML (PF) SYRINGE
PREFILLED_SYRINGE | INTRAVENOUS | Status: AC
  Filled 2019-11-02: qty 10

## 2019-11-02 MED ORDER — NEOMYCIN-POLYMYXIN B GU 40-200000 IR SOLN
Status: DC | PRN
  Administered 2019-11-02: 16 mL

## 2019-11-02 MED ORDER — OXYCODONE HCL 5 MG PO TABS
5.0000 mg | ORAL_TABLET | ORAL | Status: DC | PRN
  Administered 2019-11-02 – 2019-11-06 (×2): 5 mg via ORAL
  Filled 2019-11-02 (×2): qty 1

## 2019-11-02 MED ORDER — SENNOSIDES-DOCUSATE SODIUM 8.6-50 MG PO TABS
1.0000 | ORAL_TABLET | Freq: Two times a day (BID) | ORAL | Status: DC
  Administered 2019-11-02 – 2019-11-07 (×10): 1 via ORAL
  Filled 2019-11-02 (×10): qty 1

## 2019-11-02 MED ORDER — GABAPENTIN 300 MG PO CAPS
ORAL_CAPSULE | ORAL | Status: AC
  Administered 2019-11-02: 300 mg via ORAL
  Filled 2019-11-02: qty 1

## 2019-11-02 MED ORDER — ONDANSETRON HCL 4 MG PO TABS: 4.0000 mg | ORAL_TABLET | Freq: Four times a day (QID) | ORAL | Status: DC | PRN

## 2019-11-02 MED ORDER — ACETAMINOPHEN 10 MG/ML IV SOLN
INTRAVENOUS | Status: DC | PRN
  Administered 2019-11-02: 1000 mg via INTRAVENOUS

## 2019-11-02 MED ORDER — PROPOFOL 10 MG/ML IV BOLUS
INTRAVENOUS | Status: DC | PRN
  Administered 2019-11-02: 40 mg via INTRAVENOUS
  Administered 2019-11-02 (×3): 30 mg via INTRAVENOUS

## 2019-11-02 MED ORDER — PANTOPRAZOLE SODIUM 40 MG PO TBEC
40.0000 mg | DELAYED_RELEASE_TABLET | Freq: Two times a day (BID) | ORAL | Status: DC
  Administered 2019-11-02 – 2019-11-07 (×10): 40 mg via ORAL
  Filled 2019-11-02 (×10): qty 1

## 2019-11-02 MED ORDER — TRANEXAMIC ACID-NACL 1000-0.7 MG/100ML-% IV SOLN
1000.0000 mg | Freq: Once | INTRAVENOUS | Status: AC
  Administered 2019-11-02: 1000 mg via INTRAVENOUS

## 2019-11-02 MED ORDER — METOCLOPRAMIDE HCL 5 MG/ML IJ SOLN: 5.0000 mg | Freq: Three times a day (TID) | INTRAMUSCULAR | Status: DC | PRN

## 2019-11-02 MED ORDER — DEXAMETHASONE SODIUM PHOSPHATE 10 MG/ML IJ SOLN
INTRAMUSCULAR | Status: AC
  Administered 2019-11-02: 8 mg via INTRAVENOUS
  Filled 2019-11-02: qty 1

## 2019-11-02 MED ORDER — OXYMETAZOLINE HCL 0.05 % NA SOLN
NASAL | Status: DC | PRN
  Administered 2019-11-02: 2 via NASAL

## 2019-11-02 MED ORDER — DIPHENHYDRAMINE HCL 12.5 MG/5ML PO ELIX: 12.5000 mg | ORAL_SOLUTION | ORAL | Status: DC | PRN

## 2019-11-02 MED ORDER — BUPIVACAINE HCL (PF) 0.5 % IJ SOLN
INTRAMUSCULAR | Status: DC | PRN
  Administered 2019-11-02: 3 mL

## 2019-11-02 SURGICAL SUPPLY — 76 items
ATTUNE MED DOME PAT 41 KNEE (Knees) ×1 IMPLANT
ATTUNE PS FEM RT SZ 6 CEM KNEE (Femur) ×1 IMPLANT
ATTUNE PSRP INSR SZ6 5 KNEE (Insert) ×1 IMPLANT
BASE TIBIAL ROT PLAT SZ 7 KNEE (Knees) IMPLANT
BATTERY INSTRU NAVIGATION (MISCELLANEOUS) ×8 IMPLANT
BLADE CLIPPER SURG (BLADE) ×1 IMPLANT
BLADE SAW 70X12.5 (BLADE) ×2 IMPLANT
BLADE SAW 90X13X1.19 OSCILLAT (BLADE) ×2 IMPLANT
BLADE SAW 90X25X1.19 OSCILLAT (BLADE) ×3 IMPLANT
BONE CEMENT GENTAMICIN (Cement) ×4 IMPLANT
CANISTER SUCT 3000ML PPV (MISCELLANEOUS) ×2 IMPLANT
CANISTER WOUND CARE 500ML ATS (WOUND CARE) ×1 IMPLANT
CEMENT BONE GENTAMICIN 40 (Cement) IMPLANT
COOLER POLAR GLACIER W/PUMP (MISCELLANEOUS) ×2 IMPLANT
COVER WAND RF STERILE (DRAPES) ×2 IMPLANT
CUFF TOURN SGL QUICK 34 (TOURNIQUET CUFF) ×2
CUFF TRNQT CYL 34X4.125X (TOURNIQUET CUFF) IMPLANT
DRAPE 3/4 80X56 (DRAPES) ×2 IMPLANT
DRSG DERMACEA 8X12 NADH (GAUZE/BANDAGES/DRESSINGS) ×2 IMPLANT
DRSG OPSITE POSTOP 4X14 (GAUZE/BANDAGES/DRESSINGS) ×2 IMPLANT
DRSG TEGADERM 4X4.75 (GAUZE/BANDAGES/DRESSINGS) ×2 IMPLANT
DURAPREP 26ML APPLICATOR (WOUND CARE) ×4 IMPLANT
ELECT REM PT RETURN 9FT ADLT (ELECTROSURGICAL) ×2
ELECTRODE REM PT RTRN 9FT ADLT (ELECTROSURGICAL) ×1 IMPLANT
EX-PIN ORTHOLOCK NAV 4X150 (PIN) ×4 IMPLANT
GLOVE BIO SURGEON STRL SZ7.5 (GLOVE) ×5 IMPLANT
GLOVE BIOGEL M STRL SZ7.5 (GLOVE) ×4 IMPLANT
GLOVE BIOGEL PI IND STRL 7.5 (GLOVE) ×1 IMPLANT
GLOVE BIOGEL PI INDICATOR 7.5 (GLOVE) ×1
GLOVE INDICATOR 8.0 STRL GRN (GLOVE) ×2 IMPLANT
GOWN STRL REUS W/ TWL LRG LVL3 (GOWN DISPOSABLE) ×2 IMPLANT
GOWN STRL REUS W/ TWL XL LVL3 (GOWN DISPOSABLE) ×1 IMPLANT
GOWN STRL REUS W/TWL LRG LVL3 (GOWN DISPOSABLE) ×4
GOWN STRL REUS W/TWL XL LVL3 (GOWN DISPOSABLE) ×2
HEMOVAC 400CC 10FR (MISCELLANEOUS) ×2 IMPLANT
HOLDER FOLEY CATH W/STRAP (MISCELLANEOUS) ×2 IMPLANT
HOOD PEEL AWAY FLYTE STAYCOOL (MISCELLANEOUS) ×5 IMPLANT
KIT PREVENA INCISION MGT20CM45 (CANNISTER) ×1 IMPLANT
KIT TURNOVER KIT A (KITS) ×2 IMPLANT
KNIFE SCULPS 14X20 (INSTRUMENTS) ×2 IMPLANT
LABEL OR SOLS (LABEL) ×2 IMPLANT
MANIFOLD NEPTUNE II (INSTRUMENTS) ×2 IMPLANT
NDL SAFETY ECLIPSE 18X1.5 (NEEDLE) ×1 IMPLANT
NDL SPNL 20GX3.5 QUINCKE YW (NEEDLE) ×2 IMPLANT
NEEDLE HYPO 18GX1.5 SHARP (NEEDLE) ×2
NEEDLE SPNL 20GX3.5 QUINCKE YW (NEEDLE) ×4 IMPLANT
NS IRRIG 500ML POUR BTL (IV SOLUTION) ×2 IMPLANT
PACK TOTAL KNEE (MISCELLANEOUS) ×2 IMPLANT
PAD WRAPON POLAR KNEE (MISCELLANEOUS) ×1 IMPLANT
PENCIL SMOKE EVACUATOR COATED (MISCELLANEOUS) ×2 IMPLANT
PENCIL SMOKE ULTRAEVAC 22 CON (MISCELLANEOUS) ×2 IMPLANT
PIN DRILL QUICK PACK ×2 IMPLANT
PIN FIXATION 1/8DIA X 3INL (PIN) ×6 IMPLANT
PULSAVAC PLUS IRRIG FAN TIP (DISPOSABLE) ×2
SOL .9 NS 3000ML IRR  AL (IV SOLUTION) ×2
SOL .9 NS 3000ML IRR UROMATIC (IV SOLUTION) ×1 IMPLANT
SOL PREP PVP 2OZ (MISCELLANEOUS) ×2
SOLUTION PREP PVP 2OZ (MISCELLANEOUS) ×1 IMPLANT
SPONGE DRAIN TRACH 4X4 STRL 2S (GAUZE/BANDAGES/DRESSINGS) ×2 IMPLANT
SPONGE LAP 18X18 RF (DISPOSABLE) ×1 IMPLANT
STAPLER SKIN PROX 35W (STAPLE) ×2 IMPLANT
STOCKINETTE IMPERV 14X48 (MISCELLANEOUS) IMPLANT
STRAP TIBIA SHORT (MISCELLANEOUS) ×2 IMPLANT
SUCTION FRAZIER HANDLE 10FR (MISCELLANEOUS) ×2
SUCTION TUBE FRAZIER 10FR DISP (MISCELLANEOUS) ×1 IMPLANT
SUT VIC AB 0 CT1 36 (SUTURE) ×4 IMPLANT
SUT VIC AB 1 CT1 36 (SUTURE) ×4 IMPLANT
SUT VIC AB 2-0 CT2 27 (SUTURE) ×2 IMPLANT
SYR 20ML LL LF (SYRINGE) ×2 IMPLANT
SYR 30ML LL (SYRINGE) ×4 IMPLANT
TIBIAL BASE ROT PLAT SZ 7 KNEE (Knees) ×2 IMPLANT
TIP FAN IRRIG PULSAVAC PLUS (DISPOSABLE) ×1 IMPLANT
TOWEL OR 17X26 4PK STRL BLUE (TOWEL DISPOSABLE) ×2 IMPLANT
TOWER CARTRIDGE SMART MIX (DISPOSABLE) ×2 IMPLANT
TRAY FOLEY MTR SLVR 16FR STAT (SET/KITS/TRAYS/PACK) ×2 IMPLANT
WRAPON POLAR PAD KNEE (MISCELLANEOUS) ×2

## 2019-11-02 NOTE — Anesthesia Preprocedure Evaluation (Signed)
Anesthesia Evaluation  Patient identified by MRN, date of birth, ID band Patient awake    Reviewed: Allergy & Precautions, H&P , NPO status , [name] Chart, lab work & pertinent test results, reviewed documented beta blocker date and time   History of Anesthesia Complications Negative for: history of anesthetic complications  Airway Mallampati: II  TM Distance: >3 FB Neck ROM: full    Dental  (+) Dental Advidsory Given   Pulmonary neg shortness of breath, sleep apnea and Continuous Positive Airway Pressure Ventilation , neg COPD, neg recent URI, former smoker,    Pulmonary exam normal breath sounds clear to auscultation       Cardiovascular Exercise Tolerance: Good hypertension, (-) angina+ Peripheral Vascular Disease  (-) Past MI and (-) Cardiac Stents Normal cardiovascular exam(-) dysrhythmias (-) Valvular Problems/Murmurs Rhythm:regular Rate:Normal     Neuro/Psych Seizures -, Well Controlled,  negative psych ROS   GI/Hepatic negative GI ROS, Neg liver ROS,   Endo/Other  diabetes, Well Controlled  Renal/GU CRFRenal disease  negative genitourinary   Musculoskeletal   Abdominal   Peds  Hematology negative hematology ROS (+)   Anesthesia Other Findings Past Medical History: No date: Arthritis No date: Bilateral swelling of feet No date: BPH (benign prostatic hyperplasia) No date: Chronic kidney disease No date: Diabetes mellitus without complication (HCC) No date: DVT (deep venous thrombosis) (HCC)     Comment:  right lower extremity No date: History of gout No date: Hypertension No date: Seizures ([place]) No date: Sleep apnea No date: Tendonitis of foot     Comment:  left   Reproductive/Obstetrics negative OB ROS                             Anesthesia Physical Anesthesia Plan  ASA: III  Anesthesia Plan: Spinal   Post-op Pain Management:    Induction:   PONV Risk Score and  Plan: Propofol infusion and TIVA  Airway Management Planned: Natural Airway and Simple Face Mask  Additional Equipment:   Intra-op Plan:   Post-operative Plan:   Informed Consent: I have reviewed the patients History and Physical, chart, labs and discussed the procedure including the risks, benefits and alternatives for the proposed anesthesia with the patient or authorized representative who has indicated his/her understanding and acceptance.     Dental Advisory Given  Plan Discussed with: Anesthesiologist, CRNA and Surgeon  Anesthesia Plan Comments:         Anesthesia Quick Evaluation

## 2019-11-02 NOTE — H&P (Signed)
The patient has been re-examined, and the chart reviewed, and there have been no interval changes to the documented history and physical.    The risks, benefits, and alternatives have been discussed at length. The patient expressed understanding of the risks benefits and agreed with plans for surgical intervention.  Mustaf Antonacci P. Petra Sargeant, Jr. M.D.    

## 2019-11-02 NOTE — Op Note (Signed)
OPERATIVE NOTE  DATE OF SURGERY:  11/02/2019  PATIENT NAME:  [name].   DOB: [birth year]  MRN: [id number]  PRE-OPERATIVE DIAGNOSIS: Degenerative arthrosis of the right knee, primary  POST-OPERATIVE DIAGNOSIS:  Same  PROCEDURE:  Right total knee arthroplasty using computer-assisted navigation  SURGEON:  Marciano Sequin. M.D.  ASSISTANT: [name], PA-C (present and scrubbed throughout the case, critical for assistance with exposure, retraction, instrumentation, and closure)  ANESTHESIA: spinal  ESTIMATED BLOOD LOSS: 50 mL  FLUIDS REPLACED: 1100 mL of crystalloid  TOURNIQUET TIME: 116 minutes  DRAINS: 2 medium Hemovac drains  SOFT TISSUE RELEASES: Anterior cruciate ligament, posterior cruciate ligament, deep medial collateral ligament, patellofemoral ligament  IMPLANTS UTILIZED: DePuy Attune size 6 posterior stabilized femoral component (cemented), size 7 rotating platform tibial component (cemented), 41 mm medialized dome patella (cemented), and a 5 mm stabilized rotating platform polyethylene insert.  INDICATIONS FOR SURGERY: Orlyn Odonoghue. is a 79 y.o. year old male with a long history of progressive knee pain. X-rays demonstrated severe degenerative changes in tricompartmental fashion. The patient had not seen any significant improvement despite conservative nonsurgical intervention. After discussion of the risks and benefits of surgical intervention, the patient expressed understanding of the risks benefits and agree with plans for total knee arthroplasty.   The risks, benefits, and alternatives were discussed at length including but not limited to the risks of infection, bleeding, nerve injury, stiffness, blood clots, the need for revision surgery, cardiopulmonary complications, among others, and they were willing to proceed.  PROCEDURE IN DETAIL: The patient was brought into the operating room and, after adequate spinal anesthesia was achieved, a tourniquet was  placed on the patient's upper thigh. The patient's knee and leg were cleaned and prepped with alcohol and DuraPrep and draped in the usual sterile fashion. A "timeout" was performed as per usual protocol. The lower extremity was exsanguinated using an Esmarch, and the tourniquet was inflated to 300 mmHg. An anterior longitudinal incision was made followed by a standard mid vastus approach. The deep fibers of the medial collateral ligament were elevated in a subperiosteal fashion off of the medial flare of the tibia so as to maintain a continuous soft tissue sleeve. The patella was subluxed laterally and the patellofemoral ligament was incised. Inspection of the knee demonstrated severe degenerative changes with full-thickness loss of articular cartilage. Osteophytes were debrided using a rongeur. Anterior and posterior cruciate ligaments were excised. Two 4.0 mm Schanz pins were inserted in the femur and into the tibia for attachment of the array of trackers used for computer-assisted navigation. Hip center was identified using a circumduction technique. Distal landmarks were mapped using the computer. The distal femur and proximal tibia were mapped using the computer. The distal femoral cutting guide was positioned using computer-assisted navigation so as to achieve a 5 distal valgus cut. The femur was sized and it was felt that a size 6 femoral component was appropriate. A size 6 femoral cutting guide was positioned and the anterior cut was performed and verified using the computer. This was followed by completion of the posterior and chamfer cuts. Femoral cutting guide for the central box was then positioned in the center box cut was performed.  Attention was then directed to the proximal tibia. Medial and lateral menisci were excised. The extramedullary tibial cutting guide was positioned using computer-assisted navigation so as to achieve a 0 varus-valgus alignment and 3 posterior slope. The cut was  performed and verified using the computer. The proximal tibia was  sized and it was felt that a size 7 tibial tray was appropriate. Tibial and femoral trials were inserted followed by insertion of a 5 mm polyethylene insert. This allowed for excellent mediolateral soft tissue balancing both in flexion and in full extension. Finally, the patella was cut and prepared so as to accommodate a 41 mm medialized dome patella. A patella trial was placed and the knee was placed through a range of motion with excellent patellar tracking appreciated. The femoral trial was removed after debridement of posterior osteophytes. The central post-hole for the tibial component was reamed followed by insertion of a keel punch. Tibial trials were then removed. Cut surfaces of bone were irrigated with copious amounts of normal saline with antibiotic solution using pulsatile lavage and then suctioned dry. Polymethylmethacrylate cement with gentamicin was prepared in the usual fashion using a vacuum mixer. Cement was applied to the cut surface of the proximal tibia as well as along the undersurface of a size 7 rotating platform tibial component. Tibial component was positioned and impacted into place. Excess cement was removed using [employer] fast streamer. Cement was then applied to the cut surfaces of the femur as well as along the posterior flanges of the size 6 femoral component. The femoral component was positioned and impacted into place. Excess cement was removed using [employer] fast streamer. A 5 mm polyethylene trial was inserted and the knee was brought into full extension with steady axial compression applied. Finally, cement was applied to the backside of a 41 mm medialized dome patella and the patellar component was positioned and patellar clamp applied. Excess cement was removed using [employer] fast streamer. After adequate curing of the cement, the tourniquet was deflated after a total tourniquet time of 116 minutes. Hemostasis was achieved using  electrocautery. The knee was irrigated with copious amounts of normal saline with antibiotic solution using pulsatile lavage and then suctioned dry. 20 mL of 1.3% Exparel and 60 mL of 0.25% Marcaine in 40 mL of normal saline was injected along the posterior capsule, medial and lateral gutters, and along the arthrotomy site. A 5 mm stabilized rotating platform polyethylene insert was inserted and the knee was placed through a range of motion with excellent mediolateral soft tissue balancing appreciated and excellent patellar tracking noted. The medial parapatellar portion of the incision was reapproximated using interrupted sutures of #1 Vicryl. Subcutaneous tissue was approximated in layers using first #0 Vicryl followed #2-0 Vicryl. The skin was approximated with skin staples.  A wound VAC was applied to the incision.  A sterile dressing was applied.  The patient tolerated the procedure well and was transported to the recovery room in stable condition.    Karianne Nogueira P. Holley Bouche., M.D.

## 2019-11-02 NOTE — Anesthesia Procedure Notes (Signed)
Spinal  Patient location during procedure: OR Start time: 11/02/2019 12:00 PM End time: 11/02/2019 12:05 PM Staffing Performed: resident/CRNA  Anesthesiologist: Martha Clan, MD Resident/CRNA: Lia Foyer, CRNA Preanesthetic Checklist Completed: patient identified, IV checked, site marked, risks and benefits discussed, surgical consent, monitors and equipment checked, pre-op evaluation and timeout performed Spinal Block Patient position: sitting Prep: ChloraPrep Patient monitoring: heart rate, cardiac monitor, continuous pulse ox and blood pressure Approach: midline Location: L3-4 Injection technique: single-shot Needle Needle type: Pencan  Needle gauge: 24 G Needle length: 9 cm Assessment Sensory level: T4

## 2019-11-02 NOTE — Transfer of Care (Signed)
Immediate Anesthesia Transfer of Care Note  Patient: Justin Shannon.  Procedure(s) Performed: COMPUTER ASSISTED TOTAL KNEE ARTHROPLASTY (Right Knee) APPLICATION OF WOUND VAC (Right Knee)  Patient Location: PACU  Anesthesia Type:Spinal  Level of Consciousness: drowsy  Airway & Oxygen Therapy: Patient Spontanous Breathing and Patient connected to face mask oxygen  Post-op Assessment: Report given to RN and Post -op Vital signs reviewed and stable  Post vital signs: Reviewed and stable  Last Vitals:  Vitals Value Taken Time  BP 118/78 11/02/19 1627  Temp 36.4 C 11/02/19 1626  Pulse 70 11/02/19 1633  Resp 20 11/02/19 1633  SpO2 100 % 11/02/19 1633  Vitals shown include unvalidated device data.  Last Pain:  Vitals:   11/02/19 1626  TempSrc:   PainSc: (P) 0-No pain         Complications: No apparent anesthesia complications

## 2019-11-03 LAB — GLUCOSE, CAPILLARY
Glucose-Capillary: 114 mg/dL — ABNORMAL HIGH (ref 70–99)
Glucose-Capillary: 123 mg/dL — ABNORMAL HIGH (ref 70–99)
Glucose-Capillary: 111 mg/dL — ABNORMAL HIGH (ref 70–99)
Glucose-Capillary: 105 mg/dL — ABNORMAL HIGH (ref 70–99)

## 2019-11-03 NOTE — Progress Notes (Signed)
Patient foley out at 4:20 pm. Patient has only voided 20 mL since then. Patient was bladder scanned at 0052 and volume was at 82 mL. Patient given ginger ale.

## 2019-11-03 NOTE — Progress Notes (Signed)
Physical Therapy Treatment Patient Details Name: Justin Shannon. MRN: [id number] DOB: 04-03-1941 Today's Date: 11/03/2019    History of Present Illness Pt is POD1 s/p R TKA    PT Comments    Pt is making good progress towards goals with ability to ambulate in hallway with close chair follow. CUes for RW. Good endurance with HEP. Continues to be very motivated. Will continue to progress as able.   Follow Up Recommendations  SNF     Equipment Recommendations  None recommended by PT    Recommendations for Other Services       Precautions / Restrictions Precautions Precautions: Fall;Knee Precaution Booklet Issued: Yes (comment) Required Braces or Orthoses: (Bone Foam) Restrictions Weight Bearing Restrictions: Yes RLE Weight Bearing: Weight bearing as tolerated    Mobility  Bed Mobility               General bed mobility comments: Not performed as pt received in recliner  Transfers Overall transfer level: Needs assistance Equipment used: Rolling walker (2 wheeled) Transfers: Sit to/from Stand Sit to Stand: Mod assist;+2 physical assistance;From elevated surface         General transfer comment: safe technique with cues for hand placement and cues for sequencing prior to performance  Ambulation/Gait Ambulation/Gait assistance: Min assist Gait Distance (Feet): 100 Feet Assistive device: Rolling walker (2 wheeled) Gait Pattern/deviations: Step-to pattern     General Gait Details: ambulated in hallway with quick fatigue. CLose chair follow, however not needed. Very slow gait speed with heavy WBing on B UE. INcreased WOB noted with gait   Stairs             Wheelchair Mobility    Modified Rankin (Stroke Patients Only)       Balance Overall balance assessment: Needs assistance Sitting-balance support: Feet supported;Bilateral upper extremity supported Sitting balance-Leahy Scale: Good   Postural control: Other (comment)(posterior lean while  sitting; slight forward lean while standing) Standing balance support: Bilateral upper extremity supported Standing balance-Leahy Scale: Good                              Cognition Arousal/Alertness: Awake/alert Behavior During Therapy: WFL for tasks assessed/performed Overall Cognitive Status: Within Functional Limits for tasks assessed                                 General Comments: Pt did need increased time for processing and occasionally deferred to his wife and daughter to answer questions. Also needed multimodal cueing for sequencing movement.      Exercises Total Joint Exercises Goniometric ROM: R TKR 3-78 degrees Other Exercises Other Exercises: seated ther-ex performed on R LE including AP, quad sets, SLRs, hip abd/add, and SAQ. All ther-ex performed x 10 reps and min assist. Written HEP given and reviewed. Other Exercises: Pt completed sit <> stand from bedside chair using RW while therapist provided education on body mechanics and postural control to prepare for engagement in ADL/IADL.    General Comments        Pertinent Vitals/Pain Pain Assessment: 0-10 Pain Score: 7  Pain Location: R knee Pain Descriptors / Indicators: Operative site guarding;Aching Pain Intervention(s): Limited activity within patient's tolerance;Ice applied;Repositioned    Home Living Family/patient expects to be discharged to:: Private residence Living Arrangements: Spouse/significant other;Children(daughter) Available Help at Discharge: Family;Available PRN/intermittently(daughter available 24/7 but does work from home) Type of Home:  House Home Access: Stairs to enter Entrance Stairs-Rails: Right;Left Home Layout: One level Home Equipment: Environmental consultant - 2 wheels;Shower seat;Bedside commode;Cane - single point(lift chair; adjustable bed with rails attached)      Prior Function Level of Independence: Independent with assistive device(s)      Comments: Pt reports  that he uses a Rollator for household and community ambulation. He still has his license but does not drive often due to pain getting in and out of car. He has had some previous difficulties getting in and out of his tub/shower because of pain in his RLE. Wife reports he did need occasional assistance with ADL.   PT Goals (current goals can now be found in the care plan section) Acute Rehab PT Goals Patient Stated Goal: To go home and eventually be able to help out his grandson with yard work PT Goal Formulation: With patient Time For Goal Achievement: 11/17/19 Potential to Achieve Goals: Good Additional Goals Additional Goal #1: Pt will be able to perform bed mobility/transfers with cga and safe technique in order to improve functional independence Progress towards PT goals: Progressing toward goals    Frequency    BID      PT Plan Current plan remains appropriate    Co-evaluation              AM-PAC PT "6 Clicks" Mobility   Outcome Measure  Help needed turning from your back to your side while in a flat bed without using bedrails?: A Lot Help needed moving from lying on your back to sitting on the side of a flat bed without using bedrails?: A Lot Help needed moving to and from a bed to a chair (including a wheelchair)?: A Lot Help needed standing up from a chair using your arms (e.g., wheelchair or bedside chair)?: A Lot Help needed to walk in hospital room?: A Little Help needed climbing 3-5 steps with a railing? : A Lot 6 Click Score: 13    End of Session Equipment Utilized During Treatment: Gait belt Activity Tolerance: Patient tolerated treatment well Patient left: in chair;with chair alarm set;with SCD's reapplied Nurse Communication: Mobility status PT Visit Diagnosis: Muscle weakness (generalized) (M62.81);History of falling (Z91.81);Difficulty in walking, not elsewhere classified (R26.2);Pain Pain - Right/Left: Right Pain - part of body: Knee     Time:  1443-1540 PT Time Calculation (min) (ACUTE ONLY): 38 min  Charges:  $Gait Training: 23-37 mins $Therapeutic Exercise: 8-22 mins                     Greggory Stallion, PT, DPT [phone]    Latisa Belay 11/03/2019, 3:50 PM

## 2019-11-03 NOTE — TOC Progression Note (Signed)
Transition of Care (TOC) - Progression Note    Patient Details  Name: Justin Shannon. MRN: [id number] Date of Birth: [birth year]  Transition of Care [facility]) CM/SW Contact  Su Hilt, RN Phone Number: 11/03/2019, 12:01 PM  Clinical Narrative:    Requested the price of lovenox, will notify the patient once obtained, The patient is set up with [facility] for [facility] services, PT to eval for recommendations        Expected Discharge Plan and Services                                                 Social Determinants of Health (SDOH) Interventions    Readmission Risk Interventions No flowsheet data found.

## 2019-11-03 NOTE — Progress Notes (Signed)
Patient dangled legs, tolerated well.

## 2019-11-03 NOTE — Progress Notes (Signed)
Patient re-bladder scanned at 0329. Amount was 142 mL. Patient voided 20 mL afterwards.

## 2019-11-03 NOTE — Evaluation (Signed)
Physical Therapy Evaluation Patient Details Name: Justin Shannon. MRN: [id number] DOB: [birth year] Today's Date: 11/03/2019   History of Present Illness  Pt admitted for R TKR  Clinical Impression  Pt is a pleasant 79 year old male who was admitted for R TKR. Pt performs transfers with mod assist +2 and ambulation with min assist +2 and RW. Pt is very motivated, however needs significant physical assist at this time for transfer completion. Pt demonstrates ability to perform 10 SLRs with independence, therefore does not require KI for mobility. Pt demonstrates deficits with strength/ROM/mobility. Reports chronic weakness in B UE inhibiting functional strength. Pt demonstrates increased deficits with transfers (primarily uses lift chair at home). At this time, wouldn't be able to climb steps and perform adequate home distances without +2 assist. Would benefit from skilled PT to address above deficits and promote optimal return to PLOF; recommend transition to STR upon discharge from acute hospitalization. If pt could progress to +1 assist, may be able to dc home.     Follow Up Recommendations SNF    Equipment Recommendations  None recommended by PT    Recommendations for Other Services       Precautions / Restrictions Precautions Precautions: Fall;Knee Precaution Booklet Issued: No Restrictions Weight Bearing Restrictions: Yes RLE Weight Bearing: Weight bearing as tolerated      Mobility  Bed Mobility               General bed mobility comments: not performed as pt received in recliner  Transfers Overall transfer level: Needs assistance Equipment used: Rolling walker (2 wheeled) Transfers: Sit to/from Stand Sit to Stand: Mod assist;+2 physical assistance         General transfer comment: takes multiple attempts to perform. Once standing, follows commands for demonstrating upright posture and correct hand placement.  Ambulation/Gait Ambulation/Gait assistance: Min  assist;+2 physical assistance Gait Distance (Feet): 40 Feet Assistive device: Rolling walker (2 wheeled) Gait Pattern/deviations: Step-to pattern     General Gait Details: cues for sequencing. Pt inconsistently performed reciprocal gait pattern. Fatigues with increased distance  Stairs            Wheelchair Mobility    Modified Rankin (Stroke Patients Only)       Balance Overall balance assessment: Needs assistance Sitting-balance support: Feet supported;Bilateral upper extremity supported Sitting balance-Leahy Scale: Good     Standing balance support: Bilateral upper extremity supported Standing balance-Leahy Scale: Fair                               Pertinent Vitals/Pain Pain Assessment: 0-10 Pain Score: 5  Pain Location: R knee Pain Descriptors / Indicators: Operative site guarding Pain Intervention(s): Limited activity within patient's tolerance;Premedicated before session;Repositioned;Ice applied    Home Living Family/patient expects to be discharged to:: Private residence Living Arrangements: Spouse/significant other;Children(daughter) Available Help at Discharge: Family Type of Home: House Home Access: Stairs to enter Entrance Stairs-Rails: (too wide, both) Technical brewer of Steps: 4 Home Layout: One level Home Equipment: Walker - 2 wheels;Shower seat;Bedside commode;Cane - single point(lift chair, adjustable bed with railing attached)      Prior Function Level of Independence: Independent with assistive device(s)         Comments: Reports he has been using RW for household and community ambulation for 3-4 years     Hand Dominance        Extremity/Trunk Assessment   Upper Extremity Assessment Upper Extremity Assessment: Generalized weakness(B  UE grossly 4/5)    Lower Extremity Assessment Lower Extremity Assessment: Generalized weakness(R LE grossly 3/5; L LE grossly 4/5)       Communication   Communication: No  difficulties  Cognition Arousal/Alertness: Awake/alert Behavior During Therapy: WFL for tasks assessed/performed Overall Cognitive Status: Within Functional Limits for tasks assessed                                        General Comments      Exercises Total Joint Exercises Goniometric ROM: R TKR 3-78 degrees Other Exercises Other Exercises: Seated ther-ex performed on R LE including AP, quad sets, SLRs, and hip abd/ad. All ther-ex performed x min assist   Assessment/Plan    PT Assessment Patient needs continued PT services  PT Problem List Decreased strength;Decreased range of motion;Decreased activity tolerance;Decreased balance;Decreased mobility;Pain       PT Treatment Interventions DME instruction;Gait training;Stair training;Therapeutic exercise    PT Goals (Current goals can be found in the Care Plan section)  Acute Rehab PT Goals Patient Stated Goal: to do whatever I need to do PT Goal Formulation: With patient Time For Goal Achievement: 11/17/19 Potential to Achieve Goals: Good    Frequency BID   Barriers to discharge        Co-evaluation               AM-PAC PT "6 Clicks" Mobility  Outcome Measure Help needed turning from your back to your side while in a flat bed without using bedrails?: A Lot Help needed moving from lying on your back to sitting on the side of a flat bed without using bedrails?: A Lot Help needed moving to and from a bed to a chair (including a wheelchair)?: A Lot Help needed standing up from a chair using your arms (e.g., wheelchair or bedside chair)?: A Lot Help needed to walk in hospital room?: A Little Help needed climbing 3-5 steps with a railing? : A Lot 6 Click Score: 13    End of Session Equipment Utilized During Treatment: Gait belt Activity Tolerance: Patient tolerated treatment well Patient left: in chair;with chair alarm set;with SCD's reapplied Nurse Communication: Mobility status PT Visit  Diagnosis: Muscle weakness (generalized) (M62.81);History of falling (Z91.81);Difficulty in walking, not elsewhere classified (R26.2);Pain Pain - Right/Left: Right Pain - part of body: Knee    Time: 2500-3704 PT Time Calculation (min) (ACUTE ONLY): 46 min   Charges:   PT Evaluation $PT Eval Moderate Complexity: 1 Mod PT Treatments $Gait Training: 8-22 mins $Therapeutic Exercise: 8-22 mins        Greggory Stallion, PT, DPT [phone]   Haley Fuerstenberg 11/03/2019, 12:18 PM

## 2019-11-03 NOTE — TOC Benefit Eligibility Note (Addendum)
Transition of Care [facility]) Benefit Eligibility Note    Patient Details  Name: Justin Shannon. MRN: [id number] Date of Birth: May 11, 1941   Medication/Dose: ENOXAPARIN 40 MG BID SQ X 14 DAYS  Covered?: Yes  TIER- 4 DRUG  Prescription Coverage Preferred Pharmacy: [facility] [place] with Person/Company/Phone Number:: [phone] [facility]  [employer] # [phone]  Co-Pay: $100.00   Q/L  24 SYRINGES FOR 30 DAYS   DEDUCTIBLE UNMET  And  OUT-OF-POCKET:UNMET     Additional Notes: LOVENOX  : NOT COVER and  NON-FORMULARY  P/A-YES # [phone]    Memory Argue Phone Number: 11/03/2019, 1:09 PM

## 2019-11-03 NOTE — Evaluation (Signed)
Occupational Therapy Evaluation Patient Details Name: Justin Shannon. MRN: [id number] DOB: [birth year] Today's Date: 11/03/2019    History of Present Illness Pt is POD1 s/p R TKA with relevant PMH of bilateral lymphedema, chronic stasis ulceration of RLE, h/o RLE DVT, HTN, and peripheral vascular disease   Clinical Impression   Pt presents this afternoon sitting up in bedside recliner with his wife and daughter in the room, agreeable to evaluation and motivated to "do what I can." He lives at home with his wife and oldest daughter in a one story house with 4 stairs to enter and wide right and left side rails, a walk-in shower with a chair, and a riser over his toilet. Prior to surgery, pt was modified independent in mobility with a rollator and his wife reported he occasionally did need some help with ADL. His family has been driving him recently and his daughter also reported that he has had 2 falls in the past year due to instability when reaching. 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 Pt currently presents with RLE deficits in strength/ROM, impaired balance and pain that prevents optimal and safe engagement in ADL/IADL and functional mobility. Therapist educated pt, spouse and daughter on compression stocking mgt, polar care mgt, toileting and sleep positioning. Pt was receptive to education with increased time for processing. Handout provided for carryover and retention of information. Pt completed sit <> stand from recliner with Min A x2 using RW while therapist provided education on body mechanics and postural control to improve transfer technique in preparation for engagement in ADL/IADL. He will benefit from skilled acute OT services while in the hospital to address ADL  completion with AE and falls prevention education to maximize independence, reduce caregiver burden, and maximize safety. STR recommended upon discharge based on pt's current need for physical assistance.       Follow Up Recommendations  SNF    Equipment Recommendations  3 in 1 bedside commode    Recommendations for Other Services       Precautions / Restrictions Precautions Precautions: Fall;Knee Precaution Booklet Issued: No Required Braces or Orthoses: (Bone Foam) Restrictions Weight Bearing Restrictions: Yes RLE Weight Bearing: Weight bearing as tolerated      Mobility Bed Mobility               General bed mobility comments: Not performed as pt received in recliner  Transfers Overall transfer level: Needs assistance Equipment used: Rolling walker (2 wheeled) Transfers: Sit to/from Stand Sit to Stand: Min assist;+2 physical assistance         General transfer comment: safe technique with cues for hand placement and cues for sequencing prior to performance    Balance Overall balance assessment: Needs assistance Sitting-balance support: Feet supported;Bilateral upper extremity supported Sitting balance-Leahy Scale: Good   Postural control: Posterior lean(upon initial stand, corrected with multimodal cueing) Standing balance support: Bilateral upper extremity supported Standing balance-Leahy Scale: Good Standing balance comment: initially poor, once in standing had fair static standing balance                            ADL either performed or assessed with clinical judgement   ADL Overall ADL's : Needs assistance/impaired  Lower Body Bathing: Sitting/lateral leans;Moderate assistance Lower Body Bathing Details (indicate cue type and reason): Pt could sponge bathe while sitting in chair with moderate assist for reaching past his knees and underneath of legs.     Lower Body Dressing: +2 for physical assistance;With adaptive  equipment;Cueing for safety;Cueing for sequencing;Sit to/from stand;Moderate assistance Lower Body Dressing Details (indicate cue type and reason): Pt requires multimodal cueing for sequencing and safety as well as Min A x2 during sit/stand transfers using a RW. Requires either adaptive equipment or assistance for threading and donning/doffing LB clothing. Toilet Transfer: +2 for physical assistance;Minimal assistance;Ambulation;BSC;RW;Cueing for safety;Cueing for sequencing Toilet Transfer Details (indicate cue type and reason): Pt requires multimodal cueing for sequencing and safety as well as Min A x2 during sit/stand transfers using a RW. BSC for elevated toilet seat is optimal for sit<>stand.          Functional mobility during ADLs: Rolling walker;Cueing for safety;Cueing for sequencing;+2 for physical assistance;Minimal assistance       Vision Baseline Vision/History: Wears glasses;Cataracts(reports plan for cataract surgery once medically appropriate) Wears Glasses: At all times Patient Visual Report: No change from baseline Vision Assessment?: No apparent visual deficits     Perception     Praxis      Pertinent Vitals/Pain Pain Assessment: 0-10 Pain Score: 5  Pain Location: Reports constant aching pain in his L arm due to arthritis; R knee as well Pain Descriptors / Indicators: Operative site guarding;Aching Pain Intervention(s): Limited activity within patient's tolerance;Monitored during session;Ice applied     Hand Dominance     Extremity/Trunk Assessment Upper Extremity Assessment Upper Extremity Assessment: Overall WFL for tasks assessed(Pain in LUE limits activity; grossly 4/5 for BUE)   Lower Extremity Assessment Lower Extremity Assessment: RLE deficits/detail RLE Deficits / Details: Expected deficits in RLE strength/ROM following surgery; was able to bear weight       Communication Communication Communication: No difficulties   Cognition  Arousal/Alertness: Awake/alert Behavior During Therapy: WFL for tasks assessed/performed Overall Cognitive Status: Within Functional Limits for tasks assessed                                 General Comments: Pt did need increased time for processing and occasionally deferred to his wife and daughter to answer questions. Also needed multimodal cueing for sequencing movement.   General Comments       Exercises Exercises: Other exercises;Total Joint Total Joint Exercises Goniometric ROM: R TKR 3-78 degrees Other Exercises Other Exercises: Educated pt, caregiver and daughter on compression stocking mgt, polar care mgt, ADL completion (toileting using BSC) and sleep positioning. Pt was receptive, with increased time for processing, to education. Handout provided for carryover and retention of information. Other Exercises: Pt completed sit <> stand from bedside chair using RW while therapist provided education on body mechanics and postural control to prepare for engagement in ADL/IADL.   Shoulder Instructions      Home Living Family/patient expects to be discharged to:: Private residence Living Arrangements: Spouse/significant other;Children(daughter) Available Help at Discharge: Family;Available PRN/intermittently(daughter available 24/7 but does work from home) Type of Home: House Home Access: Stairs to enter [employer] of Steps: 4 Entrance Stairs-Rails: Right;Left Home Layout: One level     Bathroom Shower/Tub: Tub/shower unit;Walk-in shower(daughter's bathroom has walk-in shower with shower seat)   Bathroom Toilet: Standard     Home Equipment: Walker - 2 wheels;Shower seat;Bedside commode;Cane - single point(lift chair; adjustable  bed with rails attached)          Prior Functioning/Environment Level of Independence: Independent with assistive device(s)        Comments: Pt reports that he uses a Rollator for household and community ambulation. He  still has his license but does not drive often due to pain getting in and out of car. He has had some previous difficulties getting in and out of his tub/shower because of pain in his RLE. Wife reports he did need occasional assistance with ADL.        OT Problem List: Decreased strength;Decreased range of motion;Decreased activity tolerance;Impaired balance (sitting and/or standing);Decreased knowledge of use of DME or AE;Pain      OT Treatment/Interventions: Self-care/ADL training;Therapeutic exercise;Therapeutic activities;Patient/family education;DME and/or AE instruction    OT Goals(Current goals can be found in the care plan section) Acute Rehab OT Goals Patient Stated Goal: To go home and eventually be able to help out his grandson with yard work OT Goal Formulation: With patient Time For Goal Achievement: 11/17/19 Potential to Achieve Goals: Good ADL Goals Pt Will Perform Lower Body Dressing: with adaptive equipment;sit to/from stand;with mod assist Pt Will Transfer to Toilet: bedside commode;ambulating;with mod assist Additional ADL Goal #1: Pt will independently verbalize compression stocking mgt to promote independence and maximize recovery Additional ADL Goal #2: Pt will independently verbalize polar care mgt to promote independence and maximize recovery  OT Frequency: Min 2X/week   Barriers to D/C:            Co-evaluation              AM-PAC OT "6 Clicks" Daily Activity     Outcome Measure Help from another person eating meals?: None Help from another person taking care of personal grooming?: None Help from another person toileting, which includes using toliet, bedpan, or urinal?: A Lot Help from another person bathing (including washing, rinsing, drying)?: A Lot Help from another person to put on and taking off regular upper body clothing?: None Help from another person to put on and taking off regular lower body clothing?: A Lot 6 Click Score: 18   End of  Session Equipment Utilized During Treatment: Gait belt;Rolling walker  Activity Tolerance: Patient tolerated treatment well Patient left: in chair;with call bell/phone within reach;with chair alarm set;with family/visitor present  OT Visit Diagnosis: Other abnormalities of gait and mobility (R26.89);History of falling (Z91.81);Pain Pain - Right/Left: Left(R knee) Pain - part of body: Shoulder;Arm;Hand                Time: 1884-1660 OT Time Calculation (min): 57 min Charges:  OT General Charges $OT Visit: 1 Visit OT Evaluation $OT Eval Moderate Complexity: 1 Mod OT Treatments $Self Care/Home Management : 23-37 mins $Therapeutic Activity: 8-22 mins  Jerilynn Birkenhead, OTS 11/03/19, 3:52 PM

## 2019-11-03 NOTE — Anesthesia Postprocedure Evaluation (Signed)
Anesthesia Post Note  Patient: Justin Shannon.  Procedure(s) Performed: COMPUTER ASSISTED TOTAL KNEE ARTHROPLASTY (Right Knee) APPLICATION OF WOUND VAC (Right Knee)  Patient location during evaluation: Nursing Unit Anesthesia Type: Spinal Level of consciousness: oriented and awake and alert Pain management: pain level controlled Vital Signs Assessment: post-procedure vital signs reviewed and stable Respiratory status: spontaneous breathing and respiratory function stable Cardiovascular status: blood pressure returned to baseline and stable Postop Assessment: no headache, no backache, no apparent nausea or vomiting and patient able to bend at knees Anesthetic complications: no     Last Vitals:  Vitals:   11/03/19 0018 11/03/19 0444  BP: (!) 160/79 136/63  Pulse: (!) 56 (!) 52  Resp: 17 16  Temp: 36.7 C (!) 36.4 C  SpO2: 100% 100%    Last Pain:  Vitals:   11/03/19 0444  TempSrc: Oral  PainSc:                  Caryl Asp

## 2019-11-03 NOTE — Progress Notes (Addendum)
  Subjective: 1 Day Post-Op Procedure(s) (LRB): COMPUTER ASSISTED TOTAL KNEE ARTHROPLASTY (Right) APPLICATION OF WOUND VAC (Right) Patient reports pain as well-controlled.   Patient is well, and has had no acute complaints or problems Plan is to go Home after hospital stay. Negative for chest pain and shortness of breath Fever: no Gastrointestinal: negative for nausea and vomiting.   Patient has not had a bowel movement.  Objective: Vital signs in last 24 hours: Temp:  [97 F (36.1 C)-98.2 F (36.8 C)] 97.5 F (36.4 C) (05/13 0444) Pulse Rate:  [52-72] 52 (05/13 0444) Resp:  [16-21] 16 (05/13 0444) BP: (118-160)/(63-92) 136/63 (05/13 0444) SpO2:  [98 %-100 %] 100 % (05/13 0444) Weight:  [125.1 kg] 125.1 kg (05/12 1751)  Intake/Output from previous day:  Intake/Output Summary (Last 24 hours) at 11/03/2019 0832 Last data filed at 11/03/2019 0335 Gross per 24 hour  Intake 2591.74 ml  Output 470 ml  Net 2121.74 ml    Intake/Output this shift: No intake/output data recorded.  Labs: No results for input(s): HGB in the last 72 hours. No results for input(s): WBC, RBC, HCT, PLT in the last 72 hours. No results for input(s): NA, K, CL, CO2, BUN, CREATININE, GLUCOSE, CALCIUM in the last 72 hours. No results for input(s): LABPT, INR in the last 72 hours.   EXAM General - Patient is Alert, Appropriate and Oriented Extremity - Neurovascular intact Dorsiflexion/Plantar flexion intact Compartment soft Dressing/Incision -Postoperative dressing remains in place., Polar Care in place and working. , Herbalist in place Motor Function - intact, moving foot and toes well on exam.  Cardiovascular- Regular rate and rhythm, no murmurs/rubs/gallops Respiratory- Lungs clear to auscultation bilaterally    Assessment/Plan: 1 Day Post-Op Procedure(s) (LRB): COMPUTER ASSISTED TOTAL KNEE ARTHROPLASTY (Right) APPLICATION OF WOUND VAC (Right) Active Problems:   Total knee replacement  status  Estimated body mass index is 38.47 kg/m as calculated from the following:   Height as of this encounter: 5\' 11"  (1.803 m).   Weight as of this encounter: 125.1 kg. Advance diet Up with therapy Plan for discharge tomorrow    DVT Prophylaxis - Xarelto, Ted hose and foot pumps Weight-Bearing as tolerated to right leg  [name], PA-C [facility] Orthopaedic Surgery 11/03/2019, 8:32 AM

## 2019-11-04 ENCOUNTER — Inpatient Hospital Stay: Payer: Medicare Other

## 2019-11-04 LAB — GLUCOSE, CAPILLARY
Glucose-Capillary: 78 mg/dL (ref 70–99)
Glucose-Capillary: 88 mg/dL (ref 70–99)
Glucose-Capillary: 109 mg/dL — ABNORMAL HIGH (ref 70–99)

## 2019-11-04 MED ORDER — ALBUTEROL SULFATE (2.5 MG/3ML) 0.083% IN NEBU
2.5000 mg | INHALATION_SOLUTION | Freq: Once | RESPIRATORY_TRACT | Status: AC
  Administered 2019-11-04: 2.5 mg via RESPIRATORY_TRACT
  Filled 2019-11-04: qty 3

## 2019-11-04 NOTE — Progress Notes (Signed)
Physical Therapy Treatment Patient Details Name: Justin Shannon. MRN: [id number] DOB: 1940-10-15 Today's Date: 11/04/2019    History of Present Illness Pt is POD1 s/p R TKA    PT Comments    Pt was long sitting in bed upon arriving. He is A and O x 4 and agreeable to PT session. BP 105/61. No symptoms of orthostatic throughout. He reports 8/10 pain but tolerated session well without increase in pain. RN arrived during session to issue morning medications. Pt was able to exit L side of bed with increased time + mod-max assist +1 and vcs throughout for technique. Sat EOB x several minutes prior to standing and and ambulating to recliner. Gait belt used for safety + mod-max assist. Required elevated bed height to achieve standing. Poor standing posture with heavy reliance of RW for balance. Ambulated 3 ft to recliner with very slow antalgic gait pattern. Gait distances limited by pain. He was repositioned in recliner at conclusion of session, with chair alarm in place,call bell in reach, polar care applied, and towel roll under RLE to promote extension. PT continues to recommend DC to SNF to progress strength, ROM, and safe functional mobility. Will return this afternoon for PM session.     Follow Up Recommendations  SNF     Equipment Recommendations  None recommended by PT    Recommendations for Other Services       Precautions / Restrictions Precautions Precautions: Fall;Knee Precaution Booklet Issued: Yes (comment) Restrictions Weight Bearing Restrictions: Yes RLE Weight Bearing: Weight bearing as tolerated    Mobility  Bed Mobility Overal bed mobility: Needs Assistance Bed Mobility: Supine to Sit     Supine to sit: HOB elevated;Mod assist;Max assist     General bed mobility comments: Pt required Mod-max assist to progress from long sitting to short sit EOB. was able to progress RLE but required assistance with upper body progression. required use of drawsheet to achieve EOB  short sit with BLEs supoorted on floor.  Transfers Overall transfer level: Needs assistance Equipment used: Rolling walker (2 wheeled) Transfers: Sit to/from Stand Sit to Stand: Mod assist;Max assist;From elevated surface         General transfer comment: Pt required mod-max of one to stand from elevated bed height. Vcs for technique, improved fwd wt shift, and hand placement.   Ambulation/Gait Ambulation/Gait assistance: Min guard Gait Distance (Feet): 3 Feet Assistive device: Rolling walker (2 wheeled) Gait Pattern/deviations: Step-to pattern;Trunk flexed;Antalgic Gait velocity: decreaesd   General Gait Details: Gait distances limited by pain this session. Will progress as able in PM session.   Stairs             Wheelchair Mobility    Modified Rankin (Stroke Patients Only)       Balance Overall balance assessment: Needs assistance Sitting-balance support: Feet supported;Bilateral upper extremity supported Sitting balance-Leahy Scale: Good Sitting balance - Comments: no LOB in sitting with all extremity support   Standing balance support: Bilateral upper extremity supported Standing balance-Leahy Scale: Good Standing balance comment: heavy support on RW to maintain balance in standing however no LOB noted                            Cognition Arousal/Alertness: Awake/alert Behavior During Therapy: WFL for tasks assessed/performed Overall Cognitive Status: Within Functional Limits for tasks assessed  General Comments: Pt is A and O x 4 and agreeable to PT session      Exercises      General Comments        Pertinent Vitals/Pain Pain Assessment: 0-10 Pain Score: 8  Pain Location: R knee Pain Descriptors / Indicators: Operative site guarding;Aching Pain Intervention(s): Limited activity within patient's tolerance;Monitored during session;Premedicated before session;Ice applied    Home Living                       Prior Function            PT Goals (current goals can now be found in the care plan section) Acute Rehab PT Goals Patient Stated Goal: " Go to rehab and get stronger before going home" Progress towards PT goals: Progressing toward goals    Frequency    BID      PT Plan Current plan remains appropriate    Co-evaluation              AM-PAC PT "6 Clicks" Mobility   Outcome Measure  Help needed turning from your back to your side while in a flat bed without using bedrails?: A Lot Help needed moving from lying on your back to sitting on the side of a flat bed without using bedrails?: A Lot Help needed moving to and from a bed to a chair (including a wheelchair)?: A Lot Help needed standing up from a chair using your arms (e.g., wheelchair or bedside chair)?: A Lot Help needed to walk in hospital room?: A Little Help needed climbing 3-5 steps with a railing? : A Lot 6 Click Score: 13    End of Session Equipment Utilized During Treatment: Gait belt Activity Tolerance: Patient tolerated treatment well Patient left: in chair;with chair alarm set;with SCD's reapplied Nurse Communication: Mobility status PT Visit Diagnosis: Muscle weakness (generalized) (M62.81);History of falling (Z91.81);Difficulty in walking, not elsewhere classified (R26.2);Pain Pain - Right/Left: Right Pain - part of body: Knee     Time: 0920-0949 PT Time Calculation (min) (ACUTE ONLY): 29 min  Charges:  $Therapeutic Activity: 23-37 mins                     Julaine Fusi PTA 11/04/19, 10:06 AM

## 2019-11-04 NOTE — TOC Progression Note (Signed)
Transition of Care (TOC) - Progression Note    Patient Details  Name: Deron Poole. MRN: [id number] Date of Birth: Dec 17, 1940  Transition of Care [facility]) CM/SW Contact  Su Hilt, RN Phone Number: 11/04/2019, 2:14 PM  Clinical Narrative:     Met with the patient and the family in the room to review the bed offers, The daughter stated they do not want to go outside of [place] except they will go to [facility], She does not want the patient to go to [facility], she asked that I contact [facility] and [facility] to see if they will offer a bed, I reach out to Tammy at [facility] asking if they would take a look to maybe offer a bed, Awaiting bed offers, I explained that we still need to get insurance auth as well and that can take a bit of time so we need to choose a bed sooner verses later, I explained if [facility] and [facility] both did not have a bed to offer they would have to come up with a back up plan for DC and need to arrange to take the patient home with [facility]. She stated understanding        Expected Discharge Plan and Services                                                 Social Determinants of Health (SDOH) Interventions    Readmission Risk Interventions No flowsheet data found.

## 2019-11-04 NOTE — TOC Progression Note (Signed)
Transition of Care (TOC) - Progression Note    Patient Details  Name: Justin Shannon. MRN: [id number] Date of Birth: [birth year]  Transition of Care [facility]) CM/SW Contact  Su Hilt, RN Phone Number: 11/04/2019, 11:49 AM  Clinical Narrative:    Patient  Did not do well with PT, PASSR done, Bedsearch sent and FL2 complete,  Will review bed offers once obtained        Expected Discharge Plan and Services                                                 Social Determinants of Health (SDOH) Interventions    Readmission Risk Interventions No flowsheet data found.

## 2019-11-04 NOTE — NC FL2 (Signed)
[place] LEVEL OF CARE SCREENING TOOL     IDENTIFICATION  Patient Name: Justin Shannon. Birthdate: 03/10/1941 Sex: male Admission Date (Current Location): 11/02/2019  [place] and [place] Number:  Engineering geologist and Address:  [facility], [address] [place], [place] [address]      Provider Number: [id number]  Attending Physician Name and Address:  Dereck Leep, MD  Relative Name and Phone Number:  [name] daughter [phone]    Current Level of Care: SNF Recommended Level of Care: [place] Prior Approval Number:    Date Approved/Denied:   PASRR Number: [id number]  Discharge Plan: SNF    Current Diagnoses: Patient Active Problem List   Diagnosis Date Noted  . Total knee replacement status 11/02/2019  . PAD (peripheral artery disease) ([place]) 10/13/2019  . Hyperkalemia   . Acute kidney injury superimposed on CKD ([place])   . Anemia of chronic renal failure, stage 3a   . Benign prostatic hyperplasia   . Seizure ([place])   . Pneumonia due to COVID-19 virus 06/24/2019  . Respiratory failure with hypoxia ([place]) 06/24/2019  . Anemia due to stage 4 chronic kidney disease ([place]) 06/24/2019  . Essential hypertension 06/24/2019  . Generalized weakness 06/24/2019  . Primary osteoarthritis of right knee 04/03/2019  . Venous ulcer of ankle, right ([place]) 05/08/2018  . Use of cane as ambulatory aid 04/27/2018  . Type 2 diabetes mellitus with stage 4 chronic kidney disease, without long-term current use of insulin ([place]) 06/11/2017  . Lymphedema 05/04/2016  . Chronic venous insufficiency 05/04/2016  . Toe sprain 01/24/2016  . Health care maintenance 12/11/2014  . Morbid (severe) obesity due to excess calories ([place]) 05/24/2014  . Morbid obesity with body mass index of 40.0-44.9 in adult [facility]) 05/24/2014  . OSA on CPAP 01/10/2014  . Chronic kidney disease (CKD), stage III (moderate) 11/22/2013  . Allergic rhinitis  11/06/2013  . Benign hypertension with chronic kidney disease, stage III ([place]) 11/06/2013  . Epilepsy ([place]) 11/06/2013  . Gout 11/06/2013  . History of DVT (deep vein thrombosis) 11/06/2013  . 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) 11/06/2013    Orientation RESPIRATION BLADDER Height & Weight     Self, Time, Situation, Place  Normal Continent Weight: 125.1 kg Height:  5\' 11"  (180.3 cm)  BEHAVIORAL SYMPTOMS/MOOD NEUROLOGICAL BOWEL NUTRITION STATUS      Continent Diet(Carb Modified)  AMBULATORY STATUS COMMUNICATION OF NEEDS Skin   Extensive Assist Verbally Surgical wounds                       Personal Care Assistance Level of Assistance              Functional Limitations Info             SPECIAL CARE FACTORS FREQUENCY  PT (By licensed PT)     PT Frequency: 5 times per week              Contractures Contractures Info: Not present    Additional Factors Info  Allergies, Code Status Code Status Info: full code Allergies Info: Shellfish Allergy, Iodinated Diagnostic Agents, Other           Current Medications (11/04/2019):  This is the current hospital active medication list Current Facility-Administered Medications  Medication Dose Route Frequency Provider Last Rate Last Admin  . 0.9 %  sodium chloride infusion  Intravenous Continuous Hooten, Laurice Record, MD   Stopped at 11/03/19 1718  . acetaminophen (TYLENOL) tablet 325-650 mg  325-650 mg Oral Q6H PRN Hooten, Laurice Record, MD      . allopurinol (ZYLOPRIM) tablet 100 mg  100 mg Oral Daily Hooten, Laurice Record, MD   100 mg at 11/04/19 0928  . alum & mag hydroxide-simeth (MAALOX/MYLANTA) 200-200-20 MG/5ML suspension 30 mL  30 mL Oral Q4H PRN Hooten, Laurice Record, MD      . ascorbic acid (VITAMIN C) tablet 500 mg  500 mg Oral Daily Hooten, Laurice Record, MD   500 mg at 11/04/19 0928  . bisacodyl (DULCOLAX) suppository 10 mg  10 mg Rectal Daily PRN  Hooten, Laurice Record, MD      . diltiazem (CARDIZEM CD) 24 hr capsule 240 mg  240 mg Oral Daily Hooten, Laurice Record, MD   240 mg at 11/04/19 0928  . diphenhydrAMINE (BENADRYL) 12.5 MG/5ML elixir 12.5-25 mg  12.5-25 mg Oral Q4H PRN Hooten, Laurice Record, MD      . ferrous sulfate tablet 325 mg  325 mg Oral BID WC Hooten, Laurice Record, MD   325 mg at 11/04/19 0928  . fluticasone (FLONASE) 50 MCG/ACT nasal spray 2 spray  2 spray Each Nare Daily PRN Hooten, Laurice Record, MD      . gabapentin (NEURONTIN) capsule 300 mg  300 mg Oral QHS Hooten, Laurice Record, MD   300 mg at 11/03/19 2142  . HYDROmorphone (DILAUDID) injection 0.5-1 mg  0.5-1 mg Intravenous Q4H PRN Hooten, Laurice Record, MD      . insulin aspart (novoLOG) injection 0-15 Units  0-15 Units Subcutaneous TID WC Hooten, Laurice Record, MD   2 Units at 11/03/19 1224  . levETIRAcetam (KEPPRA) tablet 500 mg  500 mg Oral BID Dereck Leep, MD   500 mg at 11/04/19 0928  . loratadine (CLARITIN) tablet 10 mg  10 mg Oral Daily Hooten, Laurice Record, MD   10 mg at 11/03/19 0831  . magnesium hydroxide (MILK OF MAGNESIA) suspension 30 mL  30 mL Oral Daily Hooten, Laurice Record, MD   30 mL at 11/04/19 0928  . menthol-cetylpyridinium (CEPACOL) lozenge 3 mg  1 lozenge Oral PRN Hooten, Laurice Record, MD       Or  . phenol (CHLORASEPTIC) mouth spray 1 spray  1 spray Mouth/Throat PRN Hooten, Laurice Record, MD      . metoCLOPramide (REGLAN) tablet 5-10 mg  5-10 mg Oral Q8H PRN Hooten, Laurice Record, MD       Or  . metoCLOPramide (REGLAN) injection 5-10 mg  5-10 mg Intravenous Q8H PRN Hooten, Laurice Record, MD      . metoCLOPramide (REGLAN) tablet 10 mg  10 mg Oral TID AC & HS Hooten, Laurice Record, MD   10 mg at 11/04/19 0927  . metoprolol succinate (TOPROL-XL) 24 hr tablet 50 mg  50 mg Oral Daily Hooten, Laurice Record, MD   50 mg at 11/04/19 0928  . multivitamin with minerals tablet 1 tablet  1 tablet Oral Daily Hooten, Laurice Record, MD   1 tablet at 11/04/19 0927  . ondansetron (ZOFRAN) tablet 4 mg  4 mg Oral Q6H PRN Hooten, Laurice Record, MD       Or  .  ondansetron (ZOFRAN) injection 4 mg  4 mg Intravenous Q6H PRN Hooten, Laurice Record, MD      . oxyCODONE (Oxy IR/ROXICODONE) immediate release tablet 10 mg  10 mg Oral Q4H PRN Hooten, Laurice Record, MD      .  oxyCODONE (Oxy IR/ROXICODONE) immediate release tablet 5 mg  5 mg Oral Q4H PRN Hooten, Laurice Record, MD   5 mg at 11/02/19 1832  . pantoprazole (PROTONIX) EC tablet 40 mg  40 mg Oral BID Dereck Leep, MD   40 mg at 11/04/19 0928  . rivaroxaban (XARELTO) tablet 20 mg  20 mg Oral Daily Hooten, Laurice Record, MD   20 mg at 11/03/19 0836  . senna-docusate (Senokot-S) tablet 1 tablet  1 tablet Oral BID Dereck Leep, MD   1 tablet at 11/04/19 0928  . sodium phosphate (FLEET) 7-19 GM/118ML enema 1 enema  1 enema Rectal Once PRN Hooten, Laurice Record, MD      . torsemide (DEMADEX) tablet 20 mg  20 mg Oral Daily Hooten, Laurice Record, MD   20 mg at 11/04/19 0928  . traMADol (ULTRAM) tablet 50-100 mg  50-100 mg Oral Q4H PRN Hooten, Laurice Record, MD         Discharge Medications: Please see discharge summary for a list of discharge medications.  Relevant Imaging Results:  Relevant Lab Results:   Additional Information [id number]  Su Hilt, RN

## 2019-11-04 NOTE — Care Management Important Message (Signed)
Important Message  Patient Details  Name: Emmitte Surgeon. MRN: [id number] Date of Birth: 02-01-1941   Medicare Important Message Given:  N/A - LOS <3 / Initial given by admissions     Juliann Pulse A Armour Villanueva 11/04/2019, 7:53 AM

## 2019-11-04 NOTE — Progress Notes (Addendum)
  Subjective: 2 Days Post-Op Procedure(s) (LRB): COMPUTER ASSISTED TOTAL KNEE ARTHROPLASTY (Right) APPLICATION OF WOUND VAC (Right) Patient reports pain as well-controlled.   Patient is well, and has had no acute complaints or problems Plan is to go Home after hospital stay if able. Will depend on patient's PT progress. Negative for chest pain and shortness of breath Fever: no Gastrointestinal: negative for nausea and vomiting.   Patient has not had a bowel movement.  Objective: Vital signs in last 24 hours: Temp:  [97.5 F (36.4 C)-98 F (36.7 C)] 98 F (36.7 C) (05/14 0811) Pulse Rate:  [50-97] 62 (05/14 0811) Resp:  [16-20] 16 (05/14 0811) BP: (106-142)/(53-111) 106/53 (05/14 0811) SpO2:  [100 %] 100 % (05/14 0811)  Intake/Output from previous day:  Intake/Output Summary (Last 24 hours) at 11/04/2019 0818 Last data filed at 11/04/2019 6803 Gross per 24 hour  Intake 240 ml  Output 1150 ml  Net -910 ml    Intake/Output this shift: No intake/output data recorded.  Labs: No results for input(s): HGB in the last 72 hours. No results for input(s): WBC, RBC, HCT, PLT in the last 72 hours. No results for input(s): NA, K, CL, CO2, BUN, CREATININE, GLUCOSE, CALCIUM in the last 72 hours. No results for input(s): LABPT, INR in the last 72 hours.   EXAM General - Patient is Alert, Appropriate and Oriented Extremity - Neurovascular intact Dorsiflexion/Plantar flexion intact Compartment soft Dressing/Incision -Postoperative dressing remains in place., Polar Care in place and working, [employer] - intact, moving foot and toes well on exam.  Cardiovascular- Regular rate and rhythm, no murmurs/rubs/gallops Respiratory- Scattered wheezes heard in upper lung fields, otherwise clear to auscultation bilaterally Gastrointestinal- soft, nontender and hypoactive bowel sounds   Assessment/Plan: 2 Days Post-Op Procedure(s) (LRB): COMPUTER ASSISTED TOTAL KNEE ARTHROPLASTY  (Right) APPLICATION OF WOUND VAC (Right) Active Problems:   Total knee replacement status  Estimated body mass index is 38.47 kg/m as calculated from the following:   Height as of this encounter: 5\' 11"  (1.803 m).   Weight as of this encounter: 125.1 kg. Advance diet Up with therapy Discharge plan will be re evaluated pending physical therapy.   Post-op dressing removed. Ace bandage compression wrap applied to operative leg.   DVT Prophylaxis - Xarelto Weight-Bearing as tolerated to right leg  Justin Smiles, PA-C [facility] Orthopaedic Surgery 11/04/2019, 8:18 AM

## 2019-11-04 NOTE — Progress Notes (Signed)
Physical Therapy Treatment Patient Details Name: Justin Shannon. MRN: [id number] DOB: 1941-03-03 Today's Date: 11/04/2019    History of Present Illness Pt is POD1 s/p R TKA    PT Comments    Pt was seated in recliner upon arriving with daughter and spouse at bedside. He agrees to PT session and is cooperative throughout. Reports LUE pain that he has had PTA. Pt required +2 assistance this afternoon to stand and ambulate. He stood and tolerated ambulation 20 ft however was limited by fatigue . SOB noted with slight wheezing. Pain 8/10 in wt bearing. He has poor gait posture and difficulty progressing LLE with RLE wt bearing. Pt did better previous date with ambulation. More pain and fatigue overall this date. He was repositioned in bed at conclusion of session with call bell in reach, polar care reapplied, towel roll placed under R heel, bed alarm set and RN informed of pt's daughter asking about breathing treatment. Unable to measure knee ROM 2/2 to pain/fatigue with minimal functional activity. Therapist will return next date with focus on strengthening exercises and increasing ROM. He will benefit from SNF at DC to address ROM, strength, and safe functional mobility deficits.     Follow Up Recommendations  SNF     Equipment Recommendations  None recommended by PT    Recommendations for Other Services       Precautions / Restrictions Precautions Precautions: Fall;Knee Precaution Booklet Issued: Yes (comment) Restrictions Weight Bearing Restrictions: Yes RLE Weight Bearing: Weight bearing as tolerated    Mobility  Bed Mobility Overal bed mobility: Needs Assistance Bed Mobility: Sit to Supine       Sit to supine: +2 for safety/equipment;+2 for physical assistance;HOB elevated;Max assist(mod +2)   General bed mobility comments: Pt required max assist +2 to safely transition from sitting EOB to supine in bed.  Vcs for technique and sequencing  Transfers Overall transfer  level: Needs assistance Equipment used: Rolling walker (2 wheeled) Transfers: Sit to/from Stand Sit to Stand: Mod assist;+2 safety/equipment;+2 physical assistance;Max assist         General transfer comment: pt required +2 assistance to stand from lower recliner height. poor fwd wt shift with vcs for improved technique and sequencing.   Ambulation/Gait Ambulation/Gait assistance: Min assist;+2 physical assistance;+2 safety/equipment;Mod assist Gait Distance (Feet): 20 Feet Assistive device: Rolling walker (2 wheeled) Gait Pattern/deviations: Step-to pattern;Trunk flexed;Antalgic Gait velocity: decreased   General Gait Details: Pt require increased assistance this afternoon to ambulate ~ 20 ft with RW. +2 assist for safety. He fatigued quickly and has poor foot clearance during swing phase. decreased stance time. Pt demonstrates poor gait posture and was limited by pain/fatigue   Stairs             Wheelchair Mobility    Modified Rankin (Stroke Patients Only)       Balance Overall balance assessment: Needs assistance Sitting-balance support: Feet supported;Bilateral upper extremity supported Sitting balance-Leahy Scale: Good     Standing balance support: Bilateral upper extremity supported Standing balance-Leahy Scale: Fair Standing balance comment: heavy support on RW to maintain balance in standing however no LOB noted                            Cognition Arousal/Alertness: Awake/alert Behavior During Therapy: WFL for tasks assessed/performed Overall Cognitive Status: Within Functional Limits for tasks assessed  General Comments: Pt is A and O x 4 and agreeable to PT session      Exercises Total Joint Exercises Goniometric ROM: (unable to measure 2/2 to pain/fatigue)    General Comments        Pertinent Vitals/Pain Pain Assessment: 0-10 Pain Score: 8  Pain Location: R knee Pain Descriptors /  Indicators: Operative site guarding;Aching Pain Intervention(s): Limited activity within patient's tolerance;Monitored during session;Premedicated before session;Repositioned;Ice applied    Home Living                      Prior Function            PT Goals (current goals can now be found in the care plan section) Acute Rehab PT Goals Patient Stated Goal: get stronger and move better Progress towards PT goals: Progressing toward goals    Frequency    BID      PT Plan Current plan remains appropriate    Co-evaluation              AM-PAC PT "6 Clicks" Mobility   Outcome Measure  Help needed turning from your back to your side while in a flat bed without using bedrails?: A Lot Help needed moving from lying on your back to sitting on the side of a flat bed without using bedrails?: A Lot Help needed moving to and from a bed to a chair (including a wheelchair)?: A Lot Help needed standing up from a chair using your arms (e.g., wheelchair or bedside chair)?: A Lot Help needed to walk in hospital room?: A Lot Help needed climbing 3-5 steps with a railing? : A Lot 6 Click Score: 12    End of Session Equipment Utilized During Treatment: Gait belt Activity Tolerance: Patient limited by fatigue;Patient limited by pain Patient left: in chair;with chair alarm set;with SCD's reapplied Nurse Communication: Mobility status PT Visit Diagnosis: Muscle weakness (generalized) (M62.81);History of falling (Z91.81);Difficulty in walking, not elsewhere classified (R26.2);Pain Pain - Right/Left: Right Pain - part of body: Knee     Time: 1310-1400 PT Time Calculation (min) (ACUTE ONLY): 50 min  Charges:  $Gait Training: 23-37 mins $Therapeutic Activity: 8-22 mins                     Julaine Fusi PTA 11/04/19, 3:21 PM

## 2019-11-04 NOTE — TOC Progression Note (Signed)
Transition of Care (TOC) - Progression Note    Patient Details  Name: Justin Shannon. MRN: [id number] Date of Birth: 01-08-1941  Transition of Care [facility]) CM/SW Contact  Su Hilt, RN Phone Number: 11/04/2019, 2:35 PM  Clinical Narrative:     Spoke with the family and the patient and reviewed that [facility] has made a bed offer with the patient able to do his own CPAP I accepted the offer thru the [facility] and called Kayla at [place] to accept the offer I called [place] to start the insurance auth process,  Ref number [id number]  I faxed the clinical information to [facility] at [phone]    Expected Discharge Plan and Services                                                 Social Determinants of Health (SDOH) Interventions    Readmission Risk Interventions No flowsheet data found.

## 2019-11-05 LAB — GLUCOSE, CAPILLARY
Glucose-Capillary: 78 mg/dL (ref 70–99)
Glucose-Capillary: 105 mg/dL — ABNORMAL HIGH (ref 70–99)
Glucose-Capillary: 95 mg/dL (ref 70–99)
Glucose-Capillary: 84 mg/dL (ref 70–99)

## 2019-11-05 MED ORDER — ALBUTEROL SULFATE (2.5 MG/3ML) 0.083% IN NEBU
2.5000 mg | INHALATION_SOLUTION | Freq: Four times a day (QID) | RESPIRATORY_TRACT | Status: DC
  Administered 2019-11-05 – 2019-11-07 (×5): 2.5 mg via RESPIRATORY_TRACT
  Filled 2019-11-05 (×8): qty 3

## 2019-11-05 MED ORDER — ALBUTEROL SULFATE (2.5 MG/3ML) 0.083% IN NEBU
2.5000 mg | INHALATION_SOLUTION | Freq: Four times a day (QID) | RESPIRATORY_TRACT | Status: DC | PRN
  Administered 2019-11-05: 2.5 mg via RESPIRATORY_TRACT
  Filled 2019-11-05: qty 3

## 2019-11-05 NOTE — Progress Notes (Signed)
Patient complained of heel pain from bone foam and foot pump on right foot wanted it removed for a period, replaced with towel for support

## 2019-11-05 NOTE — Progress Notes (Signed)
Physical Therapy Treatment Patient Details Name: Justin Shannon. MRN: [id number] DOB: 1941-04-24 Today's Date: 11/05/2019    History of Present Illness pt is S/P R TKA    PT Comments    Therapist contacted by RN staff with pt requesting to get to [facility] for BM. +2 max assist to stand from recliner to RW. Pt requires +2 assist 2/2 to posterior push and weakness. He was able to take 3 steps to turn to [facility] but poor quality noted. Fwd flexed posture, shuffling steps, and antalgic step to pattern. Pt has severe SOB and wheezing this afternoon but sao2 > 90% throughout on room air. She was able to stand from elevated BSC height with +1 max assist + max vcs after and unsuccessful attempt at having BM. He took 5 steps to return to EOB with poor quality and severe fatigue noted. Bed had to be slide behind him 2/2 to fatigue. Max assist to return to supine with therapist progressing BLEs into bed. Once in supine, pt performed there ex and tolerated well. See exercises listed below. He was supine in bed with polar care applied, towel roll under R heel to promote ext, and call bell and bed alarm in place. RT aware of breathing concerns and will be giving breathing treatment soon. Pt will benefit from SNF to address deficits with strength, ROM, and overall safe functional mobility.       Follow Up Recommendations  SNF     Equipment Recommendations  None recommended by PT    Recommendations for Other Services       Precautions / Restrictions Precautions Precautions: Fall;Knee Precaution Booklet Issued: Yes (comment) Restrictions Weight Bearing Restrictions: Yes RLE Weight Bearing: Weight bearing as tolerated    Mobility  Bed Mobility Overal bed mobility: Needs Assistance Bed Mobility: Sit to Supine     Supine to sit: HOB elevated;Max assist;+2 for safety/equipment Sit to supine: Max assist;+2 for safety/equipment;HOB elevated   General bed mobility comments: Pt required max assist of one to  progress from EOB sitting to supine. +2 assist to reposition to [facility]. pt very SOB and wheezing with limited activity  Transfers Overall transfer level: Needs assistance Equipment used: Rolling walker (2 wheeled) Transfers: Sit to/from Stand Sit to Stand: +2 physical assistance;+2 safety/equipment;Max assist;From elevated surface         General transfer comment: Pt was seated in recliner from AM session. RN requested therapist assistance with getting pt on/off BSC. +2 max assist to stand from lower recliner height. pt continues to have sever posterior push and required max vcs for fwd wt shift. He was able to take ~ 3 steps to turn to [facility] and then stand and take ~ 5 steps to turn to sit EOB. max assist throughout. pt struggles with LE adavncement and  is limited by SOB/pain.   Ambulation/Gait Ambulation/Gait assistance: Max assist;+2 safety/equipment Gait Distance (Feet): 5 Feet Assistive device: Rolling walker (2 wheeled) Gait Pattern/deviations: Step-to pattern;Antalgic;Trunk flexed;Narrow base of support;Shuffle Gait velocity: decreased   General Gait Details: pt was unable to advance LEs by clearing floor this afternoon. had to shuffle feet on floor. Pt very SOB with minimal gait distances   Stairs             Wheelchair Mobility    Modified Rankin (Stroke Patients Only)       Balance Overall balance assessment: Needs assistance Sitting-balance support: Feet supported;Bilateral upper extremity supported Sitting balance-Leahy Scale: Poor Sitting balance - Comments: pt with posterior lean  Standing balance support: Bilateral upper extremity supported Standing balance-Leahy Scale: Poor Standing balance comment: initially pt has very poor standing posture but once wt shifted fwd required only CGA. poor dynamic with max assist this afternoon when ambulating                            Cognition Arousal/Alertness: Awake/alert Behavior During Therapy: Flat  affect Overall Cognitive Status: Within Functional Limits for tasks assessed                                 General Comments: Pt is A and O x 4 and agreeable to PT session      Exercises Total Joint Exercises Ankle Circles/Pumps: AROM;10 reps;Both;Supine Quad Sets: AROM;Right;10 reps Heel Slides: AROM;Right;10 reps;Supine Hip ABduction/ADduction: AAROM;5 reps;Right Straight Leg Raises: AAROM;Right;5 reps;Supine Knee Flexion: AROM;Seated;Right;5 reps Goniometric ROM: 3-80    General Comments        Pertinent Vitals/Pain Pain Assessment: 0-10 Pain Score: 8  Pain Location: R knee Pain Descriptors / Indicators: Operative site guarding;Aching Pain Intervention(s): Limited activity within patient's tolerance;Monitored during session;Premedicated before session;Repositioned;Ice applied    Home Living                      Prior Function            PT Goals (current goals can now be found in the care plan section) Acute Rehab PT Goals Patient Stated Goal: get stronger and move better Progress towards PT goals: Progressing toward goals(slow progress 2/2 to pain/SOB)    Frequency    BID      PT Plan Current plan remains appropriate    Co-evaluation              AM-PAC PT "6 Clicks" Mobility   Outcome Measure  Help needed turning from your back to your side while in a flat bed without using bedrails?: A Lot Help needed moving from lying on your back to sitting on the side of a flat bed without using bedrails?: A Lot Help needed moving to and from a bed to a chair (including a wheelchair)?: A Lot Help needed standing up from a chair using your arms (e.g., wheelchair or bedside chair)?: A Lot Help needed to walk in hospital room?: A Lot Help needed climbing 3-5 steps with a railing? : Total 6 Click Score: 11    End of Session Equipment Utilized During Treatment: Gait belt Activity Tolerance: Patient limited by fatigue;Patient limited by  pain Patient left: in bed;with call bell/phone within reach;with bed alarm set;with family/visitor present;with SCD's reapplied Nurse Communication: Mobility status PT Visit Diagnosis: Muscle weakness (generalized) (M62.81);History of falling (Z91.81);Difficulty in walking, not elsewhere classified (R26.2);Pain Pain - Right/Left: Right Pain - part of body: Knee     Time: 1225-1253 PT Time Calculation (min) (ACUTE ONLY): 28 min  Charges:  $Therapeutic Exercise: 8-22 mins $Therapeutic Activity: 8-22 mins                     Julaine Fusi PTA 11/05/19, 1:22 PM

## 2019-11-05 NOTE — TOC Progression Note (Signed)
Transition of Care (TOC) - Progression Note    Patient Details  Name: Justin Shannon. MRN: [id number] Date of Birth: 1941-01-18  Transition of Care [facility]) CM/SW Contact  Boris Sharper, [place] Phone Number: 11/05/2019, 4:44 PM  Clinical Narrative:    [place] to check on status of authorization, still in review.  TOC will continue to follow        Expected Discharge Plan and Services                                                 Social Determinants of Health (SDOH) Interventions    Readmission Risk Interventions No flowsheet data found.

## 2019-11-05 NOTE — Progress Notes (Signed)
Physical Therapy Treatment Patient Details Name: Justin Shannon. MRN: [id number] DOB: 07-24-1940 Today's Date: 11/05/2019    History of Present Illness pt is S/P R TKA    PT Comments    Pt was long sitting in bed upon arriving.  Presents with more wheezing/ SOB but sao2 and RR stable throughout. He agrees to OOB activity. Required max assist to exit bed and stand to RW with more severe posterior push present. 2nd person close for safety. Unable to stand without bed height elevated. Max vcs for fwd wt shift. He was able to ambulate 5 ft to recliner with very slow antalgic gait sequencing. Distances limited by fatigue with SOB noted. Once seated in recliner, pt perform there ex with AROM 3-80 degrees. RN aware of breathing concerns. He was repositioned in recliner with call bell in reach, towel roll under RLE, and polar care reapplied. Therapist will return later this date to progress ambulation as able.Recommend SNF at [place] to address strength, ROM, endurance, and safe mobility deficits.   Follow Up Recommendations  SNF     Equipment Recommendations  None recommended by PT    Recommendations for Other Services       Precautions / Restrictions Precautions Precautions: Fall;Knee Precaution Booklet Issued: Yes (comment) Restrictions Weight Bearing Restrictions: Yes RLE Weight Bearing: Weight bearing as tolerated    Mobility  Bed Mobility Overal bed mobility: Needs Assistance Bed Mobility: Supine to Sit     Supine to sit: HOB elevated;Max assist;+2 for safety/equipment     General bed mobility comments: Pt required increased assistance this date to exit L side of bed. HOB elevated and heavy use of bedrail. He has severe posterior lean in sitting with max vcs for fwd wt shift. Incraesed time to perform all desired task and incraesed time to process   Transfers Overall transfer level: Needs assistance Equipment used: Rolling walker (2 wheeled) Transfers: Sit to/from Stand Sit  to Stand: +2 safety/equipment;From elevated surface;Max assist         General transfer comment: Max assist of one with 2nd person close for safety. poor ability to fwd wt shift with posterior push throughout. Once pt able to achieve  fwd wt shift he was able to stand with CGA only.   Ambulation/Gait Ambulation/Gait assistance: Min assist;+2 safety/equipment;Mod assist Gait Distance (Feet): 5 Feet Assistive device: Rolling walker (2 wheeled) Gait Pattern/deviations: Step-to pattern;Trunk flexed;Antalgic Gait velocity: decreased   General Gait Details: pt was only able to tolerate ambulation from EOB to recliner with slight SOB/wheezing present. reported increased pain    Stairs             Wheelchair Mobility    Modified Rankin (Stroke Patients Only)       Balance Overall balance assessment: Needs assistance Sitting-balance support: Feet supported;Bilateral upper extremity supported Sitting balance-Leahy Scale: Poor Sitting balance - Comments: pt has severe posterior lean this date   Standing balance support: Bilateral upper extremity supported Standing balance-Leahy Scale: Fair Standing balance comment: Once standing with BUE support, pt requires CGA but during transfers required max assist to fwd wt shift                            Cognition Arousal/Alertness: Awake/alert Behavior During Therapy: WFL for tasks assessed/performed Overall Cognitive Status: Within Functional Limits for tasks assessed  General Comments: Pt is A and O x 4 and agreeable to PT session      Exercises Total Joint Exercises Knee Flexion: AROM;Seated;Right;5 reps Goniometric ROM: 3-80 degrees    General Comments        Pertinent Vitals/Pain Pain Assessment: 0-10 Pain Score: 6  Pain Location: R knee Pain Descriptors / Indicators: Operative site guarding;Aching Pain Intervention(s): Limited activity within patient's  tolerance;Monitored during session;Premedicated before session;Repositioned;Ice applied    Home Living                      Prior Function            PT Goals (current goals can now be found in the care plan section) Acute Rehab PT Goals Patient Stated Goal: get stronger and move better Progress towards PT goals: Progressing toward goals    Frequency    BID      PT Plan Current plan remains appropriate    Co-evaluation              AM-PAC PT "6 Clicks" Mobility   Outcome Measure  Help needed turning from your back to your side while in a flat bed without using bedrails?: A Lot Help needed moving from lying on your back to sitting on the side of a flat bed without using bedrails?: A Lot Help needed moving to and from a bed to a chair (including a wheelchair)?: A Lot Help needed standing up from a chair using your arms (e.g., wheelchair or bedside chair)?: A Lot Help needed to walk in hospital room?: A Lot Help needed climbing 3-5 steps with a railing? : A Lot 6 Click Score: 12    End of Session Equipment Utilized During Treatment: Gait belt Activity Tolerance: Patient limited by fatigue;Patient limited by pain Patient left: in chair;with chair alarm set;with SCD's reapplied;with call bell/phone within reach Nurse Communication: Mobility status PT Visit Diagnosis: Muscle weakness (generalized) (M62.81);History of falling (Z91.81);Difficulty in walking, not elsewhere classified (R26.2);Pain Pain - Right/Left: Right Pain - part of body: Knee     Time: 0920-0950 PT Time Calculation (min) (ACUTE ONLY): 30 min  Charges:  $Therapeutic Exercise: 8-22 mins $Therapeutic Activity: 8-22 mins                     Julaine Fusi PTA 11/05/19, 11:37 AM

## 2019-11-05 NOTE — Progress Notes (Addendum)
  Subjective: 3 Days Post-Op Procedure(s) (LRB): COMPUTER ASSISTED TOTAL KNEE ARTHROPLASTY (Right) APPLICATION OF WOUND VAC (Right) Patient reports pain as 8 on 0-10 scale.   Patient continues to have audible wheezing and some shortness of breath. Plan is to go Skilled nursing facility after hospital stay. Negative for chest pain Fever: no Gastrointestinal: negative for nausea and vomiting.   Patient has not had a bowel movement.  Objective: Vital signs in last 24 hours: Temp:  [97.6 F (36.4 C)-98.6 F (37 C)] 97.7 F (36.5 C) (05/15 0807) Pulse Rate:  [58-70] 70 (05/15 0807) Resp:  [17-20] 20 (05/15 0807) BP: (97-115)/(52-56) 115/56 (05/15 0814) SpO2:  [94 %-100 %] 100 % (05/15 0807)  Intake/Output from previous day:  Intake/Output Summary (Last 24 hours) at 11/05/2019 1030 Last data filed at 11/05/2019 0728 Gross per 24 hour  Intake 1684.4 ml  Output 3000 ml  Net -1315.6 ml    Intake/Output this shift: Total I/O In: 1200 [I.V.:1200] Out: 200 [Urine:200]  Labs: No results for input(s): HGB in the last 72 hours. No results for input(s): WBC, RBC, HCT, PLT in the last 72 hours. No results for input(s): NA, K, CL, CO2, BUN, CREATININE, GLUCOSE, CALCIUM in the last 72 hours. No results for input(s): LABPT, INR in the last 72 hours.   EXAM General - Patient is Alert, Appropriate and Oriented Extremity - Neurovascular intact Dorsiflexion/Plantar flexion intact Compartment soft Dressing/Incision -Polar Care in place and working, Praveena in place: no drainage noted in cannister Motor Function - intact, moving foot and toes well on exam.  Cardiovascular- Regular rate and rhythm, no murmurs/rubs/gallops Respiratory- Wheezing heard bilaterally, no crackles heard Gastrointestinal- soft, nontender and active bowel sounds   Assessment/Plan: 3 Days Post-Op Procedure(s) (LRB): COMPUTER ASSISTED TOTAL KNEE ARTHROPLASTY (Right) APPLICATION OF WOUND VAC (Right) Active  Problems:   Total knee replacement status  Estimated body mass index is 38.47 kg/m as calculated from the following:   Height as of this encounter: 5\' 11"  (1.803 m).   Weight as of this encounter: 125.1 kg. Advance diet Up with therapy  Awaiting insurance approval for SNF placement. X-ray reviewed. Pulmonary toilet ordered.  DVT Prophylaxis - Xarelto, Ted hose and foot pumps Weight-Bearing as tolerated to right leg  [name], PA-C [facility]opa[facility] 11/05/2019, 10:30 AM

## 2019-11-06 LAB — CBC
HCT: 21.9 % — ABNORMAL LOW (ref 39.0–52.0)
Hemoglobin: 7.4 g/dL — ABNORMAL LOW (ref 13.0–17.0)
MCH: 23.2 pg — ABNORMAL LOW (ref 26.0–34.0)
MCHC: 33.8 g/dL (ref 30.0–36.0)
MCV: 68.7 fL — ABNORMAL LOW (ref 80.0–100.0)
Platelets: 137 10*3/uL — ABNORMAL LOW (ref 150–400)
RBC: 3.19 MIL/uL — ABNORMAL LOW (ref 4.22–5.81)
RDW: 18.6 % — ABNORMAL HIGH (ref 11.5–15.5)
WBC: 11.5 10*3/uL — ABNORMAL HIGH (ref 4.0–10.5)
nRBC: 0 % (ref 0.0–0.2)

## 2019-11-06 LAB — COMPREHENSIVE METABOLIC PANEL
ALT: <5 U/L (ref 0–44)
AST: 28 U/L (ref 15–41)
Albumin: 3.3 g/dL — ABNORMAL LOW (ref 3.5–5.0)
Alkaline Phosphatase: 57 U/L (ref 38–126)
Anion gap: 8 (ref 5–15)
BUN: 75 mg/dL — ABNORMAL HIGH (ref 8–23)
CO2: 24 mmol/L (ref 22–32)
Calcium: 9.1 mg/dL (ref 8.9–10.3)
Chloride: 109 mmol/L (ref 98–111)
Creatinine, Ser: 2.72 mg/dL — ABNORMAL HIGH (ref 0.61–1.24)
GFR calc Af Amer: 25 mL/min — ABNORMAL LOW (ref >60)
GFR calc non Af Amer: 22 mL/min — ABNORMAL LOW (ref >60)
Glucose, Bld: 105 mg/dL — ABNORMAL HIGH (ref 70–99)
Potassium: 4.8 mmol/L (ref 3.5–5.1)
Sodium: 141 mmol/L (ref 135–145)
Total Bilirubin: 0.7 mg/dL (ref 0.3–1.2)
Total Protein: 6.4 g/dL — ABNORMAL LOW (ref 6.5–8.1)

## 2019-11-06 LAB — GLUCOSE, CAPILLARY
Glucose-Capillary: 89 mg/dL (ref 70–99)
Glucose-Capillary: 146 mg/dL — ABNORMAL HIGH (ref 70–99)
Glucose-Capillary: 125 mg/dL — ABNORMAL HIGH (ref 70–99)
Glucose-Capillary: 103 mg/dL — ABNORMAL HIGH (ref 70–99)

## 2019-11-06 NOTE — Progress Notes (Signed)
Patient refused tx. Patient was asleep upon by entrance. Awakes easily when name is called. Oriented to time and place. On room air in no distress however, I did remind him of cpap as I did around 11pm.  He said he would put it on and his breathing is fine at the moment.

## 2019-11-06 NOTE — Progress Notes (Signed)
Physical Therapy Treatment Patient Details Name: Justin Shannon. MRN: [id number] DOB: 03/08/1941 Today's Date: 11/06/2019    History of Present Illness 79 y/o male s/p R TKA 11/02/19.    PT Comments    Pt very much struggled with all functional tasks today.  He needed extremely heavy assist to get into bed (unable to assist with control of trunk, lifting of either leg).    He did not seem to be nearly as pain limited as previous sessions, but continues to struggle with all aspects of mobility.  He was able to maintain standing with walker to use the urinal (PT holding urinal and giving min assist to keep weight forward), pt holding walker but in reality was unsteady and unsure during this and when done could not manage even a single step (forward or sideways) and only barely was able to heel-toe shuffle feet minimally despite heavy assist (and verbal cuing for walker use) to unweight and attempt at least some LE movement.  Though limited, pt had been able to at least do some stepping on most previous PT session, not so today.  Pt very weak and functionally limited but oddly does not seem to realize that he is struggling and/or not improving as expected.    Follow Up Recommendations  SNF     Equipment Recommendations  None recommended by PT    Recommendations for Other Services       Precautions / Restrictions Precautions Precautions: Fall;Knee Precaution Booklet Issued: Yes (comment) Restrictions RLE Weight Bearing: Weight bearing as tolerated    Mobility  Bed Mobility Overal bed mobility: Needs Assistance Bed Mobility: Sit to Supine       Sit to supine: Max assist(pt able to give little to no effort with getting back to bed)      Transfers Overall transfer level: Needs assistance Equipment used: Rolling walker (2 wheeled) Transfers: Sit to/from Stand Sit to Stand: Max assist;From elevated surface         General transfer comment: Pt struggled with getting to  standing.  Gave him 2 tries at rising with lighter assist from slightly raised bed height, unsuccessfully.  Raised bed further and gave max assist and it was still a struggle to get to and maintain standing.  Pt with poor awareness of UE use (repeatedly leaning back and reaching for bed rail despite heavy cuing to keep weight forward on walker)    Ambulation/Gait             General Gait Details: only a modicum of intermittent heel-toe shuffling in walker at EOB (plenty of cuing and assist to [facility] and insure weight forward on the walker).  Unable to clear either foot (both side step and forward attempts), apparently good effort from pt but even with extra time and cuing no functional movement/mobiltiy in standing.   Stairs             Wheelchair Mobility    Modified Rankin (Stroke Patients Only)       Balance   Sitting-balance support: Feet supported;Bilateral upper extremity supported Sitting balance-Leahy Scale: Good Sitting balance - Comments: Pt able to maintain sitting at EOB w/o assist, poor tolerance to scooting/weight shifting   Standing balance support: Bilateral upper extremity supported Standing balance-Leahy Scale: Poor Standing balance comment: Static standing to use urinal (PT assisting, pt with b/l UEs on RW) required constant CGA and typically at least some min assist to keep from leaning/falling back  Cognition Arousal/Alertness: Awake/alert Behavior During Therapy: Flat affect Overall Cognitive Status: Within Functional Limits for tasks assessed                                        Exercises Total Joint Exercises Ankle Circles/Pumps: 15 reps;Strengthening Quad Sets: Strengthening;15 reps(struggles to attain TKA, poor apparently quad control) Short Arc Quad: AAROM;15 reps;Strengthening(able to initiate movement, AAROM for TKE/full range) Heel Slides: AAROM;10 reps(minimal c/o increased pain.  resisted leg extensions) Hip ABduction/ADduction: 10 reps;Strengthening;AROM Straight Leg Raises: AAROM;10 reps("That leg feels like a log."  Unable to lift w/o assist) Knee Flexion: PROM;5 reps Goniometric ROM: 1-86    General Comments        Pertinent Vitals/Pain Pain Assessment: 0-10 Pain Score: 4     Home Living                      Prior Function            PT Goals (current goals can now be found in the care plan section) Progress towards PT goals: Not progressing toward goals - comment(Pt very weak and limited this date, unable to ambulate)    Frequency    BID      PT Plan Current plan remains appropriate    Co-evaluation              AM-PAC PT "6 Clicks" Mobility   Outcome Measure  Help needed turning from your back to your side while in a flat bed without using bedrails?: A Lot Help needed moving from lying on your back to sitting on the side of a flat bed without using bedrails?: Total Help needed moving to and from a bed to a chair (including a wheelchair)?: Total Help needed standing up from a chair using your arms (e.g., wheelchair or bedside chair)?: A Lot Help needed to walk in hospital room?: Total Help needed climbing 3-5 steps with a railing? : Total 6 Click Score: 8    End of Session Equipment Utilized During Treatment: Gait belt Activity Tolerance: Patient limited by fatigue Patient left: in bed;with call bell/phone within reach;with bed alarm set;with family/visitor present;with SCD's reapplied Nurse Communication: Mobility status PT Visit Diagnosis: Muscle weakness (generalized) (M62.81);History of falling (Z91.81);Difficulty in walking, not elsewhere classified (R26.2);Pain Pain - Right/Left: Right Pain - part of body: Knee     Time: 0951-1020 PT Time Calculation (min) (ACUTE ONLY): 29 min  Charges:  $Therapeutic Exercise: 8-22 mins $Therapeutic Activity: 8-22 mins                     Kreg Shropshire,  DPT 11/06/2019, 1:05 PM

## 2019-11-06 NOTE — Progress Notes (Addendum)
  Subjective: 4 Days Post-Op Procedure(s) (LRB): COMPUTER ASSISTED TOTAL KNEE ARTHROPLASTY (Right) APPLICATION OF WOUND VAC (Right) Patient reports pain as minimal in bed but significant when weightbearing. Patient is well, and has had no acute complaints or problems Plan is to go Skilled nursing facility after hospital stay. Negative for chest pain and shortness of breath Fever: no Gastrointestinal: Reports some mild nausea this AM, but no vomiting. No symptoms at this time.  Patient states he had a large bowel movement this AM after being given a suppository.  Objective: Vital signs in last 24 hours: Temp:  [98.1 F (36.7 C)-98.3 F (36.8 C)] 98.1 F (36.7 C) (05/16 0732) Pulse Rate:  [79-91] 91 (05/16 0732) Resp:  [16-18] 17 (05/16 0732) BP: (107-149)/(32-62) 144/62 (05/16 1027) SpO2:  [100 %] 100 % (05/16 0733)  Intake/Output from previous day:  Intake/Output Summary (Last 24 hours) at 11/06/2019 1122 Last data filed at 11/06/2019 0650 Gross per 24 hour  Intake 482 ml  Output 2765 ml  Net -2283 ml    Intake/Output this shift: No intake/output data recorded.  Labs: Recent Labs    11/06/19 0420  HGB 7.4*   Recent Labs    11/06/19 0420  WBC 11.5*  RBC 3.19*  HCT 21.9*  PLT 137*   Recent Labs    11/06/19 0420  NA 141  K 4.8  CL 109  CO2 24  BUN 75*  CREATININE 2.72*  GLUCOSE 105*  CALCIUM 9.1   No results for input(s): LABPT, INR in the last 72 hours.   EXAM General - Patient is Alert, Appropriate and Oriented Extremity - Neurovascular intact Dorsiflexion/Plantar flexion intact Compartment soft Dressing/Incision -Polar Care in place and working, ACE compression wrap in place.  Motor Function - intact, moving foot and toes well on exam.  Cardiovascular- Regular rate and rhythm, no murmurs/rubs/gallops Respiratory- Lungs clear to auscultation bilaterally Gastrointestinal- soft and nontender   Assessment/Plan: 4 Days Post-Op Procedure(s)  (LRB): COMPUTER ASSISTED TOTAL KNEE ARTHROPLASTY (Right) APPLICATION OF WOUND VAC (Right) Active Problems:   Total knee replacement status  Estimated body mass index is 38.47 kg/m as calculated from the following:   Height as of this encounter: 5\' 11"  (1.803 m).   Weight as of this encounter: 125.1 kg. Advance diet Up with therapy    DVT Prophylaxis - Xarelto, Ted hose and foot pumps Weight-Bearing as tolerated to right leg  [name], PA-C [facility] Orthopaedic Surgery 11/06/2019, 11:22 AM   ADDENDUM: Secondary diagnosis: Acute blood loss anemia superimposed on chronic anemia.  Briceida Rasberry P. Holley Bouche M.D.

## 2019-11-07 LAB — GLUCOSE, CAPILLARY
Glucose-Capillary: 94 mg/dL (ref 70–99)
Glucose-Capillary: 121 mg/dL — ABNORMAL HIGH (ref 70–99)
Glucose-Capillary: 125 mg/dL — ABNORMAL HIGH (ref 70–99)
Glucose-Capillary: 222 mg/dL — ABNORMAL HIGH (ref 70–99)

## 2019-11-07 LAB — SARS CORONAVIRUS 2 BY RT PCR (HOSPITAL ORDER, PERFORMED IN ~~LOC~~ HOSPITAL LAB): SARS Coronavirus 2: NEGATIVE

## 2019-11-07 MED ORDER — OXYCODONE HCL 5 MG PO TABS: 5.0000 mg | ORAL_TABLET | ORAL | 0 refills | Status: DC | PRN

## 2019-11-07 MED ORDER — TRAMADOL HCL 50 MG PO TABS: 50.0000 mg | ORAL_TABLET | ORAL | 0 refills | Status: DC | PRN

## 2019-11-07 NOTE — Progress Notes (Signed)
Physical Therapy Treatment Patient Details Name: Justin Shannon. MRN: [id number] DOB: 04-07-1941 Today's Date: 11/07/2019    History of Present Illness 79 y/o male s/p R TKA 11/02/19.    PT Comments    Pt is making gradual progress towards goals with ability to tolerate increased intensity of therapy this date. Able to perform multiple standing attempts with improved seated/standing balance. Standing balance activities performed and pt able to take 1 small step this date, however continues to be very weak on surgical leg. He is very motivated. Will continue to progress as able.   Follow Up Recommendations  SNF     Equipment Recommendations  None recommended by PT    Recommendations for Other Services       Precautions / Restrictions Precautions Precautions: Fall;Knee Precaution Booklet Issued: Yes (comment) Restrictions Weight Bearing Restrictions: Yes RLE Weight Bearing: Weight bearing as tolerated    Mobility  Bed Mobility Overal bed mobility: Needs Assistance Bed Mobility: Supine to Sit     Supine to sit: Max assist Sit to supine: Max assist;+2 for physical assistance   General bed mobility comments: heavy assist coming to L side of bed, however able to pull with R hand easier than exiting other side of bed. Once seated at EOB, improved tolerance for upright posture. Needs heavy +2 for positioning and transition back supine  Transfers Overall transfer level: Needs assistance Equipment used: Rolling walker (2 wheeled) Transfers: Sit to/from Stand Sit to Stand: Mod assist;+2 safety/equipment;From elevated surface         General transfer comment: Attempted to stand with +1, however unable. +2 called and was able to perform standing with mod assist +2. Heavy cues for positioning of feet prior to standing. Once standing, able to maintain standing balance easier than AM session. Noted BM in standing, CNA in room to address hygiene while in standing  position  Ambulation/Gait Ambulation/Gait assistance: Max assist;+2 safety/equipment Gait Distance (Feet): 1 Feet Assistive device: Rolling walker (2 wheeled) Gait Pattern/deviations: Step-to pattern;Antalgic;Trunk flexed;Narrow base of support;Shuffle     General Gait Details: able to take 1 side step up towards [place]. Pt with loses balance with controlled descent back to sitting.   Stairs             Wheelchair Mobility    Modified Rankin (Stroke Patients Only)       Balance Overall balance assessment: Needs assistance Sitting-balance support: Feet supported;No upper extremity supported Sitting balance-Leahy Scale: Fair Sitting balance - Comments: post leaning Postural control: Posterior lean Standing balance support: Bilateral upper extremity supported Standing balance-Leahy Scale: Fair                              Cognition Arousal/Alertness: Awake/alert Behavior During Therapy: WFL for tasks assessed/performed Overall Cognitive Status: Within Functional Limits for tasks assessed                                        Exercises Total Joint Exercises Goniometric ROM: R knee flexion 65 degrees Other Exercises Other Exercises: Supine/seated ther-ex performed on R LE including AP, quad sets, SLRs, hip abd/add, abdominal sit ups-using B railing, and seated LAQ. All ther-ex performed x 15 reps with cga Other Exercises: Once seated at EOB, worked on reaching activities in all directions outside of BOS. Needs consistent cues for anterior trunkal weight shift. Other Exercises:  standing pre gait activities performed including weight shifting on B LEs, alternating arm lifts off RW and heel raises.    General Comments        Pertinent Vitals/Pain Pain Assessment: 0-10 Pain Score: 5  Pain Location: R knee Pain Descriptors / Indicators: Operative site guarding;Aching;Discomfort Pain Intervention(s): Limited activity within patient's  tolerance;Ice applied    Home Living                      Prior Function            PT Goals (current goals can now be found in the care plan section) Acute Rehab PT Goals Patient Stated Goal: To get stronger and be able to do more PT Goal Formulation: With patient Time For Goal Achievement: 11/17/19 Potential to Achieve Goals: Good Progress towards PT goals: Progressing toward goals    Frequency    BID      PT Plan Current plan remains appropriate    Co-evaluation              AM-PAC PT "6 Clicks" Mobility   Outcome Measure  Help needed turning from your back to your side while in a flat bed without using bedrails?: A Lot Help needed moving from lying on your back to sitting on the side of a flat bed without using bedrails?: A Lot Help needed moving to and from a bed to a chair (including a wheelchair)?: A Lot Help needed standing up from a chair using your arms (e.g., wheelchair or bedside chair)?: A Lot Help needed to walk in hospital room?: Total Help needed climbing 3-5 steps with a railing? : Total 6 Click Score: 10    End of Session Equipment Utilized During Treatment: Gait belt Activity Tolerance: Patient tolerated treatment well Patient left: in bed;with call bell/phone within reach;with bed alarm set;with family/visitor present;with SCD's reapplied Nurse Communication: Mobility status PT Visit Diagnosis: Muscle weakness (generalized) (M62.81);History of falling (Z91.81);Difficulty in walking, not elsewhere classified (R26.2);Pain Pain - Right/Left: Right Pain - part of body: Knee     Time: 9030-0923 PT Time Calculation (min) (ACUTE ONLY): 38 min  Charges:  $Therapeutic Exercise: 23-37 mins $Therapeutic Activity: 8-22 mins                     Greggory Stallion, PT, DPT [phone]    Justin Shannon 11/07/2019, 4:24 PM

## 2019-11-07 NOTE — Progress Notes (Signed)
Physical Therapy Treatment Patient Details Name: Justin Shannon. MRN: [id number] DOB: 26-Feb-1941 Today's Date: 11/07/2019    History of Present Illness 79 y/o male s/p R TKA 11/02/19.    PT Comments    Pt is making gradual progress towards goals, however demonstrates increased weakness on surgical leg this date, unable to fully perform SLR without min assist. Unable to fully accept WB during weight shifting despite cues for use of B UE assist. Good endurance with there-ex. Several sit<>stands performed, unable to ambulate this date due to safety. Will continue to progress as able.   Follow Up Recommendations  SNF     Equipment Recommendations  None recommended by PT    Recommendations for Other Services       Precautions / Restrictions Precautions Precautions: Fall;Knee Precaution Booklet Issued: Yes (comment) Required Braces or Orthoses: (Bone Foam) Restrictions Weight Bearing Restrictions: Yes RLE Weight Bearing: Weight bearing as tolerated    Mobility  Bed Mobility Overal bed mobility: Needs Assistance Bed Mobility: Supine to Sit     Supine to sit: Max assist Sit to supine: Max assist;+2 for physical assistance   General bed mobility comments: needs heavy assist for B LE and trunk support. Once seated, demonstrates heavy post leaning, needing cues for upright posture. Tends to lose balance towards R side and scoots out closer to EOB despite cues. On return to bed, needed max assist +2 and assist for scooting up towards [place].  Transfers Overall transfer level: Needs assistance Equipment used: Rolling walker (2 wheeled) Transfers: Sit to/from Stand Sit to Stand: Max assist;+2 physical assistance         General transfer comment: able to stand with max assist +2, however has difficulty maintaining balance with post lean noted. Attempted pre gait activities including weight shifting, however unable to accept weight on surgical LE despite cues for UE WBing. Only able  to maintain standing for brief period prior to needing rest break. 2 standing attempts noted  Ambulation/Gait             General Gait Details: unable to tolerate at this time   Stairs             Wheelchair Mobility    Modified Rankin (Stroke Patients Only)       Balance Overall balance assessment: Needs assistance Sitting-balance support: Feet supported;No upper extremity supported Sitting balance-Leahy Scale: Fair Sitting balance - Comments: post leaning Postural control: Posterior lean(noted limited DF/PF of B ankles) Standing balance support: Bilateral upper extremity supported Standing balance-Leahy Scale: Poor Standing balance comment: Pt required Min A +2 while using RW for maintaining standing balance. Required some assist to correct for leaning/unsteadiness.                            Cognition Arousal/Alertness: Awake/alert Behavior During Therapy: WFL for tasks assessed/performed Overall Cognitive Status: Within Functional Limits for tasks assessed                                 General Comments: At start of session, pt was emotional over a dream that he had, but calmed with time and active listening. Pt verbalized being nervous during the session, with hand tremors noted. He mentioned his faith several times, saying "but I'm not worried, because God will carry me through"      Exercises Total Joint Exercises Goniometric ROM: R knee 0 degrees  extension; flexion measured in PM Other Exercises Other Exercises: supine ther-ex performed on R LE including AP, quad sets, SLRs, hip abd/add, and SAQ. All ther-ex performed x 15 reps with min assist. Other Exercises: Pt educated on body mechanics and positioning for functional mobility and facilitating ADL engagement. Pt was receptive and demonstrated positioning with verbal and tactile cues.    General Comments        Pertinent Vitals/Pain Pain Assessment: 0-10 Pain Score: 6   Pain Location: R knee Pain Descriptors / Indicators: Operative site guarding;Aching;Discomfort Pain Intervention(s): Limited activity within patient's tolerance    Home Living                      Prior Function            PT Goals (current goals can now be found in the care plan section) Acute Rehab PT Goals Patient Stated Goal: To get stronger and be able to do more PT Goal Formulation: With patient Time For Goal Achievement: 11/17/19 Potential to Achieve Goals: Good Progress towards PT goals: Progressing toward goals    Frequency    BID      PT Plan Current plan remains appropriate    Co-evaluation              AM-PAC PT "6 Clicks" Mobility   Outcome Measure  Help needed turning from your back to your side while in a flat bed without using bedrails?: A Lot Help needed moving from lying on your back to sitting on the side of a flat bed without using bedrails?: Total Help needed moving to and from a bed to a chair (including a wheelchair)?: Total Help needed standing up from a chair using your arms (e.g., wheelchair or bedside chair)?: A Lot Help needed to walk in hospital room?: Total Help needed climbing 3-5 steps with a railing? : Total 6 Click Score: 8    End of Session Equipment Utilized During Treatment: Gait belt Activity Tolerance: Patient limited by fatigue Patient left: in bed;with call bell/phone within reach;with bed alarm set;with family/visitor present;with SCD's reapplied Nurse Communication: Mobility status PT Visit Diagnosis: Muscle weakness (generalized) (M62.81);History of falling (Z91.81);Difficulty in walking, not elsewhere classified (R26.2);Pain Pain - Right/Left: Right Pain - part of body: Knee     Time: 1504-1364 PT Time Calculation (min) (ACUTE ONLY): 32 min  Charges:  $Therapeutic Exercise: 8-22 mins $Therapeutic Activity: 8-22 mins                     Greggory Stallion, PT,  DPT [phone]    Justin Shannon 11/07/2019, 1:00 PM

## 2019-11-07 NOTE — Progress Notes (Signed)
  Subjective: 5 Days Post-Op Procedure(s) (LRB): COMPUTER ASSISTED TOTAL KNEE ARTHROPLASTY (Right) APPLICATION OF WOUND VAC (Right) Patient reports pain as well-controlled.   Patient is well, and has had no acute complaints or problems Plan is to go Skilled nursing facility after hospital stay. Negative for chest pain and shortness of breath Fever: no Gastrointestinal: negative for nausea and vomiting.   Patient has had a bowel movement.  Objective: Vital signs in last 24 hours: Temp:  [98.1 F (36.7 C)-98.9 F (37.2 C)] 98.9 F (37.2 C) (05/17 0012) Pulse Rate:  [71-91] 72 (05/17 0015) Resp:  [14-17] 14 (05/17 0012) BP: (97-149)/(47-62) 117/53 (05/17 0015) SpO2:  [92 %-100 %] 100 % (05/17 0012)  Intake/Output from previous day:  Intake/Output Summary (Last 24 hours) at 11/07/2019 0704 Last data filed at 11/07/2019 0609 Gross per 24 hour  Intake 480 ml  Output 2475 ml  Net -1995 ml    Intake/Output this shift: No intake/output data recorded.  Labs: Recent Labs    11/06/19 0420  HGB 7.4*   Recent Labs    11/06/19 0420  WBC 11.5*  RBC 3.19*  HCT 21.9*  PLT 137*   Recent Labs    11/06/19 0420  NA 141  K 4.8  CL 109  CO2 24  BUN 75*  CREATININE 2.72*  GLUCOSE 105*  CALCIUM 9.1   No results for input(s): LABPT, INR in the last 72 hours.   EXAM General - Patient is Alert, Appropriate and Oriented Extremity - Neurovascular intact Dorsiflexion/Plantar flexion intact Compartment soft Dressing/Incision -Polar Care in place and working. , Engineer, manufacturing systems in place and working  Investment banker, corporate - intact, moving foot and toes well on exam.  Cardiovascular- Regular rate and rhythm, no murmurs/rubs/gallops Respiratory- Mild expiratory wheezing heard, otherwise clear to auscultation    Assessment/Plan: 5 Days Post-Op Procedure(s) (LRB): COMPUTER ASSISTED TOTAL KNEE ARTHROPLASTY (Right) APPLICATION OF WOUND VAC (Right) Active Problems:   Total knee replacement  status  Estimated body mass index is 38.47 kg/m as calculated from the following:   Height as of this encounter: 5\' 11"  (1.803 m).   Weight as of this encounter: 125.1 kg. Advance diet Up with therapy Discharge to SNF pending insurance approval  Continue pulmonary toilet.  DVT Prophylaxis - Xarelto and foot pumps Weight-Bearing as tolerated to right leg  [name], PA-C [facility] Orthopaedic Surgery 11/07/2019, 7:04 AM

## 2019-11-07 NOTE — Care Management Important Message (Signed)
Important Message  Patient Details  Name: Justin Shannon. MRN: [id number] Date of Birth: 1940/10/29   Medicare Important Message Given:  Yes     Dannette Barbara 11/07/2019, 2:53 PM

## 2019-11-07 NOTE — Progress Notes (Signed)
Occupational Therapy Treatment Patient Details Name: Justin Shannon. MRN: [id number] DOB: [birth year] Today's Date: 11/07/2019    History of present illness 79 y/o male s/p R TKA 11/02/19.   OT comments  Pt presents this morning awake in bed and agreeable to treatment, although saying "I guess I am nervous." Calmed with active listening and personal expressions of his faith. Overall, pt requires Max A +2 for functional mobility during ADL (bed mobility, BLE mgt and sit <> stand from elevated surface with verbal and tactile cues); RUE>LUE strength for engaging in ADL with UE (washing his face, feeding). Pt stood at EOB using RW with Min A +2 for support and BUE positioning and required Total Assist for pericare. He was unable to lift his RLE to laterally step closer to [facility]. Described pain as 10/10 and returned to supine in bed with Max A +2; RN brought in meds. Pt educated on and reviewed compression stocking mgt, polar care mgt, and use of AE to assist with ADL. Pt recalled components of education with therapist prompting. Additionally, pt educated on body mechanics and positioning for functional mobility and facilitating ADL engagement. He was receptive and demonstrated positioning with heavy verbal and tactile cues. Pt was incredibly appreciative of session and will continue to benefit from skilled acute OT services. Discharge recommendations remain the same.       Follow Up Recommendations  SNF    Equipment Recommendations  3 in 1 bedside commode    Recommendations for Other Services      Precautions / Restrictions Precautions Precautions: Fall;Knee Required Braces or Orthoses: (Bone Foam) Restrictions Weight Bearing Restrictions: Yes RLE Weight Bearing: Weight bearing as tolerated       Mobility Bed Mobility Overal bed mobility: Needs Assistance Bed Mobility: Sit to Supine     Supine to sit: Max assist;+2 for physical assistance;HOB elevated(Pt did assist with he LLE and a  little with RLE) Sit to supine: Max assist;+2 for physical assistance   General bed mobility comments: Pt required Max A +2 for repositioning x2 attempts; additionally required Max verbal and tactile cues for successful LLE and RUE placement to assist.  Transfers Overall transfer level: Needs assistance Equipment used: Rolling walker (2 wheeled) Transfers: Sit to/from Stand Sit to Stand: Max assist;From elevated surface;+2 physical assistance(with RW)         General transfer comment: Pt needed Max A +2 for general mobility, including bed mobility and sit <> stand transfer. Additionally, needed Min A +2 and Max verbal/tactile cues for standing at RW. Unable to lift R leg to take lateral steps using RW, with Max A +2 and heavy cueing.    Balance Overall balance assessment: Needs assistance Sitting-balance support: Feet supported;No upper extremity supported Sitting balance-Leahy Scale: Good Sitting balance - Comments: Pt able to maintain sitting at EOB w/o assist, poor tolerance to scooting/weight shifting Postural control: Posterior lean(noted limited DF/PF of B ankles) Standing balance support: Bilateral upper extremity supported Standing balance-Leahy Scale: Poor Standing balance comment: Pt required Min A +2 while using RW for maintaining standing balance. Required some assist to correct for leaning/unsteadiness.                           ADL either performed or assessed with clinical judgement   ADL Overall ADL's : Needs assistance/impaired     Grooming: Wash/dry face;Set up;Supervision/safety Grooming Details (indicate cue type and reason): Pt used washcloth to wash off his face with setup  assist while sitting EOB unsupported with supervision.                     Toileting- Clothing Manipulation and Hygiene: Total assistance;With adaptive equipment;+2 for physical assistance Toileting - Clothing Manipulation Details (indicate cue type and reason): Pt  required total assist for pericare with +2 for physical assistance and RW while standing at EOB.     Functional mobility during ADLs: Rolling walker;Cueing for safety;Cueing for sequencing;+2 for physical assistance;Maximal assistance General ADL Comments: Overall, pt requires Max A +2 for functional mobility during ADL (specifically those with BLE engagement); RUE>LUE for strength and to provide assistance and engage in ADL with UE (washing his face, self-feeding).     Vision Baseline Vision/History: Wears glasses;Cataracts Wears Glasses: At all times Patient Visual Report: No change from baseline Vision Assessment?: No apparent visual deficits   Perception     Praxis      Cognition Arousal/Alertness: Awake/alert Behavior During Therapy: Anxious                                   General Comments: At start of session, pt was emotional over a dream that he had, but calmed with time and active listening. Pt verbalized being nervous during the session, with hand tremors noted. He mentioned his faith several times, saying "but I'm not worried, because God will carry me through"        Exercises Other Exercises Other Exercises: Pt educated on and reviewed compression stocking mgt, polar care mgt, and use of AE to assist with ADL. Pt recalled components of education with therapist prompting. Other Exercises: Pt educated on body mechanics and positioning for functional mobility and facilitating ADL engagement. Pt was receptive and demonstrated positioning with verbal and tactile cues.   Shoulder Instructions       General Comments      Pertinent Vitals/ Pain       Pain Assessment: 0-10 Pain Score: 10-Worst pain ever(Following sit to stand t/f using RW and Max A x2) Pain Location: R knee Pain Descriptors / Indicators: Operative site guarding;Aching;Discomfort Pain Intervention(s): Limited activity within patient's tolerance;Monitored during session;Repositioned;Ice  applied;RN gave pain meds during session  Home Living                                          Prior Functioning/Environment              Frequency  Min 2X/week        Progress Toward Goals  OT Goals(current goals can now be found in the care plan section)  Progress towards OT goals: OT to reassess next treatment  Acute Rehab OT Goals Patient Stated Goal: To get stronger and be able to do more OT Goal Formulation: With patient Time For Goal Achievement: 11/21/19 Potential to Achieve Goals: [place] Discharge plan remains appropriate    Co-evaluation                 AM-PAC OT "6 Clicks" Daily Activity     Outcome Measure   Help from another person eating meals?: None Help from another person taking care of personal grooming?: None Help from another person toileting, which includes using toliet, bedpan, or urinal?: Total Help from another person bathing (including washing, rinsing, drying)?: A Lot Help from  another person to put on and taking off regular upper body clothing?: A Little Help from another person to put on and taking off regular lower body clothing?: A Lot 6 Click Score: 16    End of Session Equipment Utilized During Treatment: Gait belt;Rolling walker  OT Visit Diagnosis: Other abnormalities of gait and mobility (R26.89);History of falling (Z91.81);Pain Pain - Right/Left: Right Pain - part of body: Knee   Activity Tolerance Patient limited by pain   Patient Left in bed;with call bell/phone within reach;with bed alarm set;with SCD's reapplied   Nurse Communication Patient requests pain meds        Time: 8916-9450 OT Time Calculation (min): 51 min  Charges: OT Treatments $Self Care/Home Management : 38-52 mins  Jerilynn Birkenhead, OTS 11/07/19, 10:30 AM

## 2019-11-07 NOTE — TOC Progression Note (Signed)
Transition of Care (TOC) - Progression Note    Patient Details  Name: Justin Shannon. MRN: [id number] Date of Birth: Dec 21, 1940  [facility]) CM/SW Contact  Su Hilt, RN Phone Number: 11/07/2019, 1:15 PM  Clinical Narrative:     Patient has had both Covid Vaccines       Expected Discharge Plan and Services                                                 Social Determinants of Health (SDOH) Interventions    Readmission Risk Interventions No flowsheet data found.

## 2019-11-07 NOTE — Discharge Summary (Signed)
Physician Discharge Summary  Patient ID: [name]. MRN: [id number] DOB/AGE: 78-Nov-1942 78 y.o.  Admit date: 11/02/2019 Discharge date: 11/07/2019  Admission Diagnoses:  Total knee replacement status [Z96.659]  Surgeries:Procedure(s):  Right total knee arthroplasty using computer-assisted navigation  SURGEON:  Marciano Sequin. M.D.  ASSISTANT: [name], PA-C (present and scrubbed throughout the case, critical for assistance with exposure, retraction, instrumentation, and closure)  ANESTHESIA: spinal  ESTIMATED BLOOD LOSS: 50 mL  FLUIDS REPLACED: 1100 mL of crystalloid  TOURNIQUET TIME: 116 minutes  DRAINS: 2 medium Hemovac drains  SOFT TISSUE RELEASES: Anterior cruciate ligament, posterior cruciate ligament, deep medial collateral ligament, patellofemoral ligament  IMPLANTS UTILIZED: DePuy Attune size 6 posterior stabilized femoral component (cemented), size 7 rotating platform tibial component (cemented), 41 mm medialized dome patella (cemented), and a 5 mm stabilized rotating platform polyethylene insert.  Discharge Diagnoses: Patient Active Problem List   Diagnosis Date Noted  . Total knee replacement status 11/02/2019  . PAD (peripheral artery disease) ([place]) 10/13/2019  . Hyperkalemia   . Acute kidney injury superimposed on CKD ([place])   . Anemia of chronic renal failure, stage 3a   . Benign prostatic hyperplasia   . Seizure ([place])   . Pneumonia due to COVID-19 virus 06/24/2019  . Respiratory failure with hypoxia ([place]) 06/24/2019  . Anemia due to stage 4 chronic kidney disease ([place]) 06/24/2019  . Essential hypertension 06/24/2019  . Generalized weakness 06/24/2019  . Primary osteoarthritis of right knee 04/03/2019  . Venous ulcer of ankle, right ([place]) 05/08/2018  . Use of cane as ambulatory aid 04/27/2018  . Type 2 diabetes mellitus with stage 4 chronic kidney disease, without long-term current use of insulin ([place]) 06/11/2017  . Lymphedema  05/04/2016  . Chronic venous insufficiency 05/04/2016  . Toe sprain 01/24/2016  . Health care maintenance 12/11/2014  . Morbid (severe) obesity due to excess calories ([place]) 05/24/2014  . Morbid obesity with body mass index of 40.0-44.9 in adult [facility]) 05/24/2014  . OSA on CPAP 01/10/2014  . Chronic kidney disease (CKD), stage III (moderate) 11/22/2013  . Allergic rhinitis 11/06/2013  . Benign hypertension with chronic kidney disease, stage III ([place]) 11/06/2013  . Epilepsy ([place]) 11/06/2013  . Gout 11/06/2013  . History of DVT (deep vein thrombosis) 11/06/2013  . Nocturnal seizures ([place]) 11/06/2013  . Chronic deep vein thrombosis (DVT) of proximal vein of lower extremity ([place]) 11/06/2013    Past Medical History:  Diagnosis Date  . Arthritis   . Bilateral swelling of feet   . BPH (benign prostatic hyperplasia)   . Chronic kidney disease   . Diabetes mellitus without complication ([place])   . DVT (deep venous thrombosis) ([facility])    right lower extremity  . History of gout   . Hypertension   . Seizures ([place])   . Sleep apnea   . Tendonitis of foot    left     Transfusion:    Consultants (if any):   Discharged Condition: Improved  Hospital Course: [place] Dagher. is an 79 y.o. male who was admitted 11/02/2019 with a diagnosis of right knee osteoarthritis and went to the operating room on 11/02/2019 and underwent right total knee arthroplasty. The patient received perioperative antibiotics for prophylaxis (see below). The patient tolerated the procedure well and was transported to PACU in stable condition. After meeting PACU criteria, the patient was subsequently transferred to the Orthopaedics/Rehabilitation unit.   The patient received DVT prophylaxis in the form of early mobilization, Xarelto, Foot Pumps and TED  hose. A sacral pad had been placed and heels were elevated off of the bed with rolled towels in order to protect skin integrity. Foley catheter was discontinued on  postoperative day #0.    Physical therapy was initiated postoperatively for transfers, gait training, and strengthening. Occupational therapy was initiated for activities of daily living and evaluation for assisted devices. Rehabilitation goals were reviewed in detail with the patient. The patient made some progress with physical therapy initially but regressed thereafter and physical therapy recommended discharge to Skilled nursing facility.   The patient achieved his preliminary goals of this hospitalization and was felt to be medically and orthopaedically appropriate for discharge.  He was given perioperative antibiotics:  Anti-infectives (From admission, onward)   Start     Dose/Rate Route Frequency Ordered Stop   11/02/19 1800  ceFAZolin (ANCEF) IVPB 2g/100 mL premix     2 g 200 mL/hr over 30 Minutes Intravenous Every 6 hours 11/02/19 1726 11/03/19 1258   11/02/19 1015  ceFAZolin (ANCEF) IVPB 2g/100 mL premix     2 g 200 mL/hr over 30 Minutes Intravenous On call to O.R. 11/02/19 1001 11/02/19 1215   11/02/19 1006  ceFAZolin (ANCEF) 2-4 GM/100ML-% IVPB    Note to Pharmacy: Lyman Bishop   : cabinet override      11/02/19 1006 11/02/19 1218    .  Recent vital signs:  Vitals:   11/07/19 0955 11/07/19 1102  BP:  138/60  Pulse:  80  Resp:    Temp:    SpO2: 99%     Recent laboratory studies:  Recent Labs    11/06/19 0420  WBC 11.5*  HGB 7.4*  HCT 21.9*  PLT 137*  K 4.8  CL 109  CO2 24  BUN 75*  CREATININE 2.72*  GLUCOSE 105*  CALCIUM 9.1    Diagnostic Studies: DG Chest 2 View  Result Date: 11/04/2019 CLINICAL DATA:  Shortness of breath, COVID positive. EXAM: CHEST - 2 VIEW COMPARISON:  06/24/2019 FINDINGS: Very low lung volumes. Bibasilar atelectasis. Heart is mildly enlarged. No visible effusions or pneumothorax. IMPRESSION: Very low lung volumes with bibasilar atelectasis. Electronically Signed   By: Rolm Baptise M.D.   On: 11/04/2019 19:59   PERIPHERAL  VASCULAR CATHETERIZATION  Result Date: 10/25/2019 See op note  DG Knee Right Port  Result Date: 11/02/2019 CLINICAL DATA:  Post RIGHT knee replacement surgery EXAM: PORTABLE RIGHT KNEE - 1-2 VIEW COMPARISON:  Portable exam 1644 hours compared to 02/20/2016 FINDINGS: Osseous demineralization. Components of a RIGHT knee prosthesis are identified. No fracture, dislocation, or bone destruction. No periprosthetic lucency. Anterior skin clips and expected postsurgical changes of the anterior soft tissues. IMPRESSION: RIGHT knee prosthesis and osseous demineralization without acute bony abnormalities. Electronically Signed   By: Lavonia Dana M.D.   On: 11/02/2019 17:05    Discharge Medications:   Allergies as of 11/07/2019      Reactions   Shellfish Allergy Other (See Comments), Anaphylaxis   Unknown    Iodinated Diagnostic Agents Other (See Comments)   Chronic kidney disease 3b   Other Other (See Comments)   Chronic kidney disease 3b      Medication List    TAKE these medications   Accu-Chek FastClix Lancets Misc USE TO CHECK GLUCOSE TWICE DAILY   Accu-Chek Guide Me w/Device Kit 2 (two) times daily.   Accu-Chek Guide test strip Generic drug: glucose blood USE STRIP TO CHECK GLUCOSE TWICE DAILY   allopurinol 100 MG tablet Commonly known as: ZYLOPRIM  Take 100 mg by mouth daily.   Colace 100 MG capsule Generic drug: docusate sodium Take 100 mg by mouth daily.   diltiazem 120 MG 24 hr capsule Commonly known as: CARDIZEM CD Take 240 mg by mouth daily.   fexofenadine 180 MG tablet Commonly known as: ALLEGRA Take 180 mg by mouth daily as needed for allergies.   fluticasone 50 MCG/ACT nasal spray Commonly known as: FLONASE Place 2 sprays into both nostrils daily as needed for allergies.   levETIRAcetam 500 MG tablet Commonly known as: KEPPRA Take 500 mg by mouth 2 (two) times daily.   metoprolol succinate 50 MG 24 hr tablet Commonly known as: TOPROL-XL Take 50 mg by mouth  daily.   ONE-A-DAY MENS PO Take 1 tablet by mouth daily.   oxyCODONE 5 MG immediate release tablet Commonly known as: Oxy IR/ROXICODONE Take 1 tablet (5 mg total) by mouth every 4 (four) hours as needed for moderate pain (pain score 4-6).   torsemide 20 MG tablet Commonly known as: DEMADEX Take 20 mg by mouth daily.   traMADol 50 MG tablet Commonly known as: ULTRAM Take 1 tablet (50 mg total) by mouth every 4 (four) hours as needed for moderate pain.   vitamin C 500 MG tablet Commonly known as: ASCORBIC ACID Take 500 mg by mouth daily.   Xarelto 20 MG Tabs tablet Generic drug: rivaroxaban Take 20 mg by mouth daily.            Durable Medical Equipment  (From admission, onward)         Start     Ordered   11/02/19 1727  DME Walker rolling  Once    Question:  Patient needs a walker to treat with the following condition  Answer:  Total knee replacement status   11/02/19 1726   11/02/19 1727  DME Bedside commode  Once    Question:  Patient needs a bedside commode to treat with the following condition  Answer:  Total knee replacement status   11/02/19 1726          Disposition: [place]  There are no questions and answers to display.           Contact information for follow-up providers    U[facility]ano Heir On 11/17/2019.   Specialty: Orthopedic Surgery Why: at 9:45am Contact information: [place] [address] [phone]        Dereck Leep, MD On 12/15/2019.   Specialty: Orthopedic Surgery Why: at 2:15pm Contact information: [address] [facility] [place] [address] [phone]            Contact information for after-discharge care    [facility] .   Service: Skilled Nursing Contact information: [place], PA-C 11/07/2019, 11:39 AM

## 2019-11-07 NOTE — Discharge Planning (Signed)
IV and tele removed.  RN assessment and VS revealed stability for DC to SNF with polarcare and prevenna [place] in place.  Discharge papers printed and placed in SNF packet.  Called report and s/w Early Osmond, LPN at [facility] Script also in packet.  EMS contacted to transport to room 401. Waiting on arrival.

## 2019-11-07 NOTE — TOC Transition Note (Signed)
Transition of Care [facility]) - CM/SW Discharge Note   Patient Details  Name: Justin Shannon. MRN: [id number] Date of Birth: September 28, 1940  Transition of Care [facility]) CM/SW Contact:  Su Hilt, RN Phone Number: 11/07/2019, 3:53 PM   Clinical Narrative:     Spoke with the Bedside nurse, he is calling report to the facility, I called EMS and they put the patient on the list he has 3 ahead of him. DC packet on the chart        Patient Goals and CMS Choice        Discharge Placement                       Discharge Plan and Services                                     Social Determinants of Health (SDOH) Interventions     Readmission Risk Interventions No flowsheet data found.

## 2019-11-24 ENCOUNTER — Ambulatory Visit (INDEPENDENT_AMBULATORY_CARE_PROVIDER_SITE_OTHER): Payer: Medicare Other | Admitting: Vascular Surgery

## 2019-11-24 ENCOUNTER — Encounter (INDEPENDENT_AMBULATORY_CARE_PROVIDER_SITE_OTHER): Payer: Self-pay

## 2019-11-24 ENCOUNTER — Encounter (INDEPENDENT_AMBULATORY_CARE_PROVIDER_SITE_OTHER): Payer: Medicare Other

## 2019-12-01 ENCOUNTER — Other Ambulatory Visit
Admission: RE | Admit: 2019-12-01 | Discharge: 2019-12-01 | Disposition: A | Discharge status: Home / Self Care | Payer: Medicare Other | Source: Ambulatory Visit | Attending: Orthopedic Surgery | Admitting: Orthopedic Surgery

## 2019-12-01 ENCOUNTER — Other Ambulatory Visit: Payer: Self-pay

## 2019-12-01 DIAGNOSIS — Z20822 Contact with and (suspected) exposure to covid-19: Secondary | ICD-10-CM | POA: Insufficient documentation

## 2019-12-01 DIAGNOSIS — Z01812 Encounter for preprocedural laboratory examination: Secondary | ICD-10-CM | POA: Diagnosis present

## 2019-12-02 LAB — SARS CORONAVIRUS 2 (TAT 6-24 HRS): SARS Coronavirus 2: NEGATIVE

## 2019-12-02 NOTE — Progress Notes (Signed)
Called patient to confirm that he had stopped taking Xarelto today. [name] wife answered and reported that the patient had stop talking Xarelto on 6/10/21as per Dr Clydell Hakim instructions.

## 2019-12-04 ENCOUNTER — Encounter: Payer: Self-pay | Admitting: Orthopedic Surgery

## 2019-12-04 MED ORDER — DEXTROSE 5 % IV SOLN
3.0000 g | INTRAVENOUS | Status: AC
  Administered 2019-12-05: 3 g via INTRAVENOUS
  Filled 2019-12-04: qty 3000
  Filled 2019-12-04: qty 3

## 2019-12-04 NOTE — H&P (Signed)
ORTHOPAEDIC HISTORY & PHYSICAL Progress Notes Gwenlyn Fudge, PA - 12/01/2019 9:00 AM EDT [place] [facility] Chief Complaint:   Chief Complaint  Patient presents with  . Knee Pain  POST OP RIGHT KNEE; SX: 11/02/2019   History of Present Illness:    Justin Shannon is a 79 y.o. male that presents 4 weeks status post right total knee arthroplasty by Dr. Marry Guan on 11/02/19. Patient presents with his daughter.  Presents for follow-up evaluation after leaving his rehabilitation facility. Patient states that in rehab he felt a pop. At one time, patient states that the pop was felt when two physical therapists were bending his knee backwards. Another time, patient states that the pop was felt when he was rising from a seated position. Patient has had increased pain and difficulty with ambulation since this incident approximately 2 weeks ago. [name] daughter requests something for patient's pain as they only have tramadol from the skilled nursing facility.  Past Medical, Surgical, Family, Social History, Allergies, Medications:   Past Medical History:  Past Medical History:  Diagnosis Date  . Allergic rhinitis  . 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 Cataract cortical, senile docter said small cataracts  . Chicken pox  . Chronic kidney disease 6 years ago  . DVT (deep venous thrombosis) (CMS-HCC) 2013  . Gout  . Hypertension  . OSA (obstructive sleep apnea) 01/10/2014  . Seizures (CMS-HCC)   Past Surgical History:  Past Surgical History:  Procedure Laterality Date  . Right total knee arthroplasty using computer-assisted navigation 11/02/2019  Dr Marry Guan  . APPENDECTOMY 2001  . HERNIA REPAIR 2000  . Prostate surgery 2013  . Throat mass removed   Current Medications:  Current Outpatient Medications  Medication Sig Dispense Refill  . ACCU-CHEK GUIDE ME GLUCOSE MTR Misc 2 (two) times daily  . allopurinoL (ZYLOPRIM) 100 MG tablet Take 1 tablet  (100 mg total) by mouth once daily 30 tablet 1  . ascorbic acid, vitamin C, (VITAMIN C) 500 MG tablet Take 1 tablet by mouth once daily  . blood glucose diagnostic test strip Use 1 each (1 strip total) 2 (two) times daily Use as instructed. 100 each 6  . diltiazem (CARDIZEM CD) 120 MG XR capsule Take 1 capsule (120 mg total) by mouth 2 (two) times daily 180 capsule 1  . docusate (COLACE) 100 MG capsule Take 200 mg by mouth 2 (two) times daily as needed  . fexofenadine (ALLEGRA ALLERGY) 180 MG tablet Take 180 mg by mouth once daily as needed  . fluticasone (FLONASE) 50 mcg/actuation nasal spray Place 2 sprays into both nostrils once daily. (Patient taking differently: Place 2 sprays into both nostrils once daily as needed ) 16 g 3  . HYDROcodone-acetaminophen (NORCO) 5-325 mg tablet Take 1 tablet by mouth every 4 (four) hours as needed for Pain 20 tablet 0  . lancets Use 1 each 2 (two) times daily Use as instructed. 100 each 12  . levETIRAcetam (KEPPRA) 500 MG tablet TAKE 1 TABLET BY MOUTH TWICE DAILY 180 tablet 3  . metoprolol succinate (TOPROL-XL) 50 MG XL tablet TAKE 1 TABLET BY MOUTH ONCE DAILY 90 tablet 3  . multivitamin tablet Take 1 tablet by mouth once daily.  Marland Kitchen olmesartan-hydrochlorothiazide (BENICAR HCT) 40-12.5 mg tablet Take 1 tablet by mouth once daily  . TORsemide (DEMADEX) 20 MG tablet Take 20 mg by mouth once daily  . traMADoL (ULTRAM) 50 mg tablet Take 50 mg by mouth every 6 (six)  hours as needed  . triamcinolone 0.1 % cream Apply topically 2 (two) times daily as needed 30 g 0  . XARELTO 20 mg tablet Take 1 tablet by mouth once daily with breakfast 90 tablet 1   No current facility-administered medications for this visit.   Allergies:  Allergies  Allergen Reactions  . Shellfish Containing Products Anaphylaxis  . Iodinated Contrast Media Kidney Disorder  Chronic kidney disease 3b  . Nsaids (Non-Steroidal Anti-Inflammatory Drug) Kidney Disorder  Chronic kidney disease 3b    Social History:  Social History   Socioeconomic History  . Marital status: Married  Spouse name: Justin Shannon  . Number of children: 3  . Years of education: 68  . Highest education level: Not on file  Occupational History  . Occupation: Retired  Tobacco Use  . Smoking status: Former Smoker  Packs/day: 0.00  Years: 0.00  Pack years: 0.00  Quit date: 06/23/1981  Years since quitting: 38.4  . Smokeless tobacco: Never Used  Vaping Use  . Vaping Use: Never assessed  Substance and Sexual Activity  . Alcohol use: Not Currently  Alcohol/week: 0.0 standard drinks  Comment: stopped 1983  . Drug use: No  . Sexual activity: Not Currently  Partners: Female  Other Topics Concern  . Not on file  Social History Narrative  . Not on file   Social Determinants of Health   Financial Resource Strain:  . Difficulty of Paying Living Expenses:  Food Insecurity:  . Worried About Charity fundraiser in the Last Year:  . Arboriculturist in the Last Year:  Transportation Needs:  . Film/video editor (Medical):  Marland Kitchen Lack of Transportation (Non-Medical):  Physical Activity:  . Days of Exercise per Week:  . Minutes of Exercise per Session:  Stress:  . Feeling of Stress :  Social Connections:  . Frequency of Communication with Friends and Family:  . Frequency of Social Gatherings with Friends and Family:  . Attends Religious Services:  . Active Member of Clubs or Organizations:  . Attends Archivist Meetings:  Marland Kitchen Marital Status:   Family History:  Family History  Problem Relation Age of Onset  . High blood pressure (Hypertension) Mother  . No Known Problems Father  . High blood pressure (Hypertension) Other  . Obesity Brother   Review of Systems:   A 10+ ROS was performed, reviewed, and the pertinent orthopaedic findings are documented in the HPI.   Physical Examination:   BP 140/80  Ht 177.8 cm (5\' 10" )  Wt (!) 125.3 kg (276 lb 3.2 oz)  BMI 39.63 kg/m   Patient is a  well-developed, well-nourished male in no acute distress. Patient has normal mood and affect. Patient is alert and oriented to person, place, and time. Pupils are equal and round with synchronous movement. No injected sclera. Respirations are normal, without noticeable retractions.   Patient ambulates in a wheelchair. Patient presents without TED hose on. The patient is able to actively flex the right hip. Internal and external rotation of the hip does not produce pain.  Skin over the right knee is clean and dry. The incision is well-healed, without erythema, drainage, or signs of infection. Mild effusion noted over the right knee. Range of motion examination reveals patient is unable to actively extend knee. When knee is passively extended, patient is unable to maintain this position on his own. The patient is able to plantarflex and dorsiflex the right ankle.   Patient is able to flex and extend  the right hallux. Sensation is intact over the saphenous, lateral sural cutaneous, superficial fibular, and deep fibular nerve distributions. Dorsalis pedis 2+.  Cardiovascular: Regular rate and rhythm, without murmurs, rubs, or gallops.  Respiratory: Lungs clear to auscultation bilaterally.  Tests Performed/Reviewed:  X-rays  Anteroposterior, lateral, and sunrise views of the knee were obtained. Images reveal patella alta. No periprosthetic lucency is noted around the total knee arthroplasty.  Diagnostic ultrasound Dr. Rosalia Hammers examined patient's knee using ultrasound and confirmed patellar tendon rupture. See his note for full details.  Impression:   ICD-10-CM  1. Rupture of right patellar tendon, initial encounter J17.915A  2. S/P total knee arthroplasty, right Z96.651   SECONDARY CONDITIONS THAT INFLUENCE TREATMENT AND DECISION-MAKING:   Plan:   -Status post right total knee arthroplasty 11/02/2019 -Right patellar tendon rupture Radiographs, physical exam, and diagnostic  ultrasound performed by Dr. Rosalia Hammers confirm presence of right patellar tendon rupture. This requires operative treatment. Patient will undergo repair of right patellar tendon by Dr. Marry Guan on 12/05/2019. The risks of surgery, including infection, blood clots, and soft tissue damage were explained to the patient. Patient would like to proceed with surgical intervention at this time.  After verifying the patient's controlled substance use, hydrocodone prescribed for pain control.  Contact our office with any questions or concerns. Follow up as indicated, or sooner should any new problems arise, if conditions worsen, or if they are otherwise concerned.   Gwenlyn Fudge, PA-C [facility] [place], [place] [address] Phone: [phone]  This note was generated in part with voice recognition software and I apologize for any typographical errors that were not detected and corrected.   Electronically signed by Gwenlyn Fudge, PA at 12/01/2019 5:25 PM EDT

## 2019-12-05 ENCOUNTER — Inpatient Hospital Stay
Admission: AD | Admit: 2019-12-05 | Discharge: 2019-12-08 | DRG: 502 | Disposition: A | Discharge status: Skilled Nursing Facility | Payer: Medicare Other | Attending: Orthopedic Surgery | Admitting: Orthopedic Surgery

## 2019-12-05 ENCOUNTER — Encounter: Payer: Self-pay | Admitting: Orthopedic Surgery

## 2019-12-05 ENCOUNTER — Encounter
Admission: AD | Disposition: A | Discharge status: Skilled Nursing Facility | Payer: Self-pay | Source: Home / Self Care | Attending: Orthopedic Surgery

## 2019-12-05 ENCOUNTER — Ambulatory Visit: Payer: Medicare Other | Admitting: Anesthesiology

## 2019-12-05 DIAGNOSIS — Z91041 Radiographic dye allergy status: Secondary | ICD-10-CM

## 2019-12-05 DIAGNOSIS — Z20822 Contact with and (suspected) exposure to covid-19: Secondary | ICD-10-CM | POA: Diagnosis present

## 2019-12-05 DIAGNOSIS — S76111A Strain of right quadriceps muscle, fascia and tendon, initial encounter: Principal | ICD-10-CM | POA: Diagnosis present

## 2019-12-05 DIAGNOSIS — S86819A Strain of other muscle(s) and tendon(s) at lower leg level, unspecified leg, initial encounter: Secondary | ICD-10-CM | POA: Insufficient documentation

## 2019-12-05 DIAGNOSIS — N400 Enlarged prostate without lower urinary tract symptoms: Secondary | ICD-10-CM | POA: Diagnosis present

## 2019-12-05 DIAGNOSIS — Z91013 Allergy to seafood: Secondary | ICD-10-CM

## 2019-12-05 DIAGNOSIS — Z7901 Long term (current) use of anticoagulants: Secondary | ICD-10-CM

## 2019-12-05 DIAGNOSIS — Z886 Allergy status to analgesic agent status: Secondary | ICD-10-CM | POA: Diagnosis not present

## 2019-12-05 DIAGNOSIS — S86811A Strain of other muscle(s) and tendon(s) at lower leg level, right leg, initial encounter: Secondary | ICD-10-CM | POA: Diagnosis present

## 2019-12-05 DIAGNOSIS — Z79891 Long term (current) use of opiate analgesic: Secondary | ICD-10-CM | POA: Diagnosis not present

## 2019-12-05 DIAGNOSIS — Z96651 Presence of right artificial knee joint: Secondary | ICD-10-CM | POA: Diagnosis present

## 2019-12-05 DIAGNOSIS — J309 Allergic rhinitis, unspecified: Secondary | ICD-10-CM | POA: Diagnosis present

## 2019-12-05 DIAGNOSIS — X58XXXA Exposure to other specified factors, initial encounter: Secondary | ICD-10-CM | POA: Diagnosis present

## 2019-12-05 DIAGNOSIS — G4733 Obstructive sleep apnea (adult) (pediatric): Secondary | ICD-10-CM | POA: Diagnosis present

## 2019-12-05 DIAGNOSIS — E1122 Type 2 diabetes mellitus with diabetic chronic kidney disease: Secondary | ICD-10-CM | POA: Diagnosis present

## 2019-12-05 DIAGNOSIS — M109 Gout, unspecified: Secondary | ICD-10-CM | POA: Diagnosis present

## 2019-12-05 DIAGNOSIS — I129 Hypertensive chronic kidney disease with stage 1 through stage 4 chronic kidney disease, or unspecified chronic kidney disease: Secondary | ICD-10-CM | POA: Diagnosis present

## 2019-12-05 DIAGNOSIS — G4730 Sleep apnea, unspecified: Secondary | ICD-10-CM | POA: Diagnosis present

## 2019-12-05 DIAGNOSIS — Z79899 Other long term (current) drug therapy: Secondary | ICD-10-CM | POA: Diagnosis not present

## 2019-12-05 DIAGNOSIS — N1831 Chronic kidney disease, stage 3a: Secondary | ICD-10-CM | POA: Diagnosis present

## 2019-12-05 DIAGNOSIS — Z86718 Personal history of other venous thrombosis and embolism: Secondary | ICD-10-CM

## 2019-12-05 DIAGNOSIS — Z87891 Personal history of nicotine dependence: Secondary | ICD-10-CM | POA: Diagnosis not present

## 2019-12-05 HISTORY — PX: PATELLAR TENDON REPAIR: SHX737

## 2019-12-05 LAB — GLUCOSE, CAPILLARY
Glucose-Capillary: 94 mg/dL (ref 70–99)
Glucose-Capillary: 120 mg/dL — ABNORMAL HIGH (ref 70–99)
Glucose-Capillary: 155 mg/dL — ABNORMAL HIGH (ref 70–99)
Glucose-Capillary: 155 mg/dL — ABNORMAL HIGH (ref 70–99)

## 2019-12-05 SURGERY — REPAIR, TENDON, PATELLAR
Anesthesia: General | Site: Knee | Laterality: Right

## 2019-12-05 MED ORDER — CEFAZOLIN SODIUM-DEXTROSE 2-4 GM/100ML-% IV SOLN
2.0000 g | Freq: Four times a day (QID) | INTRAVENOUS | Status: AC
  Administered 2019-12-05 – 2019-12-06 (×3): 2 g via INTRAVENOUS
  Filled 2019-12-05 (×5): qty 100

## 2019-12-05 MED ORDER — ALLOPURINOL 100 MG PO TABS
100.0000 mg | ORAL_TABLET | Freq: Every day | ORAL | Status: DC
  Administered 2019-12-06 – 2019-12-08 (×3): 100 mg via ORAL
  Filled 2019-12-05 (×3): qty 1

## 2019-12-05 MED ORDER — MAGNESIUM HYDROXIDE 400 MG/5ML PO SUSP
30.0000 mL | Freq: Every day | ORAL | Status: DC
  Administered 2019-12-06 – 2019-12-08 (×3): 30 mL via ORAL
  Filled 2019-12-05 (×3): qty 30

## 2019-12-05 MED ORDER — PHENYLEPHRINE HCL (PRESSORS) 10 MG/ML IV SOLN
INTRAVENOUS | Status: DC | PRN
  Administered 2019-12-05 (×4): 100 ug via INTRAVENOUS

## 2019-12-05 MED ORDER — ONDANSETRON HCL 4 MG/2ML IJ SOLN: 4.0000 mg | Freq: Once | INTRAMUSCULAR | Status: DC | PRN

## 2019-12-05 MED ORDER — CEFAZOLIN SODIUM-DEXTROSE 2-4 GM/100ML-% IV SOLN
INTRAVENOUS | Status: AC
  Administered 2019-12-05: 2 g via INTRAVENOUS
  Filled 2019-12-05: qty 100

## 2019-12-05 MED ORDER — ROCURONIUM BROMIDE 100 MG/10ML IV SOLN
INTRAVENOUS | Status: DC | PRN
  Administered 2019-12-05: 50 mg via INTRAVENOUS
  Administered 2019-12-05: 30 mg via INTRAVENOUS
  Administered 2019-12-05: 20 mg via INTRAVENOUS

## 2019-12-05 MED ORDER — METOCLOPRAMIDE HCL 10 MG PO TABS: 5.0000 mg | ORAL_TABLET | Freq: Three times a day (TID) | ORAL | Status: DC | PRN

## 2019-12-05 MED ORDER — RIVAROXABAN 20 MG PO TABS
20.0000 mg | ORAL_TABLET | Freq: Every day | ORAL | Status: DC
  Administered 2019-12-06 – 2019-12-08 (×3): 20 mg via ORAL
  Filled 2019-12-05 (×3): qty 1

## 2019-12-05 MED ORDER — BISACODYL 10 MG RE SUPP
10.0000 mg | Freq: Every day | RECTAL | Status: DC | PRN
  Administered 2019-12-08: 10 mg via RECTAL
  Filled 2019-12-05: qty 1

## 2019-12-05 MED ORDER — ORAL CARE MOUTH RINSE: 15.0000 mL | Freq: Once | OROMUCOSAL | Status: AC

## 2019-12-05 MED ORDER — DIPHENHYDRAMINE HCL 12.5 MG/5ML PO ELIX: 12.5000 mg | ORAL_SOLUTION | ORAL | Status: DC | PRN

## 2019-12-05 MED ORDER — SEVOFLURANE IN SOLN
RESPIRATORY_TRACT | Status: AC
  Filled 2019-12-05: qty 250

## 2019-12-05 MED ORDER — ONDANSETRON HCL 4 MG PO TABS: 4.0000 mg | ORAL_TABLET | Freq: Four times a day (QID) | ORAL | Status: DC | PRN

## 2019-12-05 MED ORDER — BUPIVACAINE-EPINEPHRINE (PF) 0.25% -1:200000 IJ SOLN
INTRAMUSCULAR | Status: DC | PRN
  Administered 2019-12-05: 10 mL

## 2019-12-05 MED ORDER — FERROUS SULFATE 325 (65 FE) MG PO TABS
325.0000 mg | ORAL_TABLET | Freq: Two times a day (BID) | ORAL | Status: DC
  Administered 2019-12-06 – 2019-12-08 (×6): 325 mg via ORAL
  Filled 2019-12-05 (×6): qty 1

## 2019-12-05 MED ORDER — LEVETIRACETAM 500 MG PO TABS
500.0000 mg | ORAL_TABLET | Freq: Two times a day (BID) | ORAL | Status: DC
  Administered 2019-12-05 – 2019-12-08 (×6): 500 mg via ORAL
  Filled 2019-12-05 (×7): qty 1

## 2019-12-05 MED ORDER — HYDROCHLOROTHIAZIDE 12.5 MG PO CAPS
12.5000 mg | ORAL_CAPSULE | Freq: Every day | ORAL | Status: DC
  Administered 2019-12-06 – 2019-12-08 (×3): 12.5 mg via ORAL
  Filled 2019-12-05 (×3): qty 1

## 2019-12-05 MED ORDER — OXYCODONE HCL 5 MG PO TABS
5.0000 mg | ORAL_TABLET | ORAL | Status: DC | PRN
  Administered 2019-12-07 – 2019-12-08 (×2): 5 mg via ORAL
  Filled 2019-12-05 (×3): qty 1

## 2019-12-05 MED ORDER — FLEET ENEMA 7-19 GM/118ML RE ENEM
1.0000 | ENEMA | Freq: Once | RECTAL | Status: AC | PRN
  Administered 2019-12-08: 1 via RECTAL

## 2019-12-05 MED ORDER — PROPOFOL 10 MG/ML IV BOLUS
INTRAVENOUS | Status: DC | PRN
  Administered 2019-12-05: 120 mg via INTRAVENOUS

## 2019-12-05 MED ORDER — ADULT MULTIVITAMIN W/MINERALS CH
1.0000 | ORAL_TABLET | Freq: Every day | ORAL | Status: DC
  Administered 2019-12-06 – 2019-12-08 (×3): 1 via ORAL
  Filled 2019-12-05 (×3): qty 1

## 2019-12-05 MED ORDER — FAMOTIDINE 20 MG PO TABS: 20.0000 mg | ORAL_TABLET | Freq: Once | ORAL | Status: AC

## 2019-12-05 MED ORDER — ONDANSETRON HCL 4 MG/2ML IJ SOLN
INTRAMUSCULAR | Status: AC
  Filled 2019-12-05: qty 2

## 2019-12-05 MED ORDER — PHENOL 1.4 % MT LIQD
1.0000 | OROMUCOSAL | Status: DC | PRN
  Filled 2019-12-05: qty 177

## 2019-12-05 MED ORDER — OXYCODONE HCL 5 MG PO TABS
10.0000 mg | ORAL_TABLET | ORAL | Status: DC | PRN
  Administered 2019-12-06 – 2019-12-08 (×3): 10 mg via ORAL
  Filled 2019-12-05 (×3): qty 2

## 2019-12-05 MED ORDER — MOMETASONE FURO-FORMOTEROL FUM 200-5 MCG/ACT IN AERO
2.0000 | INHALATION_SPRAY | Freq: Two times a day (BID) | RESPIRATORY_TRACT | Status: DC
  Administered 2019-12-05 – 2019-12-08 (×6): 2 via RESPIRATORY_TRACT
  Filled 2019-12-05: qty 8.8

## 2019-12-05 MED ORDER — LORATADINE 10 MG PO TABS
10.0000 mg | ORAL_TABLET | Freq: Every day | ORAL | Status: DC
  Administered 2019-12-07: 10 mg via ORAL
  Filled 2019-12-05 (×3): qty 1

## 2019-12-05 MED ORDER — PROPOFOL 10 MG/ML IV BOLUS
INTRAVENOUS | Status: AC
  Filled 2019-12-05: qty 20

## 2019-12-05 MED ORDER — DEXAMETHASONE SODIUM PHOSPHATE 10 MG/ML IJ SOLN
INTRAMUSCULAR | Status: DC | PRN
  Administered 2019-12-05: 5 mg via INTRAVENOUS

## 2019-12-05 MED ORDER — TORSEMIDE 20 MG PO TABS
20.0000 mg | ORAL_TABLET | Freq: Every day | ORAL | Status: DC
  Administered 2019-12-06 – 2019-12-08 (×3): 20 mg via ORAL
  Filled 2019-12-05 (×3): qty 1

## 2019-12-05 MED ORDER — EPINEPHRINE PF 1 MG/ML IJ SOLN
INTRAMUSCULAR | Status: AC
  Filled 2019-12-05: qty 1

## 2019-12-05 MED ORDER — BUPIVACAINE HCL (PF) 0.25 % IJ SOLN
INTRAMUSCULAR | Status: AC
  Filled 2019-12-05: qty 30

## 2019-12-05 MED ORDER — FENTANYL CITRATE (PF) 100 MCG/2ML IJ SOLN
25.0000 ug | INTRAMUSCULAR | Status: DC | PRN
  Administered 2019-12-05 (×3): 25 ug via INTRAVENOUS

## 2019-12-05 MED ORDER — CHLORHEXIDINE GLUCONATE 0.12 % MT SOLN
OROMUCOSAL | Status: AC
  Administered 2019-12-05: 15 mL via OROMUCOSAL
  Filled 2019-12-05: qty 15

## 2019-12-05 MED ORDER — EPHEDRINE SULFATE 50 MG/ML IJ SOLN
INTRAMUSCULAR | Status: DC | PRN
  Administered 2019-12-05: 10 mg via INTRAVENOUS

## 2019-12-05 MED ORDER — SODIUM CHLORIDE 0.9 % IV SOLN: INTRAVENOUS | Status: DC

## 2019-12-05 MED ORDER — ALBUTEROL SULFATE (2.5 MG/3ML) 0.083% IN NEBU: 2.5000 mg | INHALATION_SOLUTION | RESPIRATORY_TRACT | Status: DC | PRN

## 2019-12-05 MED ORDER — ACETAMINOPHEN 10 MG/ML IV SOLN
INTRAVENOUS | Status: AC
  Filled 2019-12-05: qty 100

## 2019-12-05 MED ORDER — NEOMYCIN-POLYMYXIN B GU 40-200000 IR SOLN
Status: DC | PRN
  Administered 2019-12-05: 2 mL

## 2019-12-05 MED ORDER — CHLORHEXIDINE GLUCONATE 0.12 % MT SOLN: 15.0000 mL | Freq: Once | OROMUCOSAL | Status: AC

## 2019-12-05 MED ORDER — SUGAMMADEX SODIUM 500 MG/5ML IV SOLN
INTRAVENOUS | Status: DC | PRN
  Administered 2019-12-05: 250 mg via INTRAVENOUS

## 2019-12-05 MED ORDER — SUGAMMADEX SODIUM 500 MG/5ML IV SOLN
INTRAVENOUS | Status: AC
  Filled 2019-12-05: qty 5

## 2019-12-05 MED ORDER — PANTOPRAZOLE SODIUM 40 MG PO TBEC
40.0000 mg | DELAYED_RELEASE_TABLET | Freq: Two times a day (BID) | ORAL | Status: DC
  Administered 2019-12-05 – 2019-12-08 (×6): 40 mg via ORAL
  Filled 2019-12-05 (×6): qty 1

## 2019-12-05 MED ORDER — MENTHOL 3 MG MT LOZG
1.0000 | LOZENGE | OROMUCOSAL | Status: DC | PRN
  Filled 2019-12-05: qty 9

## 2019-12-05 MED ORDER — METOCLOPRAMIDE HCL 5 MG/ML IJ SOLN: 5.0000 mg | Freq: Three times a day (TID) | INTRAMUSCULAR | Status: DC | PRN

## 2019-12-05 MED ORDER — DILTIAZEM HCL ER COATED BEADS 120 MG PO CP24
120.0000 mg | ORAL_CAPSULE | Freq: Every day | ORAL | Status: DC
  Administered 2019-12-06 – 2019-12-08 (×3): 120 mg via ORAL
  Filled 2019-12-05 (×3): qty 1

## 2019-12-05 MED ORDER — FENTANYL CITRATE (PF) 100 MCG/2ML IJ SOLN
INTRAMUSCULAR | Status: AC
  Administered 2019-12-05: 25 ug via INTRAVENOUS
  Filled 2019-12-05: qty 2

## 2019-12-05 MED ORDER — NEOMYCIN-POLYMYXIN B GU 40-200000 IR SOLN
Status: AC
  Filled 2019-12-05: qty 2

## 2019-12-05 MED ORDER — METOPROLOL SUCCINATE ER 50 MG PO TB24
50.0000 mg | ORAL_TABLET | Freq: Every day | ORAL | Status: DC
  Administered 2019-12-06 – 2019-12-08 (×3): 50 mg via ORAL
  Filled 2019-12-05 (×3): qty 1

## 2019-12-05 MED ORDER — ASCORBIC ACID 500 MG PO TABS
500.0000 mg | ORAL_TABLET | Freq: Every day | ORAL | Status: DC
  Administered 2019-12-06 – 2019-12-08 (×3): 500 mg via ORAL
  Filled 2019-12-05 (×3): qty 1

## 2019-12-05 MED ORDER — IRBESARTAN 150 MG PO TABS
300.0000 mg | ORAL_TABLET | Freq: Every day | ORAL | Status: DC
  Administered 2019-12-06 – 2019-12-08 (×3): 300 mg via ORAL
  Filled 2019-12-05 (×3): qty 2

## 2019-12-05 MED ORDER — ONDANSETRON HCL 4 MG/2ML IJ SOLN: 4.0000 mg | Freq: Four times a day (QID) | INTRAMUSCULAR | Status: DC | PRN

## 2019-12-05 MED ORDER — LIDOCAINE HCL (PF) 2 % IJ SOLN
INTRAMUSCULAR | Status: AC
  Filled 2019-12-05: qty 5

## 2019-12-05 MED ORDER — FLUTICASONE PROPIONATE 50 MCG/ACT NA SUSP
2.0000 | Freq: Every day | NASAL | Status: DC | PRN
  Filled 2019-12-05: qty 16

## 2019-12-05 MED ORDER — ROCURONIUM BROMIDE 10 MG/ML (PF) SYRINGE
PREFILLED_SYRINGE | INTRAVENOUS | Status: AC
  Filled 2019-12-05: qty 10

## 2019-12-05 MED ORDER — INSULIN ASPART 100 UNIT/ML ~~LOC~~ SOLN
0.0000 [IU] | Freq: Three times a day (TID) | SUBCUTANEOUS | Status: DC
  Administered 2019-12-06 – 2019-12-07 (×2): 2 [IU] via SUBCUTANEOUS
  Filled 2019-12-05 (×2): qty 1

## 2019-12-05 MED ORDER — OLMESARTAN MEDOXOMIL-HCTZ 40-12.5 MG PO TABS: 1.0000 | ORAL_TABLET | Freq: Every day | ORAL | Status: DC

## 2019-12-05 MED ORDER — LIDOCAINE HCL (CARDIAC) PF 100 MG/5ML IV SOSY
PREFILLED_SYRINGE | INTRAVENOUS | Status: DC | PRN
  Administered 2019-12-05: 100 mg via INTRAVENOUS

## 2019-12-05 MED ORDER — HYDROMORPHONE HCL 1 MG/ML IJ SOLN: 0.5000 mg | INTRAMUSCULAR | Status: DC | PRN

## 2019-12-05 MED ORDER — ACETAMINOPHEN 10 MG/ML IV SOLN
INTRAVENOUS | Status: DC | PRN
  Administered 2019-12-05: 1000 mg via INTRAVENOUS

## 2019-12-05 MED ORDER — FENTANYL CITRATE (PF) 100 MCG/2ML IJ SOLN
INTRAMUSCULAR | Status: AC
  Filled 2019-12-05: qty 2

## 2019-12-05 MED ORDER — FAMOTIDINE 20 MG PO TABS
ORAL_TABLET | ORAL | Status: AC
  Administered 2019-12-05: 20 mg via ORAL
  Filled 2019-12-05: qty 1

## 2019-12-05 MED ORDER — FENTANYL CITRATE (PF) 100 MCG/2ML IJ SOLN
INTRAMUSCULAR | Status: DC | PRN
  Administered 2019-12-05 (×2): 50 ug via INTRAVENOUS

## 2019-12-05 MED ORDER — ALUM & MAG HYDROXIDE-SIMETH 200-200-20 MG/5ML PO SUSP: 30.0000 mL | ORAL | Status: DC | PRN

## 2019-12-05 MED ORDER — OXYCODONE HCL 5 MG PO TABS
ORAL_TABLET | ORAL | Status: AC
  Administered 2019-12-05: 5 mg via ORAL
  Filled 2019-12-05: qty 1

## 2019-12-05 MED ORDER — ONDANSETRON HCL 4 MG/2ML IJ SOLN
INTRAMUSCULAR | Status: DC | PRN
  Administered 2019-12-05: 4 mg via INTRAVENOUS

## 2019-12-05 MED ORDER — ACETAMINOPHEN 325 MG PO TABS
325.0000 mg | ORAL_TABLET | Freq: Four times a day (QID) | ORAL | Status: DC | PRN
  Administered 2019-12-08: 650 mg via ORAL
  Filled 2019-12-05: qty 2

## 2019-12-05 SURGICAL SUPPLY — 49 items
ANCHOR SUT BIO SW 4.75X19.1 (Anchor) ×4 IMPLANT
ANCHOR SWIVELOCK BIO COMP (Anchor) ×1 IMPLANT
BRACE KNEE POST OP SHORT (BRACE) ×1 IMPLANT
CANISTER SUCT 1200ML W/VALVE (MISCELLANEOUS) ×2 IMPLANT
COOLER POLAR GLACIER W/PUMP (MISCELLANEOUS) ×1 IMPLANT
COVER WAND RF STERILE (DRAPES) ×2 IMPLANT
CRADLE LAMINECT ARM (MISCELLANEOUS) ×1 IMPLANT
CUFF TOURN SGL QUICK 24 (TOURNIQUET CUFF)
CUFF TOURN SGL QUICK 30 (TOURNIQUET CUFF)
CUFF TOURN SGL QUICK 34 (TOURNIQUET CUFF) ×2
CUFF TRNQT CYL 24X4X16.5-23 (TOURNIQUET CUFF) IMPLANT
CUFF TRNQT CYL 30X4X21-28X (TOURNIQUET CUFF) IMPLANT
CUFF TRNQT CYL 34X4.125X (TOURNIQUET CUFF) IMPLANT
DRAPE INCISE IOBAN 66X45 STRL (DRAPES) IMPLANT
DRSG DERMACEA 8X12 NADH (GAUZE/BANDAGES/DRESSINGS) ×2 IMPLANT
DRSG OPSITE POSTOP 4X14 (GAUZE/BANDAGES/DRESSINGS) ×1 IMPLANT
DURAPREP 26ML APPLICATOR (WOUND CARE) ×2 IMPLANT
ELECT CAUTERY BLADE 6.4 (BLADE) ×2 IMPLANT
ELECT REM PT RETURN 9FT ADLT (ELECTROSURGICAL) ×2
ELECTRODE REM PT RTRN 9FT ADLT (ELECTROSURGICAL) ×1 IMPLANT
FIBER TAPE 2MM (SUTURE) ×6 IMPLANT
GAUZE SPONGE 4X4 12PLY STRL (GAUZE/BANDAGES/DRESSINGS) ×1 IMPLANT
GLOVE BIOGEL M STRL SZ7.5 (GLOVE) ×3 IMPLANT
GLOVE INDICATOR 8.0 STRL GRN (GLOVE) ×2 IMPLANT
GOWN STRL REUS W/ TWL LRG LVL3 (GOWN DISPOSABLE) ×1 IMPLANT
GOWN STRL REUS W/TWL LRG LVL3 (GOWN DISPOSABLE) ×2
HOOD PEEL AWAY FLYTE STAYCOOL (MISCELLANEOUS) ×2 IMPLANT
IMMOB KNEE 24 THIGH 24 443303 (SOFTGOODS) ×1 IMPLANT
KIT TURNOVER KIT A (KITS) ×2 IMPLANT
NDL HYPO 25X1 1.5 SAFETY (NEEDLE) ×1 IMPLANT
NEEDLE HYPO 25X1 1.5 SAFETY (NEEDLE) ×2 IMPLANT
NS IRRIG 500ML POUR BTL (IV SOLUTION) ×2 IMPLANT
PACK TOTAL KNEE (MISCELLANEOUS) ×2 IMPLANT
PAD WRAPON POLOR MULTI XL (MISCELLANEOUS) IMPLANT
SOL PREP PVP 2OZ (MISCELLANEOUS) ×2
SOLUTION PREP PVP 2OZ (MISCELLANEOUS) ×1 IMPLANT
STAPLER SKIN PROX 35W (STAPLE) ×2 IMPLANT
SUT VIC AB 0 CT1 27 (SUTURE) ×2
SUT VIC AB 0 CT1 27XCR 8 STRN (SUTURE) ×1 IMPLANT
SUT VIC AB 0 CT1 36 (SUTURE) ×3 IMPLANT
SUT VIC AB 2-0 CT1 27 (SUTURE) ×2
SUT VIC AB 2-0 CT1 TAPERPNT 27 (SUTURE) ×1 IMPLANT
SYR 10ML LL (SYRINGE) ×2 IMPLANT
SYR 30ML LL (SYRINGE) ×2 IMPLANT
SYS INTERNAL BRACE KNEE (Miscellaneous) ×2 IMPLANT
SYSTEM INTERNAL BRACE KNEE (Miscellaneous) ×1 IMPLANT
TRAY FOLEY MTR SLVR 16FR STAT (SET/KITS/TRAYS/PACK) ×1 IMPLANT
WRAP-ON POLOR PAD MULTI XL (MISCELLANEOUS) ×1
WRAPON POLOR PAD MULTI XL (MISCELLANEOUS) ×2

## 2019-12-05 NOTE — Anesthesia Postprocedure Evaluation (Signed)
Anesthesia Post Note  Patient: Justin Shannon.  Procedure(s) Performed: PATELLA TENDON REPAIR (Right Knee)  Patient location during evaluation: PACU Anesthesia Type: General Level of consciousness: awake and alert and oriented Pain management: pain level controlled Vital Signs Assessment: post-procedure vital signs reviewed and stable Respiratory status: spontaneous breathing, nonlabored ventilation and respiratory function stable Cardiovascular status: blood pressure returned to baseline and stable Postop Assessment: no signs of nausea or vomiting Anesthetic complications: no   No complications documented.   Last Vitals:  Vitals:   12/05/19 1532 12/05/19 1554  BP: 134/68   Pulse: 63 65  Resp: 18 17  Temp:    SpO2: 99% 98%    Last Pain:  Vitals:   12/05/19 1554  TempSrc:   PainSc: 7                  Tanise Russman

## 2019-12-05 NOTE — Op Note (Signed)
OPERATIVE NOTE  DATE OF SURGERY:  12/05/2019  PATIENT NAME:  Justin Shannon.   DOB: Jan 27, 1941  MRN: [id number]   PRE-OPERATIVE DIAGNOSIS: Right patellar tendon rupture status post total knee arthroplasty  POST-OPERATIVE DIAGNOSIS:  Same  PROCEDURE: Repair of right patellar tendon rupture  SURGEON:  Marciano Sequin., M.D.   ASSISTANT: [name], PA-C  ANESTHESIA: general  ESTIMATED BLOOD LOSS: 25 mL  FLUIDS REPLACED: 1000 mL of crystalloid  TOURNIQUET TIME: 90 minutes  DRAINS: Not utilized  IMPLANTS UTILIZED: Arthrex Internal brace knee ligament augmentation, 5.5 mm biocomposite suture anchor, 4.75 mm biocomposite suture anchor, 4 Fibertape sutures.  INDICATIONS FOR SURGERY: Voris Tigert. is a 79 y.o. year old male who underwent elective right total knee arthroplasty approximately 6 weeks ago.  While in a skilled nursing facility, a physical therapist was working on knee flexion and the patient felt a "pop.  He was unable to extend the knee and had gross giving way of the knee with any attempted weightbearing.  Ultrasound findings were consistent with rupture of the right patellar tendon.  After discussion of the risks and benefits of surgical intervention, the patient expressed understanding of the risks benefits and agree with plans for primary repair of the right patellar tendon rupture.   PROCEDURE IN DETAIL: The patient was brought into the operating room and, after adequate general endotracheal anesthesia was achieved, a tourniquet was placed on the patient's upper right thigh.  [name] right knee and leg were cleaned and prepped with alcohol and DuraPrep and draped in usual sterile fashion.  A "timeout" was performed as per usual protocol.  The right lower extremity was exsanguinated using an Esmarch, the tourniquet was inflated to 300 mmHg.  An anterior longitudinal incision made in line with the previous surgical incision.  Dissection was carried down to the patella  and the patellar tendon.  There was gross disruption of the mid substance of the patellar tendon with marked fraying of the interface.  A moderate hemarthrosis was evacuated.  Fiber tape was used to create 2 rows of sutures in a Krakw fashion along the inferior portion of the patellar tendon.  In a similar fashion, 2 fiber tapes were used to create 2 rows of sutures in a Krakw fashion.  The knee was placed in full extension and an end and repair was performed.  Next, the Arthrex Internal brace was utilized by inserting 2 4.75 mm bio composite suture anchors angle at 45 degrees into the patella.  The associated fiber tape was then advanced down the medial and lateral aspect of the patellar tendon and the knee was flexed to approximately 30 degrees.  A 4.75 mm bio composite suture anchor was inserted medially and a 5.5 mm bio composite suture anchor was inserted laterally.  Good strength was noted in excellent construct with the internal brace was achieved.  The tourniquet was deflated after total tourniquet time of 90 minutes.  Hemostasis was achieved using electrocautery.  The wound was irrigated with copious muscle normal saline with dilute antibiotic solution.  The wound was approximated layers using first #0 Vicryl followed by #2-0 Vicryl.  Skin was closed with skin staples.  Sterile dressing was applied followed by application of a knee range of motion brace locked in full extension.  The patient tolerated the procedure well.  He was transported to the recovery room in stable condition.  Karalina Tift P. Holley Bouche M.D.

## 2019-12-05 NOTE — H&P (Signed)
The patient has been re-examined, and the chart reviewed, and there have been no interval changes to the documented history and physical.    The risks, benefits, and alternatives have been discussed at length. The patient expressed understanding of the risks benefits and agreed with plans for surgical intervention.  Danyael Alipio P. Ysabel Cowgill, Jr. M.D.    

## 2019-12-05 NOTE — Plan of Care (Signed)
?  Problem: Education: ?Goal: Knowledge of the prescribed therapeutic regimen will improve ?Outcome: Progressing ?  ?Problem: Activity: ?Goal: Ability to avoid complications of mobility impairment will improve ?Outcome: Progressing ?  ?Problem: Pain Management: ?Goal: Pain level will decrease with appropriate interventions ?Outcome: Progressing ?  ?Problem: Skin Integrity: ?Goal: Will show signs of wound healing ?Outcome: Progressing ?  ?

## 2019-12-05 NOTE — Anesthesia Procedure Notes (Signed)
Procedure Name: Intubation Date/Time: 12/05/2019 11:34 AM Performed by: Aline Brochure, CRNA Pre-anesthesia Checklist: Patient identified, Emergency Drugs available, Suction available and Patient being monitored Patient Re-evaluated:Patient Re-evaluated prior to induction Oxygen Delivery Method: Circle system utilized Preoxygenation: Pre-oxygenation with 100% oxygen Induction Type: IV induction Ventilation: Mask ventilation without difficulty Laryngoscope Size: McGraph and 4 Grade View: Grade I Tube type: Oral Tube size: 7.5 mm Number of attempts: 1 Airway Equipment and Method: Stylet and Video-laryngoscopy Placement Confirmation: ETT inserted through vocal cords under direct vision,  positive ETCO2 and breath sounds checked- equal and bilateral Secured at: 22 cm Tube secured with: Tape Dental Injury: Teeth and Oropharynx as per pre-operative assessment

## 2019-12-05 NOTE — Anesthesia Preprocedure Evaluation (Signed)
Anesthesia Evaluation  Patient identified by MRN, date of birth, ID band Patient awake    Reviewed: Allergy & Precautions, NPO status , [name] Chart, lab work & pertinent test results  History of Anesthesia Complications Negative for: history of anesthetic complications  Airway Mallampati: III  TM Distance: >3 FB Neck ROM: Full    Dental  (+) Poor Dentition   Pulmonary sleep apnea and Continuous Positive Airway Pressure Ventilation , neg COPD, former smoker,    breath sounds clear to auscultation- rhonchi (-) wheezing      Cardiovascular hypertension, Pt. on medications + DVT (hx of DVT)  (-) CAD, (-) Past MI, (-) Cardiac Stents and (-) CABG  Rhythm:Regular Rate:Normal - Systolic murmurs and - Diastolic murmurs    Neuro/Psych Seizures -, Well Controlled,  negative psych ROS   GI/Hepatic negative GI ROS, Neg liver ROS,   Endo/Other  diabetes  Renal/GU CRFRenal disease     Musculoskeletal  (+) Arthritis ,   Abdominal (+) + obese,   Peds  Hematology  (+) anemia ,   Anesthesia Other Findings Past Medical History: No date: Arthritis No date: Bilateral swelling of feet No date: BPH (benign prostatic hyperplasia) No date: Chronic kidney disease No date: Diabetes mellitus without complication (HCC) No date: DVT (deep venous thrombosis) (HCC)     Comment:  right lower extremity No date: History of gout No date: Hypertension No date: Seizures (HCC) No date: Sleep apnea No date: Tendonitis of foot     Comment:  left   Reproductive/Obstetrics                             Anesthesia Physical Anesthesia Plan  ASA: III  Anesthesia Plan: General   Post-op Pain Management:    Induction: Intravenous  PONV Risk Score and Plan: 1 and Ondansetron and Dexamethasone  Airway Management Planned: Oral ETT  Additional Equipment:   Intra-op Plan:   Post-operative Plan: Extubation in  OR  Informed Consent: I have reviewed the patients History and Physical, chart, labs and discussed the procedure including the risks, benefits and alternatives for the proposed anesthesia with the patient or authorized representative who has indicated his/her understanding and acceptance.     Dental advisory given  Plan Discussed with: CRNA and Anesthesiologist  Anesthesia Plan Comments:         Anesthesia Quick Evaluation

## 2019-12-05 NOTE — Transfer of Care (Signed)
Immediate Anesthesia Transfer of Care Note  Patient: Justin Shannon.  Procedure(s) Performed: PATELLA TENDON REPAIR (Right Knee)  Patient Location: PACU  Anesthesia Type:General  Level of Consciousness: awake  Airway & Oxygen Therapy: Patient connected to face mask oxygen  Post-op Assessment: Post -op Vital signs reviewed and stable  Post vital signs: stable  Last Vitals:  Vitals Value Taken Time  BP 131/76   Temp    Pulse 79 12/05/19 1432  Resp 30 12/05/19 1432  SpO2 100 % 12/05/19 1432  Vitals shown include unvalidated device data.  Last Pain:  Vitals:   12/05/19 1002  TempSrc: Temporal  PainSc: 10-Worst pain ever         Complications: No complications documented.

## 2019-12-06 ENCOUNTER — Encounter: Payer: Self-pay | Admitting: Orthopedic Surgery

## 2019-12-06 ENCOUNTER — Other Ambulatory Visit: Payer: Self-pay

## 2019-12-06 LAB — GLUCOSE, CAPILLARY
Glucose-Capillary: 123 mg/dL — ABNORMAL HIGH (ref 70–99)
Glucose-Capillary: 104 mg/dL — ABNORMAL HIGH (ref 70–99)
Glucose-Capillary: 105 mg/dL — ABNORMAL HIGH (ref 70–99)
Glucose-Capillary: 109 mg/dL — ABNORMAL HIGH (ref 70–99)

## 2019-12-06 NOTE — NC FL2 (Signed)
[place] LEVEL OF CARE SCREENING TOOL     IDENTIFICATION  Patient Name: Justin Shannon. Birthdate: 1940/09/02 Sex: male Admission Date (Current Location): 12/05/2019  [place] and [place] Number:  Engineering geologist and Address:  [facility], [address] [place], [place] [address]      Provider Number: [id number]  Attending Physician Name and Address:  Dereck Leep, MD  Relative Name and Phone Number:  [name] daughter [phone]    Current Level of Care: Hospital Recommended Level of Care: [place] Prior Approval Number:    Date Approved/Denied:   PASRR Number: [id number]  Discharge Plan: SNF    Current Diagnoses: Patient Active Problem List   Diagnosis Date Noted  . Patellar tendon rupture, right, initial encounter 12/05/2019  . Total knee replacement status 11/02/2019  . PAD (peripheral artery disease) ([place]) 10/13/2019  . Hyperkalemia   . Acute kidney injury superimposed on CKD ([place])   . Anemia of chronic renal failure, stage 3a   . Benign prostatic hyperplasia   . Seizure ([place])   . Pneumonia due to COVID-19 virus 06/24/2019  . Respiratory failure with hypoxia ([place]) 06/24/2019  . Anemia due to stage 4 chronic kidney disease ([place]) 06/24/2019  . Essential hypertension 06/24/2019  . Generalized weakness 06/24/2019  . Primary osteoarthritis of right knee 04/03/2019  . Venous ulcer of ankle, right ([place]) 05/08/2018  . Use of cane as ambulatory aid 04/27/2018  . Type 2 diabetes mellitus with stage 4 chronic kidney disease, without long-term current use of insulin ([place]) 06/11/2017  . Lymphedema 05/04/2016  . Chronic venous insufficiency 05/04/2016  . Toe sprain 01/24/2016  . Health care maintenance 12/11/2014  . Morbid (severe) obesity due to excess calories ([place]) 05/24/2014  . Morbid obesity with body mass index of 40.0-44.9 in adult [facility]) 05/24/2014  . OSA on CPAP 01/10/2014  . Chronic kidney  disease (CKD), stage III (moderate) 11/22/2013  . Allergic rhinitis 11/06/2013  . Benign hypertension with chronic kidney disease, stage III ([place]) 11/06/2013  . Epilepsy ([place]) 11/06/2013  . Gout 11/06/2013  . History of DVT (deep vein thrombosis) 11/06/2013  . Nocturnal seizures ([place]) 11/06/2013  . 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) 11/06/2013    Orientation RESPIRATION BLADDER Height & Weight     Self, Time, Situation, Place  Normal Continent Weight: 121.6 kg Height:  5\' 11"  (180.3 cm)  BEHAVIORAL SYMPTOMS/MOOD NEUROLOGICAL BOWEL NUTRITION STATUS      Continent Diet  AMBULATORY STATUS COMMUNICATION OF NEEDS Skin   Extensive Assist Verbally Normal                       Personal Care Assistance Level of Assistance  Bathing, Dressing Bathing Assistance: Limited assistance   Dressing Assistance: Limited assistance     Functional Limitations Info             SPECIAL CARE FACTORS FREQUENCY  PT (By licensed PT)     PT Frequency: 5 times per week              Contractures Contractures Info: Not present    Additional Factors Info  Allergies Code Status Info: full code Allergies Info: Shellfish Allergy, Iodinated Diagnostic Agents, Nsaids           Current Medications (12/06/2019):  This is the current hospital active medication list Current Facility-Administered Medications  Medication Dose Route Frequency Provider Last Rate Last Admin  .  0.9 %  sodium chloride infusion   Intravenous Continuous Hooten, Laurice Record, MD 100 mL/hr at 12/06/19 1236 New Bag at 12/06/19 1236  . acetaminophen (TYLENOL) tablet 325-650 mg  325-650 mg Oral Q6H PRN Hooten, Laurice Record, MD      . albuterol (PROVENTIL) (2.5 MG/3ML) 0.083% nebulizer solution 2.5 mg  2.5 mg Inhalation Q4H PRN Hooten, Laurice Record, MD      . allopurinol (ZYLOPRIM) tablet 100 mg  100 mg Oral Daily Dereck Leep, MD   100 mg at 12/06/19 0814  . alum & mag hydroxide-simeth  (MAALOX/MYLANTA) 200-200-20 MG/5ML suspension 30 mL  30 mL Oral Q4H PRN Hooten, Laurice Record, MD      . ascorbic acid (VITAMIN C) tablet 500 mg  500 mg Oral Daily Hooten, Laurice Record, MD   500 mg at 12/06/19 0809  . bisacodyl (DULCOLAX) suppository 10 mg  10 mg Rectal Daily PRN Hooten, Laurice Record, MD      . diltiazem (CARDIZEM CD) 24 hr capsule 120 mg  120 mg Oral Daily Hooten, Laurice Record, MD   120 mg at 12/06/19 0813  . diphenhydrAMINE (BENADRYL) 12.5 MG/5ML elixir 12.5-25 mg  12.5-25 mg Oral Q4H PRN Hooten, Laurice Record, MD      . ferrous sulfate tablet 325 mg  325 mg Oral BID WC Hooten, Laurice Record, MD   325 mg at 12/06/19 0809  . fluticasone (FLONASE) 50 MCG/ACT nasal spray 2 spray  2 spray Each Nare Daily PRN Hooten, Laurice Record, MD      . irbesartan (AVAPRO) tablet 300 mg  300 mg Oral Daily Hooten, Laurice Record, MD   300 mg at 12/06/19 0810   And  . hydrochlorothiazide (MICROZIDE) capsule 12.5 mg  12.5 mg Oral Daily Dereck Leep, MD   12.5 mg at 12/06/19 0810  . HYDROmorphone (DILAUDID) injection 0.5-1 mg  0.5-1 mg Intravenous Q4H PRN Hooten, Laurice Record, MD      . insulin aspart (novoLOG) injection 0-15 Units  0-15 Units Subcutaneous TID WC Hooten, Laurice Record, MD   2 Units at 12/06/19 [phone]  . levETIRAcetam (KEPPRA) tablet 500 mg  500 mg Oral BID Dereck Leep, MD   500 mg at 12/06/19 0814  . loratadine (CLARITIN) tablet 10 mg  10 mg Oral Daily Hooten, Laurice Record, MD      . magnesium hydroxide (MILK OF MAGNESIA) suspension 30 mL  30 mL Oral Daily Hooten, Laurice Record, MD   30 mL at 12/06/19 0810  . menthol-cetylpyridinium (CEPACOL) lozenge 3 mg  1 lozenge Oral PRN Hooten, Laurice Record, MD       Or  . phenol (CHLORASEPTIC) mouth spray 1 spray  1 spray Mouth/Throat PRN Hooten, Laurice Record, MD      . metoCLOPramide (REGLAN) tablet 5-10 mg  5-10 mg Oral Q8H PRN Hooten, Laurice Record, MD       Or  . metoCLOPramide (REGLAN) injection 5-10 mg  5-10 mg Intravenous Q8H PRN Hooten, Laurice Record, MD      . metoprolol succinate (TOPROL-XL) 24 hr tablet 50 mg  50  mg Oral Daily Hooten, Laurice Record, MD   50 mg at 12/06/19 0808  . mometasone-formoterol (DULERA) 200-5 MCG/ACT inhaler 2 puff  2 puff Inhalation BID Hooten, Laurice Record, MD   2 puff at 12/06/19 0811  . multivitamin with minerals tablet 1 tablet  1 tablet Oral Daily Hooten, Laurice Record, MD   1 tablet at 12/06/19 0810  . ondansetron (ZOFRAN) tablet 4 mg  4 mg Oral Q6H PRN Hooten, Laurice Record, MD       Or  . ondansetron (ZOFRAN) injection 4 mg  4 mg Intravenous Q6H PRN Hooten, Laurice Record, MD      . oxyCODONE (Oxy IR/ROXICODONE) immediate release tablet 10 mg  10 mg Oral Q4H PRN Hooten, Laurice Record, MD      . oxyCODONE (Oxy IR/ROXICODONE) immediate release tablet 5 mg  5 mg Oral Q4H PRN Hooten, Laurice Record, MD   5 mg at 12/05/19 1631  . pantoprazole (PROTONIX) EC tablet 40 mg  40 mg Oral BID Dereck Leep, MD   40 mg at 12/06/19 0809  . rivaroxaban (XARELTO) tablet 20 mg  20 mg Oral Daily Hooten, Laurice Record, MD   20 mg at 12/06/19 0814  . sodium phosphate (FLEET) 7-19 GM/118ML enema 1 enema  1 enema Rectal Once PRN Hooten, Laurice Record, MD      . torsemide (DEMADEX) tablet 20 mg  20 mg Oral Daily Hooten, Laurice Record, MD   20 mg at 12/06/19 6861     Discharge Medications: Please see discharge summary for a list of discharge medications.  Relevant Imaging Results:  Relevant Lab Results:   Additional Information UOH:[id number]  Su Hilt, RN

## 2019-12-06 NOTE — TOC Progression Note (Signed)
Transition of Care (TOC) - Progression Note    Patient Details  Name: Justin Shannon. MRN: [id number] Date of Birth: [birth year]  Transition of Care [facility]) CM/SW Contact  Su Hilt, RN Phone Number: 12/06/2019, 3:07 PM  Clinical Narrative:     Met with the patient to discuss [place] and needs He has recently been to [facility] and does not want to go back to that facility, I explained the bed search process and he agreed to do a bed search then once bed offers are obtained we will discuss them and get a bed choice, FL2, PASSR and bedsearch done  Expected Discharge Plan: [place] Barriers to Discharge: Continued Medical Work up, SNF Pending bed offer  Expected Discharge Plan and Services Expected Discharge Plan: [place] arrangements for the past 2 months: Single Family Home                                       Social Determinants of Health (SDOH) Interventions    Readmission Risk Interventions No flowsheet data found.

## 2019-12-06 NOTE — Progress Notes (Signed)
Physical Therapy Treatment Patient Details Name: Justin Shannon. MRN: [id number] DOB: 14-Mar-1941 Today's Date: 12/06/2019    History of Present Illness 79 y/o male s/p R TKA 11/02/19, while at rehab he suffered patellar tendon rupture and is now s/p patellar repair.    PT Comments    Pt was seated in recliner upon arriving. He agrees to PT session and is requesting to return to bed. Reports 6/10 pain. Pt was unable to recall proper wt bearing on RLE. He was re-educated on proper wt bearing and what to expect with PT going forward. Pt was able to stand pivot to EOB from recliner with mod assist of one with 2nd person close for safety. Sat EOB x several minutes performing exercises prior to max assist to return/reposition to [facility] in supine. Therapist discussed importance of performing there ex in bed. He states understanding. Pt will benefit from SNF at DC to address strength, balance, endurance, and safe functional mobility deficits. At conclusion of PT session; pt was supine in bed with knee brace locked in ext, call bell in reach, ice polar applied, and bed alarm in place. He tolerated session well. Acute PT will continue to progress as able per POC.     Follow Up Recommendations  SNF;Supervision for mobility/OOB     Equipment Recommendations  None recommended by PT    Recommendations for Other Services       Precautions / Restrictions Precautions Precautions: Fall Required Braces or Orthoses: Knee Immobilizer - Right Restrictions Weight Bearing Restrictions: Yes RLE Weight Bearing: Partial weight bearing    Mobility  Bed Mobility Overal bed mobility: Needs Assistance Bed Mobility: Sit to Supine       Sit to supine: Max assist   General bed mobility comments: Max assist to return to supine from EOB sitting. vcs for technique and sequencing. Therapist assisted BLEs into bed  Transfers Overall transfer level: Needs assistance Equipment used: Rolling walker (2  wheeled) Transfers: Sit to/from [facility] Sit to Stand: +2 safety/equipment;Mod assist         General transfer comment: Mod assist with 2nd person for safety. Vcs for handplacement and incraesed fwd wt shift to stand. pt struggles limiting wt bearing on RLE even with max vcs. he was able to pivot to L to EOB once standing with increased time and vcs for sequencing.  Ambulation/Gait Ambulation/Gait assistance: Mod assist Gait Distance (Feet): 3 Feet Assistive device: Rolling walker (2 wheeled)   Gait velocity: decreased   General Gait Details: pt able to pivot to EOB with increased time mod assist + max vcs   Stairs             Wheelchair Mobility    Modified Rankin (Stroke Patients Only)       Balance                                            Cognition Arousal/Alertness: Awake/alert Behavior During Therapy: Anxious Overall Cognitive Status: Within Functional Limits for tasks assessed                                 General Comments: pt very hesitant and requires increased time throughout to process and perform desired task      Exercises      General Comments  Pertinent Vitals/Pain Pain Assessment: 0-10 Pain Score: 6  Pain Location: R anterior knee Pain Intervention(s): Limited activity within patient's tolerance;Monitored during session;Premedicated before session;Repositioned;Ice applied    Home Living                      Prior Function            PT Goals (current goals can now be found in the care plan section) Acute Rehab PT Goals Patient Stated Goal: get back to walking Progress towards PT goals: Progressing toward goals    Frequency    BID      PT Plan Current plan remains appropriate    Co-evaluation              AM-PAC PT "6 Clicks" Mobility   Outcome Measure  Help needed turning from your back to your side while in a flat bed without using  bedrails?: A Little Help needed moving from lying on your back to sitting on the side of a flat bed without using bedrails?: A Lot Help needed moving to and from a bed to a chair (including a wheelchair)?: A Lot Help needed standing up from a chair using your arms (e.g., wheelchair or bedside chair)?: A Lot Help needed to walk in hospital room?: Total Help needed climbing 3-5 steps with a railing? : Total 6 Click Score: 11    End of Session Equipment Utilized During Treatment: Gait belt;Right knee immobilizer Activity Tolerance: Patient limited by pain;Patient limited by fatigue Patient left: in bed;with call bell/phone within reach;with bed alarm set;with nursing/sitter in room;with SCD's reapplied Nurse Communication: Mobility status PT Visit Diagnosis: Muscle weakness (generalized) (M62.81);Pain;Difficulty in walking, not elsewhere classified (R26.2);Unsteadiness on feet (R26.81) Pain - Right/Left: Right Pain - part of body: Knee     Time: 7544-9201 PT Time Calculation (min) (ACUTE ONLY): 25 min  Charges:  $Therapeutic Exercise: 8-22 mins $Therapeutic Activity: 8-22 mins                     Julaine Fusi PTA 12/06/19, 2:19 PM

## 2019-12-06 NOTE — Progress Notes (Signed)
ORTHOPAEDICS PROGRESS NOTE  PATIENT NAME: Justin Shannon. DOB: [birth year]  MRN: [id number]  POD # 1: Repair of right patellar tendon rupture, status post right total knee arthroplasty  Subjective: The patient rested well last evening.  He states that pain has been under good control.  Objective: Vital signs in last 24 hours: Temp:  [97.1 F (36.2 C)-98.1 F (36.7 C)] 98 F (36.7 C) (06/15 0719) Pulse Rate:  [63-85] 82 (06/15 0719) Resp:  [9-30] 17 (06/15 0719) BP: (126-151)/(54-76) 143/59 (06/15 0719) SpO2:  [95 %-100 %] 100 % (06/15 0719) Weight:  [121.6 kg] 121.6 kg (06/14 1002)  Intake/Output from previous day: 06/14 0701 - 06/15 0700 In: 1053.2 [P.O.:50; I.V.:653.2; IV Piggyback:350] Out: 950 [Urine:925; Blood:25]  No results for input(s): WBC, HGB, HCT, PLT, K, CL, CO2, BUN, CREATININE, GLUCOSE, CALCIUM, LABPT, INR in the last 72 hours.  EXAM General: Well-developed well-nourished male seen in no apparent discomfort. Lungs: clear to auscultation Cardiac: normal rate and regular rhythm Right lower extremity: Knee range of motion brace is in place and locked in extension.  Polar Care is in place and functioning.  Homans test is negative. Neurologic: Awake, alert, and oriented.  Sensory and motor function are grossly intact.  Assessment: Right patellar tendon repair (midsubstance) Status post right total knee arthroplasty  Secondary diagnoses: Sleep apnea Seizures Hypertension Gout Diabetes Chronic kidney disease Benign prostatic hypertrophy History of DVT  Plan: The patient may be partial weightbearing.  Brace should be maintained at all times. TED hose were not in place this morning.  The nurse was contacted to place TED hose as ordered. Begin physical therapy and Occupational Therapy. The family was unable to adequately care for the patient at home and he will definitely require placement in a skilled nursing facility. Plan is to go Skilled nursing  facility after hospital stay. DVT Prophylaxis - Xarelto, Foot Pumps and TED hose  Virgie Chery P. Holley Bouche M.D.

## 2019-12-06 NOTE — Evaluation (Signed)
Physical Therapy Evaluation Patient Details Name: Justin Shannon. MRN: [id number] DOB: March 27, 1941 Today's Date: 12/06/2019   History of Present Illness  79 y/o male s/p R TKA 11/02/19, while at rehab he suffered patellar tendon rupture and is now s/p patellar repair.  Clinical Impression  Pt anxious about most aspects of PT but was willing to participate with some extra reinforcement, cuing and regular rest breaks.  He struggled with mobility and though he was able to manage some very small and labored side/turning steps to get to the recliner he certainly was not confident and needed constant assist to keep weight forward with +2 mod assist regularly required.  He needs extra time and clarification with exercises, mobility and safety but again was willing to participate.  Pt will require STR at discharge.    Follow Up Recommendations SNF;Supervision for mobility/OOB    Equipment Recommendations  None recommended by PT    Recommendations for Other Services       Precautions / Restrictions Precautions Precautions: Fall Required Braces or Orthoses: Knee Immobilizer - Right Restrictions Weight Bearing Restrictions: Yes RLE Weight Bearing: Partial weight bearing      Mobility  Bed Mobility Overal bed mobility: Needs Assistance Bed Mobility: Supine to Sit     Supine to sit: Mod assist     General bed mobility comments: Pt showed some effort with ability to partially get R LE toward EOB, ultimatley needed consistent assist t/o the transition to EOB sitting with heavy assist to get trunk to upright, pt needing assist to scoot to EOB and plenty of cuing to insure he kept weight forward  Transfers Overall transfer level: Needs assistance Equipment used: Rolling walker (2 wheeled) Transfers: Sit to/from Stand Sit to Stand: Mod assist;Max assist         General transfer comment: Pt struggled to get fully forward and over the walker.  With heavy verbal and direct cuing he was  able to shift to weight onto the walker with some consistency but still had issues with in ability to fully shift weight over the walker, needing constant min to mod assist to keep from fallling/leaning back  Ambulation/Gait Ambulation/Gait assistance: Mod assist Gait Distance (Feet): 4 Feet Assistive device: Rolling walker (2 wheeled)       General Gait Details: Pt was able to take a few very small, guarded and anxious steps.  Heavy cuing and encouragement needed t/o the transition with poor tolerance, minimal ability to effectively shift weight onto the walker (both for static standing and minimal heel-toe shuffing/stepping  Stairs            Wheelchair Mobility    Modified Rankin (Stroke Patients Only)       Balance Overall balance assessment: Needs assistance Sitting-balance support: Bilateral upper extremity supported;Feet supported (R LE in KI, not on floor at EOB) Sitting balance-Leahy Scale: Fair Sitting balance - Comments: Pt with hunched posture, nearly leaning backward and needing consistent VCs and at time phyiscal assist to keep weight/posture forward     Standing balance-Leahy Scale: Poor Standing balance comment: Pt needed +2 min/mod assist the entire time in standing to insure he did not lose balance backward, constant cuing to keep weight forward and over walker                             Pertinent Vitals/Pain Pain Assessment: 0-10 Pain Score: 7  Pain Location: R anterior knee    Home Living  Family/patient expects to be discharged to:: Skilled nursing facility Living Arrangements: Spouse/significant other;Children                    Prior Function Level of Independence:  (reports he had been walking up to ~75 ft at rehab recently)               Hand Dominance        Extremity/Trunk Assessment   Upper Extremity Assessment Upper Extremity Assessment: Generalized weakness (R grossly 4-/5, L grossly 3+/5)    Lower  Extremity Assessment Lower Extremity Assessment:  (R in KI, unable to SLR, fair quad set, poor tolerance)       Communication   Communication: No difficulties  Cognition Arousal/Alertness: Awake/alert Behavior During Therapy: Anxious Overall Cognitive Status: Within Functional Limits for tasks assessed                                 General Comments: Pt needing reinforcement with most exercises, movement, etc with encouragement to push through his hesitancy      General Comments General comments (skin integrity, edema, etc.): Pt appeared to maintain PWBing (likely more secondary to fear and pain than actual voluntary compliance)    Exercises General Exercises - Lower Extremity Ankle Circles/Pumps: 10 reps;Strengthening;AROM Quad Sets: Strengthening;10 reps Hip ABduction/ADduction: AAROM;10 reps (c/o pain with each rep, needing multiple rest breaks) Straight Leg Raises: PROM;5 reps (c/o pain with each rep)   Assessment/Plan    PT Assessment Patient needs continued PT services  PT Problem List Decreased strength;Decreased range of motion;Decreased activity tolerance;Decreased balance;Decreased mobility;Decreased coordination;Decreased cognition;Decreased knowledge of use of DME;Decreased safety awareness;Pain       PT Treatment Interventions DME instruction;Gait training;Functional mobility training;Therapeutic activities;Therapeutic exercise;Balance training;Neuromuscular re-education;Patient/family education    PT Goals (Current goals can be found in the Care Plan section)  Acute Rehab PT Goals Patient Stated Goal: get back to walking PT Goal Formulation: With patient Time For Goal Achievement: 12/20/19 Potential to Achieve Goals: Good    Frequency BID   Barriers to discharge        Co-evaluation               AM-PAC PT "6 Clicks" Mobility  Outcome Measure Help needed turning from your back to your side while in a flat bed without using  bedrails?: A Little Help needed moving from lying on your back to sitting on the side of a flat bed without using bedrails?: A Lot Help needed moving to and from a bed to a chair (including a wheelchair)?: A Lot Help needed standing up from a chair using your arms (e.g., wheelchair or bedside chair)?: A Lot Help needed to walk in hospital room?: Total Help needed climbing 3-5 steps with a railing? : Total 6 Click Score: 11    End of Session Equipment Utilized During Treatment: Gait belt;Right knee immobilizer Activity Tolerance: Patient limited by pain Patient left: with chair alarm set;with call bell/phone within reach Nurse Communication: Mobility status PT Visit Diagnosis: Muscle weakness (generalized) (M62.81);Pain;Difficulty in walking, not elsewhere classified (R26.2);Unsteadiness on feet (R26.81) Pain - Right/Left: Right Pain - part of body: Knee    Time: 0830-0905 PT Time Calculation (min) (ACUTE ONLY): 35 min   Charges:   PT Evaluation $PT Eval Low Complexity: 1 Low PT Treatments $Therapeutic Exercise: 8-22 mins $Therapeutic Activity: 8-22 mins        Kreg [place], DPT  12/06/2019, 10:22 AM

## 2019-12-06 NOTE — Evaluation (Signed)
Occupational Therapy Evaluation Patient Details Name: Justin Shannon. 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   Clinical Impression   Pt was seen for OT evaluation this date. Prior to hospital admission, pt was at SNF recovering from previous surgery, was apparently able to walk with MOD I with no physical assist prior to that, but some difficulty with transfers such as in/out of car. Pt lives at home with spouse and children typically/before previous adm. Currently pt demonstrates impairments as described below (See OT problem list) which functionally limit his ability to perform ADL/self-care tasks. Pt currently requires MAX A with LB ADLs, setup to MIN A with UB ADLs and cannot tolerate transfers/transition OOB on OT assessment, citing pain.  Pt would benefit from skilled OT to address noted impairments and functional limitations (see below for any additional details) in order to maximize safety and independence while minimizing falls risk and caregiver burden. Upon hospital discharge, recommend STR to maximize pt safety and return to PLOF.     Follow Up Recommendations  SNF    Equipment Recommendations  Other (comment) (defer to next venue of care)    Recommendations for Other Services       Precautions / Restrictions Precautions Precautions: Fall Required Braces or Orthoses: Knee Immobilizer - Right Restrictions Weight Bearing Restrictions: Yes RLE Weight Bearing: Partial weight bearing      Mobility Bed Mobility Overal bed mobility: Needs Assistance Bed Mobility: Rolling Rolling: Mod assist       General bed mobility comments: MIN/MOD verbal cues for hand placement with lateral rolling in bed. Attempted sup to sit, but pt with increase of knee pain from 2/10 to 10/10 with attempts to advance toward EOB. RN  notfiied.  Transfers         General transfer comment: NT on OT evaluation, pt with c/o pain increase with attempt to even come to EOB sitting to 10/10, cannot tolerate.    Balance       Sitting balance - Comments: NT       Standing balance comment: NT                           ADL either performed or assessed with clinical judgement   ADL Overall ADL's : Needs assistance/impaired                                       General ADL Comments: setup with UB ADLs bed level with HOB elevated. Pt requires MAX A with LB ADLs. too much pain to attempt unsupported sitting or standing ADLs on OT assessment.     Vision Patient Visual Report: No change from baseline       Perception     Praxis      Pertinent Vitals/Pain Pain Assessment: 0-10 Pain Score: 10-Worst pain ever Pain Location: R anterior knee with mobilization (states 2/10 w/o mob) Pain Descriptors / Indicators: Aching;Grimacing;Operative site guarding Pain Intervention(s): Limited activity within patient's tolerance;Monitored during session;Ice applied;Patient requesting pain meds-RN notified     Hand Dominance     Extremity/Trunk Assessment Upper Extremity Assessment Upper Extremity Assessment: Generalized weakness (L grossly 3+/5 t/o and R grossly 4-/5 t/o)   Lower Extremity Assessment Lower  Extremity Assessment: RLE deficits/detail RLE Deficits / Details: KI RLE: Unable to fully assess due to pain;Unable to fully assess due to immobilization       Communication Communication Communication: No difficulties   Cognition Arousal/Alertness: Awake/alert Behavior During Therapy: Anxious;Flat affect Overall Cognitive Status: Within Functional Limits for tasks assessed                                 General Comments: pt requires increased encouragement to participate. Is pleasant throughout.   General Comments       Exercises Other Exercises: OT facilitates  education re: role of OT, modifications for LB ADLs, use of call light. Pt with good understanding, LB ADL modification training familiar to pt from previous admission. Other Exercises: OT educates re: importance of OOB activity.   Shoulder Instructions      Home Living Family/patient expects to be discharged to:: Skilled nursing facility Living Arrangements: Spouse/significant other;Children                                      Prior Functioning/Environment          Comments: Per previous OT evaluation from last adm post-op 10/2019: Pt reports that he uses a Rollator for household and community ambulation. He still has his license but does not drive often due to pain getting in and out of car. He has had some previous difficulties getting in and out of his tub/shower because of pain in his RLE. Wife reports he did need occasional assistance with ADL.        OT Problem List: Decreased strength;Decreased range of motion;Decreased activity tolerance;Impaired balance (sitting and/or standing);Decreased knowledge of use of DME or AE;Pain      OT Treatment/Interventions: Self-care/ADL training;Therapeutic exercise;Energy conservation;DME and/or AE instruction;Therapeutic activities;Patient/family education;Balance training    OT Goals(Current goals can be found in the care plan section) Acute Rehab OT Goals Patient Stated Goal: get back to walking OT Goal Formulation: With patient Time For Goal Achievement: 12/20/19 Potential to Achieve Goals: Good  OT Frequency: Min 2X/week   Barriers to D/C:            Co-evaluation              AM-PAC OT "6 Clicks" Daily Activity     Outcome Measure Help from another person eating meals?: None Help from another person taking care of personal grooming?: A Little Help from another person toileting, which includes using toliet, bedpan, or urinal?: A Lot Help from another person bathing (including washing, rinsing, drying)?:  A Lot Help from another person to put on and taking off regular upper body clothing?: A Little Help from another person to put on and taking off regular lower body clothing?: A Lot 6 Click Score: 16   End of Session    Activity Tolerance: Patient limited by pain Patient left: in bed;with call bell/phone within reach;with bed alarm set  OT Visit Diagnosis: Unsteadiness on feet (R26.81);Muscle weakness (generalized) (M62.81);Pain Pain - Right/Left: Right Pain - part of body: Knee                Time: 1529-1601 OT Time Calculation (min): 32 min Charges:  OT General Charges $OT Visit: 1 Visit OT Evaluation $OT Eval Moderate Complexity: 1 Mod OT Treatments $Self Care/Home Management : 8-22 mins  Gerrianne Scale, MS, OTR/L  ascom [phone] 12/06/19, 4:16 PM

## 2019-12-07 LAB — BASIC METABOLIC PANEL
Anion gap: 7 (ref 5–15)
BUN: 52 mg/dL — ABNORMAL HIGH (ref 8–23)
CO2: 27 mmol/L (ref 22–32)
Calcium: 9.2 mg/dL (ref 8.9–10.3)
Chloride: 104 mmol/L (ref 98–111)
Creatinine, Ser: 2.02 mg/dL — ABNORMAL HIGH (ref 0.61–1.24)
GFR calc Af Amer: 36 mL/min — ABNORMAL LOW (ref >60)
GFR calc non Af Amer: 31 mL/min — ABNORMAL LOW (ref >60)
Glucose, Bld: 103 mg/dL — ABNORMAL HIGH (ref 70–99)
Potassium: 3.4 mmol/L — ABNORMAL LOW (ref 3.5–5.1)
Sodium: 138 mmol/L (ref 135–145)

## 2019-12-07 LAB — GLUCOSE, CAPILLARY
Glucose-Capillary: 88 mg/dL (ref 70–99)
Glucose-Capillary: 81 mg/dL (ref 70–99)
Glucose-Capillary: 125 mg/dL — ABNORMAL HIGH (ref 70–99)
Glucose-Capillary: 61 mg/dL — ABNORMAL LOW (ref 70–99)
Glucose-Capillary: 94 mg/dL (ref 70–99)

## 2019-12-07 LAB — CBC WITH DIFFERENTIAL/PLATELET
Abs Immature Granulocytes: 0.06 10*3/uL (ref 0.00–0.07)
Basophils Absolute: 0 10*3/uL (ref 0.0–0.1)
Basophils Relative: 0 %
Eosinophils Absolute: 0.1 10*3/uL (ref 0.0–0.5)
Eosinophils Relative: 1 %
HCT: 26.6 % — ABNORMAL LOW (ref 39.0–52.0)
Hemoglobin: 8.5 g/dL — ABNORMAL LOW (ref 13.0–17.0)
Immature Granulocytes: 1 %
Lymphocytes Relative: 16 %
Lymphs Abs: 1.8 10*3/uL (ref 0.7–4.0)
MCH: 22.8 pg — ABNORMAL LOW (ref 26.0–34.0)
MCHC: 32 g/dL (ref 30.0–36.0)
MCV: 71.5 fL — ABNORMAL LOW (ref 80.0–100.0)
Monocytes Absolute: 1.1 10*3/uL — ABNORMAL HIGH (ref 0.1–1.0)
Monocytes Relative: 11 %
Neutro Abs: 7.7 10*3/uL (ref 1.7–7.7)
Neutrophils Relative %: 71 %
Platelets: 158 10*3/uL (ref 150–400)
RBC: 3.72 MIL/uL — ABNORMAL LOW (ref 4.22–5.81)
RDW: 18.7 % — ABNORMAL HIGH (ref 11.5–15.5)
WBC: 10.8 10*3/uL — ABNORMAL HIGH (ref 4.0–10.5)
nRBC: 0 % (ref 0.0–0.2)

## 2019-12-07 LAB — URINALYSIS, ROUTINE W REFLEX MICROSCOPIC
Bacteria, UA: NONE SEEN
Bilirubin Urine: NEGATIVE
Glucose, UA: NEGATIVE mg/dL
Ketones, ur: NEGATIVE mg/dL
Nitrite: NEGATIVE
Protein, ur: NEGATIVE mg/dL
Specific Gravity, Urine: 1.006 (ref 1.005–1.030)
Squamous Epithelial / HPF: NONE SEEN (ref 0–5)
pH: 5 (ref 5.0–8.0)

## 2019-12-07 LAB — SARS CORONAVIRUS 2 BY RT PCR (HOSPITAL ORDER, PERFORMED IN ~~LOC~~ HOSPITAL LAB): SARS Coronavirus 2: NEGATIVE

## 2019-12-07 MED ORDER — TAMSULOSIN HCL 0.4 MG PO CAPS
0.4000 mg | ORAL_CAPSULE | Freq: Every day | ORAL | Status: DC
  Administered 2019-12-07 – 2019-12-08 (×2): 0.4 mg via ORAL
  Filled 2019-12-07 (×2): qty 1

## 2019-12-07 MED ORDER — POTASSIUM CHLORIDE CRYS ER 20 MEQ PO TBCR
20.0000 meq | EXTENDED_RELEASE_TABLET | Freq: Two times a day (BID) | ORAL | Status: DC
  Administered 2019-12-07 – 2019-12-08 (×2): 20 meq via ORAL
  Filled 2019-12-07 (×2): qty 1

## 2019-12-07 NOTE — Progress Notes (Signed)
Physical Therapy Treatment Patient Details Name: Justin Shannon. 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    PT Comments    Pt was long sitting in bed upon arriving. Therapist had attempted to mobilize patient earlier however pt was unable to tolerate movements 2/2 to pain. He was agreeable and reports improved pain from earlier attempt. Was able to tolerate exercises in bed prior to OOB activity. He required mod-max assist to exit bed with assistance with RLE and trunk support. Sat EOB x several minutes prior to performing stand pivot to chair. Max assist to stand. Pt has poor ability to maintain proper PWB. He fatigued quickly with minimal activity. Pt was seated in recliner with call bell in reach and chair alarm in place. Acute PT will continue to follow per POC.     Follow Up Recommendations  SNF     Equipment Recommendations  None recommended by PT    Recommendations for Other Services       Precautions / Restrictions Precautions Precautions: Fall Restrictions Weight Bearing Restrictions: Yes RLE Weight Bearing: Partial weight bearing Other Position/Activity Restrictions: knee locked in extension    Mobility  Bed Mobility Overal bed mobility: Needs Assistance       Supine to sit: Mod assist;HOB elevated;Max assist     General bed mobility comments: mod/max assist to exit L side of bed with increased time and assistance with LE progression and trunk assist  Transfers Overall transfer level: Needs assistance Equipment used: Rolling walker (2 wheeled) Transfers: Stand Pivot Transfers Sit to Stand: Max assist;From elevated surface Stand pivot transfers: Max assist;From elevated surface       General transfer comment: Pt was able to perform stand pivot from EOB to recliner with max assist + max vcs. pt  struggles with adhering to proper PWB  Ambulation/Gait Ambulation/Gait assistance: Min assist Gait Distance (Feet): 3 Feet Assistive device: Rolling walker (2 wheeled) Gait Pattern/deviations: Antalgic;Step-to pattern;Trunk flexed;Narrow base of support Gait velocity: decreased   General Gait Details: pt was only able to tolerate taking steps to turn to recliner form EOB. poor ability to maintain PWB.   Stairs             Wheelchair Mobility    Modified Rankin (Stroke Patients Only)       Balance Overall balance assessment: Needs assistance Sitting-balance support: Bilateral upper extremity supported;Feet supported Sitting balance-Leahy Scale: Fair     Standing balance support: Bilateral upper extremity supported;During functional activity Standing balance-Leahy Scale: Poor Standing balance comment: pt continues to be high fall risk                            Cognition Arousal/Alertness: Awake/alert Behavior During Therapy: Anxious;Flat affect Overall Cognitive Status: Impaired/Different from baseline                                 General Comments: Pt overall is oriented but has episodes of confusion throughout. he was able to follow commands throughout but requires increased time      Exercises General Exercises - Lower Extremity Ankle Circles/Pumps: 10 reps;Strengthening;AROM Quad Sets: Strengthening;10 reps Hip ABduction/ADduction: AAROM;10 reps Straight Leg Raises: AAROM;10 reps    General Comments  Pertinent Vitals/Pain Pain Assessment: 0-10 Pain Score: 6  Pain Location: R LE Pain Descriptors / Indicators: Aching;Grimacing;Operative site guarding Pain Intervention(s): Limited activity within patient's tolerance;Monitored during session;Premedicated before session;Repositioned;Ice applied    Home Living                      Prior Function            PT Goals (current goals can now be found in the  care plan section) Acute Rehab PT Goals Patient Stated Goal: " I want to get stronger, I'll be better if my pain was better" Progress towards PT goals: Progressing toward goals    Frequency    BID      PT Plan Current plan remains appropriate    Co-evaluation              AM-PAC PT "6 Clicks" Mobility   Outcome Measure  Help needed turning from your back to your side while in a flat bed without using bedrails?: A Lot Help needed moving from lying on your back to sitting on the side of a flat bed without using bedrails?: A Lot Help needed moving to and from a bed to a chair (including a wheelchair)?: A Lot Help needed standing up from a chair using your arms (e.g., wheelchair or bedside chair)?: A Lot Help needed to walk in hospital room?: Total Help needed climbing 3-5 steps with a railing? : Total 6 Click Score: 10    End of Session Equipment Utilized During Treatment: Gait belt;Right knee immobilizer Activity Tolerance: Patient limited by pain;Patient limited by fatigue Patient left: in chair;with call bell/phone within reach;with chair alarm set;with family/visitor present Nurse Communication: Mobility status PT Visit Diagnosis: Muscle weakness (generalized) (M62.81);Pain;Difficulty in walking, not elsewhere classified (R26.2);Unsteadiness on feet (R26.81) Pain - Right/Left: Right Pain - part of body: Knee     Time: 1040-1103 PT Time Calculation (min) (ACUTE ONLY): 23 min  Charges:  $Therapeutic Exercise: 8-22 mins $Therapeutic Activity: 8-22 mins                     Julaine Fusi PTA 12/07/19, 1:05 PM

## 2019-12-07 NOTE — Progress Notes (Signed)
  Subjective: 2 Days Post-Op Procedure(s) (LRB): PATELLA TENDON REPAIR (Right) Patient reports pain as 8 on 0-10 scale.   Patient is well, and has had no acute complaints or problems Plan is to go Skilled nursing facility after hospital stay. Negative for chest pain and shortness of breath Fever: no Gastrointestinal: negative for nausea and vomiting.   Patient has not had a bowel movement.  Objective: Vital signs in last 24 hours: Temp:  [97.7 F (36.5 C)-98.4 F (36.9 C)] 97.7 F (36.5 C) (06/16 0718) Pulse Rate:  [58-65] 65 (06/16 0718) Resp:  [16-17] 17 (06/16 0718) BP: (103-121)/(47-59) 113/50 (06/16 0718) SpO2:  [98 %-100 %] 98 % (06/16 0718)  Intake/Output from previous day:  Intake/Output Summary (Last 24 hours) at 12/07/2019 0743 Last data filed at 12/07/2019 0735 Gross per 24 hour  Intake 720 ml  Output 2300 ml  Net -1580 ml    Intake/Output this shift: Total I/O In: -  Out: 200 [Urine:200]  Labs: No results for input(s): HGB in the last 72 hours. No results for input(s): WBC, RBC, HCT, PLT in the last 72 hours. No results for input(s): NA, K, CL, CO2, BUN, CREATININE, GLUCOSE, CALCIUM in the last 72 hours. No results for input(s): LABPT, INR in the last 72 hours.   EXAM General - Patient is Alert, Appropriate and Oriented Extremity - Neurovascular intact Dorsiflexion/Plantar flexion intact Compartment soft Dressing/Incision -Postoperative dressing remains in place., Polar Care in place and working.  Motor Function - intact, moving foot and toes well on exam.  Cardiovascular- Regular rate and rhythm, no murmurs/rubs/gallops Respiratory- Lungs clear to auscultation bilaterally Gastrointestinal- soft, nontender and active bowel sounds   Assessment/Plan: 2 Days Post-Op Procedure(s) (LRB): PATELLA TENDON REPAIR (Right) Active Problems:   Patellar tendon rupture, right, initial encounter  Estimated body mass index is 37.38 kg/m as calculated from the  following:   Height as of this encounter: 5\' 11"  (1.803 m).   Weight as of this encounter: 121.6 kg. Advance diet Up with therapy    DVT Prophylaxis - Lovenox, Ted hose and foot pumps Partial weight-Bearing as tolerated to right leg  [name], PA-C [facility] 12/07/2019, 7:43 AM

## 2019-12-07 NOTE — Progress Notes (Signed)
Physical Therapy Treatment Patient Details Name: Justin Shannon. 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    PT Comments    Pt was seated in recliner upon arriving. He agrees to PT session requesting to use BR and then return to bed. Pt attempted to have BM but unable. He requires max assist to stand and max vcs for adhereing to proper wt bearing. Knee brace applied and locked in extension throughout. PT tends to have posterior push and required assist with fwd wt shift and lifting to stand. Pt is at high fall risk. Therapist recommends DC to SNF when medically stable. He was repositioned in bed with HOB slightly elevated, call bell in reach and bed alarm in place. Pt continue to fatigue quickly. Acute PT will continue to follow per POC.   Follow Up Recommendations  SNF     Equipment Recommendations  None recommended by PT    Recommendations for Other Services       Precautions / Restrictions Precautions Precautions: Fall Restrictions Weight Bearing Restrictions: Yes RLE Weight Bearing: Partial weight bearing Other Position/Activity Restrictions: knee brace locked in extension    Mobility  Bed Mobility Overal bed mobility: Needs Assistance Bed Mobility: Sit to Supine       Sit to supine: Max assist   General bed mobility comments: Max assist to return to supine from EOB sitting. increased time and max vcs for improved sequencing and participation.   Transfers Overall transfer level: Needs assistance Equipment used: Rolling walker (2 wheeled) Transfers: Stand Pivot Transfers   Stand pivot transfers: Max assist       General transfer comment: Pt assist to stand pivot from recliner to EOB. recliner turned so pt could pivot to L. max assist and max vcs to safely achieve. Pt tends to have slight posterior push  and required assistance with lifting and fwd wt shift.  Ambulation/Gait             General Gait Details: once in standing, pt required min assist to pivot with max vcs for proper wt bearing   Stairs             Wheelchair Mobility    Modified Rankin (Stroke Patients Only)       Balance                                            Cognition Arousal/Alertness: Awake/alert Behavior During Therapy: Anxious;Flat affect Overall Cognitive Status: Within Functional Limits for tasks assessed                                 General Comments: Pt is A and oriented 2 x       Exercises      General Comments        Pertinent Vitals/Pain Pain Assessment: 0-10 Pain Score: 6  Pain Location: R LE Pain Descriptors / Indicators: Aching;Grimacing;Operative site guarding Pain Intervention(s): Limited activity within patient's tolerance;Monitored during session;Premedicated before session;Repositioned    Home Living                      Prior Function  PT Goals (current goals can now be found in the care plan section) Acute Rehab PT Goals Patient Stated Goal: " I want to get stronger, I'll be better if my pain was better" Progress towards PT goals: Progressing toward goals    Frequency    BID      PT Plan Current plan remains appropriate    Co-evaluation              AM-PAC PT "6 Clicks" Mobility   Outcome Measure  Help needed turning from your back to your side while in a flat bed without using bedrails?: A Lot Help needed moving from lying on your back to sitting on the side of a flat bed without using bedrails?: A Lot Help needed moving to and from a bed to a chair (including a wheelchair)?: A Lot Help needed standing up from a chair using your arms (e.g., wheelchair or bedside chair)?: A Lot Help needed to walk in hospital room?: Total Help needed climbing 3-5 steps with a railing? : Total 6  Click Score: 10    End of Session Equipment Utilized During Treatment: Gait belt;Right knee immobilizer Activity Tolerance: Patient limited by fatigue Patient left: in bed;with call bell/phone within reach;with bed alarm set Nurse Communication: Mobility status PT Visit Diagnosis: Muscle weakness (generalized) (M62.81);Pain;Difficulty in walking, not elsewhere classified (R26.2);Unsteadiness on feet (R26.81) Pain - Right/Left: Right Pain - part of body: Knee     Time: 8592-9244 PT Time Calculation (min) (ACUTE ONLY): 32 min  Charges:  $Therapeutic Activity: 23-37 mins                     Julaine Fusi PTA 12/07/19, 5:02 PM

## 2019-12-07 NOTE — TOC Progression Note (Signed)
Transition of Care (TOC) - Progression Note    Patient Details  Name: Justin Shannon. MRN: [id number] Date of Birth: [birth year]  Transition of Care [facility]) CM/SW Contact  Su Hilt, RN Phone Number: 12/07/2019, 9:43 AM  Clinical Narrative:    Spoke with the patient this morning and reviewed the bed offers in detail, he chose the offer from [facility], I notified Tammy at [facility] of the bed acceptance, I called [facility] to get insurance auth started ref number [id number], spoke to clinician Claiborne Billings and provided verbal clinical and was given the approval start date today, next review date Friday Coordinator is Dollene Cleveland, I notified the physician and Tammy at [facility]  Faxed clinical over to [facility] at [phone]   Expected Discharge Plan: [place] Barriers to Discharge: Continued Medical Work up, SNF Pending bed offer  Expected Discharge Plan and Services Expected Discharge Plan: [place] arrangements for the past 2 months: Single Family Home                                       Social Determinants of Health (SDOH) Interventions    Readmission Risk Interventions No flowsheet data found.

## 2019-12-08 LAB — GLUCOSE, CAPILLARY
Glucose-Capillary: 86 mg/dL (ref 70–99)
Glucose-Capillary: 110 mg/dL — ABNORMAL HIGH (ref 70–99)
Glucose-Capillary: 94 mg/dL (ref 70–99)

## 2019-12-08 LAB — URINE CULTURE: Culture: NO GROWTH

## 2019-12-08 MED ORDER — OXYCODONE HCL 5 MG PO TABS: 5.0000 mg | ORAL_TABLET | ORAL | 0 refills | Status: DC | PRN

## 2019-12-08 NOTE — Progress Notes (Signed)
Physical Therapy Treatment Patient Details Name: Justin Shannon. MRN: [id number] DOB: 1941-06-03 Today's Date: 12/08/2019    History of Present Illness 79 y/o male s/p R TKA 11/02/19, while at rehab he suffered patellar tendon rupture and is now s/p patellar repair.    PT Comments    Pt continues to have anxious, short breaths with any activity and even with extensive cuing pre and during standing/mobility tasks he was very limited during transition recliner to bed and this afternoon essentially was incapable of lifting either foot and needed direct assist to slide/turn feet enough to be able to transition back to bed.  Pt, despite much cuing and direct assist, started to sit well before he was squared to the bed and needed heavy assist to insure that he did, indeed, land on the bed rather than the floor.  Pt struggling with all aspects of PT, mobility.  Follow Up Recommendations  SNF     Equipment Recommendations  None recommended by PT    Recommendations for Other Services       Precautions / Restrictions Precautions Precautions: Fall Restrictions RLE Weight Bearing: Partial weight bearing    Mobility  Bed Mobility Overal bed mobility: Needs Assistance Bed Mobility: Sit to Supine       Sit to supine: Max assist   General bed mobility comments: Heavy assist to get LEs back up into bed and trunk to supine  Transfers Overall transfer level: Needs assistance Equipment used: Rolling walker (2 wheeled) Transfers: Sit to/from Stand Sit to Stand: Mod assist;Max assist         General transfer comment: Pt tolerated standing poorly this afternoon with leaning back and to the L.  Poor awareness of walker positioning despite plenty of cuing  Ambulation/Gait             General Gait Details: Pt essentially unable to take even modest shuffling steps, even with heavy assist to unweight and keep hips forward/trunk upright he started to sit well before getting turned to  bed, showed almost no ability to WB through R LE to steps/shift L   Stairs             Wheelchair Mobility    Modified Rankin (Stroke Patients Only)       Balance Overall balance assessment: Needs assistance Sitting-balance support: Bilateral upper extremity supported;Feet supported Sitting balance-Leahy Scale: Fair       Standing balance-Leahy Scale: Poor Standing balance comment: Poor balande, poor safety awareness, poor use of walker, poor positioning despite heavy cuing                            Cognition Arousal/Alertness: Awake/alert Behavior During Therapy: Anxious Overall Cognitive Status:  (similar to last time he showed poor awareness about progress)                                        Exercises      General Comments General comments (skin integrity, edema, etc.): Pt needing assist to get back to bed to get on bed pain and attempt BM, further activity deferred to facilitate this      Pertinent Vitals/Pain Pain Score:  (continues to c/o a lot of pain, not rated) Pain Location: R knee    Home Living  Prior Function            PT Goals (current goals can now be found in the care plan section) Progress towards PT goals: Not progressing toward goals - comment (anxiety, inability to push himself, poor act tolerance)    Frequency    BID      PT Plan Current plan remains appropriate    Co-evaluation              AM-PAC PT "6 Clicks" Mobility   Outcome Measure  Help needed turning from your back to your side while in a flat bed without using bedrails?: A Lot Help needed moving from lying on your back to sitting on the side of a flat bed without using bedrails?: A Lot Help needed moving to and from a bed to a chair (including a wheelchair)?: A Lot Help needed standing up from a chair using your arms (e.g., wheelchair or bedside chair)?: A Lot Help needed to walk in hospital  room?: Total Help needed climbing 3-5 steps with a railing? : Total 6 Click Score: 10    End of Session Equipment Utilized During Treatment: Gait belt;Right knee immobilizer Activity Tolerance: Patient limited by fatigue;Patient limited by pain Patient left: in bed;with call bell/phone within reach;with nursing/sitter in room Nurse Communication: Mobility status PT Visit Diagnosis: Muscle weakness (generalized) (M62.81);Pain;Difficulty in walking, not elsewhere classified (R26.2);Unsteadiness on feet (R26.81) Pain - Right/Left: Right Pain - part of body: Knee     Time: 8127-5170 PT Time Calculation (min) (ACUTE ONLY): 12 min  Charges:  $Therapeutic Activity: 8-22 mins                     Kreg Shropshire, DPT 12/08/2019, 4:03 PM

## 2019-12-08 NOTE — Progress Notes (Signed)
Physical Therapy Treatment Patient Details Name: Justin Shannon. MRN: [id number] DOB: Nov 28, 1940 Today's Date: 12/08/2019    History of Present Illness 79 y/o male s/p R TKA 11/02/19, while at rehab he suffered patellar tendon rupture and is now s/p patellar repair.    PT Comments    Pt continues to struggle with most aspects of PT session.  He remains anxious and very cautious with all mobility; fearful and needing extra cuing to perform even modest mobility and exercises.  He c/o "10/10" pain without heavy grimacing or overt signs of severe pain.  Pt was able to actually take a few forward steps with heavy +2 assist and cuing.  L UE weak and pt leaning to the L during standing, needed some unweighting assist with each attempt at R WBing and L foot advancement (unable to get toes even with or past R) with plenty of cuing.    Follow Up Recommendations  SNF     Equipment Recommendations  None recommended by PT    Recommendations for Other Services       Precautions / Restrictions Precautions Precautions: Fall Restrictions Weight Bearing Restrictions: Yes RLE Weight Bearing: Partial weight bearing    Mobility  Bed Mobility Overal bed mobility: Needs Assistance Bed Mobility: Supine to Sit Rolling: Mod assist   Supine to sit: Mod assist;Max assist     General bed mobility comments: Pt needing a lot of encouragement/cuing to initiate movement, ultimately needed a lot of assist to attain  Transfers Overall transfer level: Needs assistance Equipment used: Rolling walker (2 wheeled)   Sit to Stand: Mod assist;From elevated surface;+2 physical assistance            Ambulation/Gait Ambulation/Gait assistance: Max assist Gait Distance (Feet): 5 Feet Assistive device: Rolling walker (2 wheeled)       General Gait Details: Pt continues to be fearful/hesitant with standing activities but with heavy cuing and encouragment he was able to take a few small very labored steps.   L UE generally weak and needing some support to keep from leaning to far L.  Pt unable to advance L toes past R secondary to poor R WBing tolerance/fear but overall did better today than any previous attempts at ambulation    Stairs             Wheelchair Mobility    Modified Rankin (Stroke Patients Only)       Balance Overall balance assessment: Needs assistance Sitting-balance support: Bilateral upper extremity supported;Feet supported Sitting balance-Leahy Scale: Fair     Standing balance support: Bilateral upper extremity supported Standing balance-Leahy Scale: Poor Standing balance comment: requires direct assist in standing to maintain upright                            Cognition Arousal/Alertness: Awake/alert Behavior During Therapy: Anxious Overall Cognitive Status: Within Functional Limits for tasks assessed                                        Exercises General Exercises - Lower Extremity Ankle Circles/Pumps: 10 reps;Strengthening;AROM Quad Sets: Strengthening;10 reps Hip ABduction/ADduction: AAROM;15 reps Straight Leg Raises: PROM;5 reps (too painful and weak for successful execution)    General Comments        Pertinent Vitals/Pain Pain Assessment: 0-10 Pain Score: 10-Worst pain ever (when questioned, agrees that "Maybe not 10,  but still a lot")    Home Living                      Prior Function            PT Goals (current goals can now be found in the care plan section) Progress towards PT goals: Progressing toward goals    Frequency    BID      PT Plan Current plan remains appropriate    Co-evaluation              AM-PAC PT "6 Clicks" Mobility   Outcome Measure  Help needed turning from your back to your side while in a flat bed without using bedrails?: A Lot Help needed moving from lying on your back to sitting on the side of a flat bed without using bedrails?: A Lot Help needed  moving to and from a bed to a chair (including a wheelchair)?: A Lot Help needed standing up from a chair using your arms (e.g., wheelchair or bedside chair)?: A Lot Help needed to walk in hospital room?: Total Help needed climbing 3-5 steps with a railing? : Total 6 Click Score: 10    End of Session Equipment Utilized During Treatment: Gait belt;Right knee immobilizer Activity Tolerance: Patient limited by fatigue;Patient limited by pain Patient left: with chair alarm set;with call bell/phone within reach;with family/visitor present Nurse Communication: Mobility status PT Visit Diagnosis: Muscle weakness (generalized) (M62.81);Pain;Difficulty in walking, not elsewhere classified (R26.2);Unsteadiness on feet (R26.81) Pain - Right/Left: Right Pain - part of body: Knee     Time: 5573-2202 PT Time Calculation (min) (ACUTE ONLY): 32 min  Charges:  $Gait Training: 8-22 mins $Therapeutic Exercise: 8-22 mins                     Kreg Shropshire, DPT 12/08/2019, 12:25 PM

## 2019-12-08 NOTE — Progress Notes (Signed)
  Subjective: 3 Days Post-Op Procedure(s) (LRB): PATELLA TENDON REPAIR (Right) Patient reports pain as well-controlled.   Patient is well, and has had no acute complaints or problems Plan is to go Skilled nursing facility after hospital stay. Negative for chest pain and shortness of breath Fever: no Gastrointestinal: negative for nausea and vomiting.   Patient has not had a bowel movement.  Objective: Vital signs in last 24 hours: Temp:  [98.1 F (36.7 C)-98.4 F (36.9 C)] 98.4 F (36.9 C) (06/17 0731) Pulse Rate:  [70-79] 70 (06/17 0731) Resp:  [15-19] 15 (06/17 0731) BP: (105-129)/(46-53) 115/46 (06/17 0731) SpO2:  [98 %-100 %] 98 % (06/17 0731)  Intake/Output from previous day:  Intake/Output Summary (Last 24 hours) at 12/08/2019 1059 Last data filed at 12/08/2019 0948 Gross per 24 hour  Intake 296 ml  Output 1750 ml  Net -1454 ml    Intake/Output this shift: Total I/O In: -  Out: 200 [Urine:200]  Labs: Recent Labs    12/07/19 1652  HGB 8.5*   Recent Labs    12/07/19 1652  WBC 10.8*  RBC 3.72*  HCT 26.6*  PLT 158   Recent Labs    12/07/19 1652  NA 138  K 3.4*  CL 104  CO2 27  BUN 52*  CREATININE 2.02*  GLUCOSE 103*  CALCIUM 9.2   No results for input(s): LABPT, INR in the last 72 hours.   EXAM General - Patient is Alert, Appropriate and Oriented Extremity - Neurovascular intact Dorsiflexion/Plantar flexion intact Compartment soft; brace locked in full extension Dressing/Incision -Postoperative dressing remains in place., Polar Care in place and working. Following post-op dressing removal, mild sanguinous drainage noted over incision. Motor Function - intact, moving foot and toes well on exam.  Cardiovascular- Regular rate and rhythm, no murmurs/rubs/gallops Respiratory- Lungs clear to auscultation bilaterally Gastrointestinal- soft, nontender and active bowel sounds   Assessment/Plan: 3 Days Post-Op Procedure(s) (LRB): PATELLA TENDON  REPAIR (Right) Active Problems:   Patellar tendon rupture, right, initial encounter  Estimated body mass index is 37.38 kg/m as calculated from the following:   Height as of this encounter: 5\' 11"  (1.803 m).   Weight as of this encounter: 121.6 kg. Advance diet Discharge to SNF pending bowel movement Continue physical therapy.  Post-op dressing removed.   DVT Prophylaxis - Eloquis, Ted hose and foot pumps Partial weight-Bearing as tolerated to right leg  [name], PA-C [facility] Orthopaedic Surgery 12/08/2019, 10:59 AM

## 2019-12-08 NOTE — Discharge Summary (Signed)
Physician Discharge Summary  Patient ID: Justin Shannon. MRN: [id number] DOB/AGE: [birth year] 79 y.o.  Admit date: 12/05/2019 Discharge date: 12/08/2019  Admission Diagnoses:  Patellar tendon rupture, right, initial encounter [S86.811A]  Surgeries:Procedure(s): PROCEDURE: Repair of right patellar tendon rupture  SURGEON:  Marciano Sequin., M.D.          ASSISTANT: [name], PA-C  ANESTHESIA: general  ESTIMATED BLOOD LOSS: 25 mL  FLUIDS REPLACED: 1000 mL of crystalloid  TOURNIQUET TIME: 90 minutes  DRAINS: Not utilized  IMPLANTS UTILIZED: Arthrex Internal brace knee ligament augmentation, 5.5 mm biocomposite suture anchor, 4.75 mm biocomposite suture anchor, 4 Fibertape sutures.  Discharge Diagnoses: Patient Active Problem List   Diagnosis Date Noted  . Patellar tendon rupture, right, initial encounter 12/05/2019  . Total knee replacement status 11/02/2019  . PAD (peripheral artery disease) ([place]) 10/13/2019  . Hyperkalemia   . Acute kidney injury superimposed on CKD ([place])   . Anemia of chronic renal failure, stage 3a   . Benign prostatic hyperplasia   . Seizure ([place])   . Pneumonia due to COVID-19 virus 06/24/2019  . Respiratory failure with hypoxia ([place]) 06/24/2019  . Anemia due to stage 4 chronic kidney disease ([place]) 06/24/2019  . Essential hypertension 06/24/2019  . Generalized weakness 06/24/2019  . Primary osteoarthritis of right knee 04/03/2019  . Venous ulcer of ankle, right ([place]) 05/08/2018  . Use of cane as ambulatory aid 04/27/2018  . Type 2 diabetes mellitus with stage 4 chronic kidney disease, without long-term current use of insulin ([place]) 06/11/2017  . Lymphedema 05/04/2016  . Chronic venous insufficiency 05/04/2016  . Toe sprain 01/24/2016  . Health care maintenance 12/11/2014  . Morbid (severe) obesity due to excess calories ([place]) 05/24/2014  . Morbid obesity with body mass index of 40.0-44.9 in adult [facility]) 05/24/2014  . OSA on CPAP  01/10/2014  . Chronic kidney disease (CKD), stage III (moderate) 11/22/2013  . Allergic rhinitis 11/06/2013  . Benign hypertension with chronic kidney disease, stage III ([place]) 11/06/2013  . Epilepsy ([place]) 11/06/2013  . Gout 11/06/2013  . History of DVT (deep vein thrombosis) 11/06/2013  . Nocturnal seizures ([place]) 11/06/2013  . Chronic deep vein thrombosis (DVT) of proximal vein of lower extremity ([place]) 11/06/2013    Past Medical History:  Diagnosis Date  . Arthritis   . Bilateral swelling of feet   . BPH (benign prostatic hyperplasia)   . Chronic kidney disease   . Diabetes mellitus without complication ([place])   . DVT (deep venous thrombosis) ([facility])    right lower extremity  . History of gout   . Hypertension   . Seizures ([place])   . Sleep apnea   . Tendonitis of foot    left     Transfusion: n/a   Consultants (if any):   Discharged Condition: Improved  Hospital Course: Justin Shannon. is an 79 y.o. male who was admitted 12/05/2019 with a diagnosis of right patellar tendon rupture status post total knee arthroplasty and went to the operating room on 12/05/2019 and underwent repair of right patellar tendon. The patient received perioperative antibiotics for prophylaxis (see below). The patient tolerated the procedure well and was transported to PACU in stable condition. After meeting PACU criteria, the patient was subsequently transferred to the Orthopaedics/Rehabilitation unit.   The patient received DVT prophylaxis in the form of early mobilization, Xarelto, Foot Pumps and TED hose. A sacral pad had been placed and heels were elevated off of the bed with rolled towels in order  to protect skin integrity. Foley catheter was discontinued on postoperative day #0.  The surgical incision was healing well without signs of infection.  Physical therapy was initiated postoperatively for transfers, gait training, and strengthening. Occupational therapy was initiated for activities of daily  living and evaluation for assisted devices. Rehabilitation goals were reviewed in detail with the patient. The patient made steady progress with physical therapy and physical therapy recommended discharge to Skilled nursing facility.   The patient achieved the preliminary goals of this hospitalization and was felt to be medically and orthopaedically appropriate for discharge.  He was given perioperative antibiotics:  Anti-infectives (From admission, onward)   Start     Dose/Rate Route Frequency Ordered Stop   12/05/19 1800  ceFAZolin (ANCEF) IVPB 2g/100 mL premix        2 g 200 mL/hr over 30 Minutes Intravenous Every 6 hours 12/05/19 1738 12/06/19 1430   12/05/19 0600  ceFAZolin (ANCEF) 3 g in dextrose 5 % 50 mL IVPB        3 g 100 mL/hr over 30 Minutes Intravenous On call to O.R. 12/04/19 2140 12/05/19 1204    .  Recent vital signs:  Vitals:   12/07/19 2338 12/08/19 0731  BP: (!) 105/47 (!) 115/46  Pulse: 79 70  Resp: 19 15  Temp: 98.1 F (36.7 C) 98.4 F (36.9 C)  SpO2: 100% 98%    Recent laboratory studies:  Recent Labs    12/07/19 1652  WBC 10.8*  HGB 8.5*  HCT 26.6*  PLT 158  K 3.4*  CL 104  CO2 27  BUN 52*  CREATININE 2.02*  GLUCOSE 103*  CALCIUM 9.2    Diagnostic Studies: No results found.  Discharge Medications:   Allergies as of 12/08/2019      Reactions   Shellfish Allergy Anaphylaxis, Other (See Comments)   gout   Iodinated Diagnostic Agents Other (See Comments)   Chronic kidney disease 3b   Nsaids Other (See Comments)   Chronic kidney disease 3b      Medication List    STOP taking these medications   HYDROcodone-acetaminophen 5-325 MG tablet Commonly known as: NORCO/VICODIN     TAKE these medications   Accu-Chek FastClix Lancets Misc USE TO CHECK GLUCOSE TWICE DAILY   Accu-Chek Guide Me w/Device Kit 2 (two) times daily.   Accu-Chek Guide test strip Generic drug: glucose blood USE STRIP TO CHECK GLUCOSE TWICE DAILY   albuterol 108  (90 Base) MCG/ACT inhaler Commonly known as: VENTOLIN HFA Inhale 2 puffs into the lungs every 4 (four) hours as needed for wheezing.   allopurinol 100 MG tablet Commonly known as: ZYLOPRIM Take 100 mg by mouth daily.   Colace 100 MG capsule Generic drug: docusate sodium Take 100 mg by mouth daily as needed for mild constipation or moderate constipation.   diltiazem 120 MG 24 hr capsule Commonly known as: CARDIZEM CD Take 120 mg by mouth daily.   fexofenadine 180 MG tablet Commonly known as: ALLEGRA Take 180 mg by mouth daily as needed for allergies.   fluticasone 50 MCG/ACT nasal spray Commonly known as: FLONASE Place 2 sprays into both nostrils daily as needed for allergies.   Fluticasone-Salmeterol 250-50 MCG/DOSE Aepb Commonly known as: ADVAIR Inhale 1 puff into the lungs 2 (two) times daily as needed (Wheezing).   levETIRAcetam 500 MG tablet Commonly known as: KEPPRA Take 500 mg by mouth 2 (two) times daily.   Magnesium Citrate 100 MG Tabs Take 100 mg by mouth daily.  metoprolol succinate 50 MG 24 hr tablet Commonly known as: TOPROL-XL Take 50 mg by mouth daily.   olmesartan-hydrochlorothiazide 40-12.5 MG tablet Commonly known as: BENICAR HCT Take 1 tablet by mouth daily.   ONE-A-DAY MENS PO Take 1 tablet by mouth daily.   oxyCODONE 5 MG immediate release tablet Commonly known as: Oxy IR/ROXICODONE Take 1 tablet (5 mg total) by mouth every 4 (four) hours as needed for moderate pain (pain score 4-6).   torsemide 20 MG tablet Commonly known as: DEMADEX Take 20 mg by mouth daily.   vitamin C 500 MG tablet Commonly known as: ASCORBIC ACID Take 500 mg by mouth daily.   Xarelto 20 MG Tabs tablet Generic drug: rivaroxaban Take 20 mg by mouth daily.            Durable Medical Equipment  (From admission, onward)         Start     Ordered   12/05/19 1809  DME Walker rolling  Once       Question:  Patient needs a walker to treat with the following  condition  Answer:  Total knee replacement status   12/05/19 1809   12/05/19 1809  DME Bedside commode  Once       Question:  Patient needs a bedside commode to treat with the following condition  Answer:  Total knee replacement status   12/05/19 1809          Disposition: Skilled nursing facility.   PT Instructions: Partial weightbearing to right lower extremity. Must keep knee immobilizer on at all times. Focus on gait training, transfers. Dressing to be changed every 4-5 days as needed. Keep incision clean and dry.    Follow-up Information    Fausto Skillern, PA-C Follow up on 12/13/2019.   Specialty: Orthopedic Surgery Why: @ 3:45 PM Contact information: [place] [address] [phone]        Dereck Leep, MD Follow up on 01/17/2020.   Specialty: Orthopedic Surgery Why: @ 2:45 PM Contact information: [place] [address] [place], PA-C 12/08/2019, 12:21 PM

## 2019-12-08 NOTE — Progress Notes (Signed)
Inpatient Diabetes Program Recommendations  AACE/ADA: New Consensus Statement on Inpatient Glycemic Control (2015)  Target Ranges:  Prepandial:   less than 140 mg/dL      [facility] postprandial:   less than 180 mg/dL (1-2 hours)      Critically ill patients:  140 - 180 mg/dL   Lab Results  Component Value Date   GLUCAP 86 12/08/2019   HGBA1C 6.0 (H) 10/28/2019    Review of Glycemic Control Results for ELNATHAN, FULFORD (MRN [id number]) as of 12/08/2019 09:38  Ref. Range 12/07/2019 07:20 12/07/2019 12:22 12/07/2019 16:15 12/07/2019 21:35 12/07/2019 22:14 12/08/2019 07:34  Glucose-Capillary Latest Ref Range: 70 - 99 mg/dL 88 81 125 (H)  Novolog 2 units given per moderate correction scale 61 (L) 94 86   Patellar tendon rupture  Diabetes history: DM 2 Outpatient Diabetes medications: no meds at home Current orders for Inpatient glycemic control:  Novolog 0-15 units tid  A1c 6% on 5/7  Inpatient Diabetes Program Recommendations:    Hypoglycemia in the setting of elevated renal function.  -  Consider decreasing Novolog Correction down to " very sensitive" 0-6 units tid starting at glucose 150 mg/dl.  Thanks,  Tama Headings RN, MSN, BC-ADM Inpatient Diabetes Coordinator Team Pager [phone] (8a-5p)

## 2019-12-08 NOTE — Discharge Instructions (Signed)
PT Instructions: Partial weightbearing to right lower extremity. Must keep knee immobilizer on at all times. Focus on gait training, transfers. Dressing to be changed every 4-5 days as needed. Keep incision clean and dry.

## 2019-12-08 NOTE — TOC Progression Note (Signed)
Transition of Care (TOC) - Progression Note    Patient Details  Name: Justin Shannon. MRN: [id number] Date of Birth: 27-Mar-1941  [facility]) CM/SW Contact  Su Hilt, RN Phone Number: 12/08/2019, 4:06 PM  Clinical Narrative:    Spoke with the patient's daughter and notified her of the DC to [facility], She is aware that the family need sot take the patient's CPAP to the facility The bedside nurse is going to call report, RNCM called EMS for transport   Expected Discharge Plan: [place] Barriers to Discharge: Continued Medical Work up, SNF Pending bed offer  Expected Discharge Plan and Services Expected Discharge Plan: [place] arrangements for the past 2 months: Single Family Home Expected Discharge Date: 12/08/19                                     Social Determinants of Health (SDOH) Interventions    Readmission Risk Interventions No flowsheet data found.

## 2019-12-12 ENCOUNTER — Other Ambulatory Visit: Payer: Self-pay

## 2019-12-12 ENCOUNTER — Ambulatory Visit: Payer: Medicare Other | Admitting: Podiatry

## 2019-12-12 ENCOUNTER — Encounter: Payer: Self-pay | Admitting: Podiatry

## 2019-12-12 DIAGNOSIS — N183 Chronic kidney disease, stage 3 unspecified: Secondary | ICD-10-CM

## 2019-12-12 DIAGNOSIS — M79609 Pain in unspecified limb: Secondary | ICD-10-CM

## 2019-12-12 DIAGNOSIS — N184 Chronic kidney disease, stage 4 (severe): Secondary | ICD-10-CM

## 2019-12-12 DIAGNOSIS — D689 Coagulation defect, unspecified: Secondary | ICD-10-CM

## 2019-12-12 DIAGNOSIS — E1122 Type 2 diabetes mellitus with diabetic chronic kidney disease: Secondary | ICD-10-CM

## 2019-12-12 DIAGNOSIS — B351 Tinea unguium: Secondary | ICD-10-CM | POA: Diagnosis not present

## 2019-12-12 NOTE — Progress Notes (Addendum)
This patient returns to my office for at risk foot care.  This patient requires this care by a professional since this patient will be at risk due to having  Venous insufficiency , DVT, Diabetes and  CKD-4  This patient is unable to cut nails himself since the patient cannot reach his nails.These nails are painful walking and wearing shoes.  This patient presents for at risk foot care today.  Patient is taking xarelto. This patient is brought to the office in a wheelchair accompanied by caregiver.  General Appearance  Alert, conversant and in no acute stress.  Vascular  Dorsalis pedis and posterior tibial  pulses are palpable  bilaterally.  Capillary return is within normal limits  bilaterally. Temperature is within normal limits  bilaterally.  Neurologic  Senn-Weinstein monofilament wire test within normal limits  bilaterally. Muscle power within normal limits bilaterally.  Nails Thick disfigured discolored nails with subungual debris  from hallux to fifth toes bilaterally. No evidence of bacterial infection or drainage bilaterally.  Orthopedic  No limitations of motion  feet .  No crepitus or effusions noted.  No bony pathology or digital deformities noted.  Skin  normotropic skin with no porokeratosis noted bilaterally.  No signs of infections or ulcers noted.     Onychomycosis  Pain in right toes  Pain in left toes  Consent was obtained for treatment procedures.   Mechanical debridement of nails 1-5  bilaterally performed with a nail nipper.  Filed with dremel without incident. No infection or ulcer.  Padding applied over right ankle to help prevent skin injury due to brace right leg.   Return office visit   3 months                   Told patient to return for periodic foot care and evaluation due to potential at risk complications.   Gardiner Barefoot DPM

## 2020-01-16 DIAGNOSIS — N179 Acute kidney failure, unspecified: Secondary | ICD-10-CM | POA: Insufficient documentation

## 2020-01-16 DIAGNOSIS — D631 Anemia in chronic kidney disease: Secondary | ICD-10-CM | POA: Insufficient documentation

## 2020-01-16 DIAGNOSIS — N183 Chronic kidney disease, stage 3 unspecified: Secondary | ICD-10-CM | POA: Insufficient documentation

## 2020-01-22 DIAGNOSIS — Z96651 Presence of right artificial knee joint: Secondary | ICD-10-CM | POA: Insufficient documentation

## 2020-02-23 ENCOUNTER — Encounter (INDEPENDENT_AMBULATORY_CARE_PROVIDER_SITE_OTHER): Payer: Self-pay

## 2020-02-29 ENCOUNTER — Other Ambulatory Visit: Payer: Self-pay | Admitting: Neurology

## 2020-02-29 DIAGNOSIS — M5412 Radiculopathy, cervical region: Secondary | ICD-10-CM

## 2020-03-01 ENCOUNTER — Ambulatory Visit (INDEPENDENT_AMBULATORY_CARE_PROVIDER_SITE_OTHER): Payer: Medicare Other | Admitting: Nurse Practitioner

## 2020-03-12 ENCOUNTER — Ambulatory Visit (INDEPENDENT_AMBULATORY_CARE_PROVIDER_SITE_OTHER): Payer: Medicare Other | Admitting: Nurse Practitioner

## 2020-03-12 ENCOUNTER — Encounter (INDEPENDENT_AMBULATORY_CARE_PROVIDER_SITE_OTHER): Payer: Self-pay | Admitting: Nurse Practitioner

## 2020-03-12 ENCOUNTER — Other Ambulatory Visit: Payer: Self-pay

## 2020-03-12 VITALS — BP 144/81 | HR 67 | Ht 71.0 in

## 2020-03-12 DIAGNOSIS — I890 Lymphedema, not elsewhere classified: Secondary | ICD-10-CM

## 2020-03-12 DIAGNOSIS — S86811S Strain of other muscle(s) and tendon(s) at lower leg level, right leg, sequela: Secondary | ICD-10-CM | POA: Diagnosis not present

## 2020-03-12 DIAGNOSIS — I10 Essential (primary) hypertension: Secondary | ICD-10-CM

## 2020-03-13 LAB — HEMOGLOBIN A1C: Hemoglobin A1C: 5.5

## 2020-03-15 ENCOUNTER — Ambulatory Visit: Payer: Medicare Other | Admitting: Podiatry

## 2020-03-19 ENCOUNTER — Encounter (INDEPENDENT_AMBULATORY_CARE_PROVIDER_SITE_OTHER): Payer: Self-pay | Admitting: Nurse Practitioner

## 2020-03-19 ENCOUNTER — Other Ambulatory Visit: Payer: Self-pay

## 2020-03-19 ENCOUNTER — Ambulatory Visit
Admission: RE | Admit: 2020-03-19 | Discharge: 2020-03-19 | Disposition: A | Discharge status: Home / Self Care | Payer: Medicare Other | Source: Ambulatory Visit | Attending: Neurology | Admitting: Neurology

## 2020-03-19 DIAGNOSIS — M5412 Radiculopathy, cervical region: Secondary | ICD-10-CM | POA: Diagnosis not present

## 2020-03-19 NOTE — Progress Notes (Signed)
Subjective:    Patient ID: Justin Shannon., male    DOB: [birth year], 79 y.o.   MRN: [id number] Chief Complaint  Patient presents with  . 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  The patient recently had surgery to repair his right patellar tendon.  Following the surgery he has not been able to bear weight and spends majority of his day in a leg brace.  Because of this he is not able to elevate his leg majority of the day.  It has been noted that he has been having worsening swelling that rubs against his brace.  Currently the patient does not have any venous ulcerations however there is a concern for ulcerations due to the persistent swelling.  The swelling also makes the patient very uncomfortable and the patient is not able to tolerate it for extended periods due to the swelling.  He denies any fever, chills, nausea, vomiting or diarrhea.  The swelling in the left lower extremity is under control.   Review of Systems  Cardiovascular: Positive for leg swelling.  Musculoskeletal: Positive for arthralgias.  Neurological: Positive for weakness.  All other systems reviewed and are negative.      Objective:   Physical Exam Vitals reviewed.  HENT:     Head: Normocephalic.  Cardiovascular:     Rate and Rhythm: Normal rate.     Pulses: Normal pulses.  Pulmonary:     Effort: Pulmonary effort is normal.  Musculoskeletal:     Right lower leg: Edema present.  Skin:    General: Skin is warm and dry.  Neurological:     Mental Status: He is alert. Mental status is at baseline.     Motor: Weakness present.     Gait: Gait abnormal.  Psychiatric:        Mood and Affect: Mood normal.        Behavior: Behavior normal.        Judgment: Judgment normal.     BP (!) 144/81   Pulse 67   Ht 5' 11"  (1.803 m)   BMI 37.38 kg/m   Past Medical History:  Diagnosis Date  . Arthritis   . Bilateral swelling of feet   . BPH  (benign prostatic hyperplasia)   . Chronic kidney disease   . Diabetes mellitus without complication ([place])   . DVT (deep venous thrombosis) (H[facility])    right lower extremity  . History of gout   . Hypertension   . Seizures ([place])   . Sleep apnea   . Tendonitis of foot    left    Social History   Socioeconomic History  . Marital status: Married    Spouse name: Not on file  . Number of children: Not on file  . Years of education: Not on file  . Highest education level: Not on file  Occupational History  . Not on file  Tobacco Use  . Smoking status: Former Smoker    Packs/day: 1.00    Years: 2.00    Pack years: 2.00    Types: Cigarettes    Quit date: 06/23/1968    Years since quitting: 51.7  . Smokeless tobacco: Never Used  . Tobacco comment: 25 years ago quit  Vaping Use  . Vaping Use: Never used  Substance and Sexual Activity  . Alcohol use: No  . Drug use: No  . Sexual activity: Not on file  Other Topics Concern  . Not on file  Social History Narrative  . Not on file   Social Determinants of Health   Financial Resource Strain:   . Difficulty of Paying Living Expenses: Not on file  Food Insecurity:   . Worried About Charity fundraiser in the Last Year: Not on file  . Ran Out of Food in the Last Year: Not on file  Transportation Needs:   . Lack of Transportation (Medical): Not on file  . Lack of Transportation (Non-Medical): Not on file  Physical Activity:   . Days of Exercise per Week: Not on file  . Minutes of Exercise per Session: Not on file  Stress:   . Feeling of Stress : Not on file  Social Connections:   . Frequency of Communication with Friends and Family: Not on file  . Frequency of Social Gatherings with Friends and Family: Not on file  . Attends Religious Services: Not on file  . Active Member of Clubs or Organizations: Not on file  . Attends Archivist Meetings: Not on file  . Marital Status: Not on file  Intimate Partner Violence:     . Fear of Current or Ex-Partner: Not on file  . Emotionally Abused: Not on file  . Physically Abused: Not on file  . Sexually Abused: Not on file    Past Surgical History:  Procedure Laterality Date  . APPENDECTOMY    . APPLICATION OF WOUND VAC Right 11/02/2019   Procedure: APPLICATION OF WOUND VAC;  Surgeon: Dereck Leep, MD;  Location: [facility];  Service: Orthopedics;  Laterality: Right;  [id number]  . HERNIA REPAIR     UMBILICAL  . IVC FILTER INSERTION N/A 10/25/2019   Procedure: IVC FILTER INSERTION;  Surgeon: Katha Cabal, MD;  Location: [facility] [facility];  Service: Cardiovascular;  Laterality: N/A;  . KNEE ARTHROPLASTY Right 11/02/2019   Procedure: COMPUTER ASSISTED TOTAL KNEE ARTHROPLASTY;  Surgeon: Dereck Leep, MD;  Location: [facility];  Service: Orthopedics;  Laterality: Right;  . PATELLAR TENDON REPAIR Right 12/05/2019   Procedure: PATELLA TENDON REPAIR;  Surgeon: Dereck Leep, MD;  Location: [facility];  Service: Orthopedics;  Laterality: Right;  . PROSTATE SURGERY      Family History  Problem Relation Age of Onset  . Arthritis Mother   . Hypertension Mother   . Diabetes Brother     Allergies  Allergen Reactions  . Shellfish Allergy Anaphylaxis and Other (See Comments)    gout  . Iodinated Diagnostic Agents Other (See Comments)    Chronic kidney disease 3b  . Nsaids Other (See Comments)    Chronic kidney disease 3b       Assessment & Plan:   1. Lymphedema Patient recently had surgery to repair his right patellar tendon.  Since this time he has had some difficulty with controlling his edema.  His edema was fluctuating greatly, causing friction with his brace.  Unfortunately during his surgery he is not able to wear standard compression socks.  Patient was given a prescription for thigh-high TED hose to see if this will help control the edema and prevent friction that could possibly result in a venous ulceration.  We will have the patient return in 6  months or sooner if issues arise.  2. Essential hypertension Continue antihypertensive medications as already ordered, these medications have been reviewed and there are no changes at this time.   3. Rupture of right patellar tendon, sequela The patient  currently has to maintain his leg in a brace following his repair.  Due to not being able to properly elevate his leg or ambulate significantly, this is likely causing worsening of his lymphedema.   Current Outpatient Medications on File Prior to Visit  Medication Sig Dispense Refill  . Accu-Chek FastClix Lancets MISC USE TO CHECK GLUCOSE TWICE DAILY    . ACCU-CHEK GUIDE test strip USE STRIP TO CHECK GLUCOSE TWICE DAILY    . albuterol (VENTOLIN HFA) 108 (90 Base) MCG/ACT inhaler Inhale 2 puffs into the lungs every 4 (four) hours as needed for wheezing.    Marland Kitchen allopurinol (ZYLOPRIM) 100 MG tablet Take 100 mg by mouth daily.    . Blood Glucose Monitoring Suppl (ACCU-CHEK GUIDE ME) w/Device KIT 2 (two) times daily.    Marland Kitchen diltiazem (CARDIZEM CD) 120 MG 24 hr capsule Take 120 mg by mouth daily.     Marland Kitchen docusate sodium (COLACE) 100 MG capsule Take 100 mg by mouth daily as needed for mild constipation or moderate constipation.     . fexofenadine (ALLEGRA) 180 MG tablet Take 180 mg by mouth daily as needed for allergies.     . fluticasone (FLONASE) 50 MCG/ACT nasal spray Place 2 sprays into both nostrils daily as needed for allergies.     . Fluticasone-Salmeterol (ADVAIR) 250-50 MCG/DOSE AEPB Inhale 1 puff into the lungs 2 (two) times daily as needed (Wheezing).    Marland Kitchen levETIRAcetam (KEPPRA) 500 MG tablet Take 500 mg by mouth 2 (two) times daily.     . Magnesium Citrate 100 MG TABS Take 100 mg by mouth daily.    . melatonin 5 MG TABS Take 5 mg by mouth.    . Multiple Vitamin (ONE-A-DAY MENS PO) Take 1 tablet by mouth daily.     Marland Kitchen olmesartan (BENICAR) 40 MG tablet Take 40 mg by mouth daily. Give a  Half tablet daily    . rivaroxaban (XARELTO) 20 MG TABS  tablet Take 20 mg by mouth daily.     Marland Kitchen torsemide (DEMADEX) 20 MG tablet Take 20 mg by mouth daily.    Marland Kitchen triamcinolone cream (KENALOG) 0.1 % APPLY TOPICALLY TWICE DAILY AS NEEDED    . vitamin C (ASCORBIC ACID) 500 MG tablet Take 500 mg by mouth daily.     . metoprolol succinate (TOPROL-XL) 50 MG 24 hr tablet Take 50 mg by mouth daily.  (Patient not taking: Reported on 03/12/2020)    . olmesartan-hydrochlorothiazide (BENICAR HCT) 40-12.5 MG tablet Take 1 tablet by mouth daily. (Patient not taking: Reported on 03/12/2020)    . oxyCODONE (OXY IR/ROXICODONE) 5 MG immediate release tablet Take 1 tablet (5 mg total) by mouth every 4 (four) hours as needed for moderate pain (pain score 4-6). (Patient not taking: Reported on 03/12/2020) 1 tablet 0   No current facility-administered medications on file prior to visit.    There are no Patient Instructions on file for this visit. No follow-ups on file.   Kris Hartmann, NP

## 2020-03-20 ENCOUNTER — Ambulatory Visit: Payer: Medicare Other | Admitting: Physician Assistant

## 2020-03-26 ENCOUNTER — Ambulatory Visit: Payer: Medicare Other | Admitting: Podiatry

## 2020-03-26 ENCOUNTER — Other Ambulatory Visit: Payer: Self-pay

## 2020-03-26 ENCOUNTER — Ambulatory Visit (INDEPENDENT_AMBULATORY_CARE_PROVIDER_SITE_OTHER): Payer: Medicare Other | Admitting: Physician Assistant

## 2020-03-26 ENCOUNTER — Encounter: Payer: Self-pay | Admitting: Physician Assistant

## 2020-03-26 VITALS — BP 133/72 | HR 76 | Ht 71.0 in | Wt 268.0 lb

## 2020-03-26 DIAGNOSIS — N509 Disorder of male genital organs, unspecified: Secondary | ICD-10-CM | POA: Diagnosis not present

## 2020-03-26 NOTE — Progress Notes (Signed)
03/26/2020 10:50 AM   Justin Keens Jr. August 26, 1940 [id number]  CC: Chief Complaint  Patient presents with  . Other   HPI: Justin Enrico. is a 79 y.o. 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  He is accompanied today by his daughter, [name], via telephone.  Today, patient reports an approximate 18-monthhistory of skin lesions of the posterior scrotum and perineum.  He states they are slightly itchy but not painful.  He believes the lesions resolved at one point but have returned.  He notes some bleeding from the lesions.  There is concern by home health for possible genital herpes.  PMH: Past Medical History:  Diagnosis Date  . Arthritis   . Bilateral swelling of feet   . BPH (benign prostatic hyperplasia)   . Chronic kidney disease   . [place]   . DVT (deep venous thrombosis) ([facility])    right lower extremity  . History of gout   . Hypertension   . [place]   . Sleep apnea   . Tendonitis of foot    left    Surgical History: Past Surgical History:  Procedure Laterality Date  . APPENDECTOMY    . APPLICATION OF WOUND VAC Right 11/02/2019   Procedure: APPLICATION OF WOUND VAC;  Surgeon: HDereck Leep MD;  Location: [facility];  Service: Orthopedics;  Laterality: Right;  [id number]  . HERNIA REPAIR     UMBILICAL  . IVC FILTER INSERTION N/A 10/25/2019   Procedure: IVC FILTER INSERTION;  Surgeon: SKatha Cabal MD;  [facility];  Service: Cardiovascular;  Laterality: N/A;  . KNEE ARTHROPLASTY Right 11/02/2019   Procedure: COMPUTER ASSISTED TOTAL KNEE ARTHROPLASTY;  Surgeon: HDereck Leep MD;  Location: [facility];  Service: Orthopedics;  Laterality: Right;  . PATELLAR TENDON REPAIR Right 12/05/2019   Procedure: PATELLA TENDON REPAIR;  Surgeon: HDereck Leep MD;  Location: [facility];  Service: Orthopedics;  Laterality: Right;  . PROSTATE SURGERY      Home Medications:  Allergies as of 03/26/2020      Reactions   Shellfish Allergy Anaphylaxis, Other (See Comments)   gout   Iodinated Diagnostic Agents Other (See Comments)   Chronic kidney disease 3b   Nsaids Other (See Comments)   Chronic kidney disease 3b      Medication List       Accurate as of March 26, 2020 10:50 AM. If you have any questions, ask your nurse or doctor.        Accu-Chek FastClix Lancets Misc USE TO CHECK GLUCOSE TWICE DAILY   Accu-Chek Guide Me w/Device Kit 2 (two) times daily.   Accu-Chek Guide test strip Generic drug: glucose blood USE STRIP TO CHECK GLUCOSE TWICE DAILY   albuterol 108 (90 Base) MCG/ACT inhaler Commonly known as: VENTOLIN HFA Inhale 2 puffs into the lungs every 4 (four) hours as needed for wheezing.   allopurinol 100 MG tablet Commonly known as: ZYLOPRIM Take 100 mg by mouth daily.   Colace 100 MG capsule Generic drug: docusate sodium Take 100 mg by mouth daily as needed for mild constipation or moderate constipation.   diltiazem 120 MG 24 hr capsule Commonly known as: CARDIZEM CD Take 120 mg by mouth daily.   fexofenadine 180 MG tablet Commonly known as: ALLEGRA Take 180 mg by mouth daily as needed for  allergies.   fluticasone 50 MCG/ACT nasal spray Commonly known as: FLONASE Place 2 sprays into both nostrils daily as needed for allergies.   Fluticasone-Salmeterol 250-50 MCG/DOSE Aepb Commonly known as: ADVAIR Inhale 1 puff into the lungs 2 (two) times daily as needed (Wheezing).   levETIRAcetam 500 MG tablet Commonly known as: KEPPRA Take 500 mg by mouth 2 (two) times daily.   Magnesium Citrate 100 MG Tabs Take 100 mg by mouth daily.   melatonin 5 MG Tabs Take 5 mg by mouth.   metoprolol succinate 50 MG 24 hr tablet Commonly known as: TOPROL-XL Take 50 mg by mouth daily.   olmesartan 40 MG  tablet Commonly known as: BENICAR Take 40 mg by mouth daily. Give a  Half tablet daily   olmesartan-hydrochlorothiazide 40-12.5 MG tablet Commonly known as: BENICAR HCT Take 1 tablet by mouth daily.   ONE-A-DAY MENS PO Take 1 tablet by mouth daily.   oxyCODONE 5 MG immediate release tablet Commonly known as: Oxy IR/ROXICODONE Take 1 tablet (5 mg total) by mouth every 4 (four) hours as needed for moderate pain (pain score 4-6).   torsemide 20 MG tablet Commonly known as: DEMADEX Take 20 mg by mouth daily.   triamcinolone cream 0.1 % Commonly known as: KENALOG APPLY TOPICALLY TWICE DAILY AS NEEDED   vitamin C 500 MG tablet Commonly known as: ASCORBIC ACID Take 500 mg by mouth daily.   Xarelto 20 MG Tabs tablet Generic drug: rivaroxaban Take 20 mg by mouth daily.       Allergies:  Allergies  Allergen Reactions  . Shellfish Allergy Anaphylaxis and Other (See Comments)    gout  . Iodinated Diagnostic Agents Other (See Comments)    Chronic kidney disease 3b  . Nsaids Other (See Comments)    Chronic kidney disease 3b    Family History: Family History  Problem Relation Age of Onset  . Arthritis Mother   . Hypertension Mother   . Diabetes Brother     Social History:   reports that he quit smoking about 51 years ago. His smoking use included cigarettes. He has a 2.00 pack-year smoking history. He has never used smokeless tobacco. He reports that he does not drink alcohol and does not use drugs.  Physical Exam: BP 133/72   Pulse 76   Ht 5' 11"  (1.803 m)   Wt 268 lb (121.6 kg)   BMI 37.38 kg/m   Constitutional:  Alert and oriented, no acute distress, nontoxic appearing HEENT: [place], AT Cardiovascular: No clubbing, cyanosis, or edema Respiratory: Normal respiratory effort, no increased work of breathing GU: Penile and scrotal skin intact without obvious breaks or drainage today.  Small cluster of 3-4 pedunculated lesions over the right posterior scrotum. A larger  patch of more densely-arranged sessile and pedunculated lesions noted in the midline perineum. All lesions condylomatous in appearance, each measuring less than 1 cm in diameter. Lymph: No inguinal lymphadenopathy Skin: No rashes, bruises or suspicious lesions Neurologic: Requires heavy assist for transfer, nonambulatory Psychiatric: Normal mood and affect  Assessment & Plan:   1. 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  Will place a referral to dermatology for further evaluation and treatment.  Low concern for genital herpes at this time, as lesions are not vesicular in appearance and patient denies associated pain. - Ambulatory referral to Dermatology  Return if symptoms worsen or fail to improve.  Debroah Loop, PA-C  [facility] [address]  [address], [place], [place] [address] [phone]

## 2020-04-09 ENCOUNTER — Ambulatory Visit: Payer: Medicare Other | Admitting: Podiatry

## 2020-04-09 ENCOUNTER — Other Ambulatory Visit: Payer: Self-pay

## 2020-04-09 ENCOUNTER — Encounter: Payer: Self-pay | Admitting: Podiatry

## 2020-04-09 ENCOUNTER — Ambulatory Visit: Payer: Medicare Other | Attending: Internal Medicine

## 2020-04-09 DIAGNOSIS — E1122 Type 2 diabetes mellitus with diabetic chronic kidney disease: Secondary | ICD-10-CM

## 2020-04-09 DIAGNOSIS — D689 Coagulation defect, unspecified: Secondary | ICD-10-CM | POA: Diagnosis not present

## 2020-04-09 DIAGNOSIS — N183 Chronic kidney disease, stage 3 unspecified: Secondary | ICD-10-CM

## 2020-04-09 DIAGNOSIS — Z23 Encounter for immunization: Secondary | ICD-10-CM

## 2020-04-09 DIAGNOSIS — N184 Chronic kidney disease, stage 4 (severe): Secondary | ICD-10-CM

## 2020-04-09 DIAGNOSIS — B351 Tinea unguium: Secondary | ICD-10-CM | POA: Diagnosis not present

## 2020-04-09 DIAGNOSIS — M79609 Pain in unspecified limb: Secondary | ICD-10-CM

## 2020-04-09 NOTE — Progress Notes (Signed)
   [id number]  Name:  [facility].    MRN: [id number] DOB: 1941/04/28  04/09/2020  Mr. Justin Shannon was observed post Covid-19 immunization for 15 minutes without incident. He was provided with Vaccine Information Sheet and instruction to access the V-Safe system.   Mr. Justin Shannon was instructed to call 911 with any severe reactions post vaccine: Marland Kitchen Difficulty breathing  . Swelling of face and throat  . A fast heartbeat  . A bad rash all over body  . Dizziness and weakness

## 2020-04-09 NOTE — Progress Notes (Signed)
This patient returns to my office for at risk foot care.  This patient requires this care by a professional since this patient will be at risk due to having  Venous insufficiency , DVT, Diabetes and  CKD-4  This patient is unable to cut nails himself since the patient cannot reach his nails.These nails are painful walking and wearing shoes.  This patient presents for at risk foot care today.  Patient is taking xarelto. This patient is brought to the office in a wheelchair accompanied by caregiver.  General Appearance  Alert, conversant and in no acute stress.  Vascular  Dorsalis pedis and posterior tibial  pulses are palpable  bilaterally.  Capillary return is within normal limits  bilaterally. Temperature is within normal limits  bilaterally.  Neurologic  Senn-Weinstein monofilament wire test within normal limits  bilaterally. Muscle power within normal limits bilaterally.  Nails Thick disfigured discolored nails with subungual debris  from hallux to fifth toes bilaterally. No evidence of bacterial infection or drainage bilaterally.  Orthopedic  No limitations of motion  feet .  No crepitus or effusions noted.  No bony pathology or digital deformities noted.  Skin  normotropic skin with no porokeratosis noted bilaterally.  No signs of infections or ulcers noted.     Onychomycosis  Pain in right toes  Pain in left toes  Consent was obtained for treatment procedures.   Mechanical debridement of nails 1-5  bilaterally performed with a nail nipper.  Filed with dremel without incident. No infection or ulcer.     Return office visit   3   months                   Told patient to return for periodic foot care and evaluation due to potential at risk complications.   Gardiner Barefoot DPM

## 2020-04-23 DIAGNOSIS — G4733 Obstructive sleep apnea (adult) (pediatric): Secondary | ICD-10-CM | POA: Diagnosis not present

## 2020-04-23 DIAGNOSIS — I890 Lymphedema, not elsewhere classified: Secondary | ICD-10-CM | POA: Diagnosis not present

## 2020-04-23 DIAGNOSIS — I872 Venous insufficiency (chronic) (peripheral): Secondary | ICD-10-CM | POA: Diagnosis not present

## 2020-04-23 DIAGNOSIS — E1151 Type 2 diabetes mellitus with diabetic peripheral angiopathy without gangrene: Secondary | ICD-10-CM | POA: Diagnosis not present

## 2020-04-23 DIAGNOSIS — M109 Gout, unspecified: Secondary | ICD-10-CM | POA: Diagnosis not present

## 2020-04-23 DIAGNOSIS — D631 Anemia in chronic kidney disease: Secondary | ICD-10-CM | POA: Diagnosis not present

## 2020-04-23 DIAGNOSIS — N184 Chronic kidney disease, stage 4 (severe): Secondary | ICD-10-CM | POA: Diagnosis not present

## 2020-04-23 DIAGNOSIS — E1122 Type 2 diabetes mellitus with diabetic chronic kidney disease: Secondary | ICD-10-CM | POA: Diagnosis not present

## 2020-04-23 DIAGNOSIS — J309 Allergic rhinitis, unspecified: Secondary | ICD-10-CM | POA: Diagnosis not present

## 2020-04-23 DIAGNOSIS — Z7901 Long term (current) use of anticoagulants: Secondary | ICD-10-CM | POA: Diagnosis not present

## 2020-04-23 DIAGNOSIS — Z96651 Presence of right artificial knee joint: Secondary | ICD-10-CM | POA: Diagnosis not present

## 2020-04-23 DIAGNOSIS — Z86718 Personal history of other venous thrombosis and embolism: Secondary | ICD-10-CM | POA: Diagnosis not present

## 2020-04-23 DIAGNOSIS — Z471 Aftercare following joint replacement surgery: Secondary | ICD-10-CM | POA: Diagnosis not present

## 2020-04-23 DIAGNOSIS — I10 Essential (primary) hypertension: Secondary | ICD-10-CM | POA: Diagnosis not present

## 2020-04-23 DIAGNOSIS — J449 Chronic obstructive pulmonary disease, unspecified: Secondary | ICD-10-CM | POA: Diagnosis not present

## 2020-04-23 DIAGNOSIS — Z87891 Personal history of nicotine dependence: Secondary | ICD-10-CM | POA: Diagnosis not present

## 2020-04-24 DIAGNOSIS — E1122 Type 2 diabetes mellitus with diabetic chronic kidney disease: Secondary | ICD-10-CM | POA: Diagnosis not present

## 2020-04-24 DIAGNOSIS — Z96651 Presence of right artificial knee joint: Secondary | ICD-10-CM | POA: Diagnosis not present

## 2020-04-24 DIAGNOSIS — I10 Essential (primary) hypertension: Secondary | ICD-10-CM | POA: Diagnosis not present

## 2020-04-24 DIAGNOSIS — Z471 Aftercare following joint replacement surgery: Secondary | ICD-10-CM | POA: Diagnosis not present

## 2020-04-24 DIAGNOSIS — Z87891 Personal history of nicotine dependence: Secondary | ICD-10-CM | POA: Diagnosis not present

## 2020-04-24 DIAGNOSIS — J309 Allergic rhinitis, unspecified: Secondary | ICD-10-CM | POA: Diagnosis not present

## 2020-04-24 DIAGNOSIS — J449 Chronic obstructive pulmonary disease, unspecified: Secondary | ICD-10-CM | POA: Diagnosis not present

## 2020-04-24 DIAGNOSIS — D631 Anemia in chronic kidney disease: Secondary | ICD-10-CM | POA: Diagnosis not present

## 2020-04-24 DIAGNOSIS — I890 Lymphedema, not elsewhere classified: Secondary | ICD-10-CM | POA: Diagnosis not present

## 2020-04-24 DIAGNOSIS — M109 Gout, unspecified: Secondary | ICD-10-CM | POA: Diagnosis not present

## 2020-04-24 DIAGNOSIS — G4733 Obstructive sleep apnea (adult) (pediatric): Secondary | ICD-10-CM | POA: Diagnosis not present

## 2020-04-24 DIAGNOSIS — N184 Chronic kidney disease, stage 4 (severe): Secondary | ICD-10-CM | POA: Diagnosis not present

## 2020-04-24 DIAGNOSIS — E1151 Type 2 diabetes mellitus with diabetic peripheral angiopathy without gangrene: Secondary | ICD-10-CM | POA: Diagnosis not present

## 2020-04-24 DIAGNOSIS — Z7901 Long term (current) use of anticoagulants: Secondary | ICD-10-CM | POA: Diagnosis not present

## 2020-04-24 DIAGNOSIS — I872 Venous insufficiency (chronic) (peripheral): Secondary | ICD-10-CM | POA: Diagnosis not present

## 2020-04-24 DIAGNOSIS — Z86718 Personal history of other venous thrombosis and embolism: Secondary | ICD-10-CM | POA: Diagnosis not present

## 2020-04-25 DIAGNOSIS — Z471 Aftercare following joint replacement surgery: Secondary | ICD-10-CM | POA: Diagnosis not present

## 2020-04-25 DIAGNOSIS — I890 Lymphedema, not elsewhere classified: Secondary | ICD-10-CM | POA: Diagnosis not present

## 2020-04-25 DIAGNOSIS — G4733 Obstructive sleep apnea (adult) (pediatric): Secondary | ICD-10-CM | POA: Diagnosis not present

## 2020-04-25 DIAGNOSIS — J309 Allergic rhinitis, unspecified: Secondary | ICD-10-CM | POA: Diagnosis not present

## 2020-04-25 DIAGNOSIS — Z87891 Personal history of nicotine dependence: Secondary | ICD-10-CM | POA: Diagnosis not present

## 2020-04-25 DIAGNOSIS — I872 Venous insufficiency (chronic) (peripheral): Secondary | ICD-10-CM | POA: Diagnosis not present

## 2020-04-25 DIAGNOSIS — Z7901 Long term (current) use of anticoagulants: Secondary | ICD-10-CM | POA: Diagnosis not present

## 2020-04-25 DIAGNOSIS — N184 Chronic kidney disease, stage 4 (severe): Secondary | ICD-10-CM | POA: Diagnosis not present

## 2020-04-25 DIAGNOSIS — Z96651 Presence of right artificial knee joint: Secondary | ICD-10-CM | POA: Diagnosis not present

## 2020-04-25 DIAGNOSIS — E1122 Type 2 diabetes mellitus with diabetic chronic kidney disease: Secondary | ICD-10-CM | POA: Diagnosis not present

## 2020-04-25 DIAGNOSIS — E1151 Type 2 diabetes mellitus with diabetic peripheral angiopathy without gangrene: Secondary | ICD-10-CM | POA: Diagnosis not present

## 2020-04-25 DIAGNOSIS — I10 Essential (primary) hypertension: Secondary | ICD-10-CM | POA: Diagnosis not present

## 2020-04-25 DIAGNOSIS — D631 Anemia in chronic kidney disease: Secondary | ICD-10-CM | POA: Diagnosis not present

## 2020-04-25 DIAGNOSIS — M109 Gout, unspecified: Secondary | ICD-10-CM | POA: Diagnosis not present

## 2020-04-25 DIAGNOSIS — Z86718 Personal history of other venous thrombosis and embolism: Secondary | ICD-10-CM | POA: Diagnosis not present

## 2020-04-25 DIAGNOSIS — J449 Chronic obstructive pulmonary disease, unspecified: Secondary | ICD-10-CM | POA: Diagnosis not present

## 2020-04-26 DIAGNOSIS — Z87891 Personal history of nicotine dependence: Secondary | ICD-10-CM | POA: Diagnosis not present

## 2020-04-26 DIAGNOSIS — E1122 Type 2 diabetes mellitus with diabetic chronic kidney disease: Secondary | ICD-10-CM | POA: Diagnosis not present

## 2020-04-26 DIAGNOSIS — Z7901 Long term (current) use of anticoagulants: Secondary | ICD-10-CM | POA: Diagnosis not present

## 2020-04-26 DIAGNOSIS — Z471 Aftercare following joint replacement surgery: Secondary | ICD-10-CM | POA: Diagnosis not present

## 2020-04-26 DIAGNOSIS — J309 Allergic rhinitis, unspecified: Secondary | ICD-10-CM | POA: Diagnosis not present

## 2020-04-26 DIAGNOSIS — Z96651 Presence of right artificial knee joint: Secondary | ICD-10-CM | POA: Diagnosis not present

## 2020-04-26 DIAGNOSIS — E1151 Type 2 diabetes mellitus with diabetic peripheral angiopathy without gangrene: Secondary | ICD-10-CM | POA: Diagnosis not present

## 2020-04-26 DIAGNOSIS — Z86718 Personal history of other venous thrombosis and embolism: Secondary | ICD-10-CM | POA: Diagnosis not present

## 2020-04-26 DIAGNOSIS — I890 Lymphedema, not elsewhere classified: Secondary | ICD-10-CM | POA: Diagnosis not present

## 2020-04-26 DIAGNOSIS — G4733 Obstructive sleep apnea (adult) (pediatric): Secondary | ICD-10-CM | POA: Diagnosis not present

## 2020-04-26 DIAGNOSIS — D631 Anemia in chronic kidney disease: Secondary | ICD-10-CM | POA: Diagnosis not present

## 2020-04-26 DIAGNOSIS — N184 Chronic kidney disease, stage 4 (severe): Secondary | ICD-10-CM | POA: Diagnosis not present

## 2020-04-26 DIAGNOSIS — M109 Gout, unspecified: Secondary | ICD-10-CM | POA: Diagnosis not present

## 2020-04-26 DIAGNOSIS — I10 Essential (primary) hypertension: Secondary | ICD-10-CM | POA: Diagnosis not present

## 2020-04-26 DIAGNOSIS — I872 Venous insufficiency (chronic) (peripheral): Secondary | ICD-10-CM | POA: Diagnosis not present

## 2020-04-26 DIAGNOSIS — J449 Chronic obstructive pulmonary disease, unspecified: Secondary | ICD-10-CM | POA: Diagnosis not present

## 2020-04-30 DIAGNOSIS — N184 Chronic kidney disease, stage 4 (severe): Secondary | ICD-10-CM | POA: Diagnosis not present

## 2020-04-30 DIAGNOSIS — G4733 Obstructive sleep apnea (adult) (pediatric): Secondary | ICD-10-CM | POA: Diagnosis not present

## 2020-04-30 DIAGNOSIS — E1151 Type 2 diabetes mellitus with diabetic peripheral angiopathy without gangrene: Secondary | ICD-10-CM | POA: Diagnosis not present

## 2020-04-30 DIAGNOSIS — D631 Anemia in chronic kidney disease: Secondary | ICD-10-CM | POA: Diagnosis not present

## 2020-04-30 DIAGNOSIS — Z86718 Personal history of other venous thrombosis and embolism: Secondary | ICD-10-CM | POA: Diagnosis not present

## 2020-04-30 DIAGNOSIS — I872 Venous insufficiency (chronic) (peripheral): Secondary | ICD-10-CM | POA: Diagnosis not present

## 2020-04-30 DIAGNOSIS — I10 Essential (primary) hypertension: Secondary | ICD-10-CM | POA: Diagnosis not present

## 2020-04-30 DIAGNOSIS — J449 Chronic obstructive pulmonary disease, unspecified: Secondary | ICD-10-CM | POA: Diagnosis not present

## 2020-04-30 DIAGNOSIS — Z471 Aftercare following joint replacement surgery: Secondary | ICD-10-CM | POA: Diagnosis not present

## 2020-04-30 DIAGNOSIS — Z87891 Personal history of nicotine dependence: Secondary | ICD-10-CM | POA: Diagnosis not present

## 2020-04-30 DIAGNOSIS — Z96651 Presence of right artificial knee joint: Secondary | ICD-10-CM | POA: Diagnosis not present

## 2020-04-30 DIAGNOSIS — Z7901 Long term (current) use of anticoagulants: Secondary | ICD-10-CM | POA: Diagnosis not present

## 2020-04-30 DIAGNOSIS — E1122 Type 2 diabetes mellitus with diabetic chronic kidney disease: Secondary | ICD-10-CM | POA: Diagnosis not present

## 2020-04-30 DIAGNOSIS — I890 Lymphedema, not elsewhere classified: Secondary | ICD-10-CM | POA: Diagnosis not present

## 2020-04-30 DIAGNOSIS — J309 Allergic rhinitis, unspecified: Secondary | ICD-10-CM | POA: Diagnosis not present

## 2020-04-30 DIAGNOSIS — M109 Gout, unspecified: Secondary | ICD-10-CM | POA: Diagnosis not present

## 2020-05-01 DIAGNOSIS — J449 Chronic obstructive pulmonary disease, unspecified: Secondary | ICD-10-CM | POA: Diagnosis not present

## 2020-05-01 DIAGNOSIS — N184 Chronic kidney disease, stage 4 (severe): Secondary | ICD-10-CM | POA: Diagnosis not present

## 2020-05-01 DIAGNOSIS — I890 Lymphedema, not elsewhere classified: Secondary | ICD-10-CM | POA: Diagnosis not present

## 2020-05-01 DIAGNOSIS — D631 Anemia in chronic kidney disease: Secondary | ICD-10-CM | POA: Diagnosis not present

## 2020-05-01 DIAGNOSIS — Z471 Aftercare following joint replacement surgery: Secondary | ICD-10-CM | POA: Diagnosis not present

## 2020-05-01 DIAGNOSIS — Z86718 Personal history of other venous thrombosis and embolism: Secondary | ICD-10-CM | POA: Diagnosis not present

## 2020-05-01 DIAGNOSIS — I10 Essential (primary) hypertension: Secondary | ICD-10-CM | POA: Diagnosis not present

## 2020-05-01 DIAGNOSIS — E1151 Type 2 diabetes mellitus with diabetic peripheral angiopathy without gangrene: Secondary | ICD-10-CM | POA: Diagnosis not present

## 2020-05-01 DIAGNOSIS — I872 Venous insufficiency (chronic) (peripheral): Secondary | ICD-10-CM | POA: Diagnosis not present

## 2020-05-01 DIAGNOSIS — Z7901 Long term (current) use of anticoagulants: Secondary | ICD-10-CM | POA: Diagnosis not present

## 2020-05-01 DIAGNOSIS — E1122 Type 2 diabetes mellitus with diabetic chronic kidney disease: Secondary | ICD-10-CM | POA: Diagnosis not present

## 2020-05-01 DIAGNOSIS — M109 Gout, unspecified: Secondary | ICD-10-CM | POA: Diagnosis not present

## 2020-05-01 DIAGNOSIS — Z96651 Presence of right artificial knee joint: Secondary | ICD-10-CM | POA: Diagnosis not present

## 2020-05-01 DIAGNOSIS — G4733 Obstructive sleep apnea (adult) (pediatric): Secondary | ICD-10-CM | POA: Diagnosis not present

## 2020-05-01 DIAGNOSIS — Z87891 Personal history of nicotine dependence: Secondary | ICD-10-CM | POA: Diagnosis not present

## 2020-05-01 DIAGNOSIS — J309 Allergic rhinitis, unspecified: Secondary | ICD-10-CM | POA: Diagnosis not present

## 2020-05-02 ENCOUNTER — Emergency Department: Payer: Medicare Other

## 2020-05-02 ENCOUNTER — Encounter: Payer: Self-pay | Admitting: Emergency Medicine

## 2020-05-02 ENCOUNTER — Other Ambulatory Visit: Payer: Self-pay

## 2020-05-02 ENCOUNTER — Emergency Department
Admission: EM | Admit: 2020-05-02 | Discharge: 2020-05-02 | Disposition: A | Discharge status: Home / Self Care | Payer: Medicare Other | Attending: Emergency Medicine | Admitting: Emergency Medicine

## 2020-05-02 DIAGNOSIS — E1122 Type 2 diabetes mellitus with diabetic chronic kidney disease: Secondary | ICD-10-CM | POA: Insufficient documentation

## 2020-05-02 DIAGNOSIS — R531 Weakness: Secondary | ICD-10-CM | POA: Insufficient documentation

## 2020-05-02 DIAGNOSIS — Z20822 Contact with and (suspected) exposure to covid-19: Secondary | ICD-10-CM | POA: Insufficient documentation

## 2020-05-02 DIAGNOSIS — I129 Hypertensive chronic kidney disease with stage 1 through stage 4 chronic kidney disease, or unspecified chronic kidney disease: Secondary | ICD-10-CM | POA: Insufficient documentation

## 2020-05-02 DIAGNOSIS — R0609 Other forms of dyspnea: Secondary | ICD-10-CM | POA: Insufficient documentation

## 2020-05-02 DIAGNOSIS — J9811 Atelectasis: Secondary | ICD-10-CM | POA: Diagnosis not present

## 2020-05-02 DIAGNOSIS — Z8616 Personal history of COVID-19: Secondary | ICD-10-CM | POA: Insufficient documentation

## 2020-05-02 DIAGNOSIS — J90 Pleural effusion, not elsewhere classified: Secondary | ICD-10-CM | POA: Diagnosis not present

## 2020-05-02 DIAGNOSIS — Z7901 Long term (current) use of anticoagulants: Secondary | ICD-10-CM | POA: Insufficient documentation

## 2020-05-02 DIAGNOSIS — Z87891 Personal history of nicotine dependence: Secondary | ICD-10-CM | POA: Insufficient documentation

## 2020-05-02 DIAGNOSIS — R5383 Other fatigue: Secondary | ICD-10-CM | POA: Diagnosis present

## 2020-05-02 DIAGNOSIS — Z79899 Other long term (current) drug therapy: Secondary | ICD-10-CM | POA: Insufficient documentation

## 2020-05-02 DIAGNOSIS — R0600 Dyspnea, unspecified: Secondary | ICD-10-CM

## 2020-05-02 DIAGNOSIS — N184 Chronic kidney disease, stage 4 (severe): Secondary | ICD-10-CM | POA: Insufficient documentation

## 2020-05-02 DIAGNOSIS — R0602 Shortness of breath: Secondary | ICD-10-CM | POA: Diagnosis not present

## 2020-05-02 DIAGNOSIS — Z96651 Presence of right artificial knee joint: Secondary | ICD-10-CM | POA: Insufficient documentation

## 2020-05-02 DIAGNOSIS — R410 Disorientation, unspecified: Secondary | ICD-10-CM | POA: Diagnosis not present

## 2020-05-02 LAB — RESPIRATORY PANEL BY RT PCR (FLU A&B, COVID)
Influenza A by PCR: NEGATIVE
Influenza B by PCR: NEGATIVE
SARS Coronavirus 2 by RT PCR: NEGATIVE

## 2020-05-02 MED ORDER — LACTATED RINGERS IV BOLUS
1000.0000 mL | Freq: Once | INTRAVENOUS | Status: AC
  Administered 2020-05-02: 1000 mL via INTRAVENOUS

## 2020-05-02 NOTE — ED Triage Notes (Signed)
Pt comes into the ED via POV from the [facility] clinic with c/o weakness and possible dehydration.  Blood work completed over there.  Pt is currently neurologically intact at this time.  Pt started having urinary frequency 2 days ago and then the confusion started last night.  Pt has had decreased appetite as well.  Pt in NAD at this time with even and unlabored respirations at this time.

## 2020-05-02 NOTE — ED Notes (Signed)
MD called and stated we could hold off on blood work at this time and it would be accessed through care everywhere. ([place])

## 2020-05-02 NOTE — ED Triage Notes (Signed)
First RN note: Pt presents to ED via wheelchair from [facility] with c/o "confusion, headache, possible dehydration" per [facility] staff member. Pt had blood work done at [facility] and per blood work pt with noted hemoglobin of 9.7, and a creatinine of 2.0.

## 2020-05-02 NOTE — Discharge Instructions (Signed)
Your chest xray and Covid/Flu test today were all okay.  Your blood tests and urine test also did not show any new issues.  Your symptoms appear to be related to fatigue from the stress of physical therapy.  You should continue daily physical therapy, but you may need to allow for a less strenuous program for the next few days to recover your energy level.

## 2020-05-02 NOTE — ED Provider Notes (Signed)
[facility] Emergency Department Provider Note  ____________________________________________  Time seen: Approximately 2:52 PM  I have reviewed the triage vital signs and the nursing notes.   HISTORY  Chief Complaint Weakness    HPI Justin Dahm. is a 79 y.o. male with a history of right lower extremity DVT, diabetes, hypertension, obesity who comes the ED due to worsened fatigue and possible dehydration.  Patient had a right knee replacement back in May 4709 that was complicated by DVT and patellar tendon rupture.  He has a knee immobilizer in place.  He was in rehab and now is home with PT.  He has an IVC filter in place and is on  Xarelto with which she has been compliant.  Denies chest pain or shortness of breath, but does get winded quickly with ambulation.  Family member at bedside states they feel that his weakness is worse in the last 2 weeks.  He does have cervical radiculopathy, being referred to pain management due to not being a surgical candidate.  No new falls.  [name] been eating okay.  Does note increased urinary frequency and some incontinence.     Past Medical History:  Diagnosis Date  . Arthritis   . Bilateral swelling of feet   . BPH (benign prostatic hyperplasia)   . Chronic kidney disease   . Diabetes mellitus without complication ([place])   . DVT (deep venous thrombosis) (HCC)    right lower extremity  . History of gout   . Hypertension   . Seizures ([place])   . Sleep apnea   . Tendonitis of foot    left     Patient Active Problem List   Diagnosis Date Noted  . Status post total right knee replacement 01/22/2020  . Acute renal failure syndrome ([place]) 01/16/2020  . Anemia of chronic renal failure 01/16/2020  . Patellar tendon rupture, right, initial encounter 12/05/2019  . Rupture of patellar tendon 12/05/2019  . Total knee replacement status 11/02/2019  . History of total knee arthroplasty 11/02/2019  . PAD  (peripheral artery disease) ([place]) 10/13/2019  . Peripheral vascular disease ([place]) 10/13/2019  . Hyperkalemia   . Acute kidney injury superimposed on CKD ([place])   . Anemia of chronic renal failure, stage 3a ([place])   . Benign prostatic hyperplasia   . Seizure ([place])   . Pneumonia due to COVID-19 virus 06/24/2019  . Respiratory failure with hypoxia ([place]) 06/24/2019  . Anemia due to stage 4 chronic kidney disease ([place]) 06/24/2019  . Essential hypertension 06/24/2019  . Generalized weakness 06/24/2019  . Anemia in chronic kidney disease (CODE) 06/24/2019  . Asthenia 06/24/2019  . Pneumonia due to coronavirus disease 2019 06/24/2019  . Primary osteoarthritis of right knee 04/03/2019  . Venous ulcer of ankle, right ([place]) 05/08/2018  . Ankle ulcer ([place]) 05/08/2018  . Use of cane as ambulatory aid 04/27/2018  . Dependence on other enabling machines and devices 04/27/2018  . Type 2 diabetes mellitus with stage 4 chronic kidney disease, without long-term current use of insulin ([place]) 06/11/2017  . Type 2 diabetes mellitus ([place]) 06/11/2017  . Lymphedema 05/04/2016  . Chronic venous insufficiency 05/04/2016  . Toe sprain 01/24/2016  . Injury of toe 01/24/2016  . Health care maintenance 12/11/2014  . Morbid (severe) obesity due to excess calories ([place]) 05/24/2014  . Morbid obesity with body mass index of 40.0-44.9 in adult [facility]) 05/24/2014  . Body mass index (BMI)40.0-44.9, adult 05/24/2014  . OSA on CPAP 01/10/2014  . Chronic  kidney disease (CKD), stage III (moderate) ([place]) 11/22/2013  . Allergic rhinitis 11/06/2013  . Benign hypertension with chronic kidney disease, stage III ([place]) 11/06/2013  . 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 Gout 11/06/2013  . History of DVT (deep vein thrombosis) 11/06/2013  . 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 Chronic deep vein thrombosis (DVT) of proximal vein of lower extremity ([place]) 11/06/2013  . Chronic embolism and thrombosis of unspecified deep veins of unspecified  proximal lower extremity ([place]) 11/06/2013  . Personal history of other venous thrombosis and embolism 11/06/2013  . 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 APPENDECTOMY    . APPLICATION OF WOUND VAC Right 11/02/2019   Procedure: APPLICATION OF WOUND VAC;  Surgeon: Dereck Leep, MD;  Location: [facility] ORS;  Service: Orthopedics;  Laterality: Right;  [id number]  . HERNIA REPAIR     UMBILICAL  . IVC FILTER INSERTION N/A 10/25/2019   Procedure: IVC FILTER INSERTION;  Surgeon: Katha Cabal, MD;  Location: [facility];  Service: Cardiovascular;  Laterality: N/A;  . KNEE ARTHROPLASTY Right 11/02/2019   Procedure: COMPUTER ASSISTED TOTAL KNEE ARTHROPLASTY;  Surgeon: Dereck Leep, MD;  Location: [facility] ORS;  Service: Orthopedics;  Laterality: Right;  . PATELLAR TENDON REPAIR Right 12/05/2019   Procedure: PATELLA TENDON REPAIR;  Surgeon: Dereck Leep, MD;  Location: [facility] ORS;  Service: Orthopedics;  Laterality: Right;  . PROSTATE SURGERY       Prior to Admission medications   Medication Sig Start Date End Date Taking? Authorizing Provider  Accu-Chek FastClix Lancets MISC USE TO CHECK GLUCOSE TWICE DAILY 06/07/19   [provider]  ACCU-CHEK GUIDE test strip USE STRIP TO [place] DAILY 06/07/19   [provider]  albuterol (VENTOLIN HFA) 108 (90 Base) MCG/ACT inhaler Inhale 2 puffs into the lungs every 4 (four) hours as needed for wheezing.    [provider]  allopurinol (ZYLOPRIM) 100 MG tablet Take 100 mg by mouth daily.    [provider]  Blood Glucose Monitoring Suppl (ACCU-CHEK GUIDE ME) w/Device KIT 2 (two) times daily. 06/07/19   [provider]  diltiazem (CARDIZEM CD) 120 MG 24 hr capsule Take 120 mg by mouth daily.  09/30/19   [provider]  docusate sodium (COLACE) 100 MG capsule Take 100 mg by mouth daily as needed for mild constipation or moderate  constipation.     [provider]  fexofenadine (ALLEGRA) 180 MG tablet Take 180 mg by mouth daily as needed for allergies.     [provider]  fluticasone (FLONASE) 50 MCG/ACT nasal spray Place 2 sprays into both nostrils daily as needed for allergies.  09/26/14   [provider]  Fluticasone-Salmeterol (ADVAIR) 250-50 MCG/DOSE AEPB Inhale 1 puff into the lungs 2 (two) times daily as needed (Wheezing).    [provider]  levETIRAcetam (KEPPRA) 500 MG tablet Take 500 mg by mouth 2 (two) times daily.  03/08/19   [provider]  LORazepam (ATIVAN) 1 MG tablet TAKE 1 TABLET BY MOUTH AS DIRECTED FOR ANXIETY. TAKE 1 TABLET 60 MINUTES BEFORE MRI AND 1 TABLET 15 MINUTES BEFORE MRI IF NEEDED. YOU SHOULD NOT DRIVE UNDER THE INFLUENCE OF THIS MEDICATION. THIS CAN CAUSE SIGNIFICANT SEDATION FOR LONG TIME. 03/16/20   [provider]  Magnesium Citrate 100 MG TABS Take 100 mg by mouth daily.    [provider]  melatonin 5 MG TABS Take 5  mg by mouth.    [provider]  metoprolol succinate (TOPROL-XL) 50 MG 24 hr tablet Take 50 mg by mouth daily.  10/30/17   [provider]  Multiple Vitamin (ONE-A-DAY MENS PO) Take 1 tablet by mouth daily.     [provider]  olmesartan (BENICAR) 40 MG tablet Take 40 mg by mouth daily. Give a  Half tablet daily    [provider]  olmesartan-hydrochlorothiazide (BENICAR HCT) 40-12.5 MG tablet Take 1 tablet by mouth daily.     [provider]  oxyCODONE (OXY IR/ROXICODONE) 5 MG immediate release tablet Take 1 tablet (5 mg total) by mouth every 4 (four) hours as needed for moderate pain (pain score 4-6). 12/08/19   Fausto Skillern, PA-C  rivaroxaban (XARELTO) 20 MG TABS tablet Take 20 mg by mouth daily.  01/20/19   [provider]  torsemide (DEMADEX) 20 MG tablet Take 20 mg by mouth daily. 08/09/19   [provider]  triamcinolone cream (KENALOG) 0.1 % APPLY  TOPICALLY TWICE DAILY AS NEEDED 03/07/20   [provider]  vitamin C (ASCORBIC ACID) 500 MG tablet Take 500 mg by mouth daily.     [provider]     Allergies Shellfish allergy, Iodinated diagnostic agents, and Nsaids   Family History  Problem Relation Age of Onset  . Arthritis Mother   . Hypertension Mother   . Diabetes Brother     Social History Social History   Tobacco Use  . Smoking status: Former Smoker    Packs/day: 1.00    Years: 2.00    Pack years: 2.00    Types: Cigarettes    Quit date: 06/23/1968    Years since quitting: 51.8  . Smokeless tobacco: Never Used  . Tobacco comment: 25 years ago quit  Vaping Use  . Vaping Use: Never used  Substance Use Topics  . Alcohol use: No  . Drug use: No    Review of Systems  Constitutional:   No fever or chills.  ENT:   No sore throat. No rhinorrhea. Cardiovascular:   No chest pain or syncope. Respiratory:   No dyspnea or cough. Gastrointestinal:   Negative for abdominal pain, vomiting and diarrhea.  Musculoskeletal:   Negative for focal pain or swelling All other systems reviewed and are negative except as documented above in ROS and HPI.  ____________________________________________   PHYSICAL EXAM:  VITAL SIGNS: ED Triage Vitals  Enc Vitals Group     BP 05/02/20 1218 (!) 153/64     Pulse Rate 05/02/20 1218 66     Resp 05/02/20 1218 18     Temp 05/02/20 1218 97.8 F (36.6 C)     Temp Source 05/02/20 1218 Oral     SpO2 05/02/20 1218 100 %     Weight 05/02/20 1215 260 lb (117.9 kg)     Height 05/02/20 1215 5' 10"  (1.778 m)     Head Circumference --      Peak Flow --      Pain Score 05/02/20 1215 5     Pain Loc --      Pain Edu? --      Excl. in [place]? --     Vital signs reviewed, nursing assessments reviewed.   Constitutional:   Alert and oriented. Non-toxic appearance. Eyes:   Conjunctivae are normal. EOMI. PERRL. ENT      Head:   Normocephalic and atraumatic.      Nose:    Wearing a mask.  Mouth/Throat:   Wearing a mask.      Neck:   No meningismus. Full ROM. Hematological/Lymphatic/Immunilogical:   No cervical lymphadenopathy. Cardiovascular:   RRR. Symmetric bilateral radial and DP pulses.  No murmurs. Cap refill less than 2 seconds. Respiratory:   Normal respiratory effort without tachypnea/retractions. Breath sounds are clear and equal bilaterally. No wheezes/rales/rhonchi. Gastrointestinal:   Soft and nontender. Non distended. There is no CVA tenderness.  No rebound, rigidity, or guarding. Musculoskeletal:   Normal range of motion in all extremities. No joint effusions.  No lower extremity tenderness.  Chronic lymphedema bilateral lower extremities.  Symmetric calf circumference. Neurologic:   Normal speech and language.  Motor grossly intact. No acute focal neurologic deficits are appreciated.  Skin:    Skin is warm, dry and intact. No rash noted.  No petechiae, purpura, or bullae.  ____________________________________________    LABS (pertinent positives/negatives) (all labs ordered are listed, but only abnormal results are displayed) Labs Reviewed  RESPIRATORY PANEL BY RT PCR (FLU A&B, COVID)   ____________________________________________   EKG  Interpreted by me Normal sinus rhythm rate of 60, normal axis and intervals.  Normal QRS ST segments and T waves.  ____________________________________________    [id number]  DG Chest Portable 1 View  Result Date: 05/02/2020 CLINICAL DATA:  Confusion and weakness. EXAM: PORTABLE CHEST 1 VIEW COMPARISON:  Chest x-ray dated Nov 04, 2019. FINDINGS: Stable cardiomediastinal silhouette. Persistent low lung volumes with bibasilar atelectasis. No focal consolidation, pleural effusion, or pneumothorax. No acute osseous abnormality. IMPRESSION: 1. Persistent low lung volumes with bibasilar atelectasis. Electronically Signed   By: Titus Dubin M.D.   On: 05/02/2020 14:42     ____________________________________________   PROCEDURES Procedures  ____________________________________________  DIFFERENTIAL DIAGNOSIS   Pneumonia, pleural effusion, electrolyte abnormality, anemia, Covid, UTI  CLINICAL IMPRESSION / ASSESSMENT AND PLAN / ED COURSE  Medications ordered in the ED: Medications  lactated ringers bolus 1,000 mL (1,000 mLs Intravenous New Bag/Given 05/02/20 1312)    Pertinent labs & imaging results that were available during my care of the patient were reviewed by me and considered in my medical decision making (see chart for details).  [facility]. was evaluated in Emergency Department on 05/02/2020 for the symptoms described in the history of present illness. He was evaluated in the context of the global COVID-19 pandemic, which necessitated consideration that the patient might be at risk for infection with the SARS-CoV-2 virus that causes COVID-19. Institutional protocols and algorithms that pertain to the evaluation of patients at risk for COVID-19 are in a state of rapid change based on information released by regulatory bodies including the CDC and federal and state organizations. These policies and algorithms were followed during the patient's care in the ED.   Patient presents with generalized weakness, subacute on chronic in the setting of deconditioning and complicated course after knee replacement 6 months ago.  Vital signs unremarkable, exam is nonfocal.  Patient has an IVC filter and Xarelto so DVT and PE are very unlikely, especially with normal vital signs and reassuring exam.  Doubt ACS or dissection.  Bladder scan showed a volume of 30.  Urinalysis is normal from [facility] today.  His serum labs from [facility] are at baseline with stable CKD and chronic anemia.  Chest x-ray image reviewed by me, unremarkable.  Radiology report agrees no acute findings.Covid test is negative.  Covid test is negative.  Patient appears  stable for continued outpatient follow-up and home health PT, will discuss with  family regarding feasibility of this plan.  ----------------------------------------- 3:50 PM on 05/02/2020 -----------------------------------------  Family in agreement that symptoms are likely due to fatigue, comfortable with continued outpatient management.      ____________________________________________   FINAL CLINICAL IMPRESSION(S) / ED DIAGNOSES    Final diagnoses:  Generalized weakness  DOE (dyspnea on exertion)     ED Discharge Orders    None      Portions of this note were generated with dragon dictation software. Dictation errors may occur despite best attempts at proofreading.   Carrie Mew, MD 05/02/20 1550

## 2020-05-02 NOTE — ED Notes (Signed)
Sandwich tray and beverage provided

## 2020-05-03 DIAGNOSIS — G4733 Obstructive sleep apnea (adult) (pediatric): Secondary | ICD-10-CM | POA: Diagnosis not present

## 2020-05-03 DIAGNOSIS — J309 Allergic rhinitis, unspecified: Secondary | ICD-10-CM | POA: Diagnosis not present

## 2020-05-03 DIAGNOSIS — Z86718 Personal history of other venous thrombosis and embolism: Secondary | ICD-10-CM | POA: Diagnosis not present

## 2020-05-03 DIAGNOSIS — Z7901 Long term (current) use of anticoagulants: Secondary | ICD-10-CM | POA: Diagnosis not present

## 2020-05-03 DIAGNOSIS — E1122 Type 2 diabetes mellitus with diabetic chronic kidney disease: Secondary | ICD-10-CM | POA: Diagnosis not present

## 2020-05-03 DIAGNOSIS — Z96651 Presence of right artificial knee joint: Secondary | ICD-10-CM | POA: Diagnosis not present

## 2020-05-03 DIAGNOSIS — Z471 Aftercare following joint replacement surgery: Secondary | ICD-10-CM | POA: Diagnosis not present

## 2020-05-03 DIAGNOSIS — J449 Chronic obstructive pulmonary disease, unspecified: Secondary | ICD-10-CM | POA: Diagnosis not present

## 2020-05-03 DIAGNOSIS — I890 Lymphedema, not elsewhere classified: Secondary | ICD-10-CM | POA: Diagnosis not present

## 2020-05-03 DIAGNOSIS — I10 Essential (primary) hypertension: Secondary | ICD-10-CM | POA: Diagnosis not present

## 2020-05-03 DIAGNOSIS — E1151 Type 2 diabetes mellitus with diabetic peripheral angiopathy without gangrene: Secondary | ICD-10-CM | POA: Diagnosis not present

## 2020-05-03 DIAGNOSIS — Z87891 Personal history of nicotine dependence: Secondary | ICD-10-CM | POA: Diagnosis not present

## 2020-05-03 DIAGNOSIS — N184 Chronic kidney disease, stage 4 (severe): Secondary | ICD-10-CM | POA: Diagnosis not present

## 2020-05-03 DIAGNOSIS — M109 Gout, unspecified: Secondary | ICD-10-CM | POA: Diagnosis not present

## 2020-05-03 DIAGNOSIS — I872 Venous insufficiency (chronic) (peripheral): Secondary | ICD-10-CM | POA: Diagnosis not present

## 2020-05-03 DIAGNOSIS — D631 Anemia in chronic kidney disease: Secondary | ICD-10-CM | POA: Diagnosis not present

## 2020-05-08 DIAGNOSIS — I872 Venous insufficiency (chronic) (peripheral): Secondary | ICD-10-CM | POA: Diagnosis not present

## 2020-05-08 DIAGNOSIS — J309 Allergic rhinitis, unspecified: Secondary | ICD-10-CM | POA: Diagnosis not present

## 2020-05-08 DIAGNOSIS — I890 Lymphedema, not elsewhere classified: Secondary | ICD-10-CM | POA: Diagnosis not present

## 2020-05-08 DIAGNOSIS — Z7901 Long term (current) use of anticoagulants: Secondary | ICD-10-CM | POA: Diagnosis not present

## 2020-05-08 DIAGNOSIS — Z96651 Presence of right artificial knee joint: Secondary | ICD-10-CM | POA: Diagnosis not present

## 2020-05-08 DIAGNOSIS — J449 Chronic obstructive pulmonary disease, unspecified: Secondary | ICD-10-CM | POA: Diagnosis not present

## 2020-05-08 DIAGNOSIS — G4733 Obstructive sleep apnea (adult) (pediatric): Secondary | ICD-10-CM | POA: Diagnosis not present

## 2020-05-08 DIAGNOSIS — Z87891 Personal history of nicotine dependence: Secondary | ICD-10-CM | POA: Diagnosis not present

## 2020-05-08 DIAGNOSIS — N184 Chronic kidney disease, stage 4 (severe): Secondary | ICD-10-CM | POA: Diagnosis not present

## 2020-05-08 DIAGNOSIS — Z471 Aftercare following joint replacement surgery: Secondary | ICD-10-CM | POA: Diagnosis not present

## 2020-05-08 DIAGNOSIS — Z86718 Personal history of other venous thrombosis and embolism: Secondary | ICD-10-CM | POA: Diagnosis not present

## 2020-05-08 DIAGNOSIS — D631 Anemia in chronic kidney disease: Secondary | ICD-10-CM | POA: Diagnosis not present

## 2020-05-08 DIAGNOSIS — I10 Essential (primary) hypertension: Secondary | ICD-10-CM | POA: Diagnosis not present

## 2020-05-08 DIAGNOSIS — E1122 Type 2 diabetes mellitus with diabetic chronic kidney disease: Secondary | ICD-10-CM | POA: Diagnosis not present

## 2020-05-08 DIAGNOSIS — M109 Gout, unspecified: Secondary | ICD-10-CM | POA: Diagnosis not present

## 2020-05-08 DIAGNOSIS — E1151 Type 2 diabetes mellitus with diabetic peripheral angiopathy without gangrene: Secondary | ICD-10-CM | POA: Diagnosis not present

## 2020-05-09 DIAGNOSIS — I872 Venous insufficiency (chronic) (peripheral): Secondary | ICD-10-CM | POA: Diagnosis not present

## 2020-05-09 DIAGNOSIS — E1122 Type 2 diabetes mellitus with diabetic chronic kidney disease: Secondary | ICD-10-CM | POA: Diagnosis not present

## 2020-05-09 DIAGNOSIS — J449 Chronic obstructive pulmonary disease, unspecified: Secondary | ICD-10-CM | POA: Diagnosis not present

## 2020-05-09 DIAGNOSIS — Z471 Aftercare following joint replacement surgery: Secondary | ICD-10-CM | POA: Diagnosis not present

## 2020-05-09 DIAGNOSIS — N184 Chronic kidney disease, stage 4 (severe): Secondary | ICD-10-CM | POA: Diagnosis not present

## 2020-05-09 DIAGNOSIS — E1151 Type 2 diabetes mellitus with diabetic peripheral angiopathy without gangrene: Secondary | ICD-10-CM | POA: Diagnosis not present

## 2020-05-09 DIAGNOSIS — M109 Gout, unspecified: Secondary | ICD-10-CM | POA: Diagnosis not present

## 2020-05-09 DIAGNOSIS — Z7901 Long term (current) use of anticoagulants: Secondary | ICD-10-CM | POA: Diagnosis not present

## 2020-05-09 DIAGNOSIS — J309 Allergic rhinitis, unspecified: Secondary | ICD-10-CM | POA: Diagnosis not present

## 2020-05-09 DIAGNOSIS — I10 Essential (primary) hypertension: Secondary | ICD-10-CM | POA: Diagnosis not present

## 2020-05-09 DIAGNOSIS — I890 Lymphedema, not elsewhere classified: Secondary | ICD-10-CM | POA: Diagnosis not present

## 2020-05-09 DIAGNOSIS — D631 Anemia in chronic kidney disease: Secondary | ICD-10-CM | POA: Diagnosis not present

## 2020-05-09 DIAGNOSIS — G4733 Obstructive sleep apnea (adult) (pediatric): Secondary | ICD-10-CM | POA: Diagnosis not present

## 2020-05-09 DIAGNOSIS — Z96651 Presence of right artificial knee joint: Secondary | ICD-10-CM | POA: Diagnosis not present

## 2020-05-09 DIAGNOSIS — Z86718 Personal history of other venous thrombosis and embolism: Secondary | ICD-10-CM | POA: Diagnosis not present

## 2020-05-09 DIAGNOSIS — Z87891 Personal history of nicotine dependence: Secondary | ICD-10-CM | POA: Diagnosis not present

## 2020-05-10 ENCOUNTER — Other Ambulatory Visit: Payer: Self-pay | Admitting: Internal Medicine

## 2020-05-10 DIAGNOSIS — R569 Unspecified convulsions: Secondary | ICD-10-CM | POA: Diagnosis not present

## 2020-05-10 DIAGNOSIS — I872 Venous insufficiency (chronic) (peripheral): Secondary | ICD-10-CM | POA: Diagnosis not present

## 2020-05-10 DIAGNOSIS — I890 Lymphedema, not elsewhere classified: Secondary | ICD-10-CM | POA: Diagnosis not present

## 2020-05-10 DIAGNOSIS — Z87891 Personal history of nicotine dependence: Secondary | ICD-10-CM | POA: Diagnosis not present

## 2020-05-10 DIAGNOSIS — Z86718 Personal history of other venous thrombosis and embolism: Secondary | ICD-10-CM | POA: Diagnosis not present

## 2020-05-10 DIAGNOSIS — M109 Gout, unspecified: Secondary | ICD-10-CM | POA: Diagnosis not present

## 2020-05-10 DIAGNOSIS — Z96651 Presence of right artificial knee joint: Secondary | ICD-10-CM | POA: Diagnosis not present

## 2020-05-10 DIAGNOSIS — E1122 Type 2 diabetes mellitus with diabetic chronic kidney disease: Secondary | ICD-10-CM | POA: Diagnosis not present

## 2020-05-10 DIAGNOSIS — J309 Allergic rhinitis, unspecified: Secondary | ICD-10-CM | POA: Diagnosis not present

## 2020-05-10 DIAGNOSIS — D631 Anemia in chronic kidney disease: Secondary | ICD-10-CM | POA: Diagnosis not present

## 2020-05-10 DIAGNOSIS — G4733 Obstructive sleep apnea (adult) (pediatric): Secondary | ICD-10-CM | POA: Diagnosis not present

## 2020-05-10 DIAGNOSIS — Z471 Aftercare following joint replacement surgery: Secondary | ICD-10-CM | POA: Diagnosis not present

## 2020-05-10 DIAGNOSIS — I10 Essential (primary) hypertension: Secondary | ICD-10-CM | POA: Diagnosis not present

## 2020-05-10 DIAGNOSIS — J449 Chronic obstructive pulmonary disease, unspecified: Secondary | ICD-10-CM | POA: Diagnosis not present

## 2020-05-10 DIAGNOSIS — Z7901 Long term (current) use of anticoagulants: Secondary | ICD-10-CM | POA: Diagnosis not present

## 2020-05-10 DIAGNOSIS — N184 Chronic kidney disease, stage 4 (severe): Secondary | ICD-10-CM | POA: Diagnosis not present

## 2020-05-10 DIAGNOSIS — E1151 Type 2 diabetes mellitus with diabetic peripheral angiopathy without gangrene: Secondary | ICD-10-CM | POA: Diagnosis not present

## 2020-05-11 ENCOUNTER — Ambulatory Visit: Payer: Medicare Other | Admitting: Pharmacy Technician

## 2020-05-11 ENCOUNTER — Other Ambulatory Visit: Payer: Self-pay

## 2020-05-11 DIAGNOSIS — E1122 Type 2 diabetes mellitus with diabetic chronic kidney disease: Secondary | ICD-10-CM | POA: Diagnosis not present

## 2020-05-11 DIAGNOSIS — Z471 Aftercare following joint replacement surgery: Secondary | ICD-10-CM | POA: Diagnosis not present

## 2020-05-11 DIAGNOSIS — J309 Allergic rhinitis, unspecified: Secondary | ICD-10-CM | POA: Diagnosis not present

## 2020-05-11 DIAGNOSIS — E1151 Type 2 diabetes mellitus with diabetic peripheral angiopathy without gangrene: Secondary | ICD-10-CM | POA: Diagnosis not present

## 2020-05-11 DIAGNOSIS — Z7901 Long term (current) use of anticoagulants: Secondary | ICD-10-CM | POA: Diagnosis not present

## 2020-05-11 DIAGNOSIS — Z87891 Personal history of nicotine dependence: Secondary | ICD-10-CM | POA: Diagnosis not present

## 2020-05-11 DIAGNOSIS — I10 Essential (primary) hypertension: Secondary | ICD-10-CM | POA: Diagnosis not present

## 2020-05-11 DIAGNOSIS — Z86718 Personal history of other venous thrombosis and embolism: Secondary | ICD-10-CM | POA: Diagnosis not present

## 2020-05-11 DIAGNOSIS — I872 Venous insufficiency (chronic) (peripheral): Secondary | ICD-10-CM | POA: Diagnosis not present

## 2020-05-11 DIAGNOSIS — M109 Gout, unspecified: Secondary | ICD-10-CM | POA: Diagnosis not present

## 2020-05-11 DIAGNOSIS — D631 Anemia in chronic kidney disease: Secondary | ICD-10-CM | POA: Diagnosis not present

## 2020-05-11 DIAGNOSIS — I890 Lymphedema, not elsewhere classified: Secondary | ICD-10-CM | POA: Diagnosis not present

## 2020-05-11 DIAGNOSIS — G4733 Obstructive sleep apnea (adult) (pediatric): Secondary | ICD-10-CM | POA: Diagnosis not present

## 2020-05-11 DIAGNOSIS — N184 Chronic kidney disease, stage 4 (severe): Secondary | ICD-10-CM | POA: Diagnosis not present

## 2020-05-11 DIAGNOSIS — Z79899 Other long term (current) drug therapy: Secondary | ICD-10-CM

## 2020-05-11 DIAGNOSIS — J449 Chronic obstructive pulmonary disease, unspecified: Secondary | ICD-10-CM | POA: Diagnosis not present

## 2020-05-11 DIAGNOSIS — Z96651 Presence of right artificial knee joint: Secondary | ICD-10-CM | POA: Diagnosis not present

## 2020-05-11 NOTE — Progress Notes (Signed)
Patient in Medicare Part D coverage gap.  Provided patient with Xarelto to last until Part D plan begins again on 06/23/20.  Patient understands that he needs to use Part D plan beginning 06/23/20 to obtain this medication.  [facility]

## 2020-05-14 DIAGNOSIS — G4733 Obstructive sleep apnea (adult) (pediatric): Secondary | ICD-10-CM | POA: Diagnosis not present

## 2020-05-14 DIAGNOSIS — J449 Chronic obstructive pulmonary disease, unspecified: Secondary | ICD-10-CM | POA: Diagnosis not present

## 2020-05-14 DIAGNOSIS — M5030 Other cervical disc degeneration, unspecified cervical region: Secondary | ICD-10-CM | POA: Diagnosis not present

## 2020-05-14 DIAGNOSIS — M7580 Other shoulder lesions, unspecified shoulder: Secondary | ICD-10-CM | POA: Diagnosis not present

## 2020-05-14 DIAGNOSIS — E1151 Type 2 diabetes mellitus with diabetic peripheral angiopathy without gangrene: Secondary | ICD-10-CM | POA: Diagnosis not present

## 2020-05-14 DIAGNOSIS — E1122 Type 2 diabetes mellitus with diabetic chronic kidney disease: Secondary | ICD-10-CM | POA: Diagnosis not present

## 2020-05-14 DIAGNOSIS — I890 Lymphedema, not elsewhere classified: Secondary | ICD-10-CM | POA: Diagnosis not present

## 2020-05-14 DIAGNOSIS — Z471 Aftercare following joint replacement surgery: Secondary | ICD-10-CM | POA: Diagnosis not present

## 2020-05-14 DIAGNOSIS — Z96651 Presence of right artificial knee joint: Secondary | ICD-10-CM | POA: Diagnosis not present

## 2020-05-14 DIAGNOSIS — I10 Essential (primary) hypertension: Secondary | ICD-10-CM | POA: Diagnosis not present

## 2020-05-14 DIAGNOSIS — Z86718 Personal history of other venous thrombosis and embolism: Secondary | ICD-10-CM | POA: Diagnosis not present

## 2020-05-14 DIAGNOSIS — M5412 Radiculopathy, cervical region: Secondary | ICD-10-CM | POA: Diagnosis not present

## 2020-05-14 DIAGNOSIS — I872 Venous insufficiency (chronic) (peripheral): Secondary | ICD-10-CM | POA: Diagnosis not present

## 2020-05-14 DIAGNOSIS — J309 Allergic rhinitis, unspecified: Secondary | ICD-10-CM | POA: Diagnosis not present

## 2020-05-14 DIAGNOSIS — M7582 Other shoulder lesions, left shoulder: Secondary | ICD-10-CM | POA: Diagnosis not present

## 2020-05-14 DIAGNOSIS — Z7901 Long term (current) use of anticoagulants: Secondary | ICD-10-CM | POA: Diagnosis not present

## 2020-05-14 DIAGNOSIS — D631 Anemia in chronic kidney disease: Secondary | ICD-10-CM | POA: Diagnosis not present

## 2020-05-14 DIAGNOSIS — S86811D Strain of other muscle(s) and tendon(s) at lower leg level, right leg, subsequent encounter: Secondary | ICD-10-CM | POA: Diagnosis not present

## 2020-05-14 DIAGNOSIS — M109 Gout, unspecified: Secondary | ICD-10-CM | POA: Diagnosis not present

## 2020-05-14 DIAGNOSIS — N184 Chronic kidney disease, stage 4 (severe): Secondary | ICD-10-CM | POA: Diagnosis not present

## 2020-05-14 DIAGNOSIS — Z87891 Personal history of nicotine dependence: Secondary | ICD-10-CM | POA: Diagnosis not present

## 2020-05-14 DIAGNOSIS — M19122 Post-traumatic osteoarthritis, left elbow: Secondary | ICD-10-CM | POA: Diagnosis not present

## 2020-05-15 ENCOUNTER — Other Ambulatory Visit: Payer: Self-pay | Admitting: Neurology

## 2020-05-15 DIAGNOSIS — J309 Allergic rhinitis, unspecified: Secondary | ICD-10-CM | POA: Diagnosis not present

## 2020-05-15 DIAGNOSIS — Z471 Aftercare following joint replacement surgery: Secondary | ICD-10-CM | POA: Diagnosis not present

## 2020-05-15 DIAGNOSIS — M109 Gout, unspecified: Secondary | ICD-10-CM | POA: Diagnosis not present

## 2020-05-15 DIAGNOSIS — I10 Essential (primary) hypertension: Secondary | ICD-10-CM | POA: Diagnosis not present

## 2020-05-15 DIAGNOSIS — I872 Venous insufficiency (chronic) (peripheral): Secondary | ICD-10-CM | POA: Diagnosis not present

## 2020-05-15 DIAGNOSIS — Z87891 Personal history of nicotine dependence: Secondary | ICD-10-CM | POA: Diagnosis not present

## 2020-05-15 DIAGNOSIS — G4733 Obstructive sleep apnea (adult) (pediatric): Secondary | ICD-10-CM | POA: Diagnosis not present

## 2020-05-15 DIAGNOSIS — Z7901 Long term (current) use of anticoagulants: Secondary | ICD-10-CM | POA: Diagnosis not present

## 2020-05-15 DIAGNOSIS — N184 Chronic kidney disease, stage 4 (severe): Secondary | ICD-10-CM | POA: Diagnosis not present

## 2020-05-15 DIAGNOSIS — E1151 Type 2 diabetes mellitus with diabetic peripheral angiopathy without gangrene: Secondary | ICD-10-CM | POA: Diagnosis not present

## 2020-05-15 DIAGNOSIS — J449 Chronic obstructive pulmonary disease, unspecified: Secondary | ICD-10-CM | POA: Diagnosis not present

## 2020-05-15 DIAGNOSIS — Z96651 Presence of right artificial knee joint: Secondary | ICD-10-CM | POA: Diagnosis not present

## 2020-05-15 DIAGNOSIS — Z86718 Personal history of other venous thrombosis and embolism: Secondary | ICD-10-CM | POA: Diagnosis not present

## 2020-05-15 DIAGNOSIS — D631 Anemia in chronic kidney disease: Secondary | ICD-10-CM | POA: Diagnosis not present

## 2020-05-15 DIAGNOSIS — E1122 Type 2 diabetes mellitus with diabetic chronic kidney disease: Secondary | ICD-10-CM | POA: Diagnosis not present

## 2020-05-15 DIAGNOSIS — I890 Lymphedema, not elsewhere classified: Secondary | ICD-10-CM | POA: Diagnosis not present

## 2020-05-15 DIAGNOSIS — G20 Parkinson's disease: Secondary | ICD-10-CM

## 2020-05-16 DIAGNOSIS — M109 Gout, unspecified: Secondary | ICD-10-CM | POA: Diagnosis not present

## 2020-05-16 DIAGNOSIS — Z87891 Personal history of nicotine dependence: Secondary | ICD-10-CM | POA: Diagnosis not present

## 2020-05-16 DIAGNOSIS — I872 Venous insufficiency (chronic) (peripheral): Secondary | ICD-10-CM | POA: Diagnosis not present

## 2020-05-16 DIAGNOSIS — Z471 Aftercare following joint replacement surgery: Secondary | ICD-10-CM | POA: Diagnosis not present

## 2020-05-16 DIAGNOSIS — N184 Chronic kidney disease, stage 4 (severe): Secondary | ICD-10-CM | POA: Diagnosis not present

## 2020-05-16 DIAGNOSIS — J309 Allergic rhinitis, unspecified: Secondary | ICD-10-CM | POA: Diagnosis not present

## 2020-05-16 DIAGNOSIS — D631 Anemia in chronic kidney disease: Secondary | ICD-10-CM | POA: Diagnosis not present

## 2020-05-16 DIAGNOSIS — G4733 Obstructive sleep apnea (adult) (pediatric): Secondary | ICD-10-CM | POA: Diagnosis not present

## 2020-05-16 DIAGNOSIS — I890 Lymphedema, not elsewhere classified: Secondary | ICD-10-CM | POA: Diagnosis not present

## 2020-05-16 DIAGNOSIS — Z96651 Presence of right artificial knee joint: Secondary | ICD-10-CM | POA: Diagnosis not present

## 2020-05-16 DIAGNOSIS — E1122 Type 2 diabetes mellitus with diabetic chronic kidney disease: Secondary | ICD-10-CM | POA: Diagnosis not present

## 2020-05-16 DIAGNOSIS — Z7901 Long term (current) use of anticoagulants: Secondary | ICD-10-CM | POA: Diagnosis not present

## 2020-05-16 DIAGNOSIS — J449 Chronic obstructive pulmonary disease, unspecified: Secondary | ICD-10-CM | POA: Diagnosis not present

## 2020-05-16 DIAGNOSIS — I10 Essential (primary) hypertension: Secondary | ICD-10-CM | POA: Diagnosis not present

## 2020-05-16 DIAGNOSIS — E1151 Type 2 diabetes mellitus with diabetic peripheral angiopathy without gangrene: Secondary | ICD-10-CM | POA: Diagnosis not present

## 2020-05-16 DIAGNOSIS — Z86718 Personal history of other venous thrombosis and embolism: Secondary | ICD-10-CM | POA: Diagnosis not present

## 2020-05-18 DIAGNOSIS — S86811A Strain of other muscle(s) and tendon(s) at lower leg level, right leg, initial encounter: Secondary | ICD-10-CM | POA: Diagnosis not present

## 2020-05-18 DIAGNOSIS — Z96659 Presence of unspecified artificial knee joint: Secondary | ICD-10-CM | POA: Diagnosis not present

## 2020-05-20 DIAGNOSIS — E1122 Type 2 diabetes mellitus with diabetic chronic kidney disease: Secondary | ICD-10-CM | POA: Diagnosis not present

## 2020-05-20 DIAGNOSIS — Z96651 Presence of right artificial knee joint: Secondary | ICD-10-CM | POA: Diagnosis not present

## 2020-05-20 DIAGNOSIS — J309 Allergic rhinitis, unspecified: Secondary | ICD-10-CM | POA: Diagnosis not present

## 2020-05-20 DIAGNOSIS — Z86718 Personal history of other venous thrombosis and embolism: Secondary | ICD-10-CM | POA: Diagnosis not present

## 2020-05-20 DIAGNOSIS — Z471 Aftercare following joint replacement surgery: Secondary | ICD-10-CM | POA: Diagnosis not present

## 2020-05-20 DIAGNOSIS — M109 Gout, unspecified: Secondary | ICD-10-CM | POA: Diagnosis not present

## 2020-05-20 DIAGNOSIS — Z7901 Long term (current) use of anticoagulants: Secondary | ICD-10-CM | POA: Diagnosis not present

## 2020-05-20 DIAGNOSIS — E1151 Type 2 diabetes mellitus with diabetic peripheral angiopathy without gangrene: Secondary | ICD-10-CM | POA: Diagnosis not present

## 2020-05-20 DIAGNOSIS — D631 Anemia in chronic kidney disease: Secondary | ICD-10-CM | POA: Diagnosis not present

## 2020-05-20 DIAGNOSIS — N184 Chronic kidney disease, stage 4 (severe): Secondary | ICD-10-CM | POA: Diagnosis not present

## 2020-05-20 DIAGNOSIS — I10 Essential (primary) hypertension: Secondary | ICD-10-CM | POA: Diagnosis not present

## 2020-05-20 DIAGNOSIS — Z87891 Personal history of nicotine dependence: Secondary | ICD-10-CM | POA: Diagnosis not present

## 2020-05-20 DIAGNOSIS — I872 Venous insufficiency (chronic) (peripheral): Secondary | ICD-10-CM | POA: Diagnosis not present

## 2020-05-20 DIAGNOSIS — I890 Lymphedema, not elsewhere classified: Secondary | ICD-10-CM | POA: Diagnosis not present

## 2020-05-20 DIAGNOSIS — G4733 Obstructive sleep apnea (adult) (pediatric): Secondary | ICD-10-CM | POA: Diagnosis not present

## 2020-05-20 DIAGNOSIS — J449 Chronic obstructive pulmonary disease, unspecified: Secondary | ICD-10-CM | POA: Diagnosis not present

## 2020-05-21 ENCOUNTER — Other Ambulatory Visit: Payer: Self-pay

## 2020-05-21 ENCOUNTER — Encounter: Payer: Self-pay | Admitting: Family Medicine

## 2020-05-21 ENCOUNTER — Ambulatory Visit (INDEPENDENT_AMBULATORY_CARE_PROVIDER_SITE_OTHER): Payer: Medicare Other | Admitting: Family Medicine

## 2020-05-21 VITALS — BP 124/48 | HR 68 | Temp 97.7°F | Resp 16 | Ht 70.0 in | Wt 297.0 lb

## 2020-05-21 DIAGNOSIS — F0280 Dementia in other diseases classified elsewhere without behavioral disturbance: Secondary | ICD-10-CM

## 2020-05-21 DIAGNOSIS — I872 Venous insufficiency (chronic) (peripheral): Secondary | ICD-10-CM | POA: Diagnosis not present

## 2020-05-21 DIAGNOSIS — I825Y9 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: Secondary | ICD-10-CM | POA: Diagnosis not present

## 2020-05-21 DIAGNOSIS — E1151 Type 2 diabetes mellitus with diabetic peripheral angiopathy without gangrene: Secondary | ICD-10-CM | POA: Diagnosis not present

## 2020-05-21 DIAGNOSIS — J449 Chronic obstructive pulmonary disease, unspecified: Secondary | ICD-10-CM | POA: Diagnosis not present

## 2020-05-21 DIAGNOSIS — D631 Anemia in chronic kidney disease: Secondary | ICD-10-CM

## 2020-05-21 DIAGNOSIS — G4733 Obstructive sleep apnea (adult) (pediatric): Secondary | ICD-10-CM | POA: Diagnosis not present

## 2020-05-21 DIAGNOSIS — I890 Lymphedema, not elsewhere classified: Secondary | ICD-10-CM | POA: Diagnosis not present

## 2020-05-21 DIAGNOSIS — E1122 Type 2 diabetes mellitus with diabetic chronic kidney disease: Secondary | ICD-10-CM

## 2020-05-21 DIAGNOSIS — I10 Essential (primary) hypertension: Secondary | ICD-10-CM | POA: Diagnosis not present

## 2020-05-21 DIAGNOSIS — E1169 Type 2 diabetes mellitus with other specified complication: Secondary | ICD-10-CM | POA: Insufficient documentation

## 2020-05-21 DIAGNOSIS — M1A00X0 Idiopathic chronic gout, unspecified site, without tophus (tophi): Secondary | ICD-10-CM

## 2020-05-21 DIAGNOSIS — Z7901 Long term (current) use of anticoagulants: Secondary | ICD-10-CM | POA: Diagnosis not present

## 2020-05-21 DIAGNOSIS — I129 Hypertensive chronic kidney disease with stage 1 through stage 4 chronic kidney disease, or unspecified chronic kidney disease: Secondary | ICD-10-CM | POA: Diagnosis not present

## 2020-05-21 DIAGNOSIS — N184 Chronic kidney disease, stage 4 (severe): Secondary | ICD-10-CM | POA: Diagnosis not present

## 2020-05-21 DIAGNOSIS — Z471 Aftercare following joint replacement surgery: Secondary | ICD-10-CM | POA: Diagnosis not present

## 2020-05-21 DIAGNOSIS — Z96651 Presence of right artificial knee joint: Secondary | ICD-10-CM | POA: Diagnosis not present

## 2020-05-21 DIAGNOSIS — Z86718 Personal history of other venous thrombosis and embolism: Secondary | ICD-10-CM | POA: Diagnosis not present

## 2020-05-21 DIAGNOSIS — I739 Peripheral vascular disease, unspecified: Secondary | ICD-10-CM

## 2020-05-21 DIAGNOSIS — Z9989 Dependence on other enabling machines and devices: Secondary | ICD-10-CM

## 2020-05-21 DIAGNOSIS — G3183 Dementia with Lewy bodies: Secondary | ICD-10-CM

## 2020-05-21 DIAGNOSIS — Z6841 Body Mass Index (BMI) 40.0 and over, adult: Secondary | ICD-10-CM

## 2020-05-21 DIAGNOSIS — J309 Allergic rhinitis, unspecified: Secondary | ICD-10-CM | POA: Diagnosis not present

## 2020-05-21 DIAGNOSIS — E785 Hyperlipidemia, unspecified: Secondary | ICD-10-CM

## 2020-05-21 DIAGNOSIS — M109 Gout, unspecified: Secondary | ICD-10-CM | POA: Diagnosis not present

## 2020-05-21 DIAGNOSIS — G40909 Epilepsy, unspecified, not intractable, without status epilepticus: Secondary | ICD-10-CM

## 2020-05-21 DIAGNOSIS — Z87891 Personal history of nicotine dependence: Secondary | ICD-10-CM | POA: Diagnosis not present

## 2020-05-21 MED ORDER — ATORVASTATIN CALCIUM 20 MG PO TABS: 20.0000 mg | ORAL_TABLET | Freq: Every day | ORAL | 3 refills | Status: AC

## 2020-05-21 NOTE — Patient Instructions (Addendum)
Thank you for coming to the office today.  Ordered Cholesterol medicine Atorvastatin to [facility]. Restart this med.  Palliative Care referral to [facility] they will set this apt up.  Keep on other meds.   Please schedule a Follow-up Appointment to: Return in about 3 months (around 08/20/2020) for 3 month follow-up DM A1c, chronic conditions.  If you have any other questions or concerns, please feel free to call the office or send a message through [place]. You may also schedule an earlier appointment if necessary.  Additionally, you may be receiving a survey about your experience at our office within a few days to 1 week by e-mail or mail. We value your feedback.  Nobie Putnam, DO [facility], [facility]   DASH Eating Plan DASH stands for "Dietary Approaches to Stop Hypertension." The DASH eating plan is a healthy eating plan that has been shown to reduce high blood pressure (hypertension). It may also reduce your risk for type 2 diabetes, heart disease, and stroke. The DASH eating plan may also help with weight loss. What are tips for following this plan?  General guidelines  Avoid eating more than 2,300 mg (milligrams) of salt (sodium) a day. If you have hypertension, you may need to reduce your sodium intake to 1,500 mg a day.  Limit alcohol intake to no more than 1 drink a day for nonpregnant women and 2 drinks a day for men. One drink equals 12 oz of beer, 5 oz of wine, or 1 oz of hard liquor.  Work with your health care provider to maintain a healthy body weight or to lose weight. Ask what an ideal weight is for you.  Get at least 30 minutes of exercise that causes your heart to beat faster (aerobic exercise) most days of the week. Activities may include walking, swimming, or biking.  Work with your health care provider or diet and nutrition specialist (dietitian) to adjust your eating plan to your individual calorie needs. Reading food labels   Check food  labels for the amount of sodium per serving. Choose foods with less than 5 percent of the Daily Value of sodium. Generally, foods with less than 300 mg of sodium per serving fit into this eating plan.  To find whole grains, look for the word "whole" as the first word in the ingredient list. Shopping  Buy products labeled as "low-sodium" or "no salt added."  Buy fresh foods. Avoid canned foods and premade or frozen meals. Cooking  Avoid adding salt when cooking. Use salt-free seasonings or herbs instead of table salt or sea salt. Check with your health care provider or pharmacist before using salt substitutes.  Do not fry foods. Cook foods using healthy methods such as baking, boiling, grilling, and broiling instead.  Cook with heart-healthy oils, such as olive, canola, soybean, or sunflower oil. Meal planning  Eat a balanced diet that includes: ? 5 or more servings of fruits and vegetables each day. At each meal, try to fill half of your plate with fruits and vegetables. ? Up to 6-8 servings of whole grains each day. ? Less than 6 oz of lean meat, poultry, or fish each day. A 3-oz serving of meat is about the same size as a deck of cards. One egg equals 1 oz. ? 2 servings of low-fat dairy each day. ? A serving of nuts, seeds, or beans 5 times each week. ? Heart-healthy fats. Healthy fats called Omega-3 fatty acids are found in foods such as flaxseeds  and coldwater fish, like sardines, salmon, and mackerel.  Limit how much you eat of the following: ? Canned or prepackaged foods. ? Food that is high in trans fat, such as fried foods. ? Food that is high in saturated fat, such as fatty meat. ? Sweets, desserts, sugary drinks, and other foods with added sugar. ? Full-fat dairy products.  Do not salt foods before eating.  Try to eat at least 2 vegetarian meals each week.  Eat more home-cooked food and less restaurant, buffet, and fast food.  When eating at a restaurant, ask that your  food be prepared with less salt or no salt, if possible. What foods are recommended? The items listed may not be a complete list. Talk with your dietitian about what dietary choices are best for you. Grains Whole-grain or whole-wheat bread. Whole-grain or whole-wheat pasta. Brown rice. [place]. Bulgur. Whole-grain and low-sodium cereals. Pita bread. Low-fat, low-sodium crackers. Whole-wheat flour tortillas. Vegetables Fresh or frozen vegetables (raw, steamed, roasted, or grilled). Low-sodium or reduced-sodium tomato and vegetable juice. Low-sodium or reduced-sodium tomato sauce and tomato paste. Low-sodium or reduced-sodium canned vegetables. Fruits All fresh, dried, or frozen fruit. Canned fruit in natural juice (without added sugar). Meat and other protein foods Skinless chicken or [place]. Ground chicken or [place]. Pork with fat trimmed off. Fish and seafood. Egg whites. Dried beans, peas, or lentils. Unsalted nuts, nut butters, and seeds. Unsalted canned beans. Lean cuts of beef with fat trimmed off. Low-sodium, lean deli meat. Dairy Low-fat (1%) or fat-free (skim) milk. Fat-free, low-fat, or reduced-fat cheeses. Nonfat, low-sodium ricotta or cottage cheese. Low-fat or nonfat yogurt. Low-fat, low-sodium cheese. Fats and oils Soft margarine without trans fats. Vegetable oil. Low-fat, reduced-fat, or light mayonnaise and salad dressings (reduced-sodium). Canola, safflower, olive, soybean, and sunflower oils. Avocado. Seasoning and other foods Herbs. Spices. Seasoning mixes without salt. Unsalted popcorn and pretzels. Fat-free sweets. What foods are not recommended? The items listed may not be a complete list. Talk with your dietitian about what dietary choices are best for you. Grains Baked goods made with fat, such as croissants, muffins, or some breads. Dry pasta or rice meal packs. Vegetables Creamed or fried vegetables. Vegetables in a cheese sauce. Regular canned vegetables (not  low-sodium or reduced-sodium). Regular canned tomato sauce and paste (not low-sodium or reduced-sodium). Regular tomato and vegetable juice (not low-sodium or reduced-sodium). Angie Fava. Olives. Fruits Canned fruit in a light or heavy syrup. Fried fruit. Fruit in cream or butter sauce. Meat and other protein foods Fatty cuts of meat. Ribs. Fried meat. Berniece Salines. Sausage. Bologna and other processed lunch meats. Salami. Fatback. Hotdogs. Bratwurst. Salted nuts and seeds. Canned beans with added salt. Canned or smoked fish. Whole eggs or egg yolks. Chicken or [place] with skin. Dairy Whole or 2% milk, cream, and half-and-half. Whole or full-fat cream cheese. Whole-fat or sweetened yogurt. Full-fat cheese. Nondairy creamers. Whipped toppings. Processed cheese and cheese spreads. Fats and oils Butter. Stick margarine. Lard. Shortening. Ghee. Bacon fat. Tropical oils, such as coconut, palm kernel, or palm oil. Seasoning and other foods Salted popcorn and pretzels. Onion salt, garlic salt, seasoned salt, table salt, and sea salt. Worcestershire sauce. Tartar sauce. Barbecue sauce. Teriyaki sauce. Soy sauce, including reduced-sodium. Steak sauce. Canned and packaged gravies. Fish sauce. Oyster sauce. Cocktail sauce. Horseradish that you find on the shelf. Ketchup. Mustard. Meat flavorings and tenderizers. Bouillon cubes. Hot sauce and Tabasco sauce. Premade or packaged marinades. Premade or packaged taco seasonings. Relishes. Regular salad dressings. Where to  find more information:  National Heart, Lung, and Blood Institute: https://wilson-eaton.com/  American Heart Association: www.heart.org Summary  The DASH eating plan is a healthy eating plan that has been shown to reduce high blood pressure (hypertension). It may also reduce your risk for type 2 diabetes, heart disease, and stroke.  With the DASH eating plan, you should limit salt (sodium) intake to 2,300 mg a day. If you have hypertension, you may need to reduce  your sodium intake to 1,500 mg a day.  When on the DASH eating plan, aim to eat more fresh fruits and vegetables, whole grains, lean proteins, low-fat dairy, and heart-healthy fats.  Work with your health care provider or diet and nutrition specialist (dietitian) to adjust your eating plan to your individual calorie needs. This information is not intended to replace advice given to you by your health care provider. Make sure you discuss any questions you have with your health care provider. Document Revised: 05/22/2017 Document Reviewed: 06/02/2016 Elsevier Patient Education  2020 [employer].

## 2020-05-21 NOTE — Progress Notes (Signed)
Subjective:    Patient ID: Justin Shannon., male    DOB: 1941/04/07, 79 y.o.   MRN: [id number]  Justin Shannon. is a 79 y.o. male presenting on 05/21/2020 for Establish Care and Hallucinations (dementia and Parkinson's disease as per neuro --patient had HA pain on Right side but left side is going numb now as per daughter couple of weeks )  Here to establish care, transfer from [facility] - Dr Ouida Sills previously Accompanied by wife and daughter, Judie Petit who provides additional history.  HPI   Neurology - Dr Jennings [facility]) [place] NP Nephrology - Dr Murlean [facility]) Orthopedics - Dr Skip Estimable [facility]) Podiatry - Dr Gardiner Barefoot ([facility]) [facility]rology - [facility] ([facility]) Vascular - Eulogio Ditch NP ([facility])  Hallucinations R sided / Frontal Headaches Left sided weakness Worsening in past few months and weakness. Now has upcoming CT Scan. History of prior seizure 2015 - he has been on seizure medication, Keppra 522m BID  Home visit with OT yesterday. Considering referral from Palliative Care. He has had nurse visits from [facility] in past and also RN from [facility] at times.  DVT / PE, Chronic He is followed by Vascular He remains on indefinite lifelong anticoagulation, >10 years ago approx 2008 or 2009 RIGHT lower ext DVT, with chronic residual changes and now venous insufficiency and swelling, history of stasis ulcer. He continues Xarelto 165mdaily for prevention.  CHRONIC HTN CKD IV Reports has had adjusted BP medications in recent history. Off Diltiazem. Now back to full dose Metoprolol. Off Olmesartan, HCTZ Current Meds - Metoprolol XL 5023maily   Reports good compliance, took meds today. Tolerating well, w/o complaints. Denies CP, dyspnea, HA, edema, dizziness / lightheadedness  CHRONIC DM, Type 2: Reports no concerns. Previously preDm, then later dx with DM not on treatment. Meds: none Currently  Not on ACEi / ARB Limited mobility Denies hypoglycemia, polyuria, visual changes, numbness or tingling.  Gout, chronic Usually has history of gout flares in past, in big toe Now controlled on Allopurinol 100m49mily.  History of COVID He has Albuterol and Advair, using both currently. No prior dx.    Depression screen PHQ [facility] 05/21/2020 05/27/2019 06/03/2017  Decreased Interest 0 0 0  Down, Depressed, Hopeless 0 0 0  PHQ - 2 Score 0 0 0    Past Medical History:  Diagnosis Date  . Arthritis   . Bilateral swelling of feet   . BPH (benign prostatic hyperplasia)   . Chronic kidney disease   . DVT (deep venous thrombosis) (HCC)    right lower extremity  . History of gout   . Seizures (HCC)[place]. Sleep apnea   . Tendonitis of foot    left   Past Surgical History:  Procedure Laterality Date  . APPENDECTOMY    . APPLICATION OF WOUND VAC Right 11/02/2019   Procedure: APPLICATION OF WOUND VAC;  Surgeon: HootDereck Leep;  Location: [facility];  Service: Orthopedics;  Laterality: Right;  [id number]  . HERNIA REPAIR     UMBILICAL  . IVC FILTER INSERTION N/A 10/25/2019   Procedure: IVC FILTER INSERTION;  Surgeon: SchnKatha Cabal;  [facility];  Service: Cardiovascular;  Laterality: N/A;  . KNEE ARTHROPLASTY Right 11/02/2019   Procedure: COMPUTER ASSISTED TOTAL KNEE ARTHROPLASTY;  Surgeon: HootDereck Leep;  Location: [facility] ORS;  Service: Orthopedics;  Laterality: Right;  . PATELLAR TENDON REPAIR Right 12/05/2019  Procedure: PATELLA TENDON REPAIR;  Surgeon: Dereck Leep, MD;  Location: [facility] ORS;  Service: Orthopedics;  Laterality: Right;  . PROSTATE SURGERY     Social History   Socioeconomic History  . Marital status: Married    Spouse name: Not on file  . Number of children: Not on file  . Years of education: Not on file  . Highest education level: Not on file  Occupational History  . Not on file  Tobacco Use  . Smoking status: Former Smoker     Packs/day: 1.00    Years: 2.00    Pack years: 2.00    Types: Cigarettes    Quit date: 06/23/1968    Years since quitting: 51.9  . Smokeless tobacco: Never Used  . Tobacco comment: 25 years ago quit  Vaping Use  . Vaping Use: Never used  Substance and Sexual Activity  . Alcohol use: No  . Drug use: No  . Sexual activity: Not on file  Other Topics Concern  . Not on file  Social History Narrative  . Not on file   Social Determinants of Health   Financial Resource Strain:   . Difficulty of Paying Living Expenses: Not on file  Food Insecurity:   . Worried About Charity fundraiser in the Last Year: Not on file  . Ran Out of Food in the Last Year: Not on file  Transportation Needs:   . Lack of Transportation (Medical): Not on file  . Lack of Transportation (Non-Medical): Not on file  Physical Activity:   . Days of Exercise per Week: Not on file  . Minutes of Exercise per Session: Not on file  Stress:   . Feeling of Stress : Not on file  Social Connections:   . Frequency of Communication with Friends and Family: Not on file  . Frequency of Social Gatherings with Friends and Family: Not on file  . Attends Religious Services: Not on file  . Active Member of Clubs or Organizations: Not on file  . Attends Archivist Meetings: Not on file  . Marital Status: Not on file  Intimate Partner Violence:   . Fear of Current or Ex-Partner: Not on file  . Emotionally Abused: Not on file  . Physically Abused: Not on file  . Sexually Abused: Not on file   Family History  Problem Relation Age of Onset  . Arthritis Mother   . Hypertension Mother   . Diabetes Brother    Current Outpatient Medications on File Prior to Visit  Medication Sig  . Accu-Chek FastClix Lancets MISC USE TO CHECK GLUCOSE TWICE DAILY  . ACCU-CHEK GUIDE test strip USE STRIP TO CHECK GLUCOSE TWICE DAILY  . albuterol (VENTOLIN HFA) 108 (90 Base) MCG/ACT inhaler Inhale 2 puffs into the lungs every 4 (four)  hours as needed for wheezing.  Marland Kitchen allopurinol (ZYLOPRIM) 100 MG tablet Take 100 mg by mouth daily.  . Blood Glucose Monitoring Suppl (ACCU-CHEK GUIDE ME) w/Device KIT 2 (two) times daily.  Marland Kitchen docusate sodium (COLACE) 100 MG capsule Take 100 mg by mouth daily as needed for mild constipation or moderate constipation.   . fexofenadine (ALLEGRA) 180 MG tablet Take 180 mg by mouth daily as needed for allergies.   . fluticasone (FLONASE) 50 MCG/ACT nasal spray Place 2 sprays into both nostrils daily as needed for allergies.   . Fluticasone-Salmeterol (ADVAIR) 250-50 MCG/DOSE AEPB Inhale 1 puff into the lungs 2 (two) times daily as needed (Wheezing).  Marland Kitchen levETIRAcetam (KEPPRA)  500 MG tablet Take 500 mg by mouth 2 (two) times daily.   . Magnesium Citrate 100 MG TABS Take 100 mg by mouth daily.  . melatonin 5 MG TABS Take 5 mg by mouth.  . metoprolol succinate (TOPROL-XL) 50 MG 24 hr tablet Take 50 mg by mouth daily.   . Multiple Vitamin (ONE-A-DAY MENS PO) Take 1 tablet by mouth daily.   Marland Kitchen triamcinolone cream (KENALOG) 0.1 % APPLY TOPICALLY TWICE DAILY AS NEEDED  . XARELTO 10 MG TABS tablet Take 10 mg by mouth daily.  Marland Kitchen torsemide (DEMADEX) 20 MG tablet Take 20 mg by mouth daily. (Patient not taking: Reported on 05/21/2020)  . vitamin C (ASCORBIC ACID) 500 MG tablet Take 500 mg by mouth daily.  (Patient not taking: Reported on 05/21/2020)   No current facility-administered medications on file prior to visit.    Review of Systems Per HPI unless specifically indicated above       Objective:    BP (!) 124/48   Pulse 68   Temp 97.7 F (36.5 C) (Temporal)   Resp 16   Ht 5' 10"  (1.778 m)   Wt 297 lb (134.7 kg)   SpO2 94%   BMI 42.62 kg/m   Wt Readings from Last 3 Encounters:  05/21/20 297 lb (134.7 kg)  05/02/20 260 lb (117.9 kg)  03/26/20 268 lb (121.6 kg)    Physical Exam Vitals and nursing note reviewed.  Constitutional:      General: He is not in acute distress.    Appearance: He is  well-developed. He is obese. He is not diaphoretic.     Comments: Well-appearing, comfortable, cooperative  HENT:     Head: Normocephalic and atraumatic.  Eyes:     General:        Right eye: No discharge.        Left eye: No discharge.     Conjunctiva/sclera: Conjunctivae normal.  Cardiovascular:     Rate and Rhythm: Normal rate.  Pulmonary:     Effort: Pulmonary effort is normal.  Musculoskeletal:     Right lower leg: Edema (+2 pitting chronic venous stasis changes, chronic ulceration inner) present.     Left lower leg: No edema.     Comments: Wheelchair bound  Skin:    General: Skin is warm and dry.     Findings: No erythema or rash.  Neurological:     Mental Status: He is alert and oriented to person, place, and time.  Psychiatric:        Behavior: Behavior normal.     Comments: Well groomed, good eye contact, normal speech and thoughts      Diabetic Foot Exam - Simple   Simple Foot Form Diabetic Foot exam was performed with the following findings: Yes 05/21/2020 11:26 AM  Visual Inspection See comments: Yes Sensation Testing Intact to touch and monofilament testing bilaterally: Yes Pulse Check Posterior Tibialis and Dorsalis pulse intact bilaterally: Yes Comments Bilateral feet with some dry skin and callus formation mild. Right leg ankle has chronic edema from history DVT.      Results for orders placed or performed in visit on 05/21/20  Hemoglobin A1c  Result Value Ref Range   Hemoglobin A1C 5.5       Assessment & Plan:   Problem List Items Addressed This Visit    Type 2 diabetes mellitus with stage 4 chronic kidney disease, without long-term current use of insulin (HCC)   Relevant Medications   atorvastatin (LIPITOR) 20 MG  tablet   Other Relevant Orders   Amb Referral to Palliative Care   PAD (peripheral artery disease) (HCC)   Relevant Medications   XARELTO 10 MG TABS tablet   atorvastatin (LIPITOR) 20 MG tablet   OSA on CPAP   Morbid obesity  with body mass index of 40.0-44.9 in adult ([place])   Lewy body dementia ([place]) - Primary   Relevant Orders   Amb Referral to Palliative Care   Hyperlipidemia associated with type 2 diabetes mellitus (HCC)   Relevant Medications   atorvastatin (LIPITOR) 20 MG tablet   Gout   Epilepsy ([place])   CKD (chronic kidney disease), stage IV (HCC)   Chronic venous insufficiency   Relevant Medications   XARELTO 10 MG TABS tablet   atorvastatin (LIPITOR) 20 MG tablet   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)   Relevant Medications   XARELTO 10 MG TABS tablet   atorvastatin (LIPITOR) 20 MG tablet   Other Relevant Orders   Amb Referral to Palliative Care   Benign hypertension with CKD (chronic kidney disease) stage IV (HCC)   Relevant Medications   XARELTO 10 MG TABS tablet   atorvastatin (LIPITOR) 20 MG tablet   Other Relevant Orders   Amb Referral to Palliative Care   Anemia due to stage 4 chronic kidney disease (HCC)    Other Visit Diagnoses    Peripheral vascular disease ([place])       Relevant Medications   XARELTO 10 MG TABS tablet   atorvastatin (LIPITOR) 20 MG tablet      #Type 2 DM, diet controlled A1c last 5.5 Not on medicine Restart Statin therapy for diabetic patient Foot exam today Future monitoring/surveillance  #HLD, in DM Restart Statin therapy, Atorvastatin 37m, tolerated well, no particular reason that it was stopped.  #CKD 4, Anemia Followed by Nephrology  #History of DVT, on Xarelto 176mdaily prophylaxis.  #Gout Controlled on Allopurinol 10010maily  #OSA on CPAP  #Lewy Body Dementia, parkinson features Generalized Weakness Limited to wheelchair and requires 2 person assist with gait belt right now Has HH [place] nursing at times. Recent Head Ct scheduled and f/u with neurology  #Referral to Palliative Care - [facility] for home evaluation symptom management and goals of care.  Meds ordered this encounter  Medications  .  atorvastatin (LIPITOR) 20 MG tablet    Sig: Take 1 tablet (20 mg total) by mouth at bedtime.    Dispense:  90 tablet    Refill:  3      Follow up plan: Return in about 3 months (around 08/20/2020) for 3 month follow-up DM A1c, chronic conditions.  AleNobie PutnamO [facility] 05/21/2020, 10:30 AM

## 2020-05-22 DIAGNOSIS — Z7901 Long term (current) use of anticoagulants: Secondary | ICD-10-CM | POA: Diagnosis not present

## 2020-05-22 DIAGNOSIS — E1122 Type 2 diabetes mellitus with diabetic chronic kidney disease: Secondary | ICD-10-CM | POA: Diagnosis not present

## 2020-05-22 DIAGNOSIS — J309 Allergic rhinitis, unspecified: Secondary | ICD-10-CM | POA: Diagnosis not present

## 2020-05-22 DIAGNOSIS — G4733 Obstructive sleep apnea (adult) (pediatric): Secondary | ICD-10-CM | POA: Diagnosis not present

## 2020-05-22 DIAGNOSIS — E1151 Type 2 diabetes mellitus with diabetic peripheral angiopathy without gangrene: Secondary | ICD-10-CM | POA: Diagnosis not present

## 2020-05-22 DIAGNOSIS — Z471 Aftercare following joint replacement surgery: Secondary | ICD-10-CM | POA: Diagnosis not present

## 2020-05-22 DIAGNOSIS — J449 Chronic obstructive pulmonary disease, unspecified: Secondary | ICD-10-CM | POA: Diagnosis not present

## 2020-05-22 DIAGNOSIS — Z87891 Personal history of nicotine dependence: Secondary | ICD-10-CM | POA: Diagnosis not present

## 2020-05-22 DIAGNOSIS — I872 Venous insufficiency (chronic) (peripheral): Secondary | ICD-10-CM | POA: Diagnosis not present

## 2020-05-22 DIAGNOSIS — D631 Anemia in chronic kidney disease: Secondary | ICD-10-CM | POA: Diagnosis not present

## 2020-05-22 DIAGNOSIS — Z86718 Personal history of other venous thrombosis and embolism: Secondary | ICD-10-CM | POA: Diagnosis not present

## 2020-05-22 DIAGNOSIS — Z96651 Presence of right artificial knee joint: Secondary | ICD-10-CM | POA: Diagnosis not present

## 2020-05-22 DIAGNOSIS — N184 Chronic kidney disease, stage 4 (severe): Secondary | ICD-10-CM | POA: Diagnosis not present

## 2020-05-22 DIAGNOSIS — M109 Gout, unspecified: Secondary | ICD-10-CM | POA: Diagnosis not present

## 2020-05-22 DIAGNOSIS — I10 Essential (primary) hypertension: Secondary | ICD-10-CM | POA: Diagnosis not present

## 2020-05-22 DIAGNOSIS — I890 Lymphedema, not elsewhere classified: Secondary | ICD-10-CM | POA: Diagnosis not present

## 2020-05-23 ENCOUNTER — Ambulatory Visit
Admission: RE | Admit: 2020-05-23 | Discharge: 2020-05-23 | Disposition: A | Discharge status: Home / Self Care | Payer: Medicare Other | Source: Ambulatory Visit | Attending: Neurology | Admitting: Neurology

## 2020-05-23 ENCOUNTER — Other Ambulatory Visit: Payer: Self-pay

## 2020-05-23 DIAGNOSIS — I10 Essential (primary) hypertension: Secondary | ICD-10-CM | POA: Insufficient documentation

## 2020-05-23 DIAGNOSIS — G20 Parkinson's disease: Secondary | ICD-10-CM | POA: Insufficient documentation

## 2020-05-23 DIAGNOSIS — I6782 Cerebral ischemia: Secondary | ICD-10-CM | POA: Diagnosis not present

## 2020-05-23 DIAGNOSIS — G9389 Other specified disorders of brain: Secondary | ICD-10-CM | POA: Diagnosis not present

## 2020-05-23 DIAGNOSIS — I6389 Other cerebral infarction: Secondary | ICD-10-CM | POA: Diagnosis not present

## 2020-05-24 DIAGNOSIS — J309 Allergic rhinitis, unspecified: Secondary | ICD-10-CM | POA: Diagnosis not present

## 2020-05-24 DIAGNOSIS — M1712 Unilateral primary osteoarthritis, left knee: Secondary | ICD-10-CM | POA: Diagnosis not present

## 2020-05-24 DIAGNOSIS — G4733 Obstructive sleep apnea (adult) (pediatric): Secondary | ICD-10-CM | POA: Diagnosis not present

## 2020-05-24 DIAGNOSIS — Z96651 Presence of right artificial knee joint: Secondary | ICD-10-CM | POA: Diagnosis not present

## 2020-05-24 DIAGNOSIS — E1151 Type 2 diabetes mellitus with diabetic peripheral angiopathy without gangrene: Secondary | ICD-10-CM | POA: Diagnosis not present

## 2020-05-24 DIAGNOSIS — I10 Essential (primary) hypertension: Secondary | ICD-10-CM | POA: Diagnosis not present

## 2020-05-24 DIAGNOSIS — M109 Gout, unspecified: Secondary | ICD-10-CM | POA: Diagnosis not present

## 2020-05-24 DIAGNOSIS — Z7901 Long term (current) use of anticoagulants: Secondary | ICD-10-CM | POA: Diagnosis not present

## 2020-05-24 DIAGNOSIS — N184 Chronic kidney disease, stage 4 (severe): Secondary | ICD-10-CM | POA: Diagnosis not present

## 2020-05-24 DIAGNOSIS — M4802 Spinal stenosis, cervical region: Secondary | ICD-10-CM | POA: Diagnosis not present

## 2020-05-24 DIAGNOSIS — Z86718 Personal history of other venous thrombosis and embolism: Secondary | ICD-10-CM | POA: Diagnosis not present

## 2020-05-24 DIAGNOSIS — I129 Hypertensive chronic kidney disease with stage 1 through stage 4 chronic kidney disease, or unspecified chronic kidney disease: Secondary | ICD-10-CM | POA: Diagnosis not present

## 2020-05-24 DIAGNOSIS — E1142 Type 2 diabetes mellitus with diabetic polyneuropathy: Secondary | ICD-10-CM | POA: Diagnosis not present

## 2020-05-24 DIAGNOSIS — Z87891 Personal history of nicotine dependence: Secondary | ICD-10-CM | POA: Diagnosis not present

## 2020-05-24 DIAGNOSIS — N189 Chronic kidney disease, unspecified: Secondary | ICD-10-CM | POA: Diagnosis not present

## 2020-05-24 DIAGNOSIS — E1122 Type 2 diabetes mellitus with diabetic chronic kidney disease: Secondary | ICD-10-CM | POA: Diagnosis not present

## 2020-05-24 DIAGNOSIS — J449 Chronic obstructive pulmonary disease, unspecified: Secondary | ICD-10-CM | POA: Diagnosis not present

## 2020-05-24 DIAGNOSIS — D631 Anemia in chronic kidney disease: Secondary | ICD-10-CM | POA: Diagnosis not present

## 2020-05-24 DIAGNOSIS — I872 Venous insufficiency (chronic) (peripheral): Secondary | ICD-10-CM | POA: Diagnosis not present

## 2020-05-24 DIAGNOSIS — I890 Lymphedema, not elsewhere classified: Secondary | ICD-10-CM | POA: Diagnosis not present

## 2020-05-25 ENCOUNTER — Telehealth: Payer: Self-pay | Admitting: Primary Care

## 2020-05-25 NOTE — Telephone Encounter (Signed)
Spoke with patient's daughter, [name] regarding Palliative referral/services and all questions were answered and she was in agreement with scheduling visit.  I have scheduled an In-person Consult for 06/04/20 @ 1:30 PM.

## 2020-05-28 DIAGNOSIS — M4802 Spinal stenosis, cervical region: Secondary | ICD-10-CM | POA: Diagnosis not present

## 2020-05-28 DIAGNOSIS — I872 Venous insufficiency (chronic) (peripheral): Secondary | ICD-10-CM | POA: Diagnosis not present

## 2020-05-28 DIAGNOSIS — J449 Chronic obstructive pulmonary disease, unspecified: Secondary | ICD-10-CM | POA: Diagnosis not present

## 2020-05-28 DIAGNOSIS — I890 Lymphedema, not elsewhere classified: Secondary | ICD-10-CM | POA: Diagnosis not present

## 2020-05-28 DIAGNOSIS — E790 Hyperuricemia without signs of inflammatory arthritis and tophaceous disease: Secondary | ICD-10-CM | POA: Diagnosis not present

## 2020-05-28 DIAGNOSIS — D631 Anemia in chronic kidney disease: Secondary | ICD-10-CM | POA: Diagnosis not present

## 2020-05-28 DIAGNOSIS — J309 Allergic rhinitis, unspecified: Secondary | ICD-10-CM | POA: Diagnosis not present

## 2020-05-28 DIAGNOSIS — M109 Gout, unspecified: Secondary | ICD-10-CM | POA: Diagnosis not present

## 2020-05-28 DIAGNOSIS — G4733 Obstructive sleep apnea (adult) (pediatric): Secondary | ICD-10-CM | POA: Diagnosis not present

## 2020-05-28 DIAGNOSIS — N184 Chronic kidney disease, stage 4 (severe): Secondary | ICD-10-CM | POA: Diagnosis not present

## 2020-05-28 DIAGNOSIS — Z87891 Personal history of nicotine dependence: Secondary | ICD-10-CM | POA: Diagnosis not present

## 2020-05-28 DIAGNOSIS — N1832 Chronic kidney disease, stage 3b: Secondary | ICD-10-CM | POA: Diagnosis not present

## 2020-05-28 DIAGNOSIS — Z86718 Personal history of other venous thrombosis and embolism: Secondary | ICD-10-CM | POA: Diagnosis not present

## 2020-05-28 DIAGNOSIS — E1122 Type 2 diabetes mellitus with diabetic chronic kidney disease: Secondary | ICD-10-CM | POA: Diagnosis not present

## 2020-05-28 DIAGNOSIS — I129 Hypertensive chronic kidney disease with stage 1 through stage 4 chronic kidney disease, or unspecified chronic kidney disease: Secondary | ICD-10-CM | POA: Diagnosis not present

## 2020-05-28 DIAGNOSIS — I10 Essential (primary) hypertension: Secondary | ICD-10-CM | POA: Diagnosis not present

## 2020-05-28 DIAGNOSIS — Z7901 Long term (current) use of anticoagulants: Secondary | ICD-10-CM | POA: Diagnosis not present

## 2020-05-28 DIAGNOSIS — R600 Localized edema: Secondary | ICD-10-CM | POA: Diagnosis not present

## 2020-05-28 DIAGNOSIS — E1151 Type 2 diabetes mellitus with diabetic peripheral angiopathy without gangrene: Secondary | ICD-10-CM | POA: Diagnosis not present

## 2020-05-28 DIAGNOSIS — Z96651 Presence of right artificial knee joint: Secondary | ICD-10-CM | POA: Diagnosis not present

## 2020-05-28 DIAGNOSIS — M1712 Unilateral primary osteoarthritis, left knee: Secondary | ICD-10-CM | POA: Diagnosis not present

## 2020-05-28 DIAGNOSIS — E1142 Type 2 diabetes mellitus with diabetic polyneuropathy: Secondary | ICD-10-CM | POA: Diagnosis not present

## 2020-05-29 DIAGNOSIS — M109 Gout, unspecified: Secondary | ICD-10-CM | POA: Diagnosis not present

## 2020-05-29 DIAGNOSIS — J449 Chronic obstructive pulmonary disease, unspecified: Secondary | ICD-10-CM | POA: Diagnosis not present

## 2020-05-29 DIAGNOSIS — Z96651 Presence of right artificial knee joint: Secondary | ICD-10-CM | POA: Diagnosis not present

## 2020-05-29 DIAGNOSIS — E1142 Type 2 diabetes mellitus with diabetic polyneuropathy: Secondary | ICD-10-CM | POA: Diagnosis not present

## 2020-05-29 DIAGNOSIS — I872 Venous insufficiency (chronic) (peripheral): Secondary | ICD-10-CM | POA: Diagnosis not present

## 2020-05-29 DIAGNOSIS — Z86718 Personal history of other venous thrombosis and embolism: Secondary | ICD-10-CM | POA: Diagnosis not present

## 2020-05-29 DIAGNOSIS — M4802 Spinal stenosis, cervical region: Secondary | ICD-10-CM | POA: Diagnosis not present

## 2020-05-29 DIAGNOSIS — M1712 Unilateral primary osteoarthritis, left knee: Secondary | ICD-10-CM | POA: Diagnosis not present

## 2020-05-29 DIAGNOSIS — I129 Hypertensive chronic kidney disease with stage 1 through stage 4 chronic kidney disease, or unspecified chronic kidney disease: Secondary | ICD-10-CM | POA: Diagnosis not present

## 2020-05-29 DIAGNOSIS — Z7901 Long term (current) use of anticoagulants: Secondary | ICD-10-CM | POA: Diagnosis not present

## 2020-05-29 DIAGNOSIS — N184 Chronic kidney disease, stage 4 (severe): Secondary | ICD-10-CM | POA: Diagnosis not present

## 2020-05-29 DIAGNOSIS — Z87891 Personal history of nicotine dependence: Secondary | ICD-10-CM | POA: Diagnosis not present

## 2020-05-29 DIAGNOSIS — G4733 Obstructive sleep apnea (adult) (pediatric): Secondary | ICD-10-CM | POA: Diagnosis not present

## 2020-05-29 DIAGNOSIS — E1151 Type 2 diabetes mellitus with diabetic peripheral angiopathy without gangrene: Secondary | ICD-10-CM | POA: Diagnosis not present

## 2020-05-29 DIAGNOSIS — J309 Allergic rhinitis, unspecified: Secondary | ICD-10-CM | POA: Diagnosis not present

## 2020-05-29 DIAGNOSIS — E1122 Type 2 diabetes mellitus with diabetic chronic kidney disease: Secondary | ICD-10-CM | POA: Diagnosis not present

## 2020-05-29 DIAGNOSIS — D631 Anemia in chronic kidney disease: Secondary | ICD-10-CM | POA: Diagnosis not present

## 2020-05-29 DIAGNOSIS — I890 Lymphedema, not elsewhere classified: Secondary | ICD-10-CM | POA: Diagnosis not present

## 2020-05-30 DIAGNOSIS — M109 Gout, unspecified: Secondary | ICD-10-CM | POA: Diagnosis not present

## 2020-05-30 DIAGNOSIS — E1142 Type 2 diabetes mellitus with diabetic polyneuropathy: Secondary | ICD-10-CM | POA: Diagnosis not present

## 2020-05-30 DIAGNOSIS — I129 Hypertensive chronic kidney disease with stage 1 through stage 4 chronic kidney disease, or unspecified chronic kidney disease: Secondary | ICD-10-CM | POA: Diagnosis not present

## 2020-05-30 DIAGNOSIS — N184 Chronic kidney disease, stage 4 (severe): Secondary | ICD-10-CM | POA: Diagnosis not present

## 2020-05-30 DIAGNOSIS — J449 Chronic obstructive pulmonary disease, unspecified: Secondary | ICD-10-CM | POA: Diagnosis not present

## 2020-05-30 DIAGNOSIS — I872 Venous insufficiency (chronic) (peripheral): Secondary | ICD-10-CM | POA: Diagnosis not present

## 2020-05-30 DIAGNOSIS — M1712 Unilateral primary osteoarthritis, left knee: Secondary | ICD-10-CM | POA: Diagnosis not present

## 2020-05-30 DIAGNOSIS — Z7901 Long term (current) use of anticoagulants: Secondary | ICD-10-CM | POA: Diagnosis not present

## 2020-05-30 DIAGNOSIS — E1122 Type 2 diabetes mellitus with diabetic chronic kidney disease: Secondary | ICD-10-CM | POA: Diagnosis not present

## 2020-05-30 DIAGNOSIS — G4733 Obstructive sleep apnea (adult) (pediatric): Secondary | ICD-10-CM | POA: Diagnosis not present

## 2020-05-30 DIAGNOSIS — D631 Anemia in chronic kidney disease: Secondary | ICD-10-CM | POA: Diagnosis not present

## 2020-05-30 DIAGNOSIS — Z86718 Personal history of other venous thrombosis and embolism: Secondary | ICD-10-CM | POA: Diagnosis not present

## 2020-05-30 DIAGNOSIS — Z96651 Presence of right artificial knee joint: Secondary | ICD-10-CM | POA: Diagnosis not present

## 2020-05-30 DIAGNOSIS — M4802 Spinal stenosis, cervical region: Secondary | ICD-10-CM | POA: Diagnosis not present

## 2020-05-30 DIAGNOSIS — E1151 Type 2 diabetes mellitus with diabetic peripheral angiopathy without gangrene: Secondary | ICD-10-CM | POA: Diagnosis not present

## 2020-05-30 DIAGNOSIS — Z87891 Personal history of nicotine dependence: Secondary | ICD-10-CM | POA: Diagnosis not present

## 2020-05-30 DIAGNOSIS — I890 Lymphedema, not elsewhere classified: Secondary | ICD-10-CM | POA: Diagnosis not present

## 2020-05-30 DIAGNOSIS — J309 Allergic rhinitis, unspecified: Secondary | ICD-10-CM | POA: Diagnosis not present

## 2020-05-31 DIAGNOSIS — G4733 Obstructive sleep apnea (adult) (pediatric): Secondary | ICD-10-CM | POA: Diagnosis not present

## 2020-05-31 DIAGNOSIS — I872 Venous insufficiency (chronic) (peripheral): Secondary | ICD-10-CM | POA: Diagnosis not present

## 2020-05-31 DIAGNOSIS — I890 Lymphedema, not elsewhere classified: Secondary | ICD-10-CM | POA: Diagnosis not present

## 2020-05-31 DIAGNOSIS — N184 Chronic kidney disease, stage 4 (severe): Secondary | ICD-10-CM | POA: Diagnosis not present

## 2020-05-31 DIAGNOSIS — E1151 Type 2 diabetes mellitus with diabetic peripheral angiopathy without gangrene: Secondary | ICD-10-CM | POA: Diagnosis not present

## 2020-05-31 DIAGNOSIS — E1122 Type 2 diabetes mellitus with diabetic chronic kidney disease: Secondary | ICD-10-CM | POA: Diagnosis not present

## 2020-05-31 DIAGNOSIS — Z7901 Long term (current) use of anticoagulants: Secondary | ICD-10-CM | POA: Diagnosis not present

## 2020-05-31 DIAGNOSIS — Z86718 Personal history of other venous thrombosis and embolism: Secondary | ICD-10-CM | POA: Diagnosis not present

## 2020-05-31 DIAGNOSIS — I129 Hypertensive chronic kidney disease with stage 1 through stage 4 chronic kidney disease, or unspecified chronic kidney disease: Secondary | ICD-10-CM | POA: Diagnosis not present

## 2020-05-31 DIAGNOSIS — M4802 Spinal stenosis, cervical region: Secondary | ICD-10-CM | POA: Diagnosis not present

## 2020-05-31 DIAGNOSIS — J309 Allergic rhinitis, unspecified: Secondary | ICD-10-CM | POA: Diagnosis not present

## 2020-05-31 DIAGNOSIS — M109 Gout, unspecified: Secondary | ICD-10-CM | POA: Diagnosis not present

## 2020-05-31 DIAGNOSIS — Z87891 Personal history of nicotine dependence: Secondary | ICD-10-CM | POA: Diagnosis not present

## 2020-05-31 DIAGNOSIS — M1712 Unilateral primary osteoarthritis, left knee: Secondary | ICD-10-CM | POA: Diagnosis not present

## 2020-05-31 DIAGNOSIS — J449 Chronic obstructive pulmonary disease, unspecified: Secondary | ICD-10-CM | POA: Diagnosis not present

## 2020-05-31 DIAGNOSIS — D631 Anemia in chronic kidney disease: Secondary | ICD-10-CM | POA: Diagnosis not present

## 2020-05-31 DIAGNOSIS — M62838 Other muscle spasm: Secondary | ICD-10-CM | POA: Diagnosis not present

## 2020-05-31 DIAGNOSIS — E1142 Type 2 diabetes mellitus with diabetic polyneuropathy: Secondary | ICD-10-CM | POA: Diagnosis not present

## 2020-05-31 DIAGNOSIS — Z96651 Presence of right artificial knee joint: Secondary | ICD-10-CM | POA: Diagnosis not present

## 2020-06-04 ENCOUNTER — Telehealth: Payer: Self-pay | Admitting: Primary Care

## 2020-06-04 ENCOUNTER — Observation Stay
Admission: EM | Admit: 2020-06-04 | Discharge: 2020-06-06 | Disposition: A | Discharge status: Home / Self Care | Payer: Medicare Other | Attending: Internal Medicine | Admitting: Internal Medicine

## 2020-06-04 ENCOUNTER — Observation Stay: Payer: Medicare Other

## 2020-06-04 ENCOUNTER — Other Ambulatory Visit: Payer: Self-pay

## 2020-06-04 ENCOUNTER — Emergency Department: Payer: Medicare Other

## 2020-06-04 ENCOUNTER — Other Ambulatory Visit: Payer: Medicare Other | Admitting: Primary Care

## 2020-06-04 DIAGNOSIS — R4781 Slurred speech: Principal | ICD-10-CM | POA: Insufficient documentation

## 2020-06-04 DIAGNOSIS — E119 Type 2 diabetes mellitus without complications: Secondary | ICD-10-CM | POA: Insufficient documentation

## 2020-06-04 DIAGNOSIS — G459 Transient cerebral ischemic attack, unspecified: Secondary | ICD-10-CM

## 2020-06-04 DIAGNOSIS — F0390 Unspecified dementia without behavioral disturbance: Secondary | ICD-10-CM | POA: Diagnosis not present

## 2020-06-04 DIAGNOSIS — R29818 Other symptoms and signs involving the nervous system: Secondary | ICD-10-CM | POA: Diagnosis not present

## 2020-06-04 DIAGNOSIS — Z7901 Long term (current) use of anticoagulants: Secondary | ICD-10-CM | POA: Diagnosis not present

## 2020-06-04 DIAGNOSIS — R001 Bradycardia, unspecified: Secondary | ICD-10-CM | POA: Diagnosis not present

## 2020-06-04 DIAGNOSIS — R569 Unspecified convulsions: Secondary | ICD-10-CM

## 2020-06-04 DIAGNOSIS — Z79899 Other long term (current) drug therapy: Secondary | ICD-10-CM | POA: Diagnosis not present

## 2020-06-04 DIAGNOSIS — D631 Anemia in chronic kidney disease: Secondary | ICD-10-CM | POA: Diagnosis present

## 2020-06-04 DIAGNOSIS — N183 Chronic kidney disease, stage 3 unspecified: Secondary | ICD-10-CM | POA: Diagnosis present

## 2020-06-04 DIAGNOSIS — M6281 Muscle weakness (generalized): Secondary | ICD-10-CM | POA: Diagnosis not present

## 2020-06-04 DIAGNOSIS — R0602 Shortness of breath: Secondary | ICD-10-CM

## 2020-06-04 DIAGNOSIS — Z86718 Personal history of other venous thrombosis and embolism: Secondary | ICD-10-CM

## 2020-06-04 DIAGNOSIS — Z87891 Personal history of nicotine dependence: Secondary | ICD-10-CM | POA: Diagnosis not present

## 2020-06-04 DIAGNOSIS — I10 Essential (primary) hypertension: Secondary | ICD-10-CM | POA: Diagnosis present

## 2020-06-04 DIAGNOSIS — E785 Hyperlipidemia, unspecified: Secondary | ICD-10-CM | POA: Diagnosis present

## 2020-06-04 DIAGNOSIS — I129 Hypertensive chronic kidney disease with stage 1 through stage 4 chronic kidney disease, or unspecified chronic kidney disease: Secondary | ICD-10-CM | POA: Insufficient documentation

## 2020-06-04 DIAGNOSIS — N184 Chronic kidney disease, stage 4 (severe): Secondary | ICD-10-CM | POA: Insufficient documentation

## 2020-06-04 DIAGNOSIS — Z20822 Contact with and (suspected) exposure to covid-19: Secondary | ICD-10-CM | POA: Diagnosis not present

## 2020-06-04 DIAGNOSIS — F0280 Dementia in other diseases classified elsewhere without behavioral disturbance: Secondary | ICD-10-CM | POA: Diagnosis present

## 2020-06-04 DIAGNOSIS — R531 Weakness: Secondary | ICD-10-CM | POA: Diagnosis not present

## 2020-06-04 DIAGNOSIS — J9811 Atelectasis: Secondary | ICD-10-CM | POA: Diagnosis not present

## 2020-06-04 DIAGNOSIS — G4733 Obstructive sleep apnea (adult) (pediatric): Secondary | ICD-10-CM

## 2020-06-04 LAB — COMPREHENSIVE METABOLIC PANEL
ALT: 17 U/L (ref 0–44)
AST: 25 U/L (ref 15–41)
Albumin: 3.5 g/dL (ref 3.5–5.0)
Alkaline Phosphatase: 101 U/L (ref 38–126)
Anion gap: 7 (ref 5–15)
BUN: 45 mg/dL — ABNORMAL HIGH (ref 8–23)
CO2: 24 mmol/L (ref 22–32)
Calcium: 9.4 mg/dL (ref 8.9–10.3)
Chloride: 109 mmol/L (ref 98–111)
Creatinine, Ser: 1.79 mg/dL — ABNORMAL HIGH (ref 0.61–1.24)
GFR, Estimated: 38 mL/min — ABNORMAL LOW (ref >60)
Glucose, Bld: 86 mg/dL (ref 70–99)
Potassium: 4.9 mmol/L (ref 3.5–5.1)
Sodium: 140 mmol/L (ref 135–145)
Total Bilirubin: 0.5 mg/dL (ref 0.3–1.2)
Total Protein: 7.3 g/dL (ref 6.5–8.1)

## 2020-06-04 LAB — DIFFERENTIAL
Abs Immature Granulocytes: 0.01 10*3/uL (ref 0.00–0.07)
Basophils Absolute: 0 10*3/uL (ref 0.0–0.1)
Basophils Relative: 1 %
Eosinophils Absolute: 0.2 10*3/uL (ref 0.0–0.5)
Eosinophils Relative: 4 %
Immature Granulocytes: 0 %
Lymphocytes Relative: 27 %
Lymphs Abs: 1.4 10*3/uL (ref 0.7–4.0)
Monocytes Absolute: 0.7 10*3/uL (ref 0.1–1.0)
Monocytes Relative: 13 %
Neutro Abs: 2.9 10*3/uL (ref 1.7–7.7)
Neutrophils Relative %: 55 %

## 2020-06-04 LAB — CBC
HCT: 30.2 % — ABNORMAL LOW (ref 39.0–52.0)
Hemoglobin: 9.5 g/dL — ABNORMAL LOW (ref 13.0–17.0)
MCH: 22.7 pg — ABNORMAL LOW (ref 26.0–34.0)
MCHC: 31.5 g/dL (ref 30.0–36.0)
MCV: 72.1 fL — ABNORMAL LOW (ref 80.0–100.0)
Platelets: 184 10*3/uL (ref 150–400)
RBC: 4.19 MIL/uL — ABNORMAL LOW (ref 4.22–5.81)
RDW: 19.6 % — ABNORMAL HIGH (ref 11.5–15.5)
WBC: 5.2 10*3/uL (ref 4.0–10.5)
nRBC: 0 % (ref 0.0–0.2)

## 2020-06-04 LAB — RESP PANEL BY RT-PCR (FLU A&B, COVID) ARPGX2
Influenza A by PCR: NEGATIVE
Influenza B by PCR: NEGATIVE
SARS Coronavirus 2 by RT PCR: NEGATIVE

## 2020-06-04 LAB — CBG MONITORING, ED: Glucose-Capillary: 82 mg/dL (ref 70–99)

## 2020-06-04 LAB — PROTIME-INR
INR: 1.5 — ABNORMAL HIGH (ref 0.8–1.2)
Prothrombin Time: 17.2 seconds — ABNORMAL HIGH (ref 11.4–15.2)

## 2020-06-04 LAB — APTT: aPTT: 50 seconds — ABNORMAL HIGH (ref 24–36)

## 2020-06-04 MED ORDER — ALBUTEROL SULFATE HFA 108 (90 BASE) MCG/ACT IN AERS
2.0000 | INHALATION_SPRAY | RESPIRATORY_TRACT | Status: DC | PRN
  Filled 2020-06-04: qty 6.7

## 2020-06-04 MED ORDER — ACETAMINOPHEN 650 MG RE SUPP: 650.0000 mg | RECTAL | Status: DC | PRN

## 2020-06-04 MED ORDER — GABAPENTIN 100 MG PO CAPS
100.0000 mg | ORAL_CAPSULE | Freq: Three times a day (TID) | ORAL | Status: DC
  Administered 2020-06-05 – 2020-06-06 (×5): 100 mg via ORAL
  Filled 2020-06-04 (×5): qty 1

## 2020-06-04 MED ORDER — DOCUSATE SODIUM 100 MG PO CAPS: 100.0000 mg | ORAL_CAPSULE | Freq: Every day | ORAL | Status: DC | PRN

## 2020-06-04 MED ORDER — MOMETASONE FURO-FORMOTEROL FUM 200-5 MCG/ACT IN AERO
2.0000 | INHALATION_SPRAY | Freq: Two times a day (BID) | RESPIRATORY_TRACT | Status: DC
  Administered 2020-06-04 – 2020-06-06 (×4): 2 via RESPIRATORY_TRACT
  Filled 2020-06-04: qty 8.8

## 2020-06-04 MED ORDER — ALLOPURINOL 100 MG PO TABS
100.0000 mg | ORAL_TABLET | Freq: Every day | ORAL | Status: DC
  Administered 2020-06-05 – 2020-06-06 (×2): 100 mg via ORAL
  Filled 2020-06-04 (×2): qty 1

## 2020-06-04 MED ORDER — ATORVASTATIN CALCIUM 20 MG PO TABS
20.0000 mg | ORAL_TABLET | Freq: Every day | ORAL | Status: DC
  Administered 2020-06-04 – 2020-06-05 (×2): 20 mg via ORAL
  Filled 2020-06-04 (×2): qty 1

## 2020-06-04 MED ORDER — LEVETIRACETAM 500 MG PO TABS
500.0000 mg | ORAL_TABLET | Freq: Two times a day (BID) | ORAL | Status: DC
  Administered 2020-06-04 – 2020-06-06 (×4): 500 mg via ORAL
  Filled 2020-06-04 (×6): qty 1

## 2020-06-04 MED ORDER — RIVAROXABAN 10 MG PO TABS
10.0000 mg | ORAL_TABLET | Freq: Every day | ORAL | Status: DC
  Administered 2020-06-05 – 2020-06-06 (×2): 10 mg via ORAL
  Filled 2020-06-04 (×3): qty 1

## 2020-06-04 MED ORDER — LORAZEPAM 1 MG PO TABS
1.0000 mg | ORAL_TABLET | Freq: Once | ORAL | Status: AC | PRN
  Administered 2020-06-05: 06:00:00 1 mg via ORAL
  Filled 2020-06-04: qty 1

## 2020-06-04 MED ORDER — GABAPENTIN 300 MG PO CAPS
300.0000 mg | ORAL_CAPSULE | Freq: Every day | ORAL | Status: DC
  Administered 2020-06-04: 300 mg via ORAL
  Filled 2020-06-04: qty 1

## 2020-06-04 MED ORDER — MELATONIN 5 MG PO TABS
5.0000 mg | ORAL_TABLET | Freq: Every evening | ORAL | Status: DC | PRN
  Administered 2020-06-04: 23:00:00 5 mg via ORAL
  Filled 2020-06-04 (×2): qty 1

## 2020-06-04 MED ORDER — ACETAMINOPHEN 160 MG/5ML PO SOLN
650.0000 mg | ORAL | Status: DC | PRN
  Filled 2020-06-04: qty 20.3

## 2020-06-04 MED ORDER — SENNOSIDES-DOCUSATE SODIUM 8.6-50 MG PO TABS: 1.0000 | ORAL_TABLET | Freq: Every evening | ORAL | Status: DC | PRN

## 2020-06-04 MED ORDER — GABAPENTIN 100 MG PO CAPS: 100.0000 mg | ORAL_CAPSULE | Freq: Three times a day (TID) | ORAL | Status: DC

## 2020-06-04 MED ORDER — ACETAMINOPHEN 325 MG PO TABS: 650.0000 mg | ORAL_TABLET | ORAL | Status: DC | PRN

## 2020-06-04 MED ORDER — LAMOTRIGINE 25 MG PO TABS
25.0000 mg | ORAL_TABLET | Freq: Every day | ORAL | Status: DC
  Administered 2020-06-04 – 2020-06-05 (×2): 25 mg via ORAL
  Filled 2020-06-04 (×3): qty 1

## 2020-06-04 MED ORDER — STROKE: EARLY STAGES OF RECOVERY BOOK: Freq: Once | Status: DC

## 2020-06-04 NOTE — ED Notes (Signed)
Medication Reconciliation Report  For Home History Technicians  HIGHLIGHTS:  1. The patient WAS personally interviewed 2. If not, what was the main source used: FAMILY/CAREGIVER TESTIMONY/DOCS 3. Does the patient appear to take any anti-coagulation agents (e.g. warfarin, Eliquis or Xarelto): YES 4. Does the patient appear to take any anti-convulsant agents (e.g. divalproex, levetiracetam or phenytoin): YES 5. Does the patient appear to use any insulin products (e.g. Lantus, Novolin or Humalog): NO 6. Does the patient appear to take any "beta-blockers" (e.g. metoprolol, carvedilol or bisoprolol: YES  BARRIERS:  1. Were there any barriers that prevented or complicated the medication reconciliation process: NO 2. If yes, what was the primary barrier encountered: None 3. Does the patient appear compliant with prescribed medications: YES 4. Does the patient express any barriers with compliance: NO 5. What is the primary barrier the patient reports: None   NOTES:[Include any concerns, remarks or complaints the patient expresses regarding medication therapy. Any observations or other information that might be useful to the treatment team can also be included. Immediate needs or concerns should be referred to the RN or appropriate member of the treatment team.]  The patient was interviewed but the bulk of home medication information comes from daughter at bedside. Patient has been prescribed LAMOTRIGINE 25mg  with instructions to taper up to 150mg  BID over 11 weeks. Patient was supposed to begin taper today but HAS NOT STARTED at this time. Taper instructions:  AM PM Week 1-2: - 25 mg Week 3-4: 25 25 mg Week 5-6: 25 mg 50 mg Week 7-8 50 mg 50 mg Week 9-10: 100 mg 100 mg Week 11: 150 mg 150 mg    Colen Darling, CPhT [place] [facility] [address], [place] [address] [phone]  ** The above is intended solely for informational and/or  communicative purposes. It should in no way be considered an endorsement of any specific treatment, therapy or action. **

## 2020-06-04 NOTE — ED Triage Notes (Signed)
Per pt daughter States he has been under care with Dr. Manuella Ghazi for possible focal seizures and has an order placed for an MRI, states this morning she gave him his regular meds at 7 and when she went back at 0800 he was confused and emotional, speech was slurred, denies pt in not having any weakness issues above his baseline. States his mental status has not improved since this morning. Called Dr., Manuella Ghazi and referred to the ED to r/o stroke.

## 2020-06-04 NOTE — H&P (Signed)
History and Physical    [facility]. TZG:[id number] DOB: 06-Dec-1940 DOA: 06/04/2020  PCP: [name], DO  Patient coming from: Home  I have personally briefly reviewed patient's old medical records in [place]  Chief Complaint: Slurred speech  HPI: Justin Shannon. is a 79 y.o. 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  History is supplemented by daughter at bedside.  This morning, patient's daughter was giving him his medications when she noticed that his speech was slurred, significantly changed from baseline.  He appeared awake and alert but his speech was dysarthric.  She did not see any asymmetry of his face.  He has chronic left-sided weakness which did not seem significantly changed from his baseline.  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  His daughter called her neurologist who recommended he come to the ED for stroke evaluation.  Symptoms lasted approximately 2 hours before resolving on their own and at time of admission patient appears to be near his baseline.  Daughter states that patient was hospitalized for COVID-19 pneumonia 06/24/2019 at which time he was treated with remdesivir and steroids.  Since then he has had breathing issues and required Advair and rescue albuterol at home.  He has a chronic nonproductive cough and intermittent shortness of breath.  Recently he has been followed by his neurologist for evaluation of suspected focal partial seizures described as a "trancelike" gaze with rhythmic tremor of his left foot.  He has been taking Keppra  for many years but is now 68 has now started him on gabapentin with plan to start and titrate up lamotrigine over an 11-week period with eventual taper off of Keppra.  Daughter states that he has been having increased tremors in his hands, right greater than left, slow movement, and visual hallucinations (more notable at night).  Patient also states that his short-term memory is impaired.  His neurologist is concerned for Lewy body dementia  Currently patient denies any recent chest pain, palpitations, subjective fevers, chills, diaphoresis, abdominal pain, dysuria.  He has chronic swelling of both of his legs.  He is largely wheelchair-bound.  On occasion will use a walker but requires maximum assistance per daughter.  ED Course:  Initial vitals showed BP 128/65, pulse 87, RR 17, temp 97.9 F, SPO2 98% on room air.  Labs show WBC 5.2, hemoglobin 9.5, platelets 184,000, sodium 140, potassium 4.9, bicarb 24, BUN 45, creatinine 1.79, GFR 38.  SARS-CoV-2 PCR is collected and pending.  CT head without contrast is negative for evidence of acute intracranial abnormality.  Mild cerebral atrophy and chronic small vessel ischemic disease is seen, stable when compared to prior CT.  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  The hospitalist service was consulted to admit for further evaluation and management.  Review of Systems:  All systems reviewed and are negative except as documented in history of present illness above.   Past Medical History:  Diagnosis Date  . Arthritis   . Bilateral swelling of feet   . BPH (benign prostatic hyperplasia)   . Chronic kidney disease   . DVT (deep venous thrombosis) ([place])  right lower extremity  . History of gout   . Seizures ([place])   . Sleep apnea   . Tendonitis of foot    left    Past Surgical History:  Procedure Laterality Date  . APPENDECTOMY    . APPLICATION OF WOUND VAC Right 11/02/2019   Procedure: APPLICATION OF WOUND VAC;   Surgeon: Dereck Leep, MD;  Location: [facility]S;  Service: Orthopedics;  Laterality: Right;  [id number]  . HERNIA REPAIR     UMBILICAL  . IVC FILTER INSERTION N/A 10/25/2019   Procedure: IVC FILTER INSERTION;  Surgeon: Katha Cabal, MD;  Location: [facility];  Service: Cardiovascular;  Laterality: N/A;  . KNEE ARTHROPLASTY Right 11/02/2019   Procedure: COMPUTER ASSISTED TOTAL KNEE ARTHROPLASTY;  Surgeon: Dereck Leep, MD;  Location: [facility] OR[facility];  Service: Orthopedics;  Laterality: Right;  . PATELLAR TENDON REPAIR Right 12/05/2019   Procedure: PATELLA TENDON REPAIR;  Surgeon: Dereck Leep, MD;  Location: [facility] ORS;  Service: Orthopedics;  Laterality: Right;  . PROSTATE SURGERY      Social History:  reports that he quit smoking about 51 years ago. His smoking use included cigarettes. He has a 2.00 pack-year smoking history. He has never used smokeless tobacco. He reports that he does not drink alcohol and does not use drugs.  Allergies  Allergen Reactions  . Shellfish Allergy Anaphylaxis and Other (See Comments)    gout  . Iodinated Diagnostic Agents Other (See Comments)    Chronic kidney disease 3b  . Nsaids Other (See Comments)    Chronic kidney disease 3b  . Rivastigmine Other (See Comments)    Side effect and not a true allergy    Family History  Problem Relation Age of Onset  . Arthritis Mother   . Hypertension Mother   . Diabetes Brother      Prior to Admission medications   Medication Sig Start Date End Date Taking? Authorizing Provider  Accu-Chek FastClix Lancets MISC USE TO CHECK GLUCOSE TWICE DAILY 06/07/19   [provider]  ACCU-CHEK GUIDE test strip USE STRIP TO [place] DAILY 06/07/19   [provider]  albuterol (VENTOLIN HFA) 108 (90 Base) MCG/ACT inhaler Inhale 2 puffs into the lungs every 4 (four) hours as needed for wheezing.    [provider]  allopurinol (ZYLOPRIM) 100 MG tablet Take 100 mg by mouth  daily.    [provider]  atorvastatin (LIPITOR) 20 MG tablet Take 1 tablet (20 mg total) by mouth at bedtime. 05/21/20   Karamalegos, Devonne Doughty, DO  Blood Glucose Monitoring Suppl (ACCU-CHEK GUIDE ME) w/Device KIT 2 (two) times daily. 06/07/19   [provider]  docusate sodium (COLACE) 100 MG capsule Take 100 mg by mouth daily as needed for mild constipation or moderate constipation.     [provider]  fexofenadine (ALLEGRA) 180 MG tablet Take 180 mg by mouth daily as needed for allergies.     [provider]  fluticasone (FLONASE) 50 MCG/ACT nasal spray Place 2 sprays into both nostrils daily as needed for allergies.  09/26/14   [provider]  Fluticasone-Salmeterol (ADVAIR) 250-50 MCG/DOSE AEPB Inhale 1 puff into the lungs 2 (two) times daily as needed (Wheezing).    [provider]  levETIRAcetam (KEPPRA) 500 MG tablet Take 500 mg by mouth 2 (two) times daily.  03/08/19   [provider]  Magnesium Citrate 100 MG TABS Take 100 mg by mouth daily.    [provider]  melatonin 5 MG TABS Take 5 mg by mouth.    [provider]  metoprolol succinate (TOPROL-XL) 50 MG 24 hr tablet Take 50 mg by mouth daily.  10/30/17   [provider]  Multiple Vitamin (ONE-A-DAY MENS PO) Take 1 tablet by mouth daily.     [provider]  torsemide (DEMADEX) 20 MG tablet Take 20 mg by mouth daily. Patient not taking: Reported on 05/21/2020 08/09/19   [provider]  triamcinolone cream (KENALOG) 0.1 % APPLY TOPICALLY TWICE DAILY AS NEEDED 03/07/20   [provider]  vitamin C (ASCORBIC ACID) 500 MG tablet Take 500 mg by mouth daily.  Patient not taking: Reported on 05/21/2020    [provider]  XARELTO 10 MG TABS tablet Take 10 mg by mouth daily. 04/21/20   [provider]    Physical Exam: Vitals:   06/04/20 1243 06/04/20 1245 06/04/20 1637  BP: 128/65  (!) 153/78  Pulse:  (!) 57  (!) 57  Resp: 17  16  Temp: 97.9 F (36.6 C)  98.9 F (37.2 C)  TempSrc: Oral  Oral  SpO2: 98%  98%  Weight:  134.6 kg   Height:  _0  (1.778 m)    Constitutional: Obese man resting in bed with head slightly elevated, NAD, calm, comfortable Eyes: PERRL, EOMI, lids and conjunctivae normal.  Arcus senilis. ENMT: Mucous membranes are moist. Posterior pharynx clear of any exudate or lesions.Normal dentition.  Neck: normal, supple, no masses. Respiratory: Upper airway wheezing otherwise clear to auscultation lower lung fields. Normal respiratory effort. No accessory muscle use.  Cardiovascular: Regular rate and rhythm, no murmurs / rubs / gallops.  Large lower extremities with lymphedema. 2+ pedal pulses. Abdomen: no tenderness, no masses palpated. No hepatosplenomegaly. Bowel sounds positive.  Musculoskeletal: no clubbing / cyanosis. No joint deformity upper and lower extremities. No contractures. Normal muscle tone.  Skin: no rashes, lesions, ulcers. No induration Neurologic: CN 2-12 grossly intact. Sensation slightly diminished lateral right lower extremity but otherwise intact,  Strength 5/5 bilateral upper extremities.  Strength somewhat limited lower extremities due to body habitus but appears largely intact as he is able to raise legs against gravity. Psychiatric: Normal judgment and insight. Alert and oriented x 3. Normal mood.   Labs on Admission: I have personally reviewed following labs and imaging studies  CBC: Recent Labs  Lab 06/04/20 1323  WBC 5.2  NEUTROABS 2.9  HGB 9.5*  HCT 30.2*  MCV 72.1*  PLT 250   Basic Metabolic Panel: Recent Labs  Lab 06/04/20 1323  NA 140  K 4.9  CL 109  CO2 24  GLUCOSE 86  BUN 45*  CREATININE 1.79*  CALCIUM 9.4   GFR: Estimated Creatinine Clearance: 47 mL/min (A) (by C-G formula based on SCr of 1.79 mg/dL (H)). Liver Function Tests: Recent Labs  Lab 06/04/20 1323  AST 25  ALT 17  ALKPHOS 101  BILITOT 0.5  PROT  7.3  ALBUMIN 3.5   No results for input(s): LIPASE, AMYLASE in the last 168 hours. No results for input(s): AMMONIA in the last 168 hours. Coagulation Profile: Recent Labs  Lab 06/04/20 1323  INR 1.5*   Cardiac Enzymes: No results for input(s): CKTOTAL, CKMB, CKMBINDEX, TROPONINI in the last 168 hours. BNP (last 3 results) No results for input(s): PROBNP in the last 8760 hours. HbA1C: No results for input(s): HGBA1C in the last 72 hours. CBG: Recent Labs  Lab 06/04/20 1326  GLUCAP 82  Lipid Profile: No results for input(s): CHOL, HDL, LDLCALC, TRIG, CHOLHDL, LDLDIRECT in the last 72 hours. Thyroid Function Tests: No results for input(s): TSH, T4TOTAL, FREET4, T3FREE, THYROIDAB in the last 72 hours. Anemia Panel: No results for input(s): VITAMINB12, FOLATE, FERRITIN, TIBC, IRON, RETICCTPCT in the last 72 hours. Urine analysis:    Component Value Date/Time   COLORURINE STRAW (A) 12/07/2019 1200   APPEARANCEUR CLEAR (A) 12/07/2019 1200   APPEARANCEUR Clear 08/03/2019 1102   LABSPEC 1.006 12/07/2019 1200   LABSPEC 1.012 09/02/2012 0435   PHURINE 5.0 12/07/2019 1200   GLUCOSEU NEGATIVE 12/07/2019 1200   GLUCOSEU Negative 09/02/2012 0435   HGBUR SMALL (A) 12/07/2019 1200   BILIRUBINUR NEGATIVE 12/07/2019 1200   BILIRUBINUR Negative 08/03/2019 1102   BILIRUBINUR Negative 09/02/2012 0435   KETONESUR NEGATIVE 12/07/2019 1200   PROTEINUR NEGATIVE 12/07/2019 1200   NITRITE NEGATIVE 12/07/2019 1200   LEUKOCYTESUR TRACE (A) 12/07/2019 1200   LEUKOCYTESUR Negative 09/02/2012 0435    Radiological Exams on Admission: CT HEAD WO CONTRAST  Result Date: 06/04/2020 CLINICAL DATA:  Neuro deficit, acute, stroke suspected. Additional history provided: 2 are. Of slurred speech. EXAM: CT HEAD WITHOUT CONTRAST TECHNIQUE: Contiguous axial images were obtained from the base of the skull through the vertex without intravenous contrast. COMPARISON:  Prior head CT examinations 05/23/2020 and  earlier. Brain MRI 09/03/2012. FINDINGS: Brain: Mild cerebral atrophy. Mild patchy and ill-defined hypoattenuation within the cerebral white matter is nonspecific, but compatible with chronic small vessel ischemic disease. Redemonstrated small chronic infarct within the left cerebellar hemisphere. There is no acute intracranial hemorrhage. No demarcated cortical infarct. No extra-axial fluid collection. No evidence of intracranial mass. No midline shift. Vascular: No hyperdense vessel.  Atherosclerotic calcifications. Skull: Normal. Negative for fracture or focal lesion. Sinuses/Orbits: Visualized orbits show no acute finding. No significant paranasal sinus disease at the imaged levels. IMPRESSION: No evidence of acute intracranial abnormality. Mild cerebral atrophy and chronic small vessel ischemic disease, stable as compared to the head CT of 05/23/2020. Redemonstrated small chronic left cerebellar infarct. Electronically Signed   By: Kellie Simmering DO   On: 06/04/2020 13:20    EKG: Personally reviewed. Sinus bradycardia with sinus arrhythmia, without acute ischemic changes.  Not significantly changed when compared to previous EKGs.  Assessment/Plan Principal Problem:   Slurred speech Active Problems:   History of DVT (deep vein thrombosis)   OSA on CPAP   Seizures (HCC)   Anemia of chronic kidney failure, stage 3 (moderate) (HCC)   Lewy body dementia (HCC)   CKD (chronic kidney disease) stage 3, GFR 30-59 ml/min (HCC)   Essential hypertension   Hyperlipidemia  Justin Shannon. is a 79 y.o. 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  Slurred speech: Patient with transient slurred speech morning of 06/04/2020.   No other obvious new focal deficits noted per daughter.  Symptoms were different than previous seizure-like episodes.  He appears to be at his baseline at time of admission.  Admitted for TIA work-up. -Obtain MRI brain without contrast, can use Ativan if needed prior to MRI -Obtain carotid Dopplers, echocardiogram -Monitor on telemetry, continue neurochecks -PT/OT/SLP eval -Check A1c, lipid panel -Allow permissive hypertension for now  Seizure disorder: Suspected partial focal seizures per neurology.  Has  been on Keppra for many years with plans to start Lamictal 25 mg tonight with slow up titration over 11-week period.  Also on gabapentin for left-sided neuropathic pain. -Continue Keppra 500 mg twice daily -Continue Lamictal 25 mg nightly as planned -Continue gabapentin  History of RLE DVT: Continue Xarelto.  Also has IVC filter which was placed in May 2021 due to time off anticoagulation for orthopedic surgical procedures of right knee.  CKD stage IIIb: Renal function stable with improved from recent baseline creatinine ~2.0.  Continue monitor.  Anemia of chronic disease: Chronic and appears stable without obvious bleeding.  Continue to monitor.  Hypertension: Holding home metoprolol to allow for permissive hypertension for now.  Hyperlipidemia: Continue atorvastatin.  History of COVID-19 pneumonia: Hospitalized January 2021 with residual chronic respiratory symptoms.  Upper airway wheezing on admission.  Check chest x-ray.  Continue Advair and as needed albuterol.  Suspected Lewy body dementia: Following with neurology.  Continue Keppra.  Delirium and fall precautions.  OSA: Continue CPAP nightly.  DVT prophylaxis: Xarelto Code Status: Full code, confirmed with patient Family Communication: Discussed with patient's daughter at bedside Disposition Plan: From home-lives with daughter, dispo pending TIA work-up and PT/OT eval Consults called: None Admission status:  Status  is: Observation  The patient remains OBS appropriate and will d/c before 2 midnights.  Dispo: The patient is from: Home              Anticipated d/c is to: Home              Anticipated d/c date is: 1 day              Patient currently is not medically stable to d/c.  Zada Finders MD [facility]  If 7PM-7AM, please contact night-coverage [email or web address]  06/04/2020, 6:32 PM

## 2020-06-04 NOTE — Telephone Encounter (Signed)
Rec'd call from daughter stating that she needed to cancel today's appointment with the Palliative NP due to patient was sent to [facility].  I have notified NP and Hospital Liaison Team.

## 2020-06-04 NOTE — ED Provider Notes (Signed)
[facility] Emergency Department Provider Note   ____________________________________________   I have reviewed the triage vital signs and the nursing notes.   HISTORY  Chief Complaint Slurred speech  History limited by and level 5 caveat due to: Dementia. History primarily obtained from family at bedside.   HPI [name] is a 79 y.o. male who presents to the emergency department today accompanied by family because of concern for episode of slurred speech. The history is primarily obtained from the daughter at bedside. The patient currently is being followed by Dr. Manuella Ghazi with neurology for concern for dementia as well as possible focal seizures. This morning the patient noticed an episode of slurred speech. Daughter states that the patient appeared to be at his baseline mental status save for the slurred speech. This did gradually improve over a couple of hours. 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 Did have a fall recently but did not hit his head.    Records reviewed. Per medical record review patient has a history of DVT, lew body dementia, seizures.   Past Medical History:  Diagnosis Date  . Arthritis   . Bilateral swelling of feet   . BPH (benign prostatic hyperplasia)   . Chronic kidney disease   . DVT (deep venous thrombosis) (HCC)    right lower extremity  . History of gout   . Seizures ([place]   . Sleep apnea   . Tendonitis of foot    left    Patient Active Problem List   Diagnosis Date Noted  . Lewy body dementia ([place] 05/21/2020  . Hyperlipidemia associated with type 2 diabetes mellitus (HF[place] 05/21/2020  . Status post total right knee replacement 01/22/2020  . Anemia of chronic renal failure 01/16/2020  . Total knee replacement status 11/02/2019  . History of total knee arthroplasty 11/02/2019  . PAD (peripheral artery disease) ([facility] 10/13/2019  . Benign prostatic hyperplasia   . Anemia due to stage 4  chronic kidney disease ([place] 06/24/2019  . Generalized weakness 06/24/2019  . Anemia in chronic kidney disease (CODE) 06/24/2019  . Asthenia 06/24/2019  . Primary osteoarthritis of right knee 04/03/2019  . Ankle ulcer ([facility] 05/08/2018  . Use of cane as ambulatory aid 04/27/2018  . Dependence on other enabling machines and devices 04/27/2018  . Type 2 diabetes mellitus with stage 4 chronic kidney disease, without long-term current use of insulin ([facility] 06/11/2017  . Lymphedema 05/04/2016  . Chronic venous insufficiency 05/04/2016  . Toe sprain 01/24/2016  . Morbid obesity with body mass index of 40.0-44.9 in adult ([facility] 05/24/2014  . Body mass index (BMI)40.0-44.9, adult 05/24/2014  . OSA on CPAP 01/10/2014  . CKD (chronic kidney disease), stage IV (HE[place] 11/22/2013  . Allergic rhinitis 11/06/2013  . Benign hypertension with CKD (chronic kidney disease) stage IV ([place] 11/06/2013  . Epilepsy ([facility] 11/06/2013  . Gout 11/06/2013  . History of DVT (deep vein thrombosis) 11/06/2013  . Chronic deep vein thrombosis (DVT) of proximal vein of lower extremity ([facility] 11/06/2013  . 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 Personal history of other venous thrombosis and embolism 11/06/2013    Past Surgical History:  Procedure Laterality Date  . APPENDECTOMY    . APPLICATION OF WOUND VAC Right 11/02/2019   Procedure: APPLICATION OF WOUND VAC;  Surgeon: HDereck Leep MD;  Location: [facility] ORS;  Service: Orthopedics;  Laterality: Right;  [id number]  .  HERNIA REPAIR     UMBILICAL  . IVC FILTER INSERTION N/A 10/25/2019   Procedure: IVC FILTER INSERTION;  Surgeon: Katha Cabal, MD;  Location: [place] CV LAB;  Service: Cardiovascular;  Laterality: N/A;  . KNEE ARTHROPLASTY Right 11/02/2019   Procedure: COMPUTER ASSISTED TOTAL KNEE ARTHROPLASTY;  Surgeon: Dereck Leep, MD;  Location: [facility] ORS;  Service: Orthopedics;   Laterality: Right;  . PATELLAR TENDON REPAIR Right 12/05/2019   Procedure: PATELLA TENDON REPAIR;  Surgeon: Dereck Leep, MD;  Location: [facility] ORS;  Service: Orthopedics;  Laterality: Right;  . PROSTATE SURGERY      Prior to Admission medications   Medication Sig Start Date End Date Taking? Authorizing Provider  Accu-Chek FastClix Lancets MISC USE TO CHECK GLUCOSE TWICE DAILY 06/07/19   [provider]  ACCU-CHEK GUIDE test strip USE STRIP TO [place] DAILY 06/07/19   [provider]  albuterol (VENTOLIN HFA) 108 (90 Base) MCG/ACT inhaler Inhale 2 puffs into the lungs every 4 (four) hours as needed for wheezing.    [provider]  allopurinol (ZYLOPRIM) 100 MG tablet Take 100 mg by mouth daily.    [provider]  atorvastatin (LIPITOR) 20 MG tablet Take 1 tablet (20 mg total) by mouth at bedtime. 05/21/20   Karamalegos, Devonne Doughty, DO  Blood Glucose Monitoring Suppl (ACCU-CHEK GUIDE ME) w/Device KIT 2 (two) times daily. 06/07/19   [provider]  docusate sodium (COLACE) 100 MG capsule Take 100 mg by mouth daily as needed for mild constipation or moderate constipation.     [provider]  fexofenadine (ALLEGRA) 180 MG tablet Take 180 mg by mouth daily as needed for allergies.     [provider]  fluticasone (FLONASE) 50 MCG/ACT nasal spray Place 2 sprays into both nostrils daily as needed for allergies.  09/26/14   [provider]  Fluticasone-Salmeterol (ADVAIR) 250-50 MCG/DOSE AEPB Inhale 1 puff into the lungs 2 (two) times daily as needed (Wheezing).    [provider]  levETIRAcetam (KEPPRA) 500 MG tablet Take 500 mg by mouth 2 (two) times daily.  03/08/19   [provider]  Magnesium Citrate 100 MG TABS Take 100 mg by mouth daily.    [provider]  melatonin 5 MG TABS Take 5 mg by mouth.    [provider]  metoprolol succinate (TOPROL-XL) 50 MG 24 hr tablet Take 50  mg by mouth daily.  10/30/17   [provider]  Multiple Vitamin (ONE-A-DAY MENS PO) Take 1 tablet by mouth daily.     [provider]  torsemide (DEMADEX) 20 MG tablet Take 20 mg by mouth daily. Patient not taking: Reported on 05/21/2020 08/09/19   [provider]  triamcinolone cream (KENALOG) 0.1 % APPLY TOPICALLY TWICE DAILY AS NEEDED 03/07/20   [provider]  vitamin C (ASCORBIC ACID) 500 MG tablet Take 500 mg by mouth daily.  Patient not taking: Reported on 05/21/2020    [provider]  XARELTO 10 MG TABS tablet Take 10 mg by mouth daily. 04/21/20   [provider]    Allergies Shellfish allergy, Iodinated diagnostic agents, Nsaids, and Rivastigmine  Family History  Problem Relation Age of Onset  . Arthritis Mother   . Hypertension Mother   . Diabetes Brother     Social History Social History   Tobacco Use  . Smoking status: Former Smoker    Packs/day: 1.00    Years: 2.00    Pack  years: 2.00    Types: Cigarettes    Quit date: 06/23/1968    Years since quitting: 51.9  . Smokeless tobacco: Never Used  . Tobacco comment: 25 years ago quit  Vaping Use  . Vaping Use: Never used  Substance Use Topics  . Alcohol use: No  . Drug use: No    Review of Systems Constitutional: No fever/chills Eyes: No visual changes. ENT: No sore throat. Cardiovascular: Denies chest pain. Respiratory: Denies shortness of breath. Gastrointestinal: No abdominal pain.  No nausea, no vomiting.  No diarrhea.   Genitourinary: Negative for dysuria. Musculoskeletal: Negative for back pain. Skin: Negative for rash. Neurological: Positive for slurred speech.  ____________________________________________   PHYSICAL EXAM:  VITAL SIGNS: ED Triage Vitals  Enc Vitals Group     BP 06/04/20 1243 128/65     Pulse Rate 06/04/20 1243 (!) 57     Resp 06/04/20 1243 17     Temp 06/04/20 1243 97.9 F (36.6 C)     Temp Source 06/04/20 1243 Oral      SpO2 06/04/20 1243 98 %     Weight 06/04/20 1245 296 lb 12.8 oz (134.6 kg)     Height 06/04/20 1245 5' 10"  (1.778 m)     Head Circumference --      [facility] Flow --      Pain Score 06/04/20 1245 0   Constitutional: Awake and alert.  Eyes: Conjunctivae are normal.  ENT      Head: Normocephalic and atraumatic.      Nose: No congestion/rhinnorhea.      Mouth/Throat: Mucous membranes are moist.      Neck: No stridor. Hematological/Lymphatic/Immunilogical: No cervical lymphadenopathy. Cardiovascular: Normal rate, regular rhythm.  No murmurs, rubs, or gallops.  Respiratory: Normal respiratory effort without tachypnea nor retractions. Breath sounds are clear and equal bilaterally. No wheezes/rales/rhonchi. Gastrointestinal: Soft and non tender. No rebound. No guarding.  Genitourinary: Deferred Musculoskeletal: Normal range of motion in all extremities. No lower extremity edema. Neurologic:  Dementia. No seizure like activity. Strength 4+/5 in all extremities. Sensation intact. PERRL. EOMI.  Skin:  Skin is warm, dry and intact. No rash noted. ____________________________________________    LABS (pertinent positives/negatives)  CMP wnl except BUN 45, cr 1.79 CBC wbc 5.2, hgb 9.5, plt 184 INR 1.5 COVID negative ____________________________________________   EKG  I, Nance Pear, attending physician, personally viewed and interpreted this EKG  EKG Time: 1249 Rate: 54 Rhythm: sinus bradycardia Axis: left axis deviation Intervals: qtc 403 QRS: narrow ST changes: no st elevation Impression: abnormal ekg  ____________________________________________    RADIOLOGY  CT head No acute abnormality. Small chronic cerebral infarct.   ____________________________________________   PROCEDURES  Procedures  ____________________________________________   INITIAL IMPRESSION / ASSESSMENT AND PLAN / ED COURSE  Pertinent labs & imaging results that were available during my care of  the patient were reviewed by me and considered in my medical decision making (see chart for details).   Patient presented to the emergency department today accompanied by daughter because of concerns for an episode of slurred speech.  Patient is a somewhat complicated neurology history given work-up for focal seizures and Lewy body dementia.  The time my exam his slurred speech had improved.  He certainly would be outside of the window for TPA and given resolution of symptoms I doubt large vessel occlusion.  Did discuss with the family however concern for possible TIA.  Will plan on admission for further work-up and evaluation.    ____________________________________________  FINAL CLINICAL IMPRESSION(S) / ED DIAGNOSES  Final diagnoses:  Slurred speech     Note: This dictation was prepared with Dragon dictation. Any transcriptional errors that result from this process are unintentional     Nance Pear, MD 06/04/20 2024

## 2020-06-04 NOTE — ED Triage Notes (Signed)
First Nurse Note:  Arrives with family.  Family state patient has been declining over past several weeks.  Has been seen by Neurology who wanted patient to have a MRI.  Family state that this morning when waking patient up from sleep, patient had a two hour episode of slurred speech, which has now resolved.

## 2020-06-04 NOTE — ED Notes (Signed)
Pt using urinal at this time. 300 cc urine emptied into toilet. Pt given warm blanket. NAD noted. Denies further needs at this time. Daughter at bedside.

## 2020-06-05 ENCOUNTER — Observation Stay (HOSPITAL_BASED_OUTPATIENT_CLINIC_OR_DEPARTMENT_OTHER)
Admit: 2020-06-05 | Discharge: 2020-06-05 | Disposition: A | Discharge status: Home / Self Care | Payer: Medicare Other | Attending: Internal Medicine | Admitting: Internal Medicine

## 2020-06-05 ENCOUNTER — Observation Stay: Payer: Medicare Other

## 2020-06-05 DIAGNOSIS — D1779 Benign lipomatous neoplasm of other sites: Secondary | ICD-10-CM | POA: Diagnosis not present

## 2020-06-05 DIAGNOSIS — F0281 Dementia in other diseases classified elsewhere with behavioral disturbance: Secondary | ICD-10-CM

## 2020-06-05 DIAGNOSIS — I6782 Cerebral ischemia: Secondary | ICD-10-CM | POA: Diagnosis not present

## 2020-06-05 DIAGNOSIS — G3183 Dementia with Lewy bodies: Secondary | ICD-10-CM

## 2020-06-05 DIAGNOSIS — I340 Nonrheumatic mitral (valve) insufficiency: Secondary | ICD-10-CM | POA: Diagnosis not present

## 2020-06-05 DIAGNOSIS — G459 Transient cerebral ischemic attack, unspecified: Secondary | ICD-10-CM

## 2020-06-05 DIAGNOSIS — D170 Benign lipomatous neoplasm of skin and subcutaneous tissue of head, face and neck: Secondary | ICD-10-CM | POA: Diagnosis not present

## 2020-06-05 DIAGNOSIS — R4781 Slurred speech: Secondary | ICD-10-CM

## 2020-06-05 DIAGNOSIS — I6522 Occlusion and stenosis of left carotid artery: Secondary | ICD-10-CM | POA: Diagnosis not present

## 2020-06-05 DIAGNOSIS — G319 Degenerative disease of nervous system, unspecified: Secondary | ICD-10-CM | POA: Diagnosis not present

## 2020-06-05 LAB — ECHOCARDIOGRAM COMPLETE
AV Mean grad: 4.5 mmHg
AV Peak grad: 8.8 mmHg
Ao pk vel: 1.49 m/s
Area-P 1/2: 3.21 cm2
Height: 70 in
Weight: 4748.8 oz

## 2020-06-05 LAB — LIPID PANEL
Cholesterol: 96 mg/dL (ref 0–200)
HDL: 57 mg/dL (ref >40)
LDL Cholesterol: 23 mg/dL (ref 0–99)
Total CHOL/HDL Ratio: 1.7 RATIO
Triglycerides: 81 mg/dL (ref <150)
VLDL: 16 mg/dL (ref 0–40)

## 2020-06-05 LAB — HEMOGLOBIN A1C
Hgb A1c MFr Bld: 5.9 % — ABNORMAL HIGH (ref 4.8–5.6)
Mean Plasma Glucose: 122.63 mg/dL

## 2020-06-05 MED ORDER — QUETIAPINE FUMARATE 25 MG PO TABS
12.5000 mg | ORAL_TABLET | Freq: Two times a day (BID) | ORAL | Status: DC
  Administered 2020-06-05 – 2020-06-06 (×3): 12.5 mg via ORAL
  Filled 2020-06-05 (×3): qty 1

## 2020-06-05 MED ORDER — FLUTICASONE PROPIONATE 50 MCG/ACT NA SUSP
2.0000 | Freq: Every day | NASAL | Status: DC | PRN
  Filled 2020-06-05: qty 16

## 2020-06-05 NOTE — TOC Initial Note (Signed)
Transition of Care (TOC) - Initial/Assessment Note    Patient Details  Name: Justin Shannon. MRN: [id number] Date of Birth: [birth year]  Transition of Care [facility]) CM/SW Contact:    Magnus Ivan, LCSW Phone Number: 06/05/2020, 4:11 PM  Clinical Narrative:            CSW spoke to patient's daughter and wife via phone. Patient lives with his daughter Justin Shannon who takes him to appointments. PCP is Dr. Parks Ranger. Pharmacies are [facility] or [facility]. Patient has a BSC, hospital bed, lift chair, w/c, RW, gait belt, tub bench, pulse ox, and blood pressure cuff at home. Patient is active with [facility] for PT, OT, RN, and Aide services per [place]. Patient has been to SN[facility] rehab [facility] and at [facility]. Patient was scheduled to start with [facility] services prior to this hospitalization. CSW will continue to follow.       Expected Discharge Plan: [place] Barriers to Discharge: Continued Medical Work up   Patient Goals and CMS Choice Patient states their goals for this hospitalization and ongoing recovery are:: TBD CMS Medicare.gov Compare Post Acute Care list provided to:: Patient Represenative (must comment) Choice offered to / list presented to : Adult Children,Spouse  Expected Discharge Plan and Services Expected Discharge Plan: [place]       Living arrangements for the past 2 months: Single Family Home                                      Prior Living Arrangements/Services Living arrangements for the past 2 months: Single Family Home Lives with:: Adult Children Patient language and need for interpreter reviewed:: Yes Do you feel safe going back to the place where you live?: Yes      Need for Family Participation in Patient Care: Yes (Comment) Care giver support system in place?: Yes (comment) Current home services: DME,Home OT,Home PT,Home RN,Homehealth aide Criminal Activity/Legal  Involvement Pertinent to Current Situation/Hospitalization: No - Comment as needed  Activities of Daily Living Home Assistive Devices/Equipment: Eyeglasses,Wheelchair ADL Screening (condition at time of admission) [name] cognitive ability adequate to safely complete daily activities?: Yes Is the patient deaf or have difficulty hearing?: No Does the patient have difficulty seeing, even when wearing glasses/contacts?: No Does the patient have difficulty concentrating, remembering, or making decisions?: Yes Patient able to express need for assistance with ADLs?: Yes Does the patient have difficulty dressing or bathing?: Yes Independently performs ADLs?: No Communication: Independent Dressing (OT): Needs assistance Is this a change from baseline?: Pre-admission baseline Grooming: Needs assistance Is this a change from baseline?: Pre-admission baseline Feeding: Independent Bathing: Needs assistance Is this a change from baseline?: Pre-admission baseline Toileting: Needs assistance Is this a change from baseline?: Pre-admission baseline In/Out Bed: Dependent Is this a change from baseline?: Pre-admission baseline Walks in Home: Dependent Is this a change from baseline?: Pre-admission baseline Does the patient have difficulty walking or climbing stairs?: Yes Weakness of Legs: Both Weakness of Arms/Hands: None  Permission Sought/Granted Permission sought to share information with : Facility Art therapist granted to share information with : Yes, Verbal Permission Granted (by daughter)     Permission granted to share info w AGENCY: [facility], [facility], DME agency        Emotional Assessment       Orientation: : Fluctuating Orientation (Suspected and/or reported  [place]und[place]) Alcohol / Substance Use: Not Applicable Psych Involvement: No (comment)  Admission diagnosis:  Shortness of breath [R06.02] Slurred speech [R47.81] Patient Active Problem List    Diagnosis Date Noted  . Slurred speech 06/04/2020  . CKD (chronic kidney disease) stage 3, GFR 30-59 ml/min (HCC) 06/04/2020  . Essential hypertension 06/04/2020  . Hyperlipidemia 06/04/2020  . Lewy body dementia ([place]) 05/21/2020  . Hyperlipidemia associated with type 2 diabetes mellitus ([place]) 05/21/2020  . Status post total right knee replacement 01/22/2020  . Anemia of chronic kidney failure, stage 3 (moderate) ([place]) 01/16/2020  . Total knee replacement status 11/02/2019  . History of total knee arthroplasty 11/02/2019  . PAD (peripheral artery disease) ([place]) 10/13/2019  . Benign prostatic hyperplasia   . Seizures ([place])   . Anemia due to stage 4 chronic kidney disease ([place]) 06/24/2019  . Generalized weakness 06/24/2019  . Anemia in chronic kidney disease (CODE) 06/24/2019  . Asthenia 06/24/2019  . Primary osteoarthritis of right knee 04/03/2019  . Ankle ulcer ([place]) 05/08/2018  . Use of cane as ambulatory aid 04/27/2018  . Dependence on other enabling machines and devices 04/27/2018  . Type 2 diabetes mellitus with stage 4 chronic kidney disease, without long-term current use of insulin ([place]) 06/11/2017  . Lymphedema 05/04/2016  . Chronic venous insufficiency 05/04/2016  . Toe sprain 01/24/2016  . Morbid obesity with body mass index of 40.0-44.9 in adult [facility]) 05/24/2014  . Body mass index (BMI)40.0-44.9, adult 05/24/2014  . OSA on CPAP 01/10/2014  . CKD (chronic kidney disease), stage IV ([place]) 11/22/2013  . Allergic rhinitis 11/06/2013  . Benign hypertension with CKD (chronic kidney disease) stage IV ([place]) 11/06/2013  . Epilepsy ([place]) 11/06/2013  . Gout 11/06/2013  . History of DVT (deep vein thrombosis) 11/06/2013  . Chronic deep vein thrombosis (DVT) of proximal vein of lower extremity ([place]) 11/06/2013  . 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 Personal history of other venous thrombosis and  embolism 11/06/2013   PCP:  [name], DO Pharmacy:   [facility] [address] [place] [address] [place]) [place] [address] Phone: [phone] Fax: [phone]  [place] [place], [address] [place], [place] [address] [place] [address] Phone: [phone] Fax: [phone]     Social Determinants of Health (SDOH) Interventions    Readmission Risk Interventions No flowsheet data found.

## 2020-06-05 NOTE — Progress Notes (Signed)
*  PRELIMINARY RESULTS* Echocardiogram 2D Echocardiogram has been performed.  Justin Shannon 06/05/2020, 1:28 PM

## 2020-06-05 NOTE — Progress Notes (Signed)
eeg done °

## 2020-06-05 NOTE — Progress Notes (Signed)
SLP Cancellation Note  Patient Details Name: Justin Shannon. MRN: [id number] DOB: [birth year]   Cancelled treatment:       Reason Eval/Treat Not Completed: Patient at procedure or test/unavailable  Pt out of room for procedure. Will attempt at a later time.   Zenora Karpel B. Rutherford Nail M.S., CCC-SLP, [place] Office [phone]  Stormy Fabian 06/05/2020, 9:46 AM

## 2020-06-05 NOTE — Evaluation (Signed)
Occupational Therapy Evaluation Patient Details Name: Justin Shannon. MRN: [id number] DOB: Oct 27, 1940 Today's Date: 06/05/2020    History of Present Illness 79 y.o. 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  History is supplemented by daughter at bedside.     This morning, patient's daughter was giving him his medications when she noticed that his speech was slurred, significantly changed from baseline.  He appeared awake and alert but his speech was dysarthric.  She did not see any asymmetry of his face.  He has chronic left-sided weakness which did not seem significantly changed from his baseline.  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  His daughter called her neurologist who recommended he come to the ED for stroke evaluation.   Clinical Impression   Patient presenting with decreased I in self care, balance, functional mobility/transfers, endurance, and safety awareness.  Patient reports he lives with daughter and son in law PTA. He says he has been in wheelchair for majority of his day since June 2021. Pt has an aide that assists with self care tasks from bed level multiple times each week. Pt reports being able to stand with RW and use of gait belt. Patient currently needing max A to get to EOB with max A for static sitting balance with manual assistance for anterior weight shift. Pt needing max +2 assistance to stand and only able to take a step with R foot. Total A to weight shift to the R and then pt unable to lift L LE.  Patient will benefit from acute OT to increase overall independence in  the areas of ADLs, functional mobility, and safety awareness in order to safely discharge to next venue of care.    Follow Up Recommendations  SNF    Equipment Recommendations  Other (comment) (defer to next venue of care)       Precautions / Restrictions Precautions Precautions: Fall      Mobility Bed Mobility Overal bed mobility: Needs Assistance Bed Mobility: Rolling;Supine to Sit;Sit to Supine Rolling: Max assist   Supine to sit: Max assist Sit to supine: +2 for safety/equipment;Mod assist;Max assist   General bed mobility comments: posterior bias with pt unable to self correct without assistance. Min cuing for technique and hand placement    Transfers Overall transfer level: Needs assistance Equipment used: 2 person hand held assist Transfers: Sit to/from Stand Sit to Stand: Max assist;+2 physical assistance              Balance Overall balance assessment: Needs assistance Sitting-balance support: Feet supported Sitting balance-Leahy Scale: Poor Sitting balance - Comments: max A for posterior bias on EOB     Standing balance-Leahy Scale: Zero                             ADL either performed or assessed with clinical judgement   ADL Overall ADL's : Needs assistance/impaired Eating/Feeding: Set up;Bed level   Grooming: Oral care;Bed level;Wash/dry hands;Wash/dry face;Set up           Upper Body Dressing : Total assistance;Sitting  Functional mobility during ADLs: Rolling walker;Cueing for safety;Cueing for sequencing;+2 for physical assistance;Maximal assistance General ADL Comments: heavy posterior bias sitting on EOB with +2 assistance needed for standing. Set up A for grooming tasks from bed level. Depedent for toileting needs.     Vision Patient Visual Report: No change from baseline              Pertinent Vitals/Pain Pain Assessment: Faces Faces Pain Scale: No hurt     Hand Dominance Right    Extremity/Trunk Assessment Upper Extremity Assessment Upper Extremity Assessment: Generalized weakness   Lower Extremity Assessment Lower Extremity Assessment: Defer to PT evaluation       Communication Communication Communication: No difficulties   Cognition Arousal/Alertness: Awake/alert   Overall Cognitive Status: No family/caregiver present to determine baseline cognitive functioning                                 General Comments: Pt with slow processing but A & O x4 this session.   General Comments               Home Living Family/patient expects to be discharged to:: Private residence Living Arrangements: Children Available Help at Discharge: Family;Available PRN/intermittently Type of Home: House Home Access: Ramped entrance           Bathroom Shower/Tub: Walk-in shower   Bathroom Toilet: Standard Bathroom Accessibility: No   Home Equipment: Environmental consultant - 2 wheels;Shower seat;Bedside commode;Cane - single point;Hospital bed;Other (comment)   Additional Comments: Pt also has lift chair. Pt reports daughter works from home.      Prior Functioning/Environment Level of Independence: Needs assistance  Gait / Transfers Assistance Needed: Pt reports he does stand with family assist with RW and transfers into wheelchair. ADL's / Homemaking Assistance Needed: Pt has aide that comes several times a week to help with self care tasks and bathing.            OT Problem List: Decreased strength;Decreased coordination;Decreased range of motion;Decreased cognition;Decreased activity tolerance;Decreased safety awareness;Impaired balance (sitting and/or standing);Decreased knowledge of precautions;Decreased knowledge of use of DME or AE;Cardiopulmonary status limiting activity      OT Treatment/Interventions: Self-care/ADL training;Therapeutic exercise;Therapeutic activities;Energy conservation;DME and/or AE instruction;Patient/family education;Balance  training;Neuromuscular education;Cognitive remediation/compensation    OT Goals(Current goals can be found in the care plan section) Acute Rehab OT Goals Patient Stated Goal: to go home OT Goal Formulation: With patient Time For Goal Achievement: 06/19/20 Potential to Achieve Goals: Good ADL Goals Pt Will Perform Grooming: with min assist;sitting Pt Will Transfer to Toilet: with mod assist;ambulating Pt Will Perform Toileting - Clothing Manipulation and hygiene: with mod assist;sit to/from stand  OT Frequency: Min 1X/week   Barriers to D/C:    none known at this time          AM-PAC OT "6 Clicks" Daily Activity     Outcome Measure Help from another person eating meals?: A Little Help from another person taking care of personal grooming?: A Little Help from another person toileting, which includes using toliet, bedpan, or urinal?: Total Help from another person bathing (including washing, rinsing, drying)?: A Lot Help from another person to put on and taking off regular upper body clothing?: A Lot Help from another person to put on and taking off regular lower body clothing?: Total 6 Click Score: 12   End of Session Equipment Utilized During Treatment: Rolling walker Nurse Communication: Mobility status  Activity  Tolerance: Patient limited by fatigue Patient left: in bed;with call bell/phone within reach;with bed alarm set  OT Visit Diagnosis: Unsteadiness on feet (R26.81);Muscle weakness (generalized) (M62.81)                Time: 7505-1833 OT Time Calculation (min): 21 min Charges:  OT General Charges $OT Visit: 1 Visit OT Evaluation $OT Eval Moderate Complexity: 1 Mod OT Treatments $Therapeutic Activity: 8-22 mins  Darleen Crocker, MS, OTR/L , CBIS ascom [phone]  06/05/20, 1:01 PM

## 2020-06-05 NOTE — Progress Notes (Signed)
PT Cancellation Note  Patient Details Name: Ruairi Stutsman. MRN: [id number] DOB: October 18, 1940   Cancelled Treatment:    Reason Eval/Treat Not Completed: Other (comment).  PT consult received.  Chart reviewed.  Therapist arrived to pt's room at about 1500.  With pt's permission, therapist spoke with pt's daughter regarding PLOF and home set-up.  Pt's daughter [name] reports that pt does not have R LE hinged knee brace at hospital but normally wears it all the time except when sleeping (per ortho note 04/10/20 pt WBAT to R LE with brace locked in extension; plan for follow-up in 3 months).  Pt's daughter cleared to come to participate in therapy session per discussion with charge nurse (to assist with determining if daughter able to provide required assist (and felt comfortable with required assist) for safe home discharge).  Therapist attempted to return to see pt around 1600 but pt not in room (gone for EEG) and also not in room at 1630 (pt's daughter present both times and had pt's R LE brace and walker).  Unable to initiate PT evaluation d/t pt still gone for EEG (charge nurse notified).  Will re-attempt PT evaluation at a later date/time as able.  Leitha Bleak, PT 06/05/20, 4:45 PM

## 2020-06-05 NOTE — Progress Notes (Signed)
PT Cancellation Note  Patient Details Name: Justin Shannon. MRN: [id number] DOB: [birth year]   Cancelled Treatment:    Reason Eval/Treat Not Completed: Patient out of room at procedure or test and unavailable for PT evaluation, will attempt to see pt at a future date/time as medically appropriate.     Linus Salmons PT, DPT 06/05/20, 4:53 PM

## 2020-06-05 NOTE — Care Management Obs Status (Signed)
[facility] NOTIFICATION   Patient Details  Name: Justin Shannon. MRN: [id number] Date of Birth: [birth year]   Medicare Observation Status Notification Given:  Yes  Reviewed with daughter and wife via phone due to patient being disoriented per chart.  [place], LCSW 06/05/2020, 4:08 PM

## 2020-06-05 NOTE — Consult Note (Signed)
Neurology Consultation  Reason for Consult: Transient episode of slurred speech, hallucinations, altered mental status, worsening left-sided weakness, concern for seizures Referring Physician: Dr. Benny Lennert  CC: Transient episode of slurred speech  History is obtained from: [name] daughter over the phone, chart  HPI: Justin Shannon. is a 79 y.o. male past medical history of cognitive decline deemed to be dementia with Lewy body, associated with behavioral changes and RBD, gradually progressive decline in ability to take care of himself, right lower extremity DVT on anticoagulation, CKD, history of seizures on Keppra with recent addition of gabapentin and Lamictal due to increasing frequency of episodes concerning for seizures, brought in for evaluation to the emergency room for multiple ongoing issues but the most recent being an episode of slurred speech that lasted about 1 to 2 hours. According to the daughter, he was in his usual state of health-at baseline requiring 24/7 help with home health and family, when they noted yesterday that he had an episode of slurred speech that he woke up in the morning of 06/04/2020 and speech was slurred and remained slurred for about 1 to 2 hours.  His words were incoherent to the family.  He follows at [facility] (Dr. Manuella Ghazi and Gayland Curry, [place]) and the family called the office and was advised to bring him to the emergency room for evaluation of the slurred speech. He has a pretty extensive history of ongoing cognitive decline.  For the last 7 to 8 years, his cognition has been declining.  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  He has been diagnosed with Lewy body dementia by Dr. Manuella Ghazi.  He was started on Rivastigmine and following that started having a lot of hallucinations, and the rivastigmine was stopped.  He was started a few years ago on Keppra for episodes that were concerning for focal seizures where he would have  lipsmacking/tongue clicking, staring episodes and ensuing urinary incontinence.  Before these episodes, he would have some sort of an aura which would make him aware that this episode was coming.  He would be unresponsive during the episode and would come around after little while concerning for postictal phase. He was on Keppra, gabapentin was added-100 3 times daily and additional 300 mg at night which he was tolerating fine but continued to have episodes concerning for seizure-like activity. Family also reported some left-sided weakness. He has been evaluated level of the C-spine and has some C-spine disease with radiculopathy but he is not deemed to be a surgical candidate.  I was able to briefly speak with Dr. Manuella Ghazi regarding this patient. He has been following him for a while. I discussed my plan with him as well-listed below. The patient's family has been depressed about trying Seroquel as well as Nuplazid and have also been approached about considering a memory care unit placement for the patient due to the fluctuating nature of symptoms associated with Lewy body dementia but thus far the family has been reluctant to pursue the memory care unit approach.    ROS: Patient unable to reliably provide.  Past Medical History:  Diagnosis Date  . Arthritis   . Bilateral swelling of feet   . BPH (benign prostatic hyperplasia)   . Chronic kidney disease   . DVT (deep venous thrombosis) (HCC)    right lower extremity  . History of gout   . Seizures ([place])   . Sleep apnea   . Tendonitis of foot    left  mRS 5-Severe disability-bedridden, incontinent, needs constant attention    Family History  Problem Relation Age of Onset  . Arthritis Mother   . Hypertension Mother   . Diabetes Brother     Social History:   reports that he quit smoking about 51 years ago. His smoking use included cigarettes. He has a 2.00 pack-year smoking history. He has never used smokeless tobacco. He reports that  he does not drink alcohol and does not use drugs.  Medications  Current Facility-Administered Medications:  .   stroke: mapping our early stages of recovery book, , Does not apply, Once, Lenore Cordia, MD .  acetaminophen (TYLENOL) tablet 650 mg, 650 mg, Oral, Q4H PRN **OR** acetaminophen (TYLENOL) 160 MG/5ML solution 650 mg, 650 mg, Per Tube, Q4H PRN **OR** acetaminophen (TYLENOL) suppository 650 mg, 650 mg, Rectal, Q4H PRN, Patel, Vishal R, MD .  albuterol (VENTOLIN HFA) 108 (90 Base) MCG/ACT inhaler 2 puff, 2 puff, Inhalation, Q4H PRN, Zada Finders R, MD .  allopurinol (ZYLOPRIM) tablet 100 mg, 100 mg, Oral, Daily, Zada Finders R, MD, 100 mg at 06/05/20 0801 .  atorvastatin (LIPITOR) tablet 20 mg, 20 mg, Oral, QHS, Zada Finders R, MD, 20 mg at 06/04/20 2248 .  docusate sodium (COLACE) capsule 100 mg, 100 mg, Oral, Daily PRN, Zada Finders R, MD .  gabapentin (NEURONTIN) capsule 100 mg, 100 mg, Oral, TID, Zada Finders R, MD, 100 mg at 06/05/20 1109 .  gabapentin (NEURONTIN) capsule 300 mg, 300 mg, Oral, QHS, Zada Finders R, MD, 300 mg at 06/04/20 2248 .  lamoTRIgine (LAMICTAL) tablet 25 mg, 25 mg, Oral, QHS, Zada Finders R, MD, 25 mg at 06/04/20 2248 .  levETIRAcetam (KEPPRA) tablet 500 mg, 500 mg, Oral, BID, Zada Finders R, MD, 500 mg at 06/05/20 0802 .  melatonin tablet 5 mg, 5 mg, Oral, QHS PRN, Lenore Cordia, MD, 5 mg at 06/04/20 2249 .  mometasone-formoterol (DULERA) 200-5 MCG/ACT inhaler 2 puff, 2 puff, Inhalation, BID, Lenore Cordia, MD, 2 puff at 06/05/20 0803 .  rivaroxaban (XARELTO) tablet 10 mg, 10 mg, Oral, QAC breakfast, Zada Finders R, MD, 10 mg at 06/05/20 0801 .  senna-docusate (Senokot-S) tablet 1 tablet, 1 tablet, Oral, QHS PRN, Lenore Cordia, MD  Exam: Current vital signs: BP (!) 126/56 (BP Location: Left Arm)   Pulse 64   Temp (!) 96.4 F (35.8 C) (Axillary)   Resp 16   Ht 5\' 10"  (1.778 m)   Wt 134.6 kg   SpO2 100%   BMI 42.59 kg/m  Vital signs in  last 24 hours: Temp:  [96.4 F (35.8 C)-98.9 F (37.2 C)] 96.4 F (35.8 C) (12/14 1129) Pulse Rate:  [57-92] 64 (12/14 1129) Resp:  [15-25] 16 (12/14 1129) BP: (126-158)/(56-87) 126/56 (12/14 1129) SpO2:  [98 %-100 %] 100 % (12/14 1129) Weight:  [134.6 kg] 134.6 kg (12/13 1245) : Awake alert in no distress HEENT: Cephalic atraumatic Lungs: Clear Cardiovascular: Regular rate rhythm Abdomen nondistended nontender Extremities warm well perfused Neurological exam Patient is awake alert oriented to self. Upon asking him where he is, he says he is in [facility]. Upon telling him that he is in [facility] at [facility], he went on a tangent saying from work he can think, he is in [place]. He has extremely poor attention concentration. Was able to name couple simple objects. Did not repeat a sentence for me to its full length. No dysarthria Cranial nerves II to XII intact  with the exception of hypomimia  Motor exam: Antigravity both upper extremities with paratonia on examination. Somewhat subtle weakness in the left upper extremity in comparison to the right on individual muscle testing at the shoulder and the elbow.  Both lower extremity hip flexors are barely 3/5. Bradykinesia worse on left comparison of the right. Sensory exam: Intact to light touch Coordination: Mild asymmetric resting tremor affecting the left upper extremity. Noted appreciated tremor in the right upper extremity at rest. No gross dysmetria. Gait can not be tested  Labs I have reviewed labs in epic and the results pertinent to this consultation are: CBC    Component Value Date/Time   WBC 5.2 06/04/2020 1323   RBC 4.19 (L) 06/04/2020 1323   HGB 9.5 (L) 06/04/2020 1323   HGB 12.6 (L) 10/23/2012 0519   HCT 30.2 (L) 06/04/2020 1323   HCT 39.4 (L) 10/23/2012 0519   PLT 184 06/04/2020 1323   PLT 174 10/23/2012 0519   MCV 72.1 (L) 06/04/2020 1323   MCV 72 (L) 10/23/2012 0519    MCH 22.7 (L) 06/04/2020 1323   MCHC 31.5 06/04/2020 1323   RDW 19.6 (H) 06/04/2020 1323   RDW 18.1 (H) 10/23/2012 0519   LYMPHSABS 1.4 06/04/2020 1323   LYMPHSABS 1.6 10/23/2012 0519   MONOABS 0.7 06/04/2020 1323   MONOABS 0.5 10/23/2012 0519   EOSABS 0.2 06/04/2020 1323   EOSABS 0.1 10/23/2012 0519   BASOSABS 0.0 06/04/2020 1323   BASOSABS 0.0 10/23/2012 0519  CMP     Component Value Date/Time   NA 140 06/04/2020 1323   NA 140 10/23/2012 0519   K 4.9 06/04/2020 1323   K 4.4 10/23/2012 0519   CL 109 06/04/2020 1323   CL 109 (H) 10/23/2012 0519   CO2 24 06/04/2020 1323   CO2 24 10/23/2012 0519   GLUCOSE 86 06/04/2020 1323   GLUCOSE 144 (H) 10/23/2012 0519   BUN 45 (H) 06/04/2020 1323   BUN 26 (H) 10/23/2012 0519   CREATININE 1.79 (H) 06/04/2020 1323   CREATININE 1.73 (H) 10/23/2012 0519   CALCIUM 9.4 06/04/2020 1323   CALCIUM 9.6 10/23/2012 0519   PROT 7.3 06/04/2020 1323   PROT 7.9 09/02/2011 1539   ALBUMIN 3.5 06/04/2020 1323   ALBUMIN 3.7 09/02/2011 1539   AST 25 06/04/2020 1323   AST 46 (H) 09/02/2011 1539   ALT 17 06/04/2020 1323   ALT 53 09/02/2011 1539   ALKPHOS 101 06/04/2020 1323   ALKPHOS 74 09/02/2011 1539   BILITOT 0.5 06/04/2020 1323   BILITOT 0.4 09/02/2011 1539   GFRNONAA 38 (L) 06/04/2020 1323   GFRNONAA 39 (L) 10/23/2012 0519   GFRAA 36 (L) 12/07/2019 1652   GFRAA 45 (L) 10/23/2012 0519   Lipid Panel     Component Value Date/Time   CHOL 96 06/05/2020 0816   TRIG 81 06/05/2020 0816   HDL 57 06/05/2020 0816   CHOLHDL 1.7 06/05/2020 0816   VLDL 16 06/05/2020 0816   LDLCALC 23 06/05/2020 0816   Imaging I have reviewed the images obtained: MRI examination of the brain-no acute intracranial abnormality. No infarct. Chronic microvascular ischemic disease along with remote bilateral cerebellar lacunar infarcts and diffuse cortical atrophy seen.  Assessment:  79 year old man with a progressively worsening course of Lewy body dementia complicated  by hallucinations and episodes concerning for seizures, being treated with Keppra and recent addition of Lamictal as well as gabapentin brought in for a 1 to 2-hour episode of slurred speech. Please see my  detailed HPI above. I also reviewed charts from his outpatient neurologist and briefly discussed with the outpatient neurologist over the phone. His frequency of episodes of hallucinations as well as episodes concerning for seizures is increased considerably over the past few months. His gabapentin was recently increased, Lamictal was added a few days ago. I am not really sure if everything that we are seeing today are a result of an acute process but I think this is all related to progression of the neurodegeneration which has been ongoing. From a more acute perspective, MRI of the brain was negative for acute stroke. I will obtain a routine EEG and recommend some changes to the medications as below. He will require continuing outpatient care and more so behavioral neurology/memory disorder unit placement due to his ongoing fluctuating clinical course, which typically always shows a downward trend and less times of improvement in between.  Impression: Lewy body  Dementia REM sleep behavioral disorder Fluctuating symptoms secondary to Lewy body dementia Doubt this is a TIA History of seizures-increasing frequency of episodes concerning for seizures.  Recommendations: -Continue to maintain seizure precautions -Routine EEG -I would recommend reducing gabapentin-doing only 100 3 times daily. I would skip the 300 mg nightly dose of gabapentin. Gabapentin could have exacerbated sedating effects as well as sometimes even myoclonic jerking-like effects in patients with pre-existing renal derangement. -I would continue the lamotrigine as advised by the outpatient neurologist -I would also start Seroquel 12.5 mg twice daily. -I would continue the [place] at home dose of 500 mg twice daily -I would  continue anticoagulation for DVT treatment -Consider LTM EEG as an outpatient if he continues to have increasing frequency of these episodes concerning for seizures.  Discussed my plan with the daughter at bedside. Relayed my plan to the primary hospitalist over secure chat Also had a brief discussion with the outpatient neurologist over the phone. I will follow up with you.  -- Amie Portland, MD [facility] Pager: [phone] If 7pm to 7am, please call on call as listed on AMION.

## 2020-06-05 NOTE — Progress Notes (Signed)
PROGRESS NOTE  [facility]. GQQ:[id number] DOB: 1940/12/15 DOA: 06/04/2020 PCP: [name], DO  Brief History   Justin Amos. is a 79 y.o. 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  History is supplemented by daughter at bedside.  This morning, patient's daughter was giving him his medications when she noticed that his speech was slurred, significantly changed from baseline.  He appeared awake and alert but his speech was dysarthric.  She did not see any asymmetry of his face.  He has chronic left-sided weakness which did not seem significantly changed from his baseline.  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  His daughter called her neurologist who recommended he come to the ED for stroke evaluation.  Symptoms lasted approximately 2 hours before resolving on their own and at time of admission patient appears to be near his baseline.  Daughter states that patient was hospitalized for COVID-19 pneumonia 06/24/2019 at which time he was treated with remdesivir and steroids.  Since then he has had breathing issues and required Advair and rescue albuterol at home.  He has a chronic nonproductive cough and intermittent shortness of breath.  Recently he has been followed by his neurologist for evaluation of suspected focal partial seizures described as a "trancelike" gaze with rhythmic tremor of his left foot.  He has been taking Keppra for many years but is now 79 has now started him on gabapentin with plan to start and titrate up lamotrigine over an 11-week period with eventual taper  off of Keppra.  Daughter states that he has been having increased tremors in his hands, right greater than left, slow movement, and visual hallucinations (more notable at night).  Patient also states that his short-term memory is impaired.  His neurologist is concerned for Lewy body dementia  Currently patient denies any recent chest pain, palpitations, subjective fevers, chills, diaphoresis, abdominal pain, dysuria.  He has chronic swelling of both of his legs.  He is largely wheelchair-bound.  On occasion will use a walker but requires maximum assistance per daughter.  [facility] were consulted to admit the patient for further evaluation and care. Neurology was consulted. The patient was admitted to a telemetry bed.  CT head was performed which was negative for acute intracranial abnormality. There is mild cerebral atrophy and chronic small vessel ischmic disease which was stable when compared to 05/23/2020 Ct head. There was also a small chronic left cerebellar infarct present that was also seen on a prior image.  MRI Brain: There is no evidence of acute intracranial abnormality or infarct. The study was motion limited. There was evidence of chronic microvascular ischemic disease, remote bilateral cerebellar lacunar infarcts, and diffuse cerebral atrophy.  Bilateral carotic ultrasound demonstrated no significant plaque or stenosis of the right carotid arteries. It did demonstrate a small amound of plaque at the left carotid bulb without significant stenosis in the left internal carotid artery. Bilateral vertebral arteries are patent with antegrade flow.  Echocardiogram: EF greater than 55% with normal left ventricle function. There is grade II diastolic dysfunction. There is elevated left atrial pressure. Although RV function could not be well visualized, the size  was mildly enlarged.The interatrial septum was not well visualized.  EEG pending.  Dr. Rory Percy states that he feels that the  symptoms for which the patient was admitted can primarily be attributed to progression of the neurodegenerative process of which the patient suffers, Lewy Body Dementia. He doubts that TIA/CVA is playing a role.   From here he has recommended a EEG, reducing dose of gabapentin, and skipping the nightly 300 mg dose of gabapentin. He recommended continuing the lamotrigine as previously prescribed by the patient's outpatient neurologist. Seroquel 12.5 mg bid was started. He recommended continuing the bid dosing of 500 mg of Keppra. He also recommended possible outpatient LTM EEG if the patient has increasing episodes that are concerning for seizures.   Consultants  . Neurology  Procedures  . EEG  Antibiotics   Anti-infectives (From admission, onward)   None    .  Subjective  The patient is resting quietly. No new complaints.  Objective   Vitals:  Vitals:   06/05/20 0743 06/05/20 1129  BP: 131/64 (!) 126/56  Pulse: 67 64  Resp: 18 16  Temp: (!) 97.4 F (36.3 C) (!) 96.4 F (35.8 C)  SpO2: 100% 100%   Exam:  Constitutional:  . The patient is awake, alert, and oriented to self. No acute distress. Respiratory:  . No increased work of breathing. . No wheezes, rales, or rhonchi . No tactile fremitus Cardiovascular:  . Regular rate and rhythm . No murmurs, ectopy, or gallups. . No lateral PMI. No thrills. Abdomen:  . Abdomen is soft, non-tender, non-distended . No hernias, masses, or organomegaly . Normoactive bowel sounds.  Musculoskeletal:  . No cyanosis, clubbing, or edema Skin:  . No rashes, lesions, ulcers . palpation of skin: no induration or nodules Neurologic:  . Resting tremor of bilateral upper extremities. Psychiatric:  . Unable to evaluate as patient is unable to cooperate with exam.  I have personally reviewed the following:   Today's Data  . Vitals, CMP, Lipid profile, CBC  Imaging  . CT head . MRI Brain  Cardiology Data   . Echocardiogram  Scheduled Meds: .  stroke: mapping our early stages of recovery book   Does not apply Once  . allopurinol  100 mg Oral Daily  . atorvastatin  20 mg Oral QHS  . gabapentin  100 mg Oral TID  . lamoTRIgine  25 mg Oral QHS  . levETIRAcetam  500 mg Oral BID  . mometasone-formoterol  2 puff Inhalation BID  . QUEtiapine  12.5 mg Oral BID  . rivaroxaban  10 mg Oral QAC breakfast   Continuous Infusions:  Principal Problem:   Slurred speech Active Problems:   History of DVT (deep vein thrombosis)   OSA on CPAP   Seizures (HCC)   Anemia of chronic kidney failure, stage 3 (moderate) (HCC)   Lewy body dementia (HCC)   CKD (chronic kidney disease) stage 3, GFR 30-59 ml/min (HCC)   Essential hypertension   Hyperlipidemia   LOS: 0 days    A & P  Slurred speech/Seizure activity/Hallucinations: TIA/CVA work up is negative. Per neurology this most likely represents a progression of the patient's Lewy Body dementia. He has recommended From here he has recommended a EEG, reducing dose of gabapentin, and skipping the nightly 300 mg dose of gabapentin. He recommended continuing the lamotrigine as previously prescribed by the patient's outpatient neurologist. Seroquel 12.5 mg bid was started. He recommended continuing the bid dosing of 500 mg of Keppra. He also recommended possible  outpatient LTM EEG if the patient has increasing episodes that are concerning for seizures. EEG is pending as is PT/OT/SLP evaluation. Lipid panel demonstrated a LDL of 23. Hemolgobin A1c was 5.9. I gretly appreciate Dr. Johny Chess assistance.  Seizure disorder: EEG is pending. Dr. Rory Percy has recommended continuation of lamotrigine and keppra. He has also recommended LTM EEG as outpatient if patient continues to have episodes that are suspicios for seizure activity. He has recommended decreasing the dose of gabapentin as this may play a role in the patient's slurred speech and occasional myoclonic jerks.    History of RLE DVT: Noted. The patient will be continued on Xarelto. Continue Xarelto.  Also has IVC filter which was placed in May 2021 due to time off anticoagulation for orthopedic surgical procedures of right knee.  CKD stage IIIb: Noted. Monitored creatinine, electrolytes, and volume status. Avoid nephrotoxic substances and hypotension. Renal function stable with improved from recent baseline creatinine ~2.0.    Anemia of chronic disease: Chronic and appears stable without obvious bleeding.  Continue to monitor.  Hypertension: Holding home metoprolol to allow for permissive hypertension for now.  Hyperlipidemia: Continue atorvastatin.  History of COVID-19 pneumonia: Hospitalized January 2021 with residual chronic respiratory symptoms.  Upper airway wheezing on admission.  Check chest x-ray.  Continue Advair and as needed albuterol.  Lewy body dementia: Noted. Dr. Rory Percy has discussed the patient with his outpatient neurologist at length. Continue Keppra and lamotrigine.  Delirium and fall precautions.  OSA: Continue CPAP nightly.  I have seen and examined this patient myself. I have spent 36 minutes in his evaluation and care.  DVT prophylaxis: Xarelto Code Status: Full code, confirmed with patient Family Communication: Discussed with patient's daughter at bedside Disposition Plan: From home-lives with daughter, dispo pending TIA work-up and PT/OT eval   Delaila Nand, DO [facility] Direct contact: see [email or web address]  7PM-7AM contact night coverage as above 06/05/2020, 6:23 PM  LOS: 0 days

## 2020-06-06 DIAGNOSIS — R569 Unspecified convulsions: Secondary | ICD-10-CM

## 2020-06-06 DIAGNOSIS — G3183 Dementia with Lewy bodies: Secondary | ICD-10-CM | POA: Diagnosis not present

## 2020-06-06 DIAGNOSIS — R4781 Slurred speech: Secondary | ICD-10-CM | POA: Diagnosis not present

## 2020-06-06 DIAGNOSIS — F0281 Dementia in other diseases classified elsewhere with behavioral disturbance: Secondary | ICD-10-CM | POA: Diagnosis not present

## 2020-06-06 LAB — BASIC METABOLIC PANEL
Anion gap: 5 (ref 5–15)
BUN: 47 mg/dL — ABNORMAL HIGH (ref 8–23)
CO2: 24 mmol/L (ref 22–32)
Calcium: 9.1 mg/dL (ref 8.9–10.3)
Chloride: 111 mmol/L (ref 98–111)
Creatinine, Ser: 1.94 mg/dL — ABNORMAL HIGH (ref 0.61–1.24)
GFR, Estimated: 35 mL/min — ABNORMAL LOW (ref >60)
Glucose, Bld: 92 mg/dL (ref 70–99)
Potassium: 5 mmol/L (ref 3.5–5.1)
Sodium: 140 mmol/L (ref 135–145)

## 2020-06-06 LAB — COMPREHENSIVE METABOLIC PANEL
ALT: 15 U/L (ref 0–44)
AST: 22 U/L (ref 15–41)
Albumin: 3.2 g/dL — ABNORMAL LOW (ref 3.5–5.0)
Alkaline Phosphatase: 84 U/L (ref 38–126)
Anion gap: 4 — ABNORMAL LOW (ref 5–15)
BUN: 44 mg/dL — ABNORMAL HIGH (ref 8–23)
CO2: 25 mmol/L (ref 22–32)
Calcium: 9.1 mg/dL (ref 8.9–10.3)
Chloride: 112 mmol/L — ABNORMAL HIGH (ref 98–111)
Creatinine, Ser: 1.86 mg/dL — ABNORMAL HIGH (ref 0.61–1.24)
GFR, Estimated: 37 mL/min — ABNORMAL LOW (ref >60)
Glucose, Bld: 95 mg/dL (ref 70–99)
Potassium: 5.2 mmol/L — ABNORMAL HIGH (ref 3.5–5.1)
Sodium: 141 mmol/L (ref 135–145)
Total Bilirubin: 0.4 mg/dL (ref 0.3–1.2)
Total Protein: 6.7 g/dL (ref 6.5–8.1)

## 2020-06-06 MED ORDER — QUETIAPINE FUMARATE 25 MG PO TABS: 12.5000 mg | ORAL_TABLET | Freq: Two times a day (BID) | ORAL | 0 refills | Status: DC

## 2020-06-06 MED ORDER — SODIUM CHLORIDE 0.9 % IV SOLN: INTRAVENOUS | Status: DC

## 2020-06-06 MED ORDER — GABAPENTIN 100 MG PO CAPS: 100.0000 mg | ORAL_CAPSULE | Freq: Two times a day (BID) | ORAL | Status: DC

## 2020-06-06 MED ORDER — LAMOTRIGINE 25 MG PO TABS: ORAL_TABLET | ORAL | Status: DC

## 2020-06-06 NOTE — Procedures (Signed)
Patient Name: [name]  MRN: [id number]  Epilepsy Attending: Lora Havens  Referring Physician/Provider: Dr Amie Portland Date: 06/05/2020  Duration:  31.10 mins  Patient history: 79 year old man with a progressively worsening course of Lewy body dementia complicated by hallucinations and episodes concerning for seizures, being treated with Keppra and recent addition of Lamictal as well as gabapentin brought in for a 1 to 2-hour episode of slurred speech. EEG to evaluate for seizure  Level of alertness: Awake, asleep  AEDs during EEG study: GBP, LTG, LEV  Technical aspects: This EEG study was done with scalp electrodes positioned according to the 10-20 International system of electrode placement. Electrical activity was acquired at a sampling rate of 500Hz  and reviewed with a high frequency filter of 70Hz  and a low frequency filter of 1Hz . EEG data were recorded continuously and digitally stored.   Description: No posterior dominant rhythm was seen. Sleep was characterized by vertex waves, sleep spindles (12 to 14 Hz), maximal frontocentral region.   EEG showed continuous generalized 5 to 7 Hz theta-delta slowing. Physiologic photic driving was not seen during photic stimulation.  Hyperventilation was not performed.     ABNORMALITY -Continuous slow, generalized  IMPRESSION: This study is suggestive of mild to moderate diffuse encephalopathy, nonspecific etiology. No seizures or epileptiform discharges were seen throughout the recording.  Justin Shannon Barbra Sarks

## 2020-06-06 NOTE — NC FL2 (Signed)
[place] LEVEL OF CARE SCREENING TOOL     IDENTIFICATION  Patient Name: Justin Shannon. Birthdate: [birth year] Sex: male Admission Date (Current Location): 06/04/2020  [place] and [place] Number:  Engineering geologist and Address:  [facility], [address], [place], [place] [address]      Provider Number: [id number]  Attending Physician Name and Address:  Antonieta Pert, MD  Relative Name and Phone Number:  Kaiden, Pech (Daughter)   [phone] [facility])    Current Level of Care: Hospital Recommended Level of Care: [place] Prior Approval Number:    Date Approved/Denied:   PASRR Number:    Discharge Plan: SNF    Current Diagnoses: Patient Active Problem List   Diagnosis Date Noted  . Slurred speech 06/04/2020  . CKD (chronic kidney disease) stage 3, GFR 30-59 ml/min (HCC) 06/04/2020  . Essential hypertension 06/04/2020  . Hyperlipidemia 06/04/2020  . Lewy body dementia ([place]) 05/21/2020  . Hyperlipidemia associated with type 2 diabetes mellitus ([place]) 05/21/2020  . Status post total right knee replacement 01/22/2020  . Anemia of chronic kidney failure, stage 3 (moderate) ([place]) 01/16/2020  . Total knee replacement status 11/02/2019  . History of total knee arthroplasty 11/02/2019  . PAD (peripheral artery disease) ([place]) 10/13/2019  . Benign prostatic hyperplasia   . Seizures ([place])   . Anemia due to stage 4 chronic kidney disease ([place]) 06/24/2019  . Generalized weakness 06/24/2019  . Anemia in chronic kidney disease ([place]) 06/24/2019  . Asthenia 06/24/2019  . Primary osteoarthritis of right knee 04/03/2019  . Ankle ulcer ([place]) 05/08/2018  . Use of cane as ambulatory aid 04/27/2018  . Dependence on other enabling machines and devices 04/27/2018  . Type 2 diabetes mellitus with stage 4 chronic kidney disease, without long-term current use of insulin ([place]) 06/11/2017  . Lymphedema 05/04/2016  . Chronic venous  insufficiency 05/04/2016  . Toe sprain 01/24/2016  . Morbid obesity with body mass index of 40.0-44.9 in adult [facility]) 05/24/2014  . Body mass index (BMI)40.0-44.9, adult 05/24/2014  . OSA on CPAP 01/10/2014  . CKD (chronic kidney disease), stage IV ([place]) 11/22/2013  . Allergic rhinitis 11/06/2013  . Benign hypertension with CKD (chronic kidney disease) stage IV ([place]) 11/06/2013  . Epilepsy ([place]) 11/06/2013  . Gout 11/06/2013  . History of DVT (deep vein thrombosis) 11/06/2013  . Chronic deep vein thrombosis (DVT) of proximal vein of lower extremity ([place]) 11/06/2013  . 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 ([place]) 11/06/2013  . Personal history of other venous thrombosis and embolism 11/06/2013    Orientation RESPIRATION BLADDER Height & Weight     Self,Place  Normal Incontinent Weight: 296 lb 12.8 oz (134.6 kg) Height:  5\' 10"  (177.8 cm)  BEHAVIORAL SYMPTOMS/MOOD NEUROLOGICAL BOWEL NUTRITION STATUS     (epilepsy, lewy body dementia)   Diet (regular)  AMBULATORY STATUS COMMUNICATION OF NEEDS Skin   Extensive Assist Verbally  (right small ulceration right lower leg)                       Personal Care Assistance Level of Assistance  Bathing,Feeding,Dressing Bathing Assistance: Maximum assistance Feeding assistance: Limited assistance Dressing Assistance: Maximum assistance     Functional Limitations Info             SPECIAL CARE FACTORS FREQUENCY  PT (By licensed PT),OT (By licensed OT)     PT Frequency: 5 x/week OT Frequency: 5 x/week  Contractures      Additional Factors Info  Code Status,Allergies Code Status Info: full code Allergies Info: shellfish, iodinated diagnostic agents, nsaids, rivastigmine           Current Medications (06/06/2020):  This is the current hospital active medication list Current Facility-Administered Medications  Medication Dose Route Frequency Provider  Last Rate Last Admin  .  stroke: mapping our early stages of recovery book   Does not apply Once Zada Finders R, MD      . 0.9 %  sodium chloride infusion   Intravenous Continuous Antonieta Pert, MD 100 mL/hr at 06/06/20 0845 New Bag at 06/06/20 0845  . acetaminophen (TYLENOL) tablet 650 mg  650 mg Oral Q4H PRN Lenore Cordia, MD       Or  . acetaminophen (TYLENOL) 160 MG/5ML solution 650 mg  650 mg Per Tube Q4H PRN Lenore Cordia, MD       Or  . acetaminophen (TYLENOL) suppository 650 mg  650 mg Rectal Q4H PRN Zada Finders R, MD      . albuterol (VENTOLIN HFA) 108 (90 Base) MCG/ACT inhaler 2 puff  2 puff Inhalation Q4H PRN Zada Finders R, MD      . allopurinol (ZYLOPRIM) tablet 100 mg  100 mg Oral Daily Zada Finders R, MD   100 mg at 06/06/20 0834  . atorvastatin (LIPITOR) tablet 20 mg  20 mg Oral QHS Lenore Cordia, MD   20 mg at 06/05/20 2221  . docusate sodium (COLACE) capsule 100 mg  100 mg Oral Daily PRN Zada Finders R, MD      . fluticasone (FLONASE) 50 MCG/ACT nasal spray 2 spray  2 spray Each Nare Daily PRN Swayze, Ava, DO      . gabapentin (NEURONTIN) capsule 100 mg  100 mg Oral TID Lenore Cordia, MD   100 mg at 06/06/20 1610  . lamoTRIgine (LAMICTAL) tablet 25 mg  25 mg Oral QHS Lenore Cordia, MD   25 mg at 06/05/20 2223  . levETIRAcetam (KEPPRA) tablet 500 mg  500 mg Oral BID Zada Finders R, MD   500 mg at 06/05/20 2223  . melatonin tablet 5 mg  5 mg Oral QHS PRN Lenore Cordia, MD   5 mg at 06/04/20 2249  . mometasone-formoterol (DULERA) 200-5 MCG/ACT inhaler 2 puff  2 puff Inhalation BID Lenore Cordia, MD   2 puff at 06/06/20 0835  . QUEtiapine (SEROQUEL) tablet 12.5 mg  12.5 mg Oral BID Amie Portland, MD   12.5 mg at 06/06/20 0834  . rivaroxaban (XARELTO) tablet 10 mg  10 mg Oral QAC breakfast Lenore Cordia, MD   10 mg at 06/06/20 0834  . senna-docusate (Senokot-S) tablet 1 tablet  1 tablet Oral QHS PRN Lenore Cordia, MD         Discharge Medications: Please  see discharge summary for a list of discharge medications.  Relevant Imaging Results:  Relevant Lab Results:   Additional Information SS #: [id number]  [place], LCSW

## 2020-06-06 NOTE — Progress Notes (Signed)
SLP Cancellation Note  Patient Details Name: Justin Shannon. MRN: [id number] DOB: May 04, 1941   Cancelled treatment:       Reason Eval/Treat Not Completed: SLP screened, no needs identified, will sign off  Pt appears at baseline as reported in chart. No acute deficits identified, no acute neurological deficits on imaging.   Ngai Parcell B. Rutherford Nail M.S., CCC-SLP, [place] Office [phone]   Stormy Fabian 06/06/2020, 3:31 PM

## 2020-06-06 NOTE — TOC Progression Note (Addendum)
Transition of Care (TOC) - Progression Note    Patient Details  Name: Justin Shannon. MRN: [id number] Date of Birth: June 01, 1941  Transition of Care [facility]) CM/SW [place], LCSW Phone Number: 06/06/2020, 1:01 PM  Clinical Narrative:     CSW called patient's daughter to discuss PT recommendation for SNF rehab. Left a voicemail requesting a return call.   1:35- Spoke to patient's daughter Justin Shannon. At this time she is refusing SNF for patient. She will talk with family to confirm and let CSW know if they change their mind. At this time plan is return home and resume Home Health services. She reported patient is active with [facility]. CSW reached out to [place] with [facility] who confirmed they are active with this patient.  2:25- Call from patient's daughter who asked which SNFs are available. Explained local SNFs who are taking patients and that we could also send patient's info to any other SNFs they were interested in. She asked about [facility] and [facility]. She reported it is fine to send patient's information to theses SNFs. CSW spoke with Seth Bake at [facility], no bed availability this week. She asked about [facility], CSW called and left a voicemail for [place] asking about availability. Sent patient's information to [facility] for them to review as well if they do have availability.  2:40- There is a DC order in for patient. CSW called [facility] again, left a message with Receptionist and asked for a call from [place]. Updated MD and RN that family now may want SNF. If SNF is chosen, would have to get insurance authorization.  3:40- CSW still has not heard back from [facility]. CSW called patient's daughter Justin Shannon who confirmed if [facility] can take patient, they would want him to go there for rehab. If [facility] cannot take patient, she plans to take him home with home health services. Justin Shannon is agreeable to CSW beginning insurance authorization for [facility] and will cancel if it turns out that  [facility] cannot take patient. CSW updated MD and RN.  3:45- CSW received a call from [place] with [facility] who reported they cannot accept patient due to not being able to meet his care needs. CSW updated MD, RN, and patient's daughter Justin Shannon. Justin Shannon reported they want patient to return home with home health. She reported she would need to wait until her husband is home so he can physically help her get patient home, would be around 5:30 pm to leave here. Updated RN. Updated Helene Kelp with [facility] and Penny/Karen with [facility] of patient being discharged.  . Expected Discharge Plan: [place] Barriers to Discharge: Continued Medical Work up  Expected Discharge Plan and Services Expected Discharge Plan: [place] arrangements for the past 2 months: Single Family Home                                       Social Determinants of Health (SDOH) Interventions    Readmission Risk Interventions No flowsheet data found.

## 2020-06-06 NOTE — Plan of Care (Signed)
  Problem: Education: Goal: Knowledge of disease or condition will improve Outcome: Not Progressing Goal: Knowledge of secondary prevention will improve Outcome: Not Progressing   Problem: Coping: Goal: Will identify appropriate support needs Outcome: Not Progressing   Problem: Self-Care: Goal: Ability to participate in self-care as condition permits will improve Outcome: Not Progressing Goal: Ability to communicate needs accurately will improve Outcome: Not Progressing   Problem: Education: Goal: Knowledge of General Education information will improve Description: Including pain rating scale, medication(s)/side effects and non-pharmacologic comfort measures Outcome: Not Progressing

## 2020-06-06 NOTE — Progress Notes (Signed)
Neurology Progress Note   S:// No acute changes.   O:// Current vital signs: BP (!) 118/54 (BP Location: Right Arm)   Pulse 79   Temp 98.1 F (36.7 C)   Resp 17   Ht 5\' 10"  (1.778 m)   Wt 134.6 kg   SpO2 98%   BMI 42.59 kg/m  Vital signs in last 24 hours: Temp:  [97.7 F (36.5 C)-98.1 F (36.7 C)] 98.1 F (36.7 C) (12/15 1121) Pulse Rate:  [63-79] 79 (12/15 1121) Resp:  [15-18] 17 (12/15 1121) BP: (118-142)/(54-81) 118/54 (12/15 1121) SpO2:  [98 %-100 %] 98 % (12/15 1121) Neurological exam Awake alert oriented to self Was able to tell me that he is at [facility] today. Poor attention concentration Able to name simple objects.  Inconsistent repetition. No dysarthria Cranial nerves II to XII intact.  Again noted some hypomimia Motor exam: Antigravity bilateral upper extremities with some increased tone/paratonia.  Subtle left upper extremity weakness still evident today-unchanged from yesterday.  Both lower extremity hip flexors are 3/5. Bradykinetic and both upper extremities which is worse on the left. Sensory exam intact Coordination: Mild asymmetric resting tremor affecting the left upper extremity.  No gross dysmetria Gait not tested    medications  Current Facility-Administered Medications:  .   stroke: mapping our early stages of recovery book, , Does not apply, Once, Zada Finders R, MD .  0.9 %  sodium chloride infusion, , Intravenous, Continuous, Kc, Ramesh, MD, Last Rate: 100 mL/hr at 06/06/20 0845, New Bag at 06/06/20 0845 .  acetaminophen (TYLENOL) tablet 650 mg, 650 mg, Oral, Q4H PRN **OR** acetaminophen (TYLENOL) 160 MG/5ML solution 650 mg, 650 mg, Per Tube, Q4H PRN **OR** acetaminophen (TYLENOL) suppository 650 mg, 650 mg, Rectal, Q4H PRN, Patel, Vishal R, MD .  albuterol (VENTOLIN HFA) 108 (90 Base) MCG/ACT inhaler 2 puff, 2 puff, Inhalation, Q4H PRN, Zada Finders R, MD .  allopurinol (ZYLOPRIM) tablet 100 mg, 100 mg, Oral, Daily,  Zada Finders R, MD, 100 mg at 06/06/20 0834 .  atorvastatin (LIPITOR) tablet 20 mg, 20 mg, Oral, QHS, Zada Finders R, MD, 20 mg at 06/05/20 2221 .  docusate sodium (COLACE) capsule 100 mg, 100 mg, Oral, Daily PRN, Posey Pronto, Vishal R, MD .  fluticasone (FLONASE) 50 MCG/ACT nasal spray 2 spray, 2 spray, Each Nare, Daily PRN, Swayze, Ava, DO .  gabapentin (NEURONTIN) capsule 100 mg, 100 mg, Oral, TID, Zada Finders R, MD, 100 mg at 06/06/20 2595 .  lamoTRIgine (LAMICTAL) tablet 25 mg, 25 mg, Oral, QHS, Zada Finders R, MD, 25 mg at 06/05/20 2223 .  levETIRAcetam (KEPPRA) tablet 500 mg, 500 mg, Oral, BID, Zada Finders R, MD, 500 mg at 06/05/20 2223 .  melatonin tablet 5 mg, 5 mg, Oral, QHS PRN, Lenore Cordia, MD, 5 mg at 06/04/20 2249 .  mometasone-formoterol (DULERA) 200-5 MCG/ACT inhaler 2 puff, 2 puff, Inhalation, BID, Lenore Cordia, MD, 2 puff at 06/06/20 0835 .  QUEtiapine (SEROQUEL) tablet 12.5 mg, 12.5 mg, Oral, BID, Amie Portland, MD, 12.5 mg at 06/06/20 0834 .  rivaroxaban (XARELTO) tablet 10 mg, 10 mg, Oral, QAC breakfast, Zada Finders R, MD, 10 mg at 06/06/20 0834 .  senna-docusate (Senokot-S) tablet 1 tablet, 1 tablet, Oral, QHS PRN, Lenore Cordia, MD Labs CBC    Component Value Date/Time   WBC 5.2 06/04/2020 1323   RBC 4.19 (L) 06/04/2020 1323   HGB 9.5 (L) 06/04/2020 1323   HGB 12.6 (L) 10/23/2012 6387  HCT 30.2 (L) 06/04/2020 1323   HCT 39.4 (L) 10/23/2012 0519   PLT 184 06/04/2020 1323   PLT 174 10/23/2012 0519   MCV 72.1 (L) 06/04/2020 1323   MCV 72 (L) 10/23/2012 0519   MCH 22.7 (L) 06/04/2020 1323   MCHC 31.5 06/04/2020 1323   RDW 19.6 (H) 06/04/2020 1323   RDW 18.1 (H) 10/23/2012 0519   LYMPHSABS 1.4 06/04/2020 1323   LYMPHSABS 1.6 10/23/2012 0519   MONOABS 0.7 06/04/2020 1323   MONOABS 0.5 10/23/2012 0519   EOSABS 0.2 06/04/2020 1323   EOSABS 0.1 10/23/2012 0519   BASOSABS 0.0 06/04/2020 1323   BASOSABS 0.0 10/23/2012 0519    CMP     Component Value  Date/Time   NA 140 06/06/2020 1238   NA 140 10/23/2012 0519   K 5.0 06/06/2020 1238   K 4.4 10/23/2012 0519   CL 111 06/06/2020 1238   CL 109 (H) 10/23/2012 0519   CO2 24 06/06/2020 1238   CO2 24 10/23/2012 0519   GLUCOSE 92 06/06/2020 1238   GLUCOSE 144 (H) 10/23/2012 0519   BUN 47 (H) 06/06/2020 1238   BUN 26 (H) 10/23/2012 0519   CREATININE 1.94 (H) 06/06/2020 1238   CREATININE 1.73 (H) 10/23/2012 0519   CALCIUM 9.1 06/06/2020 1238   CALCIUM 9.6 10/23/2012 0519   PROT 6.7 06/06/2020 0424   PROT 7.9 09/02/2011 1539   ALBUMIN 3.2 (L) 06/06/2020 0424   ALBUMIN 3.7 09/02/2011 1539   AST 22 06/06/2020 0424   AST 46 (H) 09/02/2011 1539   ALT 15 06/06/2020 0424   ALT 53 09/02/2011 1539   ALKPHOS 84 06/06/2020 0424   ALKPHOS 74 09/02/2011 1539   BILITOT 0.4 06/06/2020 0424   BILITOT 0.4 09/02/2011 1539   GFRNONAA 35 (L) 06/06/2020 1238   GFRNONAA 39 (L) 10/23/2012 0519   GFRAA 36 (L) 12/07/2019 1652   GFRAA 45 (L) 10/23/2012 0519    glycosylated hemoglobin  Lipid Panel     Component Value Date/Time   CHOL 96 06/05/2020 0816   TRIG 81 06/05/2020 0816   HDL 57 06/05/2020 0816   CHOLHDL 1.7 06/05/2020 0816   VLDL 16 06/05/2020 0816   LDLCALC 23 06/05/2020 0816   Echo IMPRESSIONS  1. Left ventricular ejection fraction, by estimation, is >55%. The left  ventricle has normal function. Left ventricular endocardial border not  optimally defined to evaluate regional wall motion. Left ventricular  diastolic parameters are consistent with  Grade II diastolic dysfunction (pseudonormalization). Elevated left atrial  pressure.  2. Right ventricular systolic function was not well visualized. The right  ventricular size is mildly enlarged.  3. Left atrial size was mildly dilated.  4. The mitral valve is grossly normal. Mild mitral valve regurgitation.  No evidence of mitral stenosis.  5. The aortic valve was not well visualized. Aortic valve regurgitation  is not  visualized. No aortic stenosis is present.  6. Pulmonic valve regurgitation not well assessed.   Imaging I have reviewed images in epic and the results pertinent to this consultation are: MR brain with no acute changes  EEG with slowing-mild encephalopathy.  No seizures or epileptiform activity  Assessment:  79 year old man with progressively worsening course of Lewy body dementia complicated by hallucinations and episodes concerning for seizures-on Keppra, recently started on Lamictal and also on gabapentin presenting for evaluation of 1 to 2-hour episode of speech disturbance/slurred speech. Doubt that this is a TIA but more suspicious for this being fluctuating course of his dementia.  Added Seroquel yesterday and reduced gabapentin.  Impression: Lewy body dementia REM sleep behavior disorder Doubt this is TIA History of seizures  Recommendations: Gabapentin 100 3 times daily Keppra 500 twice daily Seroquel 12.5 twice daily Lamotrigine titration as advised by outpatient neurologist. Consider outpatient LTM monitoring if suspicion remains for recurrent seizure activity Memory disorders clinic evaluation as an outpatient Had a detailed discussion with the daughter about the treatment philosophy as well as expectations regarding the progression of his neurodegenerative process.  She is very well-informed and expressed understanding of the discussion.  Also had a discussion about possibility of placement in a memory disorders facility-this is something she will discuss with family.   Although stroke and TIAs lower in the differentials, he did get some work-up done for evaluation.  Echocardiogram shows mild left atrial enlargement.  This could sometimes be seen in the setting of paroxysmal atrial fibrillation.  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  Continue with anticoagulation.  Discussed with Dr.  Lupita Leash  -- Amie Portland, MD [facility] Pager: [phone] If 7pm to 7am, please call on call as listed on AMION.

## 2020-06-06 NOTE — Discharge Summary (Signed)
Physician Discharge Summary  [name]. [name] DOB: 10/11/1940 DOA: 06/04/2020  PCP: [name], DO  Admit date: 06/04/2020 Discharge date: 06/06/2020  Admitted From: home Disposition:  hh  Recommendations for Outpatient Follow-up:  1. Follow up with PCP in 1-2 weeks 2. Please obtain BMP/CBC in one week 3. Please follow up on the following pending results:  Home Health:yes  Equipment/Devices: none  Discharge Condition: Stable Code Status:   Code Status: Full Code Diet recommendation:  Diet Order            Diet - low sodium heart healthy           Diet regular Room service appropriate? 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  In [place] patient's daughter was giving him his medications when she noticed that his speech was slurred, significantly changed from baseline. He appeared awake and alert but his speech was dysarthric. She did not see any asymmetry of his face. He has chronic left-sided weakness which did not seem significantly changed from his baseline. 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 His daughter called her neurologist who recommended he come to the ED for stroke evaluation. Symptoms lasted approximately 2 hours before resolving on their own and at time of admission patient appears to be near his baseline.  Daughter states that patient was hospitalized for COVID-19  pneumonia1/1/2021at which time he was treated with remdesivir and steroids.Since then he has had breathing issues and required Advair and rescue albuterol at home. He has a chronic nonproductive cough and intermittent shortness of breath.  Recently he has been followed by his neurologist for evaluation of suspected focal partial seizures described as a "trancelike" gaze with rhythmic tremor of his left foot. He has been taking Keppra for many years but is now 58 has now started him on gabapentin with plan to start and titrate up lamotrigine over an11-weekperiod with eventual taper off of Keppra.  Daughter states that he has been having increased tremors in his hands, right greater than left, slow movement, and visual hallucinations (more notable at night). Patient also states that his short-term memory is impaired. His neurologist isconcernedfor Lewy body dementia  Currently patient denies any recent chest pain, palpitations, subjective fevers, chills, diaphoresis, abdominal pain, dysuria. He has chronic swelling of both of his legs. He is largely wheelchair-bound. On occasion will use a walker but requires maximum assistance per daughter.  [facility] hospitalists were consulted to admit the patient for further evaluation and care. Neurology was consulted. The patient was admitted to a telemetry bed.  CT head was performed which was negative for acute intracranial abnormality. There is mild cerebral atrophy and chronic small vessel ischmic disease which was stable when compared to 05/23/2020 Ct head. There was also a small chronic left cerebellar infarct present that was also seen on a prior image.  MRI Brain: There is no evidence of acute intracranial abnormality or infarct. The study was motion limited. There was evidence of chronic microvascular  ischemic disease, remote bilateral cerebellar lacunar infarcts, and diffuse cerebral atrophy.  Bilateral carotic ultrasound demonstrated no  significant plaque or stenosis of the right carotid arteries. It did demonstrate a small amound of plaque at the left carotid bulb without significant stenosis in the left internal carotid artery. Bilateral vertebral arteries are patent with antegrade flow.  Echocardiogram: EF greater than 55% with normal left ventricle function. There is grade II diastolic dysfunction. There is elevated left atrial pressure. Although RV function could not be well visualized, the size was mildly enlarged.The interatrial septum was not well visualized.  EEG pending.  Dr. Rory Percy states that he feels that the symptoms for which the patient was admitted can primarily be attributed to progression of the neurodegenerative process of which the patient suffers, Lewy Body Dementia. He doubts that TIA/CVA is playing a role.   From here he has recommended a EEG, reducing dose of gabapentin, and skipping the nightly 300 mg dose of gabapentin. He recommended continuing the lamotrigine as previously prescribed by the patient's outpatient neurologist. Seroquel 12.5 mg bid was started. He recommended continuing the bid dosing of 500 mg of Keppra. He also recommended possible outpatient LTM EEG if the patient has increasing episodes that are concerning for seizures.   Patient was evaluated in next day his mental status improved no more slurring of his speech.  He feels well seen by PT OT skilled nursing facility advised but patient/daughter is refusing and is being discharged home.  Discussed neurology okay to discharge home Neurontin dose has been cut down to 100 mg twice daily given the recurrence of slurring of his speech after Neurontin dose while here.  No more bedtime Neurontin.  He will continue with Lamictal dosing as instructed by his outpatient neurology as outlined below.  Home health is being set up by case manager. His lamictal dosing based upon his neurology input was as below and he was supposed to stop Keppra after he  reaches at 150 of Lamictal AM PM Week 1-2: - 25 mg Week 3-4: 25 25 mg Week 5-6: 25 mg 50 mg Week 7-8 50 mg 50 mg Week 9-10: 100 mg 100 mg Week 11: 150 mg 150 mg Patient should have lamotrigine level, CBC, and CMP done in 2 months from starting the medication. patient was seen again aloing with patient's daughter.  She is in agreement discharge home with home health care.   Discharge Diagnoses:  Slurring of speech likely in the setting of Lewy body dementia EEG and MRI brain no acute finding.  Neurontin dose has been cut down.  Continue Lamictal as per outpatient recommendation Seroquel has been added and gabapentin reducedto bid as per neurology.  Stroke/TIA has been ruled out Seizure, EEG negative.  Continue on Keppra.   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 intake well.  Discharge Exam: Vitals:   06/06/20 1121 06/06/20 1610  BP: (!) 118/54 (!) 149/57  Pulse: 79 89  Resp: 17 17  Temp: 98.1 F (36.7 C) 97.6 F (36.4 C)  SpO2: 98% 100%   General: Pt is alert, awake, not in acute distress Cardiovascular: RRR, S1/S2 +, no rubs, no gallops Respiratory: CTA bilaterally, no  wheezing, no rhonchi Abdominal: Soft, NT, ND, bowel sounds + Extremities: no edema, no cyanosis  Discharge Instructions  Discharge Instructions    Diet - low sodium heart healthy   Complete by: As directed    Discharge instructions   Complete by: As directed    Check BMP in 5 to 7 days from your primary care doctor  Please follow-up as per your neurologist the LAMICTAL Titration dose Please call call MD or return to ER for similar or worsening  recurring problem that brought you to hospital or if any fever,nausea/vomiting,abdominal pain, uncontrolled pain, chest pain,  shortness of breath or any other alarming symptoms.  Please follow-up your doctor as instructed in a week time and call the office for appointment.  Please avoid alcohol, smoking, or any other illicit substance and maintain healthy habits including taking your regular medications as prescribed.  You were cared for by a hospitalist during your hospital stay. If you have any questions about your discharge medications or the care you received while you were in the hospital after you are discharged, you can call the unit and ask to speak with the hospitalist on call if the hospitalist that took care of you is not available.  Once you are discharged, your primary care physician will handle any further medical issues. Please note that NO REFILLS for any discharge medications will be authorized once you are discharged, as it is imperative that you return to your primary care physician (or establish a relationship with a primary care physician if you do not have one) for your aftercare needs so that they can reassess your need for medications and monitor your lab values   Discharge wound care:   Complete by: As directed    Right lower leg ulceration keep it dry clean intact   Increase activity slowly   Complete by: As directed      Allergies as of 06/06/2020      Reactions   Shellfish Allergy Anaphylaxis, Other (See Comments)   gout   Iodinated Diagnostic Agents Other (See Comments)   Chronic kidney disease 3b   Nsaids Other (See Comments)   Chronic kidney disease 3b   Rivastigmine Other (See Comments)   Side effect and not a true allergy      Medication List    TAKE these medications   albuterol 108 (90 Base) MCG/ACT inhaler Commonly known as: VENTOLIN HFA Inhale 2 puffs into the lungs every 4 (four) hours as needed for wheezing.   allopurinol 100 MG  tablet Commonly known as: ZYLOPRIM Take 100 mg by mouth daily.   atorvastatin 20 MG tablet Commonly known as: LIPITOR Take 1 tablet (20 mg total) by mouth at bedtime.   docusate sodium 100 MG capsule Commonly known as: COLACE Take 100 mg by mouth daily as needed for mild constipation or moderate constipation.   fexofenadine 180 MG tablet Commonly known as: ALLEGRA Take 180 mg by mouth daily as needed for allergies.   fluticasone 50 MCG/ACT nasal spray Commonly known as: FLONASE Place 2 sprays into both nostrils daily as needed for allergies.   Fluticasone-Salmeterol 250-50 MCG/DOSE Aepb Commonly known as: ADVAIR Inhale 1 puff into the lungs 2 (two) times daily.   gabapentin 100 MG capsule Commonly known as: NEURONTIN Take 1 capsule (100 mg total) by mouth 2 (two) times daily. What changed:   when to take this  Another medication with the same name was removed. Continue taking this medication, and follow the directions you see  here.   lamoTRIgine 25 MG tablet Commonly known as: LAMICTAL Take 6 tablets (150 mg total) by mouth 2 (two) times daily Follow titration instructions given in office by your neurology.   levETIRAcetam 500 MG tablet Commonly known as: KEPPRA Take 500 mg by mouth 2 (two) times daily.   Magnesium 250 MG Tabs Take 250 mg by mouth daily.   melatonin 5 MG Tabs Take 5 mg by mouth at bedtime as needed (sleep).   metoprolol succinate 50 MG 24 hr tablet Commonly known as: TOPROL-XL Take 50 mg by mouth daily.   ONE-A-DAY MENS PO Take 1 tablet by mouth daily.   QUEtiapine 25 MG tablet Commonly known as: SEROQUEL Take 0.5 tablets (12.5 mg total) by mouth 2 (two) times daily.   Xarelto 10 MG Tabs tablet Generic drug: rivaroxaban Take 10 mg by mouth daily before breakfast.            Discharge Care Instructions  (From admission, onward)         Start     Ordered   06/06/20 0000  Discharge wound care:       Comments: Right lower leg  ulceration keep it dry clean intact   06/06/20 1411          Follow-up Information    [name], DO On 06/11/2020.   Specialty: Family Medicine Why: at 11:00am; check in at 10:45am for bmp  Contact information: [address] [place] [address] [phone]              Allergies  Allergen Reactions  . Shellfish Allergy Anaphylaxis and Other (See Comments)    gout  . Iodinated Diagnostic Agents Other (See Comments)    Chronic kidney disease 3b  . Nsaids Other (See Comments)    Chronic kidney disease 3b  . Rivastigmine Other (See Comments)    Side effect and not a true allergy    The results of significant diagnostics from this hospitalization (including imaging, microbiology, ancillary and laboratory) are listed below for reference.    Microbiology: Recent Results (from the past 240 hour(s))  Resp Panel by RT-PCR (Flu A&B, Covid) Nasopharyngeal Swab     Status: None   Collection Time: 06/04/20  5:13 PM   Specimen: Nasopharyngeal Swab; Nasopharyngeal(NP) swabs in vial transport medium  Result Value Ref Range Status   SARS Coronavirus 2 by RT PCR NEGATIVE NEGATIVE Final    Comment: (NOTE) SARS-CoV-2 target nucleic acids are NOT DETECTED.  The SARS-CoV-2 RNA is generally detectable in upper respiratory specimens during the acute phase of infection. The lowest concentration of SARS-CoV-2 viral copies this assay can detect is 138 copies/mL. A negative result does not preclude SARS-Cov-2 infection and should not be used as the sole basis for treatment or other patient management decisions. A negative result may occur with  improper specimen collection/handling, submission of specimen other than nasopharyngeal swab, presence of viral mutation(s) within the areas targeted by this assay, and inadequate number of viral copies(<138 copies/mL). A negative result must be combined with clinical observations, patient history, and epidemiological information. The  expected result is Negative.  Fact Sheet for Patients:  EntrepreneurPulse.com.au  Fact Sheet for Healthcare Providers:  IncredibleEmployment.be  This test is no t yet approved or cleared by the [place] FDA and  has been authorized for detection and/or diagnosis of SARS-CoV-2 by FDA under an Emergency Use Authorization (EUA). This EUA will remain  in effect (meaning this test can be used) for the  duration of the COVID-19 declaration under Section 564(b)(1) of the Act, 21 U.S.C.section 360bbb-3(b)(1), unless the authorization is terminated  or revoked sooner.       Influenza A by PCR NEGATIVE NEGATIVE Final   Influenza B by PCR NEGATIVE NEGATIVE Final    Comment: (NOTE) The Xpert Xpress SARS-CoV-2/FLU/RSV plus assay is intended as an aid in the diagnosis of influenza from Nasopharyngeal swab specimens and should not be used as a sole basis for treatment. Nasal washings and aspirates are unacceptable for Xpert Xpress SARS-CoV-2/FLU/RSV testing.  Fact Sheet for Patients: EntrepreneurPulse.com.au  Fact Sheet for Healthcare Providers: IncredibleEmployment.be  This test is not yet approved or cleared by the [place] FDA and has been authorized for detection and/or diagnosis of SARS-CoV-2 by FDA under an Emergency Use Authorization (EUA). This EUA will remain in effect (meaning this test can be used) for the duration of the COVID-19 declaration under Section 564(b)(1) of the Act, 21 U.S.C. section 360bbb-3(b)(1), unless the authorization is terminated or revoked.  Performed at [facility], [address] [place], [place] [address]     Procedures/Studies: EEG  Result Date: 06/06/2020 Lora Havens, MD     06/06/2020  8:33 AM Patient Name: Justin Shannon. MRN: [id number] Epilepsy Attending: Lora Havens Referring Physician/Provider: Dr Amie Portland Date: 06/05/2020 Duration:   31.10 mins Patient history: 79 year old man with a progressively worsening course of Lewy body dementia complicated by hallucinations and episodes concerning for seizures, being treated with Keppra and recent addition of Lamictal as well as gabapentin brought in for a 1 to 2-hour episode of slurred speech. EEG to evaluate for seizure Level of alertness: Awake, asleep AEDs during EEG study: GBP, LTG, LEV Technical aspects: This EEG study was done with scalp electrodes positioned according to the 10-20 International system of electrode placement. Electrical activity was acquired at a sampling rate of 500Hz  and reviewed with a high frequency filter of 70Hz  and a low frequency filter of 1Hz . EEG data were recorded continuously and digitally stored. Description: No posterior dominant rhythm was seen. Sleep was characterized by vertex waves, sleep spindles (12 to 14 Hz), maximal frontocentral region.  EEG showed continuous generalized 5 to 7 Hz theta-delta slowing. Physiologic photic driving was not seen during photic stimulation.  Hyperventilation was not performed.   ABNORMALITY -Continuous slow, generalized IMPRESSION: This study is suggestive of mild to moderate diffuse encephalopathy, nonspecific etiology. No seizures or epileptiform discharges were seen throughout the recording. Lora Havens   DG Chest 2 View  Result Date: 06/04/2020 CLINICAL DATA:  Shortness of breath. EXAM: CHEST - 2 VIEW COMPARISON:  May 02, 2020. FINDINGS: Stable cardiomediastinal silhouette. Hypoinflation of the lungs is noted with mild bibasilar subsegmental atelectasis. No pneumothorax or pleural effusion is noted. Bony thorax is unremarkable. IMPRESSION: Hypoinflation of the lungs with mild bibasilar subsegmental atelectasis. Electronically Signed   By: Marijo Conception M.D.   On: 06/04/2020 19:51   CT HEAD WO CONTRAST  Result Date: 06/04/2020 CLINICAL DATA:  Neuro deficit, acute, stroke suspected. Additional history  provided: 2 are. Of slurred speech. EXAM: CT HEAD WITHOUT CONTRAST TECHNIQUE: Contiguous axial images were obtained from the base of the skull through the vertex without intravenous contrast. COMPARISON:  Prior head CT examinations 05/23/2020 and earlier. Brain MRI 09/03/2012. FINDINGS: Brain: Mild cerebral atrophy. Mild patchy and ill-defined hypoattenuation within the cerebral white matter is nonspecific, but compatible with chronic small vessel ischemic disease. Redemonstrated small chronic infarct within the left cerebellar hemisphere.  There is no acute intracranial hemorrhage. No demarcated cortical infarct. No extra-axial fluid collection. No evidence of intracranial mass. No midline shift. Vascular: No hyperdense vessel.  Atherosclerotic calcifications. Skull: Normal. Negative for fracture or focal lesion. Sinuses/Orbits: Visualized orbits show no acute finding. No significant paranasal sinus disease at the imaged levels. IMPRESSION: No evidence of acute intracranial abnormality. Mild cerebral atrophy and chronic small vessel ischemic disease, stable as compared to the head CT of 05/23/2020. Redemonstrated small chronic left cerebellar infarct. Electronically Signed   By: Kellie Simmering DO   On: 06/04/2020 13:20   CT HEAD WO CONTRAST  Result Date: 05/23/2020 CLINICAL DATA:  Cognitive impairment with concern for parkinsonism and dementia EXAM: CT HEAD WITHOUT CONTRAST TECHNIQUE: Contiguous axial images were obtained from the base of the skull through the vertex without intravenous contrast. COMPARISON:  2014 FINDINGS: Brain: There is no acute intracranial hemorrhage, mass effect, or edema. Gray-white differentiation is preserved. There is no extra-axial fluid collection. Prominence of the ventricles and sulci reflects mild generalized parenchymal volume loss. Small chronic left cerebellar infarct. Additional patchy hypoattenuation in the supratentorial white matter is nonspecific but may reflect mild chronic  microvascular ischemic changes. Vascular: There is mild atherosclerotic calcification at the skull base. Skull: Calvarium is unremarkable. Sinuses/Orbits: No acute finding. Other: None. IMPRESSION: No acute intracranial abnormality. Mild chronic microvascular ischemic changes. Chronic small left cerebellar infarct. Mild generalized parenchymal volume loss. Electronically Signed   By: Macy Mis M.D.   On: 05/23/2020 19:50   MR BRAIN WO CONTRAST  Result Date: 06/05/2020 CLINICAL DATA:  Transient ischemic attack. EXAM: MRI HEAD WITHOUT CONTRAST TECHNIQUE: Multiplanar, multiecho pulse sequences of the brain and surrounding structures were obtained without intravenous contrast. COMPARISON:  CT head June 04, 2020. FINDINGS: Brain: No acute infarction, hemorrhage, hydrocephalus, extra-axial collection or mass lesion. Remote bilateral cerebellar lacunar infarcts. Diffuse cerebral atrophy with ex vacuo ventricular dilation. Scattered T2/FLAIR hyperintensities within the white matter, most likely related to chronic microvascular ischemic disease. Vascular: Major arterial flow voids are maintained at the skull base. Skull and upper cervical spine: Normal marrow signal. Right frontal scalp lipoma Sinuses/Orbits: Sinuses are clear.  Unremarkable orbits. Other: Motion limited exam. IMPRESSION: 1. No evidence of acute intracranial abnormality on this motion limited exam. Specifically, no acute infarct. 2. Chronic microvascular ischemic disease, remote bilateral cerebellar lacunar infarcts, and diffuse cerebral atrophy. Electronically Signed   By: Margaretha Sheffield MD   On: 06/05/2020 07:33   US Carotid Bilateral  Result Date: 06/05/2020 CLINICAL DATA:  TIA. EXAM: BILATERAL CAROTID DUPLEX ULTRASOUND TECHNIQUE: Pearline Cables scale imaging, color Doppler and duplex ultrasound were performed of bilateral carotid and vertebral arteries in the neck. COMPARISON:  None. FINDINGS: Criteria: Quantification of carotid stenosis is  based on velocity parameters that correlate the residual internal carotid diameter with NASCET-based stenosis levels, using the diameter of the distal internal carotid lumen as the denominator for stenosis measurement. The following velocity measurements were obtained: RIGHT ICA: 72/18 cm/sec CCA: 78/93 cm/sec SYSTOLIC ICA/CCA RATIO:  0.8 ECA: 94 cm/sec LEFT ICA: 48/11 cm/sec CCA: 81/01 cm/sec SYSTOLIC ICA/CCA RATIO:  0.9 ECA: 102 cm/sec RIGHT CAROTID ARTERY: Right carotid arteries are patent without significant plaque or stenosis. External carotid artery is patent with normal waveform. Normal waveforms and velocities in the internal carotid artery. RIGHT VERTEBRAL ARTERY: Antegrade flow and normal waveform in the right vertebral artery. LEFT CAROTID ARTERY: Echogenic plaque at the left carotid bulb region. External carotid artery is patent with normal waveform. Normal waveforms and  velocities in the internal carotid artery. LEFT VERTEBRAL ARTERY: Antegrade flow and normal waveform in the left vertebral artery. IMPRESSION: 1. Right carotid arteries are patent without significant plaque or stenosis. 2. Small amount of plaque at the left carotid bulb. No significant stenosis in left internal carotid artery. 3. Bilateral vertebral arteries are patent with antegrade flow. Electronically Signed   By: Markus Daft M.D.   On: 06/05/2020 09:44   ECHOCARDIOGRAM COMPLETE  Result Date: 06/05/2020    ECHOCARDIOGRAM REPORT   Patient Name:   [facility]. Date of Exam: 06/05/2020 Medical Rec #:  [id number]          Height:       70.0 in Accession #:    [id number]         Weight:       296.8 lb Date of Birth:  Nov 11, 1940           BSA:          2.469 m Patient Age:    7 years           BP:           138/66 mmHg Patient Gender: M                  HR:           72 bpm. Exam Location:  [facility] Procedure: 2D Echo, Color Doppler and Cardiac Doppler Indications:     TIA 435.9  History:         Patient has no prior history of  Echocardiogram examinations.                  DVT.  Sonographer:     Sherrie Sport RDCS (AE) Referring Phys:  [id number] Cleaster Corin PATEL Diagnosing Phys: Harrell Gave End MD  Sonographer Comments: Technically challenging study due to limited acoustic windows, no subcostal window and no parasternal window. IMPRESSIONS  1. Left ventricular ejection fraction, by estimation, is >55%. The left ventricle has normal function. Left ventricular endocardial border not optimally defined to evaluate regional wall motion. Left ventricular diastolic parameters are consistent with Grade II diastolic dysfunction (pseudonormalization). Elevated left atrial pressure.  2. Right ventricular systolic function was not well visualized. The right ventricular size is mildly enlarged.  3. Left atrial size was mildly dilated.  4. The mitral valve is grossly normal. Mild mitral valve regurgitation. No evidence of mitral stenosis.  5. The aortic valve was not well visualized. Aortic valve regurgitation is not visualized. No aortic stenosis is present.  6. Pulmonic valve regurgitation not well assessed. FINDINGS  Left Ventricle: Left ventricular size and wall thickness are not well assessed. Left ventricular ejection fraction, by estimation, is >55%. The left ventricle has normal function. Left ventricular endocardial border not optimally defined to evaluate regional wall motion. Left ventricular diastolic parameters are consistent with Grade II diastolic dysfunction (pseudonormalization). Elevated left atrial pressure. Right Ventricle: The right ventricular size is mildly enlarged. Right vetricular wall thickness was not well visualized. Right ventricular systolic function was not well visualized. Left Atrium: Left atrial size was mildly dilated. Right Atrium: Right atrial size was normal in size. Pericardium: Trivial pericardial effusion is present. Mitral Valve: The mitral valve is grossly normal. Mild mitral valve regurgitation. No evidence of  mitral valve stenosis. Tricuspid Valve: The tricuspid valve is not well visualized. Tricuspid valve regurgitation is trivial. Aortic Valve: The aortic valve was not well visualized. Aortic valve regurgitation is not visualized. No aortic stenosis  is present. Aortic valve mean gradient measures 4.5 mmHg. Aortic valve peak gradient measures 8.8 mmHg. Pulmonic Valve: The pulmonic valve was not well visualized. Pulmonic valve regurgitation not well assessed. Aorta: The aortic root was not well visualized. Pulmonary Artery: The pulmonary artery is not well seen. IAS/Shunts: The interatrial septum was not well visualized.   Diastology LV e' medial:    5.00 cm/s LV E/e' medial:  17.5 LV e' lateral:   5.33 cm/s LV E/e' lateral: 16.4  RIGHT VENTRICLE RV Basal diam:  4.27 cm RV S prime:     14.50 cm/s LEFT ATRIUM             Index       RIGHT ATRIUM           Index LA diam:        4.20 cm 1.70 cm/m  RA Area:     17.20 cm LA Vol (A2C):   76.7 ml 31.07 ml/m RA Volume:   43.30 ml  17.54 ml/m LA Vol (A4C):   53.1 ml 21.51 ml/m LA Biplane Vol: 63.2 ml 25.60 ml/m  AORTIC VALVE AV Vmax:           148.50 cm/s AV Vmean:          103.000 cm/s AV VTI:            0.334 m AV Peak Grad:      8.8 mmHg AV Mean Grad:      4.5 mmHg LVOT Vmax:         94.00 cm/s LVOT Vmean:        67.300 cm/s LVOT VTI:          0.237 m LVOT/AV VTI ratio: 0.71 MITRAL VALVE                TRICUSPID VALVE MV Area (PHT): 3.21 cm     TR Peak grad:   17.6 mmHg MV Decel Time: 236 msec     TR Vmax:        210.00 cm/s MV E velocity: 87.40 cm/s MV A velocity: 103.00 cm/s  SHUNTS MV E/A ratio:  0.85         Systemic VTI: 0.24 m Harrell Gave End MD Electronically signed by Nelva Bush MD Signature Date/Time: 06/05/2020/3:38:50 PM    Final     Labs: BNP (last 3 results) No results for input(s): BNP in the last 8760 hours. Basic Metabolic Panel: Recent Labs  Lab 06/04/20 1323 06/06/20 0424 06/06/20 1238  NA 140 141 140  K 4.9 5.2* 5.0  CL 109 112* 111   CO2 24 25 24   GLUCOSE 86 95 92  BUN 45* 44* 47*  CREATININE 1.79* 1.86* 1.94*  CALCIUM 9.4 9.1 9.1   Liver Function Tests: Recent Labs  Lab 06/04/20 1323 06/06/20 0424  AST 25 22  ALT 17 15  ALKPHOS 101 84  BILITOT 0.5 0.4  PROT 7.3 6.7  ALBUMIN 3.5 3.2*   No results for input(s): LIPASE, AMYLASE in the last 168 hours. No results for input(s): AMMONIA in the last 168 hours. CBC: Recent Labs  Lab 06/04/20 1323  WBC 5.2  NEUTROABS 2.9  HGB 9.5*  HCT 30.2*  MCV 72.1*  PLT 184   Cardiac Enzymes: No results for input(s): CKTOTAL, CKMB, CKMBINDEX, TROPONINI in the last 168 hours. BNP: Invalid input(s): POCBNP CBG: Recent Labs  Lab 06/04/20 1326  GLUCAP 82   D-Dimer No results for input(s): DDIMER in the last 72 hours. Hgb  A1c Recent Labs    06/05/20 0816  HGBA1C 5.9*   Lipid Profile Recent Labs    06/05/20 0816  CHOL 96  HDL 57  LDLCALC 23  TRIG 81  CHOLHDL 1.7   Thyroid function studies No results for input(s): TSH, T4TOTAL, T3FREE, THYROIDAB in the last 72 hours.  Invalid input(s): FREET3 Anemia work up No results for input(s): VITAMINB12, FOLATE, FERRITIN, TIBC, IRON, RETICCTPCT in the last 72 hours. Urinalysis    Component Value Date/Time   COLORURINE STRAW (A) 12/07/2019 1200   APPEARANCEUR CLEAR (A) 12/07/2019 1200   APPEARANCEUR Clear 08/03/2019 1102   LABSPEC 1.006 12/07/2019 1200   LABSPEC 1.012 09/02/2012 0435   PHURINE 5.0 12/07/2019 1200   GLUCOSEU NEGATIVE 12/07/2019 1200   GLUCOSEU Negative 09/02/2012 0435   HGBUR SMALL (A) 12/07/2019 1200   BILIRUBINUR NEGATIVE 12/07/2019 1200   BILIRUBINUR Negative 08/03/2019 1102   BILIRUBINUR Negative 09/02/2012 0435   KETONESUR NEGATIVE 12/07/2019 1200   PROTEINUR NEGATIVE 12/07/2019 1200   NITRITE NEGATIVE 12/07/2019 1200   LEUKOCYTESUR TRACE (A) 12/07/2019 1200   LEUKOCYTESUR Negative 09/02/2012 0435   Sepsis Labs Invalid input(s): PROCALCITONIN,  WBC,   LACTICIDVEN Microbiology Recent Results (from the past 240 hour(s))  Resp Panel by RT-PCR (Flu A&B, Covid) Nasopharyngeal Swab     Status: None   Collection Time: 06/04/20  5:13 PM   Specimen: Nasopharyngeal Swab; Nasopharyngeal(NP) swabs in vial transport medium  Result Value Ref Range Status   SARS Coronavirus 2 by RT PCR NEGATIVE NEGATIVE Final    Comment: (NOTE) SARS-CoV-2 target nucleic acids are NOT DETECTED.  The SARS-CoV-2 RNA is generally detectable in upper respiratory specimens during the acute phase of infection. The lowest concentration of SARS-CoV-2 viral copies this assay can detect is 138 copies/mL. A negative result does not preclude SARS-Cov-2 infection and should not be used as the sole basis for treatment or other patient management decisions. A negative result may occur with  improper specimen collection/handling, submission of specimen other than nasopharyngeal swab, presence of viral mutation(s) within the areas targeted by this assay, and inadequate number of viral copies(<138 copies/mL). A negative result must be combined with clinical observations, patient history, and epidemiological information. The expected result is Negative.  Fact Sheet for Patients:  EntrepreneurPulse.com.au  Fact Sheet for Healthcare Providers:  IncredibleEmployment.be  This test is no t yet approved or cleared by the [place] FDA and  has been authorized for detection and/or diagnosis of SARS-CoV-2 by FDA under an Emergency Use Authorization (EUA). This EUA will remain  in effect (meaning this test can be used) for the duration of the COVID-19 declaration under Section 564(b)(1) of the Act, 21 U.S.C.section 360bbb-3(b)(1), unless the authorization is terminated  or revoked sooner.       Influenza A by PCR NEGATIVE NEGATIVE Final   Influenza B by PCR NEGATIVE NEGATIVE Final    Comment: (NOTE) The Xpert Xpress SARS-CoV-2/FLU/RSV plus  assay is intended as an aid in the diagnosis of influenza from Nasopharyngeal swab specimens and should not be used as a sole basis for treatment. Nasal washings and aspirates are unacceptable for Xpert Xpress SARS-CoV-2/FLU/RSV testing.  Fact Sheet for Patients: EntrepreneurPulse.com.au  Fact Sheet for Healthcare Providers: IncredibleEmployment.be  This test is not yet approved or cleared by the [place] FDA and has been authorized for detection and/or diagnosis of SARS-CoV-2 by FDA under an Emergency Use Authorization (EUA). This EUA will remain in effect (meaning this test can be used) for the duration  of the COVID-19 declaration under Section 564(b)(1) of the Act, 21 U.S.C. section 360bbb-3(b)(1), unless the authorization is terminated or revoked.  Performed at [facility], [address] [place], [place] [address]      Time coordinating discharge: 25  minutes  SIGNED: Antonieta Pert, MD  [facility] 06/06/2020, 4:26 PM  If 7PM-7AM, please contact night-coverage [email or web address]

## 2020-06-06 NOTE — Progress Notes (Signed)
[facility] hospital Liaison note:  This patient had an appointment set for 12/13 with the [facility] service at home, but was admitted to [facility] prior.  TOC Meagan Hagwood aware. Liaison will follow for disposition. Flo Shanks BSN, RN, [place] [phone]

## 2020-06-06 NOTE — Evaluation (Signed)
Physical Therapy Evaluation Patient Details Name: Justin Shannon. MRN: [id number] DOB: May 12, 1941 Today's Date: 06/06/2020   History of Present Illness  Pt is a 79 y.o. 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  Of note pt also s/p R TKA followed by R patellar tendon repair currently managed by Dr. Marry Guan with MD note 04/10/20 stating WBAT with knee hinge brace locked in full extension.  Pt presented to the ED for evaluation of slurred speech.  He has chronic left-sided weakness which did not seem significantly changed from his baseline.  MD assessment includes: mild to moderate diffuse encephalopathy, nonspecific etiology and slurred speech/seizure activity/hallucinations with TIA/CVA work up negative.   Clinical Impression  Pt was pleasant and motivated to participate during the session but ultimately was very limited functionally.  Pt required heavy +2 assist with all bed mobility tasks and was unable to come to standing despite heavy assist from an elevated EOB.  Pt presented with heavy posterior lean in sitting initially but this did improve after anterior weight shifting activities but did limit pt's ability to stand.  Pt is at a very high risk for falls and would not be safe to return to his prior living situation  at this time.  Pt will benefit from PT services in a SNF setting upon discharge to safely address deficits listed in patient problem list for decreased caregiver assistance and eventual return to PLOF.          Follow Up Recommendations SNF    Equipment Recommendations  None recommended by PT    Recommendations for Other Services       Precautions / Restrictions Precautions Precautions:  Fall Required Braces or Orthoses: Knee Immobilizer - Right Knee Immobilizer - Right: On when out of bed or walking Restrictions Weight Bearing Restrictions: Yes RLE Weight Bearing: Weight bearing as tolerated Other Position/Activity Restrictions: WBAT with RLE hinged knee brace locked in full extension      Mobility  Bed Mobility Overal bed mobility: Needs Assistance Bed Mobility: Rolling;Supine to Sit;Sit to Supine Rolling: Max assist;+2 for physical assistance   Supine to sit: Max assist;+2 for physical assistance Sit to supine: +2 for physical assistance;Max assist   General bed mobility comments: Mod verbal cues for sequencing    Transfers                 General transfer comment: Multiple attempts to stand from an elevated EOB with max verbal cues for sequencing with pt unable to clear the surface of the mattress  Ambulation/Gait                Stairs            Wheelchair Mobility    Modified Rankin (Stroke Patients Only)       Balance Overall balance assessment: Needs assistance Sitting-balance support: Bilateral upper extremity supported;Feet unsupported Sitting balance-Leahy Scale: Poor Sitting balance - Comments: Mod to Max A for stability secondary to posterior lean, improved during the session with anterior weight shifting activities Postural control: Posterior lean     Standing balance comment: unable  Pertinent Vitals/Pain Pain Assessment: No/denies pain    Home Living Family/patient expects to be discharged to:: Private residence Living Arrangements: Children Available Help at Discharge: Family;Available PRN/intermittently;Personal care attendant Type of Home: House Home Access: Ramped entrance     Home Layout: One level Home Equipment: Walker - 2 wheels;Shower seat;Bedside commode;Cane - single point;Hospital bed Additional Comments: Pt also has lift chair, urinal, and gait belt.   PCA for bathing 2x/wk.    Prior Function Level of Independence: Needs assistance   Gait / Transfers Assistance Needed: Pt has progressed to household ambulation with HHPT with w/c follow for safety  ADL's / Homemaking Assistance Needed: PCA 2x/wk for bathing, daughter also assists with ADLs        Hand Dominance   Dominant Hand: Right    Extremity/Trunk Assessment   Upper Extremity Assessment Upper Extremity Assessment: Generalized weakness    Lower Extremity Assessment Lower Extremity Assessment: Generalized weakness;RLE deficits/detail RLE: Unable to fully assess due to immobilization       Communication   Communication: No difficulties  Cognition Arousal/Alertness: Awake/alert Behavior During Therapy: WFL for tasks assessed/performed Overall Cognitive Status: Within Functional Limits for tasks assessed                                 General Comments: Pt with slow processing but A & O x4 this session.      General Comments      Exercises Total Joint Exercises Ankle Circles/Pumps: AROM;Strengthening;Both;5 reps;10 reps Gluteal Sets: Strengthening;Both;5 reps;10 reps Hip ABduction/ADduction: AAROM;Both;5 reps;10 reps;Strengthening Straight Leg Raises: AAROM;Strengthening;Both;5 reps;10 reps Long Arc Quad: AROM;Strengthening;Left;10 reps Knee Flexion: AROM;Strengthening;Left;10 reps  Anterior weight shifting activities in sitting to address posterior lean   Assessment/Plan    PT Assessment Patient needs continued PT services  PT Problem List Decreased strength;Decreased activity tolerance;Decreased balance;Decreased mobility;Decreased knowledge of use of DME       PT Treatment Interventions DME instruction;Gait training;Functional mobility training;Therapeutic activities;Therapeutic exercise;Balance training;Patient/family education    PT Goals (Current goals can be found in the Care Plan section)  Acute Rehab PT Goals Patient Stated Goal: To  get stronger and walk better PT Goal Formulation: With patient Time For Goal Achievement: 06/19/20 Potential to Achieve Goals: Good    Frequency Min 2X/week   Barriers to discharge Inaccessible home environment;Decreased caregiver support      Co-evaluation               AM-PAC PT "6 Clicks" Mobility  Outcome Measure Help needed turning from your back to your side while in a flat bed without using bedrails?: Total Help needed moving from lying on your back to sitting on the side of a flat bed without using bedrails?: Total Help needed moving to and from a bed to a chair (including a wheelchair)?: Total Help needed standing up from a chair using your arms (e.g., wheelchair or bedside chair)?: Total Help needed to walk in hospital room?: Total Help needed climbing 3-5 steps with a railing? : Total 6 Click Score: 6    End of Session Equipment Utilized During Treatment: Gait belt;Right knee immobilizer Activity Tolerance: Patient tolerated treatment well Patient left: in bed;with call bell/phone within reach;with bed alarm set Nurse Communication: Mobility status PT Visit Diagnosis: Difficulty in walking, not elsewhere classified (R26.2);Muscle weakness (generalized) (M62.81)    Time: 1025-1106 PT Time Calculation (min) (ACUTE ONLY): 41 min   Charges:   PT Evaluation $PT Eval  Moderate Complexity: 1 Mod PT Treatments $Therapeutic Exercise: 8-22 mins        D. Scott Woodward Klem PT, DPT 06/06/20, 11:53 AM

## 2020-06-07 ENCOUNTER — Telehealth: Payer: Self-pay | Admitting: Primary Care

## 2020-06-07 NOTE — Telephone Encounter (Signed)
Spoke with patient's daughter [name], to see if she wanted to reschedule the Initial Palliative Consult (patient was in the hospital for the initial appointment) and she was in agreement with this.  I have rescheduled the In-home Consult for 06/13/20 @ 8:30 AM.

## 2020-06-08 DIAGNOSIS — I890 Lymphedema, not elsewhere classified: Secondary | ICD-10-CM | POA: Diagnosis not present

## 2020-06-08 DIAGNOSIS — I872 Venous insufficiency (chronic) (peripheral): Secondary | ICD-10-CM | POA: Diagnosis not present

## 2020-06-08 DIAGNOSIS — G4733 Obstructive sleep apnea (adult) (pediatric): Secondary | ICD-10-CM | POA: Diagnosis not present

## 2020-06-08 DIAGNOSIS — J449 Chronic obstructive pulmonary disease, unspecified: Secondary | ICD-10-CM | POA: Diagnosis not present

## 2020-06-08 DIAGNOSIS — J309 Allergic rhinitis, unspecified: Secondary | ICD-10-CM | POA: Diagnosis not present

## 2020-06-08 DIAGNOSIS — E1142 Type 2 diabetes mellitus with diabetic polyneuropathy: Secondary | ICD-10-CM | POA: Diagnosis not present

## 2020-06-08 DIAGNOSIS — D631 Anemia in chronic kidney disease: Secondary | ICD-10-CM | POA: Diagnosis not present

## 2020-06-08 DIAGNOSIS — E1122 Type 2 diabetes mellitus with diabetic chronic kidney disease: Secondary | ICD-10-CM | POA: Diagnosis not present

## 2020-06-08 DIAGNOSIS — N184 Chronic kidney disease, stage 4 (severe): Secondary | ICD-10-CM | POA: Diagnosis not present

## 2020-06-08 DIAGNOSIS — Z96651 Presence of right artificial knee joint: Secondary | ICD-10-CM | POA: Diagnosis not present

## 2020-06-08 DIAGNOSIS — Z7901 Long term (current) use of anticoagulants: Secondary | ICD-10-CM | POA: Diagnosis not present

## 2020-06-08 DIAGNOSIS — Z86718 Personal history of other venous thrombosis and embolism: Secondary | ICD-10-CM | POA: Diagnosis not present

## 2020-06-08 DIAGNOSIS — M1712 Unilateral primary osteoarthritis, left knee: Secondary | ICD-10-CM | POA: Diagnosis not present

## 2020-06-08 DIAGNOSIS — E1151 Type 2 diabetes mellitus with diabetic peripheral angiopathy without gangrene: Secondary | ICD-10-CM | POA: Diagnosis not present

## 2020-06-08 DIAGNOSIS — M109 Gout, unspecified: Secondary | ICD-10-CM | POA: Diagnosis not present

## 2020-06-08 DIAGNOSIS — M4802 Spinal stenosis, cervical region: Secondary | ICD-10-CM | POA: Diagnosis not present

## 2020-06-08 DIAGNOSIS — Z87891 Personal history of nicotine dependence: Secondary | ICD-10-CM | POA: Diagnosis not present

## 2020-06-08 DIAGNOSIS — I129 Hypertensive chronic kidney disease with stage 1 through stage 4 chronic kidney disease, or unspecified chronic kidney disease: Secondary | ICD-10-CM | POA: Diagnosis not present

## 2020-06-11 ENCOUNTER — Other Ambulatory Visit: Payer: Self-pay

## 2020-06-11 ENCOUNTER — Encounter: Payer: Self-pay | Admitting: Family Medicine

## 2020-06-11 ENCOUNTER — Ambulatory Visit (INDEPENDENT_AMBULATORY_CARE_PROVIDER_SITE_OTHER): Payer: Medicare Other | Admitting: Family Medicine

## 2020-06-11 VITALS — Temp 97.1°F | Resp 16 | Ht 70.0 in

## 2020-06-11 DIAGNOSIS — F0280 Dementia in other diseases classified elsewhere without behavioral disturbance: Secondary | ICD-10-CM | POA: Diagnosis not present

## 2020-06-11 DIAGNOSIS — I872 Venous insufficiency (chronic) (peripheral): Secondary | ICD-10-CM | POA: Diagnosis not present

## 2020-06-11 DIAGNOSIS — R7989 Other specified abnormal findings of blood chemistry: Secondary | ICD-10-CM | POA: Diagnosis not present

## 2020-06-11 DIAGNOSIS — Z96651 Presence of right artificial knee joint: Secondary | ICD-10-CM | POA: Diagnosis not present

## 2020-06-11 DIAGNOSIS — E1142 Type 2 diabetes mellitus with diabetic polyneuropathy: Secondary | ICD-10-CM | POA: Diagnosis not present

## 2020-06-11 DIAGNOSIS — E1151 Type 2 diabetes mellitus with diabetic peripheral angiopathy without gangrene: Secondary | ICD-10-CM | POA: Diagnosis not present

## 2020-06-11 DIAGNOSIS — J449 Chronic obstructive pulmonary disease, unspecified: Secondary | ICD-10-CM | POA: Diagnosis not present

## 2020-06-11 DIAGNOSIS — G3183 Dementia with Lewy bodies: Secondary | ICD-10-CM | POA: Diagnosis not present

## 2020-06-11 DIAGNOSIS — J309 Allergic rhinitis, unspecified: Secondary | ICD-10-CM | POA: Diagnosis not present

## 2020-06-11 DIAGNOSIS — D631 Anemia in chronic kidney disease: Secondary | ICD-10-CM | POA: Diagnosis not present

## 2020-06-11 DIAGNOSIS — I890 Lymphedema, not elsewhere classified: Secondary | ICD-10-CM | POA: Diagnosis not present

## 2020-06-11 DIAGNOSIS — Z7901 Long term (current) use of anticoagulants: Secondary | ICD-10-CM | POA: Diagnosis not present

## 2020-06-11 DIAGNOSIS — Z86718 Personal history of other venous thrombosis and embolism: Secondary | ICD-10-CM | POA: Diagnosis not present

## 2020-06-11 DIAGNOSIS — M1712 Unilateral primary osteoarthritis, left knee: Secondary | ICD-10-CM | POA: Diagnosis not present

## 2020-06-11 DIAGNOSIS — Z87891 Personal history of nicotine dependence: Secondary | ICD-10-CM | POA: Diagnosis not present

## 2020-06-11 DIAGNOSIS — E1122 Type 2 diabetes mellitus with diabetic chronic kidney disease: Secondary | ICD-10-CM | POA: Diagnosis not present

## 2020-06-11 DIAGNOSIS — I129 Hypertensive chronic kidney disease with stage 1 through stage 4 chronic kidney disease, or unspecified chronic kidney disease: Secondary | ICD-10-CM | POA: Diagnosis not present

## 2020-06-11 DIAGNOSIS — N184 Chronic kidney disease, stage 4 (severe): Secondary | ICD-10-CM | POA: Diagnosis not present

## 2020-06-11 DIAGNOSIS — M109 Gout, unspecified: Secondary | ICD-10-CM | POA: Diagnosis not present

## 2020-06-11 DIAGNOSIS — G4733 Obstructive sleep apnea (adult) (pediatric): Secondary | ICD-10-CM | POA: Diagnosis not present

## 2020-06-11 DIAGNOSIS — M4802 Spinal stenosis, cervical region: Secondary | ICD-10-CM | POA: Diagnosis not present

## 2020-06-11 NOTE — Progress Notes (Signed)
Subjective:    [name], male    DOB: 1941/04/24, 79 y.o.   MRN: [id number]  Justin Shannon. is a 79 y.o. male presenting on 06/11/2020 for Hospitalization Follow-up (TIA)   HPI  HOSPITAL FOLLOW-UP VISIT  Hospital/Location: [facility] Date of Admission: 06/04/20 Date of Discharge: 06/06/20 Transitions of care telephone call: Not completed  Reason for Admission: Slurred speech Primary (+Secondary) Diagnosis: TIA  - Hospital H&P and Discharge Summary have been reviewed Recent history 12/9 saw neurology PA on 12/9 with gabapentin dose increase and then also started Lamictal on Monday. Then Monday morning 12/13 at time of his first dose of Gabapentin 100mg , his daughter identified that he had very slurred speech and he was not having confusion or disorientation but difficulty with getting speech to come out clearly, he was dysarthric. They went to Emergency Dept, did initial acute imaging with Head CT repeat negative acute intracranial, he was anticipated on an MRI and instead they admitted him and did a stroke work up. - Carotid US, ECHO negative. EEG was negative - Neurology in hospital, they were thinking that ultimately some symptoms could be dementia.- Dr Rory Percy neurology was thinking that it was likely to be progression of neurodegenerative process / Lewy Body Dementia. 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 Seems to occur when he is already asleep or just going to sleep or waking up from sleep. Often family will try to re orient him. He can physically act out what he is doing  They have looked into ALF and other locations for placement.  They are asking about Neuropsych referral  Followed by [facility], have been in contact with them. Awaiting call back after leaving hospital.   I have reviewed the discharge medication list, and have reconciled the  current and discharge medications today.   Current Outpatient Medications:  .  albuterol (VENTOLIN HFA) 108 (90 Base) MCG/ACT inhaler, Inhale 2 puffs into the lungs every 4 (four) hours as needed for wheezing., Disp: , Rfl:  .  allopurinol (ZYLOPRIM) 100 MG tablet, Take 100 mg by mouth daily., Disp: , Rfl:  .  atorvastatin (LIPITOR) 20 MG tablet, Take 1 tablet (20 mg total) by mouth at bedtime., Disp: 90 tablet, Rfl: 3 .  docusate sodium (COLACE) 100 MG capsule, Take 100 mg by mouth daily as needed for mild constipation or moderate constipation. , Disp: , Rfl:  .  fexofenadine (ALLEGRA) 180 MG tablet, Take 180 mg by mouth daily as needed for allergies. , Disp: , Rfl:  .  fluticasone (FLONASE) 50 MCG/ACT nasal spray, Place 2 sprays into both nostrils daily as needed for allergies. , Disp: , Rfl:  .  Fluticasone-Salmeterol (ADVAIR) 250-50 MCG/DOSE AEPB, Inhale 1 puff into the lungs 2 (two) times daily., Disp: , Rfl:  .  gabapentin (NEURONTIN) 100 MG capsule, Take 1 capsule (100 mg total) by mouth 2 (two) times daily., Disp: , Rfl:  .  lamoTRIgine (LAMICTAL) 25 MG tablet, Take 6 tablets (150 mg total) by mouth 2 (two) times daily Follow titration instructions given in office by your neurology., Disp: , Rfl:  .  levETIRAcetam (KEPPRA) 500 MG tablet, Take 500 mg by mouth 2 (two) times daily. , Disp: , Rfl:  .  Magnesium 250 MG TABS, Take 250 mg by mouth daily., Disp: , Rfl:  .  melatonin 5 MG TABS, Take 5 mg  by mouth at bedtime as needed (sleep)., Disp: , Rfl:  .  metoprolol succinate (TOPROL-XL) 50 MG 24 hr tablet, Take 50 mg by mouth daily. , Disp: , Rfl:  .  Multiple Vitamin (ONE-A-DAY MENS PO), Take 1 tablet by mouth daily. , Disp: , Rfl:  .  QUEtiapine (SEROQUEL) 25 MG tablet, Take 0.5 tablets (12.5 mg total) by mouth 2 (two) times daily., Disp: 30 tablet, Rfl: 0 .  XARELTO 10 MG TABS tablet, Take 10 mg by mouth daily before breakfast., Disp: , Rfl:    ------------------------------------------------------------------------- Social History   Tobacco Use  . Smoking status: Former Smoker    Packs/day: 1.00    Years: 2.00    Pack years: 2.00    Types: Cigarettes    Quit date: 06/23/1968    Years since quitting: 52.0  . Smokeless tobacco: Never Used  . Tobacco comment: 25 years ago quit  Vaping Use  . Vaping Use: Never used  Substance Use Topics  . Alcohol use: No  . Drug use: No    Review of Systems Per HPI unless specifically indicated above     Objective:    Temp (!) 97.1 F (36.2 C) (Temporal)   Resp 16   Ht 5\' 10"  (1.778 m)   BMI 42.59 kg/m   Wt Readings from Last 3 Encounters:  06/04/20 296 lb 12.8 oz (134.6 kg)  05/21/20 297 lb (134.7 kg)  05/02/20 260 lb (117.9 kg)    Physical Exam Vitals and nursing note reviewed.  Constitutional:      General: He is not in acute distress.    Appearance: He is well-developed and well-nourished. He is not diaphoretic.     Comments: Well-appearing, comfortable, cooperative  HENT:     Head: Normocephalic and atraumatic.     Mouth/Throat:     Mouth: Oropharynx is clear and moist.  Eyes:     General:        Right eye: No discharge.        Left eye: No discharge.     Conjunctiva/sclera: Conjunctivae normal.  Cardiovascular:     Rate and Rhythm: Normal rate.  Pulmonary:     Effort: Pulmonary effort is normal.  Musculoskeletal:        General: No edema.  Skin:    General: Skin is warm and dry.     Findings: No erythema or rash.  Neurological:     Mental Status: He is alert and oriented to person, place, and time.  Psychiatric:        Mood and Affect: Mood and affect normal.        Behavior: Behavior normal.     Comments: Well groomed, some good eye contact, he  Is not engaged with conversation today. Not participating       Results for orders placed or performed during the hospital encounter of 06/04/20  Resp Panel by RT-PCR (Flu A&B, Covid) Nasopharyngeal Swab    Specimen: Nasopharyngeal Swab; Nasopharyngeal(NP) swabs in vial transport medium  Result Value Ref Range   SARS Coronavirus 2 by RT PCR NEGATIVE NEGATIVE   Influenza A by PCR NEGATIVE NEGATIVE   Influenza B by PCR NEGATIVE NEGATIVE  Protime-INR  Result Value Ref Range   Prothrombin Time 17.2 (H) 11.4 - 15.2 seconds   INR 1.5 (H) 0.8 - 1.2  APTT  Result Value Ref Range   aPTT 50 (H) 24 - 36 seconds  CBC  Result Value Ref Range   WBC 5.2 4.0 -  10.5 K/uL   RBC 4.19 (L) 4.22 - 5.81 MIL/uL   Hemoglobin 9.5 (L) 13.0 - 17.0 g/dL   HCT 30.2 (L) 39.0 - 52.0 %   MCV 72.1 (L) 80.0 - 100.0 fL   MCH 22.7 (L) 26.0 - 34.0 pg   MCHC 31.5 30.0 - 36.0 g/dL   RDW 19.6 (H) 11.5 - 15.5 %   Platelets 184 150 - 400 K/uL   nRBC 0.0 0.0 - 0.2 %  Differential  Result Value Ref Range   Neutrophils Relative % 55 %   Neutro Abs 2.9 1.7 - 7.7 K/uL   Lymphocytes Relative 27 %   Lymphs Abs 1.4 0.7 - 4.0 K/uL   Monocytes Relative 13 %   Monocytes Absolute 0.7 0.1 - 1.0 K/uL   Eosinophils Relative 4 %   Eosinophils Absolute 0.2 0.0 - 0.5 K/uL   Basophils Relative 1 %   Basophils Absolute 0.0 0.0 - 0.1 K/uL   Immature Granulocytes 0 %   Abs Immature Granulocytes 0.01 0.00 - 0.07 K/uL  Comprehensive metabolic panel  Result Value Ref Range   Sodium 140 135 - 145 mmol/L   Potassium 4.9 3.5 - 5.1 mmol/L   Chloride 109 98 - 111 mmol/L   CO2 24 22 - 32 mmol/L   Glucose, Bld 86 70 - 99 mg/dL   BUN 45 (H) 8 - 23 mg/dL   Creatinine, Ser 1.79 (H) 0.61 - 1.24 mg/dL   Calcium 9.4 8.9 - 10.3 mg/dL   Total Protein 7.3 6.5 - 8.1 g/dL   Albumin 3.5 3.5 - 5.0 g/dL   AST 25 15 - 41 U/L   ALT 17 0 - 44 U/L   Alkaline Phosphatase 101 38 - 126 U/L   Total Bilirubin 0.5 0.3 - 1.2 mg/dL   GFR, Estimated 38 (L) >60 mL/min   Anion gap 7 5 - 15  Hemoglobin A1c  Result Value Ref Range   Hgb A1c MFr Bld 5.9 (H) 4.8 - 5.6 %   Mean Plasma Glucose 122.63 mg/dL  Lipid panel  Result Value Ref Range   Cholesterol 96 0 -  200 mg/dL   Triglycerides 81 <150 mg/dL   HDL 57 >40 mg/dL   Total CHOL/HDL Ratio 1.7 RATIO   VLDL 16 0 - 40 mg/dL   LDL Cholesterol 23 0 - 99 mg/dL  Comprehensive metabolic panel  Result Value Ref Range   Sodium 141 135 - 145 mmol/L   Potassium 5.2 (H) 3.5 - 5.1 mmol/L   Chloride 112 (H) 98 - 111 mmol/L   CO2 25 22 - 32 mmol/L   Glucose, Bld 95 70 - 99 mg/dL   BUN 44 (H) 8 - 23 mg/dL   Creatinine, Ser 1.86 (H) 0.61 - 1.24 mg/dL   Calcium 9.1 8.9 - 10.3 mg/dL   Total Protein 6.7 6.5 - 8.1 g/dL   Albumin 3.2 (L) 3.5 - 5.0 g/dL   AST 22 15 - 41 U/L   ALT 15 0 - 44 U/L   Alkaline Phosphatase 84 38 - 126 U/L   Total Bilirubin 0.4 0.3 - 1.2 mg/dL   GFR, Estimated 37 (L) >60 mL/min   Anion gap 4 (L) 5 - 15  Basic metabolic panel  Result Value Ref Range   Sodium 140 135 - 145 mmol/L   Potassium 5.0 3.5 - 5.1 mmol/L   Chloride 111 98 - 111 mmol/L   CO2 24 22 - 32 mmol/L   Glucose, Bld 92 70 - 99  mg/dL   BUN 47 (H) 8 - 23 mg/dL   Creatinine, Ser 1.94 (H) 0.61 - 1.24 mg/dL   Calcium 9.1 8.9 - 10.3 mg/dL   GFR, Estimated 35 (L) >60 mL/min   Anion gap 5 5 - 15  CBG monitoring, ED  Result Value Ref Range   Glucose-Capillary 82 70 - 99 mg/dL  ECHOCARDIOGRAM COMPLETE  Result Value Ref Range   Weight 4,748.8 oz   Height 70 in   BP 126/56 mmHg   Ao pk vel 1.49 m/s   AV Mean grad 4.5 mmHg   AV Peak grad 8.8 mmHg   Area-P 1/2 3.21 cm2      Assessment & Plan:   Problem List Items Addressed This Visit    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 Ultimately extensive neurological work up negative. Likely thought to be Lewy Body Dementia component. On seizure medication still. They are adjusting dosing.  Follow up with [facility] Neurology - may warrant Neuropsych evaluation in near future. They may submit this referral  Complex multiple medication conditions - primary concern now is with recent hospitalization and neurology ([facility]) having difficulty managing patient at home, has very good family caregiver support, daughter Justin Shannon who is CMA, and very involved with his care, however it is becoming very complex and may require further higher level of care. May need future placement or home caregiver support or other resources   refer CCM - CM, LCSW, Pharmacy  Elevated creatinine - will check BMET return to clinic later this week 1-2 days for repeat chemistry for Cr trend.  Orders Placed This Encounter  Procedures  . BASIC METABOLIC PANEL WITH GFR  . AMB Referral to [facility] Coordinaton    Referral Priority:   Routine    Referral Type:   Consultation    Referral Reason:   Care Coordination    Number of Visits Requested:   1     No orders of the defined types were placed in this encounter.   Follow up plan: Return if symptoms worsen or fail to improve.   Nobie Putnam, [facility] 06/11/2020, 11:35 AM

## 2020-06-11 NOTE — Patient Instructions (Addendum)
Thank you for coming to the office today.  Stay tuned for Chronic Care Management call to help coordinate all of the moving pieces.  Most likely dementia related hallucinations related to sleep, going to sleep or waking up most common   Please schedule a Follow-up Appointment to: Return if symptoms worsen or fail to improve.  If you have any other questions or concerns, please feel free to call the office or send a message through [place]. You may also schedule an earlier appointment if necessary.  Additionally, you may be receiving a survey about your experience at our office within a few days to 1 week by e-mail or mail. We value your feedback.  Nobie Putnam, DO [place]

## 2020-06-12 ENCOUNTER — Telehealth: Payer: Self-pay | Admitting: Primary Care

## 2020-06-12 ENCOUNTER — Telehealth: Payer: Self-pay

## 2020-06-12 DIAGNOSIS — E1122 Type 2 diabetes mellitus with diabetic chronic kidney disease: Secondary | ICD-10-CM | POA: Diagnosis not present

## 2020-06-12 DIAGNOSIS — G4733 Obstructive sleep apnea (adult) (pediatric): Secondary | ICD-10-CM | POA: Diagnosis not present

## 2020-06-12 DIAGNOSIS — E1151 Type 2 diabetes mellitus with diabetic peripheral angiopathy without gangrene: Secondary | ICD-10-CM | POA: Diagnosis not present

## 2020-06-12 DIAGNOSIS — M4802 Spinal stenosis, cervical region: Secondary | ICD-10-CM | POA: Diagnosis not present

## 2020-06-12 DIAGNOSIS — M109 Gout, unspecified: Secondary | ICD-10-CM | POA: Diagnosis not present

## 2020-06-12 DIAGNOSIS — Z87891 Personal history of nicotine dependence: Secondary | ICD-10-CM | POA: Diagnosis not present

## 2020-06-12 DIAGNOSIS — J309 Allergic rhinitis, unspecified: Secondary | ICD-10-CM | POA: Diagnosis not present

## 2020-06-12 DIAGNOSIS — I890 Lymphedema, not elsewhere classified: Secondary | ICD-10-CM | POA: Diagnosis not present

## 2020-06-12 DIAGNOSIS — I129 Hypertensive chronic kidney disease with stage 1 through stage 4 chronic kidney disease, or unspecified chronic kidney disease: Secondary | ICD-10-CM | POA: Diagnosis not present

## 2020-06-12 DIAGNOSIS — J449 Chronic obstructive pulmonary disease, unspecified: Secondary | ICD-10-CM | POA: Diagnosis not present

## 2020-06-12 DIAGNOSIS — Z86718 Personal history of other venous thrombosis and embolism: Secondary | ICD-10-CM | POA: Diagnosis not present

## 2020-06-12 DIAGNOSIS — Z96651 Presence of right artificial knee joint: Secondary | ICD-10-CM | POA: Diagnosis not present

## 2020-06-12 DIAGNOSIS — Z7901 Long term (current) use of anticoagulants: Secondary | ICD-10-CM | POA: Diagnosis not present

## 2020-06-12 DIAGNOSIS — D631 Anemia in chronic kidney disease: Secondary | ICD-10-CM | POA: Diagnosis not present

## 2020-06-12 DIAGNOSIS — M1712 Unilateral primary osteoarthritis, left knee: Secondary | ICD-10-CM | POA: Diagnosis not present

## 2020-06-12 DIAGNOSIS — I872 Venous insufficiency (chronic) (peripheral): Secondary | ICD-10-CM | POA: Diagnosis not present

## 2020-06-12 DIAGNOSIS — N184 Chronic kidney disease, stage 4 (severe): Secondary | ICD-10-CM | POA: Diagnosis not present

## 2020-06-12 DIAGNOSIS — E1142 Type 2 diabetes mellitus with diabetic polyneuropathy: Secondary | ICD-10-CM | POA: Diagnosis not present

## 2020-06-12 NOTE — Telephone Encounter (Signed)
Spoke with Mrs Justin Shannon to change appt to 2 pm  Tomorrow afternoon. This was confirmed.

## 2020-06-12 NOTE — Chronic Care Management (AMB) (Signed)
  Chronic Care Management   Note  06/12/2020 Name: [facility]. MRN: [id number] DOB: 1940/11/14  Justin Shannon. is a 79 y.o. year old male who is a primary care patient of [name], DO. I reached out to [facility]. by phone today in response to a referral sent by Mr. Justin Marhefka Jr.'s PCP, Dr. Parks Ranger      Justin Shannon's daughter Justin Shannon was given information about Chronic Care Management services today including:  1. CCM service includes personalized support from designated clinical staff supervised by his physician, including individualized plan of care and coordination with other care providers 2. 24/7 contact phone numbers for assistance for urgent and routine care needs. 3. Service will only be billed when office clinical staff spend 20 minutes or more in a month to coordinate care. 4. Only one practitioner may furnish and bill the service in a calendar month. 5. The patient may stop CCM services at any time (effective at the end of the month) by phone call to the office staff. 6. The patient will be responsible for cost sharing (co-pay) of up to 20% of the service fee (after annual deductible is met).  [name] daughter Justin Shannon agreed to services and verbal consent obtained.   Follow up plan: Telephone appointment with care management team member scheduled for:06/14/2020  Noreene Larsson, [address], [place], [place] [address] Direct Dial: [phone] [email or web address]: [email or web address]

## 2020-06-13 ENCOUNTER — Other Ambulatory Visit: Payer: Medicare Other | Admitting: Primary Care

## 2020-06-13 ENCOUNTER — Other Ambulatory Visit: Payer: Self-pay

## 2020-06-13 DIAGNOSIS — G3183 Dementia with Lewy bodies: Secondary | ICD-10-CM

## 2020-06-13 DIAGNOSIS — G4733 Obstructive sleep apnea (adult) (pediatric): Secondary | ICD-10-CM | POA: Diagnosis not present

## 2020-06-13 DIAGNOSIS — M109 Gout, unspecified: Secondary | ICD-10-CM | POA: Diagnosis not present

## 2020-06-13 DIAGNOSIS — Z96651 Presence of right artificial knee joint: Secondary | ICD-10-CM | POA: Diagnosis not present

## 2020-06-13 DIAGNOSIS — Z7901 Long term (current) use of anticoagulants: Secondary | ICD-10-CM | POA: Diagnosis not present

## 2020-06-13 DIAGNOSIS — E1122 Type 2 diabetes mellitus with diabetic chronic kidney disease: Secondary | ICD-10-CM

## 2020-06-13 DIAGNOSIS — I129 Hypertensive chronic kidney disease with stage 1 through stage 4 chronic kidney disease, or unspecified chronic kidney disease: Secondary | ICD-10-CM | POA: Diagnosis not present

## 2020-06-13 DIAGNOSIS — M7582 Other shoulder lesions, left shoulder: Secondary | ICD-10-CM | POA: Diagnosis not present

## 2020-06-13 DIAGNOSIS — S86811D Strain of other muscle(s) and tendon(s) at lower leg level, right leg, subsequent encounter: Secondary | ICD-10-CM | POA: Diagnosis not present

## 2020-06-13 DIAGNOSIS — M19122 Post-traumatic osteoarthritis, left elbow: Secondary | ICD-10-CM | POA: Diagnosis not present

## 2020-06-13 DIAGNOSIS — Z86718 Personal history of other venous thrombosis and embolism: Secondary | ICD-10-CM | POA: Diagnosis not present

## 2020-06-13 DIAGNOSIS — J449 Chronic obstructive pulmonary disease, unspecified: Secondary | ICD-10-CM | POA: Diagnosis not present

## 2020-06-13 DIAGNOSIS — Z515 Encounter for palliative care: Secondary | ICD-10-CM

## 2020-06-13 DIAGNOSIS — E1142 Type 2 diabetes mellitus with diabetic polyneuropathy: Secondary | ICD-10-CM | POA: Diagnosis not present

## 2020-06-13 DIAGNOSIS — E1151 Type 2 diabetes mellitus with diabetic peripheral angiopathy without gangrene: Secondary | ICD-10-CM | POA: Diagnosis not present

## 2020-06-13 DIAGNOSIS — D631 Anemia in chronic kidney disease: Secondary | ICD-10-CM | POA: Diagnosis not present

## 2020-06-13 DIAGNOSIS — I872 Venous insufficiency (chronic) (peripheral): Secondary | ICD-10-CM | POA: Diagnosis not present

## 2020-06-13 DIAGNOSIS — Z87891 Personal history of nicotine dependence: Secondary | ICD-10-CM | POA: Diagnosis not present

## 2020-06-13 DIAGNOSIS — Z6841 Body Mass Index (BMI) 40.0 and over, adult: Secondary | ICD-10-CM

## 2020-06-13 DIAGNOSIS — F0281 Dementia in other diseases classified elsewhere with behavioral disturbance: Secondary | ICD-10-CM

## 2020-06-13 DIAGNOSIS — M1712 Unilateral primary osteoarthritis, left knee: Secondary | ICD-10-CM | POA: Diagnosis not present

## 2020-06-13 DIAGNOSIS — N184 Chronic kidney disease, stage 4 (severe): Secondary | ICD-10-CM

## 2020-06-13 DIAGNOSIS — I890 Lymphedema, not elsewhere classified: Secondary | ICD-10-CM | POA: Diagnosis not present

## 2020-06-13 DIAGNOSIS — J309 Allergic rhinitis, unspecified: Secondary | ICD-10-CM | POA: Diagnosis not present

## 2020-06-13 DIAGNOSIS — M7580 Other shoulder lesions, unspecified shoulder: Secondary | ICD-10-CM | POA: Diagnosis not present

## 2020-06-13 DIAGNOSIS — M4802 Spinal stenosis, cervical region: Secondary | ICD-10-CM | POA: Diagnosis not present

## 2020-06-13 DIAGNOSIS — I739 Peripheral vascular disease, unspecified: Secondary | ICD-10-CM

## 2020-06-13 NOTE — Progress Notes (Signed)
[facility] Palliative Care Consult Note Telephone: [phone]  Fax: [phone]     Date of encounter: 06/13/20 PATIENT NAME: [place] [id number] [phone] (home)  DOB: Jun 08, 1941 MRN: [id number]  PRIMARY CARE PROVIDER:    [name], DO,  [address] [place] [address] [phone]  REFERRING PROVIDER:   [name], DO [address] [place],  [place] [address] [phone]  RESPONSIBLE PARTY:   Extended Emergency Contact Information Primary Emergency Contact: Adrien, Shankar, [place] [address] Johnnette Litter of [place] Phone: [phone] Work Phone: [phone] Mobile Phone: [phone] Relation: Daughter Secondary Emergency Contact: [name] Address: [address] [place]          [place], [place] [address] [place] Phone: [phone] Mobile Phone: [phone] Relation: Spouse  I met face to face with patient and family in the home. Palliative Care was asked to follow this patient by consultation request of Nobie Putnam * to help address advance care planning and goals of care. This is the initial visit.   ASSESSMENT AND RECOMMENDATIONS:   1. Advance Care Planning/Goals of Care: Goals include to maximize quality of life and symptom management. Our advance care planning conversation included a discussion about:     The value and importance of advance care planning   Experiences with loved ones who have been seriously ill or have died   Exploration of personal, cultural or spiritual beliefs that might influence medical decisions   Exploration of goals of care in the event of a sudden injury or illness   Identification of a healthcare agent - Daughter ID'd, needs formalized  Review  of an  advance directive document .- DNR and most  Decision not to resuscitate or to de-escalate disease focused treatments due to poor  prognosis discussed.  Patient does not have a will or durable power of atty yet. He names his daughter as HC-POA. Discussed need to get advance care plans and legal documents in order. Patient has capacity at this time but does have periodic hallucinations.   MOST for discussed. Daughter endorses difficulty of discussing ACP and advance directives for serious illness. Pt has been reticent to make a decision in past sessions with other doctors. I left a copy after reviewing the options, and we can discuss again on next visit.   2. Symptom Management:   Hallucinations: These are frequent, pt states they are terrifying but he knows they are hallucinations. They seem real. He has been started on several meds recently which have not eliminated . Literature given RE lewy body dementia and hallucinations.   Seizure disorder: Has had in past and now is being assessed for new seizure activity in the context of Lewy bodies dementia. On keppra, Lamictal, neurontin, has been on seroquel but daughter stopped it. Has had some untoward side effects with neurological drugs.   Caregiver strain: Patient lives with daughter and her family. He requires nearly total care for toileting and bathing. Pt wife is also in need of care, which she is getting with another daughter in her home. Information given for [facility], seeking help from elder atty for paying for care givers. Recommend websites such as AD and PD sites for support.  3. Follow up Palliative Care Visit: Palliative care will continue to follow for goals of care clarification and symptom management. Return 6 weeks or prn.  4. Family /Caregiver/Community  Supports: Lives with daughter and her family.   5. Cognitive / Functional decline: A and O x 2, endorses hallucinations. Endorses dependence in all adls and iadls.Care needs are increasing.  I spent 75 minutes providing this consultation,  from 1400 to 1515. More than 50% of the time in this  consultation was spent coordinating communication.   CODE STATUS: FULL  PPS: 40%  HOSPICE ELIGIBILITY/DIAGNOSIS: TBD  Subjective:  CHIEF COMPLAINT: hallucinations  HISTORY OF PRESENT ILLNESS:  Shawne Eskelson. is a 79 y.o. year old male  with h/o seizures dx 13 years ago, CVA, TIA, Parkinsonism, dementia with hallucinations, Lewy Bodies, DM, obesity, immobility.His most troubling issue is the hallucinations. He describes them as real at the time although can discuss afterwards and knows these are hallucinations. Also has disorientation coming out of sleep.  We are asked to consult around advance care planning and complex medical decision making.    Review and summarization of old Epic records shows or history from other than patient. Review of MRI of head 06/05/20. Review of case with family member Daughter [name]  History obtained from review of EMR, discussion with primary team, and  interview with family, caregiver  and/or Mr. Battershell. Records reviewed and summarized above.   CURRENT PROBLEM LIST:  Patient Active Problem List   Diagnosis Date Noted  . Slurred speech 06/04/2020  . CKD (chronic kidney disease) stage 3, GFR 30-59 ml/min (HCC) 06/04/2020  . Essential hypertension 06/04/2020  . Hyperlipidemia 06/04/2020  . Lewy body dementia ([place]) 05/21/2020  . Hyperlipidemia associated with type 2 diabetes mellitus ([place]) 05/21/2020  . Status post total right knee replacement 01/22/2020  . Anemia of chronic kidney failure, stage 3 (moderate) ([place]) 01/16/2020  . Total knee replacement status 11/02/2019  . History of total knee arthroplasty 11/02/2019  . PAD (peripheral artery disease) ([place]) 10/13/2019  . Benign prostatic hyperplasia   . Seizures ([place])   . Anemia due to stage 4 chronic kidney disease ([place]) 06/24/2019  . Generalized weakness 06/24/2019  . Anemia in chronic kidney disease (CODE) 06/24/2019  . Asthenia 06/24/2019  . Primary osteoarthritis of right knee  04/03/2019  . Ankle ulcer ([place]) 05/08/2018  . Use of cane as ambulatory aid 04/27/2018  . Dependence on other enabling machines and devices 04/27/2018  . Type 2 diabetes mellitus with stage 4 chronic kidney disease, without long-term current use of insulin ([place]) 06/11/2017  . Lymphedema 05/04/2016  . Chronic venous insufficiency 05/04/2016  . Toe sprain 01/24/2016  . Morbid obesity with body mass index of 40.0-44.9 in adult [facility]) 05/24/2014  . Body mass index (BMI)40.0-44.9, adult 05/24/2014  . OSA on CPAP 01/10/2014  . CKD (chronic kidney disease), stage IV ([place]) 11/22/2013  . Allergic rhinitis 11/06/2013  . Benign hypertension with CKD (chronic kidney disease) stage IV ([place]) 11/06/2013  . Epilepsy ([place]) 11/06/2013  . Gout 11/06/2013  . History of DVT (deep vein thrombosis) 11/06/2013  . Chronic deep vein thrombosis (DVT) of proximal vein of lower extremity ([place]) 11/06/2013  . 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 Personal history of other venous thrombosis and embolism 11/06/2013   PAST MEDICAL HISTORY:  Active Ambulatory Problems    Diagnosis Date Noted  . Allergic rhinitis 11/06/2013  . Benign hypertension with CKD (chronic kidney disease) stage IV ([place]) 11/06/2013  . CKD (chronic kidney disease), stage IV ([place]) 11/22/2013  . Epilepsy ([place]) 11/06/2013  . Gout 11/06/2013  . History of  DVT (deep vein thrombosis) 11/06/2013  . OSA on CPAP 01/10/2014  . Toe sprain 01/24/2016  . Lymphedema 05/04/2016  . Chronic venous insufficiency 05/04/2016  . Morbid obesity with body mass index of 40.0-44.9 in adult (HCC) 05/24/2014  . 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) 11/06/2013  . Type 2 diabetes mellitus with stage 4 chronic kidney disease, without long-term current use of insulin (HCC) 06/11/2017  . Use of cane as ambulatory aid 04/27/2018  . Primary osteoarthritis of  right knee 04/03/2019  . Anemia due to stage 4 chronic kidney disease (HCC) 06/24/2019  . Generalized weakness 06/24/2019  . Benign prostatic hyperplasia   . Seizures (HCC)   . PAD (peripheral artery disease) (HCC) 10/13/2019  . Total knee replacement status 11/02/2019  . Anemia in chronic kidney disease (CODE) 06/24/2019  . 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 (HCC) 11/06/2013  . Personal history of other venous thrombosis and embolism 11/06/2013  . Anemia of chronic kidney failure, stage 3 (moderate) (HCC) 01/16/2020  . Asthenia 06/24/2019  . Body mass index (BMI)40.0-44.9, adult 05/24/2014  . History of total knee arthroplasty 11/02/2019  . Status post total right knee replacement 01/22/2020  . Dependence on other enabling machines and devices 04/27/2018  . Ankle ulcer (HCC) 05/08/2018  . Lewy body dementia (HCC) 05/21/2020  . Hyperlipidemia associated with type 2 diabetes mellitus (HCC) 05/21/2020  . Slurred speech 06/04/2020  . CKD (chronic kidney disease) stage 3, GFR 30-59 ml/min (HCC) 06/04/2020  . Essential hypertension 06/04/2020  . Hyperlipidemia 06/04/2020   Resolved Ambulatory Problems    Diagnosis Date Noted  . Health care maintenance 12/11/2014  . Morbid (severe) obesity due to excess calories (HCC) 05/24/2014  . Nocturnal seizures (HCC) 11/06/2013  . Venous ulcer (HCC) 04/19/2017  . Venous ulcer of ankle, right (HCC) 05/08/2018  . Pneumonia due to COVID-19 virus 06/24/2019  . Respiratory failure with hypoxia (HCC) 06/24/2019  . Anemia of chronic renal failure, stage 3a (HCC)   . Hyperkalemia   . Acute kidney injury superimposed on CKD (HCC)   . Patellar tendon rupture, right, initial encounter 12/05/2019  . Acute renal failure syndrome (HCC) 01/16/2020  . Rupture of patellar tendon 12/05/2019  . Pneumonia due to coronavirus disease 2019 06/24/2019  . Injury of toe 01/24/2016   Past Medical History:   Diagnosis Date  . Arthritis   . Bilateral swelling of feet   . BPH (benign prostatic hyperplasia)   . Chronic kidney disease   . DVT (deep venous thrombosis) (HCC)   . History of gout   . Sleep apnea   . Tendonitis of foot    SOCIAL HX:  Social History   Tobacco Use  . Smoking status: Former Smoker    Packs/day: 1.00    Years: 2.00    Pack years: 2.00    Types: Cigarettes    Quit date: 06/23/1968    Years since quitting: 52.0  . Smokeless tobacco: Never Used  . Tobacco comment: 25 years ago quit  Substance Use Topics  . Alcohol use: No   FAMILY HX:  Family History  Problem Relation Age of Onset  . Arthritis Mother   . Hypertension Mother   . Diabetes Brother      ALLERGIES:  Allergies  Allergen Reactions  . Shellfish Allergy Anaphylaxis and Other (See Comments)    gout  . Iodinated Diagnostic Agents Other (See Comments)    Chronic kidney   disease 3b  . Nsaids Other (See Comments)    Chronic kidney disease 3b  . Rivastigmine Other (See Comments)    Side effect and not a true allergy     PERTINENT MEDICATIONS:  Outpatient Encounter Medications as of 06/13/2020  Medication Sig  . albuterol (VENTOLIN HFA) 108 (90 Base) MCG/ACT inhaler Inhale 2 puffs into the lungs every 4 (four) hours as needed for wheezing.  Marland Kitchen allopurinol (ZYLOPRIM) 100 MG tablet Take 100 mg by mouth daily.  Marland Kitchen atorvastatin (LIPITOR) 20 MG tablet Take 1 tablet (20 mg total) by mouth at bedtime.  . docusate sodium (COLACE) 100 MG capsule Take 100 mg by mouth daily as needed for mild constipation or moderate constipation.   . fexofenadine (ALLEGRA) 180 MG tablet Take 180 mg by mouth daily as needed for allergies.   . fluticasone (FLONASE) 50 MCG/ACT nasal spray Place 2 sprays into both nostrils daily as needed for allergies.   . Fluticasone-Salmeterol (ADVAIR) 250-50 MCG/DOSE AEPB Inhale 1 puff into the lungs 2 (two) times daily.  Marland Kitchen gabapentin (NEURONTIN) 100 MG capsule Take 1 capsule (100 mg total)  by mouth 2 (two) times daily.  Marland Kitchen lamoTRIgine (LAMICTAL) 25 MG tablet Take 6 tablets (150 mg total) by mouth 2 (two) times daily Follow titration instructions given in office by your neurology.  . levETIRAcetam (KEPPRA) 500 MG tablet Take 500 mg by mouth 2 (two) times daily.   . Magnesium 250 MG TABS Take 250 mg by mouth daily.  . melatonin 5 MG TABS Take 5 mg by mouth at bedtime as needed (sleep).  . metoprolol succinate (TOPROL-XL) 50 MG 24 hr tablet Take 50 mg by mouth daily.   . Multiple Vitamin (ONE-A-DAY MENS PO) Take 1 tablet by mouth daily.   . QUEtiapine (SEROQUEL) 25 MG tablet Take 0.5 tablets (12.5 mg total) by mouth 2 (two) times daily.  Alveda Reasons 10 MG TABS tablet Take 10 mg by mouth daily before breakfast.   No facility-administered encounter medications on file as of 06/13/2020.    Objective: ROS  General: NAD ENMT: denies dysphagia Pulmonary: denies cough, denies increased SOB Abdomen: endorses fair to good appetite, endorses occ constipation, endorses continence of bowel GU: denies dysuria, endorses continence of urine MSK:  endorses ROM limitations, no falls reported Skin: denies rashes or wounds Neurological: endorses weakness, denies pain, endorses night terrors, and insomnia,  Psych: Endorses hallucinations  Heme/lymph/immuno: denies bruises, abnormal bleeding  Physical Exam: Current and past weights:296 lbs per Epic Constitutional:  NAD General: frail appearing, obese  EYES: anicteric sclera,lids intact, no discharge  ENMT: intact hearing,oral mucous membranes moist, CV:  no LE edema Pulmonary:no increased work of breathing, no cough, no audible wheezes, room air Abdomen: intake 50-75%, no ascites MSK: decreased ROM in all extremities, no contractures of LE, non ambulatory Skin: warm and dry, no rashes or wounds on visible skin Neuro: Generalized weakness, ++ cognitive impairment, hand tremors, rigidity of trunk, w/c bound Psych: non-anxious affect, A and O  x 2 Hem/lymph/immuno: no widespread bruising  Thank you for the opportunity to participate in the care of Mr. Demaree.  The palliative care team will continue to follow. Please call our office at [phone] if we can be of additional assistance.  Jason Coop, NP , DNP, MPH, AGPCNP-BC, ACHPN  COVID-19 PATIENT SCREENING TOOL  Person answering questions: ____________daughter______ _____   1.  Is the patient or any family member in the home showing any signs or symptoms regarding respiratory infection?  Person with Symptom- __________NA_________________  a. Fever                                                                          Yes___ No___          ___________________  b. Shortness of breath                                                    Yes___ No___          ___________________ c. Cough/congestion                                       Yes___  No___         ___________________ d. Body aches/pains                                                         Yes___ No___        ____________________ e. Gastrointestinal symptoms (diarrhea, nausea)           Yes___ No___        ____________________  2. Within the past 14 days, has anyone living in the home had any contact with someone with or under investigation for COVID-19?    Yes___ No_X_   Person __________________

## 2020-06-14 ENCOUNTER — Telehealth: Payer: Medicare Other | Admitting: General Practice

## 2020-06-14 ENCOUNTER — Ambulatory Visit: Payer: Medicare Other | Admitting: Licensed Clinical Social Worker

## 2020-06-14 ENCOUNTER — Ambulatory Visit: Payer: Self-pay | Admitting: General Practice

## 2020-06-14 ENCOUNTER — Telehealth: Payer: Self-pay | Admitting: Family Medicine

## 2020-06-14 DIAGNOSIS — N184 Chronic kidney disease, stage 4 (severe): Secondary | ICD-10-CM | POA: Diagnosis not present

## 2020-06-14 DIAGNOSIS — I129 Hypertensive chronic kidney disease with stage 1 through stage 4 chronic kidney disease, or unspecified chronic kidney disease: Secondary | ICD-10-CM | POA: Diagnosis not present

## 2020-06-14 DIAGNOSIS — F0280 Dementia in other diseases classified elsewhere without behavioral disturbance: Secondary | ICD-10-CM

## 2020-06-14 DIAGNOSIS — G3183 Dementia with Lewy bodies: Secondary | ICD-10-CM

## 2020-06-14 DIAGNOSIS — Z86718 Personal history of other venous thrombosis and embolism: Secondary | ICD-10-CM | POA: Diagnosis not present

## 2020-06-14 DIAGNOSIS — E1151 Type 2 diabetes mellitus with diabetic peripheral angiopathy without gangrene: Secondary | ICD-10-CM | POA: Diagnosis not present

## 2020-06-14 DIAGNOSIS — Z96651 Presence of right artificial knee joint: Secondary | ICD-10-CM | POA: Diagnosis not present

## 2020-06-14 DIAGNOSIS — Z87891 Personal history of nicotine dependence: Secondary | ICD-10-CM | POA: Diagnosis not present

## 2020-06-14 DIAGNOSIS — J309 Allergic rhinitis, unspecified: Secondary | ICD-10-CM | POA: Diagnosis not present

## 2020-06-14 DIAGNOSIS — R569 Unspecified convulsions: Secondary | ICD-10-CM | POA: Diagnosis not present

## 2020-06-14 DIAGNOSIS — I890 Lymphedema, not elsewhere classified: Secondary | ICD-10-CM | POA: Diagnosis not present

## 2020-06-14 DIAGNOSIS — E1122 Type 2 diabetes mellitus with diabetic chronic kidney disease: Secondary | ICD-10-CM

## 2020-06-14 DIAGNOSIS — D631 Anemia in chronic kidney disease: Secondary | ICD-10-CM | POA: Diagnosis not present

## 2020-06-14 DIAGNOSIS — M4802 Spinal stenosis, cervical region: Secondary | ICD-10-CM | POA: Diagnosis not present

## 2020-06-14 DIAGNOSIS — G4733 Obstructive sleep apnea (adult) (pediatric): Secondary | ICD-10-CM | POA: Diagnosis not present

## 2020-06-14 DIAGNOSIS — Z7901 Long term (current) use of anticoagulants: Secondary | ICD-10-CM | POA: Diagnosis not present

## 2020-06-14 DIAGNOSIS — M109 Gout, unspecified: Secondary | ICD-10-CM | POA: Diagnosis not present

## 2020-06-14 DIAGNOSIS — J449 Chronic obstructive pulmonary disease, unspecified: Secondary | ICD-10-CM | POA: Diagnosis not present

## 2020-06-14 DIAGNOSIS — E1142 Type 2 diabetes mellitus with diabetic polyneuropathy: Secondary | ICD-10-CM | POA: Diagnosis not present

## 2020-06-14 DIAGNOSIS — I872 Venous insufficiency (chronic) (peripheral): Secondary | ICD-10-CM | POA: Diagnosis not present

## 2020-06-14 DIAGNOSIS — M1712 Unilateral primary osteoarthritis, left knee: Secondary | ICD-10-CM | POA: Diagnosis not present

## 2020-06-14 NOTE — Chronic Care Management (AMB) (Signed)
Chronic Care Management   Initial Visit Note  06/14/2020 Name: [facility]. MRN: [id number] DOB: 14-May-1941  Referred by: [name], DO Reason for referral : Chronic Care Management (RNCM Chronic Disease Management and Care Coordination- Initial Outreach)   [facility] [name] is a 79 y.o. year old male who is a primary care patient of [name], DO. The CCM team was consulted for assistance with chronic disease management and care coordination needs related to HTN, DMII and Dementia  Review of patient status, including review of consultants reports, relevant laboratory and other test results, and collaboration with appropriate care team members and the patient's provider was performed as part of comprehensive patient evaluation and provision of chronic care management services.    SDOH (Social Determinants of Health) assessments performed: Yes See Care Plan activities for detailed interventions related to SDOH  SDOH Interventions   Flowsheet Row Most Recent Value  SDOH Interventions   Physical Activity Interventions Other (Comments)  [limited mobility]  Social Connections Interventions Other (Comment)  [good family support, tallks to brother on the phone in [place] every other day]       Medications: Outpatient Encounter Medications as of 06/14/2020  Medication Sig  . albuterol (VENTOLIN HFA) 108 (90 Base) MCG/ACT inhaler Inhale 2 puffs into the lungs every 4 (four) hours as needed for wheezing.  Marland Kitchen allopurinol (ZYLOPRIM) 100 MG tablet Take 100 mg by mouth daily.  Marland Kitchen atorvastatin (LIPITOR) 20 MG tablet Take 1 tablet (20 mg total) by mouth at bedtime.  . docusate sodium (COLACE) 100 MG capsule Take 100 mg by mouth daily as needed for mild constipation or moderate constipation.   . fexofenadine (ALLEGRA) 180 MG tablet Take 180 mg by mouth daily as needed for allergies.   . fluticasone (FLONASE) 50 MCG/ACT nasal spray Place 2 sprays into both nostrils  daily as needed for allergies.   . Fluticasone-Salmeterol (ADVAIR) 250-50 MCG/DOSE AEPB Inhale 1 puff into the lungs 2 (two) times daily.  Marland Kitchen gabapentin (NEURONTIN) 100 MG capsule Take 1 capsule (100 mg total) by mouth 2 (two) times daily.  Marland Kitchen lamoTRIgine (LAMICTAL) 25 MG tablet Take 6 tablets (150 mg total) by mouth 2 (two) times daily Follow titration instructions given in office by your neurology.  . levETIRAcetam (KEPPRA) 500 MG tablet Take 500 mg by mouth 2 (two) times daily.   . Magnesium 250 MG TABS Take 250 mg by mouth daily.  . melatonin 5 MG TABS Take 5 mg by mouth at bedtime as needed (sleep).  . metoprolol succinate (TOPROL-XL) 50 MG 24 hr tablet Take 50 mg by mouth daily.   . Multiple Vitamin (ONE-A-DAY MENS PO) Take 1 tablet by mouth daily.   . QUEtiapine (SEROQUEL) 25 MG tablet Take 0.5 tablets (12.5 mg total) by mouth 2 (two) times daily.  Alveda Reasons 10 MG TABS tablet Take 10 mg by mouth daily before breakfast.   No facility-administered encounter medications on file as of 06/14/2020.     Objective:   Patient Care Plan: RNCM: Lewey Body Dementia (Adult)    Problem Identified: RNCM: Management of Lewey Body Dementia   Priority: High    Long-Range Goal: RNCM: Behavior Symptoms Management   Expected End Date: 10/12/2020  Priority: High  Note:   CARE PLAN ENTRY (see longtitudinal plan of care for additional care plan information)  Current Barriers:  . Chronic Disease Management support, education, and care coordination needs related to Dementia  Clinical Goal(s) related to Dementia: Lewey Body Dementia  Over the next 120 days, patient will:  . Work with the care management team to address educational, disease management, and care coordination needs  . Begin or continue self health monitoring activities as directed today  support and education for patient and caregiver with diagnosis of Lewey body dementia and decline in health . Call provider office for new or worsened  signs and symptoms New or worsened symptom related to dementia and changes in conditions . Call care management team with questions or concerns . Verbalize basic understanding of patient centered plan of care established today  Interventions related to Dementia:  . Evaluation of current treatment plans and patient's adherence to plan as established by provider.  06-14-2020: The patient saw pcp on 06-11-2020. Referral for CCM team support. The patient has been living with the daughter and her family since October. His wife is actively taking radiation treatments at this time and due to decline in the patients health the daughter has moved the patient in with her and her family. The patient saw neurologist today and further testing will be done by an in-home study and a sub referral for additional diagnosis and treatment options.  . Assessed patient understanding of disease states.  The daughter verbalized the patient does not fully understand what is going on but goes to appointments, eats well, takes medications, and is working with OT/PT in the home. An aide come 2 times a week and bathes the patient.   . Assessed patient's education and care coordination needs.  The daughter spoke to the LCSW earlier today and care guide referral made. RNCM reviewed the role of the CCM team and support of the CCM team in the help of the patient and caregiver meeting the healthy and wellness needs of the patient.  . Provided disease specific education to patient.  Education given on resources available and how to contact the RNCM. Education on follow up calls and reaching out to the [facility] for new concerns before next outreach.  Nash Dimmer with appropriate clinical care team members regarding patient needs.  The patient is currently working with the LCSW. Will refer the pharmacist for help with medication cost constraints.  . - action plan for worsening symptoms mutually developed . - Financial risk analyst  provided . - symptom review completed . -safety reviewed- the family uses baby monitors and knows when the patient attempts to get out of bed . -medication safety reviewed- the daughter given the patient his medications and manages his medications.   Patient Self Care Activities related to Dementia:  . Patient is unable to independently self-manage chronic health conditions  Follow up in the next 30 to 60 days    Task: RNCM: Develop Strategies to Manage Behavior   Note:   Care Management Activities:    - action plan for worsening symptoms mutually developed - community resource information provided - symptom review completed    Notes: The patient is going to have further testing to evaluate dementia- saw neurologist today       Patient Care Plan: RNCM: Management of Diabetes Type 2 (Adult)    Problem Identified: RNCM: Glycemic Management (Diabetes, Type 2)   Priority: Medium    Goal: RNCM: Management of DM   Priority: Medium  Note:   Objective:  Lab Results  Component Value Date   HGBA1C 5.9 (H) 06/05/2020 .   Lab Results  Component Value Date   CREATININE 1.94 (H) 06/06/2020   CREATININE 1.86 (H) 06/06/2020   CREATININE 1.79 (  H) 06/04/2020 .   Marland Kitchen No results found for: EGFR Current Barriers:  Marland Kitchen Knowledge Deficits related to basic Diabetes pathophysiology and self care/management . Knowledge Deficits related to medications used for management of diabetes . Limited Social Support . Unable to independently manage DM as evidence of advancing dementia and inability to manage care without the help of his daughter . Unable to self administer medications as prescribed . Unable to perform ADLs independently . Unable to perform IADLs independently Case Manager Clinical Goal(s):  Marland Kitchen Over the next 120 days, patient will demonstrate improved adherence to prescribed treatment plan for diabetes self care/management as evidenced by:  . daily monitoring and recording of CBG   . adherence to ADA/ carb modified diet . adherence to prescribed medication regimen Interventions:  . Provided education to patient about basic DM disease process . Reviewed medications with patient and discussed importance of medication adherence . Discussed plans with patient for ongoing care management follow up and provided patient with direct contact information for care management team . Provided patient with written educational materials related to hypo and hyperglycemia and importance of correct treatment . Advised patient, providing education and rationale, to check cbg daily and record, calling pcp for findings outside established parameters.   . Review of patient status, including review of consultants reports, relevant laboratory and other test results, and medications completed. . - A1C testing facilitated . - barriers to adherence to treatment plan identified . - blood glucose readings reviewed . - resources required to improve adherence to care identified . - self-awareness of signs/symptoms of hypo or hyperglycemia encouraged Patient Goals/Self-Care Activities . Over the next 120 days, patient will:  - UNABLE to independently manage DM Self administers oral medications as prescribed Attends all scheduled provider appointments Checks blood sugars as prescribed and utilize hyper and hypoglycemia protocol as needed Adheres to prescribed ADA/carb modified Follow Up Plan: Telephone follow up appointment with care management team member scheduled for: 07-19-2020 at 0345 pm   Task: RNCM: Alleviate Barriers to Glycemic Management   Note:   Care Management Activities:    - A1C testing facilitated - barriers to adherence to treatment plan identified - blood glucose readings reviewed - resources required to improve adherence to care identified - self-awareness of signs/symptoms of hypo or hyperglycemia encouraged      Patient Care Plan: RNCM:Hypertension (Adult)    Problem  Identified: Hypertension (Hypertension)     Goal: RNCM: Management of HTN   Expected End Date: 10/12/2020  Priority: Medium  Note:   Objective:  . Last practice recorded BP readings:  BP Readings from Last 3 Encounters:  06/06/20 (!) 149/57  05/21/20 (!) 124/48  05/02/20 (!) 156/82 .   Marland Kitchen Most recent eGFR/CrCl: No results found for: EGFR  No components found for: CRCL Current Barriers:  Marland Kitchen Knowledge Deficits related to basic understanding of hypertension pathophysiology and self care management . Knowledge Deficits related to understanding of medications prescribed for management of hypertension . 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 Unable to independently HTN as evidence of decline in health and having to move in with daughter to assist with care.  Leodis Liverpool social connections . Unable to perform ADLs independently . Unable to perform IADLs independently Case Manager Clinical Goal(s):  Marland Kitchen Over the next 120 days, patient will verbalize understanding of plan for hypertension management . Over the next 120 days, patient will demonstrate improved adherence to prescribed treatment plan for hypertension as evidenced by taking  all medications as prescribed, monitoring and recording blood pressure as directed, adhering to low sodium/DASH diet . Over the next 120 days, patient will demonstrate improved health management independence as evidenced by checking blood pressure as directed and notifying PCP if SBP>160 or DBP > 90, taking all medications as prescribe, and adhering to a low sodium diet as discussed. Interventions:  Marland Kitchen UNABLE to independently:manage health and wellness due to decline in memory and recent moving in with his daughter to assist with his care . Evaluation of current treatment plan related to hypertension self management and patient's adherence to plan as established by provider. . Provided education to patient re: stroke prevention, s/s of  heart attack and stroke, DASH diet, complications of uncontrolled blood pressure . Reviewed medications with patient and discussed importance of compliance. Pharmacist referral for questions and financial help for Xarelto  . Discussed plans with patient for ongoing care management follow up and provided patient with direct contact information for care management team . Advised patient, providing education and rationale, to monitor blood pressure daily and record, calling PCP for findings outside established parameters.  . - blood pressure trends reviewed . - depression screen reviewed . - home or ambulatory blood pressure monitoring encouraged Patient Goals/Self-Care Activities . Over the next 120 days, patient will:  - Self administers medications as prescribed Checks BP and records as discussed Follows a low sodium diet/DASH diet Follow Up Plan: Telephone follow up appointment with care management team member scheduled for: 07-19-2020 at 3:45pm   Task: RNCM: Identify and Monitor Blood Pressure Elevation   Note:   Care Management Activities:    - blood pressure trends reviewed - depression screen reviewed - home or ambulatory blood pressure monitoring encouraged        Mr. Amory was given information about Chronic Care Management services today including:  1. CCM service includes personalized support from designated clinical staff supervised by his physician, including individualized plan of care and coordination with other care providers 2. 24/7 contact phone numbers for assistance for urgent and routine care needs. 3. Service will only be billed when office clinical staff spend 20 minutes or more in a month to coordinate care. 4. Only one practitioner may furnish and bill the service in a calendar month. 5. The patient may stop CCM services at any time (effective at the end of the month) by phone call to the office staff. 6. The patient will be responsible for cost sharing  (co-pay) of up to 20% of the service fee (after annual deductible is met).  Patient agreed to services and verbal consent obtained.   Plan:   Telephone follow up appointment with care management team member scheduled for: 07-19-2020 at [place] pm  Noreene Larsson RN, MSN, [facility] Mobile: [phone]

## 2020-06-14 NOTE — Chronic Care Management (AMB) (Signed)
Chronic Care Management    Clinical Social Work General Note  06/14/2020 Name: [facility]. MRN: [id number] DOB: 1940-07-28  Justin Shannon. is a 79 y.o. year old male who is a primary care patient of [name], DO. The CCM was consulted to assist the patient with Level of Care Concerns.   Mr. Royster was given information about Chronic Care Management services today including:  1. CCM service includes personalized support from designated clinical staff supervised by his physician, including individualized plan of care and coordination with other care providers 2. 24/7 contact phone numbers for assistance for urgent and routine care needs. 3. Service will only be billed when office clinical staff spend 20 minutes or more in a month to coordinate care. 4. Only one practitioner may furnish and bill the service in a calendar month. 5. The patient may stop CCM services at any time (effective at the end of the month) by phone call to the office staff. 6. The patient will be responsible for cost sharing (co-pay) of up to 20% of the service fee (after annual deductible is met).  Patient agreed to services and verbal consent obtained.   Review of patient status, including review of consultants reports, relevant laboratory and other test results, and collaboration with appropriate care team members and the patient's provider was performed as part of comprehensive patient evaluation and provision of chronic care management services.    SDOH (Social Determinants of Health) assessments and interventions performed:  Yes    Outpatient Encounter Medications as of 06/14/2020  Medication Sig  . albuterol (VENTOLIN HFA) 108 (90 Base) MCG/ACT inhaler Inhale 2 puffs into the lungs every 4 (four) hours as needed for wheezing.  Marland Kitchen allopurinol (ZYLOPRIM) 100 MG tablet Take 100 mg by mouth daily.  Marland Kitchen atorvastatin (LIPITOR) 20 MG tablet Take 1 tablet (20 mg total) by mouth at bedtime.  .  docusate sodium (COLACE) 100 MG capsule Take 100 mg by mouth daily as needed for mild constipation or moderate constipation.   . fexofenadine (ALLEGRA) 180 MG tablet Take 180 mg by mouth daily as needed for allergies.   . fluticasone (FLONASE) 50 MCG/ACT nasal spray Place 2 sprays into both nostrils daily as needed for allergies.   . Fluticasone-Salmeterol (ADVAIR) 250-50 MCG/DOSE AEPB Inhale 1 puff into the lungs 2 (two) times daily.  Marland Kitchen gabapentin (NEURONTIN) 100 MG capsule Take 1 capsule (100 mg total) by mouth 2 (two) times daily.  Marland Kitchen lamoTRIgine (LAMICTAL) 25 MG tablet Take 6 tablets (150 mg total) by mouth 2 (two) times daily Follow titration instructions given in office by your neurology.  . levETIRAcetam (KEPPRA) 500 MG tablet Take 500 mg by mouth 2 (two) times daily.   . Magnesium 250 MG TABS Take 250 mg by mouth daily.  . melatonin 5 MG TABS Take 5 mg by mouth at bedtime as needed (sleep).  . metoprolol succinate (TOPROL-XL) 50 MG 24 hr tablet Take 50 mg by mouth daily.   . Multiple Vitamin (ONE-A-DAY MENS PO) Take 1 tablet by mouth daily.   . QUEtiapine (SEROQUEL) 25 MG tablet Take 0.5 tablets (12.5 mg total) by mouth 2 (two) times daily.  Alveda Reasons 10 MG TABS tablet Take 10 mg by mouth daily before breakfast.   No facility-administered encounter medications on file as of 06/14/2020.    Goals Addressed    . Keeping Myself/Loved One Safe-Dementia       Timeframe:  Long-Range Goal Priority:  Medium Start Date:  06/14/20  Expected End Date: 09/12/20                    Follow Up Date- 90 days from 06/14/20   - avoid busy places that are confusing such as the shopping malls - check hot water heater temperature, turn down if too hot - get rid of poisonous plants - keep medicine where it is safe or locked - keep power tools, knives and cleaning products in a safe place - put deadbolts either high or low on outside doors - put car keys out of sight - remove  knobs from stove and oven - remove vitamins, medicine, sugar substitutes and seasonings from the kitchen table and counters - supervise the use of tobacco and alcohol - use a GPS device in the car - use an audio or video monitor - use appliances that have an auto shut-off feature - use motion-sensor alarms outdoors, in the bedroom and the kitchen - use seat cushions, floor mats and bed pads that are wired to alarm when getting up    Why is this important?    Safety is important when you or your loved one has dementia.   Eyesight, hearing and changes in feelings of hot and cold or depth-perception may occur.   Wandering or getting lost may happen.   You/your loved one may have to stop driving.   Taking steps to improve safety can prevent injuries.   It will also help you/your loved one feel more relaxed.   It will help you/your loved one be independent for a longer time.     Assessment, press & current barriers:  Patient unable to consistently perform activities of daily living and needs additional assistance and support in order to meet this unmet need . Limited social support, ADL IADL limitations, Social Isolation, Limited access to caregiver, Cognitive Deficits, Memory Deficits, Inability to perform ADL's independently, and Inability to perform IADL's independently . Unable to self administer medications as prescribed . Lacks social connections  Clinical Goals:   Over the next 120 days, patient/caregiver will work with SW to address concerns related to care coordination needs, lack of a stable support network and lack of Brewing technologist. LCSW will assist patient in gaining additional support in order to maintain health and mental health appropriately   Over the next 120 days, patient will demonstrate improved adherence to self care as evidenced by implementing healthy self-care into his daily routine such as: attending all medical appointments, deep  breathing exercises, taking time for self-reflection, taking medications as prescribed, drinking water and daily exercise to improve mobility and mood.   Over the next 120 days, patient will demonstrate improved health management independence as evidenced by implementing healthy self-care skills and positive support/resource implementation into their daily routine to help cope with stressors and improve overall health and well-being   Over the next 120 days, patient or caregiver will verbalize basic understanding of depression/stress process and self health management plan as evidenced by his participation in development of long term plan of care and institution of self health management strategies  Interventions : . Assessed needs, level of care concerns, basic eligibility and provided education on available PCS resources within their area. . Patient has Lewy Body Dementia and is in need of a higher level of care/support. Family prefers in home support over LTC placement at this time. Reviewed community support options ( CAP, private pay, PACE program) . Will collaborate with primary care provider when needed .  Patient interviewed and appropriate assessments performed . Referred patient to community resources care guide team for assistance with in home support through [facility] . Provided mental health counseling with regard to caregiver strain . Discussed plans with patient for ongoing care management follow up and provided patient with direct contact information for care management team . Assisted patient/caregiver with obtaining information about health plan benefits . Provided education and assistance to client regarding Advanced Directives. . Provided education to patient/caregiver regarding level of care options. . Provided education to patient/caregiver about Hospice and/or Palliative Care services . 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 Marland Kitchen Patient has a neurologist  appointment today on 06/14/20. Marland Kitchen CCM LCSW educated family on LTC placement for Memory Care. . Other interventions provided: Solution-Focused Strategies   Follow Up Plan:  . SW will follow up with patient by phone over the next quarter       Follow Up Plan: SW will follow up with patient by phone over the next  quarter      Eula Fried, [place], MSW, [email or web address] Phone: [phone]

## 2020-06-14 NOTE — Telephone Encounter (Signed)
Justin Shannon 06/14/2020 Called pt regarding community resource referral received. Left message for pt to call me back, my info is [phone] please see ref notes for more details.  [place], Care Management

## 2020-06-14 NOTE — Patient Instructions (Signed)
Visit Information  Patient Care Plan: General Social Work (Adult)    Problem Identified: Caregiver Stress     Goal: Caregiver Coping Optimized   Start Date: 06/14/2020  Priority: Medium  Note:   Evidence-based guidance:   Recognize and validate the complex nature of dementia, as well as the impact on the caregiver and extended family; provide education and support to align with needs and stage of dementia.   Acknowledge feelings of grief associated with dementia such as loss of relationship, recreational activities, future with patient and loss associated with out-of-home placement.   Encourage verbalization of feelings without ruminating.   Discuss services that may help balance the caregiving role and living the life he/she chooses such as professionally or peer-led support groups, web-based support, counseling and services such as respite care or in-home help.   Refer for or provide psychoeducation activities such as cognitive reframing to improve family/caregiver quality of life, wellbeing, confidence, perception of burden, mental health and self-efficacy.   Encourage use of individualized coping strategies that may include yoga, spiritual support, distraction, exercise, relaxation, music, massage, music therapy and outdoor activities to utilize nature's restorative properties.   Suggest home-monitoring system that may reduce family/caregiver worry and improve sleep.   Provide proactive acknowledgement of potential for depressive symptoms to appear; normalize response to burden of caregiving; refer to primary care provider or for mental health services.   Notes:    Task: Recognize and Manage Caregiver Stress   Note:   Care Management Activities:    - active listening utilized - caregiver stress acknowledged - complementary therapy use encouraged - consideration of in-home help encouraged - decision-making supported - engagement with primary care provider encouraged -  healthy lifestyle promoted - mental health treatment facilitated - participation in support group encouraged - positive reinforcement provided - self-reflection promoted - social and community activities encouraged - spiritual activity use promoted - support group information provided - verbalization of feelings encouraged    Notes:    Patient Care Plan: RNCM: Lewey Body Dementia (Adult)    Problem Identified: RNCM: Management of Lewey Body Dementia   Priority: High    Long-Range Goal: RNCM: Behavior Symptoms Management   Expected End Date: 10/12/2020  Priority: High  Note:   CARE PLAN ENTRY (see longtitudinal plan of care for additional care plan information)  Current Barriers:  . Chronic Disease Management support, education, and care coordination needs related to Dementia  Clinical Goal(s) related to Dementia: Lewey Body Dementia Over the next 120 days, patient will:  . Work with the care management team to address educational, disease management, and care coordination needs  . Begin or continue self health monitoring activities as directed today  support and education for patient and caregiver with diagnosis of Lewey body dementia and decline in health . Call provider office for new or worsened signs and symptoms New or worsened symptom related to dementia and changes in conditions . Call care management team with questions or concerns . Verbalize basic understanding of patient centered plan of care established today  Interventions related to Dementia:  . Evaluation of current treatment plans and patient's adherence to plan as established by provider.  06-14-2020: The patient saw pcp on 06-11-2020. Referral for CCM team support. The patient has been living with the daughter and her family since October. His wife is actively taking radiation treatments at this time and due to decline in the patients health the daughter has moved the patient in with her and her family. The  patient saw neurologist today and further testing will be done by an in-home study and a sub referral for additional diagnosis and treatment options.  . Assessed patient understanding of disease states.  The daughter verbalized the patient does not fully understand what is going on but goes to appointments, eats well, takes medications, and is working with OT/PT in the home. An aide come 2 times a week and bathes the patient.   . Assessed patient's education and care coordination needs.  The daughter spoke to the LCSW earlier today and care guide referral made. RNCM reviewed the role of the CCM team and support of the CCM team in the help of the patient and caregiver meeting the healthy and wellness needs of the patient.  . Provided disease specific education to patient.  Education given on resources available and how to contact the RNCM. Education on follow up calls and reaching out to [facility] for new concerns before next outreach.  Nash Dimmer with appropriate clinical care team members regarding patient needs.  The patient is currently working with the LCSW. Will refer the pharmacist for help with medication cost constraints.  . - action plan for worsening symptoms mutually developed . - Financial risk analyst provided . - symptom review completed . -safety reviewed- the family uses baby monitors and knows when the patient attempts to get out of bed . -medication safety reviewed- the daughter given the patient his medications and manages his medications.   Patient Self Care Activities related to Dementia:  . Patient is unable to independently self-manage chronic health conditions  Follow up in the next 30 to 60 days    Task: RNCM: Develop Strategies to Manage Behavior   Note:   Care Management Activities:    - action plan for worsening symptoms mutually developed - community resource information provided - symptom review completed    Notes: The patient is going to have further  testing to evaluate dementia- saw neurologist today   Problem Identified: Caregiver Stress     Problem Identified: Harm or Injury     Patient Care Plan: RNCM: Management of Diabetes Type 2 (Adult)    Problem Identified: RNCM: Glycemic Management (Diabetes, Type 2)   Priority: Medium    Goal: RNCM: Management of DM   Priority: Medium  Note:   Objective:  Lab Results  Component Value Date   HGBA1C 5.9 (H) 06/05/2020 .   Lab Results  Component Value Date   CREATININE 1.94 (H) 06/06/2020   CREATININE 1.86 (H) 06/06/2020   CREATININE 1.79 (H) 06/04/2020 .   Marland Kitchen No results found for: EGFR Current Barriers:  Marland Kitchen Knowledge Deficits related to basic Diabetes pathophysiology and self care/management . Knowledge Deficits related to medications used for management of diabetes . Limited Social Support . Unable to independently manage DM as evidence of advancing dementia and inability to manage care without the help of his daughter . Unable to self administer medications as prescribed . Unable to perform ADLs independently . Unable to perform IADLs independently Case Manager Clinical Goal(s):  Marland Kitchen Over the next 120 days, patient will demonstrate improved adherence to prescribed treatment plan for diabetes self care/management as evidenced by:  . daily monitoring and recording of CBG  . adherence to ADA/ carb modified diet . adherence to prescribed medication regimen Interventions:  . Provided education to patient about basic DM disease process . Reviewed medications with patient and discussed importance of medication adherence . Discussed plans with patient for ongoing care management  follow up and provided patient with direct contact information for care management team . Provided patient with written educational materials related to hypo and hyperglycemia and importance of correct treatment . Advised patient, providing education and rationale, to check cbg daily and record, calling pcp for  findings outside established parameters.   . Review of patient status, including review of consultants reports, relevant laboratory and other test results, and medications completed. . - A1C testing facilitated . - barriers to adherence to treatment plan identified . - blood glucose readings reviewed . - resources required to improve adherence to care identified . - self-awareness of signs/symptoms of hypo or hyperglycemia encouraged Patient Goals/Self-Care Activities . Over the next 120 days, patient will:  - UNABLE to independently manage DM Self administers oral medications as prescribed Attends all scheduled provider appointments Checks blood sugars as prescribed and utilize hyper and hypoglycemia protocol as needed Adheres to prescribed ADA/carb modified Follow Up Plan: Telephone follow up appointment with care management team member scheduled for: 07-19-2020 at 0345 pm   Task: RNCM: Alleviate Barriers to Glycemic Management   Note:   Care Management Activities:    - A1C testing facilitated - barriers to adherence to treatment plan identified - blood glucose readings reviewed - resources required to improve adherence to care identified - self-awareness of signs/symptoms of hypo or hyperglycemia encouraged      Patient Care Plan: RNCM:Hypertension (Adult)    Problem Identified: Hypertension (Hypertension)     Goal: RNCM: Management of HTN   Expected End Date: 10/12/2020  Priority: Medium  Note:   Objective:  . Last practice recorded BP readings:  BP Readings from Last 3 Encounters:  06/06/20 (!) 149/57  05/21/20 (!) 124/48  05/02/20 (!) 156/82 .   Marland Kitchen Most recent eGFR/CrCl: No results found for: EGFR  No components found for: CRCL Current Barriers:  Marland Kitchen Knowledge Deficits related to basic understanding of hypertension pathophysiology and self care management . Knowledge Deficits related to understanding of medications prescribed for management of hypertension . 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 Unable to independently HTN as evidence of decline in health and having to move in with daughter to assist with care.  Leodis [place] social connections . Unable to perform ADLs independently . Unable to perform IADLs independently Case Manager Clinical Goal(s):  Marland Kitchen Over the next 120 days, patient will verbalize understanding of plan for hypertension management . Over the next 120 days, patient will demonstrate improved adherence to prescribed treatment plan for hypertension as evidenced by taking all medications as prescribed, monitoring and recording blood pressure as directed, adhering to low sodium/DASH diet . Over the next 120 days, patient will demonstrate improved health management independence as evidenced by checking blood pressure as directed and notifying PCP if SBP>160 or DBP > 90, taking all medications as prescribe, and adhering to a low sodium diet as discussed. Interventions:  Marland Kitchen UNABLE to independently:manage health and wellness due to decline in memory and recent moving in with his daughter to assist with his care . Evaluation of current treatment plan related to hypertension self management and patient's adherence to plan as established by provider. . Provided education to patient re: stroke prevention, s/s of heart attack and stroke, DASH diet, complications of uncontrolled blood pressure . Reviewed medications with patient and discussed importance of compliance. Pharmacist referral for questions and financial help for Xarelto  . Discussed plans with patient for ongoing care management follow up and provided patient with direct  contact information for care management team . Advised patient, providing education and rationale, to monitor blood pressure daily and record, calling PCP for findings outside established parameters.  . - blood pressure trends reviewed . - depression screen reviewed . - home or ambulatory blood  pressure monitoring encouraged Patient Goals/Self-Care Activities . Over the next 120 days, patient will:  - Self administers medications as prescribed Checks BP and records as discussed Follows a low sodium diet/DASH diet Follow Up Plan: Telephone follow up appointment with care management team member scheduled for: 07-19-2020 at 3:45pm   Task: RNCM: Identify and Monitor Blood Pressure Elevation   Note:   Care Management Activities:    - blood pressure trends reviewed - depression screen reviewed - home or ambulatory blood pressure monitoring encouraged        Mr. Ulysse was given information about Chronic Care Management services today including:  1. CCM service includes personalized support from designated clinical staff supervised by his physician, including individualized plan of care and coordination with other care providers 2. 24/7 contact phone numbers for assistance for urgent and routine care needs. 3. Service will only be billed when office clinical staff spend 20 minutes or more in a month to coordinate care. 4. Only one practitioner may furnish and bill the service in a calendar month. 5. The patient may stop CCM services at any time (effective at the end of the month) by phone call to the office staff. 6. The patient will be responsible for cost sharing (co-pay) of up to 20% of the service fee (after annual deductible is met).  Patient agreed to services and verbal consent obtained.   The patient verbalized understanding of instructions, educational materials, and care plan provided today and declined offer to receive copy of patient instructions, educational materials, and care plan.   Telephone follow up appointment with care management team member scheduled for: 07-19-2020 at 345 pm  Noreene Larsson RN, MSN, [facility] [place]: [phone]

## 2020-06-17 DIAGNOSIS — Z96659 Presence of unspecified artificial knee joint: Secondary | ICD-10-CM | POA: Diagnosis not present

## 2020-06-17 DIAGNOSIS — S86811A Strain of other muscle(s) and tendon(s) at lower leg level, right leg, initial encounter: Secondary | ICD-10-CM | POA: Diagnosis not present

## 2020-06-18 ENCOUNTER — Ambulatory Visit: Payer: Self-pay | Admitting: Pharmacist

## 2020-06-18 DIAGNOSIS — M4802 Spinal stenosis, cervical region: Secondary | ICD-10-CM | POA: Diagnosis not present

## 2020-06-18 DIAGNOSIS — Z96651 Presence of right artificial knee joint: Secondary | ICD-10-CM | POA: Diagnosis not present

## 2020-06-18 DIAGNOSIS — Z87891 Personal history of nicotine dependence: Secondary | ICD-10-CM | POA: Diagnosis not present

## 2020-06-18 DIAGNOSIS — I872 Venous insufficiency (chronic) (peripheral): Secondary | ICD-10-CM | POA: Diagnosis not present

## 2020-06-18 DIAGNOSIS — N184 Chronic kidney disease, stage 4 (severe): Secondary | ICD-10-CM | POA: Diagnosis not present

## 2020-06-18 DIAGNOSIS — F0280 Dementia in other diseases classified elsewhere without behavioral disturbance: Secondary | ICD-10-CM

## 2020-06-18 DIAGNOSIS — I825Y9 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: Secondary | ICD-10-CM

## 2020-06-18 DIAGNOSIS — G4733 Obstructive sleep apnea (adult) (pediatric): Secondary | ICD-10-CM | POA: Diagnosis not present

## 2020-06-18 DIAGNOSIS — M1712 Unilateral primary osteoarthritis, left knee: Secondary | ICD-10-CM | POA: Diagnosis not present

## 2020-06-18 DIAGNOSIS — Z7901 Long term (current) use of anticoagulants: Secondary | ICD-10-CM | POA: Diagnosis not present

## 2020-06-18 DIAGNOSIS — I129 Hypertensive chronic kidney disease with stage 1 through stage 4 chronic kidney disease, or unspecified chronic kidney disease: Secondary | ICD-10-CM | POA: Diagnosis not present

## 2020-06-18 DIAGNOSIS — E1151 Type 2 diabetes mellitus with diabetic peripheral angiopathy without gangrene: Secondary | ICD-10-CM | POA: Diagnosis not present

## 2020-06-18 DIAGNOSIS — E1142 Type 2 diabetes mellitus with diabetic polyneuropathy: Secondary | ICD-10-CM | POA: Diagnosis not present

## 2020-06-18 DIAGNOSIS — Z86718 Personal history of other venous thrombosis and embolism: Secondary | ICD-10-CM | POA: Diagnosis not present

## 2020-06-18 DIAGNOSIS — J449 Chronic obstructive pulmonary disease, unspecified: Secondary | ICD-10-CM | POA: Diagnosis not present

## 2020-06-18 DIAGNOSIS — D631 Anemia in chronic kidney disease: Secondary | ICD-10-CM | POA: Diagnosis not present

## 2020-06-18 DIAGNOSIS — M109 Gout, unspecified: Secondary | ICD-10-CM | POA: Diagnosis not present

## 2020-06-18 DIAGNOSIS — J309 Allergic rhinitis, unspecified: Secondary | ICD-10-CM | POA: Diagnosis not present

## 2020-06-18 DIAGNOSIS — E1122 Type 2 diabetes mellitus with diabetic chronic kidney disease: Secondary | ICD-10-CM | POA: Diagnosis not present

## 2020-06-18 DIAGNOSIS — I890 Lymphedema, not elsewhere classified: Secondary | ICD-10-CM | POA: Diagnosis not present

## 2020-06-18 NOTE — Chronic Care Management (AMB) (Signed)
Chronic Care Management   Pharmacy Note  06/18/2020 Name: Justin Shannon. MRN: [id number] DOB: 02/12/1941  Subjective:  Justin Jutte. is a 79 y.o. year old male who is a primary care patient of [name], DO. The CCM team was consulted for assistance with chronic disease management and care coordination needs.    Engaged with patient by telephone for initial visit in response to provider referral for pharmacy case management and/or care coordination services.   Consent to Services:  Justin Kokesh. was given information about Chronic Care Management services, agreed to services, and gave verbal consent prior to initiation of services on 06/12/2020. Please see initial visit note for detailed documentation.   Objective:  Lab Results  Component Value Date   CREATININE 1.94 (H) 06/06/2020   CREATININE 1.86 (H) 06/06/2020   CREATININE 1.79 (H) 06/04/2020    Lab Results  Component Value Date   HGBA1C 5.9 (H) 06/05/2020       Component Value Date/Time   CHOL 96 06/05/2020 0816   TRIG 81 06/05/2020 0816   HDL 57 06/05/2020 0816   CHOLHDL 1.7 06/05/2020 0816   VLDL 16 06/05/2020 0816   LDLCALC 23 06/05/2020 0816    BP Readings from Last 3 Encounters:  06/06/20 (!) 149/57  05/21/20 (!) 124/48  05/02/20 (!) 156/82    Assessment/Interventions: Review of patient past medical history, allergies, medications, health status, including review of consultants reports, laboratory and other test data, was performed as part of comprehensive evaluation and provision of chronic care management services.   SDOH (Social Determinants of Health) assessments and interventions performed:    CCM Care Plan  Allergies  Allergen Reactions  . Shellfish Allergy Anaphylaxis and Other (See Comments)    gout  . Iodinated Diagnostic Agents Other (See Comments)    Chronic kidney disease 3b  . Nsaids Other (See Comments)    Chronic kidney disease 3b  . Rivastigmine Other  (See Comments)    Side effect and not a true allergy    Medications Reviewed Today    Reviewed by [name], DO (Physician) on 06/11/20 at 1134  Med List Status: <None>  Medication Order Taking? Sig Documenting Provider Last Dose Status Informant  albuterol (VENTOLIN HFA) 108 (90 Base) MCG/ACT inhaler [id number] Yes Inhale 2 puffs into the lungs every 4 (four) hours as needed for wheezing. [provider] Taking Active Family Member  allopurinol (ZYLOPRIM) 100 MG tablet [id number] Yes Take 100 mg by mouth daily. [provider] Taking Active Family Member  atorvastatin (LIPITOR) 20 MG tablet [id number] Yes Take 1 tablet (20 mg total) by mouth at bedtime. [name], DO Taking Active Family Member  docusate sodium (COLACE) 100 MG capsule [id number] Yes Take 100 mg by mouth daily as needed for mild constipation or moderate constipation.  [provider] Taking Active Family Member  fexofenadine (ALLEGRA) 180 MG tablet [id number] Yes Take 180 mg by mouth daily as needed for allergies.  [provider] Taking Active Family Member  fluticasone (FLONASE) 50 MCG/ACT nasal spray [id number] Yes Place 2 sprays into both nostrils daily as needed for allergies.  [provider] Taking Active Family Member           Med Note Vilma Prader, Darin Engels   Fri Jun 24, 2019 11:07 PM)    Fluticasone-Salmeterol (ADVAIR) 250-50 MCG/DOSE AEPB [id number] Yes Inhale 1 puff into the lungs 2 (two) times daily. [provider] Taking Active Family Member  gabapentin (NEURONTIN) 100  MG capsule [id number] Yes Take 1 capsule (100 mg total) by mouth 2 (two) times daily. Antonieta Pert, MD Taking Active   lamoTRIgine (LAMICTAL) 25 MG tablet [id number] Yes Take 6 tablets (150 mg total) by mouth 2 (two) times daily Follow titration instructions given in office by your neurology. Antonieta Pert, MD Taking Active   levETIRAcetam (KEPPRA) 500 MG tablet [id number] Yes Take 500 mg by  mouth 2 (two) times daily.  [provider] Taking Active Family Member  Magnesium 250 MG TABS [id number] Yes Take 250 mg by mouth daily. [provider] Taking Active Family Member  melatonin 5 MG TABS [id number] Yes Take 5 mg by mouth at bedtime as needed (sleep). [provider] Taking Active Family Member  metoprolol succinate (TOPROL-XL) 50 MG 24 hr tablet [id number] Yes Take 50 mg by mouth daily.  [provider] Taking Active Family Member  Multiple Vitamin (ONE-A-DAY MENS PO) [id number] Yes Take 1 tablet by mouth daily.  [provider] Taking Active Family Member  QUEtiapine (SEROQUEL) 25 MG tablet [id number] Yes Take 0.5 tablets (12.5 mg total) by mouth 2 (two) times daily. Antonieta Pert, MD Taking Active   XARELTO 10 MG TABS tablet [id number] Yes Take 10 mg by mouth daily before breakfast. [provider] Taking Active Family Member          Patient Active Problem List   Diagnosis Date Noted  . Slurred speech 06/04/2020  . CKD (chronic kidney disease) stage 3, GFR 30-59 ml/min (HCC) 06/04/2020  . Essential hypertension 06/04/2020  . Hyperlipidemia 06/04/2020  . Lewy body dementia ([place]) 05/21/2020  . Hyperlipidemia associated with type 2 diabetes mellitus ([place]) 05/21/2020  . Status post total right knee replacement 01/22/2020  . Anemia of chronic kidney failure, stage 3 (moderate) ([place]) 01/16/2020  . Total knee replacement status 11/02/2019  . History of total knee arthroplasty 11/02/2019  . PAD (peripheral artery disease) ([place]) 10/13/2019  . Benign prostatic hyperplasia   . Seizures ([place])   . Anemia due to stage 4 chronic kidney disease ([place]) 06/24/2019  . Generalized weakness 06/24/2019  . Anemia in chronic kidney disease ([place]) 06/24/2019  . Asthenia 06/24/2019  . Primary osteoarthritis of right knee 04/03/2019  . Ankle ulcer ([place]) 05/08/2018  . Use of cane as ambulatory aid 04/27/2018  . Dependence on other enabling machines  and devices 04/27/2018  . Type 2 diabetes mellitus with stage 4 chronic kidney disease, without long-term current use of insulin ([place]) 06/11/2017  . Lymphedema 05/04/2016  . Chronic venous insufficiency 05/04/2016  . Toe sprain 01/24/2016  . Morbid obesity with body mass index of 40.0-44.9 in adult [facility]) 05/24/2014  . Body mass index (BMI)40.0-44.9, adult 05/24/2014  . OSA on CPAP 01/10/2014  . CKD (chronic kidney disease), stage IV ([place]) 11/22/2013  . Allergic rhinitis 11/06/2013  . Benign hypertension with CKD (chronic kidney disease) stage IV ([place]) 11/06/2013  . Epilepsy ([place]) 11/06/2013  . Gout 11/06/2013  . History of DVT (deep vein thrombosis) 11/06/2013  . Chronic deep vein thrombosis (DVT) of proximal vein of lower extremity ([place]) 11/06/2013  . 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 Personal history of other venous thrombosis and embolism 11/06/2013    Patient Care Plan: General Pharmacy (Adult)    Problem Identified: Disease Progression     Long-Range Goal: Disease Progression Prevented or Minimized   Start Date: 06/18/2020  Expected End Date: 09/16/2020  This Visit's Progress: On track  Priority: High  Note:   Current Barriers:  . Unable to independently afford treatment regimen . Memory loss (dementia)  Pharmacist Clinical Goal(s):  Marland Kitchen Over the next 90 days, patient will verbalize ability to afford treatment regimen through collaboration with Justin Shannon and provider.   Interventions: . 1:1 collaboration with [name], DO regarding development and update of comprehensive plan of care as evidenced by provider attestation and co-signature . Inter-disciplinary care team collaboration (see longitudinal plan of care) o Receive referral from [place] for medication assistance. States daughter/caregiver requesting financial assistance for Xarelto. . Schedule appointment  with caregiver/patient to complete medication review   Medication Assistance . From review of chart, note that on 05/11/20, [facility] provided patient supply of Xarelto to last through end of calendar year o Daughter confirms patient has supply of Xarelto to last through end of calendar year . Counsel on Medicare Part D plan per information from [employer] . Reports have applied for patient assistance for Xarelto from [place] in the past, but has been denied o Review eligibility criteria for this program, including out of pocket expenditure requirement for each calendar year . Based on reported household income, encourage caregiver to assist patient with application for Extra Help through [place] o Daughter has access to computer. Verbally walk daughter through how to access application online o Counsel daughter that once application is submitted, should look for approval/denial letter in mail in ~4 weeks and importance of retaining letter.   Patient Goals/Self-Care Activities . Over the next 90 days, patient will:  - collaborate with provider on medication access solutions  Follow Up Plan: Telephone follow up appointment with care management team member scheduled for: 06/29/2020 at 9:30 am    Justin Shannon, Justin Shannon, [place] [phone]

## 2020-06-18 NOTE — Patient Instructions (Signed)
Thank you allowing the Chronic Care Management Team to be a part of your care! It was a pleasure speaking with you today!     CCM (Chronic Care Management) Team    Noreene Larsson RN, MSN, CCM Nurse Care Coordinator  [phone]   Harlow Asa PharmD  Clinical Pharmacist  [phone]   Eula Fried LCSW Clinical Social Worker [phone]  Visit Information  Patient Care Plan: General Pharmacy (Adult)    Problem Identified: Disease Progression     Long-Range Goal: Disease Progression Prevented or Minimized   Start Date: 06/18/2020  Expected End Date: 09/16/2020  This Visit's Progress: On track  Priority: High  Note:   Current Barriers:  . Unable to independently afford treatment regimen . Memory loss (dementia)  Pharmacist Clinical Goal(s):  Marland Kitchen Over the next 90 days, patient will verbalize ability to afford treatment regimen through collaboration with PharmD and provider.   Interventions: . 1:1 collaboration with [name], DO regarding development and update of comprehensive plan of care as evidenced by provider attestation and co-signature . Inter-disciplinary care team collaboration (see longitudinal plan of care) o Receive referral from [place] for medication assistance. States daughter/caregiver requesting financial assistance for Xarelto. . Schedule appointment with caregiver/patient to complete medication review   Medication Assistance . From review of chart, note that on 05/11/20, [facility] provided patient supply of Xarelto to last through end of calendar year o Daughter confirms patient has supply of Xarelto to last through end of calendar year . Counsel on Medicare Part D plan per information from [employer] . Reports have applied for patient assistance for Xarelto from [place] in the past, but has been denied o Review eligibility criteria for this program, including out of pocket  expenditure requirement for each calendar year . Based on reported household income, encourage caregiver to assist patient with application for Extra Help through [place] o Daughter has access to computer. Verbally walk daughter through how to access application online o Counsel daughter that once application is submitted, should look for approval/denial letter in mail in ~4 weeks and importance of retaining letter.   Patient Goals/Self-Care Activities . Over the next 90 days, patient will:  - collaborate with provider on medication access solutions  Follow Up Plan: Telephone follow up appointment with care management team member scheduled for: 06/29/2020 at 9:30 am     The patient verbalized understanding of instructions, educational materials, and care plan provided today and declined offer to receive copy of patient instructions, educational materials, and care plan.    Harlow Asa, PharmD, [employer] [phone]

## 2020-06-19 ENCOUNTER — Telehealth: Payer: Self-pay | Admitting: Family Medicine

## 2020-06-19 DIAGNOSIS — Z86718 Personal history of other venous thrombosis and embolism: Secondary | ICD-10-CM | POA: Diagnosis not present

## 2020-06-19 DIAGNOSIS — Z7901 Long term (current) use of anticoagulants: Secondary | ICD-10-CM | POA: Diagnosis not present

## 2020-06-19 DIAGNOSIS — I872 Venous insufficiency (chronic) (peripheral): Secondary | ICD-10-CM | POA: Diagnosis not present

## 2020-06-19 DIAGNOSIS — D631 Anemia in chronic kidney disease: Secondary | ICD-10-CM | POA: Diagnosis not present

## 2020-06-19 DIAGNOSIS — Z87891 Personal history of nicotine dependence: Secondary | ICD-10-CM | POA: Diagnosis not present

## 2020-06-19 DIAGNOSIS — M1712 Unilateral primary osteoarthritis, left knee: Secondary | ICD-10-CM | POA: Diagnosis not present

## 2020-06-19 DIAGNOSIS — Z96651 Presence of right artificial knee joint: Secondary | ICD-10-CM | POA: Diagnosis not present

## 2020-06-19 DIAGNOSIS — I890 Lymphedema, not elsewhere classified: Secondary | ICD-10-CM | POA: Diagnosis not present

## 2020-06-19 DIAGNOSIS — E1151 Type 2 diabetes mellitus with diabetic peripheral angiopathy without gangrene: Secondary | ICD-10-CM | POA: Diagnosis not present

## 2020-06-19 DIAGNOSIS — M4802 Spinal stenosis, cervical region: Secondary | ICD-10-CM | POA: Diagnosis not present

## 2020-06-19 DIAGNOSIS — G4733 Obstructive sleep apnea (adult) (pediatric): Secondary | ICD-10-CM | POA: Diagnosis not present

## 2020-06-19 DIAGNOSIS — M109 Gout, unspecified: Secondary | ICD-10-CM | POA: Diagnosis not present

## 2020-06-19 DIAGNOSIS — E1122 Type 2 diabetes mellitus with diabetic chronic kidney disease: Secondary | ICD-10-CM | POA: Diagnosis not present

## 2020-06-19 DIAGNOSIS — E1142 Type 2 diabetes mellitus with diabetic polyneuropathy: Secondary | ICD-10-CM | POA: Diagnosis not present

## 2020-06-19 DIAGNOSIS — J309 Allergic rhinitis, unspecified: Secondary | ICD-10-CM | POA: Diagnosis not present

## 2020-06-19 DIAGNOSIS — N184 Chronic kidney disease, stage 4 (severe): Secondary | ICD-10-CM | POA: Diagnosis not present

## 2020-06-19 DIAGNOSIS — I129 Hypertensive chronic kidney disease with stage 1 through stage 4 chronic kidney disease, or unspecified chronic kidney disease: Secondary | ICD-10-CM | POA: Diagnosis not present

## 2020-06-19 DIAGNOSIS — J449 Chronic obstructive pulmonary disease, unspecified: Secondary | ICD-10-CM | POA: Diagnosis not present

## 2020-06-19 NOTE — Telephone Encounter (Signed)
Left detail message. 

## 2020-06-19 NOTE — Telephone Encounter (Signed)
Colletta Maryland with [facility] would like an order to do lab draw.  Pt missed his lab appt. They do not have availability until next week if that is appropriate.   cb [phone]

## 2020-06-20 DIAGNOSIS — E1151 Type 2 diabetes mellitus with diabetic peripheral angiopathy without gangrene: Secondary | ICD-10-CM | POA: Diagnosis not present

## 2020-06-20 DIAGNOSIS — Z7901 Long term (current) use of anticoagulants: Secondary | ICD-10-CM | POA: Diagnosis not present

## 2020-06-20 DIAGNOSIS — M109 Gout, unspecified: Secondary | ICD-10-CM | POA: Diagnosis not present

## 2020-06-20 DIAGNOSIS — E1122 Type 2 diabetes mellitus with diabetic chronic kidney disease: Secondary | ICD-10-CM | POA: Diagnosis not present

## 2020-06-20 DIAGNOSIS — Z87891 Personal history of nicotine dependence: Secondary | ICD-10-CM | POA: Diagnosis not present

## 2020-06-20 DIAGNOSIS — Z96651 Presence of right artificial knee joint: Secondary | ICD-10-CM | POA: Diagnosis not present

## 2020-06-20 DIAGNOSIS — I890 Lymphedema, not elsewhere classified: Secondary | ICD-10-CM | POA: Diagnosis not present

## 2020-06-20 DIAGNOSIS — I872 Venous insufficiency (chronic) (peripheral): Secondary | ICD-10-CM | POA: Diagnosis not present

## 2020-06-20 DIAGNOSIS — J309 Allergic rhinitis, unspecified: Secondary | ICD-10-CM | POA: Diagnosis not present

## 2020-06-20 DIAGNOSIS — G4733 Obstructive sleep apnea (adult) (pediatric): Secondary | ICD-10-CM | POA: Diagnosis not present

## 2020-06-20 DIAGNOSIS — E1142 Type 2 diabetes mellitus with diabetic polyneuropathy: Secondary | ICD-10-CM | POA: Diagnosis not present

## 2020-06-20 DIAGNOSIS — M4802 Spinal stenosis, cervical region: Secondary | ICD-10-CM | POA: Diagnosis not present

## 2020-06-20 DIAGNOSIS — M1712 Unilateral primary osteoarthritis, left knee: Secondary | ICD-10-CM | POA: Diagnosis not present

## 2020-06-20 DIAGNOSIS — D631 Anemia in chronic kidney disease: Secondary | ICD-10-CM | POA: Diagnosis not present

## 2020-06-20 DIAGNOSIS — Z86718 Personal history of other venous thrombosis and embolism: Secondary | ICD-10-CM | POA: Diagnosis not present

## 2020-06-20 DIAGNOSIS — I129 Hypertensive chronic kidney disease with stage 1 through stage 4 chronic kidney disease, or unspecified chronic kidney disease: Secondary | ICD-10-CM | POA: Diagnosis not present

## 2020-06-20 DIAGNOSIS — J449 Chronic obstructive pulmonary disease, unspecified: Secondary | ICD-10-CM | POA: Diagnosis not present

## 2020-06-20 DIAGNOSIS — N184 Chronic kidney disease, stage 4 (severe): Secondary | ICD-10-CM | POA: Diagnosis not present

## 2020-06-21 ENCOUNTER — Encounter: Payer: Self-pay | Admitting: Primary Care

## 2020-06-21 ENCOUNTER — Telehealth: Payer: Self-pay | Admitting: Family Medicine

## 2020-06-21 DIAGNOSIS — Z87891 Personal history of nicotine dependence: Secondary | ICD-10-CM | POA: Diagnosis not present

## 2020-06-21 DIAGNOSIS — N184 Chronic kidney disease, stage 4 (severe): Secondary | ICD-10-CM | POA: Diagnosis not present

## 2020-06-21 DIAGNOSIS — I129 Hypertensive chronic kidney disease with stage 1 through stage 4 chronic kidney disease, or unspecified chronic kidney disease: Secondary | ICD-10-CM | POA: Diagnosis not present

## 2020-06-21 DIAGNOSIS — J449 Chronic obstructive pulmonary disease, unspecified: Secondary | ICD-10-CM | POA: Diagnosis not present

## 2020-06-21 DIAGNOSIS — I872 Venous insufficiency (chronic) (peripheral): Secondary | ICD-10-CM | POA: Diagnosis not present

## 2020-06-21 DIAGNOSIS — D631 Anemia in chronic kidney disease: Secondary | ICD-10-CM | POA: Diagnosis not present

## 2020-06-21 DIAGNOSIS — Z86718 Personal history of other venous thrombosis and embolism: Secondary | ICD-10-CM | POA: Diagnosis not present

## 2020-06-21 DIAGNOSIS — J309 Allergic rhinitis, unspecified: Secondary | ICD-10-CM | POA: Diagnosis not present

## 2020-06-21 DIAGNOSIS — G4733 Obstructive sleep apnea (adult) (pediatric): Secondary | ICD-10-CM | POA: Diagnosis not present

## 2020-06-21 DIAGNOSIS — Z7901 Long term (current) use of anticoagulants: Secondary | ICD-10-CM | POA: Diagnosis not present

## 2020-06-21 DIAGNOSIS — M1712 Unilateral primary osteoarthritis, left knee: Secondary | ICD-10-CM | POA: Diagnosis not present

## 2020-06-21 DIAGNOSIS — E1151 Type 2 diabetes mellitus with diabetic peripheral angiopathy without gangrene: Secondary | ICD-10-CM | POA: Diagnosis not present

## 2020-06-21 DIAGNOSIS — M109 Gout, unspecified: Secondary | ICD-10-CM | POA: Diagnosis not present

## 2020-06-21 DIAGNOSIS — Z96651 Presence of right artificial knee joint: Secondary | ICD-10-CM | POA: Diagnosis not present

## 2020-06-21 DIAGNOSIS — I890 Lymphedema, not elsewhere classified: Secondary | ICD-10-CM | POA: Diagnosis not present

## 2020-06-21 DIAGNOSIS — E1122 Type 2 diabetes mellitus with diabetic chronic kidney disease: Secondary | ICD-10-CM | POA: Diagnosis not present

## 2020-06-21 DIAGNOSIS — M4802 Spinal stenosis, cervical region: Secondary | ICD-10-CM | POA: Diagnosis not present

## 2020-06-21 DIAGNOSIS — E1142 Type 2 diabetes mellitus with diabetic polyneuropathy: Secondary | ICD-10-CM | POA: Diagnosis not present

## 2020-06-21 NOTE — Telephone Encounter (Signed)
Justin Shannon 06/21/2020 Called pt regarding community resource referral received. Left message for pt to call me back, my info is [phone] please see ref notes for more details.  [place], Care Management

## 2020-06-21 NOTE — Telephone Encounter (Signed)
Left message for RN taking care of this patient and spoke to the receptionist about verbal approval, RN can call us back with further more question.

## 2020-06-22 ENCOUNTER — Other Ambulatory Visit: Payer: Self-pay

## 2020-06-22 ENCOUNTER — Other Ambulatory Visit: Payer: Medicare Other | Admitting: Primary Care

## 2020-06-22 DIAGNOSIS — G3183 Dementia with Lewy bodies: Secondary | ICD-10-CM

## 2020-06-22 DIAGNOSIS — F02818 Dementia in other diseases classified elsewhere, unspecified severity, with other behavioral disturbance: Secondary | ICD-10-CM

## 2020-06-22 DIAGNOSIS — Z515 Encounter for palliative care: Secondary | ICD-10-CM

## 2020-06-22 DIAGNOSIS — F0281 Dementia in other diseases classified elsewhere with behavioral disturbance: Secondary | ICD-10-CM

## 2020-06-22 DIAGNOSIS — Z96651 Presence of right artificial knee joint: Secondary | ICD-10-CM | POA: Diagnosis not present

## 2020-06-22 DIAGNOSIS — Z6841 Body Mass Index (BMI) 40.0 and over, adult: Secondary | ICD-10-CM

## 2020-06-22 NOTE — Progress Notes (Signed)
[facility] Palliative Care Consult Note Telephone: [phone]  Fax: [phone]     Date of encounter: 06/22/20 PATIENT NAME: Justin Shannon [id number] [phone] (home)  DOB: 1941/04/04 MRN: [id number]  PRIMARY CARE PROVIDER:    [name], DO,  [address] [place] [address] [phone]  REFERRING PROVIDER:   [name], DO [address] [place],  [place] [address] [phone]  RESPONSIBLE PARTY:   Extended Emergency Contact Information Primary Emergency Contact: Herm[place], [place], [place] [address] [place]te [place] of [place] Phone: [phone] Work Phone: [phone] Mobile Phone: [phone] Relation: Daughter Secondary Emergency Contact: [name] Address: [address] [place]          [place], [place] [address] [place] Phone: [phone] Mobile Phone: [phone] Relation: Spouse  I met face to face with patient and family in  Home.. Palliative Care was asked to follow this patient by consultation request of Nobie Putnam * to help address advance care planning and goals of care. This is a follow up  visit.   ASSESSMENT AND RECOMMENDATIONS:   1. Advance Care Planning/Goals of Care: Goals include to maximize quality of life and symptom management.I met with Justin Shannon in his home. His daughter had called with concerns about leg woundss and redness. She states he still has home PT and OT but nursing has stopped. I discussed with his former home health nurse Dorthy Cooler RN regarding patient's skin integrity. He states that patient chronically does not elevate legs consistently or use compression which leads to edema and blisters. He also has a leg brace which causes shear wounds. We discussed patient's decline as RN is familiar with patient over 6 to 8 months. He has had several orthopedic surgeries and has not returned to his baseline  after each procedure and rehab. RN also states that they had been discussing skilled facility placement due to extreme caregiver strain.  2. Symptom Management:  Skin integrity: Patient has edema, slight on left and has a compression sock with good at skin integrity. On the right he has 2+ edema with several areas of excoriation from the edema. The largest area is 1 cm round on his pre-tibia it is superficial but open and weeping. There are multiple areas of breakdown around his ankles, eg joint areas. I have ordered wound care for the family to perform. They are  to cleanse area, apply a moisturizer, apply non-bordered foam dressing to open areas, wrap with Kerlix to knee Gatch. Apply light  compression to knees with coban. Daughter is knowledgeable and affirms her willingness and ability to perform wound care. I asked her to follow up with Home Health RN next week  for him to assess and order supplies. [facility]. Limb is warm with good pulses. No signs of infection  3. Follow up Palliative Care Visit: Palliative care will continue to follow for goals of care clarification and symptom management. Return 2-4 weeks or prn.  4. Family /Caregiver/Community Supports:Lives with wife and daughter, has Home health via [facility].   5. Cognitive / Functional decline: A and O x 2, hallucination history, dependent in adls, all iadls.  I spent 40 minutes providing this consultation,  from 1600 to 1640. More than 50% of the time in this consultation was spent coordinating communication.   CODE STATUS:FULL  PPS: 40%  HOSPICE ELIGIBILITY/DIAGNOSIS: TBD  Subjective:  CHIEF COMPLAINT: le wounds  HISTORY OF PRESENT ILLNESS:  Justin Kops. is a 79 y.o. year old male  with debility, skin break down from edema, PD, lewy body dementia. Today concerned with  Skin wounds due to edema. No pain but open and weeping serosanguinous drainage. Responses well to compression  We are  asked to consult around advance care planning and complex medical decision making.  History obtained from review of EMR, discussion with primary team, and  interview with family, caregiver  and/or Justin Shannon. Records reviewed and summarized above.   CURRENT PROBLEM LIST:  Patient Active Problem List   Diagnosis Date Noted  . Slurred speech 06/04/2020  . CKD (chronic kidney disease) stage 3, GFR 30-59 ml/min (HCC) 06/04/2020  . Essential hypertension 06/04/2020  . Hyperlipidemia 06/04/2020  . Lewy body dementia ([place]) 05/21/2020  . Hyperlipidemia associated with type 2 diabetes mellitus ([place]) 05/21/2020  . Status post total right knee replacement 01/22/2020  . Anemia of chronic kidney failure, stage 3 (moderate) ([place]) 01/16/2020  . Total knee replacement status 11/02/2019  . History of total knee arthroplasty 11/02/2019  . PAD (peripheral artery disease) ([place]) 10/13/2019  . Benign prostatic hyperplasia   . Seizures ([place])   . Anemia due to stage 4 chronic kidney disease ([place]) 06/24/2019  . Generalized weakness 06/24/2019  . Anemia in chronic kidney disease (CODE) 06/24/2019  . Asthenia 06/24/2019  . Primary osteoarthritis of right knee 04/03/2019  . Ankle ulcer ([place]) 05/08/2018  . Use of cane as ambulatory aid 04/27/2018  . Dependence on other enabling machines and devices 04/27/2018  . Type 2 diabetes mellitus with stage 4 chronic kidney disease, without long-term current use of insulin ([place]) 06/11/2017  . Lymphedema 05/04/2016  . Chronic venous insufficiency 05/04/2016  . Toe sprain 01/24/2016  . Morbid obesity with body mass index of 40.0-44.9 in adult [facility]) 05/24/2014  . Body mass index (BMI)40.0-44.9, adult 05/24/2014  . OSA on CPAP 01/10/2014  . CKD (chronic kidney disease), stage IV ([place]) 11/22/2013  . Allergic rhinitis 11/06/2013  . Benign hypertension with CKD (chronic kidney disease) stage IV ([place]) 11/06/2013  . Epilepsy ([place]) 11/06/2013  . Gout 11/06/2013  . History  of DVT (deep vein thrombosis) 11/06/2013  . Chronic deep vein thrombosis (DVT) of proximal vein of lower extremity ([place]) 11/06/2013  . 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 Personal history of other venous thrombosis and embolism 11/06/2013   PAST MEDICAL HISTORY:  Active Ambulatory Problems    Diagnosis Date Noted  . Allergic rhinitis 11/06/2013  . Benign hypertension with CKD (chronic kidney disease) stage IV ([place]) 11/06/2013  . CKD (chronic kidney disease), stage IV ([place]) 11/22/2013  . Epilepsy ([place]) 11/06/2013  . Gout 11/06/2013  . History of DVT (deep vein thrombosis) 11/06/2013  . OSA on CPAP 01/10/2014  . Toe sprain 01/24/2016  . Lymphedema 05/04/2016  . Chronic venous insufficiency 05/04/2016  . Morbid obesity with body mass index of 40.0-44.9 in adult [facility]) 05/24/2014  . Chronic deep vein thrombosis (DVT) of proximal vein of lower extremity ([place]) 11/06/2013  . Type 2 diabetes mellitus with stage 4 chronic kidney disease, without long-term current use of insulin ([place]) 06/11/2017  . Use of cane as ambulatory aid 04/27/2018  . Primary osteoarthritis of right knee 04/03/2019  . Anemia due to stage 4 chronic kidney disease ([place]) 06/24/2019  . Generalized weakness 06/24/2019  . Benign prostatic hyperplasia   .  Seizures (HCC)   . PAD (peripheral artery disease) (HCC) 10/13/2019  . Total knee replacement status 11/02/2019  . Anemia in chronic kidney disease (CODE) 06/24/2019  . 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 (HCC) 11/06/2013  . Personal history of other venous thrombosis and embolism 11/06/2013  . Anemia of chronic kidney failure, stage 3 (moderate) (HCC) 01/16/2020  . Asthenia 06/24/2019  . Body mass index (BMI)40.0-44.9, adult 05/24/2014  . History of total knee arthroplasty 11/02/2019  . Status post total right knee replacement  01/22/2020  . Dependence on other enabling machines and devices 04/27/2018  . Ankle ulcer (HCC) 05/08/2018  . Lewy body dementia (HCC) 05/21/2020  . Hyperlipidemia associated with type 2 diabetes mellitus (HCC) 05/21/2020  . Slurred speech 06/04/2020  . CKD (chronic kidney disease) stage 3, GFR 30-59 ml/min (HCC) 06/04/2020  . Essential hypertension 06/04/2020  . Hyperlipidemia 06/04/2020   Resolved Ambulatory Problems    Diagnosis Date Noted  . Health care maintenance 12/11/2014  . Morbid (severe) obesity due to excess calories (HCC) 05/24/2014  . Nocturnal seizures (HCC) 11/06/2013  . Venous ulcer (HCC) 04/19/2017  . Venous ulcer of ankle, right (HCC) 05/08/2018  . Pneumonia due to COVID-19 virus 06/24/2019  . Respiratory failure with hypoxia (HCC) 06/24/2019  . Anemia of chronic renal failure, stage 3a (HCC)   . Hyperkalemia   . Acute kidney injury superimposed on CKD (HCC)   . Patellar tendon rupture, right, initial encounter 12/05/2019  . Acute renal failure syndrome (HCC) 01/16/2020  . Rupture of patellar tendon 12/05/2019  . Pneumonia due to coronavirus disease 2019 06/24/2019  . Injury of toe 01/24/2016   Past Medical History:  Diagnosis Date  . Arthritis   . Bilateral swelling of feet   . BPH (benign prostatic hyperplasia)   . Chronic kidney disease   . DVT (deep venous thrombosis) (HCC)   . History of gout   . Sleep apnea   . Tendonitis of foot    SOCIAL HX:  Social History   Tobacco Use  . Smoking status: Former Smoker    Packs/day: 1.00    Years: 2.00    Pack years: 2.00    Types: Cigarettes    Quit date: 06/23/1968    Years since quitting: 52.0  . Smokeless tobacco: Never Used  . Tobacco comment: 25 years ago quit  Substance Use Topics  . Alcohol use: No   FAMILY HX:  Family History  Problem Relation Age of Onset  . Arthritis Mother   . Hypertension Mother   . Diabetes Brother      ALLERGIES:  Allergies  Allergen Reactions  . Shellfish  Allergy Anaphylaxis and Other (See Comments)    gout  . Iodinated Diagnostic Agents Other (See Comments)    Chronic kidney disease 3b  . Nsaids Other (See Comments)    Chronic kidney disease 3b  . Rivastigmine Other (See Comments)    Side effect and not a true allergy     PERTINENT MEDICATIONS:  Outpatient Encounter Medications as of 06/22/2020  Medication Sig  . albuterol (VENTOLIN HFA) 108 (90 Base) MCG/ACT inhaler Inhale 2 puffs into the lungs every 4 (four) hours as needed for wheezing.  Marland Kitchen allopurinol (ZYLOPRIM) 100 MG tablet Take 100 mg by mouth daily.  Marland Kitchen atorvastatin (LIPITOR) 20 MG tablet Take 1 tablet (20 mg total) by mouth at bedtime.  . docusate sodium (COLACE) 100 MG capsule Take 100 mg by mouth daily as needed  for mild constipation or moderate constipation.   . fexofenadine (ALLEGRA) 180 MG tablet Take 180 mg by mouth daily as needed for allergies.   . fluticasone (FLONASE) 50 MCG/ACT nasal spray Place 2 sprays into both nostrils daily as needed for allergies.   . Fluticasone-Salmeterol (ADVAIR) 250-50 MCG/DOSE AEPB Inhale 1 puff into the lungs 2 (two) times daily.  Marland Kitchen gabapentin (NEURONTIN) 100 MG capsule Take 1 capsule (100 mg total) by mouth 2 (two) times daily.  Marland Kitchen lamoTRIgine (LAMICTAL) 25 MG tablet Take 6 tablets (150 mg total) by mouth 2 (two) times daily Follow titration instructions given in office by your neurology.  . levETIRAcetam (KEPPRA) 500 MG tablet Take 500 mg by mouth 2 (two) times daily.   . Magnesium 250 MG TABS Take 250 mg by mouth daily.  . melatonin 5 MG TABS Take 5 mg by mouth at bedtime as needed (sleep).  . metoprolol succinate (TOPROL-XL) 50 MG 24 hr tablet Take 50 mg by mouth daily.   . Multiple Vitamin (ONE-A-DAY MENS PO) Take 1 tablet by mouth daily.   . QUEtiapine (SEROQUEL) 25 MG tablet Take 0.5 tablets (12.5 mg total) by mouth 2 (two) times daily.  Alveda Reasons 10 MG TABS tablet Take 10 mg by mouth daily before breakfast.   No facility-administered  encounter medications on file as of 06/22/2020.    Objective: See notes above   Thank you for the opportunity to participate in the care of Justin Shannon.  The palliative care team will continue to follow. Please call our office at [phone] if we can be of additional assistance.  Jason Coop, NP , DNP, MPH, AGPCNP-BC, ACHPN  COVID-19 PATIENT SCREENING TOOL  Person answering questions: ____________daughter______ _____   1.  Is the patient or any family member in the home showing any signs or symptoms regarding respiratory infection?               Person with Symptom- __________NA_________________  a. Fever                                                                          Yes___ No___          ___________________  b. Shortness of breath                                                    Yes___ No___          ___________________ c. Cough/congestion                                       Yes___  No___         ___________________ d. Body aches/pains  Yes___ No___        ____________________ e. Gastrointestinal symptoms (diarrhea, nausea)           Yes___ No___        ____________________  2. Within the past 14 days, has anyone living in the home had any contact with someone with or under investigation for COVID-19?    Yes___ No_X_   Person __________________

## 2020-06-23 DIAGNOSIS — M1712 Unilateral primary osteoarthritis, left knee: Secondary | ICD-10-CM | POA: Diagnosis not present

## 2020-06-26 DIAGNOSIS — N3000 Acute cystitis without hematuria: Secondary | ICD-10-CM

## 2020-06-27 DIAGNOSIS — M1712 Unilateral primary osteoarthritis, left knee: Secondary | ICD-10-CM | POA: Diagnosis not present

## 2020-06-27 DIAGNOSIS — J309 Allergic rhinitis, unspecified: Secondary | ICD-10-CM | POA: Diagnosis not present

## 2020-06-27 DIAGNOSIS — D631 Anemia in chronic kidney disease: Secondary | ICD-10-CM | POA: Diagnosis not present

## 2020-06-27 DIAGNOSIS — I872 Venous insufficiency (chronic) (peripheral): Secondary | ICD-10-CM | POA: Diagnosis not present

## 2020-06-27 DIAGNOSIS — Z87891 Personal history of nicotine dependence: Secondary | ICD-10-CM | POA: Diagnosis not present

## 2020-06-27 DIAGNOSIS — E1151 Type 2 diabetes mellitus with diabetic peripheral angiopathy without gangrene: Secondary | ICD-10-CM | POA: Diagnosis not present

## 2020-06-27 DIAGNOSIS — Z86718 Personal history of other venous thrombosis and embolism: Secondary | ICD-10-CM | POA: Diagnosis not present

## 2020-06-27 DIAGNOSIS — E1122 Type 2 diabetes mellitus with diabetic chronic kidney disease: Secondary | ICD-10-CM | POA: Diagnosis not present

## 2020-06-27 DIAGNOSIS — M109 Gout, unspecified: Secondary | ICD-10-CM | POA: Diagnosis not present

## 2020-06-27 DIAGNOSIS — Z7901 Long term (current) use of anticoagulants: Secondary | ICD-10-CM | POA: Diagnosis not present

## 2020-06-27 DIAGNOSIS — M4802 Spinal stenosis, cervical region: Secondary | ICD-10-CM | POA: Diagnosis not present

## 2020-06-27 DIAGNOSIS — J449 Chronic obstructive pulmonary disease, unspecified: Secondary | ICD-10-CM | POA: Diagnosis not present

## 2020-06-27 DIAGNOSIS — G4733 Obstructive sleep apnea (adult) (pediatric): Secondary | ICD-10-CM | POA: Diagnosis not present

## 2020-06-27 DIAGNOSIS — I890 Lymphedema, not elsewhere classified: Secondary | ICD-10-CM | POA: Diagnosis not present

## 2020-06-27 DIAGNOSIS — N184 Chronic kidney disease, stage 4 (severe): Secondary | ICD-10-CM | POA: Diagnosis not present

## 2020-06-27 DIAGNOSIS — I129 Hypertensive chronic kidney disease with stage 1 through stage 4 chronic kidney disease, or unspecified chronic kidney disease: Secondary | ICD-10-CM | POA: Diagnosis not present

## 2020-06-27 DIAGNOSIS — E1142 Type 2 diabetes mellitus with diabetic polyneuropathy: Secondary | ICD-10-CM | POA: Diagnosis not present

## 2020-06-27 DIAGNOSIS — Z96651 Presence of right artificial knee joint: Secondary | ICD-10-CM | POA: Diagnosis not present

## 2020-06-27 DIAGNOSIS — Z8616 Personal history of COVID-19: Secondary | ICD-10-CM | POA: Diagnosis not present

## 2020-06-27 NOTE — Addendum Note (Signed)
Addended by: [name] on: 06/27/2020 12:08 PM   Modules accepted: Orders

## 2020-06-28 ENCOUNTER — Emergency Department
Admission: EM | Admit: 2020-06-28 | Discharge: 2020-06-28 | Disposition: A | Discharge status: Home / Self Care | Payer: Medicare Other | Attending: Emergency Medicine | Admitting: Emergency Medicine

## 2020-06-28 ENCOUNTER — Ambulatory Visit: Payer: Medicare Other | Admitting: Family Medicine

## 2020-06-28 ENCOUNTER — Emergency Department: Payer: Medicare Other

## 2020-06-28 ENCOUNTER — Other Ambulatory Visit: Payer: Self-pay

## 2020-06-28 DIAGNOSIS — Z87891 Personal history of nicotine dependence: Secondary | ICD-10-CM | POA: Diagnosis not present

## 2020-06-28 DIAGNOSIS — R1030 Lower abdominal pain, unspecified: Secondary | ICD-10-CM | POA: Diagnosis present

## 2020-06-28 DIAGNOSIS — J45909 Unspecified asthma, uncomplicated: Secondary | ICD-10-CM | POA: Insufficient documentation

## 2020-06-28 DIAGNOSIS — R5381 Other malaise: Secondary | ICD-10-CM | POA: Diagnosis not present

## 2020-06-28 DIAGNOSIS — R059 Cough, unspecified: Secondary | ICD-10-CM | POA: Diagnosis not present

## 2020-06-28 DIAGNOSIS — Z79899 Other long term (current) drug therapy: Secondary | ICD-10-CM | POA: Diagnosis not present

## 2020-06-28 DIAGNOSIS — R52 Pain, unspecified: Secondary | ICD-10-CM | POA: Diagnosis not present

## 2020-06-28 DIAGNOSIS — N184 Chronic kidney disease, stage 4 (severe): Secondary | ICD-10-CM | POA: Diagnosis not present

## 2020-06-28 DIAGNOSIS — E1122 Type 2 diabetes mellitus with diabetic chronic kidney disease: Secondary | ICD-10-CM | POA: Diagnosis not present

## 2020-06-28 DIAGNOSIS — Z20822 Contact with and (suspected) exposure to covid-19: Secondary | ICD-10-CM | POA: Insufficient documentation

## 2020-06-28 DIAGNOSIS — Z743 Need for continuous supervision: Secondary | ICD-10-CM | POA: Diagnosis not present

## 2020-06-28 DIAGNOSIS — R6889 Other general symptoms and signs: Secondary | ICD-10-CM | POA: Diagnosis not present

## 2020-06-28 DIAGNOSIS — R1032 Left lower quadrant pain: Secondary | ICD-10-CM | POA: Diagnosis not present

## 2020-06-28 DIAGNOSIS — J9811 Atelectasis: Secondary | ICD-10-CM | POA: Diagnosis not present

## 2020-06-28 DIAGNOSIS — I129 Hypertensive chronic kidney disease with stage 1 through stage 4 chronic kidney disease, or unspecified chronic kidney disease: Secondary | ICD-10-CM | POA: Diagnosis not present

## 2020-06-28 DIAGNOSIS — M1612 Unilateral primary osteoarthritis, left hip: Secondary | ICD-10-CM | POA: Diagnosis not present

## 2020-06-28 DIAGNOSIS — R0902 Hypoxemia: Secondary | ICD-10-CM | POA: Diagnosis not present

## 2020-06-28 LAB — COMPREHENSIVE METABOLIC PANEL
ALT: 38 U/L (ref 0–44)
AST: 36 U/L (ref 15–41)
Albumin: 3.9 g/dL (ref 3.5–5.0)
Alkaline Phosphatase: 103 U/L (ref 38–126)
Anion gap: 8 (ref 5–15)
BUN: 46 mg/dL — ABNORMAL HIGH (ref 8–23)
CO2: 23 mmol/L (ref 22–32)
Calcium: 10 mg/dL (ref 8.9–10.3)
Chloride: 108 mmol/L (ref 98–111)
Creatinine, Ser: 1.72 mg/dL — ABNORMAL HIGH (ref 0.61–1.24)
GFR, Estimated: 40 mL/min — ABNORMAL LOW (ref >60)
Glucose, Bld: 95 mg/dL (ref 70–99)
Potassium: 5.4 mmol/L — ABNORMAL HIGH (ref 3.5–5.1)
Sodium: 139 mmol/L (ref 135–145)
Total Bilirubin: 0.6 mg/dL (ref 0.3–1.2)
Total Protein: 8.1 g/dL (ref 6.5–8.1)

## 2020-06-28 LAB — URINALYSIS, COMPLETE (UACMP) WITH MICROSCOPIC
Bacteria, UA: NONE SEEN
Bilirubin Urine: NEGATIVE
Glucose, UA: NEGATIVE mg/dL
Hgb urine dipstick: NEGATIVE
Ketones, ur: NEGATIVE mg/dL
Leukocytes,Ua: NEGATIVE
Nitrite: NEGATIVE
Protein, ur: 30 mg/dL — AB
Specific Gravity, Urine: 1.013 (ref 1.005–1.030)
Squamous Epithelial / HPF: NONE SEEN (ref 0–5)
pH: 5 (ref 5.0–8.0)

## 2020-06-28 LAB — CBC
HCT: 30.3 % — ABNORMAL LOW (ref 39.0–52.0)
Hemoglobin: 9.7 g/dL — ABNORMAL LOW (ref 13.0–17.0)
MCH: 22.6 pg — ABNORMAL LOW (ref 26.0–34.0)
MCHC: 32 g/dL (ref 30.0–36.0)
MCV: 70.5 fL — ABNORMAL LOW (ref 80.0–100.0)
Platelets: 186 10*3/uL (ref 150–400)
RBC: 4.3 MIL/uL (ref 4.22–5.81)
RDW: 19 % — ABNORMAL HIGH (ref 11.5–15.5)
WBC: 6.5 10*3/uL (ref 4.0–10.5)
nRBC: 0 % (ref 0.0–0.2)

## 2020-06-28 LAB — RESP PANEL BY RT-PCR (FLU A&B, COVID) ARPGX2
Influenza A by PCR: NEGATIVE
Influenza B by PCR: NEGATIVE
SARS Coronavirus 2 by RT PCR: NEGATIVE

## 2020-06-28 LAB — LIPASE, BLOOD: Lipase: 115 U/L — ABNORMAL HIGH (ref 11–51)

## 2020-06-28 MED ORDER — ACETAMINOPHEN 500 MG PO TABS
1000.0000 mg | ORAL_TABLET | Freq: Once | ORAL | Status: AC
  Administered 2020-06-28: 1000 mg via ORAL
  Filled 2020-06-28: qty 2

## 2020-06-28 NOTE — ED Provider Notes (Signed)
[facility] Emergency Department Provider Note  ____________________________________________   Event Date/Time   First MD Initiated Contact with Patient 06/28/20 1127     (approximate)  I have reviewed the triage vital signs and the nursing notes.   HISTORY  Chief Complaint Groin pain   HPI Justin Shannon. is a 80 y.o. male with history of seizures, DVT on Xarelto who comes in for groin pain.  Patient reports 3-4 weeks of left groin pain.  The pain seems to be more musculoskeletal in nature.  He does not have pain at rest.  He only has pain when he lifts his left leg up.  The daughter is at bedside who states that the pain seems to be worse in the morning after he is tossed and turned all night.  He does take arthritis Tylenol with some relief.  She reports that he has had a right knee replacement and now secondary to that he favors his left leg.  He still able to bear weight on the left leg.  There is no pain in his genitals.  There is no skin discoloration.  He had a fall a month ago but he landed on his butt did not hit his head or land on his hip.    She reports that he seems to be urinating more frequently and is current concerned about the possibility of UTI.  She reports that he seems to be more confused than normal that is intermittent although seems to be better at this time.  Has had a recent MRI, EEG and being seen by neurology.  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  She also reports that he has some growths in his perineum region that been there for some time now that they are waiting to get follow-up for her to figure out what they are.  She noticed a little bit of bright red blood on the diaper today.  He is nonambulatory at baseline.  Has been compliant with his Xarelto.  He does have a cough that seems to be worsening.          Past Medical History:  Diagnosis Date  . Arthritis   . Bilateral swelling of feet   . BPH (benign prostatic  hyperplasia)   . Chronic kidney disease   . DVT (deep venous thrombosis) (HCC)    right lower extremity  . History of gout   . Seizures ([place])   . Sleep apnea   . Tendonitis of foot    left    Patient Active Problem List   Diagnosis Date Noted  . Slurred speech 06/04/2020  . CKD (chronic kidney disease) stage 3, GFR 30-59 ml/min (HCC) 06/04/2020  . Essential hypertension 06/04/2020  . Hyperlipidemia 06/04/2020  . Lewy body dementia ([place]) 05/21/2020  . Hyperlipidemia associated with type 2 diabetes mellitus ([place]) 05/21/2020  . Status post total right knee replacement 01/22/2020  . Anemia of chronic kidney failure, stage 3 (moderate) ([place]) 01/16/2020  . Total knee replacement status 11/02/2019  . History of total knee arthroplasty 11/02/2019  . PAD (peripheral artery disease) ([place]) 10/13/2019  . Benign prostatic hyperplasia   . Seizures ([place])   . Anemia due to stage 4 chronic kidney disease ([place]) 06/24/2019  . Generalized weakness 06/24/2019  . Anemia in chronic kidney disease ([facility]) 06/24/2019  . Asthenia 06/24/2019  . Primary osteoarthritis of right knee 04/03/2019  . Ankle ulcer ([place]) 05/08/2018  . Use of cane as ambulatory aid 04/27/2018  .  Dependence on other enabling machines and devices 04/27/2018  . Type 2 diabetes mellitus with stage 4 chronic kidney disease, without long-term current use of insulin ([place]) 06/11/2017  . Lymphedema 05/04/2016  . Chronic venous insufficiency 05/04/2016  . Toe sprain 01/24/2016  . Morbid obesity with body mass index of 40.0-44.9 in adult [facility]) 05/24/2014  . Body mass index (BMI)40.0-44.9, adult 05/24/2014  . OSA on CPAP 01/10/2014  . CKD (chronic kidney disease), stage IV ([place]) 11/22/2013  . Allergic rhinitis 11/06/2013  . Benign hypertension with CKD (chronic kidney disease) stage IV ([place]) 11/06/2013  . Epilepsy ([place]) 11/06/2013  . Gout 11/06/2013  . History of DVT (deep vein thrombosis) 11/06/2013  . Chronic deep vein thrombosis  (DVT) of proximal vein of lower extremity ([place]) 11/06/2013  . 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 Personal history of other venous thrombosis and embolism 11/06/2013    Past Surgical History:  Procedure Laterality Date  . APPENDECTOMY    . APPLICATION OF WOUND VAC Right 11/02/2019   Procedure: APPLICATION OF WOUND VAC;  Surgeon: Dereck Leep, MD;  Location: [facility] ORS;  Service: Orthopedics;  Laterality: Right;  [id number]  . HERNIA REPAIR     UMBILICAL  . IVC FILTER INSERTION N/A 10/25/2019   Procedure: IVC FILTER INSERTION;  Surgeon: Katha Cabal, MD;  Location: [facility];  Service: Cardiovascular;  Laterality: N/A;  . KNEE ARTHROPLASTY Right 11/02/2019   Procedure: COMPUTER ASSISTED TOTAL KNEE ARTHROPLASTY;  Surgeon: Dereck Leep, MD;  Location: [facility] ORS;  Service: Orthopedics;  Laterality: Right;  . PATELLAR TENDON REPAIR Right 12/05/2019   Procedure: PATELLA TENDON REPAIR;  Surgeon: Dereck Leep, MD;  Location: [facility] ORS;  Service: Orthopedics;  Laterality: Right;  . PROSTATE SURGERY      Prior to Admission medications   Medication Sig Start Date End Date Taking? Authorizing Provider  albuterol (VENTOLIN HFA) 108 (90 Base) MCG/ACT inhaler Inhale 2 puffs into the lungs every 4 (four) hours as needed for wheezing.    [provider]  allopurinol (ZYLOPRIM) 100 MG tablet Take 100 mg by mouth daily.    [provider]  atorvastatin (LIPITOR) 20 MG tablet Take 1 tablet (20 mg total) by mouth at bedtime. 05/21/20   Karamalegos, Devonne Doughty, DO  docusate sodium (COLACE) 100 MG capsule Take 100 mg by mouth daily as needed for mild constipation or moderate constipation.     [provider]  fexofenadine (ALLEGRA) 180 MG tablet Take 180 mg by mouth daily as needed for allergies.     [provider]  fluticasone (FLONASE) 50 MCG/ACT nasal spray Place 2 sprays  into both nostrils daily as needed for allergies.  09/26/14   [provider]  Fluticasone-Salmeterol (ADVAIR) 250-50 MCG/DOSE AEPB Inhale 1 puff into the lungs 2 (two) times daily.    [provider]  gabapentin (NEURONTIN) 100 MG capsule Take 1 capsule (100 mg total) by mouth 2 (two) times daily. 06/06/20   Antonieta Pert, MD  lamoTRIgine (LAMICTAL) 25 MG tablet Take 6 tablets (150 mg total) by mouth 2 (two) times daily Follow titration instructions given in office by your neurology. 06/06/20   Antonieta Pert, MD  levETIRAcetam (KEPPRA) 500 MG tablet Take 500 mg by mouth 2 (two) times daily.  03/08/19   [provider]  Magnesium 250 MG TABS Take 250 mg by mouth daily.    [provider]  melatonin 5  MG TABS Take 5 mg by mouth at bedtime as needed (sleep).    [provider]  metoprolol succinate (TOPROL-XL) 50 MG 24 hr tablet Take 50 mg by mouth daily.  10/30/17   [provider]  Multiple Vitamin (ONE-A-DAY MENS PO) Take 1 tablet by mouth daily.     [provider]  QUEtiapine (SEROQUEL) 25 MG tablet Take 0.5 tablets (12.5 mg total) by mouth 2 (two) times daily. 06/06/20 07/06/20  Antonieta Pert, MD  XARELTO 10 MG TABS tablet Take 10 mg by mouth daily before breakfast. 04/21/20   [provider]    Allergies Shellfish allergy, Iodinated diagnostic agents, Nsaids, and Rivastigmine  Family History  Problem Relation Age of Onset  . Arthritis Mother   . Hypertension Mother   . Diabetes Brother     Social History Social History   Tobacco Use  . Smoking status: Former Smoker    Packs/day: 1.00    Years: 2.00    Pack years: 2.00    Types: Cigarettes    Quit date: 06/23/1968    Years since quitting: 52.0  . Smokeless tobacco: Never Used  . Tobacco comment: 25 years ago quit  Vaping Use  . Vaping Use: Never used  Substance Use Topics  . Alcohol use: No  . Drug use: No      Review of Systems Constitutional: No  fever/chills Eyes: No visual changes. ENT: No sore throat. Cardiovascular: Denies chest pain. Respiratory: Denies shortness of breath.  Positive cough Gastrointestinal: No abdominal pain.  No nausea, no vomiting.  No diarrhea.  No constipation. Genitourinary: Negative for dysuria.  Blood noted on perineum Musculoskeletal: Negative for back pain.  Left groin pain Skin: Negative for rash. Neurological: Negative for headaches, focal weakness or numbness. All other ROS negative ____________________________________________   PHYSICAL EXAM:  VITAL SIGNS: ED Triage Vitals  Enc Vitals Group     BP 06/28/20 0822 (!) 165/89     Pulse Rate 06/28/20 0822 61     Resp 06/28/20 0822 18     Temp 06/28/20 0822 97.8 F (36.6 C)     Temp Source 06/28/20 0822 Oral     SpO2 06/28/20 0822 99 %     Weight 06/28/20 0815 280 lb (127 kg)     Height 06/28/20 0815 5\' 10"  (1.778 m)     Head Circumference --      Peak Flow --      Pain Score 06/28/20 0815 0     Pain Loc --      Pain Edu? --      Excl. in [place]? --     Constitutional: Morbidly obese man, pleasant Eyes: Conjunctivae are normal. EOMI. Head: Atraumatic. Nose: No congestion/rhinnorhea. Mouth/Throat: Mucous membranes are moist.   Neck: No stridor. Trachea Midline. FROM Cardiovascular: Normal rate, regular rhythm. Grossly normal heart sounds.  Good peripheral circulation. Respiratory: Normal respiratory effort.  No retractions. Lungs CTAB.  Occasionally coughing Gastrointestinal: Soft and nontender. No distention. No abdominal bruits.  Musculoskeletal: Tenderness in the left groin area.  Pain with lifting the left leg up off the bed and with palpation over the left groin.  No redness or erythema. Neurologic:  Normal speech and language. No gross focal neurologic deficits are appreciated.  Skin:  Skin is warm, dry and intact. No rash noted. Psychiatric: Mood and affect are normal. Speech and behavior are normal. GU:  Perineum was examined  and there are some growths that daughter had mentioned are chronic in  nature.  It looks like one of them might have torn leading to a little bit of blood. No skin changes no tenderness noted.  Rectal exam was performed and there is no red blood per rectum.  Normal brown stool.   ____________________________________________   LABS (all labs ordered are listed, but only abnormal results are displayed)  Labs Reviewed  LIPASE, BLOOD - Abnormal; Notable for the following components:      Result Value   Lipase 115 (*)    All other components within normal limits  COMPREHENSIVE METABOLIC PANEL - Abnormal; Notable for the following components:   Potassium 5.4 (*)    BUN 46 (*)    Creatinine, Ser 1.72 (*)    GFR, Estimated 40 (*)    All other components within normal limits  CBC - Abnormal; Notable for the following components:   Hemoglobin 9.7 (*)    HCT 30.3 (*)    MCV 70.5 (*)    MCH 22.6 (*)    RDW 19.0 (*)    All other components within normal limits  URINALYSIS, COMPLETE (UACMP) WITH MICROSCOPIC   ____________________________________________    RADIOLOGY Robert Bellow, personally viewed and evaluated these images (plain radiographs) as part of my medical decision making, as well as reviewing the written report by the radiologist.  ED MD interpretation: No pneumonia, no fracture  Official radiology report(s): DG Chest 1 View  Result Date: 06/28/2020 CLINICAL DATA:  History of recent fall EXAM: CHEST  1 VIEW COMPARISON:  06/04/2020 FINDINGS: Cardiac shadow is stable. Aortic calcifications are noted. Stable right basilar atelectasis is noted. No bony abnormality is noted. IMPRESSION: Right basilar atelectasis stable from the prior exam. No acute abnormality noted. Electronically Signed   By: Inez Catalina M.D.   On: 06/28/2020 12:41   DG Hip Unilat W or Wo Pelvis 2-3 Views Left  Result Date: 06/28/2020 CLINICAL DATA:  Left-sided groin pain following fall several weeks ago, initial  encounter EXAM: DG HIP (WITH OR WITHOUT PELVIS) 2-3V LEFT COMPARISON:  None. FINDINGS: Pelvic ring is intact. Degenerative changes of the hip joints are noted bilaterally. No definitive fracture or dislocation is seen. No soft tissue abnormality is noted. IMPRESSION: Degenerative change without acute abnormality. Electronically Signed   By: Inez Catalina M.D.   On: 06/28/2020 12:43    ____________________________________________   PROCEDURES  Procedure(s) performed (including Critical Care):  Procedures   ____________________________________________   INITIAL IMPRESSION / ASSESSMENT AND PLAN / ED COURSE  Abdi Husak. was evaluated in Emergency Department on 06/28/2020 for the symptoms described in the history of present illness. He was evaluated in the context of the global COVID-19 pandemic, which necessitated consideration that the patient might be at risk for infection with the SARS-CoV-2 virus that causes COVID-19. Institutional protocols and algorithms that pertain to the evaluation of patients at risk for COVID-19 are in a state of rapid change based on information released by regulatory bodies including the CDC and federal and state organizations. These policies and algorithms were followed during the patient's care in the ED.    Patient is a 80 year old who comes in with left groin pain.  The pain seems to be more musculoskeletal in nature.  Is going on 3 to 4 weeks.  He has no redness or tenderness over his perineum to suggest Fournier's gangrene.  He is also nontender in his abdomen to suggest abdominal process.  The pain is not there at rest but only becomes apparent when his leg  is lifted up.  He has not had any falls onto his hip to suggest fracture but will get x-ray just to ensure.  Lower suspicion for occult fracture.  I suspect that this is more likely musculoskeletal in nature.  He does have a cough so we will get x-ray to evaluate for pneumonia and COVID swab.  He does have  some skin growths on his perineum that have been there for some time according to family.  It looks like one might have torn.  He has no rectal bleed to suggest it being secondary to his Xarelto.  Scant amount.  I do not think that this represents Fournier's gangrene given its been chronic in nature.  His groin pain has been there for many weeks now and he has no fever or white count to suggest any kind of infection.  Kidney function is around baseline at 1.72.  Potassium slightly elevated 5.4 BUN is around baseline.  Hemoglobin is at baseline at 9.7.  Discussed this result with the daughter and they have a nephrologist and will follow-up with.  Patient bladder scan was normal.  We will get COVID swab and chest x-ray to evaluate for COVID  Lipase is slightly elevated but not sure what to make of this.  He has no left upper quadrant tenderness  [name] x-ray of chest and leg were negative.  Reevaluated patient continues not to have any pain unless his leg is lifted up.  I had a lengthy discussion with daughter about further work-up.  Currently he is at his neurological baseline and has not had any falls and declined CT imaging of the head.  He also has no abdominal tenderness or evidence of infection and therefore going to hold off on CT imaging of his abdomen and pelvis.  I have low suspicion for occult fracture given he is able to bear some weight on the leg and the fact that he has not had any falls onto it.  At this time they feel comfortable going home and following up with her primary care doctor.  They understand return precautions such as fevers, rashes or worsening pain ____________________________________________   FINAL CLINICAL IMPRESSION(S) / ED DIAGNOSES   Final diagnoses:  Cough  Left inguinal pain      MEDICATIONS GIVEN DURING THIS VISIT:  Medications  acetaminophen (TYLENOL) tablet 1,000 mg (1,000 mg Oral Given 06/28/20 1150)     ED Discharge Orders    None        Note:  This document was prepared using Dragon voice recognition software and may include unintentional dictation errors.   Vanessa Chokio, MD 06/28/20 [phone]

## 2020-06-28 NOTE — ED Triage Notes (Addendum)
First Rn: pt BIB EMS with c/o R inner groin pain x 1 week. Per EMS VSS stable. Per EMS family reports more confused than normal, EMS reports A&O x4, per EMS pt with PCP appt today however family did not want to wait due to patient calling out in pain.  Pt c/o intermittent left groin pain for the past 2-3 weeks. Pt was taken to the BR and attempted to urinate but was unable to , states he did just use the BR before arrival. Pt is in NAD, pt is wearing a gate belt from home, states he is assisted to stand and walk by family at home

## 2020-06-28 NOTE — Discharge Instructions (Signed)
He can take Tylenol 1 g every 6-8 hours.   At this time suspect like it is most likely related to muscular given there are no signs of infection but if he develops fevers, abdominal pain, pain in his groin area he should return to the ER immediately.

## 2020-06-28 NOTE — ED Notes (Signed)
Bladder scan reading 81ml present

## 2020-06-29 ENCOUNTER — Other Ambulatory Visit: Payer: Self-pay | Admitting: Family Medicine

## 2020-06-29 ENCOUNTER — Ambulatory Visit: Payer: Medicare Other | Admitting: Pharmacist

## 2020-06-29 DIAGNOSIS — I825Y9 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: Secondary | ICD-10-CM

## 2020-06-29 DIAGNOSIS — I129 Hypertensive chronic kidney disease with stage 1 through stage 4 chronic kidney disease, or unspecified chronic kidney disease: Secondary | ICD-10-CM

## 2020-06-29 DIAGNOSIS — J4530 Mild persistent asthma, uncomplicated: Secondary | ICD-10-CM

## 2020-06-29 MED ORDER — BUDESONIDE-FORMOTEROL FUMARATE 160-4.5 MCG/ACT IN AERO: 2.0000 | INHALATION_SPRAY | Freq: Two times a day (BID) | RESPIRATORY_TRACT | 5 refills | Status: DC

## 2020-06-29 NOTE — Patient Instructions (Signed)
Thank you allowing the Chronic Care Management Team to be a part of your care! It was a pleasure speaking with you today!     CCM (Chronic Care Management) Team    Noreene Larsson RN, MSN, CCM Nurse Care Coordinator  [phone]   Harlow Asa PharmD  Clinical Pharmacist  [phone]   Eula Fried LCSW Clinical Social Worker [phone]  Visit Information  Patient Care Plan: General Pharmacy (Adult)    Problem Identified: Disease Progression     Long-Range Goal: Disease Progression Prevented or Minimized   Start Date: 06/18/2020  Expected End Date: 09/16/2020  Recent Progress: On track  Priority: High  Note:   Current Barriers:  . Unable to independently afford treatment regimen  Pharmacist Clinical Goal(s):  Marland Kitchen Over the next 90 days, patient will verbalize ability to afford treatment regimen through collaboration with PharmD and provider.   Interventions: . 1:1 collaboration with [name], DO regarding development and update of comprehensive plan of care as evidenced by provider attestation and co-signature . Inter-disciplinary care team collaboration (see longitudinal plan of care) . Perform chart review. Note patient seen in [facility] ED yesterday for groin pain.  o Daughter reports will follow up with Palliative Care/PCP office if has further concerns about groin pain or patient's urination . Comprehensive medication review performed; medication list updated in electronic medical record o Daughter reports that she manages medications for patient using weekly pillbox - Denies missed doses o Reports patient not currently taking Advair, as has continued using Dulera inhaler as given to patient at hospital discharge on 12/15 - Daughter asks about patient's long-acting inhaler being switched to meter dose inhaler form, as report dry powder inhaler (DPI) more difficult for patient to use - From review of formulary for patient's [place] website, Ruthe Mannan is a tier 4 option for patient - Will ask provider to consider switch to Symbicort or Advair HFA is a tier 3 non-DPI therapeutic alternative for patient o Reports stopped Seroquel as directed by Neurology o Reports patient taking levetiracetam and lamotrigine as discussed with Neurology.  - Per review of Neurology note from 12/23, provider indicates plan to continue lamotrigine, so he is able to eventually get off of Keppra.  - Encourage daughter to follow up with Neurologist as she states has questions about this long-term plan  Medication Assistance . Counsel on Medicare Part D plan per information from [employer] . Note patient has applied for patient assistance for Xarelto from [place] and Wynetta Emery in the past, but has been denied o Have reviewed eligibility criteria for this program, including out of pocket expenditure requirement for each calendar year . Caregiver confirms assisted patient with completing application for Extra Help through Social Security on 12/27 o Counsel daughter she should look for approval/denial letter in mail in ~4 weeks and importance of retaining letter.  Hypertension . Reports patient taking: metoprolol ER 50 mg daily . Reports has home automatic and manual blood pressure monitors . Encourage to monitor blood pressure at home, keep log and contact providers for readings outside of established parameters.  Marland Kitchen Counsel on BP monitoring technique  Patient Goals/Self-Care Activities . Over the next 90 days, patient will:  - collaborate with provider on medication access solutions  Follow Up Plan: Telephone follow up appointment with care management team member scheduled for: 07/25/2020 at 10 am     The patient verbalized understanding of instructions, educational materials, and care plan provided today and declined  offer to receive copy of patient instructions, educational materials, and care plan.    Harlow Asa,  PharmD, [place] [employer] [phone]

## 2020-06-29 NOTE — Chronic Care Management (AMB) (Signed)
Chronic Care Management   Pharmacy Note  06/29/2020 Name: [facility]. MRN: [id number] DOB: 05/01/1941  Subjective:  Justin Shannon. is a 80 y.o. year old male who is a primary care patient of [name], DO. The CCM team was consulted for assistance with chronic disease management and care coordination needs.    Engaged with patient by telephone for follow up visit in response to provider referral for pharmacy case management and/or care coordination services.   Consent to Services:  Patient was given information about Chronic Care Management services, agreed to services, and gave verbal consent prior to initiation of services on 06/12/2020. Please see initial visit note for detailed documentation.   Objective:  Lab Results  Component Value Date   CREATININE 1.72 (H) 06/28/2020   CREATININE 1.94 (H) 06/06/2020   CREATININE 1.86 (H) 06/06/2020    Lab Results  Component Value Date   HGBA1C 5.9 (H) 06/05/2020       Component Value Date/Time   CHOL 96 06/05/2020 0816   TRIG 81 06/05/2020 0816   HDL 57 06/05/2020 0816   CHOLHDL 1.7 06/05/2020 0816   VLDL 16 06/05/2020 0816   LDLCALC 23 06/05/2020 0816     BP Readings from Last 3 Encounters:  06/28/20 (!) 153/65  06/06/20 (!) 149/57  05/21/20 (!) 124/48    Assessment/Interventions: Review of patient past medical history, allergies, medications, health status, including review of consultants reports, laboratory and other test data, was performed as part of comprehensive evaluation and provision of chronic care management services.   SDOH (Social Determinants of Health) assessments and interventions performed:    CCM Care Plan  Allergies  Allergen Reactions  . Shellfish Allergy Anaphylaxis and Other (See Comments)    gout  . Iodinated Diagnostic Agents Other (See Comments)    Chronic kidney disease 3b  . Nsaids Other (See Comments)    Chronic kidney disease 3b  . Rivastigmine Other (See  Comments)    Side effect and not a true allergy    Medications Reviewed Today    Reviewed by Vella Raring, [place] (Pharmacist) on 06/29/20 at 20  Med List Status: <None>  Medication Order Taking? Sig Documenting Provider Last Dose Status Informant  albuterol (VENTOLIN HFA) 108 (90 Base) MCG/ACT inhaler [id number] Yes Inhale 2 puffs into the lungs every 4 (four) hours as needed for wheezing. [provider] Taking Active Family Member  allopurinol (ZYLOPRIM) 100 MG tablet [id number] Yes Take 100 mg by mouth daily. [provider] Taking Active Family Member  atorvastatin (LIPITOR) 20 MG tablet [id number] Yes Take 1 tablet (20 mg total) by mouth at bedtime. [name], DO Taking Active Family Member  docusate sodium (COLACE) 100 MG capsule [id number] Yes Take 100 mg by mouth daily as needed for mild constipation or moderate constipation.  [provider] Taking Active Family Member  fexofenadine (ALLEGRA) 180 MG tablet [id number] Yes Take 180 mg by mouth daily as needed for allergies.  [provider] Taking Active Family Member  fluticasone (FLONASE) 50 MCG/ACT nasal spray [id number] Yes Place 2 sprays into both nostrils daily as needed for allergies.  [provider] Taking Active Family Member           Med Note Vilma Prader, Darin Engels   Fri Jun 24, 2019 11:07 PM)    Fluticasone-Salmeterol (ADVAIR) 250-50 MCG/DOSE AEPB [id number]  Inhale 1 puff into the lungs 2 (two) times daily. [provider]  Active Family Member  gabapentin (NEURONTIN) 100  MG capsule [id number] No Take 1 capsule (100 mg total) by mouth 2 (two) times daily.  Patient not taking: Reported on 06/29/2020   Antonieta Pert, MD Not Taking Active   lamoTRIgine (LAMICTAL) 25 MG tablet [id number] Yes Take 6 tablets (150 mg total) by mouth 2 (two) times daily Follow titration instructions given in office by your neurology. Antonieta Pert, MD Taking Active   levETIRAcetam (KEPPRA) 500 MG  tablet [id number] Yes Take 500 mg by mouth 2 (two) times daily.  [provider] Taking Active Family Member  Magnesium 250 MG TABS [id number] Yes Take 250 mg by mouth daily. [provider] Taking Active Family Member  melatonin 5 MG TABS [id number] No Take 5 mg by mouth at bedtime as needed (sleep).  Patient not taking: Reported on 06/29/2020   [provider] Not Taking Active Family Member  metoprolol succinate (TOPROL-XL) 50 MG 24 hr tablet [id number] Yes Take 50 mg by mouth daily.  [provider] Taking Active Family Member  Multiple Vitamin (ONE-A-DAY MENS PO) [id number] Yes Take 1 tablet by mouth daily.  [provider] Taking Active Family Member  XARELTO 10 MG TABS tablet [id number] Yes Take 10 mg by mouth daily before breakfast. [provider] Taking Active Family Member          Patient Active Problem List   Diagnosis Date Noted  . Slurred speech 06/04/2020  . CKD (chronic kidney disease) stage 3, GFR 30-59 ml/min (HCC) 06/04/2020  . Essential hypertension 06/04/2020  . Hyperlipidemia 06/04/2020  . Lewy body dementia ([place]) 05/21/2020  . Hyperlipidemia associated with type 2 diabetes mellitus ([place]) 05/21/2020  . Status post total right knee replacement 01/22/2020  . Anemia of chronic kidney failure, stage 3 (moderate) ([place]) 01/16/2020  . Total knee replacement status 11/02/2019  . History of total knee arthroplasty 11/02/2019  . PAD (peripheral artery disease) ([place]) 10/13/2019  . Benign prostatic hyperplasia   . Seizures ([place])   . Anemia due to stage 4 chronic kidney disease ([place]) 06/24/2019  . Generalized weakness 06/24/2019  . Anemia in chronic kidney disease ([place]) 06/24/2019  . Asthenia 06/24/2019  . Primary osteoarthritis of right knee 04/03/2019  . Ankle ulcer ([place]) 05/08/2018  . Use of cane as ambulatory aid 04/27/2018  . Dependence on other enabling machines and devices 04/27/2018  . Type 2 diabetes mellitus with  stage 4 chronic kidney disease, without long-term current use of insulin ([place]) 06/11/2017  . Lymphedema 05/04/2016  . Chronic venous insufficiency 05/04/2016  . Toe sprain 01/24/2016  . Morbid obesity with body mass index of 40.0-44.9 in adult [facility]) 05/24/2014  . Body mass index (BMI)40.0-44.9, adult 05/24/2014  . OSA on CPAP 01/10/2014  . CKD (chronic kidney disease), stage IV ([place]) 11/22/2013  . Allergic rhinitis 11/06/2013  . Benign hypertension with CKD (chronic kidney disease) stage IV ([place]) 11/06/2013  . Epilepsy ([place]) 11/06/2013  . Gout 11/06/2013  . History of DVT (deep vein thrombosis) 11/06/2013  . Chronic deep vein thrombosis (DVT) of proximal vein of lower extremity ([place]) 11/06/2013  . 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 Personal history of other venous thrombosis and embolism 11/06/2013    Patient Care Plan: General Pharmacy (Adult)    Problem Identified: Disease Progression     Long-Range Goal: Disease Progression Prevented or Minimized   Start Date: 06/18/2020  Expected End Date: 09/16/2020  Recent Progress: On track  Priority: High  Note:  Current Barriers:  . Unable to independently afford treatment regimen  Pharmacist Clinical Goal(s):  Marland Kitchen Over the next 90 days, patient will verbalize ability to afford treatment regimen through collaboration with PharmD and provider.   Interventions: . 1:1 collaboration with [name], DO regarding development and update of comprehensive plan of care as evidenced by provider attestation and co-signature . Inter-disciplinary care team collaboration (see longitudinal plan of care) . Perform chart review. Note patient seen in [facility] ED yesterday for groin pain.  o Daughter reports will follow up with Palliative Care/PCP office if has further concerns about groin pain or patient's urination . Comprehensive medication review  performed; medication list updated in electronic medical record o Daughter reports that she manages medications for patient using weekly pillbox - Denies missed doses o Reports patient not currently taking Advair, as has continued using Dulera inhaler as given to patient at hospital discharge on 12/15 - Daughter asks about patient's long-acting inhaler being switched to meter dose inhaler form, as report dry powder inhaler (DPI) more difficult for patient to use - From review of formulary for patient's [place] website, Ruthe Mannan is a tier 4 option for patient - Will ask provider to consider switch to Symbicort or Advair HFA is a tier 3 non-DPI therapeutic alternative for patient o Reports stopped Seroquel as directed by Neurology o Reports patient taking levetiracetam and lamotrigine as discussed with Neurology.  - Per review of Neurology note from 12/23, provider indicates plan to continue lamotrigine, so he is able to eventually get off of Keppra.  - Encourage daughter to follow up with Neurologist as she states has questions about this long-term plan  Medication Assistance . Counsel on Medicare Part D plan per information from [employer] . Note patient has applied for patient assistance for Xarelto from [place] [employer] [place] in the past, but has been denied o Have reviewed eligibility criteria for this program, including out of pocket expenditure requirement for each calendar year . Caregiver confirms assisted patient with completing application for Extra Help through Social Security on 12/27 o Counsel daughter she should look for approval/denial letter in mail in ~4 weeks and importance of retaining letter.  Hypertension . Reports patient taking: metoprolol ER 50 mg daily . Reports has home automatic and manual blood pressure monitors . Encourage to monitor blood pressure at home, keep log and contact providers for readings outside of established parameters.   Marland Kitchen Counsel on BP monitoring technique  Patient Goals/Self-Care Activities . Over the next 90 days, patient will:  - collaborate with provider on medication access solutions  Follow Up Plan: Telephone follow up appointment with care management team member scheduled for: 07/25/2020 at 55 am    Harlow Asa, PharmD, [employer] [phone]

## 2020-07-02 ENCOUNTER — Other Ambulatory Visit: Payer: Self-pay | Admitting: Family Medicine

## 2020-07-02 ENCOUNTER — Ambulatory Visit: Payer: Self-pay | Admitting: Pharmacist

## 2020-07-02 DIAGNOSIS — U099 Other forms of dyspnea: Secondary | ICD-10-CM

## 2020-07-02 DIAGNOSIS — J9801 Acute bronchospasm: Secondary | ICD-10-CM

## 2020-07-02 NOTE — Patient Instructions (Addendum)
Visit Information  Patient Care Plan: General Social Work (Adult)    Problem Identified: Caregiver Stress     Goal: Caregiver Coping Optimized   Start Date: 06/14/2020  Priority: Medium  Note:   Evidence-based guidance:   Recognize and validate the complex nature of dementia, as well as the impact on the caregiver and extended family; provide education and support to align with needs and stage of dementia.   Acknowledge feelings of grief associated with dementia such as loss of relationship, recreational activities, future with patient and loss associated with out-of-home placement.   Encourage verbalization of feelings without ruminating.   Discuss services that may help balance the caregiving role and living the life he/she chooses such as professionally or peer-led support groups, web-based support, counseling and services such as respite care or in-home help.   Refer for or provide psychoeducation activities such as cognitive reframing to improve family/caregiver quality of life, wellbeing, confidence, perception of burden, mental health and self-efficacy.   Encourage use of individualized coping strategies that may include yoga, spiritual support, distraction, exercise, relaxation, music, massage, music therapy and outdoor activities to utilize nature's restorative properties.   Suggest home-monitoring system that may reduce family/caregiver worry and improve sleep.   Provide proactive acknowledgement of potential for depressive symptoms to appear; normalize response to burden of caregiving; refer to primary care provider or for mental health services.   Notes:    Task: Recognize and Manage Caregiver Stress   Note:   Care Management Activities:    - active listening utilized - caregiver stress acknowledged - complementary therapy use encouraged - consideration of in-home help encouraged - decision-making supported - engagement with primary care provider encouraged -  healthy lifestyle promoted - mental health treatment facilitated - participation in support group encouraged - positive reinforcement provided - self-reflection promoted - social and community activities encouraged - spiritual activity use promoted - support group information provided - verbalization of feelings encouraged    Notes:    Patient Care Plan: RNCM: Lewey Body Dementia (Adult)    Problem Identified: RNCM: Management of Lewey Body Dementia   Priority: High    Long-Range Goal: RNCM: Behavior Symptoms Management   Expected End Date: 10/12/2020  Priority: High  Note:   CARE PLAN ENTRY (see longtitudinal plan of care for additional care plan information)  Current Barriers:  . Chronic Disease Management support, education, and care coordination needs related to Dementia  Clinical Goal(s) related to Dementia: Lewey Body Dementia Over the next 120 days, patient will:  . Work with the care management team to address educational, disease management, and care coordination needs  . Begin or continue self health monitoring activities as directed today  support and education for patient and caregiver with diagnosis of Lewey body dementia and decline in health . Call provider office for new or worsened signs and symptoms New or worsened symptom related to dementia and changes in conditions . Call care management team with questions or concerns . Verbalize basic understanding of patient centered plan of care established today  Interventions related to Dementia:  . Evaluation of current treatment plans and patient's adherence to plan as established by provider.  06-14-2020: The patient saw pcp on 06-11-2020. Referral for CCM team support. The patient has been living with the daughter and her family since October. His wife is actively taking radiation treatments at this time and due to decline in the patients health the daughter has moved the patient in with her and her family. The  patient saw neurologist today and further testing will be done by an in-home study and a sub referral for additional diagnosis and treatment options.  . Assessed patient understanding of disease states.  The daughter verbalized the patient does not fully understand what is going on but goes to appointments, eats well, takes medications, and is working with OT/PT in the home. An aide come 2 times a week and bathes the patient.   . Assessed patient's education and care coordination needs.  The daughter spoke to the LCSW earlier today and care guide referral made. RNCM reviewed the role of the CCM team and support of the CCM team in the help of the patient and caregiver meeting the healthy and wellness needs of the patient.  . Provided disease specific education to patient.  Education given on resources available and how to contact the RNCM. Education on follow up calls and reaching out to the [facility] for new concerns before next outreach.  Nash Dimmer with appropriate clinical care team members regarding patient needs.  The patient is currently working with the LCSW. Will refer the pharmacist for help with medication cost constraints.  . - action plan for worsening symptoms mutually developed . - Financial risk analyst provided . - symptom review completed . -safety reviewed- the family uses baby monitors and knows when the patient attempts to get out of bed . -medication safety reviewed- the daughter given the patient his medications and manages his medications.   Patient Self Care Activities related to Dementia:  . Patient is unable to independently self-manage chronic health conditions  Follow up in the next 30 to 60 days    Task: RNCM: Develop Strategies to Manage Behavior   Note:   Care Management Activities:    - action plan for worsening symptoms mutually developed - community resource information provided - symptom review completed    Notes: The patient is going to have further  testing to evaluate dementia- saw neurologist today   Problem Identified: Caregiver Stress     Problem Identified: Harm or Injury     Patient Care Plan: RNCM: Management of Diabetes Type 2 (Adult)    Problem Identified: RNCM: Glycemic Management (Diabetes, Type 2)   Priority: Medium    Goal: RNCM: Management of DM   Priority: Medium  Note:   Objective:  Lab Results  Component Value Date   HGBA1C 5.9 (H) 06/05/2020 .   Lab Results  Component Value Date   CREATININE 1.94 (H) 06/06/2020   CREATININE 1.86 (H) 06/06/2020   CREATININE 1.79 (H) 06/04/2020 .   Marland Kitchen No results found for: EGFR Current Barriers:  Marland Kitchen Knowledge Deficits related to basic Diabetes pathophysiology and self care/management . Knowledge Deficits related to medications used for management of diabetes . Limited Social Support . Unable to independently manage DM as evidence of advancing dementia and inability to manage care without the help of his daughter . Unable to self administer medications as prescribed . Unable to perform ADLs independently . Unable to perform IADLs independently Case Manager Clinical Goal(s):  Marland Kitchen Over the next 120 days, patient will demonstrate improved adherence to prescribed treatment plan for diabetes self care/management as evidenced by:  . daily monitoring and recording of CBG  . adherence to ADA/ carb modified diet . adherence to prescribed medication regimen Interventions:  . Provided education to patient about basic DM disease process . Reviewed medications with patient and discussed importance of medication adherence . Discussed plans with patient for ongoing care management  follow up and provided patient with direct contact information for care management team . Provided patient with written educational materials related to hypo and hyperglycemia and importance of correct treatment . Advised patient, providing education and rationale, to check cbg daily and record, calling pcp for  findings outside established parameters.   . Review of patient status, including review of consultants reports, relevant laboratory and other test results, and medications completed. . - A1C testing facilitated . - barriers to adherence to treatment plan identified . - blood glucose readings reviewed . - resources required to improve adherence to care identified . - self-awareness of signs/symptoms of hypo or hyperglycemia encouraged Patient Goals/Self-Care Activities . Over the next 120 days, patient will:  - UNABLE to independently manage DM Self administers oral medications as prescribed Attends all scheduled provider appointments Checks blood sugars as prescribed and utilize hyper and hypoglycemia protocol as needed Adheres to prescribed ADA/carb modified Follow Up Plan: Telephone follow up appointment with care management team member scheduled for: 07-19-2020 at 0345 pm   Task: RNCM: Alleviate Barriers to Glycemic Management   Note:   Care Management Activities:    - A1C testing facilitated - barriers to adherence to treatment plan identified - blood glucose readings reviewed - resources required to improve adherence to care identified - self-awareness of signs/symptoms of hypo or hyperglycemia encouraged      Patient Care Plan: RNCM:Hypertension (Adult)    Problem Identified: Hypertension (Hypertension)     Goal: RNCM: Management of HTN   Expected End Date: 10/12/2020  Priority: Medium  Note:   Objective:  . Last practice recorded BP readings:  BP Readings from Last 3 Encounters:  06/06/20 (!) 149/57  05/21/20 (!) 124/48  05/02/20 (!) 156/82 .   Marland Kitchen Most recent eGFR/CrCl: No results found for: EGFR  No components found for: CRCL Current Barriers:  Marland Kitchen Knowledge Deficits related to basic understanding of hypertension pathophysiology and self care management . Knowledge Deficits related to understanding of medications prescribed for management of hypertension . 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 Unable to independently HTN as evidence of decline in health and having to move in with daughter to assist with care.  Leodis [place] social connections . Unable to perform ADLs independently . Unable to perform IADLs independently Case Manager Clinical Goal(s):  Marland Kitchen Over the next 120 days, patient will verbalize understanding of plan for hypertension management . Over the next 120 days, patient will demonstrate improved adherence to prescribed treatment plan for hypertension as evidenced by taking all medications as prescribed, monitoring and recording blood pressure as directed, adhering to low sodium/DASH diet . Over the next 120 days, patient will demonstrate improved health management independence as evidenced by checking blood pressure as directed and notifying PCP if SBP>160 or DBP > 90, taking all medications as prescribe, and adhering to a low sodium diet as discussed. Interventions:  Marland Kitchen UNABLE to independently:manage health and wellness due to decline in memory and recent moving in with his daughter to assist with his care . Evaluation of current treatment plan related to hypertension self management and patient's adherence to plan as established by provider. . Provided education to patient re: stroke prevention, s/s of heart attack and stroke, DASH diet, complications of uncontrolled blood pressure . Reviewed medications with patient and discussed importance of compliance. Pharmacist referral for questions and financial help for Xarelto  . Discussed plans with patient for ongoing care management follow up and provided patient with direct  contact information for care management team . Advised patient, providing education and rationale, to monitor blood pressure daily and record, calling PCP for findings outside established parameters.  . - blood pressure trends reviewed . - depression screen reviewed . - home or ambulatory blood  pressure monitoring encouraged Patient Goals/Self-Care Activities . Over the next 120 days, patient will:  - Self administers medications as prescribed Checks BP and records as discussed Follows a low sodium diet/DASH diet Follow Up Plan: Telephone follow up appointment with care management team member scheduled for: 07-19-2020 at 3:45pm   Task: RNCM: Identify and Monitor Blood Pressure Elevation   Note:   Care Management Activities:    - blood pressure trends reviewed - depression screen reviewed - home or ambulatory blood pressure monitoring encouraged      Patient Care Plan: General Pharmacy (Adult)    Problem Identified: Disease Progression     Long-Range Goal: Disease Progression Prevented or Minimized   Start Date: 06/18/2020  Expected End Date: 09/16/2020  Recent Progress: On track  Priority: High  Note:   Current Barriers:  . Unable to independently afford treatment regimen  Pharmacist Clinical Goal(s):  Marland Kitchen Over the next 90 days, patient will verbalize ability to afford treatment regimen through collaboration with PharmD and provider.   Interventions: . 1:1 collaboration with [name], DO regarding development and update of comprehensive plan of care as evidenced by provider attestation and co-signature . Inter-disciplinary care team collaboration (see longitudinal plan of care) . Collaborate with PCP to request change of patient's long-acting combination inhaler from Advair Diskus to a non-dry powder inhaler (DPI) alternative preferred through patient's health plan, such as Symbicort. Per daughter, patient finds a non-DPI form easier to use. o Provider agrees and Rx for Symbicort sent to patient's pharmacy . Follow up with patient's daughter today to let her know Symbicort inhaler has been sent to [facility] . Daughter reports patient originally started on Advair while at [facility] facility because patient had wheezing symptoms  post-COVID-19 infection . Daughter reports interest in having patient referral to Pulmonology for further evaluation o Will collaborate with PCP  Patient Goals/Self-Care Activities . Over the next 90 days, patient will:  - collaborate with provider on medication access solutions  Follow Up Plan: Telephone follow up appointment with care management team member scheduled for: 07/25/2020 at 10 am     The patient verbalized understanding of instructions, educational materials, and care plan provided today and declined offer to receive copy of patient instructions, educational materials, and care plan.   Harlow Asa, PharmD, [place] [phone]

## 2020-07-02 NOTE — Chronic Care Management (AMB) (Signed)
Care Management   Pharmacy Note  07/02/2020 Name: [facility] MRN: [id number] DOB: 09-Aug-1940  Subjective: Justin Shannon. is a 80 y.o. year old male who is a primary care patient of [name], DO. The Care Management team was consulted for assistance with care management and care coordination needs.    Engaged with caregiver by telephone for follow up visit in response to provider referral for pharmacy case management and/or care coordination services.   Consent to Services:  The patient was given information about Chronic Care Management services, agreed to services, and gave verbal consent prior to initiation of services.  Please see initial visit note for detailed documentation.   Patient agreed to services and consent obtained.  Review of patient status, including review of consultants reports, laboratory and other test data, was performed as part of comprehensive evaluation and provision of chronic care management services.   SDOH (Social Determinants of Health) assessments and interventions performed:    Objective:  Lab Results  Component Value Date   CREATININE 1.72 (H) 06/28/2020   CREATININE 1.94 (H) 06/06/2020   CREATININE 1.86 (H) 06/06/2020    Lab Results  Component Value Date   HGBA1C 5.9 (H) 06/05/2020       Component Value Date/Time   CHOL 96 06/05/2020 0816   TRIG 81 06/05/2020 0816   HDL 57 06/05/2020 0816   CHOLHDL 1.7 06/05/2020 0816   VLDL 16 06/05/2020 0816   LDLCALC 23 06/05/2020 0816     BP Readings from Last 3 Encounters:  06/28/20 (!) 153/65  06/06/20 (!) 149/57  05/21/20 (!) 124/48    Care Plan  Allergies  Allergen Reactions  . Shellfish Allergy Anaphylaxis and Other (See Comments)    gout  . Iodinated Diagnostic Agents Other (See Comments)    Chronic kidney disease 3b  . Nsaids Other (See Comments)    Chronic kidney disease 3b  . Rivastigmine Other (See Comments)    Side effect and not a true allergy     Medications Reviewed Today    Reviewed by Vella Raring, [place] (Pharmacist) on 06/29/20 at 39  Med List Status: <None>  Medication Order Taking? Sig Documenting Provider Last Dose Status Informant  albuterol (VENTOLIN HFA) 108 (90 Base) MCG/ACT inhaler [id number] Yes Inhale 2 puffs into the lungs every 4 (four) hours as needed for wheezing. [provider] Taking Active Family Member  allopurinol (ZYLOPRIM) 100 MG tablet [id number] Yes Take 100 mg by mouth daily. [provider] Taking Active Family Member  atorvastatin (LIPITOR) 20 MG tablet [id number] Yes Take 1 tablet (20 mg total) by mouth at bedtime. [name], DO Taking Active Family Member  docusate sodium (COLACE) 100 MG capsule [id number] Yes Take 100 mg by mouth daily as needed for mild constipation or moderate constipation.  [provider] Taking Active Family Member  fexofenadine (ALLEGRA) 180 MG tablet [id number] Yes Take 180 mg by mouth daily as needed for allergies.  [provider] Taking Active Family Member  fluticasone (FLONASE) 50 MCG/ACT nasal spray [id number] Yes Place 2 sprays into both nostrils daily as needed for allergies.  [provider] Taking Active Family Member           Med Note Vilma Prader, Darin Engels   Fri Jun 24, 2019 11:07 PM)    Fluticasone-Salmeterol (ADVAIR) 250-50 MCG/DOSE AEPB [id number]  Inhale 1 puff into the lungs 2 (two) times daily. [provider]  Active Family Member  gabapentin (NEURONTIN) 100 MG capsule  [id number] No Take 1 capsule (100 mg total) by mouth 2 (two) times daily.  Patient not taking: Reported on 06/29/2020   Antonieta Pert, MD Not Taking Active   lamoTRIgine (LAMICTAL) 25 MG tablet [id number] Yes Take 6 tablets (150 mg total) by mouth 2 (two) times daily Follow titration instructions given in office by your neurology. Antonieta Pert, MD Taking Active   levETIRAcetam (KEPPRA) 500 MG tablet [id number] Yes Take 500 mg by mouth 2 (two)  times daily.  [provider] Taking Active Family Member  Magnesium 250 MG TABS [id number] Yes Take 250 mg by mouth daily. [provider] Taking Active Family Member  melatonin 5 MG TABS [id number] No Take 5 mg by mouth at bedtime as needed (sleep).  Patient not taking: Reported on 06/29/2020   [provider] Not Taking Active Family Member  metoprolol succinate (TOPROL-XL) 50 MG 24 hr tablet [id number] Yes Take 50 mg by mouth daily.  [provider] Taking Active Family Member  Multiple Vitamin (ONE-A-DAY MENS PO) [id number] Yes Take 1 tablet by mouth daily.  [provider] Taking Active Family Member  XARELTO 10 MG TABS tablet [id number] Yes Take 10 mg by mouth daily before breakfast. [provider] Taking Active Family Member          Patient Active Problem List   Diagnosis Date Noted  . Slurred speech 06/04/2020  . CKD (chronic kidney disease) stage 3, GFR 30-59 ml/min (HCC) 06/04/2020  . Essential hypertension 06/04/2020  . Hyperlipidemia 06/04/2020  . Lewy body dementia ([place]) 05/21/2020  . Hyperlipidemia associated with type 2 diabetes mellitus ([place]) 05/21/2020  . Status post total right knee replacement 01/22/2020  . Anemia of chronic kidney failure, stage 3 (moderate) ([place]) 01/16/2020  . Total knee replacement status 11/02/2019  . History of total knee arthroplasty 11/02/2019  . PAD (peripheral artery disease) ([place]) 10/13/2019  . Benign prostatic hyperplasia   . Seizures ([place])   . Anemia due to stage 4 chronic kidney disease ([place]) 06/24/2019  . Generalized weakness 06/24/2019  . Anemia in chronic kidney disease ([place]) 06/24/2019  . Asthenia 06/24/2019  . Primary osteoarthritis of right knee 04/03/2019  . Ankle ulcer ([place]) 05/08/2018  . Use of cane as ambulatory aid 04/27/2018  . Dependence on other enabling machines and devices 04/27/2018  . Type 2 diabetes mellitus with stage 4 chronic kidney disease, without long-term  current use of insulin ([place]) 06/11/2017  . Lymphedema 05/04/2016  . Chronic venous insufficiency 05/04/2016  . Toe sprain 01/24/2016  . Morbid obesity with body mass index of 40.0-44.9 in adult [facility]) 05/24/2014  . Body mass index (BMI)40.0-44.9, adult 05/24/2014  . OSA on CPAP 01/10/2014  . CKD (chronic kidney disease), stage IV ([place]) 11/22/2013  . Allergic rhinitis 11/06/2013  . Benign hypertension with CKD (chronic kidney disease) stage IV ([place]) 11/06/2013  . Epilepsy ([place]) 11/06/2013  . Gout 11/06/2013  . History of DVT (deep vein thrombosis) 11/06/2013  . Chronic deep vein thrombosis (DVT) of proximal vein of lower extremity ([place]) 11/06/2013  . 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 Personal history of other venous thrombosis and embolism 11/06/2013     Patient Care Plan: General Pharmacy (Adult)    Problem Identified: Disease Progression     Long-Range Goal: Disease Progression Prevented or Minimized   Start Date: 06/18/2020  Expected End Date: 09/16/2020  Recent Progress: On track  Priority: High  Note:   Current  Barriers:  . Unable to independently afford treatment regimen  Pharmacist Clinical Goal(s):  Marland Kitchen Over the next 90 days, patient will verbalize ability to afford treatment regimen through collaboration with PharmD and provider.   Interventions: . 1:1 collaboration with [name], DO regarding development and update of comprehensive plan of care as evidenced by provider attestation and co-signature . Inter-disciplinary care team collaboration (see longitudinal plan of care) . Collaborate with PCP to request change of patient's long-acting combination inhaler from Advair Diskus to a non-dry powder inhaler (DPI) alternative preferred through patient's health plan, such as Symbicort. Per daughter, patient finds a non-DPI form easier to use. o Provider agrees and Rx for Symbicort  sent to patient's pharmacy . Follow up with patient's daughter today to let her know Symbicort inhaler has been sent to [facility] . Daughter reports patient originally started on Advair while at [facility] facility because patient had wheezing symptoms post-COVID-19 infection . Daughter reports interest in having patient referral to Pulmonology for further evaluation o Will collaborate with PCP  Patient Goals/Self-Care Activities . Over the next 90 days, patient will:  - collaborate with provider on medication access solutions  Follow Up Plan: Telephone follow up appointment with care management team member scheduled for: 07/25/2020 at 10 am     Follow Up:  Caregiver agrees to Care Plan and Follow-up.  Harlow Asa, PharmD, [place] [phone]

## 2020-07-04 ENCOUNTER — Telehealth: Payer: Self-pay | Admitting: Family Medicine

## 2020-07-04 ENCOUNTER — Telehealth: Payer: Self-pay

## 2020-07-04 DIAGNOSIS — N184 Chronic kidney disease, stage 4 (severe): Secondary | ICD-10-CM | POA: Diagnosis not present

## 2020-07-04 DIAGNOSIS — I872 Venous insufficiency (chronic) (peripheral): Secondary | ICD-10-CM | POA: Diagnosis not present

## 2020-07-04 DIAGNOSIS — Z7901 Long term (current) use of anticoagulants: Secondary | ICD-10-CM | POA: Diagnosis not present

## 2020-07-04 DIAGNOSIS — E1142 Type 2 diabetes mellitus with diabetic polyneuropathy: Secondary | ICD-10-CM | POA: Diagnosis not present

## 2020-07-04 DIAGNOSIS — E1122 Type 2 diabetes mellitus with diabetic chronic kidney disease: Secondary | ICD-10-CM | POA: Diagnosis not present

## 2020-07-04 DIAGNOSIS — I129 Hypertensive chronic kidney disease with stage 1 through stage 4 chronic kidney disease, or unspecified chronic kidney disease: Secondary | ICD-10-CM | POA: Diagnosis not present

## 2020-07-04 DIAGNOSIS — J449 Chronic obstructive pulmonary disease, unspecified: Secondary | ICD-10-CM | POA: Diagnosis not present

## 2020-07-04 DIAGNOSIS — Z96651 Presence of right artificial knee joint: Secondary | ICD-10-CM | POA: Diagnosis not present

## 2020-07-04 DIAGNOSIS — Z86718 Personal history of other venous thrombosis and embolism: Secondary | ICD-10-CM | POA: Diagnosis not present

## 2020-07-04 DIAGNOSIS — E1151 Type 2 diabetes mellitus with diabetic peripheral angiopathy without gangrene: Secondary | ICD-10-CM | POA: Diagnosis not present

## 2020-07-04 DIAGNOSIS — I890 Lymphedema, not elsewhere classified: Secondary | ICD-10-CM | POA: Diagnosis not present

## 2020-07-04 DIAGNOSIS — G4733 Obstructive sleep apnea (adult) (pediatric): Secondary | ICD-10-CM | POA: Diagnosis not present

## 2020-07-04 DIAGNOSIS — Z8616 Personal history of COVID-19: Secondary | ICD-10-CM | POA: Diagnosis not present

## 2020-07-04 DIAGNOSIS — J309 Allergic rhinitis, unspecified: Secondary | ICD-10-CM | POA: Diagnosis not present

## 2020-07-04 DIAGNOSIS — Z87891 Personal history of nicotine dependence: Secondary | ICD-10-CM | POA: Diagnosis not present

## 2020-07-04 DIAGNOSIS — M4802 Spinal stenosis, cervical region: Secondary | ICD-10-CM | POA: Diagnosis not present

## 2020-07-04 DIAGNOSIS — F0280 Dementia in other diseases classified elsewhere without behavioral disturbance: Secondary | ICD-10-CM

## 2020-07-04 DIAGNOSIS — M109 Gout, unspecified: Secondary | ICD-10-CM | POA: Diagnosis not present

## 2020-07-04 DIAGNOSIS — M1712 Unilateral primary osteoarthritis, left knee: Secondary | ICD-10-CM | POA: Diagnosis not present

## 2020-07-04 DIAGNOSIS — D631 Anemia in chronic kidney disease: Secondary | ICD-10-CM | POA: Diagnosis not present

## 2020-07-04 NOTE — Telephone Encounter (Signed)
° °  Telephone encounter was:  Successful.  07/04/2020 Name: [facility]. MRN: [id number] DOB: Aug 31, 1940  Justin Shannon. is a 80 y.o. year old male who is a primary care patient of [name], DO . The community resource team was consulted for assistance with Caregiver Stress  Care guide performed the following interventions: Patient provided with information about care guide support team and interviewed to confirm resource needs Investigation of community resources performed Pt's daughter Justin Shannon has called all the places that we have suggested for her father and he is currently working with 3 other social workers, and he is under palliative care because he is max assist. I called the [place] and they do not have any options either. .  Follow Up Plan:  At this time we cannot provide any other resources for this pt. Daughter has been advised and is working with other programs to help with her father's care. Closing referral.   [name], Care Management Phone: [phone] Email: [email or web address]

## 2020-07-04 NOTE — Telephone Encounter (Signed)
Copied from CRM #[id number]. Topic: General - Other >> Jul 04, 2020  3:37 PM Roberson, Nate A wrote: Anne from [facility] met with patient and family and the family is ready to pursue placement at an assisted living for memory care. Anne needs a specific diagnosis of dementia from PCP for patient Best contact [phone] 

## 2020-07-04 NOTE — Telephone Encounter (Signed)
Please call Webb Silversmith at [facility] back:  Current dementia diagnosis is listed in his problem list and on previous chart notes.  Diagnosis: Lewy Body Dementia (ICD10: G31.83 and F02.80)  Let me know if they need anything else.  Nobie Putnam, [facility] 07/04/2020, 5:56 PM

## 2020-07-05 NOTE — Telephone Encounter (Signed)
Thank you. I received the fax. Signed the forms. Gave to Rachell to scan and fax back to [facility].  Nobie Putnam, [facility] 07/05/2020, 4:29 PM

## 2020-07-05 NOTE — Telephone Encounter (Signed)
I spoke with Webb Silversmith this morning. She said she will be faxing over a FL2 form for you to complete. I will have that for you once it's received.

## 2020-07-06 DIAGNOSIS — J449 Chronic obstructive pulmonary disease, unspecified: Secondary | ICD-10-CM | POA: Diagnosis not present

## 2020-07-06 DIAGNOSIS — J309 Allergic rhinitis, unspecified: Secondary | ICD-10-CM | POA: Diagnosis not present

## 2020-07-06 DIAGNOSIS — G4733 Obstructive sleep apnea (adult) (pediatric): Secondary | ICD-10-CM | POA: Diagnosis not present

## 2020-07-06 DIAGNOSIS — D631 Anemia in chronic kidney disease: Secondary | ICD-10-CM | POA: Diagnosis not present

## 2020-07-06 DIAGNOSIS — E1151 Type 2 diabetes mellitus with diabetic peripheral angiopathy without gangrene: Secondary | ICD-10-CM | POA: Diagnosis not present

## 2020-07-06 DIAGNOSIS — M1712 Unilateral primary osteoarthritis, left knee: Secondary | ICD-10-CM | POA: Diagnosis not present

## 2020-07-06 DIAGNOSIS — M109 Gout, unspecified: Secondary | ICD-10-CM | POA: Diagnosis not present

## 2020-07-06 DIAGNOSIS — E1122 Type 2 diabetes mellitus with diabetic chronic kidney disease: Secondary | ICD-10-CM | POA: Diagnosis not present

## 2020-07-06 DIAGNOSIS — E1142 Type 2 diabetes mellitus with diabetic polyneuropathy: Secondary | ICD-10-CM | POA: Diagnosis not present

## 2020-07-06 DIAGNOSIS — Z8616 Personal history of COVID-19: Secondary | ICD-10-CM | POA: Diagnosis not present

## 2020-07-06 DIAGNOSIS — I890 Lymphedema, not elsewhere classified: Secondary | ICD-10-CM | POA: Diagnosis not present

## 2020-07-06 DIAGNOSIS — Z96651 Presence of right artificial knee joint: Secondary | ICD-10-CM | POA: Diagnosis not present

## 2020-07-06 DIAGNOSIS — I129 Hypertensive chronic kidney disease with stage 1 through stage 4 chronic kidney disease, or unspecified chronic kidney disease: Secondary | ICD-10-CM | POA: Diagnosis not present

## 2020-07-06 DIAGNOSIS — Z87891 Personal history of nicotine dependence: Secondary | ICD-10-CM | POA: Diagnosis not present

## 2020-07-06 DIAGNOSIS — I872 Venous insufficiency (chronic) (peripheral): Secondary | ICD-10-CM | POA: Diagnosis not present

## 2020-07-06 DIAGNOSIS — N184 Chronic kidney disease, stage 4 (severe): Secondary | ICD-10-CM | POA: Diagnosis not present

## 2020-07-06 DIAGNOSIS — M4802 Spinal stenosis, cervical region: Secondary | ICD-10-CM | POA: Diagnosis not present

## 2020-07-06 DIAGNOSIS — Z86718 Personal history of other venous thrombosis and embolism: Secondary | ICD-10-CM | POA: Diagnosis not present

## 2020-07-06 DIAGNOSIS — Z7901 Long term (current) use of anticoagulants: Secondary | ICD-10-CM | POA: Diagnosis not present

## 2020-07-09 DIAGNOSIS — G4733 Obstructive sleep apnea (adult) (pediatric): Secondary | ICD-10-CM | POA: Diagnosis not present

## 2020-07-09 DIAGNOSIS — E1142 Type 2 diabetes mellitus with diabetic polyneuropathy: Secondary | ICD-10-CM | POA: Diagnosis not present

## 2020-07-09 DIAGNOSIS — D631 Anemia in chronic kidney disease: Secondary | ICD-10-CM | POA: Diagnosis not present

## 2020-07-09 DIAGNOSIS — Z8616 Personal history of COVID-19: Secondary | ICD-10-CM | POA: Diagnosis not present

## 2020-07-09 DIAGNOSIS — E1151 Type 2 diabetes mellitus with diabetic peripheral angiopathy without gangrene: Secondary | ICD-10-CM | POA: Diagnosis not present

## 2020-07-09 DIAGNOSIS — Z96651 Presence of right artificial knee joint: Secondary | ICD-10-CM | POA: Diagnosis not present

## 2020-07-09 DIAGNOSIS — Z87891 Personal history of nicotine dependence: Secondary | ICD-10-CM | POA: Diagnosis not present

## 2020-07-09 DIAGNOSIS — J309 Allergic rhinitis, unspecified: Secondary | ICD-10-CM | POA: Diagnosis not present

## 2020-07-09 DIAGNOSIS — N184 Chronic kidney disease, stage 4 (severe): Secondary | ICD-10-CM | POA: Diagnosis not present

## 2020-07-09 DIAGNOSIS — I890 Lymphedema, not elsewhere classified: Secondary | ICD-10-CM | POA: Diagnosis not present

## 2020-07-09 DIAGNOSIS — J449 Chronic obstructive pulmonary disease, unspecified: Secondary | ICD-10-CM | POA: Diagnosis not present

## 2020-07-09 DIAGNOSIS — M109 Gout, unspecified: Secondary | ICD-10-CM | POA: Diagnosis not present

## 2020-07-09 DIAGNOSIS — I129 Hypertensive chronic kidney disease with stage 1 through stage 4 chronic kidney disease, or unspecified chronic kidney disease: Secondary | ICD-10-CM | POA: Diagnosis not present

## 2020-07-09 DIAGNOSIS — M1712 Unilateral primary osteoarthritis, left knee: Secondary | ICD-10-CM | POA: Diagnosis not present

## 2020-07-09 DIAGNOSIS — E1122 Type 2 diabetes mellitus with diabetic chronic kidney disease: Secondary | ICD-10-CM | POA: Diagnosis not present

## 2020-07-09 DIAGNOSIS — M4802 Spinal stenosis, cervical region: Secondary | ICD-10-CM | POA: Diagnosis not present

## 2020-07-09 DIAGNOSIS — Z7901 Long term (current) use of anticoagulants: Secondary | ICD-10-CM | POA: Diagnosis not present

## 2020-07-09 DIAGNOSIS — Z86718 Personal history of other venous thrombosis and embolism: Secondary | ICD-10-CM | POA: Diagnosis not present

## 2020-07-09 DIAGNOSIS — I872 Venous insufficiency (chronic) (peripheral): Secondary | ICD-10-CM | POA: Diagnosis not present

## 2020-07-11 DIAGNOSIS — I872 Venous insufficiency (chronic) (peripheral): Secondary | ICD-10-CM | POA: Diagnosis not present

## 2020-07-11 DIAGNOSIS — D631 Anemia in chronic kidney disease: Secondary | ICD-10-CM | POA: Diagnosis not present

## 2020-07-11 DIAGNOSIS — M4802 Spinal stenosis, cervical region: Secondary | ICD-10-CM | POA: Diagnosis not present

## 2020-07-11 DIAGNOSIS — I890 Lymphedema, not elsewhere classified: Secondary | ICD-10-CM | POA: Diagnosis not present

## 2020-07-11 DIAGNOSIS — Z96651 Presence of right artificial knee joint: Secondary | ICD-10-CM | POA: Diagnosis not present

## 2020-07-11 DIAGNOSIS — Z8616 Personal history of COVID-19: Secondary | ICD-10-CM | POA: Diagnosis not present

## 2020-07-11 DIAGNOSIS — Z87891 Personal history of nicotine dependence: Secondary | ICD-10-CM | POA: Diagnosis not present

## 2020-07-11 DIAGNOSIS — J309 Allergic rhinitis, unspecified: Secondary | ICD-10-CM | POA: Diagnosis not present

## 2020-07-11 DIAGNOSIS — I129 Hypertensive chronic kidney disease with stage 1 through stage 4 chronic kidney disease, or unspecified chronic kidney disease: Secondary | ICD-10-CM | POA: Diagnosis not present

## 2020-07-11 DIAGNOSIS — N184 Chronic kidney disease, stage 4 (severe): Secondary | ICD-10-CM | POA: Diagnosis not present

## 2020-07-11 DIAGNOSIS — M1712 Unilateral primary osteoarthritis, left knee: Secondary | ICD-10-CM | POA: Diagnosis not present

## 2020-07-11 DIAGNOSIS — M109 Gout, unspecified: Secondary | ICD-10-CM | POA: Diagnosis not present

## 2020-07-11 DIAGNOSIS — Z86718 Personal history of other venous thrombosis and embolism: Secondary | ICD-10-CM | POA: Diagnosis not present

## 2020-07-11 DIAGNOSIS — J449 Chronic obstructive pulmonary disease, unspecified: Secondary | ICD-10-CM | POA: Diagnosis not present

## 2020-07-11 DIAGNOSIS — E1122 Type 2 diabetes mellitus with diabetic chronic kidney disease: Secondary | ICD-10-CM | POA: Diagnosis not present

## 2020-07-11 DIAGNOSIS — E1151 Type 2 diabetes mellitus with diabetic peripheral angiopathy without gangrene: Secondary | ICD-10-CM | POA: Diagnosis not present

## 2020-07-11 DIAGNOSIS — Z7901 Long term (current) use of anticoagulants: Secondary | ICD-10-CM | POA: Diagnosis not present

## 2020-07-11 DIAGNOSIS — G4733 Obstructive sleep apnea (adult) (pediatric): Secondary | ICD-10-CM | POA: Diagnosis not present

## 2020-07-11 DIAGNOSIS — E1142 Type 2 diabetes mellitus with diabetic polyneuropathy: Secondary | ICD-10-CM | POA: Diagnosis not present

## 2020-07-12 ENCOUNTER — Encounter: Payer: Self-pay | Admitting: Primary Care

## 2020-07-12 DIAGNOSIS — E1122 Type 2 diabetes mellitus with diabetic chronic kidney disease: Secondary | ICD-10-CM | POA: Diagnosis not present

## 2020-07-12 DIAGNOSIS — S86811D Strain of other muscle(s) and tendon(s) at lower leg level, right leg, subsequent encounter: Secondary | ICD-10-CM | POA: Diagnosis not present

## 2020-07-12 DIAGNOSIS — N184 Chronic kidney disease, stage 4 (severe): Secondary | ICD-10-CM | POA: Diagnosis not present

## 2020-07-12 DIAGNOSIS — Z96651 Presence of right artificial knee joint: Secondary | ICD-10-CM | POA: Diagnosis not present

## 2020-07-12 DIAGNOSIS — I825Y9 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: Secondary | ICD-10-CM | POA: Diagnosis not present

## 2020-07-12 DIAGNOSIS — J4530 Mild persistent asthma, uncomplicated: Secondary | ICD-10-CM

## 2020-07-12 DIAGNOSIS — Z86718 Personal history of other venous thrombosis and embolism: Secondary | ICD-10-CM

## 2020-07-12 MED ORDER — XARELTO 10 MG PO TABS: 10.0000 mg | ORAL_TABLET | Freq: Every day | ORAL | 5 refills | Status: DC

## 2020-07-12 MED ORDER — FLOVENT HFA 110 MCG/ACT IN AERO: 1.0000 | INHALATION_SPRAY | Freq: Two times a day (BID) | RESPIRATORY_TRACT | 5 refills | Status: DC

## 2020-07-12 NOTE — Addendum Note (Signed)
Addended by: [name] on: 07/12/2020 02:46 PM   Modules accepted: Orders

## 2020-07-14 DIAGNOSIS — M7582 Other shoulder lesions, left shoulder: Secondary | ICD-10-CM | POA: Diagnosis not present

## 2020-07-14 DIAGNOSIS — S86811D Strain of other muscle(s) and tendon(s) at lower leg level, right leg, subsequent encounter: Secondary | ICD-10-CM | POA: Diagnosis not present

## 2020-07-14 DIAGNOSIS — M19122 Post-traumatic osteoarthritis, left elbow: Secondary | ICD-10-CM | POA: Diagnosis not present

## 2020-07-14 DIAGNOSIS — M7580 Other shoulder lesions, unspecified shoulder: Secondary | ICD-10-CM | POA: Diagnosis not present

## 2020-07-14 DIAGNOSIS — I890 Lymphedema, not elsewhere classified: Secondary | ICD-10-CM | POA: Diagnosis not present

## 2020-07-16 ENCOUNTER — Encounter: Payer: Self-pay | Admitting: Podiatry

## 2020-07-16 ENCOUNTER — Other Ambulatory Visit: Payer: Self-pay

## 2020-07-16 ENCOUNTER — Ambulatory Visit: Payer: Medicare Other | Admitting: Podiatry

## 2020-07-16 DIAGNOSIS — N183 Chronic kidney disease, stage 3 unspecified: Secondary | ICD-10-CM | POA: Diagnosis not present

## 2020-07-16 DIAGNOSIS — M1712 Unilateral primary osteoarthritis, left knee: Secondary | ICD-10-CM | POA: Diagnosis not present

## 2020-07-16 DIAGNOSIS — N184 Chronic kidney disease, stage 4 (severe): Secondary | ICD-10-CM | POA: Diagnosis not present

## 2020-07-16 DIAGNOSIS — G4733 Obstructive sleep apnea (adult) (pediatric): Secondary | ICD-10-CM | POA: Diagnosis not present

## 2020-07-16 DIAGNOSIS — Z86718 Personal history of other venous thrombosis and embolism: Secondary | ICD-10-CM | POA: Diagnosis not present

## 2020-07-16 DIAGNOSIS — D689 Coagulation defect, unspecified: Secondary | ICD-10-CM | POA: Diagnosis not present

## 2020-07-16 DIAGNOSIS — Z96651 Presence of right artificial knee joint: Secondary | ICD-10-CM | POA: Diagnosis not present

## 2020-07-16 DIAGNOSIS — I129 Hypertensive chronic kidney disease with stage 1 through stage 4 chronic kidney disease, or unspecified chronic kidney disease: Secondary | ICD-10-CM | POA: Diagnosis not present

## 2020-07-16 DIAGNOSIS — Z87891 Personal history of nicotine dependence: Secondary | ICD-10-CM | POA: Diagnosis not present

## 2020-07-16 DIAGNOSIS — M109 Gout, unspecified: Secondary | ICD-10-CM | POA: Diagnosis not present

## 2020-07-16 DIAGNOSIS — E1151 Type 2 diabetes mellitus with diabetic peripheral angiopathy without gangrene: Secondary | ICD-10-CM | POA: Diagnosis not present

## 2020-07-16 DIAGNOSIS — M79609 Pain in unspecified limb: Secondary | ICD-10-CM | POA: Diagnosis not present

## 2020-07-16 DIAGNOSIS — Z8616 Personal history of COVID-19: Secondary | ICD-10-CM | POA: Diagnosis not present

## 2020-07-16 DIAGNOSIS — D631 Anemia in chronic kidney disease: Secondary | ICD-10-CM | POA: Diagnosis not present

## 2020-07-16 DIAGNOSIS — M4802 Spinal stenosis, cervical region: Secondary | ICD-10-CM | POA: Diagnosis not present

## 2020-07-16 DIAGNOSIS — I872 Venous insufficiency (chronic) (peripheral): Secondary | ICD-10-CM | POA: Diagnosis not present

## 2020-07-16 DIAGNOSIS — E1122 Type 2 diabetes mellitus with diabetic chronic kidney disease: Secondary | ICD-10-CM

## 2020-07-16 DIAGNOSIS — B351 Tinea unguium: Secondary | ICD-10-CM | POA: Diagnosis not present

## 2020-07-16 DIAGNOSIS — E1142 Type 2 diabetes mellitus with diabetic polyneuropathy: Secondary | ICD-10-CM | POA: Diagnosis not present

## 2020-07-16 DIAGNOSIS — Z7901 Long term (current) use of anticoagulants: Secondary | ICD-10-CM | POA: Diagnosis not present

## 2020-07-16 DIAGNOSIS — I890 Lymphedema, not elsewhere classified: Secondary | ICD-10-CM | POA: Diagnosis not present

## 2020-07-16 DIAGNOSIS — J309 Allergic rhinitis, unspecified: Secondary | ICD-10-CM | POA: Diagnosis not present

## 2020-07-16 DIAGNOSIS — J449 Chronic obstructive pulmonary disease, unspecified: Secondary | ICD-10-CM | POA: Diagnosis not present

## 2020-07-16 NOTE — Progress Notes (Signed)
This patient returns to my office for at risk foot care.  This patient requires this care by a professional since this patient will be at risk due to having  Venous insufficiency , DVT, Diabetes and  CKD-4  This patient is unable to cut nails himself since the patient cannot reach his nails.These nails are painful walking and wearing shoes.  This patient presents for at risk foot care today.  Patient is taking xarelto. This patient is brought to the office in a wheelchair accompanied by caregiver.  General Appearance  Alert, conversant and in no acute stress.  Vascular  Dorsalis pedis  pulses are palpable  Bilaterally.  Posterior tibial pulses are absent  Bilateral.  Capillary return is within normal limits  bilaterally. Cold feet bilaterally.  Absent digital hair bilateral.  Neurologic  Senn-Weinstein monofilament wire test within normal limits  bilaterally. Muscle power within normal limits bilaterally.  Nails Thick disfigured discolored nails with subungual debris  from hallux to fifth toes bilaterally. No evidence of bacterial infection or drainage bilaterally.  Orthopedic  No limitations of motion  feet .  No crepitus or effusions noted.  No bony pathology or digital deformities noted.  Skin  normotropic skin with no porokeratosis noted bilaterally.  No signs of infections or ulcers noted.     Onychomycosis  Pain in right toes  Pain in left toes  Consent was obtained for treatment procedures.   Mechanical debridement of nails 1-5  bilaterally performed with a nail nipper.  Filed with dremel without incident. No infection or ulcer.     Return office visit   3   months                   Told patient to return for periodic foot care and evaluation due to potential at risk complications.   Gardiner Barefoot DPM

## 2020-07-17 ENCOUNTER — Ambulatory Visit: Payer: Medicare Other | Admitting: Licensed Clinical Social Worker

## 2020-07-17 DIAGNOSIS — Z7901 Long term (current) use of anticoagulants: Secondary | ICD-10-CM | POA: Diagnosis not present

## 2020-07-17 DIAGNOSIS — N184 Chronic kidney disease, stage 4 (severe): Secondary | ICD-10-CM

## 2020-07-17 DIAGNOSIS — J309 Allergic rhinitis, unspecified: Secondary | ICD-10-CM | POA: Diagnosis not present

## 2020-07-17 DIAGNOSIS — I129 Hypertensive chronic kidney disease with stage 1 through stage 4 chronic kidney disease, or unspecified chronic kidney disease: Secondary | ICD-10-CM

## 2020-07-17 DIAGNOSIS — E1142 Type 2 diabetes mellitus with diabetic polyneuropathy: Secondary | ICD-10-CM | POA: Diagnosis not present

## 2020-07-17 DIAGNOSIS — D631 Anemia in chronic kidney disease: Secondary | ICD-10-CM | POA: Diagnosis not present

## 2020-07-17 DIAGNOSIS — E1122 Type 2 diabetes mellitus with diabetic chronic kidney disease: Secondary | ICD-10-CM | POA: Diagnosis not present

## 2020-07-17 DIAGNOSIS — G4733 Obstructive sleep apnea (adult) (pediatric): Secondary | ICD-10-CM | POA: Diagnosis not present

## 2020-07-17 DIAGNOSIS — E1151 Type 2 diabetes mellitus with diabetic peripheral angiopathy without gangrene: Secondary | ICD-10-CM | POA: Diagnosis not present

## 2020-07-17 DIAGNOSIS — F0280 Dementia in other diseases classified elsewhere without behavioral disturbance: Secondary | ICD-10-CM

## 2020-07-17 DIAGNOSIS — Z86718 Personal history of other venous thrombosis and embolism: Secondary | ICD-10-CM | POA: Diagnosis not present

## 2020-07-17 DIAGNOSIS — Z8616 Personal history of COVID-19: Secondary | ICD-10-CM | POA: Diagnosis not present

## 2020-07-17 DIAGNOSIS — J449 Chronic obstructive pulmonary disease, unspecified: Secondary | ICD-10-CM | POA: Diagnosis not present

## 2020-07-17 DIAGNOSIS — Z87891 Personal history of nicotine dependence: Secondary | ICD-10-CM | POA: Diagnosis not present

## 2020-07-17 DIAGNOSIS — M1712 Unilateral primary osteoarthritis, left knee: Secondary | ICD-10-CM | POA: Diagnosis not present

## 2020-07-17 DIAGNOSIS — M109 Gout, unspecified: Secondary | ICD-10-CM | POA: Diagnosis not present

## 2020-07-17 DIAGNOSIS — I890 Lymphedema, not elsewhere classified: Secondary | ICD-10-CM | POA: Diagnosis not present

## 2020-07-17 DIAGNOSIS — I872 Venous insufficiency (chronic) (peripheral): Secondary | ICD-10-CM | POA: Diagnosis not present

## 2020-07-17 DIAGNOSIS — Z96651 Presence of right artificial knee joint: Secondary | ICD-10-CM | POA: Diagnosis not present

## 2020-07-17 DIAGNOSIS — M4802 Spinal stenosis, cervical region: Secondary | ICD-10-CM | POA: Diagnosis not present

## 2020-07-17 NOTE — Chronic Care Management (AMB) (Signed)
Chronic Care Management    Clinical Social Work Note  07/17/2020 Name: [facility]. MRN: [id number] DOB: 04-Mar-1941  Justin Shannon. is a 80 y.o. year old male who is a primary care patient of [name], DO. The CCM team was consulted to assist the patient with chronic disease management and/or care coordination needs related to: Level of Care Concerns.   Engaged with patient by telephone for follow up visit in response to provider referral for social work chronic care management and care coordination services.   Consent to Services:  The patient was given the following information about Chronic Care Management services today, agreed to services, and gave verbal consent: 1. CCM service includes personalized support from designated clinical staff supervised by the primary care provider, including individualized plan of care and coordination with other care providers 2. 24/7 contact phone numbers for assistance for urgent and routine care needs. 3. Service will only be billed when office clinical staff spend 20 minutes or more in a month to coordinate care. 4. Only one practitioner may furnish and bill the service in a calendar month. 5.The patient may stop CCM services at any time (effective at the end of the month) by phone call to the office staff. 6. The patient will be responsible for cost sharing (co-pay) of up to 20% of the service fee (after annual deductible is met). Patient agreed to services and consent obtained.  Patient agreed to services and consent obtained.   Assessment: Review of patient past medical history, allergies, medications, and health status, including review of relevant consultants reports was performed today as part of a comprehensive evaluation and provision of chronic care management and care coordination services.     SDOH (Social Determinants of Health) assessments and interventions performed:    Advanced Directives Status: See Care Plan for  related entries.  CCM Care Plan  Allergies  Allergen Reactions  . Shellfish Allergy Anaphylaxis and Other (See Comments)    gout  . Iodinated Diagnostic Agents Other (See Comments)    Chronic kidney disease 3b  . Nsaids Other (See Comments)    Chronic kidney disease 3b  . Rivastigmine Other (See Comments)    Side effect and not a true allergy    Outpatient Encounter Medications as of 07/17/2020  Medication Sig  . albuterol (VENTOLIN HFA) 108 (90 Base) MCG/ACT inhaler Inhale 2 puffs into the lungs every 4 (four) hours as needed for wheezing.  Marland Kitchen allopurinol (ZYLOPRIM) 100 MG tablet Take 100 mg by mouth daily.  Marland Kitchen atorvastatin (LIPITOR) 20 MG tablet Take 1 tablet (20 mg total) by mouth at bedtime.  . docusate sodium (COLACE) 100 MG capsule Take 100 mg by mouth daily as needed for mild constipation or moderate constipation.   . fexofenadine (ALLEGRA) 180 MG tablet Take 180 mg by mouth daily as needed for allergies.   . fluticasone (FLONASE) 50 MCG/ACT nasal spray Place 2 sprays into both nostrils daily as needed for allergies.   . fluticasone (FLOVENT HFA) 110 MCG/ACT inhaler Inhale 1 puff into the lungs in the morning and at bedtime.  . gabapentin (NEURONTIN) 100 MG capsule Take 1 capsule (100 mg total) by mouth 2 (two) times daily. (Patient not taking: Reported on 06/29/2020)  . lamoTRIgine (LAMICTAL) 25 MG tablet Take 6 tablets (150 mg total) by mouth 2 (two) times daily Follow titration instructions given in office by your neurology.  . levETIRAcetam (KEPPRA) 500 MG tablet Take 500 mg by mouth 2 (two) times daily.   Marland Kitchen  Magnesium 250 MG TABS Take 250 mg by mouth daily.  . melatonin 5 MG TABS Take 5 mg by mouth at bedtime as needed (sleep). (Patient not taking: Reported on 06/29/2020)  . metoprolol succinate (TOPROL-XL) 50 MG 24 hr tablet Take 50 mg by mouth daily.   . Multiple Vitamin (ONE-A-DAY MENS PO) Take 1 tablet by mouth daily.   Alveda Reasons 10 MG TABS tablet Take 1 tablet (10 mg total)  by mouth daily before breakfast.   No facility-administered encounter medications on file as of 07/17/2020.    Patient Active Problem List   Diagnosis Date Noted  . Slurred speech 06/04/2020  . CKD (chronic kidney disease) stage 3, GFR 30-59 ml/min (HCC) 06/04/2020  . Essential hypertension 06/04/2020  . Hyperlipidemia 06/04/2020  . Lewy body dementia without behavioral disturbance ([place]) 05/21/2020  . Hyperlipidemia associated with type 2 diabetes mellitus ([place]) 05/21/2020  . Status post total right knee replacement 01/22/2020  . Anemia of chronic kidney failure, stage 3 (moderate) ([place]) 01/16/2020  . Total knee replacement status 11/02/2019  . History of total knee arthroplasty 11/02/2019  . PAD (peripheral artery disease) ([place]) 10/13/2019  . Benign prostatic hyperplasia   . Seizures ([place])   . Anemia due to stage 4 chronic kidney disease ([place]) 06/24/2019  . Generalized weakness 06/24/2019  . Anemia in chronic kidney disease ([place]) 06/24/2019  . Asthenia 06/24/2019  . Primary osteoarthritis of right knee 04/03/2019  . Ankle ulcer ([place]) 05/08/2018  . Use of cane as ambulatory aid 04/27/2018  . Dependence on other enabling machines and devices 04/27/2018  . Type 2 diabetes mellitus with stage 4 chronic kidney disease, without long-term current use of insulin ([place]) 06/11/2017  . Lymphedema 05/04/2016  . Chronic venous insufficiency 05/04/2016  . Toe sprain 01/24/2016  . Morbid obesity with body mass index of 40.0-44.9 in adult [facility]) 05/24/2014  . Body mass index (BMI)40.0-44.9, adult 05/24/2014  . OSA on CPAP 01/10/2014  . CKD (chronic kidney disease), stage IV ([place]) 11/22/2013  . Allergic rhinitis 11/06/2013  . Benign hypertension with CKD (chronic kidney disease) stage IV ([place]) 11/06/2013  . Epilepsy ([place]) 11/06/2013  . Gout 11/06/2013  . History of DVT (deep vein thrombosis) 11/06/2013  . Chronic deep vein thrombosis (DVT) of proximal vein of lower extremity ([place]) 11/06/2013   . 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 Personal history of other venous thrombosis and embolism 11/06/2013    Conditions to be addressed/monitored: Dementia ; Level of care concerns  Care Plan : General Social Work (Adult)  Updates made by Greg Cutter, LCSW since 07/17/2020 12:00 AM    Problem: Caregiver Stress     Long-Range Goal: Caregiver Coping Optimized   Start Date: 06/14/2020  Priority: Medium  Note:   Evidence-based guidance:   Recognize and validate the complex nature of dementia, as well as the impact on the caregiver and extended family; provide education and support to align with needs and stage of dementia.   Acknowledge feelings of grief associated with dementia such as loss of relationship, recreational activities, future with patient and loss associated with out-of-home placement.   Encourage verbalization of feelings without ruminating.   Discuss services that may help balance the caregiving role and living the life he/she chooses such as professionally or peer-led support groups, web-based support, counseling and services such as respite care or in-home help.   Refer for or provide psychoeducation activities such as cognitive reframing  to improve family/caregiver quality of life, wellbeing, confidence, perception of burden, mental health and self-efficacy.   Encourage use of individualized coping strategies that may include yoga, spiritual support, distraction, exercise, relaxation, music, massage, music therapy and outdoor activities to utilize nature's restorative properties.   Suggest home-monitoring system that may reduce family/caregiver worry and improve sleep.   Provide proactive acknowledgement of potential for depressive symptoms to appear; normalize response to burden of caregiving; refer to primary care provider or for mental health services.   Notes:    Timeframe:   Long-Range Goal Priority:  Medium Start Date:  06/14/20, goal revisited on 07/17/20                        Expected End Date: 09/12/20                    Follow Up Date- 45 days from 07/17/20   - avoid busy places that are confusing such as the shopping malls - check hot water heater temperature, turn down if too hot - get rid of poisonous plants - keep medicine where it is safe or locked - keep power tools, knives and cleaning products in a safe place - put deadbolts either high or low on outside doors - put car keys out of sight - remove knobs from stove and oven - remove vitamins, medicine, sugar substitutes and seasonings from the kitchen table and counters - supervise the use of tobacco and alcohol - use a GPS device in the car - use an audio or video monitor - use appliances that have an auto shut-off feature - use motion-sensor alarms outdoors, in the bedroom and the kitchen - use seat cushions, floor mats and bed pads that are wired to alarm when getting up    Why is this important?    Safety is important when you or your loved one has dementia.   Eyesight, hearing and changes in feelings of hot and cold or depth-perception may occur.   Wandering or getting lost may happen.   You/your loved one may have to stop driving.   Taking steps to improve safety can prevent injuries.   It will also help you/your loved one feel more relaxed.   It will help you/your loved one be independent for a longer time.     Assessment, press & current barriers:  Patient unable to consistently perform activities of daily living and needs additional assistance and support in order to meet this unmet need . Limited social support, ADL IADL limitations, Social Isolation, Limited access to caregiver, Cognitive Deficits, Memory Deficits, Inability to perform ADL's independently, and Inability to perform IADL's independently . Unable to self administer medications as prescribed . Lacks social  connections  Clinical Goals:   Over the next 120 days, patient/caregiver will work with SW to address concerns related to care coordination needs, lack of a stable support network and lack of Brewing technologist. LCSW will assist patient in gaining additional support in order to maintain health and mental health appropriately   Over the next 120 days, patient will demonstrate improved adherence to self care as evidenced by implementing healthy self-care into his daily routine such as: attending all medical appointments, deep breathing exercises, taking time for self-reflection, taking medications as prescribed, drinking water and daily exercise to improve mobility and mood.   Over the next 120 days, patient will demonstrate improved health management independence as evidenced by implementing healthy self-care skills and  positive support/resource implementation into their daily routine to help cope with stressors and improve overall health and well-being   Over the next 120 days, patient or caregiver will verbalize basic understanding of depression/stress process and self health management plan as evidenced by his participation in development of long term plan of care and institution of self health management strategies  Interventions : . Assessed needs, level of care concerns, basic eligibility and provided education on available PCS resources within their area. . Patient has Lewy Body Dementia and is in need of a higher level of care/support. Family prefers in home support over LTC placement at this time. Reviewed community support options ( CAP, private pay, PACE program) . Will collaborate with primary care provider when needed . Daughter reports that they are looking into getting a foyer lift because patient is unable to use his left side of the body and is unable to ambulate out of the bed. . Daughter reports that family applied for Special Assistance Medicaid but several  facilities with memory care that they were looking into were only able to accept patients under private pay for 3 years until they would accept him for Medicaid.  . Tomorrow on 07/18/20 is patient's last day of [facility] because he is no longer making progress with therapy.  . Email sent to daughter on 07/17/20 with personal care service resources and ALF's that accept Medicaid  . Patient interviewed and appropriate assessments performed . Referred patient to community resources care guide team for assistance with in home support through [facility] . Care Guide referral placed in the past but they were unable to provide any further community resource assistance.  . Provided mental health counseling with regard to caregiver strain . Discussed plans with patient for ongoing care management follow up and provided patient with direct contact information for care management team . Assisted patient/caregiver with obtaining information about health plan benefits . Provided education and assistance to client regarding Advanced Directives. . Provided education to patient/caregiver regarding level of care options. . Provided education to patient/caregiver about Hospice and/or Palliative Care services . 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 Marland Kitchen Patient completed neurologist appointment on 06/14/20. Marland Kitchen CCM LCSW educated family on LTC placement for Memory Care. . Other interventions provided: Solution-Focused Strategies   Follow Up Plan:  . SW will follow up with patient by phone over the next quarter      Follow Up Plan: SW will follow up with patient by phone over the next quarter      Eula Fried, [place], [email or web address], [email or web address] Phone: [phone]

## 2020-07-18 DIAGNOSIS — Z8616 Personal history of COVID-19: Secondary | ICD-10-CM | POA: Diagnosis not present

## 2020-07-18 DIAGNOSIS — D631 Anemia in chronic kidney disease: Secondary | ICD-10-CM | POA: Diagnosis not present

## 2020-07-18 DIAGNOSIS — M109 Gout, unspecified: Secondary | ICD-10-CM | POA: Diagnosis not present

## 2020-07-18 DIAGNOSIS — Z96651 Presence of right artificial knee joint: Secondary | ICD-10-CM | POA: Diagnosis not present

## 2020-07-18 DIAGNOSIS — M4802 Spinal stenosis, cervical region: Secondary | ICD-10-CM | POA: Diagnosis not present

## 2020-07-18 DIAGNOSIS — Z86718 Personal history of other venous thrombosis and embolism: Secondary | ICD-10-CM | POA: Diagnosis not present

## 2020-07-18 DIAGNOSIS — Z7901 Long term (current) use of anticoagulants: Secondary | ICD-10-CM | POA: Diagnosis not present

## 2020-07-18 DIAGNOSIS — E1142 Type 2 diabetes mellitus with diabetic polyneuropathy: Secondary | ICD-10-CM | POA: Diagnosis not present

## 2020-07-18 DIAGNOSIS — J449 Chronic obstructive pulmonary disease, unspecified: Secondary | ICD-10-CM | POA: Diagnosis not present

## 2020-07-18 DIAGNOSIS — Z87891 Personal history of nicotine dependence: Secondary | ICD-10-CM | POA: Diagnosis not present

## 2020-07-18 DIAGNOSIS — I129 Hypertensive chronic kidney disease with stage 1 through stage 4 chronic kidney disease, or unspecified chronic kidney disease: Secondary | ICD-10-CM | POA: Diagnosis not present

## 2020-07-18 DIAGNOSIS — I872 Venous insufficiency (chronic) (peripheral): Secondary | ICD-10-CM | POA: Diagnosis not present

## 2020-07-18 DIAGNOSIS — G4733 Obstructive sleep apnea (adult) (pediatric): Secondary | ICD-10-CM | POA: Diagnosis not present

## 2020-07-18 DIAGNOSIS — I890 Lymphedema, not elsewhere classified: Secondary | ICD-10-CM | POA: Diagnosis not present

## 2020-07-18 DIAGNOSIS — S86811A Strain of other muscle(s) and tendon(s) at lower leg level, right leg, initial encounter: Secondary | ICD-10-CM | POA: Diagnosis not present

## 2020-07-18 DIAGNOSIS — E1122 Type 2 diabetes mellitus with diabetic chronic kidney disease: Secondary | ICD-10-CM | POA: Diagnosis not present

## 2020-07-18 DIAGNOSIS — J309 Allergic rhinitis, unspecified: Secondary | ICD-10-CM | POA: Diagnosis not present

## 2020-07-18 DIAGNOSIS — M1712 Unilateral primary osteoarthritis, left knee: Secondary | ICD-10-CM | POA: Diagnosis not present

## 2020-07-18 DIAGNOSIS — Z96659 Presence of unspecified artificial knee joint: Secondary | ICD-10-CM | POA: Diagnosis not present

## 2020-07-18 DIAGNOSIS — E1151 Type 2 diabetes mellitus with diabetic peripheral angiopathy without gangrene: Secondary | ICD-10-CM | POA: Diagnosis not present

## 2020-07-18 DIAGNOSIS — N184 Chronic kidney disease, stage 4 (severe): Secondary | ICD-10-CM | POA: Diagnosis not present

## 2020-07-19 ENCOUNTER — Telehealth: Payer: Self-pay | Admitting: General Practice

## 2020-07-19 ENCOUNTER — Ambulatory Visit: Payer: Self-pay | Admitting: General Practice

## 2020-07-19 DIAGNOSIS — G3183 Dementia with Lewy bodies: Secondary | ICD-10-CM

## 2020-07-19 DIAGNOSIS — E1122 Type 2 diabetes mellitus with diabetic chronic kidney disease: Secondary | ICD-10-CM

## 2020-07-19 DIAGNOSIS — N184 Chronic kidney disease, stage 4 (severe): Secondary | ICD-10-CM

## 2020-07-19 DIAGNOSIS — I129 Hypertensive chronic kidney disease with stage 1 through stage 4 chronic kidney disease, or unspecified chronic kidney disease: Secondary | ICD-10-CM

## 2020-07-19 DIAGNOSIS — F0280 Dementia in other diseases classified elsewhere without behavioral disturbance: Secondary | ICD-10-CM

## 2020-07-19 NOTE — Patient Instructions (Signed)
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 Chronic Disease Management support, education, and care coordination needs related to Dementia  Clinical Goal(s) related to Dementia: Lewey Body Dementia Over the next 120 days, patient will:  . Work with the care management team to address educational, disease management, and care coordination needs  . Begin or continue self health monitoring activities as directed today  support and education for patient and caregiver with diagnosis of Lewey body dementia and decline in health . Call provider office for new or worsened signs and symptoms New or worsened symptom related to dementia and changes in conditions . Call care management team with questions or concerns . Verbalize basic understanding of patient centered plan of care established today  Interventions related to Dementia:  . Evaluation of current treatment plans and patient's adherence to plan as established by provider.  06-14-2020: The patient saw pcp on 06-11-2020. Referral for CCM team support. The patient has been living with the daughter and her family since October. His wife is actively taking radiation treatments at this time and due to decline in the patients health the daughter has moved the patient in with her and her family. The patient saw neurologist today and further testing will be done by an in-home study and a sub referral for additional diagnosis and treatment options. 07-19-2020: The patient has had a further decline in dementia and is aggressive at times. He pulls his diaper off and is wetting the bed. His daughter has ask the other provider about getting a hoyer lift as  she has hurt herself when trying to lift the patient. The patient has become totally dependent on his daughter for feeding, bathroom needs and all care. The daughter is open for recommendations as home health stopped coming as of yesterday due to the patient not progressing in his care.  . Assessed patient understanding of disease states.  The daughter verbalized the patient does not fully understand what is going on but goes to appointments, eats well, takes medications, and is working with OT/PT in the home. An aide come 2 times a week and bathes the patient.  07-19-2020: The patient has had further decline in his condition. She called memory care places but they have recommended skilled nursing. Will collaborate with LCSW and pcp.  . Assessed patient's education and care coordination needs.  The daughter spoke to the LCSW earlier today and care guide referral made. RNCM reviewed the role of the CCM team and support of the CCM team in the help of the patient and caregiver meeting the healthy and wellness needs of the patient. 1--27-2022: Provided information to the patient daughter about [facility] helping with incontinence supplies. Also sent images of washable pads to the daughter to consider to help with patient and having incontinence episodes. The daughter is open to recommendations for help as she only has her husband helping her and he is a truck driver and not always home.  . Provided disease specific education to patient.  Education given on resources available and how to contact the RNCM. Education on follow up calls and reaching out to the [facility] for new concerns before next outreach. 07-19-2020: Additional information  provided to discuss with [facility] at upcoming visit the hospice benefit and instructed the patients daughter to write down questions to ask about recommendations. Will discuss with the pcp about a new FL2.  The patients daughter is aware that the pcp will be out of the office until  08-01-2020. Nash Dimmer with appropriate clinical care team members regarding patient needs.  The patient is currently working with the LCSW. Will refer the pharmacist for help with medication cost constraints. 07-19-2020: Co-collaboration with the LCSW on resources for LTC facility information and help. The LCSW plans to reach out to the patients daughter next week also.  . - action plan for worsening symptoms mutually developed . - Financial risk analyst provided . - symptom review completed . -safety reviewed- the family uses baby monitors and knows when the patient attempts to get out of bed . -medication safety reviewed- the daughter given the patient his medications and manages his medications.   Patient Self Care Activities related to Dementia:  . Patient is unable to independently self-manage chronic health conditions  08-16-2020 at 3:45 pm   Task: RNCM: Develop Strategies to Manage Behavior   Note:   Care Management Activities:    - action plan for worsening symptoms mutually developed - community resource information provided - symptom review completed    Notes: The patient is going to have further testing to evaluate dementia- saw neurologist today   Problem Identified: Caregiver Stress     Problem Identified: Harm or Injury     Patient Care Plan: RNCM: Management of Diabetes Type 2 (Adult)    Problem Identified: RNCM: Glycemic Management (Diabetes, Type 2)   Priority: Medium    Goal: RNCM: Management of DM   Priority: Medium  Note:   Objective:  Lab Results  Component Value Date   HGBA1C 5.9 (H) 06/05/2020 .   Lab Results  Component Value Date   CREATININE 1.94 (H) 06/06/2020   CREATININE 1.86 (H) 06/06/2020   CREATININE 1.79 (H) 06/04/2020 .   Marland Kitchen No results found for: EGFR Current Barriers:  Marland Kitchen Knowledge Deficits related to basic Diabetes pathophysiology and self care/management . Knowledge Deficits related to medications used for management of  diabetes . Limited Social Support . Unable to independently manage DM as evidence of advancing dementia and inability to manage care without the help of his daughter . Unable to self administer medications as prescribed . Unable to perform ADLs independently . Unable to perform IADLs independently Case Manager Clinical Goal(s):  Marland Kitchen Over the next 120 days, patient will demonstrate improved adherence to prescribed treatment plan for diabetes self care/management as evidenced by:  . daily monitoring and recording of CBG  . adherence to ADA/ carb modified diet . adherence to prescribed medication regimen Interventions:  . Provided education to patient about basic DM disease process . Reviewed medications with patient and discussed importance of medication adherence . Discussed plans with patient for ongoing care management follow up and provided patient with direct contact information for care management team . Provided patient with written educational materials related to hypo and hyperglycemia and importance of correct treatment . Advised patient, providing education and rationale, to check cbg daily and record, calling pcp for findings outside established parameters.   . Review of patient status, including review of consultants reports, relevant laboratory and other test results, and medications completed. . - A1C testing facilitated . - barriers to adherence to treatment plan identified . - blood glucose readings reviewed . - resources required  to improve adherence to care identified . - self-awareness of signs/symptoms of hypo or hyperglycemia encouraged Patient Goals/Self-Care Activities . Over the next 120 days, patient will:  - UNABLE to independently manage DM Self administers oral medications as prescribed Attends all scheduled provider appointments Checks blood sugars as prescribed and utilize hyper and hypoglycemia protocol as needed Adheres to prescribed ADA/carb modified Follow  Up Plan: Telephone follow up appointment with care management team member scheduled for: 08-16-2020 at 0345 pm   Task: RNCM: Alleviate Barriers to Glycemic Management   Note:   Care Management Activities:    - A1C testing facilitated - barriers to adherence to treatment plan identified - blood glucose readings reviewed - resources required to improve adherence to care identified - self-awareness of signs/symptoms of hypo or hyperglycemia encouraged      Patient Care Plan: RNCM:Hypertension (Adult)    Problem Identified: Hypertension (Hypertension)     Goal: RNCM: Management of HTN   Expected End Date: 10/12/2020  Priority: Medium  Note:   Objective:  . Last practice recorded BP readings:  BP Readings from Last 3 Encounters:  06/06/20 (!) 149/57  05/21/20 (!) 124/48  05/02/20 (!) 156/82 .   Marland Kitchen Most recent eGFR/CrCl: No results found for: EGFR  No components found for: CRCL Current Barriers:  Marland Kitchen Knowledge Deficits related to basic understanding of hypertension pathophysiology and self care management . Knowledge Deficits related to understanding of medications prescribed for management of hypertension . 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 Unable to independently HTN as evidence of decline in health and having to move in with daughter to assist with care.  [place] social connections . Unable to perform ADLs independently . Unable to perform IADLs independently Case Manager Clinical Goal(s):  Marland Kitchen Over the next 120 days, patient will verbalize understanding of plan for hypertension management . Over the next 120 days, patient will demonstrate improved adherence to prescribed treatment plan for hypertension as evidenced by taking all medications as prescribed, monitoring and recording blood pressure as directed, adhering to low sodium/DASH diet . Over the next 120 days, patient will demonstrate improved health management independence as evidenced by  checking blood pressure as directed and notifying PCP if SBP>160 or DBP > 90, taking all medications as prescribe, and adhering to a low sodium diet as discussed. Interventions:  Marland Kitchen UNABLE to independently:manage health and wellness due to decline in memory and recent moving in with his daughter to assist with his care . Evaluation of current treatment plan related to hypertension self management and patient's adherence to plan as established by provider. . Provided education to patient re: stroke prevention, s/s of heart attack and stroke, DASH diet, complications of uncontrolled blood pressure . Reviewed medications with patient and discussed importance of compliance. Pharmacist referral for questions and financial help for Xarelto  . Discussed plans with patient for ongoing care management follow up and provided patient with direct contact information for care management team . Review of dietary habits. The patient is having to be feed now as he can not see to feed himself. The daughter is having a hard time with caring for the patient.  . Advised patient, providing education and rationale, to monitor blood pressure daily and record, calling PCP for findings outside established parameters.  . - blood pressure trends reviewed . - depression screen reviewed . - home or ambulatory blood pressure monitoring encouraged Patient Goals/Self-Care Activities . Over the next 120 days, patient will:  -  Self administers medications as prescribed Checks BP and records as discussed Follows a low sodium diet/DASH diet Follow Up Plan: Telephone follow up appointment with care management team member scheduled for: 08-16-2020 at 3:45pm   Task: RNCM: Identify and Monitor Blood Pressure Elevation   Note:   Care Management Activities:    - blood pressure trends reviewed - depression screen reviewed - home or ambulatory blood pressure monitoring encouraged            [place], MSN, [place] Mobile: [phone]

## 2020-07-19 NOTE — Chronic Care Management (AMB) (Signed)
Chronic Care Management   CCM RN Visit Note  07/19/2020 Name: Justin Shannon. MRN: [id number] DOB: 1941-01-20  Subjective: Justin Shannon. is a 80 y.o. year old male who is a primary care patient of Justin Hauser, DO. The care management team was consulted for assistance with disease management and care coordination needs.    Engaged with patient by telephone for follow up visit in response to provider referral for case management and/or care coordination services.   Consent to Services:  The patient was given information about Chronic Care Management services, agreed to services, and gave verbal consent prior to initiation of services.  Please see initial visit note for detailed documentation.   Patient agreed to services and verbal consent obtained.   Assessment: Review of patient past medical history, allergies, medications, health status, including review of consultants reports, laboratory and other test data, was performed as part of comprehensive evaluation and provision of chronic care management services.   SDOH (Social Determinants of Health) assessments and interventions performed:    CCM Care Plan  Allergies  Allergen Reactions   Shellfish Allergy Anaphylaxis and Other (See Comments)    gout   Iodinated Diagnostic Agents Other (See Comments)    Chronic kidney disease 3b   Nsaids Other (See Comments)    Chronic kidney disease 3b   Rivastigmine Other (See Comments)    Side effect and not a true allergy    Outpatient Encounter Medications as of 07/19/2020  Medication Sig   albuterol (VENTOLIN HFA) 108 (90 Base) MCG/ACT inhaler Inhale 2 puffs into the lungs every 4 (four) hours as needed for wheezing.   allopurinol (ZYLOPRIM) 100 MG tablet Take 100 mg by mouth daily.   atorvastatin (LIPITOR) 20 MG tablet Take 1 tablet (20 mg total) by mouth at bedtime.   docusate sodium (COLACE) 100 MG capsule Take 100 mg by mouth daily as needed for mild  constipation or moderate constipation.    fexofenadine (ALLEGRA) 180 MG tablet Take 180 mg by mouth daily as needed for allergies.    fluticasone (FLONASE) 50 MCG/ACT nasal spray Place 2 sprays into both nostrils daily as needed for allergies.    fluticasone (FLOVENT HFA) 110 MCG/ACT inhaler Inhale 1 puff into the lungs in the morning and at bedtime.   gabapentin (NEURONTIN) 100 MG capsule Take 1 capsule (100 mg total) by mouth 2 (two) times daily. (Patient not taking: Reported on 06/29/2020)   lamoTRIgine (LAMICTAL) 25 MG tablet Take 6 tablets (150 mg total) by mouth 2 (two) times daily Follow titration instructions given in office by your neurology.   levETIRAcetam (KEPPRA) 500 MG tablet Take 500 mg by mouth 2 (two) times daily.    Magnesium 250 MG TABS Take 250 mg by mouth daily.   melatonin 5 MG TABS Take 5 mg by mouth at bedtime as needed (sleep). (Patient not taking: Reported on 06/29/2020)   metoprolol succinate (TOPROL-XL) 50 MG 24 hr tablet Take 50 mg by mouth daily.    Multiple Vitamin (ONE-A-DAY MENS PO) Take 1 tablet by mouth daily.    XARELTO 10 MG TABS tablet Take 1 tablet (10 mg total) by mouth daily before breakfast.   No facility-administered encounter medications on file as of 07/19/2020.    Patient Active Problem List   Diagnosis Date Noted   Slurred speech 06/04/2020   CKD (chronic kidney disease) stage 3, GFR 30-59 ml/min (HCC) 06/04/2020   Essential hypertension 06/04/2020   Hyperlipidemia 06/04/2020   Lewy body dementia  without behavioral disturbance ([place]) 05/21/2020   Hyperlipidemia associated with type 2 diabetes mellitus ([place]) 05/21/2020   Status post total right knee replacement 01/22/2020   Anemia of chronic kidney failure, stage 3 (moderate) ([place]) 01/16/2020   Total knee replacement status 11/02/2019   History of total knee arthroplasty 11/02/2019   PAD (peripheral artery disease) ([place]) 10/13/2019   Benign prostatic hyperplasia    Seizures  ([place])    Anemia due to stage 4 chronic kidney disease ([place]) 06/24/2019   Generalized weakness 06/24/2019   Anemia in chronic kidney disease ([place]) 06/24/2019   Asthenia 06/24/2019   Primary osteoarthritis of right knee 04/03/2019   Ankle ulcer ([place]) 05/08/2018   Use of cane as ambulatory aid 04/27/2018   Dependence on other enabling machines and devices 04/27/2018   Type 2 diabetes mellitus with stage 4 chronic kidney disease, without long-term current use of insulin ([place]) 06/11/2017   Lymphedema 05/04/2016   Chronic venous insufficiency 05/04/2016   Toe sprain 01/24/2016   Morbid obesity with body mass index of 40.0-44.9 in adult [facility]) 05/24/2014   Body mass index (BMI)40.0-44.9, adult 05/24/2014   OSA on CPAP 01/10/2014   CKD (chronic kidney disease), stage IV ([place]) 11/22/2013   Allergic rhinitis 11/06/2013   Benign hypertension with CKD (chronic kidney disease) stage IV ([place]) 11/06/2013   Epilepsy ([place]) 11/06/2013   Gout 11/06/2013   History of DVT (deep vein thrombosis) 11/06/2013   Chronic deep vein thrombosis (DVT) of proximal vein of lower extremity ([place]) 11/06/2013   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 ([place]) 11/06/2013   Personal history of other venous thrombosis and embolism 11/06/2013    Conditions to be addressed/monitored:HTN, DMII and Dementia  Care Plan : RNCM: Lewey Body Dementia (Adult)  Updates made by Vanita Ingles since 07/19/2020 12:00 AM    Problem: RNCM: Management of Lewey Body Dementia   Priority: High    Long-Range Goal: RNCM: Behavior Symptoms Management   Expected End Date: 10/12/2020  Priority: High  Note:   CARE PLAN ENTRY (see longtitudinal plan of care for additional care plan information)  Current Barriers:   Chronic Disease Management support, education, and care coordination needs related to Dementia  Clinical Goal(s) related to Dementia: Lewey Body  Dementia Over the next 120 days, patient will:   Work with the care management team to address educational, disease management, and care coordination needs   Begin or continue self health monitoring activities as directed today  support and education for patient and caregiver with diagnosis of Lewey body dementia and decline in health  Call provider office for new or worsened signs and symptoms New or worsened symptom related to dementia and changes in conditions  Call care management team with questions or concerns  Verbalize basic understanding of patient centered plan of care established today  Interventions related to Dementia:   Evaluation of current treatment plans and patient's adherence to plan as established by provider.  06-14-2020: The patient saw pcp on 06-11-2020. Referral for CCM team support. The patient has been living with the daughter and her family since October. His wife is actively taking radiation treatments at this time and due to decline in the patients health the daughter has moved the patient in with her and her family. The patient saw neurologist today and further testing will be done by an in-home study and a sub referral for additional diagnosis and treatment options. 07-19-2020: The patient has had a further decline  in dementia and is aggressive at times. He pulls his diaper off and is wetting the bed. His daughter has ask the other provider about getting a hoyer lift as she has hurt herself when trying to lift the patient. The patient has become totally dependent on his daughter for feeding, bathroom needs and all care. The daughter is open for recommendations as home health stopped coming as of yesterday due to the patient not progressing in his care.   Assessed patient understanding of disease states.  The daughter verbalized the patient does not fully understand what is going on but goes to appointments, eats well, takes medications, and is working with OT/PT in the  home. An aide come 2 times a week and bathes the patient.  07-19-2020: The patient has had further decline in his condition. She called memory care places but they have recommended skilled nursing. Will collaborate with LCSW and pcp.   Assessed patient's education and care coordination needs.  The daughter spoke to the LCSW earlier today and care guide referral made. RNCM reviewed the role of the CCM team and support of the CCM team in the help of the patient and caregiver meeting the healthy and wellness needs of the patient. 1--27-2022: Provided information to the patient daughter about [facility] helping with incontinence supplies. Also sent images of washable pads to the daughter to consider to help with patient and having incontinence episodes. The daughter is open to recommendations for help as she only has her husband helping her and he is a truck driver and not always home.   Provided disease specific education to patient.  Education given on resources available and how to contact the RNCM. Education on follow up calls and reaching out to the [place] for new concerns before next outreach. 07-19-2020: Additional information provided to discuss with [facility] at upcoming visit the hospice benefit and instructed the patients daughter to write down questions to ask about recommendations. Will discuss with the pcp about a new FL2.  The patients daughter is aware that the pcp will be out of the office until 08-01-2020.  Collaborated with appropriate clinical care team members regarding patient needs.  The patient is currently working with the LCSW. Will refer the pharmacist for help with medication cost constraints. 07-19-2020: Co-collaboration with the LCSW on resources for LTC facility information and help. The LCSW plans to reach out to the patients daughter next week also.   - action plan for worsening symptoms mutually developed  - community resource information provided  - symptom review  completed  -safety reviewed- the family uses baby monitors and knows when the patient attempts to get out of bed  -medication safety reviewed- the daughter given the patient his medications and manages his medications.   Patient Self Care Activities related to Dementia:   Patient is unable to independently self-manage chronic health conditions  08-16-2020 at 3:45 pm   Care Plan : RNCM: Management of Diabetes Type 2 (Adult)  Updates made by Vanita Ingles since 07/19/2020 12:00 AM    Problem: RNCM: Glycemic Management (Diabetes, Type 2)   Priority: Medium    Goal: RNCM: Management of DM   Priority: Medium  Note:   Objective:  Lab Results  Component Value Date   HGBA1C 5.9 (H) 06/05/2020    Lab Results  Component Value Date   CREATININE 1.94 (H) 06/06/2020   CREATININE 1.86 (H) 06/06/2020   CREATININE 1.79 (H) 06/04/2020     No results found for: EGFR Current  Barriers:   Knowledge Deficits related to basic Diabetes pathophysiology and self care/management  Knowledge Deficits related to medications used for management of diabetes  Limited Social Support  Unable to independently manage DM as evidence of advancing dementia and inability to manage care without the help of his daughter  Unable to self administer medications as prescribed  Unable to perform ADLs independently  Unable to perform IADLs independently Case Manager Clinical Goal(s):   Over the next 120 days, patient will demonstrate improved adherence to prescribed treatment plan for diabetes self care/management as evidenced by:   daily monitoring and recording of CBG   adherence to ADA/ carb modified diet  adherence to prescribed medication regimen Interventions:   Provided education to patient about basic DM disease process  Reviewed medications with patient and discussed importance of medication adherence  Discussed plans with patient for ongoing care management follow up and provided patient  with direct contact information for care management team  Provided patient with written educational materials related to hypo and hyperglycemia and importance of correct treatment  Advised patient, providing education and rationale, to check cbg daily and record, calling pcp for findings outside established parameters.    Review of patient status, including review of consultants reports, relevant laboratory and other test results, and medications completed.  - A1C testing facilitated  - barriers to adherence to treatment plan identified  - blood glucose readings reviewed  - resources required to improve adherence to care identified  - self-awareness of signs/symptoms of hypo or hyperglycemia encouraged Patient Goals/Self-Care Activities  Over the next 120 days, patient will:  - UNABLE to independently manage DM Self administers oral medications as prescribed Attends all scheduled provider appointments Checks blood sugars as prescribed and utilize hyper and hypoglycemia protocol as needed Adheres to prescribed ADA/carb modified Follow Up Plan: Telephone follow up appointment with care management team member scheduled for: 08-16-2020 at 0345 pm   Care Plan : RNCM:Hypertension (Adult)  Updates made by Vanita Ingles since 07/19/2020 12:00 AM    Problem: Hypertension (Hypertension)     Goal: RNCM: Management of HTN   Expected End Date: 10/12/2020  Priority: Medium  Note:   Objective:   Last practice recorded BP readings:  BP Readings from Last 3 Encounters:  06/06/20 (!) 149/57  05/21/20 (!) 124/48  05/02/20 (!) 156/82     Most recent eGFR/CrCl: No results found for: EGFR  No components found for: CRCL Current Barriers:   Knowledge Deficits related to basic understanding of hypertension pathophysiology and self care management  Knowledge Deficits related to understanding of medications prescribed for management of hypertension  Limited Social Personnel officer. Xarelto cost prohibitive  Unable to independently HTN as evidence of decline in health and having to move in with daughter to assist with care.   Lacks social connections  Unable to perform ADLs independently  Unable to perform IADLs independently Case Manager Clinical Goal(s):   Over the next 120 days, patient will verbalize understanding of plan for hypertension management  Over the next 120 days, patient will demonstrate improved adherence to prescribed treatment plan for hypertension as evidenced by taking all medications as prescribed, monitoring and recording blood pressure as directed, adhering to low sodium/DASH diet  Over the next 120 days, patient will demonstrate improved health management independence as evidenced by checking blood pressure as directed and notifying PCP if SBP>160 or DBP > 90, taking all medications as prescribe, and adhering to a low sodium diet as discussed.  Interventions:   UNABLE to independently:manage health and wellness due to decline in memory and recent moving in with his daughter to assist with his care  Evaluation of current treatment plan related to hypertension self management and patient's adherence to plan as established by provider.  Provided education to patient re: stroke prevention, s/s of heart attack and stroke, DASH diet, complications of uncontrolled blood pressure  Reviewed medications with patient and discussed importance of compliance. Pharmacist referral for questions and financial help for Xarelto   Discussed plans with patient for ongoing care management follow up and provided patient with direct contact information for care management team  Review of dietary habits. The patient is having to be feed now as he can not see to feed himself. The daughter is having a hard time with caring for the patient.   Advised patient, providing education and rationale, to monitor blood pressure daily and record, calling PCP for  findings outside established parameters.   - blood pressure trends reviewed  - depression screen reviewed  - home or ambulatory blood pressure monitoring encouraged Patient Goals/Self-Care Activities  Over the next 120 days, patient will:  - Self administers medications as prescribed Checks BP and records as discussed Follows a low sodium diet/DASH diet Follow Up Plan: Telephone follow up appointment with care management team member scheduled for: 08-16-2020 at 3:45pm     Plan:Telephone follow up appointment with care management team member scheduled for:  08-16-2020 at 3:45 pm  Noreene Larsson RN, MSN, [place] Mobile: [phone]

## 2020-07-20 ENCOUNTER — Encounter: Payer: Self-pay | Admitting: Primary Care

## 2020-07-20 ENCOUNTER — Encounter: Payer: Self-pay | Admitting: Family Medicine

## 2020-07-20 ENCOUNTER — Telehealth: Payer: Self-pay | Admitting: Primary Care

## 2020-07-20 DIAGNOSIS — J449 Chronic obstructive pulmonary disease, unspecified: Secondary | ICD-10-CM | POA: Insufficient documentation

## 2020-07-20 NOTE — Telephone Encounter (Signed)
Rec'd MyChart message from daughter needing to reschedule the Palliative f/u visit on 07/27/19.  Spoke with daughter and we have rescheduled the visit for 08/02/20 @ 2 PM.

## 2020-07-21 ENCOUNTER — Emergency Department: Payer: Medicare Other

## 2020-07-21 ENCOUNTER — Encounter: Payer: Self-pay | Admitting: Emergency Medicine

## 2020-07-21 ENCOUNTER — Observation Stay
Admission: EM | Admit: 2020-07-21 | Discharge: 2020-07-22 | Disposition: A | Discharge status: Home / Self Care | Payer: Medicare Other | Attending: Internal Medicine | Admitting: Internal Medicine

## 2020-07-21 DIAGNOSIS — I129 Hypertensive chronic kidney disease with stage 1 through stage 4 chronic kidney disease, or unspecified chronic kidney disease: Secondary | ICD-10-CM | POA: Insufficient documentation

## 2020-07-21 DIAGNOSIS — N184 Chronic kidney disease, stage 4 (severe): Secondary | ICD-10-CM | POA: Diagnosis not present

## 2020-07-21 DIAGNOSIS — Z79899 Other long term (current) drug therapy: Secondary | ICD-10-CM | POA: Insufficient documentation

## 2020-07-21 DIAGNOSIS — J449 Chronic obstructive pulmonary disease, unspecified: Secondary | ICD-10-CM | POA: Diagnosis not present

## 2020-07-21 DIAGNOSIS — Z20822 Contact with and (suspected) exposure to covid-19: Secondary | ICD-10-CM | POA: Insufficient documentation

## 2020-07-21 DIAGNOSIS — Z7901 Long term (current) use of anticoagulants: Secondary | ICD-10-CM | POA: Diagnosis not present

## 2020-07-21 DIAGNOSIS — E860 Dehydration: Secondary | ICD-10-CM | POA: Diagnosis not present

## 2020-07-21 DIAGNOSIS — I700 Atherosclerosis of aorta: Secondary | ICD-10-CM | POA: Diagnosis not present

## 2020-07-21 DIAGNOSIS — R109 Unspecified abdominal pain: Secondary | ICD-10-CM | POA: Diagnosis not present

## 2020-07-21 DIAGNOSIS — E1122 Type 2 diabetes mellitus with diabetic chronic kidney disease: Secondary | ICD-10-CM | POA: Diagnosis not present

## 2020-07-21 DIAGNOSIS — I10 Essential (primary) hypertension: Secondary | ICD-10-CM | POA: Diagnosis not present

## 2020-07-21 DIAGNOSIS — R5381 Other malaise: Secondary | ICD-10-CM | POA: Diagnosis not present

## 2020-07-21 DIAGNOSIS — Z86718 Personal history of other venous thrombosis and embolism: Secondary | ICD-10-CM | POA: Diagnosis not present

## 2020-07-21 DIAGNOSIS — G9341 Metabolic encephalopathy: Secondary | ICD-10-CM | POA: Diagnosis not present

## 2020-07-21 DIAGNOSIS — G40909 Epilepsy, unspecified, not intractable, without status epilepticus: Secondary | ICD-10-CM | POA: Diagnosis not present

## 2020-07-21 DIAGNOSIS — Z96651 Presence of right artificial knee joint: Secondary | ICD-10-CM | POA: Insufficient documentation

## 2020-07-21 DIAGNOSIS — E785 Hyperlipidemia, unspecified: Secondary | ICD-10-CM | POA: Diagnosis not present

## 2020-07-21 DIAGNOSIS — R4182 Altered mental status, unspecified: Secondary | ICD-10-CM | POA: Diagnosis present

## 2020-07-21 DIAGNOSIS — Z87891 Personal history of nicotine dependence: Secondary | ICD-10-CM | POA: Insufficient documentation

## 2020-07-21 DIAGNOSIS — Z95828 Presence of other vascular implants and grafts: Secondary | ICD-10-CM | POA: Diagnosis not present

## 2020-07-21 DIAGNOSIS — Z743 Need for continuous supervision: Secondary | ICD-10-CM | POA: Diagnosis not present

## 2020-07-21 DIAGNOSIS — R404 Transient alteration of awareness: Secondary | ICD-10-CM | POA: Diagnosis not present

## 2020-07-21 DIAGNOSIS — G9340 Encephalopathy, unspecified: Secondary | ICD-10-CM

## 2020-07-21 LAB — BASIC METABOLIC PANEL
Anion gap: 10 (ref 5–15)
BUN: 33 mg/dL — ABNORMAL HIGH (ref 8–23)
CO2: 25 mmol/L (ref 22–32)
Calcium: 10.3 mg/dL (ref 8.9–10.3)
Chloride: 104 mmol/L (ref 98–111)
Creatinine, Ser: 2.02 mg/dL — ABNORMAL HIGH (ref 0.61–1.24)
GFR, Estimated: 33 mL/min — ABNORMAL LOW (ref >60)
Glucose, Bld: 91 mg/dL (ref 70–99)
Potassium: 4.8 mmol/L (ref 3.5–5.1)
Sodium: 139 mmol/L (ref 135–145)

## 2020-07-21 LAB — CBC WITH DIFFERENTIAL/PLATELET
Abs Immature Granulocytes: 0.01 10*3/uL (ref 0.00–0.07)
Basophils Absolute: 0 10*3/uL (ref 0.0–0.1)
Basophils Relative: 1 %
Eosinophils Absolute: 0.2 10*3/uL (ref 0.0–0.5)
Eosinophils Relative: 3 %
HCT: 33.6 % — ABNORMAL LOW (ref 39.0–52.0)
Hemoglobin: 10.5 g/dL — ABNORMAL LOW (ref 13.0–17.0)
Immature Granulocytes: 0 %
Lymphocytes Relative: 27 %
Lymphs Abs: 1.6 10*3/uL (ref 0.7–4.0)
MCH: 22.2 pg — ABNORMAL LOW (ref 26.0–34.0)
MCHC: 31.3 g/dL (ref 30.0–36.0)
MCV: 71.2 fL — ABNORMAL LOW (ref 80.0–100.0)
Monocytes Absolute: 0.7 10*3/uL (ref 0.1–1.0)
Monocytes Relative: 12 %
Neutro Abs: 3.6 10*3/uL (ref 1.7–7.7)
Neutrophils Relative %: 57 %
Platelets: 197 10*3/uL (ref 150–400)
RBC: 4.72 MIL/uL (ref 4.22–5.81)
RDW: 18.6 % — ABNORMAL HIGH (ref 11.5–15.5)
WBC: 6.1 10*3/uL (ref 4.0–10.5)
nRBC: 0 % (ref 0.0–0.2)

## 2020-07-21 LAB — URINALYSIS, COMPLETE (UACMP) WITH MICROSCOPIC
Bilirubin Urine: NEGATIVE
Glucose, UA: NEGATIVE mg/dL
Hgb urine dipstick: NEGATIVE
Ketones, ur: NEGATIVE mg/dL
Leukocytes,Ua: NEGATIVE
Nitrite: NEGATIVE
Protein, ur: 100 mg/dL — AB
Specific Gravity, Urine: 1.018 (ref 1.005–1.030)
pH: 6 (ref 5.0–8.0)

## 2020-07-21 LAB — CBG MONITORING, ED: Glucose-Capillary: 81 mg/dL (ref 70–99)

## 2020-07-21 LAB — COMPREHENSIVE METABOLIC PANEL
ALT: 30 U/L (ref 0–44)
AST: 49 U/L — ABNORMAL HIGH (ref 15–41)
Albumin: 3.8 g/dL (ref 3.5–5.0)
Alkaline Phosphatase: 93 U/L (ref 38–126)
Anion gap: 13 (ref 5–15)
BUN: 34 mg/dL — ABNORMAL HIGH (ref 8–23)
CO2: 23 mmol/L (ref 22–32)
Calcium: 10.2 mg/dL (ref 8.9–10.3)
Chloride: 102 mmol/L (ref 98–111)
Creatinine, Ser: 2.03 mg/dL — ABNORMAL HIGH (ref 0.61–1.24)
GFR, Estimated: 33 mL/min — ABNORMAL LOW (ref >60)
Glucose, Bld: 88 mg/dL (ref 70–99)
Potassium: 6.1 mmol/L — ABNORMAL HIGH (ref 3.5–5.1)
Sodium: 138 mmol/L (ref 135–145)
Total Bilirubin: 0.9 mg/dL (ref 0.3–1.2)
Total Protein: 8 g/dL (ref 6.5–8.1)

## 2020-07-21 LAB — LACTIC ACID, PLASMA: Lactic Acid, Venous: 1.2 mmol/L (ref 0.5–1.9)

## 2020-07-21 MED ORDER — LAMOTRIGINE 25 MG PO TABS: 150.0000 mg | ORAL_TABLET | Freq: Once | ORAL | Status: DC

## 2020-07-21 MED ORDER — SODIUM CHLORIDE 0.9 % IV SOLN: INTRAVENOUS | Status: AC

## 2020-07-21 MED ORDER — LEVETIRACETAM IN NACL 1000 MG/100ML IV SOLN
1000.0000 mg | Freq: Once | INTRAVENOUS | Status: AC
  Administered 2020-07-21: 1000 mg via INTRAVENOUS
  Filled 2020-07-21: qty 100

## 2020-07-21 MED ORDER — SODIUM CHLORIDE 0.9 % IV SOLN: INTRAVENOUS | Status: DC

## 2020-07-21 MED ORDER — HALOPERIDOL LACTATE 5 MG/ML IJ SOLN
2.0000 mg | Freq: Once | INTRAMUSCULAR | Status: AC
  Administered 2020-07-21: 2 mg via INTRAVENOUS
  Filled 2020-07-21: qty 1

## 2020-07-21 MED ORDER — SODIUM CHLORIDE 0.9 % IV BOLUS
1000.0000 mL | Freq: Once | INTRAVENOUS | Status: AC
  Administered 2020-07-21: 1000 mL via INTRAVENOUS

## 2020-07-21 MED ORDER — CALCIUM GLUCONATE-NACL 1-0.675 GM/50ML-% IV SOLN
1.0000 g | Freq: Once | INTRAVENOUS | Status: AC
  Administered 2020-07-21: 1000 mg via INTRAVENOUS
  Filled 2020-07-21: qty 50

## 2020-07-21 MED ORDER — LORAZEPAM 2 MG/ML IJ SOLN
0.5000 mg | Freq: Once | INTRAMUSCULAR | Status: AC
  Administered 2020-07-21: 0.5 mg via INTRAVENOUS
  Filled 2020-07-21: qty 1

## 2020-07-21 NOTE — H&P (Incomplete)
History and Physical    [facility]. AST:[id number] DOB: 1940/08/31 DOA: 07/21/2020  PCP: [name], DO  Patient coming from: Home, lives with daughter  I have personally briefly reviewed patient's old medical records in [place]  Chief Complaint: confusion  HPI: Justin Shannon. is a 80 y.o. 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  Patient unable to provide any history.  He is only able to repeat back his name. I spoke with his daughter over the phone who says that currently he is not mobile due to a recent pulled groin muscle but normally calm and able to recognize self, family member and knows his birthdate.  However she has noticed for the past couple days that he has not been eating well and has had decreased liquid intake and decreased urine output.  This morning he was calm and took his morning meds but then in the afternoon he started mumbling and having garbled speech.  He was more sensitive to the touch and saying that everything hurt.  He was "more spaced out" and was getting loud, agitated and incoherent which is abnormal for him.  Patient is being worked up outpatient for concerns of focal seizure.  He was hospitalized for seizure-like activity back in December 2021 but had negative work-up with CT head, MRI brain and EEG.  Neurology thought his symptoms likely could be attributed to progression of his neurodegenerative process.  At that time patient was taking Keppra, gabapentin, Lamictal and Seroquel.  He had adjustments to his gabapentin, Lamictal and Seroquel and now has been weaned off of them.  Keppra is currently his only antiepileptic. He has planned LTM EEG on Thursday outpatient with neurology.   In the ED, patient was agitated and  yelling out. Pulling at IV lines. He was given IV Haldo and IV ativan which helped. Labs show no signs of infection.  He had hyperkalemia at 6.1 but this was hemolyzed.  He was given IV calcium gluconate.  UA and hemoglobin that showed signs of dehydration.   Review of Systems: Unable to obtain as patient only alert and oriented to self   Past Medical History:  Diagnosis Date  . Arthritis   . Bilateral swelling of feet   . BPH (benign prostatic hyperplasia)   . Chronic kidney disease   . DVT (deep venous thrombosis) (HCC)    right lower extremity  . History of gout   . Seizures ([place])   . Sleep apnea   . Tendonitis of foot    left    Past Surgical History:  Procedure Laterality Date  . APPENDECTOMY    . APPLICATION OF WOUND VAC Right 11/02/2019   Procedure: APPLICATION OF WOUND VAC;  Surgeon: Dereck Leep, MD;  Location: [facility];  Service: Orthopedics;  Laterality: Right;  [id number]  . HERNIA REPAIR     UMBILICAL  . IVC FILTER INSERTION N/A 10/25/2019   Procedure: IVC FILTER INSERTION;  Surgeon: Katha Cabal, MD;  Location: [place] [facility];  Service: Cardiovascular;  Laterality: N/A;  . KNEE ARTHROPLASTY Right 11/02/2019   Procedure: COMPUTER ASSISTED TOTAL KNEE ARTHROPLASTY;  Surgeon: Dereck Leep, MD;  Location: [facility];  Service: Orthopedics;  Laterality: Right;  . PATELLAR TENDON REPAIR Right 12/05/2019   Procedure: PATELLA TENDON REPAIR;  Surgeon: Dereck Leep, MD;  Location: [facility] OR[facility];  Service: Orthopedics;  Laterality: Right;  . PROSTATE SURGERY       reports that he quit smoking about 52 years ago. His smoking use included cigarettes. He has a 2.00 pack-year smoking history. He has never used smokeless tobacco. He reports that he does not drink alcohol and does not use drugs. Social History  Allergies  Allergen Reactions  . Shellfish Allergy Anaphylaxis and Other (See Comments)    gout  . Iodinated Diagnostic Agents Other (See Comments)    Chronic  kidney disease 3b  . Nsaids Other (See Comments)    Chronic kidney disease 3b  . Rivastigmine Other (See Comments)    Side effect and not a true allergy    Family History  Problem Relation Age of Onset  . Arthritis Mother   . Hypertension Mother   . Diabetes Brother      Prior to Admission medications   Medication Sig Start Date End Date Taking? Authorizing Provider  albuterol (VENTOLIN HFA) 108 (90 Base) MCG/ACT inhaler Inhale 2 puffs into the lungs every 4 (four) hours as needed for wheezing.    [provider]  allopurinol (ZYLOPRIM) 100 MG tablet Take 100 mg by mouth daily.    [provider]  atorvastatin (LIPITOR) 20 MG tablet Take 1 tablet (20 mg total) by mouth at bedtime. 05/21/20   Karamalegos, Devonne Doughty, DO  docusate sodium (COLACE) 100 MG capsule Take 100 mg by mouth daily as needed for mild constipation or moderate constipation.     [provider]  fexofenadine (ALLEGRA) 180 MG tablet Take 180 mg by mouth daily as needed for allergies.     [provider]  fluticasone (FLONASE) 50 MCG/ACT nasal spray Place 2 sprays into both nostrils daily as needed for allergies.  09/26/14   [provider]  fluticasone (FLOVENT HFA) 110 MCG/ACT inhaler Inhale 1 puff into the lungs in the morning and at bedtime. 07/12/20   Karamalegos, Devonne Doughty, DO  gabapentin (NEURONTIN) 100 MG capsule Take 1 capsule (100 mg total) by mouth 2 (two) times daily. Patient not taking: Reported on 06/29/2020 06/06/20   Antonieta Pert, MD  lamoTRIgine (LAMICTAL) 25 MG tablet Take 6 tablets (150 mg total) by mouth 2 (two) times daily Follow titration instructions given in office by your neurology. 06/06/20   Antonieta Pert, MD  levETIRAcetam (KEPPRA) 500 MG tablet Take 500 mg by mouth 2 (two) times daily.  03/08/19   [provider]  Magnesium 250 MG TABS Take 250 mg by mouth daily.    [provider]  melatonin 5 MG TABS Take 5 mg by mouth at bedtime as  needed (sleep). Patient not taking: Reported on 06/29/2020    [provider]  metoprolol succinate (TOPROL-XL) 50 MG 24 hr tablet Take 50 mg by mouth daily.  10/30/17   [provider]  Multiple Vitamin (ONE-A-DAY MENS PO) Take 1 tablet by mouth daily.     [provider]  XARELTO 10 MG TABS tablet Take 1 tablet (10 mg total) by mouth daily before breakfast. 07/12/20   [name], DO    Physical Exam: Vitals:   07/21/20 2000 07/21/20 2100 07/21/20 2245 07/21/20 2300  BP:  (!) 146/121 (!) 159/95 (!) 172/99  Pulse:  74 77 73  Resp:  (!) 25 18 20   Temp:      TempSrc:      SpO2:  100% 100% 100%  Weight:  127 kg     Height: 6' (1.829 m)       Constitutional: NAD, calm, comfortable Vitals:   07/21/20 2000 07/21/20 2100 07/21/20 2245 07/21/20 2300  BP:  (!) 146/121 (!) 159/95 (!) 172/99  Pulse:  74 77 73  Resp:  (!) 25 18 20   Temp:      TempSrc:      SpO2:  100% 100% 100%  Weight: 127 kg     Height: 6' (1.829 m)      Eyes: PERRL, lids and conjunctivae normal ENMT: Mucous membranes are moist. Posterior pharynx clear of any exudate or lesions.Normal dentition.  Neck: normal, supple, no masses, no thyromegaly Respiratory: clear to auscultation bilaterally, no wheezing, no crackles. Normal respiratory effort. No accessory muscle use.  Cardiovascular: Regular rate and rhythm, no murmurs / rubs / gallops. No extremity edema. 2+ pedal pulses. No carotid bruits.  Abdomen: no tenderness, no masses palpated. No hepatosplenomegaly. Bowel sounds positive.  Musculoskeletal: no clubbing / cyanosis. No joint deformity upper and lower extremities. Good ROM, no contractures. Normal muscle tone.  Skin: no rashes, lesions, ulcers. No induration Neurologic: CN 2-12 grossly intact. Sensation intact, DTR normal. Strength 5/5 in all 4.  Psychiatric: Normal judgment and insight. Alert and oriented x 3. Normal mood.   (Anything < 9 systems with 2 bullets each down  codes to level 1) (If patient refuses exam can't bill higher level) (Make sure to document decubitus ulcers present on admission -- if possible -- and whether patient has chronic indwelling catheter at time of admission)  Labs on Admission: I have personally reviewed following labs and imaging studies  CBC: Recent Labs  Lab 07/21/20 2023  WBC 6.1  NEUTROABS 3.6  HGB 10.5*  HCT 33.6*  MCV 71.2*  PLT 034   Basic Metabolic Panel: Recent Labs  Lab 07/21/20 2023 07/21/20 2107  NA 138 139  K 6.1* 4.8  CL 102 104  CO2 23 25  GLUCOSE 88 91  BUN 34* 33*  CREATININE 2.03* 2.02*  CALCIUM 10.2 10.3   GFR: Estimated Creatinine Clearance: 41.5 mL/min (A) (by C-G formula based on SCr of 2.02 mg/dL (H)). Liver Function Tests: Recent Labs  Lab 07/21/20 2023  AST 49*  ALT 30  ALKPHOS 93  BILITOT 0.9  PROT 8.0  ALBUMIN 3.8   No results for input(s): LIPASE, AMYLASE in the last 168 hours. No results for input(s): AMMONIA in the last 168 hours. Coagulation Profile: No results for input(s): INR, PROTIME in the last 168 hours. Cardiac Enzymes: No results for input(s): CKTOTAL, CKMB, CKMBINDEX, TROPONINI in the last 168 hours. BNP (last 3 results) No results for input(s): PROBNP in the last 8760 hours. HbA1C: No results for input(s): HGBA1C in the last 72 hours. CBG: Recent Labs  Lab 07/21/20 2017  GLUCAP 81   Lipid Profile: No results for input(s): CHOL, HDL, LDLCALC, TRIG, CHOLHDL, LDLDIRECT in the last 72 hours. Thyroid Function Tests: No results for input(s): TSH, T4TOTAL, FREET4, T3FREE, THYROIDAB in the last 72 hours. Anemia Panel: No results for input(s): VITAMINB12, FOLATE, FERRITIN, TIBC, IRON, RETICCTPCT in the last 72 hours. Urine analysis:    Component Value Date/Time   COLORURINE YELLOW (A) 07/21/2020 2107   APPEARANCEUR CLEAR (A) 07/21/2020 2107   APPEARANCEUR Clear 08/03/2019 1102   LABSPEC 1.018 07/21/2020 2107   LABSPEC 1.012 09/02/2012 0435    PHURINE 6.0 07/21/2020 2107   GLUCOSEU NEGATIVE 07/21/2020 2107   GLUCOSEU Negative 09/02/2012 0435   HGBUR NEGATIVE  07/21/2020 2107   BILIRUBINUR NEGATIVE 07/21/2020 2107   BILIRUBINUR Negative 08/03/2019 1102   BILIRUBINUR Negative 09/02/2012 [place] 07/21/2020 2107   PROTEINUR 100 (A) 07/21/2020 2107   NITRITE NEGATIVE 07/21/2020 2107   LEUKOCYTESUR NEGATIVE 07/21/2020 2107   LEUKOCYTESUR Negative 09/02/2012 0435    Radiological Exams on Admission: CT ABDOMEN PELVIS WO CONTRAST  Result Date: 07/21/2020 CLINICAL DATA:  Abdominal pain.  Decreased urine output. EXAM: CT ABDOMEN AND PELVIS WITHOUT CONTRAST TECHNIQUE: Multidetector CT imaging of the abdomen and pelvis was performed following the standard protocol without IV contrast. COMPARISON:  None. FINDINGS: Lower chest: The lung bases are clear.There is cardiomegaly. Hepatobiliary: The liver is normal. Normal gallbladder.There is no biliary ductal dilation. Pancreas: Normal contours without ductal dilatation. No peripancreatic fluid collection. Spleen: Unremarkable. Adrenals/Urinary Tract: --Adrenal glands: Unremarkable. --Right kidney/ureter: No hydronephrosis or radiopaque kidney stones. --Left kidney/ureter: No hydronephrosis or radiopaque kidney stones. --Urinary bladder: Unremarkable. Stomach/Bowel: --Stomach/Duodenum: No hiatal hernia or other gastric abnormality. Normal duodenal course and caliber. --Small bowel: Unremarkable. --Colon: Rectosigmoid diverticulosis without acute inflammation. --Appendix: Not visualized. No right lower quadrant inflammation or free fluid. Vascular/Lymphatic: Atherosclerotic calcification is present within the non-aneurysmal abdominal aorta, without hemodynamically significant stenosis. There is an IVC filter in place. --No retroperitoneal lymphadenopathy. --No mesenteric lymphadenopathy. --No pelvic or inguinal lymphadenopathy. Reproductive: Unremarkable Other: No ascites or free air. The  abdominal wall is normal. Musculoskeletal. No acute displaced fractures. IMPRESSION: 1. Examination is limited by motion artifact and lack of IV contrast. 2. Given this limitation, no acute abnormality was detected. 3. Cardiomegaly Aortic Atherosclerosis (ICD10-I70.0). Electronically Signed   By: Constance Holster M.D.   On: 07/21/2020 21:37   CT Head Wo Contrast  Result Date: 07/21/2020 CLINICAL DATA:  Altered mental status. EXAM: CT HEAD WITHOUT CONTRAST TECHNIQUE: Contiguous axial images were obtained from the base of the skull through the vertex without intravenous contrast. COMPARISON:  Head CT 06/04/2020, brain MRI 06/05/2020 FINDINGS: Brain: Despite repeat acquisition, there is motion artifact. No evidence of acute hemorrhage. Stable degree of atrophy and chronic small vessel ischemia. Remote left cerebellar infarct. No large subdural or extra-axial collection. No hydrocephalus. More detailed assessment is limited. Vascular: No hyperdense vessel. Skull: Grossly negative allowing for motion. Sinuses/Orbits: No acute findings. Other: Tiny right frontal scalp lipoma. IMPRESSION: 1. Motion limited exam. No evidence of acute intracranial abnormality. 2. Stable atrophy, chronic small vessel ischemia, and remote left cerebellar infarct. Electronically Signed   By: Keith Rake M.D.   On: 07/21/2020 21:35      Assessment/Plan  Acute metabolic encephalopathy Pt presented with increase agitation and confusion Patient had signs of hypovolemia with hyaline cast seen on UA and hemoconcentrated hemoglobin.  He has been having decreased p.o. intake. Also could be worsening of presumed Lewy body dementia He has received 1 L of IV normal saline bolus. Continue continuous IV NS fluids at 75c/hr Will have one-to-one sitter to redirect  Hyperkalemia Had potassium was 6.1 but this was hemolyzed He was given IV calcium gluconate Repeat BMP to follow potassium  History of seizures Convert Keppra to IV  since patient is unable to follow commands to take anything orally at this time He is currently being worked up by neurology outpatient for suspected breakthrough seizures.  He has LTM EEG set up outpatient Thursday  History of recurrent DVT s/p IVC filter Continue Xarelto unable to tolerate p.o.  CKD stage IV Creatinine stable.  Avoid nephrotoxic agent.  Type 2 diabetes Controlled.  Monitor  with morning CMP  Hypertension Continue home regimen of metoprolol when able to tolerate p.o.  Hyperlipidemia Continue home regimen of atorvastatin when able to tolerate p.o.  DVT prophylaxis: Xarelto  code Status: Limited code-no intubation Family Communication: Plan discussed with daughter over the phone.  She would like to have updates daily disposition Plan: Home with observation Consults called:  Admission status: Observation  Level of care: Med-Surg  Status is: Observation  The patient remains OBS appropriate and will d/c before 2 midnights.  Dispo: The patient is from: Home              Anticipated d/c is to: Home              Anticipated d/c date is: 1 day              Patient currently is not medically stable to d/c.   Difficult to place patient no         Orene Desanctis DO [facility]   If 7PM-7AM, please contact night-coverage [email or web address]   07/21/2020, 11:34 PM

## 2020-07-21 NOTE — ED Notes (Signed)
Patient continues to be agitated with loud verbalizations

## 2020-07-21 NOTE — ED Provider Notes (Signed)
[facility] Emergency Department Provider Note  ____________________________________________   Event Date/Time   First MD Initiated Contact with Patient 07/21/20 2006     (approximate)  I have reviewed the triage vital signs and the nursing notes.   HISTORY  Chief Complaint Altered Mental Status    HPI [facility] [name] is a 80 y.o. male  Here with reported AMS. Pt demented, unable to provide history.     Discussed with daughter [name]. Per report, he has had some gradual decline for weeks to months, is scheduled for an EEG 2/2 concern for partial seizures this week. Today, pt had two episodes where he fairly acutely became less responsive, followed by slurred speech and marked increase in his confusion. He has also had poor PO intake for the past several days. No recent med changes.  Level 5 caveat invoked as remainder of history, ROS, and physical exam limited due to patient's ams/dementia.   Past Medical History:  Diagnosis Date  . Arthritis   . Bilateral swelling of feet   . BPH (benign prostatic hyperplasia)   . Chronic kidney disease   . DVT (deep venous thrombosis) (HCC)    right lower extremity  . History of gout   . Seizures ([place])   . Sleep apnea   . Tendonitis of foot    left    Patient Active Problem List   Diagnosis Date Noted  . Acute metabolic encephalopathy 61/95/0932  . COPD (chronic obstructive pulmonary disease) ([place]) 07/20/2020  . Slurred speech 06/04/2020  . CKD (chronic kidney disease) stage 3, GFR 30-59 ml/min (HCC) 06/04/2020  . Essential hypertension 06/04/2020  . Hyperlipidemia 06/04/2020  . Lewy body dementia without behavioral disturbance ([place]) 05/21/2020  . Hyperlipidemia associated with type 2 diabetes mellitus ([place]) 05/21/2020  . Status post total right knee replacement 01/22/2020  . Anemia of chronic kidney failure, stage 3 (moderate) ([place]) 01/16/2020  . Total knee replacement status 11/02/2019  . History  of total knee arthroplasty 11/02/2019  . PAD (peripheral artery disease) ([place]) 10/13/2019  . Benign prostatic hyperplasia   . Seizures ([place])   . Anemia due to stage 4 chronic kidney disease ([place]) 06/24/2019  . Generalized weakness 06/24/2019  . Anemia in chronic kidney disease ([place]) 06/24/2019  . Asthenia 06/24/2019  . Primary osteoarthritis of right knee 04/03/2019  . Ankle ulcer ([place]) 05/08/2018  . Use of cane as ambulatory aid 04/27/2018  . Dependence on other enabling machines and devices 04/27/2018  . Type 2 diabetes mellitus with stage 4 chronic kidney disease, without long-term current use of insulin ([place]) 06/11/2017  . Lymphedema 05/04/2016  . Chronic venous insufficiency 05/04/2016  . Toe sprain 01/24/2016  . Morbid obesity with body mass index of 40.0-44.9 in adult [facility]) 05/24/2014  . Body mass index (BMI)40.0-44.9, adult 05/24/2014  . OSA on CPAP 01/10/2014  . CKD (chronic kidney disease), stage IV ([place]) 11/22/2013  . Allergic rhinitis 11/06/2013  . Benign hypertension with CKD (chronic kidney disease) stage IV ([place]) 11/06/2013  . Epilepsy ([place]) 11/06/2013  . Gout 11/06/2013  . History of DVT (deep vein thrombosis) 11/06/2013  . Chronic deep vein thrombosis (DVT) of proximal vein of lower extremity ([place]) 11/06/2013  . 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 Personal history of other venous thrombosis and embolism 11/06/2013    Past Surgical History:  Procedure Laterality Date  . APPENDECTOMY    . APPLICATION OF WOUND VAC Right  11/02/2019   Procedure: APPLICATION OF WOUND VAC;  Surgeon: Dereck Leep, MD;  Location: [facility] ORS;  Service: Orthopedics;  Laterality: Right;  [id number]  . HERNIA REPAIR     UMBILICAL  . IVC FILTER INSERTION N/A 10/25/2019   Procedure: IVC FILTER INSERTION;  Surgeon: Katha Cabal, MD;  Location: [facility];  Service: Cardiovascular;  Laterality: N/A;   . KNEE ARTHROPLASTY Right 11/02/2019   Procedure: COMPUTER ASSISTED TOTAL KNEE ARTHROPLASTY;  Surgeon: Dereck Leep, MD;  Location: [facility] ORS;  Service: Orthopedics;  Laterality: Right;  . PATELLAR TENDON REPAIR Right 12/05/2019   Procedure: PATELLA TENDON REPAIR;  Surgeon: Dereck Leep, MD;  Location: [facility] ORS;  Service: Orthopedics;  Laterality: Right;  . PROSTATE SURGERY      Prior to Admission medications   Medication Sig Start Date End Date Taking? Authorizing Provider  albuterol (VENTOLIN HFA) 108 (90 Base) MCG/ACT inhaler Inhale 2 puffs into the lungs every 4 (four) hours as needed for wheezing.    [provider]  allopurinol (ZYLOPRIM) 100 MG tablet Take 100 mg by mouth daily.    [provider]  atorvastatin (LIPITOR) 20 MG tablet Take 1 tablet (20 mg total) by mouth at bedtime. 05/21/20   Karamalegos, Devonne Doughty, DO  docusate sodium (COLACE) 100 MG capsule Take 100 mg by mouth daily as needed for mild constipation or moderate constipation.     [provider]  fexofenadine (ALLEGRA) 180 MG tablet Take 180 mg by mouth daily as needed for allergies.     [provider]  fluticasone (FLONASE) 50 MCG/ACT nasal spray Place 2 sprays into both nostrils daily as needed for allergies.  09/26/14   [provider]  fluticasone (FLOVENT HFA) 110 MCG/ACT inhaler Inhale 1 puff into the lungs in the morning and at bedtime. 07/12/20   Karamalegos, Devonne Doughty, DO  gabapentin (NEURONTIN) 100 MG capsule Take 1 capsule (100 mg total) by mouth 2 (two) times daily. Patient not taking: Reported on 06/29/2020 06/06/20   Antonieta Pert, MD  lamoTRIgine (LAMICTAL) 25 MG tablet Take 6 tablets (150 mg total) by mouth 2 (two) times daily Follow titration instructions given in office by your neurology. 06/06/20   Antonieta Pert, MD  levETIRAcetam (KEPPRA) 500 MG tablet Take 500 mg by mouth 2 (two) times daily.  03/08/19   [provider]  Magnesium 250 MG TABS Take 250  mg by mouth daily.    [provider]  melatonin 5 MG TABS Take 5 mg by mouth at bedtime as needed (sleep). Patient not taking: Reported on 06/29/2020    [provider]  metoprolol succinate (TOPROL-XL) 50 MG 24 hr tablet Take 50 mg by mouth daily.  10/30/17   [provider]  Multiple Vitamin (ONE-A-DAY MENS PO) Take 1 tablet by mouth daily.     [provider]  XARELTO 10 MG TABS tablet Take 1 tablet (10 mg total) by mouth daily before breakfast. 07/12/20   [name], DO    Allergies Shellfish allergy, Iodinated diagnostic agents, Nsaids, and Rivastigmine  Family History  Problem Relation Age of Onset  . Arthritis Mother   . Hypertension Mother   . Diabetes Brother     Social History Social History   Tobacco Use  . Smoking status: Former Smoker    Packs/day: 1.00    Years: 2.00    Pack years: 2.00    Types: Cigarettes    Quit date: 06/23/1968  Years since quitting: 52.1  . Smokeless tobacco: Never Used  . Tobacco comment: 25 years ago quit  Vaping Use  . Vaping Use: Never used  Substance Use Topics  . Alcohol use: No  . Drug use: No    Review of Systems  Review of Systems  Unable to perform ROS: Mental status change     ____________________________________________  PHYSICAL EXAM:      VITAL SIGNS: ED Triage Vitals  Enc Vitals Group     BP 07/21/20 1959 (!) 149/66     Pulse Rate 07/21/20 1959 69     Resp 07/21/20 1959 18     Temp 07/21/20 1959 97.7 F (36.5 C)     Temp Source 07/21/20 1959 Oral     SpO2 07/21/20 1959 98 %     Weight 07/21/20 2000 280 lb (127 kg)     Height 07/21/20 2000 6' (1.829 m)     Head Circumference --      Peak Flow --      Pain Score --      Pain Loc --      Pain Edu? --      Excl. in [place]? --      Physical Exam Vitals and nursing note reviewed.  Constitutional:      General: He is not in acute distress.    Appearance: He is well-developed and well-nourished.  HENT:      Head: Normocephalic and atraumatic.     Mouth/Throat:     Mouth: Mucous membranes are dry.  Eyes:     Conjunctiva/sclera: Conjunctivae normal.  Cardiovascular:     Rate and Rhythm: Normal rate and regular rhythm.     Heart sounds: Normal heart sounds.  Pulmonary:     Effort: Pulmonary effort is normal. No respiratory distress.     Breath sounds: No wheezing.  Abdominal:     General: There is no distension.  Musculoskeletal:        General: No edema.     Cervical back: Neck supple.  Skin:    General: Skin is warm.     Capillary Refill: Capillary refill takes less than 2 seconds.     Findings: No rash.  Neurological:     Mental Status: He is alert.     Motor: No abnormal muscle tone.     Comments: Oriented to person only.  Will follow commands but is intermittently agitated.  Frequent repetition of words       ____________________________________________   LABS (all labs ordered are listed, but only abnormal results are displayed)  Labs Reviewed  COMPREHENSIVE METABOLIC PANEL - Abnormal; Notable for the following components:      Result Value   Potassium 6.1 (*)    BUN 34 (*)    Creatinine, Ser 2.03 (*)    AST 49 (*)    GFR, Estimated 33 (*)    All other components within normal limits  URINALYSIS, COMPLETE (UACMP) WITH MICROSCOPIC - Abnormal; Notable for the following components:   Color, Urine YELLOW (*)    APPearance CLEAR (*)    Protein, ur 100 (*)    Bacteria, UA RARE (*)    All other components within normal limits  CBC WITH DIFFERENTIAL/PLATELET - Abnormal; Notable for the following components:   Hemoglobin 10.5 (*)    HCT 33.6 (*)    MCV 71.2 (*)    MCH 22.2 (*)    RDW 18.6 (*)    All other components within normal limits  BASIC METABOLIC PANEL - Abnormal; Notable for the following components:   BUN 33 (*)    Creatinine, Ser 2.02 (*)    GFR, Estimated 33 (*)    All other components within normal limits  URINE CULTURE  SARS CORONAVIRUS 2 (TAT 6-24  HRS)  LACTIC ACID, PLASMA  LACTIC ACID, PLASMA  LAMOTRIGINE LEVEL  LEVETIRACETAM LEVEL  BASIC METABOLIC PANEL  CBC  CBG MONITORING, ED  CBG MONITORING, ED    ____________________________________________  EKG:  ________________________________________  RADIOLOGY All imaging, including plain films, CT scans, and ultrasounds, independently reviewed by me, and interpretations confirmed via formal radiology reads.  ED MD interpretation:   CT A/P: Unremarkable, no abnormality CT Head: [place]  Official radiology report(s): CT ABDOMEN PELVIS WO CONTRAST  Result Date: 07/21/2020 CLINICAL DATA:  Abdominal pain.  Decreased urine output. EXAM: CT ABDOMEN AND PELVIS WITHOUT CONTRAST TECHNIQUE: Multidetector CT imaging of the abdomen and pelvis was performed following the standard protocol without IV contrast. COMPARISON:  None. FINDINGS: Lower chest: The lung bases are clear.There is cardiomegaly. Hepatobiliary: The liver is normal. Normal gallbladder.There is no biliary ductal dilation. Pancreas: Normal contours without ductal dilatation. No peripancreatic fluid collection. Spleen: Unremarkable. Adrenals/Urinary Tract: --Adrenal glands: Unremarkable. --Right kidney/ureter: No hydronephrosis or radiopaque kidney stones. --Left kidney/ureter: No hydronephrosis or radiopaque kidney stones. --Urinary bladder: Unremarkable. Stomach/Bowel: --Stomach/Duodenum: No hiatal hernia or other gastric abnormality. Normal duodenal course and caliber. --Small bowel: Unremarkable. --Colon: Rectosigmoid diverticulosis without acute inflammation. --Appendix: Not visualized. No right lower quadrant inflammation or free fluid. Vascular/Lymphatic: Atherosclerotic calcification is present within the non-aneurysmal abdominal aorta, without hemodynamically significant stenosis. There is an IVC filter in place. --No retroperitoneal lymphadenopathy. --No mesenteric lymphadenopathy. --No pelvic or inguinal lymphadenopathy.  Reproductive: Unremarkable Other: No ascites or free air. The abdominal wall is normal. Musculoskeletal. No acute displaced fractures. IMPRESSION: 1. Examination is limited by motion artifact and lack of IV contrast. 2. Given this limitation, no acute abnormality was detected. 3. Cardiomegaly Aortic Atherosclerosis (ICD10-I70.0). Electronically Signed   By: Constance Holster M.D.   On: 07/21/2020 21:37   CT Head Wo Contrast  Result Date: 07/21/2020 CLINICAL DATA:  Altered mental status. EXAM: CT HEAD WITHOUT CONTRAST TECHNIQUE: Contiguous axial images were obtained from the base of the skull through the vertex without intravenous contrast. COMPARISON:  Head CT 06/04/2020, brain MRI 06/05/2020 FINDINGS: Brain: Despite repeat acquisition, there is motion artifact. No evidence of acute hemorrhage. Stable degree of atrophy and chronic small vessel ischemia. Remote left cerebellar infarct. No large subdural or extra-axial collection. No hydrocephalus. More detailed assessment is limited. Vascular: No hyperdense vessel. Skull: Grossly negative allowing for motion. Sinuses/Orbits: No acute findings. Other: Tiny right frontal scalp lipoma. IMPRESSION: 1. Motion limited exam. No evidence of acute intracranial abnormality. 2. Stable atrophy, chronic small vessel ischemia, and remote left cerebellar infarct. Electronically Signed   By: Keith Rake M.D.   On: 07/21/2020 21:35    ____________________________________________  PROCEDURES   Procedure(s) performed (including Critical Care):  .1-3 Lead EKG Interpretation Performed by: Duffy Bruce, MD Authorized by: Duffy Bruce, MD     Interpretation: normal     ECG rate:  60-80   ECG rate assessment: normal     Rhythm: sinus rhythm     Ectopy: none     Conduction: normal   Comments:     Indication: AMS    ____________________________________________  INITIAL IMPRESSION / MDM / ASSESSMENT AND PLAN / ED COURSE  As part of my medical  decision making,  I reviewed the following data within the [place] notes reviewed and incorporated, Old chart reviewed, Notes from prior ED visits, and [place] Controlled Substance [place]. was evaluated in Emergency Department on 07/21/2020 for the symptoms described in the history of present illness. He was evaluated in the context of the global COVID-19 pandemic, which necessitated consideration that the patient might be at risk for infection with the SARS-CoV-2 virus that causes COVID-19. Institutional protocols and algorithms that pertain to the evaluation of patients at risk for COVID-19 are in a state of rapid change based on information released by regulatory bodies including the CDC and federal and state organizations. These policies and algorithms were followed during the patient's care in the ED.  Some ED evaluations and interventions may be delayed as a result of limited staffing during the pandemic.*     Medical Decision Making: 80 year old male here with altered mental status.  Differential includes encephalopathy secondary to possible atypical UTI, also partial seizures.  The patient is a well-documented seizure history and had a scheduled EEG as an outpatient.  Per report, his episode was transient followed by possible decreased responsiveness.  Will plan to admit for monitoring and possible neurology evaluation.  Otherwise, lab work reviewed.  CT head shows no acute abnormality.  He has no apparent strokelike deficits.  CT of the abdomen and pelvis obtained secondary to his tenderness on exam, and is unremarkable. Discussed w/ family who is in agreement with plan.  ____________________________________________  FINAL CLINICAL IMPRESSION(S) / ED DIAGNOSES  Final diagnoses:  Transient alteration of awareness  Encephalopathy  Dehydration     MEDICATIONS GIVEN DURING THIS VISIT:  Medications  calcium gluconate 1 g/ 50 mL sodium  chloride IVPB (1,000 mg Intravenous New Bag/Given 07/21/20 2237)  haloperidol lactate (HALDOL) injection 2 mg (2 mg Intravenous Given 07/21/20 2148)  sodium chloride 0.9 % bolus 1,000 mL (0 mLs Intravenous Stopped 07/21/20 2328)  levETIRAcetam (KEPPRA) IVPB 1000 mg/100 mL premix (0 mg Intravenous Stopped 07/21/20 2235)  LORazepam (ATIVAN) injection 0.5 mg (0.5 mg Intravenous Given 07/21/20 2235)     ED Discharge Orders    None       Note:  This document was prepared using Dragon voice recognition software and may include unintentional dictation errors.   Duffy Bruce, MD 07/21/20 [phone]

## 2020-07-21 NOTE — ED Notes (Signed)
Pt pulling at cords. Continues to pull off pulse oximetry and EKG monitor cords.

## 2020-07-21 NOTE — ED Triage Notes (Signed)
First Nurse: patient brought in by ems from home. Patient with Altered Mental Status that started today. Patient with decrease po intake over the last two day. Wife reported that patient has had decreased urine output and urine darker in color. Hr by ems 66 and CBG by [employer] 89. Ems unable to obtain blood pressure due to agitation.

## 2020-07-21 NOTE — ED Notes (Signed)
Pat in and out cathed for urine specimen per md order

## 2020-07-21 NOTE — ED Notes (Signed)
Pt agitated screaming and requesting to leave.  Pt pulled out IV in left wrist.

## 2020-07-21 NOTE — ED Notes (Signed)
Failed for swallow study pt not following commands. Pt with altered mental status.

## 2020-07-21 NOTE — ED Triage Notes (Signed)
Patient presents to Emergency Department via [facility] from home with complaints of:[facility] EMS wife called because pt was confused and normally "A and O times four"  Pt is calling out repeatedly to people, names not recognized, unable to answer any question but name and DOB, reports weight as 70 pounds  Pt c/o of abdominal pain and reports pain mid left and right to moderate pressure

## 2020-07-21 NOTE — ED Notes (Signed)
ED Provider at bedside speaking to daughter

## 2020-07-21 NOTE — H&P (Addendum)
History and Physical    [facility]. ZOX:[id number] DOB: June 18, 1941 DOA: 07/21/2020  PCP: [name], DO  Patient coming from: Home, lives with daughter  I have personally briefly reviewed patient's old medical records in [place]  Chief Complaint: confusion  HPI: Justin Shannon. is a 80 y.o. 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  Patient unable to provide any history.  He is only able to repeat back his name. I spoke with his daughter over the phone who says that currently he is not mobile due to a recent pulled groin muscle but normally calm and able to recognize self, family member and knows his birthdate.  However she has noticed for the past couple days that he has not been eating well and has had decreased liquid intake and decreased urine output.  This morning he was calm and took his morning meds but then in the afternoon he started mumbling and having garbled speech.  He was more sensitive to the touch and saying that everything hurt.  He was "more spaced out" and was getting loud, agitated and incoherent which is abnormal for him.  Patient is being worked up outpatient for concerns of focal seizure.  He was hospitalized for seizure-like activity back in December 2021 but had negative work-up with CT head, MRI brain and EEG.  Neurology thought his symptoms likely could be attributed to progression of his neurodegenerative process.  At that time patient was taking Keppra, gabapentin, Lamictal and Seroquel.  He had adjustments to his gabapentin, Lamictal and Seroquel and now has been weaned off of them.  Keppra is currently his only antiepileptic. He has planned LTM EEG on Thursday outpatient with neurology.   In the ED, patient was agitated and  yelling out. Pulling at IV lines. He was given IV Haldo and IV ativan which helped. Labs show no signs of infection.  He had hyperkalemia at 6.1 but this was hemolyzed.  He was given IV calcium gluconate.  UA and hemoglobin that showed signs of dehydration.   Review of Systems: Unable to obtain as patient only alert and oriented to self   Past Medical History:  Diagnosis Date  . Arthritis   . Bilateral swelling of feet   . BPH (benign prostatic hyperplasia)   . Chronic kidney disease   . DVT (deep venous thrombosis) (HCC)    right lower extremity  . History of gout   . Seizures ([place])   . Sleep apnea   . Tendonitis of foot    left    Past Surgical History:  Procedure Laterality Date  . APPENDECTOMY    . APPLICATION OF WOUND VAC Right 11/02/2019   Procedure: APPLICATION OF WOUND VAC;  Surgeon: Dereck Leep, MD;  Location: [facility] ORS;  Service: Orthopedics;  Laterality: Right;  [id number]  . HERNIA REPAIR     UMBILICAL  . IVC FILTER INSERTION N/A 10/25/2019   Procedure: IVC FILTER INSERTION;  Surgeon: Katha Cabal, MD;  Location: [facility];  Service: Cardiovascular;  Laterality: N/A;  . KNEE ARTHROPLASTY Right 11/02/2019   Procedure: COMPUTER ASSISTED TOTAL KNEE ARTHROPLASTY;  Surgeon: Dereck Leep, MD;  Location: [facility] OR[facility];  Service: Orthopedics;  Laterality: Right;  . PATELLAR TENDON REPAIR Right 12/05/2019   Procedure: PATELLA TENDON REPAIR;  Surgeon: Dereck Leep, MD;  Location: [facility] ORS;  Service: Orthopedics;  Laterality: Right;  . PROSTATE SURGERY       reports that he quit smoking about 52 years ago. His smoking use included cigarettes. He has a 2.00 pack-year smoking history. He has never used smokeless tobacco. He reports that he does not drink alcohol and does not use drugs. Social History  Allergies  Allergen Reactions  . Shellfish Allergy Anaphylaxis and Other (See Comments)    gout  . Iodinated Diagnostic Agents Other (See Comments)    Chronic  kidney disease 3b  . Nsaids Other (See Comments)    Chronic kidney disease 3b  . Rivastigmine Other (See Comments)    Side effect and not a true allergy    Family History  Problem Relation Age of Onset  . Arthritis Mother   . Hypertension Mother   . Diabetes Brother      Prior to Admission medications   Medication Sig Start Date End Date Taking? Authorizing Provider  albuterol (VENTOLIN HFA) 108 (90 Base) MCG/ACT inhaler Inhale 2 puffs into the lungs every 4 (four) hours as needed for wheezing.    [provider]  allopurinol (ZYLOPRIM) 100 MG tablet Take 100 mg by mouth daily.    [provider]  atorvastatin (LIPITOR) 20 MG tablet Take 1 tablet (20 mg total) by mouth at bedtime. 05/21/20   Karamalegos, Devonne Doughty, DO  docusate sodium (COLACE) 100 MG capsule Take 100 mg by mouth daily as needed for mild constipation or moderate constipation.     [provider]  fexofenadine (ALLEGRA) 180 MG tablet Take 180 mg by mouth daily as needed for allergies.     [provider]  fluticasone (FLONASE) 50 MCG/ACT nasal spray Place 2 sprays into both nostrils daily as needed for allergies.  09/26/14   [provider]  fluticasone (FLOVENT HFA) 110 MCG/ACT inhaler Inhale 1 puff into the lungs in the morning and at bedtime. 07/12/20   Karamalegos, Devonne Doughty, DO  gabapentin (NEURONTIN) 100 MG capsule Take 1 capsule (100 mg total) by mouth 2 (two) times daily. Patient not taking: Reported on 06/29/2020 06/06/20   Antonieta Pert, MD  lamoTRIgine (LAMICTAL) 25 MG tablet Take 6 tablets (150 mg total) by mouth 2 (two) times daily Follow titration instructions given in office by your neurology. 06/06/20   Antonieta Pert, MD  levETIRAcetam (KEPPRA) 500 MG tablet Take 500 mg by mouth 2 (two) times daily.  03/08/19   [provider]  Magnesium 250 MG TABS Take 250 mg by mouth daily.    [provider]  melatonin 5 MG TABS Take 5 mg by mouth at bedtime as  needed (sleep). Patient not taking: Reported on 06/29/2020    [provider]  metoprolol succinate (TOPROL-XL) 50 MG 24 hr tablet Take 50 mg by mouth daily.  10/30/17   [provider]  Multiple Vitamin (ONE-A-DAY MENS PO) Take 1 tablet by mouth daily.     [provider]  XARELTO 10 MG TABS tablet Take 1 tablet (10 mg total) by mouth daily before breakfast. 07/12/20   [name], DO    Physical Exam: Vitals:   07/21/20 2000 07/21/20 2100 07/21/20 2245 07/21/20 2300  BP:  (!) 146/121 (!) 159/95 (!) 172/99  Pulse:  74 77 73  Resp:  (!) 25 18 20   Temp:      TempSrc:      SpO2:  100% 100% 100%  Weight:  127 kg     Height: 6' (1.829 m)       Constitutional: NAD, calm, comfortable, elderly gentleman lying flat in bed Vitals:   07/21/20 2000 07/21/20 2100 07/21/20 2245 07/21/20 2300  BP:  (!) 146/121 (!) 159/95 (!) 172/99  Pulse:  74 77 73  Resp:  (!) 25 18 20   Temp:      TempSrc:      SpO2:  100% 100% 100%  Weight: 127 kg     Height: 6' (1.829 m)      Eyes: PERRL, lids and conjunctivae normal ENMT: Mucous membranes are moist.  Neck: normal, supple,  Respiratory: clear to auscultation bilaterally, no wheezing, no crackles. Normal respiratory effort. No accessory muscle use.  Cardiovascular: Regular rate and rhythm, no murmurs / rubs / gallops. No extremity edema.   Abdomen: no tenderness, no masses palpated.  Bowel sounds positive.  GU: external urinary catheter in place Musculoskeletal: no clubbing / cyanosis. No joint deformity upper and lower extremities.  Normal muscle tone.  Skin: no rashes, lesions, ulcers. No induration Neurologic: Patient alert and oriented only to self.  Only saying his name to most questions.  He was able to follow commands with lifting all 4 extremities but otherwise unable to follow other commands to complete a full neurological exam psychiatric: Patient was calm in bed following doses of IV Ativan and IV  Haldol    Labs on Admission: I have personally reviewed following labs and imaging studies  CBC: Recent Labs  Lab 07/21/20 2023  WBC 6.1  NEUTROABS 3.6  HGB 10.5*  HCT 33.6*  MCV 71.2*  PLT 732   Basic Metabolic Panel: Recent Labs  Lab 07/21/20 2023 07/21/20 2107  NA 138 139  K 6.1* 4.8  CL 102 104  CO2 23 25  GLUCOSE 88 91  BUN 34* 33*  CREATININE 2.03* 2.02*  CALCIUM 10.2 10.3   GFR: Estimated Creatinine Clearance: 41.5 mL/min (A) (by C-G formula based on SCr of 2.02 mg/dL (H)). Liver Function Tests: Recent Labs  Lab 07/21/20 2023  AST 49*  ALT 30  ALKPHOS 93  BILITOT 0.9  PROT 8.0  ALBUMIN 3.8   No results for input(s): LIPASE, AMYLASE in the last 168 hours. No results for input(s): AMMONIA in the last 168 hours. Coagulation Profile: No results for input(s): INR, PROTIME in the last 168 hours. Cardiac Enzymes: No results for input(s): CKTOTAL, CKMB, CKMBINDEX, TROPONINI in the last 168 hours. BNP (last 3 results) No results for input(s): PROBNP in the last 8760 hours. HbA1C: No results for input(s): HGBA1C in the last 72 hours. CBG: Recent Labs  Lab 07/21/20 2017  GLUCAP 81   Lipid Profile: No results for input(s): CHOL, HDL, LDLCALC, TRIG, CHOLHDL, LDLDIRECT in the last 72 hours. Thyroid Function Tests: No results for input(s): TSH, T4TOTAL, FREET4, T3FREE, THYROIDAB in the last 72 hours. Anemia Panel: No results for input(s): VITAMINB12, FOLATE, FERRITIN, TIBC, IRON, RETICCTPCT in the last 72 hours. Urine analysis:    Component Value Date/Time   COLORURINE YELLOW (A) 07/21/2020 2107   APPEARANCEUR CLEAR (A) 07/21/2020 2107   APPEARANCEUR Clear 08/03/2019 1102   LABSPEC 1.018 07/21/2020 2107   LABSPEC 1.012 09/02/2012 0435   PHURINE 6.0 07/21/2020 2107   GLUCOSEU NEGATIVE 07/21/2020 2107   GLUCOSEU Negative 09/02/2012 0435   HGBUR NEGATIVE 07/21/2020 2107   BILIRUBINUR NEGATIVE 07/21/2020 2107   BILIRUBINUR Negative 08/03/2019 [place] Negative 09/02/2012 [place] 07/21/2020 2107  PROTEINUR 100 (A) 07/21/2020 2107   NITRITE NEGATIVE 07/21/2020 2107   LEUKOCYTESUR NEGATIVE 07/21/2020 2107   LEUKOCYTESUR Negative 09/02/2012 0435    Radiological Exams on Admission: CT ABDOMEN PELVIS WO CONTRAST  Result Date: 07/21/2020 CLINICAL DATA:  Abdominal pain.  Decreased urine output. EXAM: CT ABDOMEN AND PELVIS WITHOUT CONTRAST TECHNIQUE: Multidetector CT imaging of the abdomen and pelvis was performed following the standard protocol without IV contrast. COMPARISON:  None. FINDINGS: Lower chest: The lung bases are clear.There is cardiomegaly. Hepatobiliary: The liver is normal. Normal gallbladder.There is no biliary ductal dilation. Pancreas: Normal contours without ductal dilatation. No peripancreatic fluid collection. Spleen: Unremarkable. Adrenals/Urinary Tract: --Adrenal glands: Unremarkable. --Right kidney/ureter: No hydronephrosis or radiopaque kidney stones. --Left kidney/ureter: No hydronephrosis or radiopaque kidney stones. --Urinary bladder: Unremarkable. Stomach/Bowel: --Stomach/Duodenum: No hiatal hernia or other gastric abnormality. Normal duodenal course and caliber. --Small bowel: Unremarkable. --Colon: Rectosigmoid diverticulosis without acute inflammation. --Appendix: Not visualized. No right lower quadrant inflammation or free fluid. Vascular/Lymphatic: Atherosclerotic calcification is present within the non-aneurysmal abdominal aorta, without hemodynamically significant stenosis. There is an IVC filter in place. --No retroperitoneal lymphadenopathy. --No mesenteric lymphadenopathy. --No pelvic or inguinal lymphadenopathy. Reproductive: Unremarkable Other: No ascites or free air. The abdominal wall is normal. Musculoskeletal. No acute displaced fractures. IMPRESSION: 1. Examination is limited by motion artifact and lack of IV contrast. 2. Given this limitation, no acute abnormality was detected. 3.  Cardiomegaly Aortic Atherosclerosis (ICD10-I70.0). Electronically Signed   By: Constance Holster M.D.   On: 07/21/2020 21:37   CT Head Wo Contrast  Result Date: 07/21/2020 CLINICAL DATA:  Altered mental status. EXAM: CT HEAD WITHOUT CONTRAST TECHNIQUE: Contiguous axial images were obtained from the base of the skull through the vertex without intravenous contrast. COMPARISON:  Head CT 06/04/2020, brain MRI 06/05/2020 FINDINGS: Brain: Despite repeat acquisition, there is motion artifact. No evidence of acute hemorrhage. Stable degree of atrophy and chronic small vessel ischemia. Remote left cerebellar infarct. No large subdural or extra-axial collection. No hydrocephalus. More detailed assessment is limited. Vascular: No hyperdense vessel. Skull: Grossly negative allowing for motion. Sinuses/Orbits: No acute findings. Other: Tiny right frontal scalp lipoma. IMPRESSION: 1. Motion limited exam. No evidence of acute intracranial abnormality. 2. Stable atrophy, chronic small vessel ischemia, and remote left cerebellar infarct. Electronically Signed   By: Keith Rake M.D.   On: 07/21/2020 21:35      Assessment/Plan  Acute metabolic encephalopathy Pt presented with increase agitation and confusion CT head negative. Likely from dehydration. Patient had signs of hypovolemia with hyaline cast seen on UA and hemoconcentrated hemoglobin.  He has been having decreased p.o. intake. This likely is worsening his presumed Lewy body dementia He has received 1 L of IV normal saline bolus. Continue continuous IV NS fluids at 75c/hr Will have one-to-one sitter to redirect  Hyperkalemia Had potassium was 6.1 but this was hemolyzed He was given IV calcium gluconate Repeat BMP to follow potassium  History of seizures Convert Keppra to IV since patient is unable to follow commands to take anything orally at this time He is currently being worked up by neurology outpatient for suspected breakthrough seizures.  Although no witnessed seizures here.  He has LTM EEG set up outpatient Thursday  History of recurrent DVT s/p IVC filter Continue Xarelto unable to tolerate p.o.  CKD stage IV Creatinine stable.  Avoid nephrotoxic agent.  Type 2 diabetes Controlled.  Monitor with morning CMP  Hypertension Continue home regimen of metoprolol when able to tolerate p.o.  Hyperlipidemia  Continue home regimen of atorvastatin when able to tolerate p.o.  DVT prophylaxis: Xarelto  code Status: Limited code-no intubation Family Communication: Plan discussed with daughter over the phone.  She would like to have updates daily disposition Plan: Home with observation Consults called:  Admission status: Observation  Level of care: Med-Surg  Status is: Observation  The patient remains OBS appropriate and will d/c before 2 midnights.  Dispo: The patient is from: Home              Anticipated d/c is to: Home              Anticipated d/c date is: 1 day              Patient currently is not medically stable to d/c.   Difficult to place patient no         Orene Desanctis DO [facility]   If 7PM-7AM, please contact night-coverage [email or web address]   07/21/2020, 11:34 PM

## 2020-07-22 DIAGNOSIS — Z7401 Bed confinement status: Secondary | ICD-10-CM | POA: Diagnosis not present

## 2020-07-22 DIAGNOSIS — R404 Transient alteration of awareness: Secondary | ICD-10-CM | POA: Diagnosis not present

## 2020-07-22 DIAGNOSIS — Z743 Need for continuous supervision: Secondary | ICD-10-CM | POA: Diagnosis not present

## 2020-07-22 DIAGNOSIS — G9341 Metabolic encephalopathy: Secondary | ICD-10-CM

## 2020-07-22 DIAGNOSIS — R6889 Other general symptoms and signs: Secondary | ICD-10-CM | POA: Diagnosis not present

## 2020-07-22 DIAGNOSIS — M2550 Pain in unspecified joint: Secondary | ICD-10-CM | POA: Diagnosis not present

## 2020-07-22 LAB — BASIC METABOLIC PANEL
Anion gap: 9 (ref 5–15)
BUN: 30 mg/dL — ABNORMAL HIGH (ref 8–23)
CO2: 23 mmol/L (ref 22–32)
Calcium: 10.1 mg/dL (ref 8.9–10.3)
Chloride: 107 mmol/L (ref 98–111)
Creatinine, Ser: 1.8 mg/dL — ABNORMAL HIGH (ref 0.61–1.24)
GFR, Estimated: 38 mL/min — ABNORMAL LOW (ref >60)
Glucose, Bld: 84 mg/dL (ref 70–99)
Potassium: 5.2 mmol/L — ABNORMAL HIGH (ref 3.5–5.1)
Sodium: 139 mmol/L (ref 135–145)

## 2020-07-22 LAB — CBC
HCT: 30.9 % — ABNORMAL LOW (ref 39.0–52.0)
Hemoglobin: 10.1 g/dL — ABNORMAL LOW (ref 13.0–17.0)
MCH: 23 pg — ABNORMAL LOW (ref 26.0–34.0)
MCHC: 32.7 g/dL (ref 30.0–36.0)
MCV: 70.2 fL — ABNORMAL LOW (ref 80.0–100.0)
Platelets: 186 10*3/uL (ref 150–400)
RBC: 4.4 MIL/uL (ref 4.22–5.81)
RDW: 18.7 % — ABNORMAL HIGH (ref 11.5–15.5)
WBC: 7.1 10*3/uL (ref 4.0–10.5)
nRBC: 0 % (ref 0.0–0.2)

## 2020-07-22 LAB — SARS CORONAVIRUS 2 (TAT 6-24 HRS): SARS Coronavirus 2: NEGATIVE

## 2020-07-22 MED ORDER — MAGNESIUM OXIDE 400 (241.3 MG) MG PO TABS
200.0000 mg | ORAL_TABLET | Freq: Every day | ORAL | Status: DC
  Administered 2020-07-22: 200 mg via ORAL
  Filled 2020-07-22: qty 1

## 2020-07-22 MED ORDER — ATORVASTATIN CALCIUM 20 MG PO TABS: 20.0000 mg | ORAL_TABLET | Freq: Every day | ORAL | Status: DC

## 2020-07-22 MED ORDER — SODIUM ZIRCONIUM CYCLOSILICATE 10 G PO PACK
10.0000 g | PACK | Freq: Every day | ORAL | Status: DC
  Administered 2020-07-22: 10 g via ORAL
  Filled 2020-07-22: qty 1

## 2020-07-22 MED ORDER — ALLOPURINOL 100 MG PO TABS
100.0000 mg | ORAL_TABLET | Freq: Every day | ORAL | Status: DC
Start: 2020-07-22 — End: 2020-07-22
  Administered 2020-07-22: 100 mg via ORAL
  Filled 2020-07-22: qty 1

## 2020-07-22 MED ORDER — RIVAROXABAN 10 MG PO TABS
10.0000 mg | ORAL_TABLET | Freq: Every day | ORAL | Status: DC
  Administered 2020-07-22: 10 mg via ORAL
  Filled 2020-07-22: qty 1

## 2020-07-22 MED ORDER — DOCUSATE SODIUM 100 MG PO CAPS: 100.0000 mg | ORAL_CAPSULE | Freq: Every day | ORAL | Status: DC | PRN

## 2020-07-22 MED ORDER — LEVETIRACETAM IN NACL 500 MG/100ML IV SOLN
500.0000 mg | Freq: Two times a day (BID) | INTRAVENOUS | Status: DC
  Filled 2020-07-22 (×2): qty 100

## 2020-07-22 MED ORDER — METOPROLOL SUCCINATE ER 50 MG PO TB24
50.0000 mg | ORAL_TABLET | Freq: Every day | ORAL | Status: DC
Start: 2020-07-22 — End: 2020-07-22
  Administered 2020-07-22: 50 mg via ORAL
  Filled 2020-07-22: qty 1

## 2020-07-22 NOTE — ED Notes (Signed)
Pt unable to sign for discharge. Called daughter and left voicemail that pt being sent home with EMS.

## 2020-07-22 NOTE — ED Notes (Signed)
Called daughter [name]. She is in [place] right now and will make her way back home to meet EMS.

## 2020-07-22 NOTE — ED Notes (Addendum)
Patent pulled off all leads and pulse oximeter again. Agitated and refusing to let this Rn place leads back on to him. Md  Noah Charon about this

## 2020-07-22 NOTE — ED Notes (Signed)
Spoke with attending, Dr Reesa Chew. She spoke with daughter who says she will have pt come home and she will look into placement options for him. Instructed by Dr Reesa Chew to call sister and ask if she prefers transport.

## 2020-07-22 NOTE — ED Notes (Addendum)
EMS called because no one was at house to meet them for pt to be discharged to home. EMS was sent to current address that is listed in pt's chart, which is [place] in [place]. Called daughter back. She said that her sister is already at the house waiting for EMS and they are not there. She says that the above address is NOT the patient's current address. His correct address is: [address] [place], [place]. Provided this address to EMS. EMS will bring pt to this address. Informed ED secretary and registration of error so listed address can be corrected. This note is written at 1605. Because pt is discharged, the above time listed on the note is the same as the discharge time.

## 2020-07-22 NOTE — Discharge Summary (Signed)
Physician Discharge Summary  [facility]. DUK:[id number] DOB: 08-Mar-1941 DOA: 07/21/2020  PCP: [name], DO  Admit date: 07/21/2020 Discharge date: 07/22/2020  Admitted From: Home Disposition: Home  Recommendations for Outpatient Follow-up:  1. Follow up with PCP in 1-2 weeks 2. Please obtain BMP/CBC in one week 3. Please follow up on the following pending results: Urine culture  Home Health: Aleatha Borer will resume his palliative care. Equipment/Devices: Discharge Condition: Fair CODE STATUS: Partial, DNI Diet recommendation: Heart Healthy   Brief/Interim Summary: Justin Shannon. is a 80 y.o. 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  Patient is being worked up outpatient for concerns of focal seizure.  He was hospitalized for seizure-like activity back in December 2021 but had negative work-up with CT head, MRI brain and EEG.  Neurology thought his symptoms likely could be attributed to progression of his neurodegenerative process.  At that time patient was taking Keppra, gabapentin, Lamictal and Seroquel.  He had adjustments to his gabapentin, Lamictal and Seroquel and now has been weaned off of them.  Keppra is currently his only antiepileptic. He has planned LTM EEG on Thursday outpatient with neurology.   In the ED, patient was agitated and yelling out. Pulling at IV lines. He was given IV Haldo and IV ativan which helped. Labs show no signs of infection.  He had hyperkalemia at 6.1 but this was hemolyzed.  He was given IV calcium gluconate.  UA and hemoglobin that showed signs of dehydration.  Patient was calm next morning when seen.  Does not require any sitter.  Potassium at 5.2 so he received one dose of Lokelma.  Appears to be at his baseline.   Discussed with her daughter and they are working with outpatient social services for long-term/memory unit placement.  He already had palliative care established for home and will resume those services.  UA is not very impressive for infection but urine cultures are pending.  Covid PCR was negative.  Patient needs frequent hydration.  No witnessed seizure.  Is being discharged and will follow up with his providers and outpatient neurologic work-up for questionable seizures.  Discharge Diagnoses:  Principal Problem:   Acute metabolic encephalopathy Active Problems:   Benign hypertension with CKD (chronic kidney disease) stage IV (HCC)   CKD (chronic kidney disease), stage IV (HCC)   Epilepsy (HCC)   History of DVT (deep vein thrombosis)   Type 2 diabetes mellitus with stage 4 chronic kidney disease, without long-term current use of insulin (HCC)   Hyperlipidemia   Discharge Instructions  Discharge Instructions    Diet - low sodium heart healthy   Complete by: As directed    Discharge instructions   Complete by: As directed    It was pleasure taking care of you. It is important that a family member keeps him well-hydrated. We will call you with abnormal culture results if needed. Keep working with your social worker and palliative care nurses for further management and placement.   Increase activity slowly   Complete by: As directed      Allergies as of 07/22/2020      Reactions   Shellfish Allergy Anaphylaxis, Other (See Comments)   gout   Iodinated Diagnostic Agents Other (See Comments)   Chronic kidney disease 3b   Nsaids Other (See Comments)  Chronic kidney disease 3b   Rivastigmine Other (See Comments)   Side effect and not a true allergy      Medication List    TAKE these medications   albuterol 108 (90 Base) MCG/ACT inhaler Commonly known as: VENTOLIN HFA Inhale 2 puffs into the lungs every 4 (four) hours as needed for wheezing.   allopurinol 100 MG  tablet Commonly known as: ZYLOPRIM Take 100 mg by mouth daily.   atorvastatin 20 MG tablet Commonly known as: LIPITOR Take 1 tablet (20 mg total) by mouth at bedtime.   docusate sodium 100 MG capsule Commonly known as: COLACE Take 100 mg by mouth daily as needed for mild constipation or moderate constipation.   fexofenadine 180 MG tablet Commonly known as: ALLEGRA Take 180 mg by mouth daily as needed for allergies.   Flovent HFA 110 MCG/ACT inhaler Generic drug: fluticasone Inhale 1 puff into the lungs in the morning and at bedtime.   fluticasone 50 MCG/ACT nasal spray Commonly known as: FLONASE Place 2 sprays into both nostrils daily as needed for allergies.   gabapentin 100 MG capsule Commonly known as: NEURONTIN Take 1 capsule (100 mg total) by mouth 2 (two) times daily.   lamoTRIgine 25 MG tablet Commonly known as: LAMICTAL Take 6 tablets (150 mg total) by mouth 2 (two) times daily Follow titration instructions given in office by your neurology.   levETIRAcetam 500 MG tablet Commonly known as: KEPPRA Take 500 mg by mouth 2 (two) times daily.   Magnesium 250 MG Tabs Take 250 mg by mouth daily.   melatonin 5 MG Tabs Take 5 mg by mouth at bedtime as needed (sleep).   metoprolol succinate 50 MG 24 hr tablet Commonly known as: TOPROL-XL Take 50 mg by mouth daily.   ONE-A-DAY MENS PO Take 1 tablet by mouth daily.   Xarelto 10 MG Tabs tablet Generic drug: rivaroxaban Take 1 tablet (10 mg total) by mouth daily before breakfast.       Follow-up Information    [name], DO. Schedule an appointment as soon as possible for a visit.   Specialty: Family Medicine Contact information: [address] [place] [address] [phone]              Allergies  Allergen Reactions  . Shellfish Allergy Anaphylaxis and Other (See Comments)    gout  . Iodinated Diagnostic Agents Other (See Comments)    Chronic kidney disease 3b  . Nsaids Other (See  Comments)    Chronic kidney disease 3b  . Rivastigmine Other (See Comments)    Side effect and not a true allergy    Consultations:  None  Procedures/Studies: CT ABDOMEN PELVIS WO CONTRAST  Result Date: 07/21/2020 CLINICAL DATA:  Abdominal pain.  Decreased urine output. EXAM: CT ABDOMEN AND PELVIS WITHOUT CONTRAST TECHNIQUE: Multidetector CT imaging of the abdomen and pelvis was performed following the standard protocol without IV contrast. COMPARISON:  None. FINDINGS: Lower chest: The lung bases are clear.There is cardiomegaly. Hepatobiliary: The liver is normal. Normal gallbladder.There is no biliary ductal dilation. Pancreas: Normal contours without ductal dilatation. No peripancreatic fluid collection. Spleen: Unremarkable. Adrenals/Urinary Tract: --Adrenal glands: Unremarkable. --Right kidney/ureter: No hydronephrosis or radiopaque kidney stones. --Left kidney/ureter: No hydronephrosis or radiopaque kidney stones. --Urinary bladder: Unremarkable. Stomach/Bowel: --Stomach/Duodenum: No hiatal hernia or other gastric abnormality. Normal duodenal course and caliber. --Small bowel: Unremarkable. --Colon: Rectosigmoid diverticulosis without acute inflammation. --Appendix: Not visualized. No right lower quadrant inflammation or free fluid. Vascular/Lymphatic: Atherosclerotic calcification  is present within the non-aneurysmal abdominal aorta, without hemodynamically significant stenosis. There is an IVC filter in place. --No retroperitoneal lymphadenopathy. --No mesenteric lymphadenopathy. --No pelvic or inguinal lymphadenopathy. Reproductive: Unremarkable Other: No ascites or free air. The abdominal wall is normal. Musculoskeletal. No acute displaced fractures. IMPRESSION: 1. Examination is limited by motion artifact and lack of IV contrast. 2. Given this limitation, no acute abnormality was detected. 3. Cardiomegaly Aortic Atherosclerosis (ICD10-I70.0). Electronically Signed   By: Constance Holster M.D.    On: 07/21/2020 21:37   DG Chest 1 View  Result Date: 06/28/2020 CLINICAL DATA:  History of recent fall EXAM: CHEST  1 VIEW COMPARISON:  06/04/2020 FINDINGS: Cardiac shadow is stable. Aortic calcifications are noted. Stable right basilar atelectasis is noted. No bony abnormality is noted. IMPRESSION: Right basilar atelectasis stable from the prior exam. No acute abnormality noted. Electronically Signed   By: Inez Catalina M.D.   On: 06/28/2020 12:41   CT Head Wo Contrast  Result Date: 07/21/2020 CLINICAL DATA:  Altered mental status. EXAM: CT HEAD WITHOUT CONTRAST TECHNIQUE: Contiguous axial images were obtained from the base of the skull through the vertex without intravenous contrast. COMPARISON:  Head CT 06/04/2020, brain MRI 06/05/2020 FINDINGS: Brain: Despite repeat acquisition, there is motion artifact. No evidence of acute hemorrhage. Stable degree of atrophy and chronic small vessel ischemia. Remote left cerebellar infarct. No large subdural or extra-axial collection. No hydrocephalus. More detailed assessment is limited. Vascular: No hyperdense vessel. Skull: Grossly negative allowing for motion. Sinuses/Orbits: No acute findings. Other: Tiny right frontal scalp lipoma. IMPRESSION: 1. Motion limited exam. No evidence of acute intracranial abnormality. 2. Stable atrophy, chronic small vessel ischemia, and remote left cerebellar infarct. Electronically Signed   By: Keith Rake M.D.   On: 07/21/2020 21:35   DG Hip Unilat W or Wo Pelvis 2-3 Views Left  Result Date: 06/28/2020 CLINICAL DATA:  Left-sided groin pain following fall several weeks ago, initial encounter EXAM: DG HIP (WITH OR WITHOUT PELVIS) 2-3V LEFT COMPARISON:  None. FINDINGS: Pelvic ring is intact. Degenerative changes of the hip joints are noted bilaterally. No definitive fracture or dislocation is seen. No soft tissue abnormality is noted. IMPRESSION: Degenerative change without acute abnormality. Electronically Signed   By: Inez Catalina M.D.   On: 06/28/2020 12:43     Subjective: Patient was able to tell me his name and when asked about birthday stating 70.  Appears to be at his baseline.  He was calm and stating that he does not have any pain.  Denies any dysuria.  Unable to tell me the name of his family members. Talked with daughter on phone who is his primary caregiver, family is working with 2 different social workers and palliative care for a memory unit placement. I agreed to be discharged and keep working for placement as outpatient as long-term/memory care unit placement is not an option from inpatient. Patient is not rehab able.  Discharge Exam: Vitals:   07/22/20 0554 07/22/20 1005  BP: (S) (!) 165/119 (!) 167/93  Pulse: 96 67  Resp: 18 (!) 25  Temp:    SpO2: 98% 100%   Vitals:   07/21/20 2330 07/22/20 0130 07/22/20 0554 07/22/20 1005  BP: (!) 102/91 (!) 174/107 (S) (!) 165/119 (!) 167/93  Pulse: 87 94 96 67  Resp: (!) 26 20 18  (!) 25  Temp:      TempSrc:      SpO2: 99% 99% 98% 100%  Weight:  Height:        General: Pt is alert, awake, not in acute distress, resting and intentional tremor involving bilateral upper extremities. Cardiovascular: RRR, S1/S2 +, no rubs, no gallops Respiratory: CTA bilaterally, no wheezing, no rhonchi Abdominal: Soft, NT, ND, bowel sounds + Extremities: no edema, no cyanosis   The results of significant diagnostics from this hospitalization (including imaging, microbiology, ancillary and laboratory) are listed below for reference.    Microbiology: Recent Results (from the past 240 hour(s))  SARS CORONAVIRUS 2 (TAT 6-24 HRS) Nasopharyngeal Nasopharyngeal Swab     Status: None   Collection Time: 07/21/20  9:07 PM   Specimen: Nasopharyngeal Swab  Result Value Ref Range Status   SARS Coronavirus 2 NEGATIVE NEGATIVE Final    Comment: (NOTE) SARS-CoV-2 target nucleic acids are NOT DETECTED.  The SARS-CoV-2 RNA is generally detectable in upper and  lower respiratory specimens during the acute phase of infection. Negative results do not preclude SARS-CoV-2 infection, do not rule out co-infections with other pathogens, and should not be used as the sole basis for treatment or other patient management decisions. Negative results must be combined with clinical observations, patient history, and epidemiological information. The expected result is Negative.  Fact Sheet for Patients: SugarRoll.be  Fact Sheet for Healthcare Providers: https://www.woods-mathews.com/  This test is not yet approved or cleared by the [place] FDA and  has been authorized for detection and/or diagnosis of SARS-CoV-2 by FDA under an Emergency Use Authorization (EUA). This EUA will remain  in effect (meaning this test can be used) for the duration of the COVID-19 declaration under Se ction 564(b)(1) of the Act, 21 U.S.C. section 360bbb-3(b)(1), unless the authorization is terminated or revoked sooner.  Performed at [facility], [place] [address], [place], [place] [address]      Labs: BNP (last 3 results) No results for input(s): BNP in the last 8760 hours. Basic Metabolic Panel: Recent Labs  Lab 07/21/20 2023 07/21/20 2107 07/22/20 0546  NA 138 139 139  K 6.1* 4.8 5.2*  CL 102 104 107  CO2 23 25 23   GLUCOSE 88 91 84  BUN 34* 33* 30*  CREATININE 2.03* 2.02* 1.80*  CALCIUM 10.2 10.3 10.1   Liver Function Tests: Recent Labs  Lab 07/21/20 2023  AST 49*  ALT 30  ALKPHOS 93  BILITOT 0.9  PROT 8.0  ALBUMIN 3.8   No results for input(s): LIPASE, AMYLASE in the last 168 hours. No results for input(s): AMMONIA in the last 168 hours. CBC: Recent Labs  Lab 07/21/20 2023 07/22/20 0546  WBC 6.1 7.1  NEUTROABS 3.6  --   HGB 10.5* 10.1*  HCT 33.6* 30.9*  MCV 71.2* 70.2*  PLT 197 186   Cardiac Enzymes: No results for input(s): CKTOTAL, CKMB, CKMBINDEX, TROPONINI in the last 168  hours. BNP: Invalid input(s): POCBNP CBG: Recent Labs  Lab 07/21/20 2017  GLUCAP 81   D-Dimer No results for input(s): DDIMER in the last 72 hours. Hgb A1c No results for input(s): HGBA1C in the last 72 hours. Lipid Profile No results for input(s): CHOL, HDL, LDLCALC, TRIG, CHOLHDL, LDLDIRECT in the last 72 hours. Thyroid function studies No results for input(s): TSH, T4TOTAL, T3FREE, THYROIDAB in the last 72 hours.  Invalid input(s): FREET3 Anemia work up No results for input(s): VITAMINB12, FOLATE, FERRITIN, TIBC, IRON, RETICCTPCT in the last 72 hours. Urinalysis    Component Value Date/Time   COLORURINE YELLOW (A) 07/21/2020 2107   APPEARANCEUR CLEAR (A) 07/21/2020 2107  APPEARANCEUR Clear 08/03/2019 1102   LABSPEC 1.018 07/21/2020 2107   LABSPEC 1.012 09/02/2012 0435   PHURINE 6.0 07/21/2020 2107   GLUCOSEU NEGATIVE 07/21/2020 2107   GLUCOSEU Negative 09/02/2012 0435   HGBUR NEGATIVE 07/21/2020 2107   BILIRUBINUR NEGATIVE 07/21/2020 2107   BILIRUBINUR Negative 08/03/2019 [place] Negative 09/02/2012 [place] 07/21/2020 2107   PROTEINUR 100 (A) 07/21/2020 2107   NITRITE NEGATIVE 07/21/2020 2107   LEUKOCYTESUR NEGATIVE 07/21/2020 2107   LEUKOCYTESUR Negative 09/02/2012 0435   Sepsis Labs Invalid input(s): PROCALCITONIN,  WBC,  LACTICIDVEN Microbiology Recent Results (from the past 240 hour(s))  SARS CORONAVIRUS 2 (TAT 6-24 HRS) Nasopharyngeal Nasopharyngeal Swab     Status: None   Collection Time: 07/21/20  9:07 PM   Specimen: Nasopharyngeal Swab  Result Value Ref Range Status   SARS Coronavirus 2 NEGATIVE NEGATIVE Final    Comment: (NOTE) SARS-CoV-2 target nucleic acids are NOT DETECTED.  The SARS-CoV-2 RNA is generally detectable in upper and lower respiratory specimens during the acute phase of infection. Negative results do not preclude SARS-CoV-2 infection, do not rule out co-infections with other pathogens, and should not be  used as the sole basis for treatment or other patient management decisions. Negative results must be combined with clinical observations, patient history, and epidemiological information. The expected result is Negative.  Fact Sheet for Patients: SugarRoll.be  Fact Sheet for Healthcare Providers: https://www.woods-mathews.com/  This test is not yet approved or cleared by the [place] FDA and  has been authorized for detection and/or diagnosis of SARS-CoV-2 by FDA under an Emergency Use Authorization (EUA). This EUA will remain  in effect (meaning this test can be used) for the duration of the COVID-19 declaration under Se ction 564(b)(1) of the Act, 21 U.S.C. section 360bbb-3(b)(1), unless the authorization is terminated or revoked sooner.  Performed at [facility], [place] [address] [place], [place] [address]     Time coordinating discharge: Over 30 minutes  SIGNED:  Lorella Nimrod, MD  [facility] 07/22/2020, 11:58 AM  If 7PM-7AM, please contact night-coverage [email or web address]  This record has been created using Systems analyst. Errors have been sought and corrected,but may not always be located. Such creation errors do not reflect on the standard of care.

## 2020-07-22 NOTE — ED Notes (Signed)
Pericare performed and brief changed. New warm blankets applied to patient

## 2020-07-22 NOTE — ED Notes (Signed)
Per Dr Flossie Buffy ok to leave patient off monitor and spot check vitals as patient removing monitor leads.

## 2020-07-22 NOTE — ED Notes (Addendum)
Peri care and change of chux performed by float nurse.

## 2020-07-22 NOTE — ED Notes (Signed)
Dr. Amin at bedside.

## 2020-07-22 NOTE — ED Notes (Signed)
Patient incontinent of urine. Patient pad and  Bed linen changed.

## 2020-07-22 NOTE — ED Notes (Signed)
Spoke with daughter, Justin Shannon. Dr Reesa Chew had spoken with her. He lives with [place]. Asked what transport options she prefers. Discussed possibility of EMS transport. Her husband gets home at 1600. She elects EMS transport. She would like a phone call to know when EMS arrives for him. Explained that it will likely take hours. Justin Shannon would like night nurse to notify her at [phone]. She requests PRN ativan prescription for pt. Wrote to Dr Reesa Chew to ask for prescription.

## 2020-07-22 NOTE — ED Notes (Signed)
Lunch tray and water provided to pt. Pt refused. Pt removed chux that had been placed over him to catch urine. Pt not wet at this time. Contacted attending provider to ask about plan of care. Pt is unable to follow commands or even help to turn self in bed. Pt in NAD at this time.

## 2020-07-22 NOTE — ED Notes (Signed)
Pt falling asleep. Unable to drink more than 1 sip of Lokelma packet at this time. Will continue to offer sips.

## 2020-07-22 NOTE — ED Notes (Signed)
Charge RN Crowley notified about patient requiring Air cabin crew for 1:1

## 2020-07-22 NOTE — ED Notes (Signed)
Checked on pt. Pt resting in bed. Pt appears to be at ease. Covered with blanket.

## 2020-07-23 ENCOUNTER — Telehealth: Payer: Self-pay

## 2020-07-23 NOTE — Telephone Encounter (Signed)
Received message to call daughter. Spoke with patient's daughter, who shared patient was in the hospital for dehydration. Patient is requiring to be fed and will eat and drink if he assisted. Daughter would like to have NP come and evaluate him to determine if patient is ready for hospice. Would also like to discuss possible placement in SNF. Visit scheduled for 2/1 @ 11am

## 2020-07-24 ENCOUNTER — Other Ambulatory Visit: Payer: Medicare Other | Admitting: Primary Care

## 2020-07-24 ENCOUNTER — Other Ambulatory Visit: Payer: Self-pay

## 2020-07-24 ENCOUNTER — Ambulatory Visit (INDEPENDENT_AMBULATORY_CARE_PROVIDER_SITE_OTHER): Payer: Medicare Other | Admitting: Licensed Clinical Social Worker

## 2020-07-24 DIAGNOSIS — Z515 Encounter for palliative care: Secondary | ICD-10-CM

## 2020-07-24 DIAGNOSIS — E1122 Type 2 diabetes mellitus with diabetic chronic kidney disease: Secondary | ICD-10-CM

## 2020-07-24 DIAGNOSIS — Z6841 Body Mass Index (BMI) 40.0 and over, adult: Secondary | ICD-10-CM

## 2020-07-24 DIAGNOSIS — I739 Peripheral vascular disease, unspecified: Secondary | ICD-10-CM | POA: Diagnosis not present

## 2020-07-24 DIAGNOSIS — I825Y9 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: Secondary | ICD-10-CM

## 2020-07-24 DIAGNOSIS — G3183 Dementia with Lewy bodies: Secondary | ICD-10-CM

## 2020-07-24 DIAGNOSIS — I129 Hypertensive chronic kidney disease with stage 1 through stage 4 chronic kidney disease, or unspecified chronic kidney disease: Secondary | ICD-10-CM

## 2020-07-24 DIAGNOSIS — N184 Chronic kidney disease, stage 4 (severe): Secondary | ICD-10-CM

## 2020-07-24 DIAGNOSIS — F0280 Dementia in other diseases classified elsewhere without behavioral disturbance: Secondary | ICD-10-CM

## 2020-07-24 DIAGNOSIS — F0281 Dementia in other diseases classified elsewhere with behavioral disturbance: Secondary | ICD-10-CM

## 2020-07-24 LAB — URINE CULTURE: Culture: NO GROWTH

## 2020-07-24 LAB — LAMOTRIGINE LEVEL: Lamotrigine Lvl: <1 ug/mL — ABNORMAL LOW (ref 2.0–20.0)

## 2020-07-24 NOTE — Chronic Care Management (AMB) (Signed)
Chronic Care Management    Clinical Social Work Note  07/24/2020 Name: [facility]. MRN: [id number] DOB: 1941-04-13  Justin Shannon. is a 80 y.o. year old male who is a primary care patient of [name], DO. The CCM team was consulted to assist the patient with chronic disease management and/or care coordination needs related to: Level of Care Concerns.   Engaged with patient by telephone for follow up visit in response to provider referral for social work chronic care management and care coordination services.   Consent to Services:  The patient was given the following information about Chronic Care Management services today, agreed to services, and gave verbal consent: 1. CCM service includes personalized support from designated clinical staff supervised by the primary care provider, including individualized plan of care and coordination with other care providers 2. 24/7 contact phone numbers for assistance for urgent and routine care needs. 3. Service will only be billed when office clinical staff spend 20 minutes or more in a month to coordinate care. 4. Only one practitioner may furnish and bill the service in a calendar month. 5.The patient may stop CCM services at any time (effective at the end of the month) by phone call to the office staff. 6. The patient will be responsible for cost sharing (co-pay) of up to 20% of the service fee (after annual deductible is met). Patient agreed to services and consent obtained.  Patient agreed to services and consent obtained.   Assessment: Review of patient past medical history, allergies, medications, and health status, including review of relevant consultants reports was performed today as part of a comprehensive evaluation and provision of chronic care management and care coordination services.     SDOH (Social Determinants of Health) assessments and interventions performed:    Advanced Directives Status: See Care Plan for  related entries.  CCM Care Plan  Allergies  Allergen Reactions  . Shellfish Allergy Anaphylaxis and Other (See Comments)    gout  . Iodinated Diagnostic Agents Other (See Comments)    Chronic kidney disease 3b  . Nsaids Other (See Comments)    Chronic kidney disease 3b  . Rivastigmine Other (See Comments)    Side effect and not a true allergy    Outpatient Encounter Medications as of 07/24/2020  Medication Sig  . albuterol (VENTOLIN HFA) 108 (90 Base) MCG/ACT inhaler Inhale 2 puffs into the lungs every 4 (four) hours as needed for wheezing.  Marland Kitchen allopurinol (ZYLOPRIM) 100 MG tablet Take 100 mg by mouth daily.  Marland Kitchen atorvastatin (LIPITOR) 20 MG tablet Take 1 tablet (20 mg total) by mouth at bedtime.  . docusate sodium (COLACE) 100 MG capsule Take 100 mg by mouth daily as needed for mild constipation or moderate constipation.   . fexofenadine (ALLEGRA) 180 MG tablet Take 180 mg by mouth daily as needed for allergies.   . fluticasone (FLONASE) 50 MCG/ACT nasal spray Place 2 sprays into both nostrils daily as needed for allergies.   . fluticasone (FLOVENT HFA) 110 MCG/ACT inhaler Inhale 1 puff into the lungs in the morning and at bedtime.  . gabapentin (NEURONTIN) 100 MG capsule Take 1 capsule (100 mg total) by mouth 2 (two) times daily. (Patient not taking: Reported on 06/29/2020)  . lamoTRIgine (LAMICTAL) 25 MG tablet Take 6 tablets (150 mg total) by mouth 2 (two) times daily Follow titration instructions given in office by your neurology.  . levETIRAcetam (KEPPRA) 500 MG tablet Take 500 mg by mouth 2 (two) times daily.   Marland Kitchen  Magnesium 250 MG TABS Take 250 mg by mouth daily.  . melatonin 5 MG TABS Take 5 mg by mouth at bedtime as needed (sleep). (Patient not taking: Reported on 06/29/2020)  . metoprolol succinate (TOPROL-XL) 50 MG 24 hr tablet Take 50 mg by mouth daily.   . Multiple Vitamin (ONE-A-DAY MENS PO) Take 1 tablet by mouth daily.   Alveda Reasons 10 MG TABS tablet Take 1 tablet (10 mg total) by  mouth daily before breakfast.   No facility-administered encounter medications on file as of 07/24/2020.    Patient Active Problem List   Diagnosis Date Noted  . Acute metabolic encephalopathy 01/65/5374  . COPD (chronic obstructive pulmonary disease) ([place]) 07/20/2020  . Slurred speech 06/04/2020  . CKD (chronic kidney disease) stage 3, GFR 30-59 ml/min (HCC) 06/04/2020  . Essential hypertension 06/04/2020  . Hyperlipidemia 06/04/2020  . Lewy body dementia without behavioral disturbance ([place]) 05/21/2020  . Hyperlipidemia associated with type 2 diabetes mellitus ([place]) 05/21/2020  . Status post total right knee replacement 01/22/2020  . Anemia of chronic kidney failure, stage 3 (moderate) ([place]) 01/16/2020  . Total knee replacement status 11/02/2019  . History of total knee arthroplasty 11/02/2019  . PAD (peripheral artery disease) ([place]) 10/13/2019  . Benign prostatic hyperplasia   . Seizures ([place])   . Anemia due to stage 4 chronic kidney disease ([place]) 06/24/2019  . Generalized weakness 06/24/2019  . Anemia in chronic kidney disease ([facility]) 06/24/2019  . Asthenia 06/24/2019  . Primary osteoarthritis of right knee 04/03/2019  . Ankle ulcer ([place]) 05/08/2018  . Use of cane as ambulatory aid 04/27/2018  . Dependence on other enabling machines and devices 04/27/2018  . Type 2 diabetes mellitus with stage 4 chronic kidney disease, without long-term current use of insulin ([place]) 06/11/2017  . Lymphedema 05/04/2016  . Chronic venous insufficiency 05/04/2016  . Toe sprain 01/24/2016  . Morbid obesity with body mass index of 40.0-44.9 in adult [facility]) 05/24/2014  . Body mass index (BMI)40.0-44.9, adult 05/24/2014  . OSA on CPAP 01/10/2014  . CKD (chronic kidney disease), stage IV ([place]) 11/22/2013  . Allergic rhinitis 11/06/2013  . Benign hypertension with CKD (chronic kidney disease) stage IV ([place]) 11/06/2013  . Epilepsy ([place]) 11/06/2013  . Gout 11/06/2013  . History of DVT (deep vein  thrombosis) 11/06/2013  . Chronic deep vein thrombosis (DVT) of proximal vein of lower extremity ([place]) 11/06/2013  . 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 Personal history of other venous thrombosis and embolism 11/06/2013    Care Plan : General Social Work (Adult)  Updates made by Greg Cutter, LCSW since 07/24/2020 12:00 AM    Problem: Caregiver Stress     Long-Range Goal: Caregiver Coping Optimized   Start Date: 06/14/2020  Priority: Medium  Note:    Timeframe:  Long-Range Goal Priority:  Medium Start Date:  06/14/20, goal revisited on 07/17/20                        Expected End Date: 09/12/20                    Follow Up Date- 45 days from 07/17/20   - avoid busy places that are confusing such as the shopping malls - check hot water heater temperature, turn down if too hot - get rid of poisonous plants - keep medicine where it is safe or locked - keep power  tools, knives and cleaning products in a safe place - put deadbolts either high or low on outside doors - put car keys out of sight - remove knobs from stove and oven - remove vitamins, medicine, sugar substitutes and seasonings from the kitchen table and counters - supervise the use of tobacco and alcohol - use a GPS device in the car - use an audio or video monitor - use appliances that have an auto shut-off feature - use motion-sensor alarms outdoors, in the bedroom and the kitchen - use seat cushions, floor mats and bed pads that are wired to alarm when getting up    Why is this important?    Safety is important when you or your loved one has dementia.   Eyesight, hearing and changes in feelings of hot and cold or depth-perception may occur.   Wandering or getting lost may happen.   You/your loved one may have to stop driving.   Taking steps to improve safety can prevent injuries.   It will also help you/your loved one feel  more relaxed.   It will help you/your loved one be independent for a longer time.      Timeframe:  Long-Range Goal Priority:  Medium Start Date:  06/14/20, goal revisited on 07/24/20                        Expected End Date: 09/12/20                    Follow Up Date- 45 days from 07/24/20   - avoid busy places that are confusing such as the shopping malls - check hot water heater temperature, turn down if too hot - get rid of poisonous plants - keep medicine where it is safe or locked - keep power tools, knives and cleaning products in a safe place - put deadbolts either high or low on outside doors - put car keys out of sight - remove knobs from stove and oven - remove vitamins, medicine, sugar substitutes and seasonings from the kitchen table and counters - supervise the use of tobacco and alcohol - use a GPS device in the car - use an audio or video monitor - use appliances that have an auto shut-off feature - use motion-sensor alarms outdoors, in the bedroom and the kitchen - use seat cushions, floor mats and bed pads that are wired to alarm when getting up    Why is this important?    Safety is important when you or your loved one has dementia.   Eyesight, hearing and changes in feelings of hot and cold or depth-perception may occur.   Wandering or getting lost may happen.   You/your loved one may have to stop driving.   Taking steps to improve safety can prevent injuries.   It will also help you/your loved one feel more relaxed.   It will help you/your loved one be independent for a longer time.     Assessment, press & current barriers:  Patient unable to consistently perform activities of daily living and needs additional assistance and support in order to meet this unmet need . Limited social support, ADL IADL limitations, Social Isolation, Limited access to caregiver, Cognitive Deficits, Memory Deficits, Inability to perform ADL's independently, and Inability to  perform IADL's independently . Unable to self administer medications as prescribed . Lacks social connections  Clinical Goals:   Over the next 120 days, patient/caregiver will work with  SW to address concerns related to care coordination needs, lack of a stable support network and lack of Brewing technologist. LCSW will assist patient in gaining additional support in order to maintain health and mental health appropriately   Over the next 120 days, patient will demonstrate improved adherence to self care as evidenced by implementing healthy self-care into his daily routine such as: attending all medical appointments, deep breathing exercises, taking time for self-reflection, taking medications as prescribed, drinking water and daily exercise to improve mobility and mood.   Over the next 120 days, patient will demonstrate improved health management independence as evidenced by implementing healthy self-care skills and positive support/resource implementation into their daily routine to help cope with stressors and improve overall health and well-being   Over the next 120 days, patient or caregiver will verbalize basic understanding of depression/stress process and self health management plan as evidenced by his participation in development of long term plan of care and institution of self health management strategies  Interventions : . Assessed needs, level of care concerns, basic eligibility and provided education on available PCS resources within their area. . Daughter reports that Palliative Care completed a home visit today and they were informed that patient is not eligible for Hospice.  . Patient has Lewy Body Dementia and is in need of a higher level of care/support. Family prefers in home support over LTC placement at this time. Reviewed community support options ( CAP, private pay, PACE program) . Will collaborate with primary care provider when needed . Daughter reports  that they are looking into getting a foyer lift because patient is unable to use his left side of the body and is unable to ambulate out of the bed. . Daughter reports that family applied for Special Assistance Medicaid but several facilities with memory care that they were looking into were only able to accept patients under private pay for 3 years until they would accept him for Medicaid. UPDATE- Daughter is going to apply for long term care Medicaid (for skilled nursing) today on 07/24/20. Daughter was advised to contact DSS to advise them to disregard the Special Assistance Medicaid application but to consider the LTC application. Daughter will contact DSS today. Daughter has been talking with the admissions coordinator at [facility] regarding their long term care program.  . [place] ended because patient was not making progress with therapy.  . Email sent again to daughter on 07/24/20 with personal care service resources and long term care facilities within her area.  . Patient interviewed and appropriate assessments performed . Referred patient to community resources care guide team for assistance with in home support through [facility] . Care Guide referral placed in the past but they were unable to provide any further community resource assistance.  . Provided mental health counseling with regard to caregiver strain . Discussed plans with patient for ongoing care management follow up and provided patient with direct contact information for care management team . Assisted patient/caregiver with obtaining information about health plan benefits . Provided education and assistance to client regarding Advanced Directives. . Provided education to patient/caregiver regarding level of care options. . Provided education to patient/caregiver about Hospice and/or Palliative Care services . 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 Marland Kitchen Patient completed neurologist appointment on 06/14/20. Marland Kitchen CCM  LCSW educated family on LTC placement. Patient is no longer needing memory care but LTC skilled nursing.  . Other interventions provided: Solution-Focused Strategies   Follow  Up Plan:  . SW will follow up with patient by phone over the next quarter       Follow Up Plan: [place] will reach out to patient to reschedule appointment.      Eula Fried, BSW, MSW, [facility] Care Management [email or web address] Phone: [phone]

## 2020-07-24 NOTE — Progress Notes (Signed)
[facility] [facility] Palliative Care Consult Note Telephone: [phone]  Fax: [phone]     Date of encounter: 07/24/20 PATIENT NAME: Justin Shannon [place] [id number] [phone] (home)  DOB: [birth year] MRN: [id number]  PRIMARY CARE PROVIDER:    [name], DO,  [address] [place] [address] [phone]  REFERRING PROVIDER:   [name], DO [address] [place],  [place] [address] [phone]  RESPONSIBLE PARTY:   Extended Emergency Contact Information Primary Emergency Contact: Bo, [place], [place] [address] Johnnette Litter of [place] Phone: [phone] Work Phone: [phone] Mobile Phone: [phone] Relation: Daughter Secondary Emergency Contact: [name] Address: [address] [place]          [place], [place] [address] [place] Phone: [phone] Mobile Phone: [phone] Relation: Spouse  I met face to face with patient and family in  Home. Palliative Care was asked to follow this patient by consultation request of Nobie Putnam * to help address advance care planning and goals of care. This is a follow up visit.   ASSESSMENT AND RECOMMENDATIONS:   1. Advance Care Planning/Goals of Care: Goals include to maximize quality of life and symptom management. Our advance care planning conversation included a discussion about:     The value and importance of advance care planning   Exploration of personal, cultural or spiritual beliefs that might influence medical decisions   Exploration of goals of care in the event of a sudden injury or illness   ACP: We discussed their advanced planning for his long term care; initially they had looked at assisted living memory care in the fall when he was more mobile. Now he is wheelchair bound and needs mechanical assistance to go from bed to chair. They have a lift on order.   Hospice  programming: I provided education on the Hospice program and on conditions to enroll. We discussed six month prognosis and how providers prognosticate this. We discussed the extent of services and that they could be supplied in home or in a facility. Mr Sudol has declined significantly in the past nine months. He had a surgery for knee replacement in May 2021 suffered a Patella tear and has not returned to his baseline through several more nursing home stays and hospitalizations. 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 Family has said this has not been definitively given yet and that neurology is wanting to do a study to rule out seizure activity. In any event his debility appears to be progressing significantly.   I spent 20 minutes providing this consultation,  from 1100 to 1120. More than 50% of the time in this consultation was spent in counseling and care coordination.  2. Symptom Management:   I  met with Mr Signor and his family in their home. He had been to the emergency room a few days ago for dehydration. Family has identified that care needs exceed what they can perform in the home with current resources.   Nutrition: He's eating less as well although now once his dehydration was rectified he began  eating better. However, he appears to be labile metabolically if his intake is poor. We discussed him taking water ,electrolyte supplements such as Gatorade, and nutrition drinks if he's unable to eat.   Mobility: Declining in ability to assist in transfers. Family must move him without device. Daughter states hoyer lift is  ordered by PCP. Education RE injury prevention with caregivers, both patient and themselves.  Community Resources to assist caregiver burden: We discussed resources in the community including [employer] ,Medicaid funding through [facility] and [place] [facility].  We discussed them hiring private aides at least for the short term to help while they  are deciding on a long term plan. We discussed the financial burden of hiring caregiving. [name] wife is also in poor health and being careful cared for by another one of the daughters.  3. Follow up Palliative Care Visit: Palliative care will continue to follow for goals of care clarification and symptom management. Return 4 weeks or prn.  4. Family /Caregiver/Community Supports: Lives in home of daughter with family assisting.   5. Cognitive / Functional decline: A and O x 1-2, dementia, dependent in all adls and iadls,   CODE STATUS: FULL  PPS: 30%  HOSPICE ELIGIBILITY/DIAGNOSIS: TBD  Subjective:  CHIEF COMPLAINT: decline/FTT  HISTORY OF PRESENT ILLNESS:  [name]. is a 80 y.o. year old male  with h/o seizures, copd, dementia type tbd, hallucinations, seizures, OA and immobility. He presents with continued decline, FTT,  a hospital trip for dehydration due to poor intake. He has had 4 hospital stays or ED trips in 3 months. He has declined to not be able to feed himself, or to take liquids (I). Feeding must be supported. He also has not been able to help with tranfers, prior he could stand to pivot. He has lost some weight due to poor intake, approx 15 lbs ( 7%). BMI is 38. Caregiver strain is escalating and family wishes to discuss placement vis a vis hospice services in the context of decline and progressive illness.  We are asked to consult around advance care planning and complex medical decision making.    Review and summarization of old Epic records shows or history from other than patient RE recent ED trip. Review or lab tests, radiology,  or medicine Albumin WNL, K+= > 5, GFR 38 Review of case with family member= daughter and wife  History obtained from review of EMR, discussion with primary team, and  interview with family, caregiver  and/or Mr. Deeney. Records reviewed and summarized above.   CURRENT PROBLEM LIST:  Patient Active Problem List   Diagnosis Date  Noted  . Acute metabolic encephalopathy 79/44/0347  . COPD (chronic obstructive pulmonary disease) ([place]) 07/20/2020  . Slurred speech 06/04/2020  . CKD (chronic kidney disease) stage 3, GFR 30-59 ml/min (HCC) 06/04/2020  . Essential hypertension 06/04/2020  . Hyperlipidemia 06/04/2020  . Lewy body dementia without behavioral disturbance ([place]) 05/21/2020  . Hyperlipidemia associated with type 2 diabetes mellitus ([place]) 05/21/2020  . Status post total right knee replacement 01/22/2020  . Anemia of chronic kidney failure, stage 3 (moderate) ([place]) 01/16/2020  . Total knee replacement status 11/02/2019  . History of total knee arthroplasty 11/02/2019  . PAD (peripheral artery disease) ([place]) 10/13/2019  . Benign prostatic hyperplasia   . Seizures ([place])   . Anemia due to stage 4 chronic kidney disease ([place]) 06/24/2019  . Generalized weakness 06/24/2019  . Anemia in chronic kidney disease (CODE) 06/24/2019  . Asthenia 06/24/2019  . Primary osteoarthritis of right knee 04/03/2019  . Ankle ulcer ([place]) 05/08/2018  . Use of cane as ambulatory aid 04/27/2018  . Dependence on other enabling machines and devices 04/27/2018  . Type 2 diabetes mellitus with stage 4 chronic kidney disease, without long-term current use of insulin ([place]) 06/11/2017  .  Lymphedema 05/04/2016  . Chronic venous insufficiency 05/04/2016  . Toe sprain 01/24/2016  . Morbid obesity with body mass index of 40.0-44.9 in adult [facility]) 05/24/2014  . Body mass index (BMI)40.0-44.9, adult 05/24/2014  . OSA on CPAP 01/10/2014  . CKD (chronic kidney disease), stage IV ([place]) 11/22/2013  . Allergic rhinitis 11/06/2013  . Benign hypertension with CKD (chronic kidney disease) stage IV ([place]) 11/06/2013  . Epilepsy ([place]) 11/06/2013  . Gout 11/06/2013  . History of DVT (deep vein thrombosis) 11/06/2013  . Chronic deep vein thrombosis (DVT) of proximal vein of lower extremity ([place]) 11/06/2013  . Chronic embolism and thrombosis of unspecified  deep veins of unspecified proximal lower extremity ([place]) 11/06/2013  . Personal history of other venous thrombosis and embolism 11/06/2013   PAST MEDICAL HISTORY:  Active Ambulatory Problems    Diagnosis Date Noted  . Allergic rhinitis 11/06/2013  . Benign hypertension with CKD (chronic kidney disease) stage IV ([place]) 11/06/2013  . CKD (chronic kidney disease), stage IV ([place]) 11/22/2013  . Epilepsy ([place]) 11/06/2013  . Gout 11/06/2013  . History of DVT (deep vein thrombosis) 11/06/2013  . OSA on CPAP 01/10/2014  . Toe sprain 01/24/2016  . Lymphedema 05/04/2016  . Chronic venous insufficiency 05/04/2016  . Morbid obesity with body mass index of 40.0-44.9 in adult [facility]) 05/24/2014  . Chronic deep vein thrombosis (DVT) of proximal vein of lower extremity ([place]) 11/06/2013  . Type 2 diabetes mellitus with stage 4 chronic kidney disease, without long-term current use of insulin ([place]) 06/11/2017  . Use of cane as ambulatory aid 04/27/2018  . Primary osteoarthritis of right knee 04/03/2019  . Anemia due to stage 4 chronic kidney disease ([place]) 06/24/2019  . Generalized weakness 06/24/2019  . Benign prostatic hyperplasia   . Seizures ([place])   . PAD (peripheral artery disease) ([place]) 10/13/2019  . Total knee replacement status 11/02/2019  . Anemia in chronic kidney disease (CODE) 06/24/2019  . 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 ([place]) 11/06/2013  . Personal history of other venous thrombosis and embolism 11/06/2013  . Anemia of chronic kidney failure, stage 3 (moderate) ([place]) 01/16/2020  . Asthenia 06/24/2019  . Body mass index (BMI)40.0-44.9, adult 05/24/2014  . History of total knee arthroplasty 11/02/2019  . Status post total right knee replacement 01/22/2020  . Dependence on other enabling machines and devices 04/27/2018  . Ankle ulcer ([place]) 05/08/2018  . Lewy body dementia without behavioral disturbance ([place]) 05/21/2020  .  Hyperlipidemia associated with type 2 diabetes mellitus ([place]) 05/21/2020  . Slurred speech 06/04/2020  . CKD (chronic kidney disease) stage 3, GFR 30-59 ml/min (HCC) 06/04/2020  . Essential hypertension 06/04/2020  . Hyperlipidemia 06/04/2020  . COPD (chronic obstructive pulmonary disease) ([place]) 07/20/2020  . Acute metabolic encephalopathy 55/73/2202   Resolved Ambulatory Problems    Diagnosis Date Noted  . Health care maintenance 12/11/2014  . Morbid (severe) obesity due to excess calories ([place]) 05/24/2014  . Nocturnal seizures ([place]) 11/06/2013  . Venous ulcer ([place]) 04/19/2017  . Venous ulcer of ankle, right ([place]) 05/08/2018  . Pneumonia due to COVID-19 virus 06/24/2019  . Respiratory failure with hypoxia ([place]) 06/24/2019  . Anemia of chronic renal failure, stage 3a ([place])   . Hyperkalemia   . Acute kidney injury superimposed on CKD ([place])   . Patellar tendon rupture, right, initial encounter 12/05/2019  . Acute renal failure syndrome ([place]) 01/16/2020  . Rupture of patellar tendon 12/05/2019  . Pneumonia due to coronavirus  disease 2019 06/24/2019  . Injury of toe 01/24/2016   Past Medical History:  Diagnosis Date  . Arthritis   . Bilateral swelling of feet   . BPH (benign prostatic hyperplasia)   . Chronic kidney disease   . DVT (deep venous thrombosis) ([place])   . History of gout   . Sleep apnea   . Tendonitis of foot    SOCIAL HX:  Social History   Tobacco Use  . Smoking status: Former Smoker    Packs/day: 1.00    Years: 2.00    Pack years: 2.00    Types: Cigarettes    Quit date: 06/23/1968    Years since quitting: 52.1  . Smokeless tobacco: Never Used  . Tobacco comment: 25 years ago quit  Substance Use Topics  . Alcohol use: No   FAMILY HX:  Family History  Problem Relation Age of Onset  . Arthritis Mother   . Hypertension Mother   . Diabetes Brother    ALLERGIES:  Allergies  Allergen Reactions  . Shellfish Allergy Anaphylaxis and Other (See Comments)     gout  . Iodinated Diagnostic Agents Other (See Comments)    Chronic kidney disease 3b  . Nsaids Other (See Comments)    Chronic kidney disease 3b  . Rivastigmine Other (See Comments)    Side effect and not a true allergy     PERTINENT MEDICATIONS:  Outpatient Encounter Medications as of 07/24/2020  Medication Sig  . albuterol (VENTOLIN HFA) 108 (90 Base) MCG/ACT inhaler Inhale 2 puffs into the lungs every 4 (four) hours as needed for wheezing.  Marland Kitchen allopurinol (ZYLOPRIM) 100 MG tablet Take 100 mg by mouth daily.  Marland Kitchen atorvastatin (LIPITOR) 20 MG tablet Take 1 tablet (20 mg total) by mouth at bedtime.  . docusate sodium (COLACE) 100 MG capsule Take 100 mg by mouth daily as needed for mild constipation or moderate constipation.   . fexofenadine (ALLEGRA) 180 MG tablet Take 180 mg by mouth daily as needed for allergies.   . fluticasone (FLONASE) 50 MCG/ACT nasal spray Place 2 sprays into both nostrils daily as needed for allergies.   . fluticasone (FLOVENT HFA) 110 MCG/ACT inhaler Inhale 1 puff into the lungs in the morning and at bedtime.  . gabapentin (NEURONTIN) 100 MG capsule Take 1 capsule (100 mg total) by mouth 2 (two) times daily. (Patient not taking: Reported on 06/29/2020)  . lamoTRIgine (LAMICTAL) 25 MG tablet Take 6 tablets (150 mg total) by mouth 2 (two) times daily Follow titration instructions given in office by your neurology.  . levETIRAcetam (KEPPRA) 500 MG tablet Take 500 mg by mouth 2 (two) times daily.   . Magnesium 250 MG TABS Take 250 mg by mouth daily.  . melatonin 5 MG TABS Take 5 mg by mouth at bedtime as needed (sleep). (Patient not taking: Reported on 06/29/2020)  . metoprolol succinate (TOPROL-XL) 50 MG 24 hr tablet Take 50 mg by mouth daily.   . Multiple Vitamin (ONE-A-DAY MENS PO) Take 1 tablet by mouth daily.   Alveda Reasons 10 MG TABS tablet Take 1 tablet (10 mg total) by mouth daily before breakfast.   No facility-administered encounter medications on file as of 07/24/2020.     Objective: ROS/family report  General: NAD EYES: denies vision changes, wears glasses ENMT: denies dysphagia Cardiovascular: denies chest pain Pulmonary: denies  cough, denies increased SOB Abdomen: endorses fair  To poor appetite, denies  constipation, endorses incontinence of bowel GU: denies dysuria, endorses incontinence of urine  MSK:  endorses extreme ROM limitations, no falls reported Skin: denies rashes or wounds- LE wounds reported as healed. Neurological: endorses weakness, denies pain, denies insomnia Psych: Endorses positive mood Heme/lymph/immuno: denies bruises, abnormal bleeding  Physical Exam: Current and past weights: 280 lbs Constitutional:  NAD General: frail appearing, obese  EYES: anicteric sclera, lids intact, no discharge  ENMT: intact hearing,oral mucous membranes moist CV: 2+ LE edema Pulmonary: no increased work of breathing, no cough, no audible wheezes, room air Abdomen: intake 25%,no ascites GU: deferred MSK: mod sarcopenia, decreased ROM in all extremities, non ambulatory Skin: warm and dry, no rashes or wounds on visible skin Neuro: Increased generalized weakness, severe cognitive impairment, UE tremors Psych: non-anxious affect, A and O x 1-2 Hem/lymph/immuno: no widespread bruising   Thank you for the opportunity to participate in the care of Mr. Presti.  The palliative care team will continue to follow. Please call our office at [phone] if we can be of additional assistance.  Jason Coop, NP , DNP, MPH, AGPCNP-BC, ACHPN   COVID-19 PATIENT SCREENING TOOL  Person answering questions: _______daughter____________   1.  Is the patient or any family member in the home showing any signs or symptoms regarding respiratory infection?                  Person with Symptom  ______________na___________ a. Fever/chills/headache                                                        Yes___ No__X_            b. Shortness of breath                                                             Yes___ No__X_           c. Cough/congestion                                               Yes___  No__X_          d. Muscle/Body aches/pains                                                   Yes___ No__X_         e. Gastrointestinal symptoms (diarrhea,nausea)             Yes___ No__X_         f. Sudden loss of smell or taste      Yes___ No__X_        2. Within the past 10 days, has anyone living in the home had any contact with someone with or under investigation for COVID-19?    Yes___ No__X__   Person __________________

## 2020-07-25 ENCOUNTER — Ambulatory Visit: Payer: Medicare Other | Admitting: Dermatology

## 2020-07-25 ENCOUNTER — Telehealth: Payer: Self-pay

## 2020-07-26 ENCOUNTER — Other Ambulatory Visit: Payer: Medicare Other | Admitting: Primary Care

## 2020-07-26 DIAGNOSIS — R569 Unspecified convulsions: Secondary | ICD-10-CM | POA: Diagnosis not present

## 2020-07-26 LAB — LEVETIRACETAM LEVEL: Levetiracetam Lvl: 31.9 ug/mL (ref 10.0–40.0)

## 2020-08-02 ENCOUNTER — Other Ambulatory Visit: Payer: Medicare Other | Admitting: Primary Care

## 2020-08-02 DIAGNOSIS — S86811D Strain of other muscle(s) and tendon(s) at lower leg level, right leg, subsequent encounter: Secondary | ICD-10-CM | POA: Diagnosis not present

## 2020-08-06 ENCOUNTER — Ambulatory Visit: Payer: Medicare Other | Admitting: Pharmacist

## 2020-08-06 ENCOUNTER — Telehealth: Payer: Self-pay

## 2020-08-06 DIAGNOSIS — I825Y9 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: Secondary | ICD-10-CM

## 2020-08-06 NOTE — Telephone Encounter (Signed)
I contacted the patient daughter and scheduled a nurse visit appt for PPD placement.

## 2020-08-06 NOTE — Patient Instructions (Signed)
Visit Information  PATIENT GOALS: Goals Addressed   Patient/caregiver to supply documentation for patient assistance programs as needed.     The patient verbalized understanding of instructions, educational materials, and care plan provided today and declined offer to receive copy of patient instructions, educational materials, and care plan.   Telephone follow up appointment with care management team member scheduled for: 09/03/2020 at 1:15 PM  Harlow Asa, PharmD, [place] [phone]

## 2020-08-06 NOTE — Chronic Care Management (AMB) (Signed)
Chronic Care Management Pharmacy Note  08/06/2020 Name:  [facility]. MRN:  [id number] DOB:  [birth year]  Subjective: Justin Shannon. is an 80 y.o. year old male who is a primary patient of [name], DO.  The CCM team was consulted for assistance with disease management and care coordination needs.    Engaged with patient's daughter by telephone for follow up visit in response to provider referral for pharmacy case management and/or care coordination services.   Consent to Services:  The patient was given information about Chronic Care Management services, agreed to services, and gave verbal consent prior to initiation of services.  Please see initial visit note for detailed documentation.   Objective:  Lab Results  Component Value Date   HGBA1C 5.9 (H) 06/05/2020       Component Value Date/Time   CHOL 96 06/05/2020 0816   TRIG 81 06/05/2020 0816   HDL 57 06/05/2020 0816   CHOLHDL 1.7 06/05/2020 0816   VLDL 16 06/05/2020 0816   LDLCALC 23 06/05/2020 0816    BP Readings from Last 3 Encounters:  07/22/20 (!) 166/99  06/28/20 (!) 153/65  06/06/20 (!) 149/57    Assessment: Review of patient past medical history, allergies, medications, health status, including review of consultants reports, laboratory and other test data, was performed as part of comprehensive evaluation and provision of chronic care management services.   SDOH:  (Social Determinants of Health) assessments and interventions performed: none   CCM Care Plan  Allergies  Allergen Reactions  . Shellfish Allergy Anaphylaxis and Other (See Comments)    gout  . Iodinated Diagnostic Agents Other (See Comments)    Chronic kidney disease 3b  . Nsaids Other (See Comments)    Chronic kidney disease 3b  . Rivastigmine Other (See Comments)    Side effect and not a true allergy    Medications Reviewed Today    Reviewed by Gardiner Barefoot, DPM (Physician) on 07/16/20 at 1117  Med List  Status: <None>  Medication Order Taking? Sig Documenting Provider Last Dose Status Informant  albuterol (VENTOLIN HFA) 108 (90 Base) MCG/ACT inhaler [id number] No Inhale 2 puffs into the lungs every 4 (four) hours as needed for wheezing. [provider] Taking Active Family Member  allopurinol (ZYLOPRIM) 100 MG tablet [id number] No Take 100 mg by mouth daily. [provider] Taking Active Family Member  atorvastatin (LIPITOR) 20 MG tablet [id number] No Take 1 tablet (20 mg total) by mouth at bedtime. [name], DO Taking Active Family Member  docusate sodium (COLACE) 100 MG capsule [id number] No Take 100 mg by mouth daily as needed for mild constipation or moderate constipation.  [provider] Taking Active Family Member  fexofenadine (ALLEGRA) 180 MG tablet [id number] No Take 180 mg by mouth daily as needed for allergies.  [provider] Taking Active Family Member  fluticasone (FLONASE) 50 MCG/ACT nasal spray [id number] No Place 2 sprays into both nostrils daily as needed for allergies.  [provider] Taking Active Family Member           Med Note Daune Perch   Fri Jun 24, 2019 11:07 PM)    fluticasone (FLOVENT HFA) 110 MCG/ACT inhaler [id number]  Inhale 1 puff into the lungs in the morning and at bedtime. Karamalegos, Devonne Doughty, DO  Active   gabapentin (NEURONTIN) 100 MG capsule [id number] No Take 1 capsule (100 mg total) by mouth 2 (two) times daily.  Patient not taking: Reported on 06/29/2020  Antonieta Pert, MD Not Taking Active   lamoTRIgine (LAMICTAL) 25 MG tablet [id number] No Take 6 tablets (150 mg total) by mouth 2 (two) times daily Follow titration instructions given in office by your neurology. Antonieta Pert, MD Taking Active   levETIRAcetam (KEPPRA) 500 MG tablet [id number] No Take 500 mg by mouth 2 (two) times daily.  [provider] Taking Active Family Member  Magnesium 250 MG TABS [id number] No Take 250 mg by mouth daily.  [provider] Taking Active Family Member  melatonin 5 MG TABS [id number] No Take 5 mg by mouth at bedtime as needed (sleep).  Patient not taking: Reported on 06/29/2020   [provider] Not Taking Active Family Member  metoprolol succinate (TOPROL-XL) 50 MG 24 hr tablet [id number] No Take 50 mg by mouth daily.  [provider] Taking Active Family Member  Multiple Vitamin (ONE-A-DAY MENS PO) [id number] No Take 1 tablet by mouth daily.  [provider] Taking Active Family Member  XARELTO 10 MG TABS tablet [id number]  Take 1 tablet (10 mg total) by mouth daily before breakfast. [name], DO  Active           Patient Active Problem List   Diagnosis Date Noted  . Acute metabolic encephalopathy 70/06/7492  . COPD (chronic obstructive pulmonary disease) ([place]) 07/20/2020  . Slurred speech 06/04/2020  . CKD (chronic kidney disease) stage 3, GFR 30-59 ml/min (HCC) 06/04/2020  . Essential hypertension 06/04/2020  . Hyperlipidemia 06/04/2020  . Lewy body dementia without behavioral disturbance ([place]) 05/21/2020  . Hyperlipidemia associated with type 2 diabetes mellitus ([place]) 05/21/2020  . Status post total right knee replacement 01/22/2020  . Anemia of chronic kidney failure, stage 3 (moderate) ([place]) 01/16/2020  . Total knee replacement status 11/02/2019  . History of total knee arthroplasty 11/02/2019  . PAD (peripheral artery disease) ([place]) 10/13/2019  . Benign prostatic hyperplasia   . Seizures ([place])   . Anemia due to stage 4 chronic kidney disease ([place]) 06/24/2019  . Generalized weakness 06/24/2019  . Anemia in chronic kidney disease (CODE) 06/24/2019  . Asthenia 06/24/2019  . Primary osteoarthritis of right knee 04/03/2019  . Ankle ulcer ([place]) 05/08/2018  . Use of cane as ambulatory aid 04/27/2018  . Dependence on other enabling machines and devices 04/27/2018  . Type 2 diabetes mellitus with stage 4 chronic kidney disease, without  long-term current use of insulin ([place]) 06/11/2017  . Lymphedema 05/04/2016  . Chronic venous insufficiency 05/04/2016  . Toe sprain 01/24/2016  . Morbid obesity with body mass index of 40.0-44.9 in adult [facility]) 05/24/2014  . Body mass index (BMI)40.0-44.9, adult 05/24/2014  . OSA on CPAP 01/10/2014  . CKD (chronic kidney disease), stage IV ([place]) 11/22/2013  . Allergic rhinitis 11/06/2013  . Benign hypertension with CKD (chronic kidney disease) stage IV ([place]) 11/06/2013  . Epilepsy ([place]) 11/06/2013  . Gout 11/06/2013  . History of DVT (deep vein thrombosis) 11/06/2013  . Chronic deep vein thrombosis (DVT) of proximal vein of lower extremity ([place]) 11/06/2013  . 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 Personal history of other venous thrombosis and embolism 11/06/2013    Conditions to be addressed/monitored: History of VTE and medication assistance  Care Plan : PharmD - Medication Assistance  Updates made by Vella Raring, [place] since 08/06/2020 12:00 AM    Problem: Disease Progression     Long-Range Goal: Disease Progression Prevented or Minimized   Start  Date: 06/18/2020  Expected End Date: 09/16/2020  Recent Progress: On track  Priority: High  Note:   Current Barriers:  . Unable to independently afford treatment regimen  Pharmacist Clinical Goal(s):  Marland Kitchen Over the next 90 days, patient will verbalize ability to afford treatment regimen through collaboration with PharmD and provider.   Interventions: . 1:1 collaboration with [name], DO regarding development and update of comprehensive plan of care as evidenced by provider attestation and co-signature . Inter-disciplinary care team collaboration (see longitudinal plan of care) . Perform chart review  Medication Assistance . Daughter reports patient denied for Extra Help through Social Security. Have counseled daughter on importance  of retaining denial letter . Symbicort inhaler o Previously collaborated with PCP to request change of patient's long-acting combination inhaler from Advair Diskus to a non-dry powder inhaler (DPI) alternative preferred through patient's health plan, such as Symbicort. Per daughter, patient finds a non-DPI form easier to use. Provider agreed and Rx for Symbicort sent to patient's pharmacy on 1/7. - Per daughter and review of chart, inhaler changed by PCP on 1/20 from Symbicort to Flovent HFA due to affordablility concern, but Flovent HFA also unaffordable as billed through pharmacy - Note per formulary for patient's plan from [employer], Symbicort and Flovent both tier 3 options through patient's plan o Today idenitfy Symbicort inhaler billed for incorrect day supply by [employer]. Collaborate with Butch Penny at [facility] to resolve Symbicort billing issue.  - Symbicort now correctly billed through patient's plan for tier 3 copayment. o Follow up with patient's daughter today to let her know Symbicort copayment. Daughter reports patient currently using up supply of Advair inhaler.  o Daughter states will follow up with Pulmonogist at upcoming appointment on 2/16 and, if to be continued, will pick up and patient start Symbicort inhaler at that time. o Based on reported income for patient, patient eligible to apply for assistance for Symbicort from [place]. Will plan to send daughter/patient application following next telephone appointment if patient continuing on inhaler at that time. Alveda Reasons o Review with daughter again the criteria for applying for patient assistance for Xarelto, including program 4% out of pocket expenditure requirement o Daughter confirms will follow up with CM Pharmacist when close to meeting out of pocket expenditure requirement for 2022  Patient Goals/Self-Care Activities . Over the next 90 days, patient will:  - collaborate with  provider on medication access solutions - attend medical appointments as scheduled . Next appointment with Neurology on 2/15 . Next appointment with Pulmonology on 2/16 . Next appointment with Palliative Care on 2/24 . Next appointment with PCP on 3/2  Follow Up Plan: Telephone follow up appointment with care management team member scheduled for: 09/03/2020 at 1:15 PM      Follow Up: Caregiver agrees to Care Plan and Follow-up.  Harlow Asa, PharmD, [place] [phone]

## 2020-08-07 DIAGNOSIS — E538 Deficiency of other specified B group vitamins: Secondary | ICD-10-CM | POA: Diagnosis not present

## 2020-08-07 DIAGNOSIS — E519 Thiamine deficiency, unspecified: Secondary | ICD-10-CM | POA: Diagnosis not present

## 2020-08-08 ENCOUNTER — Ambulatory Visit: Payer: Medicare Other | Admitting: Pulmonary Disease

## 2020-08-08 ENCOUNTER — Other Ambulatory Visit: Payer: Self-pay

## 2020-08-08 ENCOUNTER — Encounter: Payer: Self-pay | Admitting: Pulmonary Disease

## 2020-08-08 VITALS — BP 122/58 | HR 65 | Temp 98.6°F | Ht 72.0 in | Wt 297.0 lb

## 2020-08-08 DIAGNOSIS — I10 Essential (primary) hypertension: Secondary | ICD-10-CM | POA: Diagnosis not present

## 2020-08-08 DIAGNOSIS — G4733 Obstructive sleep apnea (adult) (pediatric): Secondary | ICD-10-CM

## 2020-08-08 DIAGNOSIS — R1310 Dysphagia, unspecified: Secondary | ICD-10-CM

## 2020-08-08 DIAGNOSIS — E519 Thiamine deficiency, unspecified: Secondary | ICD-10-CM | POA: Diagnosis not present

## 2020-08-08 DIAGNOSIS — Z9989 Dependence on other enabling machines and devices: Secondary | ICD-10-CM | POA: Diagnosis not present

## 2020-08-08 DIAGNOSIS — R059 Cough, unspecified: Secondary | ICD-10-CM

## 2020-08-08 DIAGNOSIS — E538 Deficiency of other specified B group vitamins: Secondary | ICD-10-CM | POA: Diagnosis not present

## 2020-08-08 DIAGNOSIS — R4702 Dysphasia: Secondary | ICD-10-CM

## 2020-08-08 NOTE — Patient Instructions (Addendum)
We are going to get a swallowing test  We are going to get an oxygen test on your CPAP  You do not need to use Symbicort   We will see him in follow-up in 4 to 6 weeks time call sooner should any problems arise.

## 2020-08-08 NOTE — Progress Notes (Signed)
Subjective:    [name], male    DOB: 03-22-1941, 80 y.o.   MRN: [id number]  HPI Chief Complaint  Patient presents with  . pulmonary consult    Per Dr. Karamalegos--c/o sob with exertion, prod cough with clear sputum and wheezing. Covid 06/2018   HPI Patient is a 80 year old remote former smoker who is kindly referred by Dr. Parks Ranger for the issue of shortness of breath on exertion.  He also has issues with productive cough of clear sputum and wheezing.  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  He does have issues with dysphagia.  He is [facility] by neurology at [facility].  He presents today with children present.  They are very engaged in his care.  He does have sleep apnea and uses CPAP nightly and appears to do well with it.  He has multiple issues as noted below.  There has been no fever, chills or sweats noted.  No other issues.  Family is considering palliative care there.  He has had some seizure-like activity for which he is on Keppra for.  He is not on ACE inhibitor's.  Notice any significant impact on Symbicort.   Review of Systems A 10 point review of systems was performed and it is as noted above otherwise negative.  Review of systems is limited and mostly provided by patient's children.   Past Medical History:  Diagnosis Date  . Arthritis   . Bilateral swelling of feet   . BPH (benign prostatic hyperplasia)   . Chronic kidney disease   . DVT (deep venous thrombosis) (HCC)    right lower extremity  . History of gout   . Seizures ([place])   . Sleep apnea   . Tendonitis of foot    left   Past Surgical History:  Procedure Laterality Date  . APPENDECTOMY    . APPLICATION OF WOUND VAC Right 11/02/2019   Procedure: APPLICATION OF WOUND VAC;  Surgeon: Dereck Leep, MD;  Location: [facility] ORS;  Service: Orthopedics;  Laterality: Right;  [id number]  . HERNIA REPAIR     UMBILICAL  . IVC  FILTER INSERTION N/A 10/25/2019   Procedure: IVC FILTER INSERTION;  Surgeon: Katha Cabal, MD;  Location: [facility];  Service: Cardiovascular;  Laterality: N/A;  . KNEE ARTHROPLASTY Right 11/02/2019   Procedure: COMPUTER ASSISTED TOTAL KNEE ARTHROPLASTY;  Surgeon: Dereck Leep, MD;  Location: [facility] ORS;  Service: Orthopedics;  Laterality: Right;  . PATELLAR TENDON REPAIR Right 12/05/2019   Procedure: PATELLA TENDON REPAIR;  Surgeon: Dereck Leep, MD;  Location: [facility] ORS;  Service: Orthopedics;  Laterality: Right;  . PROSTATE SURGERY     Patient Active Problem List   Diagnosis Date Noted  . Acute metabolic encephalopathy 79/89/2119  . COPD (chronic obstructive pulmonary disease) ([place]) 07/20/2020  . Slurred speech 06/04/2020  . CKD (chronic kidney disease) stage 3, GFR 30-59 ml/min (HCC) 06/04/2020  . Essential hypertension 06/04/2020  . Hyperlipidemia 06/04/2020  . Lewy body dementia without behavioral disturbance ([place]) 05/21/2020  . Hyperlipidemia associated with type 2 diabetes mellitus ([place]) 05/21/2020  . Status post total right knee replacement 01/22/2020  . Anemia of chronic kidney failure, stage 3 (moderate) ([place]) 01/16/2020  . Total knee replacement status 11/02/2019  . History of total knee arthroplasty 11/02/2019  . PAD (peripheral artery disease) ([place]) 10/13/2019  . Benign prostatic hyperplasia   . Seizures ([place])   . Anemia due to  stage 4 chronic kidney disease ([place]) 06/24/2019  . Generalized weakness 06/24/2019  . Anemia in chronic kidney disease ([place]) 06/24/2019  . Asthenia 06/24/2019  . Primary osteoarthritis of right knee 04/03/2019  . Ankle ulcer ([place]) 05/08/2018  . Use of cane as ambulatory aid 04/27/2018  . Dependence on other enabling machines and devices 04/27/2018  . Type 2 diabetes mellitus with stage 4 chronic kidney disease, without long-term current use of insulin ([place]) 06/11/2017  . Lymphedema 05/04/2016  . Chronic venous insufficiency  05/04/2016  . Toe sprain 01/24/2016  . Morbid obesity with body mass index of 40.0-44.9 in adult [facility]) 05/24/2014  . Body mass index (BMI)40.0-44.9, adult 05/24/2014  . OSA on CPAP 01/10/2014  . CKD (chronic kidney disease), stage IV ([place]) 11/22/2013  . Allergic rhinitis 11/06/2013  . Benign hypertension with CKD (chronic kidney disease) stage IV ([place]) 11/06/2013  . Epilepsy ([place]) 11/06/2013  . Gout 11/06/2013  . History of DVT (deep vein thrombosis) 11/06/2013  . Chronic deep vein thrombosis (DVT) of proximal vein of lower extremity ([place]) 11/06/2013  . 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 Personal history of other venous thrombosis and embolism 11/06/2013   Family History  Problem Relation Age of Onset  . Arthritis Mother   . Hypertension Mother   . Diabetes Brother    Social History   Tobacco Use  . Smoking status: Former Smoker    Packs/day: 1.00    Years: 10.00    Pack years: 10.00    Types: Cigarettes    Quit date: 06/23/1968    Years since quitting: 52.1  . Smokeless tobacco: Never Used  . Tobacco comment: 25 years ago quit  Substance Use Topics  . Alcohol use: No   Allergies  Allergen Reactions  . Shellfish Allergy Anaphylaxis and Other (See Comments)    gout  . Iodinated Diagnostic Agents Other (See Comments)    Chronic kidney disease 3b  . Nsaids Other (See Comments)    Chronic kidney disease 3b  . Rivastigmine Other (See Comments)    Side effect and not a true allergy   Current Meds  Medication Sig  . albuterol (VENTOLIN HFA) 108 (90 Base) MCG/ACT inhaler Inhale 2 puffs into the lungs every 4 (four) hours as needed for wheezing.  Marland Kitchen allopurinol (ZYLOPRIM) 100 MG tablet Take 100 mg by mouth daily.  Marland Kitchen atorvastatin (LIPITOR) 20 MG tablet Take 1 tablet (20 mg total) by mouth at bedtime.  . docusate sodium (COLACE) 100 MG capsule Take 100 mg by mouth daily as needed for mild  constipation or moderate constipation.   . fexofenadine (ALLEGRA) 180 MG tablet Take 180 mg by mouth daily as needed for allergies.   . fluticasone (FLONASE) 50 MCG/ACT nasal spray Place 2 sprays into both nostrils daily as needed for allergies.   Marland Kitchen lamoTRIgine (LAMICTAL) 25 MG tablet Take 6 tablets (150 mg total) by mouth 2 (two) times daily Follow titration instructions given in office by your neurology.  . levETIRAcetam (KEPPRA) 500 MG tablet Take 500 mg by mouth 2 (two) times daily.   . Magnesium 250 MG TABS Take 250 mg by mouth daily.  . melatonin 5 MG TABS Take 5 mg by mouth at bedtime as needed (sleep).  . metoprolol succinate (TOPROL-XL) 50 MG 24 hr tablet Take 50 mg by mouth daily.   . Multiple Vitamin (ONE-A-DAY MENS PO) Take 1 tablet by mouth daily.   Alveda Reasons  10 MG TABS tablet Take 1 tablet (10 mg total) by mouth daily before breakfast.   Immunization History  Administered Date(s) Administered  . Influenza Inj Mdck Quad Pf 05/03/2019  . Influenza Split 05/24/2014  . Influenza,inj,Quad PF,6+ Mos 03/13/2020  . Influenza-Unspecified 03/31/2017, 05/03/2019  . PFIZER(Purple Top)SARS-COV-2 Vaccination 08/08/2019, 08/30/2019, 04/09/2020      Objective:   Physical Exam BP (!) 122/58 (BP Location: Left Arm, Cuff Size: Normal)   Pulse 65   Temp 98.6 F (37 C) (Temporal)   Ht 6' (1.829 m)   Wt 297 lb (134.7 kg) Comment: weight is per patient  SpO2 98%   BMI 40.28 kg/m  GENERAL: Obese gentleman, no acute distress, presents in transport chair.  Occasional throat clearing. HEAD: Normocephalic, atraumatic.  EYES: Pupils equal, round, reactive to light.  No scleral icterus.  MOUTH: Nose/mouth/throat not examined due to masking requirements for COVID 19. NECK: Supple. No thyromegaly. Trachea midline. No JVD.  No adenopathy. PULMONARY: Good air entry bilaterally.  Coarse, no rhonchi otherwise no adventitious sounds. CARDIOVASCULAR: S1 and S2. Regular rate and rhythm.  ABDOMEN: Obese,  otherwise benign. MUSCULOSKELETAL: No joint deformity, no clubbing, no edema.  NEUROLOGIC: No overt focal deficit, cannot assess gait, in transport chair. SKIN: Intact,warm,dry. PSYCH: Flat affect, no psychomotor retardation noted.       Assessment & Plan:     ICD-10-CM   1. Dysphagia, unspecified type  R13.10 SLP modified barium swallow    DG OP Swallowing Func-Medicare/Speech Path   Evaluate with modified barium swallow  2. Cough  R05.9    Suspect related to dysphagia Query chronic aspiration  3. OSA on CPAP  G47.33 Pulse oximetry, overnight   Z99.89    Evaluate need for O2 ONOX on CPAP   Orders Placed This Encounter  Procedures  . DG OP Swallowing Func-Medicare/Speech Path    Standing Status:   Future    Number of Occurrences:   1    Standing Expiration Date:   08/08/2021    Order Specific Question:   Reason for Exam (SYMPTOM  OR DIAGNOSIS REQUIRED)    Answer:   Dysphagia    Order Specific Question:   Where should this test be performed?    Answer:   [facility])  . Pulse oximetry, overnight    ONO on cpap  WUX:[id number]    Standing Status:   Future    Standing Expiration Date:   08/08/2021  . SLP modified barium swallow    Standing Status:   Future    Standing Expiration Date:   08/08/2021    Order Specific Question:   Where should this test be performed:    Answer:   Other    Comments:   [facility]   Likely related to issues with dysphagia which are common with Parkinson's disease.  It appears that family is interested in pursuing palliative care.  It is difficult to evaluate dyspnea on exertion as he cannot exert and is pretty much sedentary.  He does not appear to be limited by this according to the children.  I would recommend discontinuing Symbicort as it does not appear to be impacting in his symptoms.  Will see him in follow-up in 4 to 6 weeks time he is to contact us prior to that time should any new difficulties arise.   Renold Don, MD [place]  PCCM   *This note was dictated using voice recognition software/Dragon.  Despite best efforts to proofread, errors can occur which can change  the meaning.  Any change was purely unintentional.

## 2020-08-09 ENCOUNTER — Other Ambulatory Visit: Payer: Self-pay

## 2020-08-09 DIAGNOSIS — Z86718 Personal history of other venous thrombosis and embolism: Secondary | ICD-10-CM

## 2020-08-09 MED ORDER — XARELTO 10 MG PO TABS: 10.0000 mg | ORAL_TABLET | Freq: Every day | ORAL | 5 refills | Status: DC

## 2020-08-14 DIAGNOSIS — G4730 Sleep apnea, unspecified: Secondary | ICD-10-CM | POA: Diagnosis not present

## 2020-08-14 DIAGNOSIS — M7582 Other shoulder lesions, left shoulder: Secondary | ICD-10-CM | POA: Diagnosis not present

## 2020-08-14 DIAGNOSIS — M7580 Other shoulder lesions, unspecified shoulder: Secondary | ICD-10-CM | POA: Diagnosis not present

## 2020-08-14 DIAGNOSIS — I890 Lymphedema, not elsewhere classified: Secondary | ICD-10-CM | POA: Diagnosis not present

## 2020-08-14 DIAGNOSIS — S86811D Strain of other muscle(s) and tendon(s) at lower leg level, right leg, subsequent encounter: Secondary | ICD-10-CM | POA: Diagnosis not present

## 2020-08-14 DIAGNOSIS — R0683 Snoring: Secondary | ICD-10-CM | POA: Diagnosis not present

## 2020-08-14 DIAGNOSIS — M19122 Post-traumatic osteoarthritis, left elbow: Secondary | ICD-10-CM | POA: Diagnosis not present

## 2020-08-16 ENCOUNTER — Ambulatory Visit: Payer: Self-pay | Admitting: General Practice

## 2020-08-16 ENCOUNTER — Other Ambulatory Visit: Payer: Medicare Other | Admitting: Primary Care

## 2020-08-16 ENCOUNTER — Telehealth: Payer: Self-pay | Admitting: General Practice

## 2020-08-16 DIAGNOSIS — G3183 Dementia with Lewy bodies: Secondary | ICD-10-CM | POA: Diagnosis not present

## 2020-08-16 DIAGNOSIS — I129 Hypertensive chronic kidney disease with stage 1 through stage 4 chronic kidney disease, or unspecified chronic kidney disease: Secondary | ICD-10-CM

## 2020-08-16 DIAGNOSIS — F0280 Dementia in other diseases classified elsewhere without behavioral disturbance: Secondary | ICD-10-CM

## 2020-08-16 DIAGNOSIS — N184 Chronic kidney disease, stage 4 (severe): Secondary | ICD-10-CM

## 2020-08-16 DIAGNOSIS — E1122 Type 2 diabetes mellitus with diabetic chronic kidney disease: Secondary | ICD-10-CM

## 2020-08-16 DIAGNOSIS — R531 Weakness: Secondary | ICD-10-CM

## 2020-08-16 NOTE — Patient Instructions (Signed)
Visit Information  PATIENT GOALS: Patient Care Plan: General Social Work (Adult)    Problem Identified: Caregiver Stress     Long-Range Goal: Caregiver Coping Optimized   Start Date: 06/14/2020  Priority: Medium  Note:    Timeframe:  Long-Range Goal Priority:  Medium Start Date:  06/14/20, goal revisited on 07/17/20                        Expected End Date: 09/12/20                    Follow Up Date- 45 days from 07/17/20   - avoid busy places that are confusing such as the shopping malls - check hot water heater temperature, turn down if too hot - get rid of poisonous plants - keep medicine where it is safe or locked - keep power tools, knives and cleaning products in a safe place - put deadbolts either high or low on outside doors - put car keys out of sight - remove knobs from stove and oven - remove vitamins, medicine, sugar substitutes and seasonings from the kitchen table and counters - supervise the use of tobacco and alcohol - use a GPS device in the car - use an audio or video monitor - use appliances that have an auto shut-off feature - use motion-sensor alarms outdoors, in the bedroom and the kitchen - use seat cushions, floor mats and bed pads that are wired to alarm when getting up    Why is this important?    Safety is important when you or your loved one has dementia.   Eyesight, hearing and changes in feelings of hot and cold or depth-perception may occur.   Wandering or getting lost may happen.   You/your loved one may have to stop driving.   Taking steps to improve safety can prevent injuries.   It will also help you/your loved one feel more relaxed.   It will help you/your loved one be independent for a longer time.      Timeframe:  Long-Range Goal Priority:  Medium Start Date:  06/14/20, goal revisited on 07/24/20                        Expected End Date: 09/12/20                    Follow Up Date- 45 days from 07/24/20   - avoid busy places that  are confusing such as the shopping malls - check hot water heater temperature, turn down if too hot - get rid of poisonous plants - keep medicine where it is safe or locked - keep power tools, knives and cleaning products in a safe place - put deadbolts either high or low on outside doors - put car keys out of sight - remove knobs from stove and oven - remove vitamins, medicine, sugar substitutes and seasonings from the kitchen table and counters - supervise the use of tobacco and alcohol - use a GPS device in the car - use an audio or video monitor - use appliances that have an auto shut-off feature - use motion-sensor alarms outdoors, in the bedroom and the kitchen - use seat cushions, floor mats and bed pads that are wired to alarm when getting up    Why is this important?    Safety is important when you or your loved one has dementia.   Eyesight, hearing and  changes in feelings of hot and cold or depth-perception may occur.   Wandering or getting lost may happen.   You/your loved one may have to stop driving.   Taking steps to improve safety can prevent injuries.   It will also help you/your loved one feel more relaxed.   It will help you/your loved one be independent for a longer time.     Assessment, press & current barriers:  Patient unable to consistently perform activities of daily living and needs additional assistance and support in order to meet this unmet need . Limited social support, ADL IADL limitations, Social Isolation, Limited access to caregiver, Cognitive Deficits, Memory Deficits, Inability to perform ADL's independently, and Inability to perform IADL's independently . Unable to self administer medications as prescribed . Lacks social connections  Clinical Goals:   Over the next 120 days, patient/caregiver will work with SW to address concerns related to care coordination needs, lack of a stable support network and lack of Development worker, international aid. LCSW will assist patient in gaining additional support in order to maintain health and mental health appropriately   Over the next 120 days, patient will demonstrate improved adherence to self care as evidenced by implementing healthy self-care into his daily routine such as: attending all medical appointments, deep breathing exercises, taking time for self-reflection, taking medications as prescribed, drinking water and daily exercise to improve mobility and mood.   Over the next 120 days, patient will demonstrate improved health management independence as evidenced by implementing healthy self-care skills and positive support/resource implementation into their daily routine to help cope with stressors and improve overall health and well-being   Over the next 120 days, patient or caregiver will verbalize basic understanding of depression/stress process and self health management plan as evidenced by his participation in development of long term plan of care and institution of self health management strategies  Interventions : . Assessed needs, level of care concerns, basic eligibility and provided education on available PCS resources within their area. . Daughter reports that Palliative Care completed a home visit today and they were informed that patient is not eligible for Hospice.  . Patient has Lewy Body Dementia and is in need of a higher level of care/support. Family prefers in home support over LTC placement at this time. Reviewed community support options ( CAP, private pay, PACE program) . Will collaborate with primary care provider when needed . Daughter reports that they are looking into getting a foyer lift because patient is unable to use his left side of the body and is unable to ambulate out of the bed. . Daughter reports that family applied for Special Assistance Medicaid but several facilities with memory care that they were looking into were only able to accept  patients under private pay for 3 years until they would accept him for Medicaid. UPDATE- Daughter is going to apply for long term care Medicaid (for skilled nursing) today on 07/24/20. Daughter was advised to contact DSS to advise them to disregard the Special Assistance Medicaid application but to consider the LTC application. Daughter will contact DSS today. Daughter has been talking with the admissions coordinator at [facility] regarding their long term care program.  . [facility] ended because patient was not making progress with therapy.  . Email sent again to daughter on 07/24/20 with personal care service resources and long term care facilities within her area.  . Patient interviewed and appropriate assessments performed . Referred patient to community resources care guide  team for assistance with in home support through [place] . Care Guide referral placed in the past but they were unable to provide any further community resource assistance.  . Provided mental health counseling with regard to caregiver strain . Discussed plans with patient for ongoing care management follow up and provided patient with direct contact information for care management team . Assisted patient/caregiver with obtaining information about health plan benefits . Provided education and assistance to client regarding Advanced Directives. . Provided education to patient/caregiver regarding level of care options. . Provided education to patient/caregiver about Hospice and/or Palliative Care services . 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 Marland Kitchen Patient completed neurologist appointment on 06/14/20. Marland Kitchen CCM LCSW educated family on LTC placement. Patient is no longer needing memory care but LTC skilled nursing.  . Other interventions provided: Solution-Focused Strategies   Follow Up Plan:  . SW will follow up with patient by phone over the next quarter    Task: Recognize and Manage Caregiver Stress   Note:    Care Management Activities:    - active listening utilized - caregiver stress acknowledged - complementary therapy use encouraged - consideration of in-home help encouraged - decision-making supported - engagement with primary care provider encouraged - healthy lifestyle promoted - mental health treatment facilitated - participation in support group encouraged - positive reinforcement provided - self-reflection promoted - social and community activities encouraged - spiritual activity use promoted - support group information provided - verbalization of feelings encouraged    Notes:    Patient Care Plan: RNCM: Lewey Body Dementia (Adult)    Problem Identified: RNCM: Management of Lewey Body Dementia   Priority: High    Long-Range Goal: RNCM: Behavior Symptoms Management   Expected End Date: 10/12/2020  Priority: High  Note:   CARE PLAN ENTRY (see longtitudinal plan of care for additional care plan information)  Current Barriers:  . Chronic Disease Management support, education, and care coordination needs related to Dementia  Clinical Goal(s) related to Dementia: Lewey Body Dementia Over the next 120 days, patient will:  . Work with the care management team to address educational, disease management, and care coordination needs  . Begin or continue self health monitoring activities as directed today  support and education for patient and caregiver with diagnosis of Lewey body dementia and decline in health . Call provider office for new or worsened signs and symptoms New or worsened symptom related to dementia and changes in conditions . Call care management team with questions or concerns . Verbalize basic understanding of patient centered plan of care established today  Interventions related to Dementia:  . Evaluation of current treatment plans and patient's adherence to plan as established by provider.  06-14-2020: The patient saw pcp on 06-11-2020. Referral for CCM  team support. The patient has been living with the daughter and her family since October. His wife is actively taking radiation treatments at this time and due to decline in the patients health the daughter has moved the patient in with her and her family. The patient saw neurologist today and further testing will be done by an in-home study and a sub referral for additional diagnosis and treatment options. 07-19-2020: The patient has had a further decline in dementia and is aggressive at times. He pulls his diaper off and is wetting the bed. His daughter has ask the other provider about getting a hoyer lift as she has hurt herself when trying to lift the patient. The  patient has become totally dependent on his daughter for feeding, bathroom needs and all care. The daughter is open for recommendations as home health stopped coming as of yesterday due to the patient not progressing in his care. 08-16-2020: The patient continues to decline. He is falling more, he is not remembering his family, having issues with swallowing. The daughter is experiencing care giver burnout. She is trying to keep him in her home but it is getting harder each day. She is overwhelmed and does not know what to do to help the patient. Empathetic listening and support. Call to LCSW to assist with collaboration and help for the patient and patients caregiver. LCSW will email additional resources and also move appointment up to call the patients daughter sooner.  . Assessed patient understanding of disease states.  The daughter verbalized the patient does not fully understand what is going on but goes to appointments, eats well, takes medications, and is working with OT/PT in the home. An aide come 2 times a week and bathes the patient.  07-19-2020: The patient has had further decline in his condition. She called memory care places but they have recommended skilled nursing. Will collaborate with LCSW and pcp. 08-16-2020: The patient continues to  decline. The daughter has talked to several agencies and there is a waiting list for 6 months or greater. Increased fall risk as the patient continues to fall out of the bed and out of his wheelchair. Advised the daughter if the patient has a fall again to have him evaluated and ask for physical therapy referral.  . Assessed patient's education and care coordination needs.  The daughter spoke to the LCSW earlier today and care guide referral made. RNCM reviewed the role of the CCM team and support of the CCM team in the help of the patient and caregiver meeting the healthy and wellness needs of the patient. 1--27-2022: Provided information to the patient daughter about [facility] helping with incontinence supplies. Also sent images of washable pads to the daughter to consider to help with patient and having incontinence episodes. The daughter is open to recommendations for help as she only has her husband helping her and he is a truck driver and not always home. 08-16-2020: The daughter is seeking any help that she can get to get the help the patient needs.  She is concerned over her own health and safety. She is appreciative of the CCM team and support. She is willing to do what she can to help the patient get the care he needs.  . Provided disease specific education to patient.  Education given on resources available and how to contact the RNCM. Education on follow up calls and reaching out to the [facility] for new concerns before next outreach. 07-19-2020: Additional information provided to discuss with [facility] at upcoming visit the hospice benefit and instructed the patients daughter to write down questions to ask about recommendations. Will discuss with the pcp about a new FL2.  The patients daughter is aware that the pcp will be out of the office until 08-01-2020. 08-16-2020: The daughter is taking the patient to several upcoming appointments. The patient has a swallowing evaluation next week, sees pcp for  paperwork and TB testing. Additional information provided today via email for long term placement facilities.  Nash Dimmer with appropriate clinical care team members regarding patient needs.  The patient is currently working with the LCSW. Will refer the pharmacist for help with medication cost constraints. 07-19-2020: Co-collaboration with the LCSW  on resources for LTC facility information and help. The LCSW plans to reach out to the patients daughter next week also. 08-16-2020: Daughter tearful on the phone with the [facility]. Ask for suggestions to respite care.  Co-collaboration by phone with the LCSW.  . - action plan for worsening symptoms mutually developed . - Financial risk analyst provided . - symptom review completed . -safety reviewed- the family uses baby monitors and knows when the patient attempts to get out of bed . -medication safety reviewed- the daughter given the patient his medications and manages his medications.   Patient Self Care Activities related to Dementia:  . Patient is unable to independently self-manage chronic health conditions  09-13-2020 at 1:45 pm   Task: RNCM: Develop Strategies to Manage Behavior   Note:   Care Management Activities:    - action plan for worsening symptoms mutually developed - community resource information provided - symptom review completed    Notes: The patient is going to have further testing to evaluate dementia- saw neurologist today   Problem Identified: Caregiver Stress     Problem Identified: Harm or Injury     Patient Care Plan: RNCM: Management of Diabetes Type 2 (Adult)    Problem Identified: RNCM: Glycemic Management (Diabetes, Type 2)   Priority: Medium    Goal: RNCM: Management of DM   Priority: Medium  Note:   Objective:  Lab Results  Component Value Date   HGBA1C 5.9 (H) 06/05/2020 .   Lab Results  Component Value Date   CREATININE 1.94 (H) 06/06/2020   CREATININE 1.86 (H) 06/06/2020   CREATININE  1.79 (H) 06/04/2020 .   Marland Kitchen No results found for: EGFR Current Barriers:  Marland Kitchen Knowledge Deficits related to basic Diabetes pathophysiology and self care/management . Knowledge Deficits related to medications used for management of diabetes . Limited Social Support . Unable to independently manage DM as evidence of advancing dementia and inability to manage care without the help of his daughter . Unable to self administer medications as prescribed . Unable to perform ADLs independently . Unable to perform IADLs independently Case Manager Clinical Goal(s):  Marland Kitchen Over the next 120 days, patient will demonstrate improved adherence to prescribed treatment plan for diabetes self care/management as evidenced by:  . daily monitoring and recording of CBG  . adherence to ADA/ carb modified diet . adherence to prescribed medication regimen Interventions:  . Provided education to patient about basic DM disease process . Reviewed medications with patient and discussed importance of medication adherence . Discussed plans with patient for ongoing care management follow up and provided patient with direct contact information for care management team . Provided patient with written educational materials related to hypo and hyperglycemia and importance of correct treatment . Advised patient, providing education and rationale, to check cbg daily and record, calling pcp for findings outside established parameters.   . Review of patient status, including review of consultants reports, relevant laboratory and other test results, and medications completed. . - A1C testing facilitated . - barriers to adherence to treatment plan identified . - blood glucose readings reviewed . - resources required to improve adherence to care identified . - self-awareness of signs/symptoms of hypo or hyperglycemia encouraged Patient Goals/Self-Care Activities . Over the next 120 days, patient will:  - UNABLE to independently manage  DM Self administers oral medications as prescribed Attends all scheduled provider appointments Checks blood sugars as prescribed and utilize hyper and hypoglycemia protocol as needed Adheres to prescribed ADA/carb modified  Follow Up Plan: Telephone follow up appointment with care management team member scheduled for: 09-13-2020 at 1:45 pm   Task: RNCM: Alleviate Barriers to Glycemic Management   Note:   Care Management Activities:    - A1C testing facilitated - barriers to adherence to treatment plan identified - blood glucose readings reviewed - resources required to improve adherence to care identified - self-awareness of signs/symptoms of hypo or hyperglycemia encouraged      Patient Care Plan: RNCM:Hypertension (Adult)    Problem Identified: Hypertension (Hypertension)     Goal: RNCM: Management of HTN   Expected End Date: 10/12/2020  Priority: Medium  Note:   Objective:  . Last practice recorded BP readings:  . BP Readings from Last 3 Encounters: .  08/08/20 . (!) 122/58 .  07/22/20 . (!) 166/99 .  06/28/20 . (!) 153/65 .    Marland Kitchen Most recent eGFR/CrCl: No results found for: EGFR  No components found for: CRCL Current Barriers:  Marland Kitchen Knowledge Deficits related to basic understanding of hypertension pathophysiology and self care management . Knowledge Deficits related to understanding of medications prescribed for management of hypertension . 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 Unable to independently HTN as evidence of decline in health and having to move in with daughter to assist with care.  Leodis [place] social connections . Unable to perform ADLs independently . Unable to perform IADLs independently Case Manager Clinical Goal(s):  Marland Kitchen Over the next 120 days, patient will verbalize understanding of plan for hypertension management . Over the next 120 days, patient will demonstrate improved adherence to prescribed treatment plan for  hypertension as evidenced by taking all medications as prescribed, monitoring and recording blood pressure as directed, adhering to low sodium/DASH diet . Over the next 120 days, patient will demonstrate improved health management independence as evidenced by checking blood pressure as directed and notifying PCP if SBP>160 or DBP > 90, taking all medications as prescribe, and adhering to a low sodium diet as discussed. Interventions:  Marland Kitchen UNABLE to independently:manage health and wellness due to decline in memory and recent moving in with his daughter to assist with his care . Evaluation of current treatment plan related to hypertension self management and patient's adherence to plan as established by provider. . Provided education to patient re: stroke prevention, s/s of heart attack and stroke, DASH diet, complications of uncontrolled blood pressure . Reviewed medications with patient and discussed importance of compliance. Pharmacist referral for questions and financial help for Xarelto  . Discussed plans with patient for ongoing care management follow up and provided patient with direct contact information for care management team . Review of dietary habits. The patient is having to be feed now as he can not see to feed himself. The daughter is having a hard time with caring for the patient.  . Advised patient, providing education and rationale, to monitor blood pressure daily and record, calling PCP for findings outside established parameters.  . - blood pressure trends reviewed . - depression screen reviewed . - home or ambulatory blood pressure monitoring encouraged Patient Goals/Self-Care Activities . Over the next 120 days, patient will:  - Self administers medications as prescribed Checks BP and records as discussed Follows a low sodium diet/DASH diet Follow Up Plan: Telephone follow up appointment with care management team member scheduled for: 09-13-2020 at 1:45 pm   Task: RNCM: Identify  and Monitor Blood Pressure Elevation   Note:   Care Management Activities:    -  blood pressure trends reviewed - depression screen reviewed - home or ambulatory blood pressure monitoring encouraged      Patient Care Plan: PharmD - Medication Assistance    Problem Identified: Disease Progression     Long-Range Goal: Disease Progression Prevented or Minimized   Start Date: 06/18/2020  Expected End Date: 09/16/2020  Recent Progress: On track  Priority: High  Note:   Current Barriers:  . Unable to independently afford treatment regimen  Pharmacist Clinical Goal(s):  Marland Kitchen Over the next 90 days, patient will verbalize ability to afford treatment regimen through collaboration with PharmD and provider.   Interventions: . 1:1 collaboration with [name], DO regarding development and update of comprehensive plan of care as evidenced by provider attestation and co-signature . Inter-disciplinary care team collaboration (see longitudinal plan of care) . Perform chart review  Medication Assistance . Daughter reports patient denied for Extra Help through Social Security. Have counseled daughter on importance of retaining denial letter . Symbicort inhaler o Previously collaborated with PCP to request change of patient's long-acting combination inhaler from Advair Diskus to a non-dry powder inhaler (DPI) alternative preferred through patient's health plan, such as Symbicort. Per daughter, patient finds a non-DPI form easier to use. Provider agreed and Rx for Symbicort sent to patient's pharmacy on 1/7. - Per daughter and review of chart, inhaler changed by PCP on 1/20 from Symbicort to Flovent HFA due to affordablility concern, but Flovent HFA also unaffordable as billed through pharmacy - Note per formulary for patient's plan from [employer], Symbicort and Flovent both tier 3 options through patient's plan o Today idenitfy Symbicort inhaler billed for incorrect day  supply by [employer]. Collaborate with Butch Penny at [place] to resolve Symbicort billing issue.  - Symbicort now correctly billed through patient's plan for tier 3 copayment. o Follow up with patient's daughter today to let her know Symbicort copayment. Daughter reports patient currently using up supply of Advair inhaler.  o Daughter states will follow up with Pulmonogist at upcoming appointment on 2/16 and, if to be continued, will pick up and patient start Symbicort inhaler at that time. o Based on reported income for patient, patient eligible to apply for assistance for Symbicort from [place]. Will plan to send daughter/patient application following next telephone appointment if patient continuing on inhaler at that time. Alveda Reasons o Review with daughter again the criteria for applying for patient assistance for Xarelto, including program 4% out of pocket expenditure requirement o Daughter confirms will follow up with CM Pharmacist when close to meeting out of pocket expenditure requirement for 2022  Patient Goals/Self-Care Activities . Over the next 90 days, patient will:  - collaborate with provider on medication access solutions - attend medical appointments as scheduled . Next appointment with Neurology on 2/15 . Next appointment with Pulmonology on 2/16 . Next appointment with Palliative Care on 2/24 . Next appointment with PCP on 3/2  Follow Up Plan: Telephone follow up appointment with care management team member scheduled for: 09/03/2020 at 1:15 PM     Patient verbalizes understanding of instructions provided today and agrees to view in [place].   Telephone follow up appointment with care management team member scheduled for: 09-13-2020 at 1:45pm  Noreene Larsson RN, MSN, [place] Mobile: [phone]

## 2020-08-16 NOTE — Chronic Care Management (AMB) (Signed)
Chronic Care Management   CCM RN Visit Note  08/16/2020 Name: [facility]. MRN: [id number] DOB: 1941/04/03  Subjective: Justin Borg. is a 80 y.o. year old male who is a primary care patient of [name], DO. The care management team was consulted for assistance with disease management and care coordination needs.    Engaged with patient by telephone for follow up visit in response to provider referral for case management and/or care coordination services.   Consent to Services:  The patient was given information about Chronic Care Management services, agreed to services, and gave verbal consent prior to initiation of services.  Please see initial visit note for detailed documentation.   Patient agreed to services and verbal consent obtained.   Assessment: Review of patient past medical history, allergies, medications, health status, including review of consultants reports, laboratory and other test data, was performed as part of comprehensive evaluation and provision of chronic care management services.   SDOH (Social Determinants of Health) assessments and interventions performed:    CCM Care Plan  Allergies  Allergen Reactions  . Shellfish Allergy Anaphylaxis and Other (See Comments)    gout  . Iodinated Diagnostic Agents Other (See Comments)    Chronic kidney disease 3b  . Nsaids Other (See Comments)    Chronic kidney disease 3b  . Rivastigmine Other (See Comments)    Side effect and not a true allergy    Outpatient Encounter Medications as of 08/16/2020  Medication Sig Note  . albuterol (VENTOLIN HFA) 108 (90 Base) MCG/ACT inhaler Inhale 2 puffs into the lungs every 4 (four) hours as needed for wheezing.   Marland Kitchen allopurinol (ZYLOPRIM) 100 MG tablet Take 100 mg by mouth daily.   Marland Kitchen atorvastatin (LIPITOR) 20 MG tablet Take 1 tablet (20 mg total) by mouth at bedtime.   . budesonide-formoterol (SYMBICORT) 160-4.5 MCG/ACT inhaler Inhale 2 puffs into the lungs  2 (two) times daily. (Patient not taking: Reported on 08/08/2020) 08/06/2020: Reports not yet started. Using up supply of Advair  . docusate sodium (COLACE) 100 MG capsule Take 100 mg by mouth daily as needed for mild constipation or moderate constipation.    . fexofenadine (ALLEGRA) 180 MG tablet Take 180 mg by mouth daily as needed for allergies.    . fluticasone (FLONASE) 50 MCG/ACT nasal spray Place 2 sprays into both nostrils daily as needed for allergies.    Marland Kitchen gabapentin (NEURONTIN) 100 MG capsule Take 1 capsule (100 mg total) by mouth 2 (two) times daily. (Patient not taking: Reported on 08/08/2020)   . lamoTRIgine (LAMICTAL) 25 MG tablet Take 6 tablets (150 mg total) by mouth 2 (two) times daily Follow titration instructions given in office by your neurology.   . levETIRAcetam (KEPPRA) 500 MG tablet Take 500 mg by mouth 2 (two) times daily.    . Magnesium 250 MG TABS Take 250 mg by mouth daily.   . melatonin 5 MG TABS Take 5 mg by mouth at bedtime as needed (sleep).   . metoprolol succinate (TOPROL-XL) 50 MG 24 hr tablet Take 50 mg by mouth daily.    . Multiple Vitamin (ONE-A-DAY MENS PO) Take 1 tablet by mouth daily.    Alveda Reasons 10 MG TABS tablet Take 1 tablet (10 mg total) by mouth daily before breakfast.    No facility-administered encounter medications on file as of 08/16/2020.    Patient Active Problem List   Diagnosis Date Noted  . Acute metabolic encephalopathy 91/63/8466  . COPD (chronic  obstructive pulmonary disease) ([place]) 07/20/2020  . Slurred speech 06/04/2020  . CKD (chronic kidney disease) stage 3, GFR 30-59 ml/min (HCC) 06/04/2020  . Essential hypertension 06/04/2020  . Hyperlipidemia 06/04/2020  . Lewy body dementia without behavioral disturbance ([place]) 05/21/2020  . Hyperlipidemia associated with type 2 diabetes mellitus ([place]) 05/21/2020  . Status post total right knee replacement 01/22/2020  . Anemia of chronic kidney failure, stage 3 (moderate) ([place]) 01/16/2020  .  Total knee replacement status 11/02/2019  . History of total knee arthroplasty 11/02/2019  . PAD (peripheral artery disease) ([place]) 10/13/2019  . Benign prostatic hyperplasia   . Seizures ([place])   . Anemia due to stage 4 chronic kidney disease ([place]) 06/24/2019  . Generalized weakness 06/24/2019  . Anemia in chronic kidney disease ([facility]) 06/24/2019  . Asthenia 06/24/2019  . Primary osteoarthritis of right knee 04/03/2019  . Ankle ulcer ([place]) 05/08/2018  . Use of cane as ambulatory aid 04/27/2018  . Dependence on other enabling machines and devices 04/27/2018  . Type 2 diabetes mellitus with stage 4 chronic kidney disease, without long-term current use of insulin ([place]) 06/11/2017  . Lymphedema 05/04/2016  . Chronic venous insufficiency 05/04/2016  . Toe sprain 01/24/2016  . Morbid obesity with body mass index of 40.0-44.9 in adult [facility]) 05/24/2014  . Body mass index (BMI)40.0-44.9, adult 05/24/2014  . OSA on CPAP 01/10/2014  . CKD (chronic kidney disease), stage IV ([place]) 11/22/2013  . Allergic rhinitis 11/06/2013  . Benign hypertension with CKD (chronic kidney disease) stage IV ([place]) 11/06/2013  . Epilepsy ([place]) 11/06/2013  . Gout 11/06/2013  . History of DVT (deep vein thrombosis) 11/06/2013  . Chronic deep vein thrombosis (DVT) of proximal vein of lower extremity ([place]) 11/06/2013  . 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 Personal history of other venous thrombosis and embolism 11/06/2013    Conditions to be addressed/monitored:HTN, DMII and Dementia  Care Plan : RNCM: Lewey Body Dementia (Adult)  Updates made by Vanita Ingles since 08/16/2020 12:00 AM    Problem: RNCM: Management of Lewey Body Dementia   Priority: High    Long-Range Goal: RNCM: Behavior Symptoms Management   Expected End Date: 10/12/2020  Priority: High  Note:   CARE PLAN ENTRY (see longtitudinal plan of care for additional care  plan information)  Current Barriers:  . Chronic Disease Management support, education, and care coordination needs related to Dementia  Clinical Goal(s) related to Dementia: Lewey Body Dementia Over the next 120 days, patient will:  . Work with the care management team to address educational, disease management, and care coordination needs  . Begin or continue self health monitoring activities as directed today  support and education for patient and caregiver with diagnosis of Lewey body dementia and decline in health . Call provider office for new or worsened signs and symptoms New or worsened symptom related to dementia and changes in conditions . Call care management team with questions or concerns . Verbalize basic understanding of patient centered plan of care established today  Interventions related to Dementia:  . Evaluation of current treatment plans and patient's adherence to plan as established by provider.  06-14-2020: The patient saw pcp on 06-11-2020. Referral for CCM team support. The patient has been living with the daughter and her family since October. His wife is actively taking radiation treatments at this time and due to decline in the patients health the daughter has moved the patient in with  her and her family. The patient saw neurologist today and further testing will be done by an in-home study and a sub referral for additional diagnosis and treatment options. 07-19-2020: The patient has had a further decline in dementia and is aggressive at times. He pulls his diaper off and is wetting the bed. His daughter has ask the other provider about getting a hoyer lift as she has hurt herself when trying to lift the patient. The patient has become totally dependent on his daughter for feeding, bathroom needs and all care. The daughter is open for recommendations as home health stopped coming as of yesterday due to the patient not progressing in his care. 08-16-2020: The patient continues  to decline. He is falling more, he is not remembering his family, having issues with swallowing. The daughter is experiencing care giver burnout. She is trying to keep him in her home but it is getting harder each day. She is overwhelmed and does not know what to do to help the patient. Empathetic listening and support. Call to LCSW to assist with collaboration and help for the patient and patients caregiver. LCSW will email additional resources and also move appointment up to call the patients daughter sooner.  . Assessed patient understanding of disease states.  The daughter verbalized the patient does not fully understand what is going on but goes to appointments, eats well, takes medications, and is working with OT/PT in the home. An aide come 2 times a week and bathes the patient.  07-19-2020: The patient has had further decline in his condition. She called memory care places but they have recommended skilled nursing. Will collaborate with LCSW and pcp. 08-16-2020: The patient continues to decline. The daughter has talked to several agencies and there is a waiting list for 6 months or greater. Increased fall risk as the patient continues to fall out of the bed and out of his wheelchair. Advised the daughter if the patient has a fall again to have him evaluated and ask for physical therapy referral.  . Assessed patient's education and care coordination needs.  The daughter spoke to the LCSW earlier today and care guide referral made. RNCM reviewed the role of the CCM team and support of the CCM team in the help of the patient and caregiver meeting the healthy and wellness needs of the patient. 1--27-2022: Provided information to the patient daughter about [facility] helping with incontinence supplies. Also sent images of washable pads to the daughter to consider to help with patient and having incontinence episodes. The daughter is open to recommendations for help as she only has her husband helping her and he is  a truck driver and not always home. 08-16-2020: The daughter is seeking any help that she can get to get the help the patient needs.  She is concerned over her own health and safety. She is appreciative of the CCM team and support. She is willing to do what she can to help the patient get the care he needs.  . Provided disease specific education to patient.  Education given on resources available and how to contact the RNCM. Education on follow up calls and reaching out to the [facility] for new concerns before next outreach. 07-19-2020: Additional information provided to discuss with [facility] at upcoming visit the hospice benefit and instructed the patients daughter to write down questions to ask about recommendations. Will discuss with the pcp about a new FL2.  The patients daughter is aware that the pcp will be out  of the office until 08-01-2020. 08-16-2020: The daughter is taking the patient to several upcoming appointments. The patient has a swallowing evaluation next week, sees pcp for paperwork and TB testing. Additional information provided today via email for long term placement facilities.  Nash Dimmer with appropriate clinical care team members regarding patient needs.  The patient is currently working with the LCSW. Will refer the pharmacist for help with medication cost constraints. 07-19-2020: Co-collaboration with the LCSW on resources for LTC facility information and help. The LCSW plans to reach out to the patients daughter next week also. 08-16-2020: Daughter tearful on the phone with the [facility]. Ask for suggestions to respite care.  Co-collaboration by phone with the LCSW.  . - action plan for worsening symptoms mutually developed . - Financial risk analyst provided . - symptom review completed . -safety reviewed- the family uses baby monitors and knows when the patient attempts to get out of bed . -medication safety reviewed- the daughter given the patient his medications and manages his  medications.   Patient Self Care Activities related to Dementia:  . Patient is unable to independently self-manage chronic health conditions  09-13-2020 at 1:45 pm   Care Plan : RNCM: Management of Diabetes Type 2 (Adult)  Updates made by Vanita Ingles since 08/16/2020 12:00 AM    Problem: RNCM: Glycemic Management (Diabetes, Type 2)   Priority: Medium    Goal: RNCM: Management of DM   Priority: Medium  Note:   Objective:  Lab Results  Component Value Date   HGBA1C 5.9 (H) 06/05/2020 .   Lab Results  Component Value Date   CREATININE 1.94 (H) 06/06/2020   CREATININE 1.86 (H) 06/06/2020   CREATININE 1.79 (H) 06/04/2020 .   Marland Kitchen No results found for: EGFR Current Barriers:  Marland Kitchen Knowledge Deficits related to basic Diabetes pathophysiology and self care/management . Knowledge Deficits related to medications used for management of diabetes . Limited Social Support . Unable to independently manage DM as evidence of advancing dementia and inability to manage care without the help of his daughter . Unable to self administer medications as prescribed . Unable to perform ADLs independently . Unable to perform IADLs independently Case Manager Clinical Goal(s):  Marland Kitchen Over the next 120 days, patient will demonstrate improved adherence to prescribed treatment plan for diabetes self care/management as evidenced by:  . daily monitoring and recording of CBG  . adherence to ADA/ carb modified diet . adherence to prescribed medication regimen Interventions:  . Provided education to patient about basic DM disease process . Reviewed medications with patient and discussed importance of medication adherence . Discussed plans with patient for ongoing care management follow up and provided patient with direct contact information for care management team . Provided patient with written educational materials related to hypo and hyperglycemia and importance of correct treatment . Advised patient, providing  education and rationale, to check cbg daily and record, calling pcp for findings outside established parameters.   . Review of patient status, including review of consultants reports, relevant laboratory and other test results, and medications completed. . - A1C testing facilitated . - barriers to adherence to treatment plan identified . - blood glucose readings reviewed . - resources required to improve adherence to care identified . - self-awareness of signs/symptoms of hypo or hyperglycemia encouraged Patient Goals/Self-Care Activities . Over the next 120 days, patient will:  - UNABLE to independently manage DM Self administers oral medications as prescribed Attends all scheduled provider appointments Checks blood  sugars as prescribed and utilize hyper and hypoglycemia protocol as needed Adheres to prescribed ADA/carb modified Follow Up Plan: Telephone follow up appointment with care management team member scheduled for: 09-13-2020 at 1:45 pm   Care Plan : RNCM:Hypertension (Adult)  Updates made by Vanita Ingles since 08/16/2020 12:00 AM    Problem: Hypertension (Hypertension)     Goal: RNCM: Management of HTN   Expected End Date: 10/12/2020  Priority: Medium  Note:   Objective:  . Last practice recorded BP readings:  . BP Readings from Last 3 Encounters: .  08/08/20 . (!) 122/58 .  07/22/20 . (!) 166/99 .  06/28/20 . (!) 153/65 .    Marland Kitchen Most recent eGFR/CrCl: No results found for: EGFR  No components found for: CRCL Current Barriers:  Marland Kitchen Knowledge Deficits related to basic understanding of hypertension pathophysiology and self care management . Knowledge Deficits related to understanding of medications prescribed for management of hypertension . 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 Unable to independently HTN as evidence of decline in health and having to move in with daughter to assist with care.  [place] social connections . Unable to  perform ADLs independently . Unable to perform IADLs independently Case Manager Clinical Goal(s):  Marland Kitchen Over the next 120 days, patient will verbalize understanding of plan for hypertension management . Over the next 120 days, patient will demonstrate improved adherence to prescribed treatment plan for hypertension as evidenced by taking all medications as prescribed, monitoring and recording blood pressure as directed, adhering to low sodium/DASH diet . Over the next 120 days, patient will demonstrate improved health management independence as evidenced by checking blood pressure as directed and notifying PCP if SBP>160 or DBP > 90, taking all medications as prescribe, and adhering to a low sodium diet as discussed. Interventions:  Marland Kitchen UNABLE to independently:manage health and wellness due to decline in memory and recent moving in with his daughter to assist with his care . Evaluation of current treatment plan related to hypertension self management and patient's adherence to plan as established by provider. . Provided education to patient re: stroke prevention, s/s of heart attack and stroke, DASH diet, complications of uncontrolled blood pressure . Reviewed medications with patient and discussed importance of compliance. Pharmacist referral for questions and financial help for Xarelto  . Discussed plans with patient for ongoing care management follow up and provided patient with direct contact information for care management team . Review of dietary habits. The patient is having to be feed now as he can not see to feed himself. The daughter is having a hard time with caring for the patient.  . Advised patient, providing education and rationale, to monitor blood pressure daily and record, calling PCP for findings outside established parameters.  . - blood pressure trends reviewed . - depression screen reviewed . - home or ambulatory blood pressure monitoring encouraged Patient Goals/Self-Care  Activities . Over the next 120 days, patient will:  - Self administers medications as prescribed Checks BP and records as discussed Follows a low sodium diet/DASH diet Follow Up Plan: Telephone follow up appointment with care management team member scheduled for: 09-13-2020 at 1:45 pm     Plan:Telephone follow up appointment with care management team member scheduled for:  09-13-2020 at 1:45 pm  Noreene Larsson RN, MSN, [place] Mobile: [phone]

## 2020-08-17 ENCOUNTER — Telehealth: Payer: Self-pay | Admitting: Pulmonary Disease

## 2020-08-17 NOTE — Telephone Encounter (Signed)
ONO reviewed by Dr. Verda Cumins need for o2 at night.   Lm for patient.

## 2020-08-17 NOTE — Patient Instructions (Signed)
Licensed Clinical Social Worker Visit Information  Goals we discussed today:  Goals Addressed            This Visit's Progress   . Keeping Myself/Loved One Safe-Dementia       Timeframe:  Long-Range Goal Priority:  Medium Start Date:  06/14/20, goal revisited on 07/24/20                        Expected End Date: 09/12/20                    Follow Up Date- 45 days from 07/24/20   - avoid busy places that are confusing such as the shopping malls - check hot water heater temperature, turn down if too hot - get rid of poisonous plants - keep medicine where it is safe or locked - keep power tools, knives and cleaning products in a safe place - put deadbolts either high or low on outside doors - put car keys out of sight - remove knobs from stove and oven - remove vitamins, medicine, sugar substitutes and seasonings from the kitchen table and counters - supervise the use of tobacco and alcohol - use a GPS device in the car - use an audio or video monitor - use appliances that have an auto shut-off feature - use motion-sensor alarms outdoors, in the bedroom and the kitchen - use seat cushions, floor mats and bed pads that are wired to alarm when getting up    Why is this important?    Safety is important when you or your loved one has dementia.   Eyesight, hearing and changes in feelings of hot and cold or depth-perception may occur.   Wandering or getting lost may happen.   You/your loved one may have to stop driving.   Taking steps to improve safety can prevent injuries.   It will also help you/your loved one feel more relaxed.   It will help you/your loved one be independent for a longer time.     Assessment, press & current barriers:  Patient unable to consistently perform activities of daily living and needs additional assistance and support in order to meet this unmet need . Limited social support, ADL IADL limitations, Social Isolation, Limited access to caregiver,  Cognitive Deficits, Memory Deficits, Inability to perform ADL's independently, and Inability to perform IADL's independently . Unable to self administer medications as prescribed . Lacks social connections  Clinical Goals:   Over the next 120 days, patient/caregiver will work with SW to address concerns related to care coordination needs, lack of a stable support network and lack of Brewing technologist. LCSW will assist patient in gaining additional support in order to maintain health and mental health appropriately   Over the next 120 days, patient will demonstrate improved adherence to self care as evidenced by implementing healthy self-care into his daily routine such as: attending all medical appointments, deep breathing exercises, taking time for self-reflection, taking medications as prescribed, drinking water and daily exercise to improve mobility and mood.   Over the next 120 days, patient will demonstrate improved health management independence as evidenced by implementing healthy self-care skills and positive support/resource implementation into their daily routine to help cope with stressors and improve overall health and well-being   Over the next 120 days, patient or caregiver will verbalize basic understanding of depression/stress process and self health management plan as evidenced by his participation in development of long term plan  of care and institution of self health management strategies  Interventions : . Assessed needs, level of care concerns, basic eligibility and provided education on available PCS resources within their area. . Daughter reports that Palliative Care completed a home visit today and they were informed that patient is not eligible for Hospice.  . Patient has Lewy Body Dementia and is in need of a higher level of care/support. Family prefers in home support over LTC placement at this time. Reviewed community support options ( CAP, private  pay, PACE program) . Will collaborate with primary care provider when needed . Daughter reports that they are looking into getting a foyer lift because patient is unable to use his left side of the body and is unable to ambulate out of the bed. . Daughter reports that family applied for Special Assistance Medicaid but several facilities with memory care that they were looking into were only able to accept patients under private pay for 3 years until they would accept him for Medicaid. UPDATE- Daughter is going to apply for long term care Medicaid (for skilled nursing) today on 07/24/20. Daughter was advised to contact DSS to advise them to disregard the Special Assistance Medicaid application but to consider the LTC application. Daughter will contact DSS today. Daughter has been talking with the admissions coordinator at [facility] regarding their long term care program.  . [facility] ended because patient was not making progress with therapy.  . Email sent again to daughter on 07/24/20 with personal care service resources and long term care facilities within her area.  . Patient interviewed and appropriate assessments performed . Referred patient to community resources care guide team for assistance with in home support through [facility] . Care Guide referral placed in the past but they were unable to provide any further community resource assistance.  . Provided mental health counseling with regard to caregiver strain . Discussed plans with patient for ongoing care management follow up and provided patient with direct contact information for care management team . Assisted patient/caregiver with obtaining information about health plan benefits . Provided education and assistance to client regarding Advanced Directives. . Provided education to patient/caregiver regarding level of care options. . Provided education to patient/caregiver about Hospice and/or Palliative Care services . Patient had a right  knee replacement in May of 2021. Marland Kitchen Patient completed neurologist appointment on 06/14/20. Marland Kitchen CCM LCSW educated family on LTC placement. Patient is no longer needing memory care but LTC skilled nursing.  . Other interventions provided: Solution-Focused Strategies   Follow Up Plan:  . SW will follow up with patient by phone over the next quarter         Eula Fried, [place], MSW, [email or web address] Phone: [phone]

## 2020-08-18 DIAGNOSIS — Z96659 Presence of unspecified artificial knee joint: Secondary | ICD-10-CM | POA: Diagnosis not present

## 2020-08-18 DIAGNOSIS — S86811A Strain of other muscle(s) and tendon(s) at lower leg level, right leg, initial encounter: Secondary | ICD-10-CM | POA: Diagnosis not present

## 2020-08-20 ENCOUNTER — Ambulatory Visit (INDEPENDENT_AMBULATORY_CARE_PROVIDER_SITE_OTHER): Payer: Medicare Other

## 2020-08-20 ENCOUNTER — Other Ambulatory Visit: Payer: Self-pay

## 2020-08-20 DIAGNOSIS — Z23 Encounter for immunization: Secondary | ICD-10-CM

## 2020-08-20 NOTE — Telephone Encounter (Signed)
[name] daughter(DPR) is aware of results.  She voiced her understanding and had no further questions.  Nothing further needed at this time.

## 2020-08-21 ENCOUNTER — Ambulatory Visit (INDEPENDENT_AMBULATORY_CARE_PROVIDER_SITE_OTHER): Payer: Medicare Other | Admitting: Licensed Clinical Social Worker

## 2020-08-21 ENCOUNTER — Other Ambulatory Visit: Payer: Medicare Other | Admitting: Primary Care

## 2020-08-21 ENCOUNTER — Telehealth: Payer: Self-pay

## 2020-08-21 DIAGNOSIS — D631 Anemia in chronic kidney disease: Secondary | ICD-10-CM | POA: Diagnosis not present

## 2020-08-21 DIAGNOSIS — N184 Chronic kidney disease, stage 4 (severe): Secondary | ICD-10-CM

## 2020-08-21 DIAGNOSIS — J449 Chronic obstructive pulmonary disease, unspecified: Secondary | ICD-10-CM

## 2020-08-21 DIAGNOSIS — E1122 Type 2 diabetes mellitus with diabetic chronic kidney disease: Secondary | ICD-10-CM

## 2020-08-21 DIAGNOSIS — I129 Hypertensive chronic kidney disease with stage 1 through stage 4 chronic kidney disease, or unspecified chronic kidney disease: Secondary | ICD-10-CM

## 2020-08-21 DIAGNOSIS — N183 Chronic kidney disease, stage 3 unspecified: Secondary | ICD-10-CM

## 2020-08-21 DIAGNOSIS — F0280 Dementia in other diseases classified elsewhere without behavioral disturbance: Secondary | ICD-10-CM

## 2020-08-21 DIAGNOSIS — G3183 Dementia with Lewy bodies: Secondary | ICD-10-CM | POA: Diagnosis not present

## 2020-08-21 DIAGNOSIS — I5030 Unspecified diastolic (congestive) heart failure: Secondary | ICD-10-CM | POA: Diagnosis not present

## 2020-08-21 DIAGNOSIS — I890 Lymphedema, not elsewhere classified: Secondary | ICD-10-CM | POA: Diagnosis not present

## 2020-08-21 DIAGNOSIS — R413 Other amnesia: Secondary | ICD-10-CM | POA: Diagnosis not present

## 2020-08-21 NOTE — Chronic Care Management (AMB) (Signed)
Chronic Care Management    Clinical Social Work Note  08/21/2020 Name: [facility]. MRN: [id number] DOB: Aug 18, 1940  Justin Shannon. is a 80 y.o. year old male who is a primary care patient of [name], DO. The CCM team was consulted to assist the patient with chronic disease management and/or care coordination needs related to: Level of Care Concerns.   Engaged with patient by telephone for follow up visit in response to provider referral for social work chronic care management and care coordination services.   Consent to Services:  The patient was given the following information about Chronic Care Management services today, agreed to services, and gave verbal consent: 1. CCM service includes personalized support from designated clinical staff supervised by the primary care provider, including individualized plan of care and coordination with other care providers 2. 24/7 contact phone numbers for assistance for urgent and routine care needs. 3. Service will only be billed when office clinical staff spend 20 minutes or more in a month to coordinate care. 4. Only one practitioner may furnish and bill the service in a calendar month. 5.The patient may stop CCM services at any time (effective at the end of the month) by phone call to the office staff. 6. The patient will be responsible for cost sharing (co-pay) of up to 20% of the service fee (after annual deductible is met). Patient agreed to services and consent obtained.  Patient agreed to services and consent obtained.   Assessment: Review of patient past medical history, allergies, medications, and health status, including review of relevant consultants reports was performed today as part of a comprehensive evaluation and provision of chronic care management and care coordination services.     SDOH (Social Determinants of Health) assessments and interventions performed:    Advanced Directives Status: See Care Plan for  related entries.  CCM Care Plan  Allergies  Allergen Reactions  . Shellfish Allergy Anaphylaxis and Other (See Comments)    gout  . Iodinated Diagnostic Agents Other (See Comments)    Chronic kidney disease 3b  . Nsaids Other (See Comments)    Chronic kidney disease 3b  . Rivastigmine Other (See Comments)    Side effect and not a true allergy    Outpatient Encounter Medications as of 08/21/2020  Medication Sig Note  . albuterol (VENTOLIN HFA) 108 (90 Base) MCG/ACT inhaler Inhale 2 puffs into the lungs every 4 (four) hours as needed for wheezing.   Marland Kitchen allopurinol (ZYLOPRIM) 100 MG tablet Take 100 mg by mouth daily.   Marland Kitchen atorvastatin (LIPITOR) 20 MG tablet Take 1 tablet (20 mg total) by mouth at bedtime.   . budesonide-formoterol (SYMBICORT) 160-4.5 MCG/ACT inhaler Inhale 2 puffs into the lungs 2 (two) times daily. (Patient not taking: Reported on 08/08/2020) 08/06/2020: Reports not yet started. Using up supply of Advair  . docusate sodium (COLACE) 100 MG capsule Take 100 mg by mouth daily as needed for mild constipation or moderate constipation.    . fexofenadine (ALLEGRA) 180 MG tablet Take 180 mg by mouth daily as needed for allergies.    . fluticasone (FLONASE) 50 MCG/ACT nasal spray Place 2 sprays into both nostrils daily as needed for allergies.    Marland Kitchen gabapentin (NEURONTIN) 100 MG capsule Take 1 capsule (100 mg total) by mouth 2 (two) times daily. (Patient not taking: Reported on 08/08/2020)   . lamoTRIgine (LAMICTAL) 25 MG tablet Take 6 tablets (150 mg total) by mouth 2 (two) times daily Follow titration instructions given  in office by your neurology.   . levETIRAcetam (KEPPRA) 500 MG tablet Take 500 mg by mouth 2 (two) times daily.    . Magnesium 250 MG TABS Take 250 mg by mouth daily.   . melatonin 5 MG TABS Take 5 mg by mouth at bedtime as needed (sleep).   . metoprolol succinate (TOPROL-XL) 50 MG 24 hr tablet Take 50 mg by mouth daily.    . Multiple Vitamin (ONE-A-DAY MENS PO) Take 1  tablet by mouth daily.    Alveda Reasons 10 MG TABS tablet Take 1 tablet (10 mg total) by mouth daily before breakfast.    No facility-administered encounter medications on file as of 08/21/2020.    Patient Active Problem List   Diagnosis Date Noted  . Acute metabolic encephalopathy 93/[id number]/[id number]  . COPD (chronic obstructive pulmonary disease) ([place]) 07/20/2020  . Slurred speech 06/04/2020  . CKD (chronic kidney disease) stage 3, GFR 30-59 ml/min (HCC) 06/04/2020  . Essential hypertension 06/04/2020  . Hyperlipidemia 06/04/2020  . Lewy body dementia without behavioral disturbance ([place]) 05/21/2020  . Hyperlipidemia associated with type 2 diabetes mellitus ([place]) 05/21/2020  . Status post total right knee replacement 01/22/2020  . Anemia of chronic kidney failure, stage 3 (moderate) ([place]) 01/16/2020  . Total knee replacement status 11/02/2019  . History of total knee arthroplasty 11/02/2019  . PAD (peripheral artery disease) ([place]) 10/13/2019  . Benign prostatic hyperplasia   . Seizures ([place])   . Anemia due to stage 4 chronic kidney disease ([place]) 06/24/2019  . Generalized weakness 06/24/2019  . Anemia in chronic kidney disease ([place]) 06/24/2019  . Asthenia 06/24/2019  . Primary osteoarthritis of right knee 04/03/2019  . Ankle ulcer ([place]) 05/08/2018  . Use of cane as ambulatory aid 04/27/2018  . Dependence on other enabling machines and devices 04/27/2018  . Type 2 diabetes mellitus with stage 4 chronic kidney disease, without long-term current use of insulin ([place]) 06/11/2017  . Lymphedema 05/04/2016  . Chronic venous insufficiency 05/04/2016  . Toe sprain 01/24/2016  . Morbid obesity with body mass index of 40.0-44.9 in adult [facility]) 05/24/2014  . Body mass index (BMI)40.0-44.9, adult 05/24/2014  . OSA on CPAP 01/10/2014  . CKD (chronic kidney disease), stage IV ([place]) 11/22/2013  . Allergic rhinitis 11/06/2013  . Benign hypertension with CKD (chronic kidney disease) stage IV ([place]) 11/06/2013   . Epilepsy ([place]) 11/06/2013  . Gout 11/06/2013  . History of DVT (deep vein thrombosis) 11/06/2013  . Chronic deep vein thrombosis (DVT) of proximal vein of lower extremity ([place]) 11/06/2013  . 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 Personal history of other venous thrombosis and embolism 11/06/2013    Conditions to be addressed/monitored: Dementia; Level of care concerns  Care Plan : General Social Work (Adult)  Updates made by Greg Cutter, LCSW since 08/21/2020 12:00 AM    Problem: Caregiver Stress     Long-Range Goal: Secure LTC placement   Start Date: 08/21/2020  Priority: Medium  Note:    Timeframe:  Long-Range Goal Priority:  Medium Start Date: 08/21/20                   Expected End Date: 11/21/20                  Follow Up Date- 09/18/20   - avoid busy places that are confusing such as the shopping malls - check hot water heater temperature, turn down if  too hot - get rid of poisonous plants - keep medicine where it is safe or locked - keep power tools, knives and cleaning products in a safe place - put deadbolts either high or low on outside doors - put car keys out of sight - remove knobs from stove and oven - remove vitamins, medicine, sugar substitutes and seasonings from the kitchen table and counters - supervise the use of tobacco and alcohol - use a GPS device in the car - use an audio or video monitor - use appliances that have an auto shut-off feature - use motion-sensor alarms outdoors, in the bedroom and the kitchen - use seat cushions, floor mats and bed pads that are wired to alarm when getting up    Why is this important?    Safety is important when you or your loved one has dementia.   Eyesight, hearing and changes in feelings of hot and cold or depth-perception may occur.   Wandering or getting lost may happen.   You/your loved one may have to stop driving.   Taking  steps to improve safety can prevent injuries.   It will also help you/your loved one feel more relaxed.   It will help you/your loved one be independent for a longer time.     Assessment, press & current barriers:  Patient unable to consistently perform activities of daily living and needs additional assistance and support in order to meet this unmet need . Limited social support, ADL IADL limitations, Social Isolation, Limited access to caregiver, Cognitive Deficits, Memory Deficits, Inability to perform ADL's independently, and Inability to perform IADL's independently . Unable to self administer medications as prescribed . Lacks social connections  Clinical Goals:   Over the next 120 days, patient/caregiver will work with SW to address concerns related to care coordination needs, lack of a stable support network and lack of Brewing technologist. LCSW will assist patient in gaining additional support in order to maintain health and mental health appropriately   Over the next 120 days, patient will demonstrate improved adherence to self care as evidenced by implementing healthy self-care into his daily routine such as: attending all medical appointments, deep breathing exercises, taking time for self-reflection, taking medications as prescribed, drinking water and daily exercise to improve mobility and mood.   Over the next 120 days, patient will demonstrate improved health management independence as evidenced by implementing healthy self-care skills and positive support/resource implementation into their daily routine to help cope with stressors and improve overall health and well-being   Over the next 120 days, patient or caregiver will verbalize basic understanding of depression/stress process and self health management plan as evidenced by his participation in development of long term plan of care and institution of self health management strategies  Interventions  : . Assessed needs, level of care concerns, basic eligibility and provided education on the entire LTC placement process.  . Daughter reports that Palliative Care will complete a home visit today and they were informed that patient is not eligible for Hospice just yet as he has to complete a swallowing test first.  . Patient has Lewy Body Dementia and is in need of a higher level of care/support. Family prefers LTC placement at this time. Reviewed community support options ( CAP, private pay, PACE program) . Will collaborate with primary care provider regarding LTC placement. PCP is aware that several facilities have declined patient for placement due to his chronic health care needs.  . Daughter reports  that they received a foyer lift because patient is unable to use his left side of the body and is unable to ambulate out of the bed. . Daughter reports that family applied for Special Assistance Medicaid but several facilities with memory care that they were looking into were only able to accept patients under private pay for 3 years until they would accept him for Medicaid. UPDATE- Daughter is going to apply for long term care Medicaid (for skilled nursing) today on 07/24/20. Daughter was advised to contact DSS to advise them to disregard the Special Assistance Medicaid application but to consider the LTC application. Daughter will contact DSS today. Daughter has been talking with the admissions coordinator at [facility] regarding their long term care program. UPDATE 08/21/20- Daughter reports that she successfully received a reference number for Medicaid and will write it down to provide to [facility]. [facility] has a wait list for Medicaid beds but family still wishes to attempt placement. Patient will complete office visit with PCP tomorrow and will complete FL2 and PPD. Family will turn in financial application to [facility] and then they will review his case for enrollment. Family does NOT want patient  going to either [facility] or [facility]. [facility] Patient will complete [facility]- waiting on FL2, PPD. Turn in financial application. Review and do their assessment.. Is a wait list. skilled. Does not want [facility]. [facility] would not accept him. Per daughter, [facility] didn't have enough staff to accept patient either. [facility] no longer has memory care. Daughter even called several facilities in [facility] but they would not accept patient due to his chronic disease management needs. CCM LCSW advised daughter to contact [facility]. CCM LCSW sent daughter a list of Skilled Nursing Facilities within [place], [place]. However, [place] social worker has already been unable to secure SNF placement for patient as several facilities declined him as well in [facility].  . HH ended because patient was not making progress with therapy.  . Email sent again to daughter last week with a list of long term care facilities within her area.  . Patient interviewed and appropriate assessments performed . Referred patient in the past to community resource care guide team for assistance with in home support through [facility] . Provided mental health counseling with regard to caregiver strain . Discussed plans with patient for ongoing care management follow up and provided patient with direct contact information for care management team . Assisted patient/caregiver with obtaining information about health plan benefits . Provided education and assistance to client regarding Advanced Directives. . Provided education to patient/caregiver regarding level of care options. . Provided education to patient/caregiver about Hospice and/or Palliative Care services . 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 Marland Kitchen Patient completed neurologist appointment on 06/14/20. . Other interventions provided: Solution-Focused  Strategies   Follow Up Plan:  . SW will follow up with patient by phone over the next quarter       Follow Up Plan: SW will follow up with patient by phone over the next 30 days      Eula Fried, [place], MSW, [email or web address] Phone: [phone]

## 2020-08-21 NOTE — Progress Notes (Signed)
[facility] Palliative Care Consult Note Telephone: [phone]  Fax: [phone]    Date of encounter: 08/21/20 PATIENT NAME: Justin Shannon (home)  DOB: 02/19/1941 MRN: [id number]  PRIMARY CARE PROVIDER:    [name], DO,  [address] [place] [address] [phone]  REFERRING PROVIDER:   [name], DO [address] [place],  [place] [address] [phone]  RESPONSIBLE PARTY:   Extended Emergency Contact Information Primary Emergency Contact: Justin, Shannon, Justin Shannon Phone: [phone] Work Phone: [phone] Mobile Phone: [phone] Relation: Daughter Secondary Emergency Contact: Justin Shannon Address: [address] [place]          [place], [place] [address] [place] Phone: [phone] Mobile Phone: [phone] Relation: Spouse  I met face to face with patient and family in  home. Palliative Care was asked to follow this patient by consultation request of Nobie Putnam * to help address advance care planning and goals of care. This is a follow up  visit.   ASSESSMENT AND RECOMMENDATIONS:   1. Advance Care Planning/Goals of Care: Goals include to maximize quality of life and symptom management. Our advance care planning conversation included a discussion about:     The value and importance of advance care planning    Exploration of personal, cultural or spiritual beliefs that might influence medical decisions   Exploration of goals of care in the event of a sudden injury or illness   Identification of a healthcare agent - none other than wife who is also ill.  Creation of an  advance directive document .   We reviewed advance directives. These have been discussed on a previous visit. Also another provider had left a MOST form which they had completed. I reviewed the  choices and verified with patient, cosigned it and had the patient sign it and will upload it to [facility]. Patient wants attempt to resuscitate, limited interventions, use of antibiotics, use of IV fluids. Request no feeding tube.   2. Symptom Management:   Family called needing patient evaluated for a wheezing that has been going on for 10 days. They described the issue as a wheeze in the laryngeal /upper tracheal area.   Respiratory status: Patient has been afebrile and has been satting O2 at 98%. He is not dyspneic and is not uncomfortable or agitated. He does not labor to breathe. He is not ill appearing.  Daughter states pulmonology  MD told him recently he did not need a maintenance inhaler. Patient does have a history of seasonal allergy and asthma. Today I examined him. He was calm and cooperative, interactive. His bilateral bases moved air well  and are clear with an occasional expiratory wheeze.    Daughter states sometimes these secretions oral secretions are caught in his mouth but she denies that he drools. He's able to articulate speech easily and control oral secretions during my interview. He is scheduled for a swallow study under fluoroscopy on 08/22/20.  I administered an albuterol Inhaler dose, two puffs, which he  appears to have gotten most of. Immediately following the wheeze disappeared but then returned within a few minutes. Differentials include asthma exacerbation, although he is not exhibiting any respiratory distress, or potentially a foreign body,  CHF exacerbation.  I would recommend he keep his primary provider appointment in the morning and suggest they obtain  a chest x-ray for more clinical information.  Edema: He has edema  2 + throughout his legs to thighs.  I advised them to administer a dose  torsemide now and to keep his appointment in the morning.  Torsemide seems to have fallen off his med list. Family states they were told to use PRN but don't weigh. He would benefit  from reassessment of CHF management.  Aspiration precautions: I provided education to administer soft foods, to chop meat thoroughly, to eat slowly. Daughter endorses he does eat slowly and he can swallow thin liquids with no problems. He eats in high Fowlers at all times. Due to the stability of these concerns (since 10 days onset) , the fact that he has a primary care appointment in the morning, and  a swallow test scheduled for Friday don't believe currently he needs emergency intervention. However I advised if he were to show any distress of breathing, fever, increase cough or shortness of breath to proceed to  emergency department  immediately.   3. Follow up Palliative Care Visit: Palliative care will continue to follow for goals of care clarification and symptom management. Return 2 weeks or prn.  4. Family /Caregiver/Community Supports:  Lives with daughter and family, looking for ltc placement 5. Cognitive / Functional decline: a and o x 1-2, dependent in all adls and ialds.  I spent 60 minutes providing this consultation,  from 1300 to 1400. More than 50% of the time in this consultation was spent in counseling and care coordination.  CODE STATUS: Code, limited scope  PPS: 30%  HOSPICE ELIGIBILITY/DIAGNOSIS: TBD  Subjective:  CHIEF COMPLAINT: wheeze  HISTORY OF PRESENT ILLNESS:  Justin Shannon. is a 80 y.o. year old male  with Lewy body dementia, parkinsonism, tremor, CHF, edema wheezes. Daughter was concerned about a high pitched wheeze in the laryngeal area . It started 10 days ago and is loud but not impairing pt breathing. Not getting worse or better. On Xarelto, Albuterol relieved it very momentarily, nothing else helps. Pt denies distress.  We are asked to consult around advance care planning and complex medical decision making.    Review and summarization of old Epic records shows or history from other than patient.   Review of case with family member.  daughter  History obtained from review of EMR, discussion with primary team, and  interview with family, caregiver  and/or Justin Shannon. Records reviewed and summarized above.   CURRENT PROBLEM LIST:  Patient Active Problem List   Diagnosis Date Noted  . Acute metabolic encephalopathy 36/14/4315  . COPD (chronic obstructive pulmonary disease) ([place]) 07/20/2020  . Slurred speech 06/04/2020  . CKD (chronic kidney disease) stage 3, GFR 30-59 ml/min (HCC) 06/04/2020  . Essential hypertension 06/04/2020  . Hyperlipidemia 06/04/2020  . Lewy body dementia without behavioral disturbance ([place]) 05/21/2020  . Hyperlipidemia associated with type 2 diabetes mellitus ([place]) 05/21/2020  . Status post total right knee replacement 01/22/2020  . Anemia of chronic kidney failure, stage 3 (moderate) ([place]) 01/16/2020  . Total knee replacement status 11/02/2019  . History of total knee arthroplasty 11/02/2019  . PAD (peripheral artery disease) ([place]) 10/13/2019  . Benign prostatic hyperplasia   . Seizures ([place])   . Anemia due to stage 4 chronic kidney disease ([place]) 06/24/2019  . Generalized weakness 06/24/2019  . Anemia in chronic kidney disease (CODE) 06/24/2019  . Asthenia 06/24/2019  . Primary osteoarthritis of right knee 04/03/2019  . Ankle ulcer ([place]) 05/08/2018  . Use of  cane as ambulatory aid 04/27/2018  . Dependence on other enabling machines and devices 04/27/2018  . Type 2 diabetes mellitus with stage 4 chronic kidney disease, without long-term current use of insulin ([place]) 06/11/2017  . Lymphedema 05/04/2016  . Chronic venous insufficiency 05/04/2016  . Toe sprain 01/24/2016  . Morbid obesity with body mass index of 40.0-44.9 in adult [facility]) 05/24/2014  . Body mass index (BMI)40.0-44.9, adult 05/24/2014  . OSA on CPAP 01/10/2014  . CKD (chronic kidney disease), stage IV ([place]) 11/22/2013  . Allergic rhinitis 11/06/2013  . Benign hypertension with CKD (chronic kidney disease) stage IV ([place])  11/06/2013  . Epilepsy ([place]) 11/06/2013  . Gout 11/06/2013  . History of DVT (deep vein thrombosis) 11/06/2013  . Chronic deep vein thrombosis (DVT) of proximal vein of lower extremity ([place]) 11/06/2013  . 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 Personal history of other venous thrombosis and embolism 11/06/2013   PAST MEDICAL HISTORY:  Active Ambulatory Problems    Diagnosis Date Noted  . Allergic rhinitis 11/06/2013  . Benign hypertension with CKD (chronic kidney disease) stage IV ([place]) 11/06/2013  . CKD (chronic kidney disease), stage IV ([place]) 11/22/2013  . Epilepsy ([place]) 11/06/2013  . Gout 11/06/2013  . History of DVT (deep vein thrombosis) 11/06/2013  . OSA on CPAP 01/10/2014  . Toe sprain 01/24/2016  . Lymphedema 05/04/2016  . Chronic venous insufficiency 05/04/2016  . Morbid obesity with body mass index of 40.0-44.9 in adult [facility]) 05/24/2014  . Chronic deep vein thrombosis (DVT) of proximal vein of lower extremity ([place]) 11/06/2013  . Type 2 diabetes mellitus with stage 4 chronic kidney disease, without long-term current use of insulin ([place]) 06/11/2017  . Use of cane as ambulatory aid 04/27/2018  . Primary osteoarthritis of right knee 04/03/2019  . Anemia due to stage 4 chronic kidney disease ([place]) 06/24/2019  . Generalized weakness 06/24/2019  . Benign prostatic hyperplasia   . Seizures ([place])   . PAD (peripheral artery disease) ([place]) 10/13/2019  . Total knee replacement status 11/02/2019  . Anemia in chronic kidney disease (CODE) 06/24/2019  . 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 ([place]) 11/06/2013  . Personal history of other venous thrombosis and embolism 11/06/2013  . Anemia of chronic kidney failure, stage 3 (moderate) ([place]) 01/16/2020  . Asthenia 06/24/2019  . Body mass index (BMI)40.0-44.9, adult 05/24/2014  . History of total knee  arthroplasty 11/02/2019  . Status post total right knee replacement 01/22/2020  . Dependence on other enabling machines and devices 04/27/2018  . Ankle ulcer ([place]) 05/08/2018  . Lewy body dementia without behavioral disturbance ([place]) 05/21/2020  . Hyperlipidemia associated with type 2 diabetes mellitus ([place]) 05/21/2020  . Slurred speech 06/04/2020  . CKD (chronic kidney disease) stage 3, GFR 30-59 ml/min (HCC) 06/04/2020  . Essential hypertension 06/04/2020  . Hyperlipidemia 06/04/2020  . COPD (chronic obstructive pulmonary disease) ([place]) 07/20/2020  . Acute metabolic encephalopathy 03/00/9233   Resolved Ambulatory Problems    Diagnosis Date Noted  . Health care maintenance 12/11/2014  . Morbid (severe) obesity due to excess calories ([place]) 05/24/2014  . Nocturnal seizures ([place]) 11/06/2013  . Venous ulcer ([place]) 04/19/2017  . Venous ulcer of ankle, right ([place]) 05/08/2018  . Pneumonia due to COVID-19 virus 06/24/2019  . Respiratory failure with hypoxia ([place]) 06/24/2019  . Anemia of chronic renal failure, stage 3a ([place])   . Hyperkalemia   . Acute  kidney injury superimposed on CKD ([place])   . Patellar tendon rupture, right, initial encounter 12/05/2019  . Acute renal failure syndrome ([place]) 01/16/2020  . Rupture of patellar tendon 12/05/2019  . Pneumonia due to coronavirus disease 2019 06/24/2019  . Injury of toe 01/24/2016   Past Medical History:  Diagnosis Date  . Arthritis   . Bilateral swelling of feet   . BPH (benign prostatic hyperplasia)   . Chronic kidney disease   . DVT (deep venous thrombosis) ([place])   . History of gout   . Sleep apnea   . Tendonitis of foot    SOCIAL HX:  Social History   Tobacco Use  . Smoking status: Former Smoker    Packs/day: 1.00    Years: 10.00    Pack years: 10.00    Types: Cigarettes    Quit date: 06/23/1968    Years since quitting: 52.1  . Smokeless tobacco: Never Used  . Tobacco comment: 25 years ago quit  Substance Use Topics  .  Alcohol use: No   FAMILY HX:  Family History  Problem Relation Age of Onset  . Arthritis Mother   . Hypertension Mother   . Diabetes Brother        ALLERGIES:  Allergies  Allergen Reactions  . Shellfish Allergy Anaphylaxis and Other (See Comments)    gout  . Iodinated Diagnostic Agents Other (See Comments)    Chronic kidney disease 3b  . Nsaids Other (See Comments)    Chronic kidney disease 3b  . Rivastigmine Other (See Comments)    Side effect and not a true allergy     PERTINENT MEDICATIONS:  Outpatient Encounter Medications as of 08/21/2020  Medication Sig  . albuterol (VENTOLIN HFA) 108 (90 Base) MCG/ACT inhaler Inhale 2 puffs into the lungs every 4 (four) hours as needed for wheezing.  Marland Kitchen allopurinol (ZYLOPRIM) 100 MG tablet Take 100 mg by mouth daily.  Marland Kitchen atorvastatin (LIPITOR) 20 MG tablet Take 1 tablet (20 mg total) by mouth at bedtime.  . budesonide-formoterol (SYMBICORT) 160-4.5 MCG/ACT inhaler Inhale 2 puffs into the lungs 2 (two) times daily. (Patient not taking: Reported on 08/08/2020)  . docusate sodium (COLACE) 100 MG capsule Take 100 mg by mouth daily as needed for mild constipation or moderate constipation.   . fexofenadine (ALLEGRA) 180 MG tablet Take 180 mg by mouth daily as needed for allergies.   . fluticasone (FLONASE) 50 MCG/ACT nasal spray Place 2 sprays into both nostrils daily as needed for allergies.   Marland Kitchen gabapentin (NEURONTIN) 100 MG capsule Take 1 capsule (100 mg total) by mouth 2 (two) times daily. (Patient not taking: Reported on 08/08/2020)  . lamoTRIgine (LAMICTAL) 25 MG tablet Take 6 tablets (150 mg total) by mouth 2 (two) times daily Follow titration instructions given in office by your neurology.  . levETIRAcetam (KEPPRA) 500 MG tablet Take 500 mg by mouth 2 (two) times daily.   . Magnesium 250 MG TABS Take 250 mg by mouth daily.  . melatonin 5 MG TABS Take 5 mg by mouth at bedtime as needed (sleep).  . metoprolol succinate (TOPROL-XL) 50 MG 24 hr  tablet Take 50 mg by mouth daily.   . Multiple Vitamin (ONE-A-DAY MENS PO) Take 1 tablet by mouth daily.   Alveda Reasons 10 MG TABS tablet Take 1 tablet (10 mg total) by mouth daily before breakfast.   No facility-administered encounter medications on file as of 08/21/2020.    Objective: ROS  General: NAD EYES: denies vision changes,  wears glasses ENMT: denies dysphagia Cardiovascular: denies chest pain Pulmonary: endorses cough, denies increased SOB Abdomen: endorses fair appetite, denies constipation, endorses incontinence of bowel GU: denies dysuria, endorses incontinence of urine MSK:  endorses ROM limitations, no falls reported Skin: denies rashes or wounds Neurological: endorses weakness, endorses occ  pain, endorses occ insomnia, night terrors Psych: Endorses positive mood Heme/lymph/immuno: denies bruises, abnormal bleeding  Physical Exam: Current and past weights: Family states > 250 lbs Constitutional: 97.0 HR 60 RR 22 BP 173/90 General: frail appearing, obese  EYES: anicteric sclera, lids intact, no discharge  ENMT: intact hearing,oral mucous membranes moist, dentition intact, no sialorrhea CV: S1S2, RRR, 2+ bil LE edema to thighs Pulmonary:, slight  increased work of breathing (baseline), occ prod no cough, can clear forcefully, + audible wheezes, room air, PO2= 98% Abdomen: intake 75%, no ascites GU: deferred MSK: mild sarcopenia, decreased ROM in all extremities, no contractures of LE, non ambulatory Skin: warm and dry, no rashes or wounds on visible skin Neuro: Generalized weakness, moderate cognitive impairment Psych: non-anxious affect, A and O x 2 Hem/lymph/immuno: no widespread bruising   Thank you for the opportunity to participate in the care of Justin Shannon.  The palliative care team will continue to follow. Please call our office at [phone] if we can be of additional assistance.  Jason Coop, NP , DNP, MPH, AGPCNP-BC, ACHPN   COVID-19  PATIENT SCREENING TOOL  Person answering questions: _______staff____________   1.  Is the patient or any family member in the home showing any signs or symptoms regarding respiratory infection?                  Person with Symptom  ______________na___________ a. Fever/chills/headache                                                        Yes___ No__X_            b. Shortness of breath                                                            Yes___ No__X_           c. Cough/congestion                                               Yes___  No__X_          d. Muscle/Body aches/pains                                                   Yes___ No__X_         e. Gastrointestinal symptoms (diarrhea,nausea)             Yes___ No__X_         f. Sudden loss of smell or taste      Yes___  No__X_        2. Within the past 10 days, has anyone living in the home had any contact with someone with or under investigation for COVID-19?    Yes___ No__X__   Person __________________

## 2020-08-21 NOTE — Patient Instructions (Signed)
Licensed Clinical Social Worker Visit Information  Goals we discussed today:  Goals Addressed            This Visit's Progress   . Keeping Myself/Loved One Safe-Dementia        Timeframe:  Long-Range Goal Priority:  Medium Start Date: 08/21/20                   Expected End Date: 11/21/20                  Follow Up Date- 09/18/20   - avoid busy places that are confusing such as the shopping malls - check hot water heater temperature, turn down if too hot - get rid of poisonous plants - keep medicine where it is safe or locked - keep power tools, knives and cleaning products in a safe place - put deadbolts either high or low on outside doors - put car keys out of sight - remove knobs from stove and oven - remove vitamins, medicine, sugar substitutes and seasonings from the kitchen table and counters - supervise the use of tobacco and alcohol - use a GPS device in the car - use an audio or video monitor - use appliances that have an auto shut-off feature - use motion-sensor alarms outdoors, in the bedroom and the kitchen - use seat cushions, floor mats and bed pads that are wired to alarm when getting up    Why is this important?    Safety is important when you or your loved one has dementia.   Eyesight, hearing and changes in feelings of hot and cold or depth-perception may occur.   Wandering or getting lost may happen.   You/your loved one may have to stop driving.   Taking steps to improve safety can prevent injuries.   It will also help you/your loved one feel more relaxed.   It will help you/your loved one be independent for a longer time.     Assessment, press & current barriers:  Patient unable to consistently perform activities of daily living and needs additional assistance and support in order to meet this unmet need . Limited social support, ADL IADL limitations, Social Isolation, Limited access to caregiver, Cognitive Deficits, Memory Deficits, Inability to  perform ADL's independently, and Inability to perform IADL's independently . Unable to self administer medications as prescribed . Lacks social connections  Clinical Goals:   Over the next 120 days, patient/caregiver will work with SW to address concerns related to care coordination needs, lack of a stable support network and lack of Brewing technologist. LCSW will assist patient in gaining additional support in order to maintain health and mental health appropriately   Over the next 120 days, patient will demonstrate improved adherence to self care as evidenced by implementing healthy self-care into his daily routine such as: attending all medical appointments, deep breathing exercises, taking time for self-reflection, taking medications as prescribed, drinking water and daily exercise to improve mobility and mood.   Over the next 120 days, patient will demonstrate improved health management independence as evidenced by implementing healthy self-care skills and positive support/resource implementation into their daily routine to help cope with stressors and improve overall health and well-being   Over the next 120 days, patient or caregiver will verbalize basic understanding of depression/stress process and self health management plan as evidenced by his participation in development of long term plan of care and institution of self health management strategies  Interventions : . Assessed  needs, level of care concerns, basic eligibility and provided education on the entire LTC placement process.  . Daughter reports that Palliative Care will complete a home visit today and they were informed that patient is not eligible for Hospice just yet as he has to complete a swallowing test first.  . Patient has Lewy Body Dementia and is in need of a higher level of care/support. Family prefers LTC placement at this time. Reviewed community support options ( CAP, private pay, PACE  program) . Will collaborate with primary care provider regarding LTC placement. PCP is aware that several facilities have declined patient for placement due to his chronic health care needs.  . Daughter reports that they received a foyer lift because patient is unable to use his left side of the body and is unable to ambulate out of the bed. . Daughter reports that family applied for Special Assistance Medicaid but several facilities with memory care that they were looking into were only able to accept patients under private pay for 3 years until they would accept him for Medicaid. UPDATE- Daughter is going to apply for long term care Medicaid (for skilled nursing) today on 07/24/20. Daughter was advised to contact DSS to advise them to disregard the Special Assistance Medicaid application but to consider the LTC application. Daughter will contact DSS today. Daughter has been talking with the admissions coordinator at [facility] regarding their long term care program. UPDATE 08/21/20- Daughter reports that she successfully received a reference number for Medicaid and will write it down to provide to [facility] has a wait list for Medicaid beds but family still wishes to attempt placement. Patient will complete office visit with PCP tomorrow and will complete FL2 and PPD. Family will turn in financial application to [facility] and then they will review his case for enrollment. Family does NOT want patient going to either [facility] or [facility]. [facility] will complete [facility]- waiting on FL2, PPD. Turn in financial application. Review and do their assessment.. Is a wait list. skilled. Does not want [facility]. [facility] would not accept him. Per daughter, [facility] didn't have enough staff to accept patient either. [facility] no longer has memory care. Daughter even called several facilities in [facility] but they would not  accept patient due to his chronic disease management needs. CCM LCSW advised daughter to contact [facility] [place] as they have SNF. CCM LCSW sent daughter a list of Skilled Nursing Facilities within [place], [place]. However, [facility] social worker has already been unable to secure SNF placement for patient as several facilities declined him as well in [facility].  . HH ended because patient was not making progress with therapy.  . Email sent again to daughter last week with a list of long term care facilities within her area.  . Patient interviewed and appropriate assessments performed . Referred patient in the past to community resource care guide team for assistance with in home support through [facility] . Provided mental health counseling with regard to caregiver strain . Discussed plans with patient for ongoing care management follow up and provided patient with direct contact information for care management team . Assisted patient/caregiver with obtaining information about health plan benefits . Provided education and assistance to client regarding Advanced Directives. . Provided education to patient/caregiver regarding level of care options. . Provided education to patient/caregiver about Hospice and/or Palliative Care services .  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 Marland Kitchen Patient completed neurologist appointment on 06/14/20. . Other interventions provided: Solution-Focused Strategies   Follow Up Plan:  . SW will follow up with patient by phone over the next quarter

## 2020-08-21 NOTE — Telephone Encounter (Signed)
TC placed to patient's daughter in response to message left. NP to visit today to assess patient.

## 2020-08-22 ENCOUNTER — Other Ambulatory Visit: Payer: Self-pay

## 2020-08-22 ENCOUNTER — Encounter: Payer: Self-pay | Admitting: Family Medicine

## 2020-08-22 ENCOUNTER — Ambulatory Visit (INDEPENDENT_AMBULATORY_CARE_PROVIDER_SITE_OTHER): Payer: Medicare Other | Admitting: Family Medicine

## 2020-08-22 VITALS — BP 146/62 | HR 63 | Ht 72.0 in | Wt 297.0 lb

## 2020-08-22 DIAGNOSIS — G3183 Dementia with Lewy bodies: Secondary | ICD-10-CM

## 2020-08-22 DIAGNOSIS — I825Y9 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: Secondary | ICD-10-CM

## 2020-08-22 DIAGNOSIS — G4733 Obstructive sleep apnea (adult) (pediatric): Secondary | ICD-10-CM | POA: Diagnosis not present

## 2020-08-22 DIAGNOSIS — G20 Parkinson's disease: Secondary | ICD-10-CM | POA: Diagnosis not present

## 2020-08-22 DIAGNOSIS — I890 Lymphedema, not elsewhere classified: Secondary | ICD-10-CM | POA: Diagnosis not present

## 2020-08-22 DIAGNOSIS — Z86718 Personal history of other venous thrombosis and embolism: Secondary | ICD-10-CM

## 2020-08-22 DIAGNOSIS — I129 Hypertensive chronic kidney disease with stage 1 through stage 4 chronic kidney disease, or unspecified chronic kidney disease: Secondary | ICD-10-CM

## 2020-08-22 DIAGNOSIS — R1310 Dysphagia, unspecified: Secondary | ICD-10-CM | POA: Diagnosis not present

## 2020-08-22 DIAGNOSIS — F0280 Dementia in other diseases classified elsewhere without behavioral disturbance: Secondary | ICD-10-CM

## 2020-08-22 DIAGNOSIS — I5030 Unspecified diastolic (congestive) heart failure: Secondary | ICD-10-CM | POA: Diagnosis not present

## 2020-08-22 DIAGNOSIS — E1122 Type 2 diabetes mellitus with diabetic chronic kidney disease: Secondary | ICD-10-CM | POA: Diagnosis not present

## 2020-08-22 DIAGNOSIS — Z6841 Body Mass Index (BMI) 40.0 and over, adult: Secondary | ICD-10-CM

## 2020-08-22 DIAGNOSIS — I739 Peripheral vascular disease, unspecified: Secondary | ICD-10-CM | POA: Diagnosis not present

## 2020-08-22 DIAGNOSIS — N184 Chronic kidney disease, stage 4 (severe): Secondary | ICD-10-CM

## 2020-08-22 DIAGNOSIS — Z9989 Dependence on other enabling machines and devices: Secondary | ICD-10-CM

## 2020-08-22 DIAGNOSIS — G40909 Epilepsy, unspecified, not intractable, without status epilepticus: Secondary | ICD-10-CM

## 2020-08-22 DIAGNOSIS — D631 Anemia in chronic kidney disease: Secondary | ICD-10-CM

## 2020-08-22 LAB — TB SKIN TEST
Induration: 0 mm
TB Skin Test: NEGATIVE

## 2020-08-22 LAB — POCT GLYCOSYLATED HEMOGLOBIN (HGB A1C): Hemoglobin A1C: 5.5 % (ref 4.0–5.6)

## 2020-08-22 NOTE — Patient Instructions (Addendum)
  We will complete the [facility] Application  We will complete the FL2  If needed let me know we can refer to the [facility].  Updated med list today. Hold off on new meds.  Please schedule a Follow-up Appointment to: Return in about 3 months (around December 19, 2020), or if symptoms worsen or fail to improve, for dementia.  If you have any other questions or concerns, please feel free to call the office or send a message through [place]. You may also schedule an earlier appointment if necessary.  Additionally, you may be receiving a survey about your experience at our office within a few days to 1 week by e-mail or mail. We value your feedback.  Nobie Putnam, DO [place]

## 2020-08-22 NOTE — Progress Notes (Signed)
Subjective:    Patient ID: Justin Shannon., male    DOB: 1941/02/28, 80 y.o.   MRN: [id number]  Justin Shannon. is a 80 y.o. male presenting on 08/22/2020 for Diabetes and Wheezing   HPI   Level of Care Family is providing nearly 24 hour care for patient with support with ADLs, transitions, mobility, medication admin, transportation to doctors appointments. Patient has had cognitive decline. Here for evaluation of refer to Justin Shannon. He is working with Chronic Care Management Team / Social Work.  Palliative Care Patient Followed by Justin Shannon, with home visits to assist with symptom management and evaluation.  Wheezing Followed by Dr Patsey Berthold pulmonology and recent evaluation 08/08/20, has had overnight oximetry test showed negative, not needing supplement oxygen. Additionally advised no longer needing symbicort. Not entirely consistent with COPD on evaluation.  Dysphagia Upcoming Swallow Study is scheduled for Friday 08/24/20. Concern for high risk for aspiration. He has had audible wheezing recently.  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 Has not seen Cardiology. He is still experiencing notable lower extremity swelling. He is sedentary and limited mobility due to generalized weakness, and difficulty in assisting with transfers.  He is followed by Dr Sherri Rad Nephrology He has been taken off ACEi/ARB in past. Remains only on Beta Blocker Metoprolol XL 50mg  daily for BP. He is also on Torsemide 20mg  daily PRN only for edema. Not taking regularly. Has recently restarted this. There was question today about if other medications would benefit his breathing and fluid, that can help his heart, with the diastolic dysfunction.  Lewy Body Dementia w Park[place] features Progressive Cognitive Decline Anxiety with Fear of Falling, Hallucinations Seizure  Disorder  Followed by [facility] Neurology - Justin Curry PA / Dr Manuella Ghazi Patient has had testing and evaluation by neuro. He has been on variety of medications including trial on anti-cholinergic medication such as Rivastigmine Exelon (failed due to night terrors), they have considered 2nd gen anti-psychotic medications (Haldol). He has been treated on Clonazepam for anxiety, has failed this with side effect. Trial on other seizure medication (Lamictal, failed). Remains on keppra. Unable to wean off Keppra still taking 500mg  BID - per Neuro Taking Magnesium 250mg  daily  Gout Chronic problem No recent acute flare Continues on Allopurinol  Constipation Colace PRN  Allergies Allegra PRN Flonase PRN   Depression screen [facility] 2/9 06/14/2020 05/21/2020 05/27/2019  Decreased Interest 0 0 0  Down, Depressed, Hopeless 0 0 0  PHQ - 2 Score 0 0 0  Some recent data might be hidden    Past Medical History:  Diagnosis Date  . Arthritis   . Bilateral swelling of feet   . BPH (benign prostatic hyperplasia)   . Chronic kidney disease   . DVT (deep venous thrombosis) (HCC)    right lower extremity  . History of gout   . Seizures ([place])   . Sleep apnea   . Tendonitis of foot    left    Social History   Tobacco Use  . Smoking status: Former Smoker    Packs/day: 1.00    Years: 10.00    Pack years: 10.00    Types: Cigarettes    Quit date: 06/23/1968    Years since quitting: 52.2  . Smokeless tobacco: Never Used  . Tobacco comment: 25 years ago quit  Vaping Use  . Vaping Use: Never used  Substance Use Topics  .  Alcohol use: No  . Drug use: No    Review of Systems Per HPI unless specifically indicated above     Objective:    BP (!) 146/62   Pulse 63   Ht 6' (1.829 m)   Wt 297 lb (134.7 kg)   SpO2 100%   BMI 40.28 kg/m   Wt Readings from Last 3 Encounters:  08/22/20 297 lb (134.7 kg)  08/08/20 297 lb (134.7 kg)  07/21/20 280 lb (127 kg)    Physical Exam Vitals and nursing  note reviewed.  Constitutional:      General: He is not in acute distress.    Appearance: He is well-developed and well-nourished. He is obese. He is not diaphoretic.     Comments: Chronically ill appearing, mostly comfortable  HENT:     Head: Normocephalic and atraumatic.     Mouth/Throat:     Mouth: Oropharynx is clear and moist.  Eyes:     General:        Right eye: No discharge.        Left eye: No discharge.     Conjunctiva/sclera: Conjunctivae normal.  Neck:     Thyroid: No thyromegaly.  Cardiovascular:     Rate and Rhythm: Normal rate and regular rhythm.     Pulses: Intact distal pulses.     Heart sounds: Normal heart sounds. No murmur heard.   Pulmonary:     Effort: Pulmonary effort is normal. No respiratory distress.     Breath sounds: Normal breath sounds. No wheezing or rales.     Comments: Audible wheezing upper airway or stridor very temporarily during visit related to patient sleeping and abnormal breathing pattern.   Auscultation of lungs was clear without focal abnormality no wheezing or crackles. Musculoskeletal:        General: Normal range of motion.     Cervical back: Normal range of motion and neck supple.     Right lower leg: Edema present.     Left lower leg: Edema present.  Lymphadenopathy:     Cervical: No cervical adenopathy.  Skin:    General: Skin is warm and dry.     Findings: No erythema or rash.  Neurological:     Comments: Not oriented. Drowsy. Sleepy at times during visit.  Psychiatric:        Mood and Affect: Mood and affect normal.     Comments: Well groomed, poor eye contact, incoherent or not consistent speech. Thoughts can be intact but not appropriate to conversation.  Behavior of anxious or worried about falling during visit. Requesting to hold a hand.      I have personally reviewed the radiology report from 06/05/20.  ECHOCARDIOGRAM REPORT    Patient Name:  [facility]. Date of Exam: 06/05/2020  Medical Rec #:  [id number]     Height:    70.0 in  Accession #:  [id number]     Weight:    296.8 lb  Date of Birth: July 30, 1940      BSA:     2.469 m  Patient Age:  29 years      BP:      138/66 mmHg  Patient Gender: M         HR:      72 bpm.  Exam Location: [facility]   Procedure: 2D Echo, Color Doppler and Cardiac Doppler   Indications:   TIA 435.9    History:     Patient has no prior history of Echocardiogram  examinations.          DVT.    Sonographer:   Sherrie Sport RDCS (AE)  Referring Phys: [id number] Cleaster Corin PATEL  Diagnosing Phys: Harrell Gave End MD     Sonographer Comments: Technically challenging study due to limited  acoustic windows, no subcostal window and no parasternal window.  IMPRESSIONS    1. Left ventricular ejection fraction, by estimation, is >55%. The left  ventricle has normal function. Left ventricular endocardial border not  optimally defined to evaluate regional wall motion. Left ventricular  diastolic parameters are consistent with  Grade II diastolic dysfunction (pseudonormalization). Elevated left atrial  pressure.  2. Right ventricular systolic function was not well visualized. The right  ventricular size is mildly enlarged.  3. Left atrial size was mildly dilated.  4. The mitral valve is grossly normal. Mild mitral valve regurgitation.  No evidence of mitral stenosis.  5. The aortic valve was not well visualized. Aortic valve regurgitation  is not visualized. No aortic stenosis is present.  6. Pulmonic valve regurgitation not well assessed.   FINDINGS  Left Ventricle: Left ventricular size and wall thickness are not well  assessed. Left ventricular ejection fraction, by estimation, is >55%. The  left ventricle has normal function. Left ventricular endocardial border  not optimally defined to evaluate  regional wall motion. Left ventricular diastolic parameters are consistent  with  Grade II diastolic dysfunction (pseudonormalization). Elevated left  atrial pressure.   Right Ventricle: The right ventricular size is mildly enlarged. Right  vetricular wall thickness was not well visualized. Right ventricular  systolic function was not well visualized.   Left Atrium: Left atrial size was mildly dilated.   Right Atrium: Right atrial size was normal in size.   Pericardium: Trivial pericardial effusion is present.   Mitral Valve: The mitral valve is grossly normal. Mild mitral valve  regurgitation. No evidence of mitral valve stenosis.   Tricuspid Valve: The tricuspid valve is not well visualized. Tricuspid  valve regurgitation is trivial.   Aortic Valve: The aortic valve was not well visualized. Aortic valve  regurgitation is not visualized. No aortic stenosis is present. Aortic  valve mean gradient measures 4.5 mmHg. Aortic valve peak gradient measures  8.8 mmHg.   Pulmonic Valve: The pulmonic valve was not well visualized. Pulmonic valve  regurgitation not well assessed.   Aorta: The aortic root was not well visualized.   Pulmonary Artery: The pulmonary artery is not well seen.   IAS/Shunts: The interatrial septum was not well visualized.       Diastology  LV e' medial:  5.00 cm/s  LV E/e' medial: 17.5  LV e' lateral:  5.33 cm/s  LV E/e' lateral: 16.4     RIGHT VENTRICLE  RV Basal diam: 4.27 cm  RV S prime:   14.50 cm/s   LEFT ATRIUM       Index    RIGHT ATRIUM      Index  LA diam:    4.20 cm 1.70 cm/m RA Area:   17.20 cm  LA Vol (A2C):  76.7 ml 31.07 ml/m RA Volume:  43.30 ml 17.54 ml/m  LA Vol (A4C):  53.1 ml 21.51 ml/m  LA Biplane Vol: 63.2 ml 25.60 ml/m  AORTIC VALVE  AV Vmax:      148.50 cm/s  AV Vmean:     103.000 cm/s  AV VTI:      0.334 m  AV Peak Grad:   8.8 mmHg  AV Mean Grad:   4.5  mmHg  LVOT Vmax:     94.00 cm/s  LVOT Vmean:    67.300 cm/s  LVOT VTI:      0.237 m  LVOT/AV VTI ratio: 0.71   MITRAL VALVE        TRICUSPID VALVE  MV Area (PHT): 3.21 cm   TR Peak grad:  17.6 mmHg  MV Decel Time: 236 msec   TR Vmax:    210.00 cm/s  MV E velocity: 87.40 cm/s  MV A velocity: 103.00 cm/s SHUNTS  MV E/A ratio: 0.85     Systemic VTI: 0.24 m   Nelva Bush MD  Electronically signed by Nelva Bush MD  Signature Date/Time: 06/05/2020/3:38:50 PM      Final    Results for orders placed or performed in visit on 08/22/20  POCT glycosylated hemoglobin (Hb A1C)  Result Value Ref Range   Hemoglobin A1C 5.5 4.0 - 5.6 %      Assessment & Plan:   Problem List Items Addressed This Visit    Type 2 diabetes mellitus with stage 4 chronic kidney disease, without long-term current use of insulin (HCC)   Relevant Orders   POCT glycosylated hemoglobin (Hb A1C) (Completed)   Parkinsonism (HCC)   OSA on CPAP   Morbid obesity with body mass index of 40.0-44.9 in adult [facility])   Lymphedema   Lewy body dementia without behavioral disturbance ([place]) - Primary   History of DVT (deep vein thrombosis)   Heart failure with preserved ejection fraction, borderline, class II (HCC)   Relevant Medications   torsemide (DEMADEX) 20 MG tablet   Other Relevant Orders   Ambulatory referral to Cardiology   Epilepsy ([place])   CKD (chronic kidney disease), stage IV ([place])   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)   Relevant Medications   torsemide (DEMADEX) 20 MG tablet   Benign hypertension with CKD (chronic kidney disease) stage IV (HCC)   Relevant Medications   torsemide (DEMADEX) 20 MG tablet   Other Relevant Orders   Ambulatory referral to Cardiology   Anemia due to stage 4 chronic kidney disease ([place])    Other Visit Diagnoses    Peripheral vascular disease ([place])   (Chronic)     Relevant Medications   torsemide (DEMADEX) 20 MG tablet   Other Relevant Orders   Ambulatory referral to  Cardiology   Dysphagia, unspecified type         Clinically patient with multiple chronic medical problems listed above. His medical conditions are chronic and progressive, with recent continued decline in Cognitive Function with worsening Dementia, and associated Lewy Body / Parkinsons features. - Additionally secondary issues with Anxiety, Hallucinations, Seizures, Dysphagia  He is managed by [facility] Neurology. Several medications tried/failed in past, see HPI. Currently managed on Keppra for seizures. He has been tried on various meds for cognition/dementia with limited success. Has had recent Neuropsych eval. Difficulty in completing the assessment. He may need further [facility] Psychiatric evaluation.  It is my medical opinion that he meets criteria and qualifies for an escalation in his level of care. It is recommended that he would benefit from a [place] due to progressive decline of his cognitive function and other stated conditions. He will need 24 hour caregiver support.  He is currently followed by Palliative Care for home visits, symptom management. Their input and chronic care management team input supports the need for LTC placement.  Additional medical conditions contributing are as follows:  Diastolic (Grade II) Congestive Heart Failure / HFpEF Lymphdema CKD-IV Wheezing / Dysphagia  Patient has been followed by Pulmonology with initial evaluation, determined to not meet criteria for COPD/Asthma. Not on maintenance inhaler. He has been setup to have Swallow Study this Friday 08/24/20, due to concern of aspiration risk.  Also followed by Nephrology and is on diuretic therapy (Torsemide 20mg  daily PRN - not taking daily). Limited medication for HTN. He has not been able to take ACEi/ARB medication due to GFR / Kidney function decline.  Recent ECHO 05/2020 see above, shows CHF concern. May be contributing to his edema and breathing, however today lungs are clear,  and no wheezing except has some audible upper airway noises - not on auscultation.  Will refer patient to [facility] Cardiology for consultation on HFpEF to optimize medication and fluid management.  TB Skin Test PPD done 2/28 placed, 3/2 read, negative 7mm May need 2nd step TB Skin Test prior admit.  Will complete FL2, and Application paperwork for [facility] placement for LTC.  Signed and ready to scan to chart, fax to [facility] and pick up by patient family on 08/24/20.   Orders Placed This Encounter  Procedures  . Ambulatory referral to Cardiology    Referral Priority:   Routine    Referral Type:   Consultation    Referral Reason:   Specialty Services Required    Requested Specialty:   Cardiology    Number of Visits Requested:   1  . POCT glycosylated hemoglobin (Hb A1C)     Follow up plan: Return in about 3 months (around 11-26-2020), or if symptoms worsen or fail to improve, for dementia.   Nobie Putnam, DO [facility] 08/22/2020, 10:10 AM

## 2020-08-23 ENCOUNTER — Ambulatory Visit: Payer: Medicare Other | Admitting: Dermatology

## 2020-08-23 ENCOUNTER — Encounter: Payer: Self-pay | Admitting: Family Medicine

## 2020-08-23 DIAGNOSIS — G20 Parkinson's disease: Secondary | ICD-10-CM | POA: Insufficient documentation

## 2020-08-23 DIAGNOSIS — G20C Parkinsonism, unspecified: Secondary | ICD-10-CM | POA: Insufficient documentation

## 2020-08-23 DIAGNOSIS — I5030 Unspecified diastolic (congestive) heart failure: Secondary | ICD-10-CM | POA: Insufficient documentation

## 2020-08-24 ENCOUNTER — Other Ambulatory Visit: Payer: Self-pay

## 2020-08-24 ENCOUNTER — Ambulatory Visit
Admission: RE | Admit: 2020-08-24 | Discharge: 2020-08-24 | Disposition: A | Discharge status: Home / Self Care | Payer: Medicare Other | Source: Ambulatory Visit | Attending: Pulmonary Disease | Admitting: Pulmonary Disease

## 2020-08-24 DIAGNOSIS — R1310 Dysphagia, unspecified: Secondary | ICD-10-CM | POA: Diagnosis not present

## 2020-08-24 DIAGNOSIS — Z0389 Encounter for observation for other suspected diseases and conditions ruled out: Secondary | ICD-10-CM | POA: Diagnosis not present

## 2020-08-24 NOTE — Progress Notes (Signed)
Modified Barium Swallow Progress Note  Patient Details  Name: Justin Shannon. MRN: [id number] Date of Birth: May 14, 1941  Today's Date: 08/24/2020  Modified Barium Swallow completed.  Full report located under Chart Review in the Imaging Section.  Brief recommendations include the following:  Clinical Impression  Pt demonstrates adequate oropharyngeal abilities when consuming trials of puree, regular textures and thin liquids via cup and straw as well as whole barium tablet with thin liquids. Pt's oral phase and pharyngeal swallow were within functional limits with no penetration or aspiration observed. He is at reduced risk of aspiration when consuming POs. Education provided to pt's daughter and son-in-law. All questions answered to their satisfaction.   Swallow Evaluation Recommendations       SLP Diet Recommendations: Regular solids;Thin liquid   Liquid Administration via: Cup;Straw   Medication Administration: Whole meds with liquid   Supervision: Patient able to self feed   Compensations: Minimize environmental distractions;Slow rate;Small sips/bites   Postural Changes: Seated upright at 90 degrees   Oral Care Recommendations: Oral care BID      Lorene Klimas B. Rutherford Nail M.S., CCC-SLP, [place] Pathologist Rehabilitation Services Office [phone]   Bao Bazen Rutherford Nail 08/24/2020,2:18 PM

## 2020-08-24 NOTE — Progress Notes (Signed)
Noted  

## 2020-08-30 DIAGNOSIS — S86811D Strain of other muscle(s) and tendon(s) at lower leg level, right leg, subsequent encounter: Secondary | ICD-10-CM | POA: Diagnosis not present

## 2020-09-03 ENCOUNTER — Ambulatory Visit: Payer: Self-pay | Admitting: Pharmacist

## 2020-09-03 DIAGNOSIS — Z86718 Personal history of other venous thrombosis and embolism: Secondary | ICD-10-CM

## 2020-09-03 DIAGNOSIS — N184 Chronic kidney disease, stage 4 (severe): Secondary | ICD-10-CM

## 2020-09-03 DIAGNOSIS — I129 Hypertensive chronic kidney disease with stage 1 through stage 4 chronic kidney disease, or unspecified chronic kidney disease: Secondary | ICD-10-CM

## 2020-09-03 NOTE — Patient Instructions (Signed)
Visit Information  PATIENT GOALS: Goals Addressed   Please contact me when patient is close to meeting out of pocket expenditure requirement (4% of household income for calendar year) for Xarelto patient assistance for 2022     The patient verbalized understanding of instructions, educational materials, and care plan provided today and declined offer to receive copy of patient instructions, educational materials, and care plan.   Caregiver has been provided with contact information for CM Pharmacist and has been advised to call with any medication related questions or concerns.  Harlow Asa, PharmD, [place] [phone]

## 2020-09-03 NOTE — Chronic Care Management (AMB) (Signed)
Chronic Care Management Pharmacy Note  09/03/2020 Name:  [facility]. MRN:  [id number] DOB:  May 12, 1941  Subjective: Justin Shannon. is an 80 y.o. year old male who is a primary patient of [name], DO.  The CCM team was consulted for assistance with disease management and care coordination needs.    Engaged with patient's daughter by telephone for follow up visit in response to provider referral for pharmacy case management and/or care coordination services.   Consent to Services:  The patient was given information about Chronic Care Management services, agreed to services, and gave verbal consent prior to initiation of services.  Please see initial visit note for detailed documentation.   Patient Care Team: [name], DO as PCP - General (Family Medicine) Vanita Ingles, RN as Registered Nurse ([place]) Greg Cutter, LCSW as Social Worker (Licensed Clinical Social Worker) [place]oup, Virl Diamond, [facility] (Pharmacist) Jason Coop, NP as Nurse Practitioner (Hospice and Palliative Medicine) Marry Guan Laurice Record, MD as Consulting Physician (Orthopedic Surgery)  Recent office visits: Office Visit with PCP on 3/2. Provider"  Referred patient to [facility] for consultation on HFpEF, scheduled for 3/15  Planned to complete FL2, and Application paperwork for [facility] placement for LTC  Recent consult visits: Office Visit with Pulmonology on 2/16. Pulmonologist advised patient to discontinue use of Symbicort inhaler (as not needed)  Office Visit with Neurology on 2/15 for follow up including regarding cognitive impairment, spasticity in left arm and leg, seizure-like episodes   Hospital visits: Patient admitted to [facility] from 06/04/2020-06/06/2020 related to slurred speech.  Objective:  Lab Results  Component Value Date   HGBA1C 5.5 08/22/2020      Component Value Date/Time   CHOL 96 06/05/2020  0816   TRIG 81 06/05/2020 0816   HDL 57 06/05/2020 0816   CHOLHDL 1.7 06/05/2020 0816   VLDL 16 06/05/2020 0816   LDLCALC 23 06/05/2020 0816    Social History   Tobacco Use  Smoking Status Former Smoker  . Packs/day: 1.00  . Years: 10.00  . Pack years: 10.00  . Types: Cigarettes  . Quit date: 06/23/1968  . Years since quitting: 52.2  Smokeless Tobacco Never Used  Tobacco Comment   25 years ago quit   BP Readings from Last 3 Encounters:  08/22/20 (!) 146/62  08/08/20 (!) 122/58  07/22/20 (!) 166/99   Pulse Readings from Last 3 Encounters:  08/22/20 63  08/08/20 65  07/22/20 65   Wt Readings from Last 3 Encounters:  08/22/20 297 lb (134.7 kg)  08/08/20 297 lb (134.7 kg)  07/21/20 280 lb (127 kg)    Assessment: Review of patient past medical history, allergies, medications, health status, including review of consultants reports, laboratory and other test data, was performed as part of comprehensive evaluation and provision of chronic care management services.   SDOH:  (Social Determinants of Health) assessments and interventions performed: none   CCM Care Plan  Allergies  Allergen Reactions  . Shellfish Allergy Anaphylaxis and Other (See Comments)    gout  . Iodinated Diagnostic Agents Other (See Comments)    Chronic kidney disease 3b  . Nsaids Other (See Comments)    Chronic kidney disease 3b  . Rivastigmine Other (See Comments)    Side effect and not a true allergy    Medications Reviewed Today    Reviewed by Vella Raring, [facility] (Pharmacist) on 09/03/20 at [place] List Status: <None>  Medication Order Taking? Sig Documenting Provider Last Dose Status Informant  albuterol (VENTOLIN HFA) 108 (90 Base) MCG/ACT inhaler [id number] No Inhale 2 puffs into the lungs every 4 (four) hours as needed for wheezing. [provider] Taking Active Family Member  allopurinol (ZYLOPRIM) 100 MG tablet [id number] No Take 100 mg by mouth daily. [provider] Taking Active Family Member  atorvastatin (LIPITOR) 20 MG tablet [id number] No Take 1 tablet (20 mg total) by mouth at bedtime. [name], DO Taking Active Family Member  docusate sodium (COLACE) 100 MG capsule [id number] No Take 100 mg by mouth daily as needed for mild constipation or moderate constipation.  [provider] Taking Active Family Member  fexofenadine (ALLEGRA) 180 MG tablet [id number] No Take 180 mg by mouth daily as needed for allergies.  [provider] Taking Active Family Member  fluticasone (FLONASE) 50 MCG/ACT nasal spray [id number] No Place 2 sprays into both nostrils daily as needed for allergies.  [provider] Taking Active Family Member           Med Note Daune Perch   Fri Jun 24, 2019 11:07 PM)    levETIRAcetam (KEPPRA) 500 MG tablet [id number] No Take 500 mg by mouth 2 (two) times daily.  [provider] Taking Active Family Member  Magnesium 250 MG TABS [id number] No Take 250 mg by mouth daily. [provider] Taking Active Family Member  metoprolol succinate (TOPROL-XL) 50 MG 24 hr tablet [id number] No Take 50 mg by mouth daily.  [provider] Taking Active Family Member  Multiple Vitamin (ONE-A-DAY MENS PO) [id number] No Take 1 tablet by mouth daily.  [provider] Taking Active Family Member  torsemide (DEMADEX) 20 MG tablet [id number]  Take 20 mg by mouth daily as needed. [provider]  Active   XARELTO 10 MG TABS tablet [id number] No Take 1 tablet (10 mg total) by mouth daily before breakfast. [name], DO Taking Active           Patient Active Problem List   Diagnosis Date Noted  . Parkinsonism ([place]) 08/23/2020  . Heart failure with preserved ejection fraction, borderline, class II ([place]) 08/23/2020  . Slurred speech 06/04/2020  . CKD (chronic kidney disease) stage 3, GFR 30-59 ml/min (HCC) 06/04/2020  . Essential hypertension 06/04/2020  .  Hyperlipidemia 06/04/2020  . Lewy body dementia without behavioral disturbance ([place]) 05/21/2020  . Hyperlipidemia associated with type 2 diabetes mellitus ([place]) 05/21/2020  . Status post total right knee replacement 01/22/2020  . Anemia of chronic kidney failure, stage 3 (moderate) ([place]) 01/16/2020  . Total knee replacement status 11/02/2019  . History of total knee arthroplasty 11/02/2019  . PAD (peripheral artery disease) ([place]) 10/13/2019  . Benign prostatic hyperplasia   . Seizures ([place])   . Anemia due to stage 4 chronic kidney disease ([place]) 06/24/2019  . Generalized weakness 06/24/2019  . Anemia in chronic kidney disease ([place]) 06/24/2019  . Asthenia 06/24/2019  . Primary osteoarthritis of right knee 04/03/2019  . Use of cane as ambulatory aid 04/27/2018  . Dependence on other enabling machines and devices 04/27/2018  . Type 2 diabetes mellitus with stage 4 chronic kidney disease, without long-term current use of insulin ([place]) 06/11/2017  . Lymphedema 05/04/2016  . Chronic venous insufficiency 05/04/2016  . Toe sprain 01/24/2016  . Morbid obesity with body mass index of 40.0-44.9 in adult [facility]) 05/24/2014  . Body mass index (BMI)40.0-44.9, adult 05/24/2014  . OSA on CPAP 01/10/2014  .  CKD (chronic kidney disease), stage IV ([place]) 11/22/2013  . Allergic rhinitis 11/06/2013  . Benign hypertension with CKD (chronic kidney disease) stage IV ([place]) 11/06/2013  . Epilepsy ([place]) 11/06/2013  . Gout 11/06/2013  . History of DVT (deep vein thrombosis) 11/06/2013  . Chronic deep vein thrombosis (DVT) of proximal vein of lower extremity ([place]) 11/06/2013  . 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 Personal history of other venous thrombosis and embolism 11/06/2013    Immunization History  Administered Date(s) Administered  . Influenza Inj Mdck Quad Pf 05/03/2019  . Influenza Split 05/24/2014  . Influenza,inj,Quad  PF,6+ Mos 03/13/2020  . Influenza-Unspecified 03/31/2017, 05/03/2019  . PFIZER(Purple Top)SARS-COV-2 Vaccination 08/08/2019, 08/30/2019, 04/09/2020  . PPD Test 08/20/2020  . Pneumococcal Conjugate-13 05/23/2016  . Pneumococcal Polysaccharide-23 12/22/2010  . Zoster 01/31/2015    Conditions to be addressed/monitored: History of DVT and HTN  Care Plan : PharmD - Medication Assistance  Updates made by Vella Raring, [place] since 09/03/2020 12:00 AM    Problem: Disease Progression     Long-Range Goal: Disease Progression Prevented or Minimized Completed 09/03/2020  Start Date: 06/18/2020  Expected End Date: 09/16/2020  Recent Progress: On track  Priority: High  Note:   Current Barriers:  . Unable to independently afford treatment regimen  Pharmacist Clinical Goal(s):  Marland Kitchen Over the next 90 days, patient will verbalize ability to afford treatment regimen through collaboration with PharmD and provider.   Interventions: . 1:1 collaboration with [name], DO regarding development and update of comprehensive plan of care as evidenced by provider attestation and co-signature . Inter-disciplinary care team collaboration (see longitudinal plan of care) . Perform chart review o Patient seen by PCP on 3/2. Provider - Referred patient to [facility] for consultation on HFpEF, scheduled for 3/15 - Planned to complete FL2, and Application paperwork for [facility] placement for LTC o Office Visit with Pulmonology on 2/16. Pulmonologist advised patient to discontinue use of Symbicort inhaler (as not needed) o Office Visit with Neurology on 2/15 for follow up including regarding cognitive impairment, spasticity in left arm and leg, seizure-like episodes  . Note patient currently followed by Palliative Care for home visits  Medication Assistance . Note daughter previously reported patient denied for Extra Help through [employer]. Have counseled daughter on importance of  retaining denial letter . Caregiver previously requesting assistance with cost of inhalers for patient.  o Per review of notes from Office Visit with Pulmonology on 2/16, patient advised to discontinue use of Symbicort inhaler (as not needed) . Xarelto o Have reviewed daughter again the criteria for applying for patient assistance for Xarelto, including program 4% out of pocket expenditure requirement o Daughter confirms will follow up with CM Pharmacist when close to meeting out of pocket expenditure requirement for 2022  Hypertension/ HFpEF:  PCP has referred patient to [facility] for consultation on HFpEF, scheduled for 3/15  Reports patient taking:   metoprolol ER 50 mg daily  torsemide 20 mg daily as needed  Note patient has home automatic and manual blood pressure monitors  Have encouraged to monitor blood pressure at home, keep log and bring log to medical appointments  Daughter denies questions/concerns for CM Pharmacist with other medications at this time  Patient Goals/Self-Care Activities . Over the next 90 days, patient will:  - collaborate with provider on medication access solutions - attend medical appointments as scheduled  Follow Up Plan: Caregiver has been  provided with contact information for CM Pharmacist and advised to call when close to meeting out of pocket expenditure requirement for 2022 or for other medication questions/concerns     Medication Assistance: Daughter confirms will follow up with CM Pharmacist when close to meeting out of pocket expenditure requirement Xarelto patient assistance for 2022  [name] preferred pharmacy is:  [facility] [address]), [place])  [address] Phone: [phone] Fax: [phone]  [place], [address], [address] [place], [address] [place] [address] Phone: [phone] Fax:  [phone]  Uses pill box? Yes  Follow Up:  Patient agrees to Care Plan and Follow-up.  Plan: Caregiver has been provided with contact information for CM Pharmacist and has been advised to call with any medication related questions or concerns.   Harlow Asa, PharmD, [place] [phone]

## 2020-09-04 ENCOUNTER — Ambulatory Visit: Payer: Medicare Other | Admitting: Cardiology

## 2020-09-04 ENCOUNTER — Other Ambulatory Visit: Payer: Self-pay

## 2020-09-04 ENCOUNTER — Encounter: Payer: Self-pay | Admitting: Cardiology

## 2020-09-04 VITALS — BP 146/82 | HR 58 | Ht 72.0 in | Wt 301.0 lb

## 2020-09-04 DIAGNOSIS — I10 Essential (primary) hypertension: Secondary | ICD-10-CM

## 2020-09-04 DIAGNOSIS — I5189 Other ill-defined heart diseases: Secondary | ICD-10-CM | POA: Diagnosis not present

## 2020-09-04 MED ORDER — TORSEMIDE 20 MG PO TABS: ORAL_TABLET | ORAL | 3 refills | Status: DC

## 2020-09-04 NOTE — Progress Notes (Signed)
Cardiology Office Note:    Date:  09/04/2020   ID:  Justin Shannon., DOB 02-02-1941, MRN [id number]  PCP:  Karamalegos, Alexander J, [place]  Cardiologist:  Kate Sable, MD  Advanced Practice Provider:  No care team member to display Electrophysiologist:  None       Referring MD: Nobie Putnam *   Chief Complaint  Patient presents with  . New Patient (Initial Visit)    Referred by PCP for PVD, HTN, and HF    History of Present Illness:    Justin Shannon. is a 80 y.o. male with a hx of CKD, hypertension, dementia, DVT who presents due to abnormal echocardiogram and edema.  Patient with history of cognitive decline attributed to Lewy body dementia for which she sees neurology.  Edema noted on exam which prompted echocardiogram to be performed on 05/2020.  Echocardiogram showed preserved ejection fraction, EF over 35%, grade 2 diastolic dysfunction.    Patient was previously placed on torsemide, this was stopped after he developed dehydration and ended up in the emergency room.  He takes torsemide as needed for edema.  Wears compression stockings which has helped with his lower extremity swelling.  Has a history of right lower extremity DVT, currently on Xarelto, monitored by both PCP and vascular surgery.    Previously on multiple antihypertensives, low blood pressures were noticed with systolic in the 57D, BP medications were stopped.  Currently takes Toprol-XL only.  Past Medical History:  Diagnosis Date  . Arthritis   . Bilateral swelling of feet   . BPH (benign prostatic hyperplasia)   . Chronic kidney disease   . DVT (deep venous thrombosis) (HCC)    right lower extremity  . History of gout   . Seizures ([place])   . Sleep apnea   . Tendonitis of foot    left    Past Surgical History:  Procedure Laterality Date  . APPENDECTOMY    . APPLICATION OF WOUND VAC Right 11/02/2019   Procedure: APPLICATION OF WOUND VAC;   Surgeon: Dereck Leep, MD;  Location: [facility] ORS;  Service: Orthopedics;  Laterality: Right;  [id number]  . HERNIA REPAIR     UMBILICAL  . IVC FILTER INSERTION N/A 10/25/2019   Procedure: IVC FILTER INSERTION;  Surgeon: Katha Cabal, MD;  Location: [facility];  Service: Cardiovascular;  Laterality: N/A;  . KNEE ARTHROPLASTY Right 11/02/2019   Procedure: COMPUTER ASSISTED TOTAL KNEE ARTHROPLASTY;  Surgeon: Dereck Leep, MD;  Location: [facility] ORS;  Service: Orthopedics;  Laterality: Right;  . PATELLAR TENDON REPAIR Right 12/05/2019   Procedure: PATELLA TENDON REPAIR;  Surgeon: Dereck Leep, MD;  Location: [facility] ORS;  Service: Orthopedics;  Laterality: Right;  . PROSTATE SURGERY      Current Medications: Current Meds  Medication Sig  . albuterol (VENTOLIN HFA) 108 (90 Base) MCG/ACT inhaler Inhale 2 puffs into the lungs every 4 (four) hours as needed for wheezing.  Marland Kitchen allopurinol (ZYLOPRIM) 100 MG tablet Take 100 mg by mouth daily.  Marland Kitchen atorvastatin (LIPITOR) 20 MG tablet Take 1 tablet (20 mg total) by mouth at bedtime.  . docusate sodium (COLACE) 100 MG capsule Take 100 mg by mouth daily as needed for mild constipation or moderate constipation.   . fexofenadine (ALLEGRA) 180 MG tablet Take 180 mg by mouth daily as needed for allergies.   . fluticasone (FLONASE) 50 MCG/ACT nasal spray Place 2 sprays into both nostrils daily as needed  for allergies.   Marland Kitchen levETIRAcetam (KEPPRA) 500 MG tablet Take 500 mg by mouth 2 (two) times daily.   . Magnesium 250 MG TABS Take 250 mg by mouth daily.  . metoprolol succinate (TOPROL-XL) 50 MG 24 hr tablet Take 50 mg by mouth daily.   . Multiple Vitamin (ONE-A-DAY MENS PO) Take 1 tablet by mouth daily.   Alveda Reasons 10 MG TABS tablet Take 1 tablet (10 mg total) by mouth daily before breakfast.  . [DISCONTINUED] torsemide (DEMADEX) 20 MG tablet Take 20 mg by mouth daily as needed.     Allergies:   Shellfish allergy, Iodinated diagnostic agents, Nsaids,  and Rivastigmine   Social History   Socioeconomic History  . Marital status: Married    Spouse name: Not on file  . Number of children: Not on file  . Years of education: Not on file  . Highest education level: Not on file  Occupational History  . Not on file  Tobacco Use  . Smoking status: Former Smoker    Packs/day: 1.00    Years: 10.00    Pack years: 10.00    Types: Cigarettes    Quit date: 06/23/1968    Years since quitting: 52.2  . Smokeless tobacco: Never Used  . Tobacco comment: 25 years ago quit  Vaping Use  . Vaping Use: Never used  Substance and Sexual Activity  . Alcohol use: No  . Drug use: No  . Sexual activity: Not on file  Other Topics Concern  . Not on file  Social History Narrative  . Not on file   Social Determinants of Health   Financial Resource Strain: Low Risk   . Difficulty of Paying Living Expenses: Not hard at all  Food Insecurity: No Food Insecurity  . Worried About Charity fundraiser in the Last Year: Never true  . Ran Out of Food in the Last Year: Never true  Transportation Needs: No Transportation Needs  . Lack of Transportation (Medical): No  . Lack of Transportation (Non-Medical): No  Physical Activity: Inactive  . Days of Exercise per Week: 0 days  . Minutes of Exercise per Session: 0 min  Stress: No Stress Concern Present  . Feeling of Stress : Only a little  Social Connections: Moderately Isolated  . Frequency of Communication with Friends and Family: More than three times a week  . Frequency of Social Gatherings with Friends and Family: More than three times a week  . Attends Religious Services: Never  . Active Member of Clubs or Organizations: No  . Attends Archivist Meetings: Not on file  . Marital Status: Married     Family History: The patient's family history includes Arthritis in his mother; Diabetes in his brother; Hypertension in his mother.  ROS:   Please see the history of present illness.     All  other systems reviewed and are negative.  EKGs/Labs/Other Studies Reviewed:    The following studies were reviewed today:   EKG:  EKG is  ordered today.  The ekg ordered today demonstrates sinus bradycardia, otherwise normal ECG, heart rate 58  Recent Labs: 07/21/2020: ALT 30 07/22/2020: BUN 30; Creatinine, Ser 1.80; Hemoglobin 10.1; Platelets 186; Potassium 5.2; Sodium 139  Recent Lipid Panel    Component Value Date/Time   CHOL 96 06/05/2020 0816   TRIG 81 06/05/2020 0816   HDL 57 06/05/2020 0816   CHOLHDL 1.7 06/05/2020 0816   VLDL 16 06/05/2020 0816   LDLCALC 23 06/05/2020 0816  Risk Assessment/Calculations:      Physical Exam:    VS:  BP (!) 146/82   Pulse (!) 58   Ht 6' (1.829 m)   Wt (!) 301 lb (136.5 kg)   BMI 40.82 kg/m     Wt Readings from Last 3 Encounters:  09/04/20 (!) 301 lb (136.5 kg)  08/22/20 297 lb (134.7 kg)  08/08/20 297 lb (134.7 kg)     GEN:  Well nourished, well developed in no acute distress HEENT: Normal NECK: No JVD; No carotid bruits LYMPHATICS: No lymphadenopathy CARDIAC: RRR, no murmurs, rubs, gallops RESPIRATORY: Decreased breath sounds at bases ABDOMEN: Soft, non-tender, non-distended MUSCULOSKELETAL: 1-2+ edema; No deformity  SKIN: Warm and dry NEUROLOGIC:  Alert and oriented x 3 PSYCHIATRIC:  Normal affect   ASSESSMENT:    1. Diastolic dysfunction   2. Primary hypertension    PLAN:    In order of problems listed above:  1. Grade 2 diastolic dysfunction, edema noted on exam.  Start torsemide 20 mg 4 days a week (MWF Sat).  Check BMP in 2 weeks at nephrology appointment.  Avoiding Aldactone due to previous hyperkalemia.  Continue medical management.  Will not recommend any additional invasive cardiac testing in this gentleman due to significant comorbidities including decline in function, dementia requiring 24-hour care. 2. Hypertension, BP okay based on patient's comorbidities.  Continue Toprol-XL, torsemide as above.   If BP decreases, consider decreasing Toprol-XL.  Follow-up in 2 months     Medication Adjustments/Labs and Tests Ordered: Current medicines are reviewed at length with the patient today.  Concerns regarding medicines are outlined above.  Orders Placed This Encounter  Procedures  . EKG 12-Lead   Meds ordered this encounter  Medications  . torsemide (DEMADEX) 20 MG tablet    Sig: Take 1 tab by mouth every Mon/Wed/Fri/Sat (4 days a week)    Dispense:  35 tablet    Refill:  3    Patient Instructions  Medication Instructions:   Your physician has recommended you make the following change in your medication:   1.  TAKE your Torsemide 20 MG on: Mon/Wed/Fri/Sat (4 days a week)   *If you need a refill on your cardiac medications before your next appointment, please call your pharmacy*   Lab Work:  Check Renal function at next Nephrology appointment.     Testing/Procedures:  None Ordered   Follow-Up: At [facility], you and your health needs are our priority.  As part of our continuing mission to provide you with exceptional heart care, we have created designated Provider Care Teams.  These Care Teams include your primary Cardiologist (physician) and Advanced Practice Providers (APPs -  Physician Assistants and Nurse Practitioners) who all work together to provide you with the care you need, when you need it.  We recommend signing up for the patient portal called "MyChart".  Sign up information is provided on this After Visit Summary.  MyChart is used to connect with patients for Virtual Visits (Telemedicine).  Patients are able to view lab/test results, encounter notes, upcoming appointments, etc.  Non-urgent messages can be sent to your provider as well.   To learn more about what you can do with MyChart, go to [email or web address].    Your next appointment:   2 month(s)  The format for your next appointment:   In Person  Provider:   Kate Sable,  MD   Other Instructions      Signed, Kate Sable, MD  09/04/2020 10:43 AM  [facility]

## 2020-09-04 NOTE — Patient Instructions (Signed)
Medication Instructions:   Your physician has recommended you make the following change in your medication:   1.  TAKE your Torsemide 20 MG on: Mon/Wed/Fri/Sat (4 days a week)   *If you need a refill on your cardiac medications before your next appointment, please call your pharmacy*   Lab Work:  Check Renal function at next Nephrology appointment.     Testing/Procedures:  None Ordered   Follow-Up: At [facility], you and your health needs are our priority.  As part of our continuing mission to provide you with exceptional heart care, we have created designated Provider Care Teams.  These Care Teams include your primary Cardiologist (physician) and Advanced Practice Providers (APPs -  Physician Assistants and Nurse Practitioners) who all work together to provide you with the care you need, when you need it.  We recommend signing up for the patient portal called "MyChart".  Sign up information is provided on this After Visit Summary.  MyChart is used to connect with patients for Virtual Visits (Telemedicine).  Patients are able to view lab/test results, encounter notes, upcoming appointments, etc.  Non-urgent messages can be sent to your provider as well.   To learn more about what you can do with MyChart, go to [email or web address].    Your next appointment:   2 month(s)  The format for your next appointment:   In Person  Provider:   Kate Sable, MD   Other Instructions

## 2020-09-06 ENCOUNTER — Other Ambulatory Visit: Payer: Medicare Other | Admitting: Primary Care

## 2020-09-13 ENCOUNTER — Telehealth: Payer: Self-pay

## 2020-09-18 ENCOUNTER — Encounter: Payer: Self-pay | Admitting: Pulmonary Disease

## 2020-09-18 ENCOUNTER — Ambulatory Visit: Payer: Medicare Other | Admitting: Licensed Clinical Social Worker

## 2020-09-18 ENCOUNTER — Ambulatory Visit: Payer: Medicare Other | Admitting: Pulmonary Disease

## 2020-09-18 ENCOUNTER — Other Ambulatory Visit: Payer: Self-pay

## 2020-09-18 VITALS — BP 144/90 | HR 63 | Temp 97.5°F | Ht 72.0 in | Wt 301.0 lb

## 2020-09-18 DIAGNOSIS — G3183 Dementia with Lewy bodies: Secondary | ICD-10-CM

## 2020-09-18 DIAGNOSIS — I129 Hypertensive chronic kidney disease with stage 1 through stage 4 chronic kidney disease, or unspecified chronic kidney disease: Secondary | ICD-10-CM | POA: Diagnosis not present

## 2020-09-18 DIAGNOSIS — N184 Chronic kidney disease, stage 4 (severe): Secondary | ICD-10-CM

## 2020-09-18 DIAGNOSIS — Z9989 Dependence on other enabling machines and devices: Secondary | ICD-10-CM | POA: Diagnosis not present

## 2020-09-18 DIAGNOSIS — I5030 Unspecified diastolic (congestive) heart failure: Secondary | ICD-10-CM | POA: Diagnosis not present

## 2020-09-18 DIAGNOSIS — R0602 Shortness of breath: Secondary | ICD-10-CM

## 2020-09-18 DIAGNOSIS — E1122 Type 2 diabetes mellitus with diabetic chronic kidney disease: Secondary | ICD-10-CM

## 2020-09-18 DIAGNOSIS — R1310 Dysphagia, unspecified: Secondary | ICD-10-CM

## 2020-09-18 DIAGNOSIS — G4733 Obstructive sleep apnea (adult) (pediatric): Secondary | ICD-10-CM | POA: Diagnosis not present

## 2020-09-18 DIAGNOSIS — F0280 Dementia in other diseases classified elsewhere without behavioral disturbance: Secondary | ICD-10-CM

## 2020-09-18 DIAGNOSIS — I5189 Other ill-defined heart diseases: Secondary | ICD-10-CM

## 2020-09-18 DIAGNOSIS — G20 Parkinson's disease: Secondary | ICD-10-CM | POA: Diagnosis not present

## 2020-09-18 NOTE — Patient Instructions (Signed)
Licensed Clinical Social Worker Visit Information  Goals we discussed today:  Goals Addressed            This Visit's Progress   . Keeping Myself/Loved One Safe-Dementia        Timeframe:  Long-Range Goal Priority:  Medium Start Date: 08/21/20                   Expected End Date: 11/21/20                  Follow Up Date-10/17/20   - avoid busy places that are confusing such as the shopping malls - check hot water heater temperature, turn down if too hot - get rid of poisonous plants - keep medicine where it is safe or locked - keep power tools, knives and cleaning products in a safe place - put deadbolts either high or low on outside doors - put car keys out of sight - remove knobs from stove and oven - remove vitamins, medicine, sugar substitutes and seasonings from the kitchen table and counters - supervise the use of tobacco and alcohol - use a GPS device in the car - use an audio or video monitor - use appliances that have an auto shut-off feature - use motion-sensor alarms outdoors, in the bedroom and the kitchen - use seat cushions, floor mats and bed pads that are wired to alarm when getting up    Why is this important?    Safety is important when you or your loved one has dementia.   Eyesight, hearing and changes in feelings of hot and cold or depth-perception may occur.   Wandering or getting lost may happen.   You/your loved one may have to stop driving.   Taking steps to improve safety can prevent injuries.   It will also help you/your loved one feel more relaxed.   It will help you/your loved one be independent for a longer time.     Assessment, press & current barriers:  Patient unable to consistently perform activities of daily living and needs additional assistance and support in order to meet this unmet need . Limited social support, ADL IADL limitations, Social Isolation, Limited access to caregiver, Cognitive Deficits, Memory Deficits, Inability to  perform ADL's independently, and Inability to perform IADL's independently . Unable to self administer medications as prescribed . Lacks social connections  Clinical Goals:   Over the next 120 days, patient/caregiver will work with SW to address concerns related to care coordination needs, lack of a stable support network and lack of Brewing technologist. LCSW will assist patient in gaining additional support in order to maintain health and mental health appropriately   Over the next 120 days, patient will demonstrate improved adherence to self care as evidenced by implementing healthy self-care into his daily routine such as: attending all medical appointments, deep breathing exercises, taking time for self-reflection, taking medications as prescribed, drinking water and daily exercise to improve mobility and mood.   Over the next 120 days, patient will demonstrate improved health management independence as evidenced by implementing healthy self-care skills and positive support/resource implementation into their daily routine to help cope with stressors and improve overall health and well-being   Over the next 120 days, patient or caregiver will verbalize basic understanding of depression/stress process and self health management plan as evidenced by his participation in development of long term plan of care and institution of self health management strategies  Interventions : . Assessed needs,  level of care concerns, basic eligibility and provided education on the entire LTC placement process.  . Daughter reports that patient is actively involved with [facility]. A nurse comes out twice per week and the aide comes three times per week.  . Patient has Lewy Body Dementia and is in need of a higher level of care/support. Family prefers LTC placement at this time. Reviewed community support options ( CAP, private pay, PACE program) . Patient was informed that  current CCM LCSW will be leaving position next month and her next CCM Social Work follow up visit will be with another LCSW. Patient was appreciative of support provided and receptive to news . Will collaborate with primary care provider regarding LTC placement. PCP is aware that several facilities have declined patient for placement due to his chronic health care needs.  . Daughter reports that they received a foyer lift because patient is unable to use his left side of the body and is unable to ambulate out of the bed. . Daughter reports that family applied for Special Assistance Medicaid but several facilities with memory care that they were looking into were only able to accept patients under private pay for 3 years until they would accept him for Medicaid. UPDATE- Daughter is going to apply for long term care Medicaid (for skilled nursing) today on 07/24/20. Daughter was advised to contact DSS to advise them to disregard the Special Assistance Medicaid application but to consider the LTC application. Daughter will contact DSS today. Daughter has been talking with the admissions coordinator at [facility] regarding their long term care program. UPDATE 08/21/20- Daughter reports that she successfully received a reference number for Medicaid and will write it down to provide to [facility]. [facility] has a wait list for Medicaid beds but family still wishes to attempt placement. Patient will complete office visit with PCP tomorrow and will complete FL2 and PPD. Family will turn in financial application to [facility] and then they will review his case for enrollment. Family does NOT want patient going to either [facility] or [facility]. [facility] Patient will complete [facility]- waiting on FL2, PPD. Turn in financial application. Review and do their assessment.. Is a wait list. skilled. [facility] not want [facility]. [facility] not [facility] him. Per daughter, [facility] didn't have enough staff to accept patient either. [facility] no longer has memory care. Daughter even called several facilities in [place] but they would not accept patient due to his chronic disease management needs. CCM LCSW advised daughter to contact [facility] [place] as they have SNF. CCM LCSW sent daughter a list of Skilled Nursing Facilities within [place], [place]. However, [place] social worker has already been unable to secure SNF placement for patient as several facilities declined him as well in [facility]. UPDATE 09/18/20- Patient was declined for [place] Medicaid because of his assets. Daughter reports that she never heard back from several assisted living facilities after they received patient's FL2 form. However, family report that patient is doing a lot better physically and mentally. [name] hallucinations have decreased. Daughter reports that patient has more energy and is more alert. Patient is even helping the family out when he can.  . HH ended because patient was not making progress with therapy.  . Email sent to daughter with a list of long term care facilities within her area.  . Patient interviewed and  appropriate assessments performed . Referred patient in the past to community resource care guide team for assistance with in home support through [facility] . Provided mental health counseling with regard to caregiver strain . Discussed plans with patient for ongoing care management follow up and provided patient with direct contact information for care management team . Assisted patient/caregiver with obtaining information about health plan benefits . Provided education and assistance to client regarding Advanced Directives. . Provided education to patient/caregiver regarding level of care options. . Provided education to patient/caregiver about Hospice and/or Palliative Care services . Patient had a right knee replacement in May of  2021. Marland Kitchen Patient completed pulmonary appointment today on 09/18/20 and physician said that his lungs looked good and clear.  . Other interventions provided: Solution-Focused Strategies   Follow Up Plan:  . SW will follow up with patient by phone over the next month        Eula Fried, [place], MSW, [email or web address] Phone: [phone]

## 2020-09-18 NOTE — Chronic Care Management (AMB) (Signed)
Chronic Care Shannon    Clinical Social Work Note  09/18/2020 Name: Justin Shannon. MRN: [id number] DOB: 1940-11-12  Justin Shannon. is a 80 y.o. year old male who is a primary care patient of Justin Hauser, DO. The CCM team was consulted to assist the patient with chronic disease Shannon and/or care coordination needs related to: Level of Care Concerns, Mental Health Counseling and Resources and Caregiver Stress.   Collaboration with daughter for follow up visit in response to provider referral for social work chronic care Shannon and care coordination services.   Consent to Services:  The patient was given the following information about Chronic Care Shannon services today, agreed to services, and gave verbal consent: 1. CCM service includes personalized support from designated clinical staff supervised by the primary care provider, including individualized plan of care and coordination with other care providers 2. 24/7 contact phone numbers for assistance for urgent and routine care needs. 3. Service will only be billed when office clinical staff spend 20 minutes or more in a month to coordinate care. 4. Only one practitioner may furnish and bill the service in a calendar month. 5.The patient may stop CCM services at any time (effective at the end of the month) by phone call to the office staff. 6. The patient will be responsible for cost sharing (co-pay) of up to 20% of the service fee (after annual deductible is met). Patient agreed to services and consent obtained.  Patient agreed to services and consent obtained.   Assessment: Review of patient past medical history, allergies, medications, and health status, including review of relevant consultants reports was performed today as part of a comprehensive evaluation and provision of chronic care Shannon and care coordination services.     SDOH (Social Determinants of Health) assessments and interventions performed:     Advanced Directives Status: See Care Plan for related entries.  CCM Care Plan  Allergies  Allergen Reactions  . Shellfish Allergy Anaphylaxis and Other (See Comments)    gout  . Iodinated Diagnostic Agents Other (See Comments)    Chronic kidney disease 3b  . Nsaids Other (See Comments)    Chronic kidney disease 3b  . Rivastigmine Other (See Comments)    Side effect and not a true allergy    Outpatient Encounter Medications as of 09/18/2020  Medication Sig  . albuterol (VENTOLIN HFA) 108 (90 Base) MCG/ACT inhaler Inhale 2 puffs into the lungs every 4 (four) hours as needed for wheezing.  Marland Kitchen allopurinol (ZYLOPRIM) 100 MG tablet Take 100 mg by mouth daily.  Marland Kitchen atorvastatin (LIPITOR) 20 MG tablet Take 1 tablet (20 mg total) by mouth at bedtime.  . docusate sodium (COLACE) 100 MG capsule Take 100 mg by mouth daily as needed for mild constipation or moderate constipation.   . fexofenadine (ALLEGRA) 180 MG tablet Take 180 mg by mouth daily as needed for allergies.   . fluticasone (FLONASE) 50 MCG/ACT nasal spray Place 2 sprays into both nostrils daily as needed for allergies.   Marland Kitchen levETIRAcetam (KEPPRA) 500 MG tablet Take 500 mg by mouth 2 (two) times daily.   . Magnesium 250 MG TABS Take 250 mg by mouth daily.  . metoprolol succinate (TOPROL-XL) 50 MG 24 hr tablet Take 50 mg by mouth daily.   . Multiple Vitamin (ONE-A-DAY MENS PO) Take 1 tablet by mouth daily.   Marland Kitchen torsemide (DEMADEX) 20 MG tablet Take 1 tab by mouth every Mon/Wed/Fri/Sat (4 days a week)  . XARELTO 10 MG  TABS tablet Take 1 tablet (10 mg total) by mouth daily before breakfast.   No facility-administered encounter medications on file as of 09/18/2020.    Patient Active Problem List   Diagnosis Date Noted  . Parkinsonism ([place]) 08/23/2020  . Heart failure with preserved ejection fraction, borderline, class II ([place]) 08/23/2020  . Slurred speech 06/04/2020  . CKD (chronic kidney disease) stage 3, GFR 30-59 ml/min (HCC)  06/04/2020  . Essential hypertension 06/04/2020  . Hyperlipidemia 06/04/2020  . Lewy body dementia without behavioral disturbance ([place]) 05/21/2020  . Hyperlipidemia associated with type 2 diabetes mellitus ([place]) 05/21/2020  . Status post total right knee replacement 01/22/2020  . Anemia of chronic kidney failure, stage 3 (moderate) ([place]) 01/16/2020  . Total knee replacement status 11/02/2019  . History of total knee arthroplasty 11/02/2019  . PAD (peripheral artery disease) ([place]) 10/13/2019  . Benign prostatic hyperplasia   . Seizures ([place])   . Anemia due to stage 4 chronic kidney disease ([place]) 06/24/2019  . Generalized weakness 06/24/2019  . Anemia in chronic kidney disease ([facility]) 06/24/2019  . Asthenia 06/24/2019  . Primary osteoarthritis of right knee 04/03/2019  . Use of cane as ambulatory aid 04/27/2018  . Dependence on other enabling machines and devices 04/27/2018  . Type 2 diabetes mellitus with stage 4 chronic kidney disease, without long-term current use of insulin ([place]) 06/11/2017  . Lymphedema 05/04/2016  . Chronic venous insufficiency 05/04/2016  . Toe sprain 01/24/2016  . Morbid obesity with body mass index of 40.0-44.9 in adult [facility]) 05/24/2014  . Body mass index (BMI)40.0-44.9, adult 05/24/2014  . OSA on CPAP 01/10/2014  . CKD (chronic kidney disease), stage IV ([place]) 11/22/2013  . Allergic rhinitis 11/06/2013  . Benign hypertension with CKD (chronic kidney disease) stage IV ([place]) 11/06/2013  . Epilepsy ([place]) 11/06/2013  . Gout 11/06/2013  . History of DVT (deep vein thrombosis) 11/06/2013  . Chronic deep vein thrombosis (DVT) of proximal vein of lower extremity ([place]) 11/06/2013  . 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 Personal history of other venous thrombosis and embolism 11/06/2013   Conditions to be addressed/monitored: Dementia; Limited social support, Level of care concerns, ADL  IADL limitations and Limited access to caregiver  Care Plan : General Social Work (Adult)  Updates made by Greg Cutter, LCSW since 09/18/2020 12:00 AM    Problem: Caregiver Stress     Long-Range Goal: Secure LTC placement   Start Date: 08/21/2020  Priority: Medium  Note:    Timeframe:  Long-Range Goal Priority:  Medium Start Date: 08/21/20                   Expected End Date: 11/21/20                  Follow Up Date-10/17/20   - avoid busy places that are confusing such as the shopping malls - check hot water heater temperature, turn down if too hot - get rid of poisonous plants - keep medicine where it is safe or locked - keep power tools, knives and cleaning products in a safe place - put deadbolts either high or low on outside doors - put car keys out of sight - remove knobs from stove and oven - remove vitamins, medicine, sugar substitutes and seasonings from the kitchen table and counters - supervise the use of tobacco and alcohol - use a GPS device in the car - use an audio  or video monitor - use appliances that have an auto shut-off feature - use motion-sensor alarms outdoors, in the bedroom and the kitchen - use seat cushions, floor mats and bed pads that are wired to alarm when getting up    Why is this important?    Safety is important when you or your loved one has dementia.   Eyesight, hearing and changes in feelings of hot and cold or depth-perception may occur.   Wandering or getting lost may happen.   You/your loved one may have to stop driving.   Taking steps to improve safety can prevent injuries.   It will also help you/your loved one feel more relaxed.   It will help you/your loved one be independent for a longer time.     Assessment, press & current barriers:  Patient unable to consistently perform activities of daily living and needs additional assistance and support in order to meet this unmet need . Limited social support, ADL IADL limitations,  Social Isolation, Limited access to caregiver, Cognitive Deficits, Memory Deficits, Inability to perform ADL's independently, and Inability to perform IADL's independently . Unable to self administer medications as prescribed . Lacks social connections  Clinical Goals:   Over the next 120 days, patient/caregiver will work with SW to address concerns related to care coordination needs, lack of a stable support network and lack of Brewing technologist. LCSW will assist patient in gaining additional support in order to maintain health and mental health appropriately   Over the next 120 days, patient will demonstrate improved adherence to self care as evidenced by implementing healthy self-care into his daily routine such as: attending all medical appointments, deep breathing exercises, taking time for self-reflection, taking medications as prescribed, drinking water and daily exercise to improve mobility and mood.   Over the next 120 days, patient will demonstrate improved health Shannon independence as evidenced by implementing healthy self-care skills and positive support/resource implementation into their daily routine to help cope with stressors and improve overall health and well-being   Over the next 120 days, patient or caregiver will verbalize basic understanding of depression/stress process and self health Shannon plan as evidenced by his participation in development of long term plan of care and institution of self health Shannon strategies  Interventions : . Assessed needs, level of care concerns, basic eligibility and provided education on the entire LTC placement process.  . Daughter reports that patient is actively involved with [facility]. A nurse comes out twice per week and the aide comes three times per week.  . Patient has Lewy Body Dementia and is in need of a higher level of care/support. Family prefers LTC placement at this time.  Reviewed community support options ( CAP, private pay, PACE program) . Patient was informed that current CCM LCSW will be leaving position next month and her next CCM Social Work follow up visit will be with another LCSW. Patient was appreciative of support provided and receptive to news . Will collaborate with primary care provider regarding LTC placement. PCP is aware that several facilities have declined patient for placement due to his chronic health care needs.  . Daughter reports that they received a foyer lift because patient is unable to use his left side of the body and is unable to ambulate out of the bed. . Daughter reports that family applied for Special Assistance Medicaid but several facilities with memory care that they were looking into were only able to accept patients under  private pay for 3 years until they would accept him for Medicaid. UPDATE- Daughter is going to apply for long term care Medicaid (for skilled nursing) today on 07/24/20. Daughter was advised to contact DSS to advise them to disregard the Special Assistance Medicaid application but to consider the LTC application. Daughter will contact DSS today. Daughter has been talking with the admissions coordinator at [facility] regarding their long term care program. UPDATE 08/21/20- Daughter reports that she successfully received a reference number for Medicaid and will write it down to provide to [facility]. [facility] has a wait list for Medicaid beds but family still wishes to attempt placement. Patient will complete office visit with PCP tomorrow and will complete FL2 and PPD. Family will turn in financial application to [facility] and then they will review his case for enrollment. Family does NOT want patient going to either [facility] or [facility]. [facility] Patient will complete [facility]- waiting on FL2, PPD. Turn in financial application. Review and do their assessment.. Is a wait list. skilled. [facility] not want [facility]. [facility] not [facility] him. Per daughter, [facility] didn't have enough staff to accept patient either. [facility] no longer has memory care. Daughter even called several facilities in [facility] but they would not accept patient due to his chronic disease Shannon needs. CCM LCSW advised daughter to contact [facility] [place] as they have SNF. CCM LCSW sent daughter a list of Skilled Nursing Facilities within [place], [place]. However, [place] social worker has already been unable to secure SNF placement for patient as several facilities declined him as well in [facility]. UPDATE 09/18/20- Patient was declined for [place] Medicaid because of his assets. Daughter reports that she never heard back from several assisted living facilities after they received patient's FL2 form. However, family report that patient is doing a lot better physically and mentally. [name] hallucinations have decreased. Daughter reports that patient has more energy and is more alert. Patient is even helping the family out when he can.  . HH ended because patient was not making progress with therapy.  . Email sent to daughter with a list of long term care facilities within her area.  . Patient interviewed and appropriate assessments performed . Referred patient in the past to community resource care guide team for assistance with in home support through [facility] . Provided mental health counseling with regard to caregiver strain . Discussed plans with patient for ongoing care Shannon follow up and provided patient with direct contact information for care Shannon team . Assisted patient/caregiver with obtaining information about health plan benefits . Provided education and assistance to client regarding Advanced Directives. . Provided education to patient/caregiver regarding level of care options. . Provided education to patient/caregiver  about Hospice and/or Palliative Care services . 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 Marland Kitchen Patient completed pulmonary appointment today on 09/18/20 and physician said that his lungs looked good and clear.  . Other interventions provided: Solution-Focused Strategies   Follow Up Plan:  . SW will follow up with patient by phone over the next month       Follow Up Plan: SW will follow up with patient by phone over the next month.       Eula Fried, BSW, MSW, [email or web address] Phone: [phone]

## 2020-09-18 NOTE — Progress Notes (Signed)
Subjective:    [name], male    DOB: May 20, 1941, 80 y.o.   MRN: [id number] Chief Complaint  Patient presents with   Follow-up    C/o prod cough with clear sputum and sob with exertion.     HPI This is a 80 year old former smoker who presents today for follow-up his initial visit here was on 08 August 2020.  He has issues with Lewy body dementia associated with parkinsonism.  He is currently enrolled in palliative/hospice.  He has had complaint of shortness of breath related to obesity, anemia, deconditioning and grade 2 diastolic dysfunction.  His activity is very limited due to his parkinsonism.  He does not note any improvement with inhalers so therefore he does not use these.  At his prior visit we ordered overnight oximetry on CPAP, overnight oximetry did not show any need for supplemental oxygen.  He continues to deteriorate from his neurologic issues.  Swallow evaluation did not show any significant dysphagia though family does note choking episodes still.  Most of his "dyspnea" is mostly related to effort particularly when moving from chair to bed etc.  Review of Systems A 10 point review of systems was performed and it is as noted above otherwise negative.  Patient Active Problem List   Diagnosis Date Noted   Parkinsonism ([place]) 08/23/2020   Heart failure with preserved ejection fraction, borderline, class II ([place]) 08/23/2020   Slurred speech 06/04/2020   CKD (chronic kidney disease) stage 3, GFR 30-59 ml/min ([place]) 06/04/2020   Essential hypertension 06/04/2020   Hyperlipidemia 06/04/2020   Lewy body dementia without behavioral disturbance ([place]) 05/21/2020   Hyperlipidemia associated with type 2 diabetes mellitus ([place]) 05/21/2020   Status post total right knee replacement 01/22/2020   Anemia of chronic kidney failure, stage 3 (moderate) ([place]) 01/16/2020   Total knee replacement status 11/02/2019   History of total knee arthroplasty 11/02/2019   PAD  (peripheral artery disease) ([place]) 10/13/2019   Benign prostatic hyperplasia    Seizures ([place])    Anemia due to stage 4 chronic kidney disease ([place]) 06/24/2019   Generalized weakness 06/24/2019   Anemia in chronic kidney disease ([facility]) 06/24/2019   Asthenia 06/24/2019   Primary osteoarthritis of right knee 04/03/2019   Use of cane as ambulatory aid 04/27/2018   Dependence on other enabling machines and devices 04/27/2018   Type 2 diabetes mellitus with stage 4 chronic kidney disease, without long-term current use of insulin ([place]) 06/11/2017   Lymphedema 05/04/2016   Chronic venous insufficiency 05/04/2016   Toe sprain 01/24/2016   Morbid obesity with body mass index of 40.0-44.9 in adult [facility]) 05/24/2014   Body mass index (BMI)40.0-44.9, adult 05/24/2014   OSA on CPAP 01/10/2014   CKD (chronic kidney disease), stage IV ([place]) 11/22/2013   Allergic rhinitis 11/06/2013   Benign hypertension with CKD (chronic kidney disease) stage IV ([place]) 11/06/2013   Epilepsy ([place]) 11/06/2013   Gout 11/06/2013   History of DVT (deep vein thrombosis) 11/06/2013   Chronic deep vein thrombosis (DVT) of proximal vein of lower extremity ([place]) 11/06/2013   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 ([place]) 11/06/2013   Personal history of other venous thrombosis and embolism 11/06/2013   Social History   Tobacco Use   Smoking status: Former    Packs/day: 1.00    Years: 10.00    Pack years: 10.00    Types: Cigarettes    Quit date: 06/23/1968  Years since quitting: 52.8   Smokeless tobacco: Never  Substance Use Topics   Alcohol use: No   Allergies  Allergen Reactions   Shellfish Allergy Anaphylaxis and Other (See Comments)    gout   Iodinated Diagnostic Agents Other (See Comments)    Chronic kidney disease 3b   Nsaids Other (See Comments)    Chronic kidney disease 3b   Rivastigmine Other (See Comments)    Side effect and not a true allergy    Medications were reviewed and are as noted.  Immunization History  Administered Date(s) Administered   Influenza Inj Mdck Quad Pf 05/03/2019   Influenza Split 05/24/2014   Influenza,inj,Quad PF,6+ Mos 03/13/2020   Influenza-Unspecified 03/31/2017, 05/03/2019   PFIZER(Purple Top)SARS-COV-2 Vaccination 08/08/2019, 08/30/2019, 04/09/2020   PPD Test 08/20/2020   Pneumococcal Conjugate-13 05/23/2016   Pneumococcal Polysaccharide-23 12/22/2010   Zoster, Live 01/31/2015      Objective:   Physical Exam BP (!) 144/90 (BP Location: Right Arm, Cuff Size: Large)   Pulse 63   Temp (!) 97.5 F (36.4 C) (Temporal)   Ht 6' (1.829 m)   Wt (!) 301 lb (136.5 kg)   SpO2 97%   BMI 40.82 kg/m  GENERAL: Morbidly obese gentleman, no acute distress, presents in transport chair.  Occasional throat clearing. HEAD: Normocephalic, atraumatic.  EYES: Pupils equal, round, reactive to light.  No scleral icterus.  MOUTH: Nose/mouth/throat not examined due to masking requirements for COVID 19. NECK: Supple. No thyromegaly. Trachea midline. No JVD.  No adenopathy. PULMONARY: Good air entry bilaterally.  Coarse, no rhonchi otherwise no adventitious sounds. CARDIOVASCULAR: S1 and S2. Regular rate and rhythm.  ABDOMEN: Obese, otherwise benign. MUSCULOSKELETAL: No joint deformity, no clubbing, no edema.  NEUROLOGIC: No overt focal deficit, cannot assess gait, in transport chair. SKIN: Intact,warm,dry. PSYCH: Flat affect, no psychomotor retardation noted.  Can only answer very simple questions.     Assessment & Plan:     ICD-10-CM   1. Shortness of breath  R06.02    Multifactorial as noted above Mostly related to obesity, deconditioning and underlying parkinsonism    2. Lewy body dementia without behavioral disturbance (HCC)  G31.83    F02.80    Continues to worsen Family to enroll patient in palliative care/hospice    3. Parkinson's disease ([place])  G20    This issue adds complexity to his  management Continues to worsen Will be enrolled in palliative care/hospice per family    4. Grade II diastolic dysfunction  O75.64    Adds to his dyspnea issues    5. Dysphagia, unspecified type  R13.10    Swallow eval was negative He does have issues however at home This will progress given his parkinsonism    6. CKD (chronic kidney disease), stage IV ([place])  N18.4    This issue adds complexity to his management    7. OSA on CPAP  G47.33    Z99.89    [name] family states he is compliant Overnight oximetry on CPAP did not show need for O2 Managed by neurology     The patient had a short course of use of tobacco in the past.  I cannot ascribe his symptoms to COPD.  His dementia limits the possibility of getting PFTs due to inability to follow instruction.  Family has not noticed any improvement on perception of dyspnea with inhalers.  It appears that what they perceived as dyspnea is more effort related and likely fatigue.  In any event the patient is going  to be enrolled in palliative care/hospice.  We will see the patient in follow-up on an as-needed basis.  Unfortunately very little that we can add to this gentlemen's care at this point.  Renold Don, MD [facility]    *This note was dictated using voice recognition software/Dragon.  Despite best efforts to proofread, errors can occur which can change the meaning.  Any change was purely unintentional.

## 2020-09-18 NOTE — Patient Instructions (Signed)
Continue your present plan of care.  We will see you back as needed.  The community hospice nurse can call us anytime if there are any questions or concerns.

## 2020-10-01 DIAGNOSIS — N1831 Chronic kidney disease, stage 3a: Secondary | ICD-10-CM | POA: Diagnosis not present

## 2020-10-01 DIAGNOSIS — E875 Hyperkalemia: Secondary | ICD-10-CM | POA: Diagnosis not present

## 2020-10-01 DIAGNOSIS — R600 Localized edema: Secondary | ICD-10-CM | POA: Diagnosis not present

## 2020-10-01 DIAGNOSIS — I10 Essential (primary) hypertension: Secondary | ICD-10-CM | POA: Diagnosis not present

## 2020-10-01 DIAGNOSIS — I890 Lymphedema, not elsewhere classified: Secondary | ICD-10-CM | POA: Diagnosis not present

## 2020-10-08 DIAGNOSIS — G4733 Obstructive sleep apnea (adult) (pediatric): Secondary | ICD-10-CM | POA: Diagnosis not present

## 2020-10-11 ENCOUNTER — Ambulatory Visit: Payer: Self-pay | Admitting: General Practice

## 2020-10-11 ENCOUNTER — Telehealth: Payer: Self-pay | Admitting: General Practice

## 2020-10-11 DIAGNOSIS — N184 Chronic kidney disease, stage 4 (severe): Secondary | ICD-10-CM

## 2020-10-11 DIAGNOSIS — F0280 Dementia in other diseases classified elsewhere without behavioral disturbance: Secondary | ICD-10-CM

## 2020-10-11 DIAGNOSIS — I129 Hypertensive chronic kidney disease with stage 1 through stage 4 chronic kidney disease, or unspecified chronic kidney disease: Secondary | ICD-10-CM

## 2020-10-11 DIAGNOSIS — E1122 Type 2 diabetes mellitus with diabetic chronic kidney disease: Secondary | ICD-10-CM

## 2020-10-11 NOTE — Chronic Care Management (AMB) (Signed)
Care Management    RN Visit Note  10/11/2020 Name: [facility]. MRN: [id number] DOB: 08-18-1940  Subjective: Justin Shannon. is a 80 y.o. year old male who is a primary care patient of [name], DO. The care management team was consulted for assistance with disease management and care coordination needs.    Engaged with patient by telephone for follow up visit in response to provider referral for case management and/or care coordination services. Spoke to the patients daughter [name] who is the patients primary care giver.   Consent to Services:   Mr. Sayegh was given information about Care Management services today including:  1. Care Management services includes personalized support from designated clinical staff supervised by his physician, including individualized plan of care and coordination with other care providers 2. 24/7 contact phone numbers for assistance for urgent and routine care needs. 3. The patient may stop case management services at any time by phone call to the office staff.  Patient agreed to services and consent obtained.   Assessment: Review of patient past medical history, allergies, medications, health status, including review of consultants reports, laboratory and other test data, was performed as part of comprehensive evaluation and provision of chronic care management services.   SDOH (Social Determinants of Health) assessments and interventions performed:    Care Plan  Allergies  Allergen Reactions  . Shellfish Allergy Anaphylaxis and Other (See Comments)    gout  . Iodinated Diagnostic Agents Other (See Comments)    Chronic kidney disease 3b  . Nsaids Other (See Comments)    Chronic kidney disease 3b  . Rivastigmine Other (See Comments)    Side effect and not a true allergy    Outpatient Encounter Medications as of 10/11/2020  Medication Sig  . albuterol (VENTOLIN HFA) 108 (90 Base) MCG/ACT inhaler Inhale 2 puffs into  the lungs every 4 (four) hours as needed for wheezing.  Marland Kitchen allopurinol (ZYLOPRIM) 100 MG tablet Take 100 mg by mouth daily.  Marland Kitchen atorvastatin (LIPITOR) 20 MG tablet Take 1 tablet (20 mg total) by mouth at bedtime.  . docusate sodium (COLACE) 100 MG capsule Take 100 mg by mouth daily as needed for mild constipation or moderate constipation.   . fexofenadine (ALLEGRA) 180 MG tablet Take 180 mg by mouth daily as needed for allergies.   . fluticasone (FLONASE) 50 MCG/ACT nasal spray Place 2 sprays into both nostrils daily as needed for allergies.   Marland Kitchen levETIRAcetam (KEPPRA) 500 MG tablet Take 500 mg by mouth 2 (two) times daily.   . Magnesium 250 MG TABS Take 250 mg by mouth daily.  . metoprolol succinate (TOPROL-XL) 50 MG 24 hr tablet Take 50 mg by mouth daily.   . Multiple Vitamin (ONE-A-DAY MENS PO) Take 1 tablet by mouth daily.   . rivaroxaban (XARELTO) 10 MG TABS tablet TAKE ONE TABLET BY MOUTH EVERY DAY WITH BREAKFAST  . torsemide (DEMADEX) 20 MG tablet Take 1 tab by mouth every Mon/Wed/Fri/Sat (4 days a week)  . XARELTO 10 MG TABS tablet Take 1 tablet (10 mg total) by mouth daily before breakfast.   No facility-administered encounter medications on file as of 10/11/2020.    Patient Active Problem List   Diagnosis Date Noted  . Parkinsonism ([place]) 08/23/2020  . Heart failure with preserved ejection fraction, borderline, class II ([place]) 08/23/2020  . Slurred speech 06/04/2020  . CKD (chronic kidney disease) stage 3, GFR 30-59 ml/min (HCC) 06/04/2020  . Essential hypertension 06/04/2020  .  Hyperlipidemia 06/04/2020  . Lewy body dementia without behavioral disturbance ([place]) 05/21/2020  . Hyperlipidemia associated with type 2 diabetes mellitus ([place]) 05/21/2020  . Status post total right knee replacement 01/22/2020  . Anemia of chronic kidney failure, stage 3 (moderate) ([place]) 01/16/2020  . Total knee replacement status 11/02/2019  . History of total knee arthroplasty 11/02/2019  . PAD  (peripheral artery disease) ([place]) 10/13/2019  . Benign prostatic hyperplasia   . Seizures ([place])   . Anemia due to stage 4 chronic kidney disease ([place]) 06/24/2019  . Generalized weakness 06/24/2019  . Anemia in chronic kidney disease ([place]) 06/24/2019  . Asthenia 06/24/2019  . Primary osteoarthritis of right knee 04/03/2019  . Use of cane as ambulatory aid 04/27/2018  . Dependence on other enabling machines and devices 04/27/2018  . Type 2 diabetes mellitus with stage 4 chronic kidney disease, without long-term current use of insulin ([place]) 06/11/2017  . Lymphedema 05/04/2016  . Chronic venous insufficiency 05/04/2016  . Toe sprain 01/24/2016  . Morbid obesity with body mass index of 40.0-44.9 in adult [facility]) 05/24/2014  . Body mass index (BMI)40.0-44.9, adult 05/24/2014  . OSA on CPAP 01/10/2014  . CKD (chronic kidney disease), stage IV ([place]) 11/22/2013  . Allergic rhinitis 11/06/2013  . Benign hypertension with CKD (chronic kidney disease) stage IV ([place]) 11/06/2013  . Epilepsy ([place]) 11/06/2013  . Gout 11/06/2013  . History of DVT (deep vein thrombosis) 11/06/2013  . Chronic deep vein thrombosis (DVT) of proximal vein of lower extremity ([place]) 11/06/2013  . 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 Personal history of other venous thrombosis and embolism 11/06/2013    Conditions to be addressed/monitored: HTN, DMII and Dementia  Care Plan : General Social Work (Adult)  Updates made by Vanita Ingles since 10/11/2020 12:00 AM    Problem: Caregiver Stress     Long-Range Goal: Secure LTC placement Completed 10/11/2020  Start Date: 08/21/2020  Priority: Medium  Note:    Timeframe:  Long-Range Goal Priority:  Medium Start Date: 08/21/20                   Expected End Date: 11/21/20                  Follow Up Date-10/17/20   - avoid busy places that are confusing such as the shopping malls - check hot water  heater temperature, turn down if too hot - get rid of poisonous plants - keep medicine where it is safe or locked - keep power tools, knives and cleaning products in a safe place - put deadbolts either high or low on outside doors - put car keys out of sight - remove knobs from stove and oven - remove vitamins, medicine, sugar substitutes and seasonings from the kitchen table and counters - supervise the use of tobacco and alcohol - use a GPS device in the car - use an audio or video monitor - use appliances that have an auto shut-off feature - use motion-sensor alarms outdoors, in the bedroom and the kitchen - use seat cushions, floor mats and bed pads that are wired to alarm when getting up    Why is this important?    Safety is important when you or your loved one has dementia.   Eyesight, hearing and changes in feelings of hot and cold or depth-perception may occur.   Wandering or getting lost may happen.   You/your loved one  may have to stop driving.   Taking steps to improve safety can prevent injuries.   It will also help you/your loved one feel more relaxed.   It will help you/your loved one be independent for a longer time.     Assessment, press & current barriers:  Patient unable to consistently perform activities of daily living and needs additional assistance and support in order to meet this unmet need . Limited social support, ADL IADL limitations, Social Isolation, Limited access to caregiver, Cognitive Deficits, Memory Deficits, Inability to perform ADL's independently, and Inability to perform IADL's independently . Unable to self administer medications as prescribed . Lacks social connections  Clinical Goals:   Over the next 120 days, patient/caregiver will work with SW to address concerns related to care coordination needs, lack of a stable support network and lack of Brewing technologist. LCSW will assist patient in gaining additional  support in order to maintain health and mental health appropriately   Over the next 120 days, patient will demonstrate improved adherence to self care as evidenced by implementing healthy self-care into his daily routine such as: attending all medical appointments, deep breathing exercises, taking time for self-reflection, taking medications as prescribed, drinking water and daily exercise to improve mobility and mood.   Over the next 120 days, patient will demonstrate improved health management independence as evidenced by implementing healthy self-care skills and positive support/resource implementation into their daily routine to help cope with stressors and improve overall health and well-being   Over the next 120 days, patient or caregiver will verbalize basic understanding of depression/stress process and self health management plan as evidenced by his participation in development of long term plan of care and institution of self health management strategies  Interventions : . Assessed needs, level of care concerns, basic eligibility and provided education on the entire LTC placement process.  . Daughter reports that patient is actively involved with [facility]. A nurse comes out twice per week and the aide comes three times per week.  . Patient has Lewy Body Dementia and is in need of a higher level of care/support. Family prefers LTC placement at this time. Reviewed community support options ( CAP, private pay, PACE program) . Patient was informed that current CCM LCSW will be leaving position next month and her next CCM Social Work follow up visit will be with another LCSW. Patient was appreciative of support provided and receptive to news . Will collaborate with primary care provider regarding LTC placement. PCP is aware that several facilities have declined patient for placement due to his chronic health care needs.  . Daughter reports that they received a foyer lift  because patient is unable to use his left side of the body and is unable to ambulate out of the bed. . Daughter reports that family applied for Special Assistance Medicaid but several facilities with memory care that they were looking into were only able to accept patients under private pay for 3 years until they would accept him for Medicaid. UPDATE- Daughter is going to apply for long term care Medicaid (for skilled nursing) today on 07/24/20. Daughter was advised to contact DSS to advise them to disregard the Special Assistance Medicaid application but to consider the LTC application. Daughter will contact DSS today. Daughter has been talking with the admissions coordinator at [facility] regarding their long term care program. UPDATE 08/21/20- Daughter reports that she successfully received a reference number for Medicaid and will write it  down to provide to [facility]. [facility] has a wait list for Medicaid beds but family still wishes to attempt placement. Patient will complete office visit with PCP tomorrow and will complete FL2 and PPD. Family will turn in financial application to [facility] and then they will review his case for enrollment. Family does NOT want patient going to either [facility] or [facility]. [facility] will complete [facility]- waiting on FL2, PPD. Turn in financial application. Review and do their assessment.. Is a wait list. skilled. [facility] not want [facility]. [facility] would not accept him. Per daughter, [facility] didn't have enough staff to accept patient either. [facility] no longer has memory care. Daughter even called several facilities in [facility] but they would not accept patient due to his chronic disease management needs. CCM LCSW advised daughter to contact [facility] as [facility] SNF. CCM LCSW sent daughter a list of Skilled Nursing Facilities within [place], [place]. However, [place] social worker  has already been unable to secure SNF placement for patient as several facilities declined him as well in [facility]. UPDATE 09/18/20- Patient was declined for [place] Medicaid because of his assets. Daughter reports that she never heard back from several assisted living facilities after they received patient's FL2 form. However, family report that patient is doing a lot better physically and mentally. [name] hallucinations have decreased. Daughter reports that patient has more energy and is more alert. Patient is even helping the family out when he can.  . HH ended because patient was not making progress with therapy.  . Email sent to daughter with a list of long term care facilities within her area.  . Patient interviewed and appropriate assessments performed . Referred patient in the past to community resource care guide team for assistance with in home support through [facility] . Provided mental health counseling with regard to caregiver strain . Discussed plans with patient for ongoing care management follow up and provided patient with direct contact information for care management team . Assisted patient/caregiver with obtaining information about health plan benefits . Provided education and assistance to client regarding Advanced Directives. . Provided education to patient/caregiver regarding level of care options. . Provided education to patient/caregiver about Hospice and/or Palliative Care services . 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 Marland Kitchen Patient completed pulmonary appointment today on 09/18/20 and physician said that his lungs looked good and clear.  . Other interventions provided: Solution-Focused Strategies   Follow Up Plan:  . SW will follow up with patient by phone over the next month . Closing care plan on behalf of the LCSW. Patient now is admitted to Hospice care services     Task: Recognize and Manage Caregiver Stress Completed  10/11/2020  Note:   Care Management Activities:    - active listening utilized - caregiver stress acknowledged - complementary therapy use encouraged - consideration of in-home help encouraged - decision-making supported - engagement with primary care provider encouraged - healthy lifestyle promoted - mental health treatment facilitated - participation in support group encouraged - positive reinforcement provided - self-reflection promoted - social and community activities encouraged - spiritual activity use promoted - support group information provided - verbalization of feelings encouraged    Notes: Closing on behalf of LCSW. The patient is now under hospice services    Care Plan : RNCM: Lewey Body Dementia (Adult)  Updates  made by Vanita Ingles since 10/11/2020 12:00 AM    Problem: RNCM: Management of Lewey Body Dementia   Priority: High    Long-Range Goal: RNCM: Behavior Symptoms Management Completed 10/11/2020  Expected End Date: 10/12/2020  Priority: High  Note:   CARE PLAN ENTRY (see longtitudinal plan of care for additional care plan information)  Current Barriers: Closing this goal. The patient is now under Hospice care services and CCM services has ended. Confirmed with pcp.  . Chronic Disease Management support, education, and care coordination needs related to Dementia  Clinical Goal(s) related to Dementia: Lewey Body Dementia Over the next 120 days, patient will:  . Work with the care management team to address educational, disease management, and care coordination needs  . Begin or continue self health monitoring activities as directed today  support and education for patient and caregiver with diagnosis of Lewey body dementia and decline in health . Call provider office for new or worsened signs and symptoms New or worsened symptom related to dementia and changes in conditions . Call care management team with questions or concerns . Verbalize basic  understanding of patient centered plan of care established today  Interventions related to Dementia:  . Evaluation of current treatment plans and patient's adherence to plan as established by provider.  06-14-2020: The patient saw pcp on 06-11-2020. Referral for CCM team support. The patient has been living with the daughter and her family since October. His wife is actively taking radiation treatments at this time and due to decline in the patients health the daughter has moved the patient in with her and her family. The patient saw neurologist today and further testing will be done by an in-home study and a sub referral for additional diagnosis and treatment options. 07-19-2020: The patient has had a further decline in dementia and is aggressive at times. He pulls his diaper off and is wetting the bed. His daughter has ask the other provider about getting a hoyer lift as she has hurt herself when trying to lift the patient. The patient has become totally dependent on his daughter for feeding, bathroom needs and all care. The daughter is open for recommendations as home health stopped coming as of yesterday due to the patient not progressing in his care. 08-16-2020: The patient continues to decline. He is falling more, he is not remembering his family, having issues with swallowing. The daughter is experiencing care giver burnout. She is trying to keep him in her home but it is getting harder each day. She is overwhelmed and does not know what to do to help the patient. Empathetic listening and support. Call to LCSW to assist with collaboration and help for the patient and patients caregiver. LCSW will email additional resources and also move appointment up to call the patients daughter sooner.  . Assessed patient understanding of disease states.  The daughter verbalized the patient does not fully understand what is going on but goes to appointments, eats well, takes medications, and is working with OT/PT in the  home. An aide come 2 times a week and bathes the patient.  07-19-2020: The patient has had further decline in his condition. She called memory care places but they have recommended skilled nursing. Will collaborate with LCSW and pcp. 08-16-2020: The patient continues to decline. The daughter has talked to several agencies and there is a waiting list for 6 months or greater. Increased fall risk as the patient continues to fall out of the bed and out  of his wheelchair. Advised the daughter if the patient has a fall again to have him evaluated and ask for physical therapy referral.  . Assessed patient's education and care coordination needs.  The daughter spoke to the LCSW earlier today and care guide referral made. RNCM reviewed the role of the CCM team and support of the CCM team in the help of the patient and caregiver meeting the healthy and wellness needs of the patient. 1--27-2022: Provided information to the patient daughter about [facility] helping with incontinence supplies. Also sent images of washable pads to the daughter to consider to help with patient and having incontinence episodes. The daughter is open to recommendations for help as she only has her husband helping her and he is a truck driver and not always home. 08-16-2020: The daughter is seeking any help that she can get to get the help the patient needs.  She is concerned over her own health and safety. She is appreciative of the CCM team and support. She is willing to do what she can to help the patient get the care he needs.  . Provided disease specific education to patient.  Education given on resources available and how to contact the RNCM. Education on follow up calls and reaching out to the [facility] for new concerns before next outreach. 07-19-2020: Additional information provided to discuss with [facility] at upcoming visit the hospice benefit and instructed the patients daughter to write down questions to ask about recommendations. Will discuss  with the pcp about a new FL2.  The patients daughter is aware that the pcp will be out of the office until 08-01-2020. 08-16-2020: The daughter is taking the patient to several upcoming appointments. The patient has a swallowing evaluation next week, sees pcp for paperwork and TB testing. Additional information provided today via email for long term placement facilities.  Nash Dimmer with appropriate clinical care team members regarding patient needs.  The patient is currently working with the LCSW. Will refer the pharmacist for help with medication cost constraints. 07-19-2020: Co-collaboration with the LCSW on resources for LTC facility information and help. The LCSW plans to reach out to the patients daughter next week also. 08-16-2020: Daughter tearful on the phone with the [facility]. Ask for suggestions to respite care.  Co-collaboration by phone with the LCSW.  . - action plan for worsening symptoms mutually developed . - Financial risk analyst provided . - symptom review completed . -safety reviewed- the family uses baby monitors and knows when the patient attempts to get out of bed . -medication safety reviewed- the daughter given the patient his medications and manages his medications.   Patient Self Care Activities related to Dementia:  . Patient is unable to independently self-manage chronic health conditions  09-13-2020 at 1:45 pm  10-11-2020: The patients daughter is working with Hospice now. The patient is actually doing pretty good at this time. The patient has multiple chronic conditions but is stable at this time. Review of working with hospice and CCM services ending at this time. The daughter has the [facility] number for questions or concerns.    Task: RNCM: Develop Strategies to Manage Behavior Completed 10/11/2020  Note:   Care Management Activities:    - action plan for worsening symptoms mutually developed - community resource information provided - symptom review completed     Notes: The patient is going to have further testing to evaluate dementia- saw neurologist today 10-11-2020: Is now enrolled in Hospice, CCM services completed.  Care Plan : RNCM: Management of Diabetes Type 2 (Adult)  Updates made by Vanita Ingles since 10/11/2020 12:00 AM    Problem: RNCM: Glycemic Management (Diabetes, Type 2)   Priority: Medium    Goal: RNCM: Management of DM Completed 10/11/2020  Priority: Medium  Note:   Objective:  Lab Results  Component Value Date   HGBA1C 5.9 (H) 06/05/2020 .   Lab Results  Component Value Date   CREATININE 1.94 (H) 06/06/2020   CREATININE 1.86 (H) 06/06/2020   CREATININE 1.79 (H) 06/04/2020 .   Marland Kitchen No results found for: EGFR Current Barriers:  Marland Kitchen Knowledge Deficits related to basic Diabetes pathophysiology and self care/management . Knowledge Deficits related to medications used for management of diabetes . Limited Social Support . Unable to independently manage DM as evidence of advancing dementia and inability to manage care without the help of his daughter . Unable to self administer medications as prescribed . Unable to perform ADLs independently . Unable to perform IADLs independently Case Manager Clinical Goal(s):  Marland Kitchen Over the next 120 days, patient will demonstrate improved adherence to prescribed treatment plan for diabetes self care/management as evidenced by:  . daily monitoring and recording of CBG  . adherence to ADA/ carb modified diet . adherence to prescribed medication regimen Interventions:  . Provided education to patient about basic DM disease process . Reviewed medications with patient and discussed importance of medication adherence . Discussed plans with patient for ongoing care management follow up and provided patient with direct contact information for care management team . Provided patient with written educational materials related to hypo and hyperglycemia and importance of correct treatment . Advised  patient, providing education and rationale, to check cbg daily and record, calling pcp for findings outside established parameters.   . Review of patient status, including review of consultants reports, relevant laboratory and other test results, and medications completed. . - A1C testing facilitated . - barriers to adherence to treatment plan identified . - blood glucose readings reviewed . - resources required to improve adherence to care identified . - self-awareness of signs/symptoms of hypo or hyperglycemia encouraged Patient Goals/Self-Care Activities . Over the next 120 days, patient will:  - UNABLE to independently manage DM Self administers oral medications as prescribed Attends all scheduled provider appointments Checks blood sugars as prescribed and utilize hyper and hypoglycemia protocol as needed Adheres to prescribed ADA/carb modified Follow Up Plan: Telephone follow up appointment with care management team member scheduled for: 09-13-2020 at 1:45 pm   Task: RNCM: Alleviate Barriers to Glycemic Management Completed 10/11/2020  Note:   Care Management Activities:    - A1C testing facilitated - barriers to adherence to treatment plan identified - blood glucose readings reviewed - resources required to improve adherence to care identified - self-awareness of signs/symptoms of hypo or hyperglycemia encouraged  Enrolled in Hospice services, CCM services closed     Care Plan : RNCM:Hypertension (Adult)  Updates made by Vanita Ingles since 10/11/2020 12:00 AM    Problem: Hypertension (Hypertension)     Goal: RNCM: Management of HTN Completed 10/11/2020  Expected End Date: 10/12/2020  Priority: Medium  Note:   Objective: Enrolled in Hospice services. CCM service completed.  . Last practice recorded BP readings:  . BP Readings from Last 3 Encounters: .  08/08/20 . (!) 122/58 .  07/22/20 . (!) 166/99 .  06/28/20 . (!) 153/65 .    Marland Kitchen Most recent eGFR/CrCl: No results found  for: EGFR  No components found for: CRCL Current Barriers:  Marland Kitchen Knowledge Deficits related to basic understanding of hypertension pathophysiology and self care management . Knowledge Deficits related to understanding of medications prescribed for management of hypertension . 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 Unable to independently HTN as evidence of decline in health and having to move in with daughter to assist with care.  Leodis [place] social connections . Unable to perform ADLs independently . Unable to perform IADLs independently Case Manager Clinical Goal(s):  Marland Kitchen Over the next 120 days, patient will verbalize understanding of plan for hypertension management . Over the next 120 days, patient will demonstrate improved adherence to prescribed treatment plan for hypertension as evidenced by taking all medications as prescribed, monitoring and recording blood pressure as directed, adhering to low sodium/DASH diet . Over the next 120 days, patient will demonstrate improved health management independence as evidenced by checking blood pressure as directed and notifying PCP if SBP>160 or DBP > 90, taking all medications as prescribe, and adhering to a low sodium diet as discussed. Interventions:  Marland Kitchen UNABLE to independently:manage health and wellness due to decline in memory and recent moving in with his daughter to assist with his care . Evaluation of current treatment plan related to hypertension self management and patient's adherence to plan as established by provider. . Provided education to patient re: stroke prevention, s/s of heart attack and stroke, DASH diet, complications of uncontrolled blood pressure . Reviewed medications with patient and discussed importance of compliance. Pharmacist referral for questions and financial help for Xarelto  . Discussed plans with patient for ongoing care management follow up and provided patient with direct contact  information for care management team . Review of dietary habits. The patient is having to be feed now as he can not see to feed himself. The daughter is having a hard time with caring for the patient.  . Advised patient, providing education and rationale, to monitor blood pressure daily and record, calling PCP for findings outside established parameters.  . - blood pressure trends reviewed . - depression screen reviewed . - home or ambulatory blood pressure monitoring encouraged Patient Goals/Self-Care Activities . Over the next 120 days, patient will:  - Self administers medications as prescribed Checks BP and records as discussed Follows a low sodium diet/DASH diet Follow Up Plan: Telephone follow up appointment with care management team member scheduled for: 09-13-2020 at 1:45 pm   Task: RNCM: Identify and Monitor Blood Pressure Elevation Completed 10/11/2020  Note:   Care Management Activities:    - blood pressure trends reviewed - depression screen reviewed - home or ambulatory blood pressure monitoring encouraged        Plan: No further follow up required: the patient is now enrolled in Hospice services. CCM services has ended. If the patient is discharged from [facility] may re-refer for CCM services.  Noreene Larsson RN, MSN, [place] Mobile: [phone]

## 2020-10-11 NOTE — Patient Instructions (Signed)
Visit Information  Goals Addressed            This Visit's Progress   . COMPLETED: Keeping Myself/Loved One Safe-Dementia        Timeframe:  Long-Range Goal Priority:  Medium Start Date: 08/21/20                   Expected End Date: 11/21/20                  Follow Up Date-10/17/20   - avoid busy places that are confusing such as the shopping malls - check hot water heater temperature, turn down if too hot - get rid of poisonous plants - keep medicine where it is safe or locked - keep power tools, knives and cleaning products in a safe place - put deadbolts either high or low on outside doors - put car keys out of sight - remove knobs from stove and oven - remove vitamins, medicine, sugar substitutes and seasonings from the kitchen table and counters - supervise the use of tobacco and alcohol - use a GPS device in the car - use an audio or video monitor - use appliances that have an auto shut-off feature - use motion-sensor alarms outdoors, in the bedroom and the kitchen - use seat cushions, floor mats and bed pads that are wired to alarm when getting up    Why is this important?    Safety is important when you or your loved one has dementia.   Eyesight, hearing and changes in feelings of hot and cold or depth-perception may occur.   Wandering or getting lost may happen.   You/your loved one may have to stop driving.   Taking steps to improve safety can prevent injuries.   It will also help you/your loved one feel more relaxed.   It will help you/your loved one be independent for a longer time.     Assessment, press & current barriers:  Patient unable to consistently perform activities of daily living and needs additional assistance and support in order to meet this unmet need . Limited social support, ADL IADL limitations, Social Isolation, Limited access to caregiver, Cognitive Deficits, Memory Deficits, Inability to perform ADL's independently, and Inability to perform  IADL's independently . Unable to self administer medications as prescribed . Lacks social connections  Clinical Goals:   Over the next 120 days, patient/caregiver will work with SW to address concerns related to care coordination needs, lack of a stable support network and lack of Brewing technologist. LCSW will assist patient in gaining additional support in order to maintain health and mental health appropriately   Over the next 120 days, patient will demonstrate improved adherence to self care as evidenced by implementing healthy self-care into his daily routine such as: attending all medical appointments, deep breathing exercises, taking time for self-reflection, taking medications as prescribed, drinking water and daily exercise to improve mobility and mood.   Over the next 120 days, patient will demonstrate improved health management independence as evidenced by implementing healthy self-care skills and positive support/resource implementation into their daily routine to help cope with stressors and improve overall health and well-being   Over the next 120 days, patient or caregiver will verbalize basic understanding of depression/stress process and self health management plan as evidenced by his participation in development of long term plan of care and institution of self health management strategies  Interventions : . Assessed needs, level of care concerns, basic eligibility and provided  education on the entire LTC placement process.  . Daughter reports that patient is actively involved with [facility]. A nurse comes out twice per week and the aide comes three times per week.  . Patient has Lewy Body Dementia and is in need of a higher level of care/support. Family prefers LTC placement at this time. Reviewed community support options ( CAP, private pay, PACE program) . Patient was informed that current CCM LCSW will be leaving position next month  and her next CCM Social Work follow up visit will be with another LCSW. Patient was appreciative of support provided and receptive to news . Will collaborate with primary care provider regarding LTC placement. PCP is aware that several facilities have declined patient for placement due to his chronic health care needs.  . Daughter reports that they received a foyer lift because patient is unable to use his left side of the body and is unable to ambulate out of the bed. . Daughter reports that family applied for Special Assistance Medicaid but several facilities with memory care that they were looking into were only able to accept patients under private pay for 3 years until they would accept him for Medicaid. UPDATE- Daughter is going to apply for long term care Medicaid (for skilled nursing) today on 07/24/20. Daughter was advised to contact DSS to advise them to disregard the Special Assistance Medicaid application but to consider the LTC application. Daughter will contact DSS today. Daughter has been talking with the admissions coordinator at [facility] regarding their long term care program. UPDATE 08/21/20- Daughter reports that she successfully received a reference number for Medicaid and will write it down to provide to [facility]. [facility] has a wait list for Medicaid beds but family still wishes to attempt placement. Patient will complete office visit with PCP tomorrow and will complete FL2 and PPD. Family will turn in financial application to [facility] and then they will review his case for enrollment. Family does NOT want patient going to either [facility] or [facility]. [facility]- waiting on FL2, PPD. Turn in financial application. Review and do their assessment.. Is a wait list. skilled. [facility] not want [facility]. [facility] would not accept him. Per daughter, [facility] didn't have enough staff to accept patient  either. [facility] no longer has memory care. Daughter even called several facilities in [facility] but they would not accept patient due to his chronic disease management needs. CCM LCSW advised daughter to contact [facility] as they have SNF. CCM LCSW sent daughter a list of Skilled Nursing Facilities within [place], [place]. However, [place] social worker has already been unable to secure SNF placement for patient as several facilities declined him as well in [facility]. UPDATE 09/18/20- Patient was declined for [place] Medicaid because of his assets. Daughter reports that she never heard back from several assisted living facilities after they received patient's FL2 form. However, family report that patient is doing a lot better physically and mentally. [name] hallucinations have decreased. Daughter reports that patient has more energy and is more alert. Patient is even helping the family out when he can.  . HH ended because patient was not making progress with therapy.  . Email sent to daughter with a list of long term care facilities within her area.  . Patient interviewed and appropriate assessments performed . Referred patient in the  past to community resource care guide team for assistance with in home support through [facility] . Provided mental health counseling with regard to caregiver strain . Discussed plans with patient for ongoing care management follow up and provided patient with direct contact information for care management team . Assisted patient/caregiver with obtaining information about health plan benefits . Provided education and assistance to client regarding Advanced Directives. . Provided education to patient/caregiver regarding level of care options. . Provided education to patient/caregiver about Hospice and/or Palliative Care services . 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 Marland Kitchen Patient completed pulmonary appointment today  on 09/18/20 and physician said that his lungs looked good and clear.  . Other interventions provided: Solution-Focused Strategies   Follow Up Plan:  . SW will follow up with patient by phone over the next month. 10-11-2020: Closing on behalf of the LCSW. The patient is now under hospice services and CCM services will end now.       Patient Care Plan: General Social Work (Adult)    Problem Identified: Caregiver Stress     Long-Range Goal: Secure LTC placement Completed 10/11/2020  Start Date: 08/21/2020  Priority: Medium  Note:    Timeframe:  Long-Range Goal Priority:  Medium Start Date: 08/21/20                   Expected End Date: 11/21/20                  Follow Up Date-10/17/20   - avoid busy places that are confusing such as the shopping malls - check hot water heater temperature, turn down if too hot - get rid of poisonous plants - keep medicine where it is safe or locked - keep power tools, knives and cleaning products in a safe place - put deadbolts either high or low on outside doors - put car keys out of sight - remove knobs from stove and oven - remove vitamins, medicine, sugar substitutes and seasonings from the kitchen table and counters - supervise the use of tobacco and alcohol - use a GPS device in the car - use an audio or video monitor - use appliances that have an auto shut-off feature - use motion-sensor alarms outdoors, in the bedroom and the kitchen - use seat cushions, floor mats and bed pads that are wired to alarm when getting up    Why is this important?    Safety is important when you or your loved one has dementia.   Eyesight, hearing and changes in feelings of hot and cold or depth-perception may occur.   Wandering or getting lost may happen.   You/your loved one may have to stop driving.   Taking steps to improve safety can prevent injuries.   It will also help you/your loved one feel more relaxed.   It will help you/your loved one be  independent for a longer time.     Assessment, press & current barriers:  Patient unable to consistently perform activities of daily living and needs additional assistance and support in order to meet this unmet need . Limited social support, ADL IADL limitations, Social Isolation, Limited access to caregiver, Cognitive Deficits, Memory Deficits, Inability to perform ADL's independently, and Inability to perform IADL's independently . Unable to self administer medications as prescribed . Lacks social connections  Clinical Goals:   Over the next 120 days, patient/caregiver will work with SW to address concerns related to care coordination needs, lack of  a stable support network and lack of Brewing technologist. LCSW will assist patient in gaining additional support in order to maintain health and mental health appropriately   Over the next 120 days, patient will demonstrate improved adherence to self care as evidenced by implementing healthy self-care into his daily routine such as: attending all medical appointments, deep breathing exercises, taking time for self-reflection, taking medications as prescribed, drinking water and daily exercise to improve mobility and mood.   Over the next 120 days, patient will demonstrate improved health management independence as evidenced by implementing healthy self-care skills and positive support/resource implementation into their daily routine to help cope with stressors and improve overall health and well-being   Over the next 120 days, patient or caregiver will verbalize basic understanding of depression/stress process and self health management plan as evidenced by his participation in development of long term plan of care and institution of self health management strategies  Interventions : . Assessed needs, level of care concerns, basic eligibility and provided education on the entire LTC placement process.  . Daughter reports that  patient is actively involved with [place]. A nurse comes out twice per week and the aide comes three times per week.  . Patient has Lewy Body Dementia and is in need of a higher level of care/support. Family prefers LTC placement at this time. Reviewed community support options ( CAP, private pay, PACE program) . Patient was informed that current CCM LCSW will be leaving position next month and her next CCM Social Work follow up visit will be with another LCSW. Patient was appreciative of support provided and receptive to news . Will collaborate with primary care provider regarding LTC placement. PCP is aware that several facilities have declined patient for placement due to his chronic health care needs.  . Daughter reports that they received a foyer lift because patient is unable to use his left side of the body and is unable to ambulate out of the bed. . Daughter reports that family applied for Special Assistance Medicaid but several facilities with memory care that they were looking into were only able to accept patients under private pay for 3 years until they would accept him for Medicaid. UPDATE- Daughter is going to apply for long term care Medicaid (for skilled nursing) today on 07/24/20. Daughter was advised to contact DSS to advise them to disregard the Special Assistance Medicaid application but to consider the LTC application. Daughter will contact DSS today. Daughter has been talking with the admissions coordinator at [facility] regarding their long term care program. UPDATE 08/21/20- Daughter reports that she successfully received a reference number for Medicaid and will write it down to provide to [facility]. [facility] has a wait list for Medicaid beds but family still wishes to attempt placement. Patient will complete office visit with PCP tomorrow and will complete FL2 and PPD. Family will turn in financial application to [facility] and then they will review his case for  enrollment. Family does NOT want patient going to either [facility] or [facility]. [facility] will complete [facility]- waiting on FL2, PPD. Turn in financial application. Review and do their assessment.. Is a wait list. skilled. [facility] not want [facility]. [facility] not [facility] him. Per daughter, [facility] didn't have enough staff to accept patient either. [facility] no longer has memory care. Daughter even called several facilities in [place] but  they would not accept patient due to his chronic disease management needs. CCM LCSW advised daughter to contact [facility] as they have SNF. CCM LCSW sent daughter a list of Skilled Nursing Facilities within [place], [place]. However, [place] social worker has already been unable to secure SNF placement for patient as several facilities declined him as well in [facility] UPDATE 09/18/20- Patient was declined for [place] Medicaid because of his assets. Daughter reports that she never heard back from several assisted living facilities after they received patient's FL2 form. However, family report that patient is doing a lot better physically and mentally. [name] hallucinations have decreased. Daughter reports that patient has more energy and is more alert. Patient is even helping the family out when he can.  . HH ended because patient was not making progress with therapy.  . Email sent to daughter with a list of long term care facilities within her area.  . Patient interviewed and appropriate assessments performed . Referred patient in the past to community resource care guide team for assistance with in home support through [facility] . Provided mental health counseling with regard to caregiver strain . Discussed plans with patient for ongoing care management follow up and provided patient with direct contact information for care management team . Assisted  patient/caregiver with obtaining information about health plan benefits . Provided education and assistance to client regarding Advanced Directives. . Provided education to patient/caregiver regarding level of care options. . Provided education to patient/caregiver about Hospice and/or Palliative Care services . 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 Marland Kitchen Patient completed pulmonary appointment today on 09/18/20 and physician said that his lungs looked good and clear.  . Other interventions provided: Solution-Focused Strategies   Follow Up Plan:  . SW will follow up with patient by phone over the next month . Closing care plan on behalf of the LCSW. Patient now is admitted to Hospice care services     Task: Recognize and Manage Caregiver Stress Completed 10/11/2020  Note:   Care Management Activities:    - active listening utilized - caregiver stress acknowledged - complementary therapy use encouraged - consideration of in-home help encouraged - decision-making supported - engagement with primary care provider encouraged - healthy lifestyle promoted - mental health treatment facilitated - participation in support group encouraged - positive reinforcement provided - self-reflection promoted - social and community activities encouraged - spiritual activity use promoted - support group information provided - verbalization of feelings encouraged    Notes: Closing on behalf of LCSW. The patient is now under hospice services    Patient Care Plan: RNCM: Lewey Body Dementia (Adult)    Problem Identified: RNCM: Management of Lewey Body Dementia   Priority: High    Long-Range Goal: RNCM: Behavior Symptoms Management Completed 10/11/2020  Expected End Date: 10/12/2020  Priority: High  Note:   CARE PLAN ENTRY (see longtitudinal plan of care for additional care plan information)  Current Barriers: Closing this goal. The patient is now under Hospice care services and CCM  services has ended. Confirmed with pcp.  . Chronic Disease Management support, education, and care coordination needs related to Dementia  Clinical Goal(s) related to Dementia: Lewey Body Dementia Over the next 120 days, patient will:  . Work with the care management team to address educational, disease management, and care coordination needs  . Begin or continue self health monitoring activities as directed today  support and education for patient and caregiver  with diagnosis of Lewey body dementia and decline in health . Call provider office for new or worsened signs and symptoms New or worsened symptom related to dementia and changes in conditions . Call care management team with questions or concerns . Verbalize basic understanding of patient centered plan of care established today  Interventions related to Dementia:  . Evaluation of current treatment plans and patient's adherence to plan as established by provider.  06-14-2020: The patient saw pcp on 06-11-2020. Referral for CCM team support. The patient has been living with the daughter and her family since October. His wife is actively taking radiation treatments at this time and due to decline in the patients health the daughter has moved the patient in with her and her family. The patient saw neurologist today and further testing will be done by an in-home study and a sub referral for additional diagnosis and treatment options. 07-19-2020: The patient has had a further decline in dementia and is aggressive at times. He pulls his diaper off and is wetting the bed. His daughter has ask the other provider about getting a hoyer lift as she has hurt herself when trying to lift the patient. The patient has become totally dependent on his daughter for feeding, bathroom needs and all care. The daughter is open for recommendations as home health stopped coming as of yesterday due to the patient not progressing in his care. 08-16-2020: The patient continues  to decline. He is falling more, he is not remembering his family, having issues with swallowing. The daughter is experiencing care giver burnout. She is trying to keep him in her home but it is getting harder each day. She is overwhelmed and does not know what to do to help the patient. Empathetic listening and support. Call to LCSW to assist with collaboration and help for the patient and patients caregiver. LCSW will email additional resources and also move appointment up to call the patients daughter sooner.  . Assessed patient understanding of disease states.  The daughter verbalized the patient does not fully understand what is going on but goes to appointments, eats well, takes medications, and is working with OT/PT in the home. An aide come 2 times a week and bathes the patient.  07-19-2020: The patient has had further decline in his condition. She called memory care places but they have recommended skilled nursing. Will collaborate with LCSW and pcp. 08-16-2020: The patient continues to decline. The daughter has talked to several agencies and there is a waiting list for 6 months or greater. Increased fall risk as the patient continues to fall out of the bed and out of his wheelchair. Advised the daughter if the patient has a fall again to have him evaluated and ask for physical therapy referral.  . Assessed patient's education and care coordination needs.  The daughter spoke to the LCSW earlier today and care guide referral made. RNCM reviewed the role of the CCM team and support of the CCM team in the help of the patient and caregiver meeting the healthy and wellness needs of the patient. 1--27-2022: Provided information to the patient daughter about [facility] helping with incontinence supplies. Also sent images of washable pads to the daughter to consider to help with patient and having incontinence episodes. The daughter is open to recommendations for help as she only has her husband helping her and he is  a truck driver and not always home. 08-16-2020: The daughter is seeking any help that she can get to get  the help the patient needs.  She is concerned over her own health and safety. She is appreciative of the CCM team and support. She is willing to do what she can to help the patient get the care he needs.  . Provided disease specific education to patient.  Education given on resources available and how to contact the RNCM. Education on follow up calls and reaching out to the [facility] for new concerns before next outreach. 07-19-2020: Additional information provided to discuss with [facility] at upcoming visit the hospice benefit and instructed the patients daughter to write down questions to ask about recommendations. Will discuss with the pcp about a new FL2.  The patients daughter is aware that the pcp will be out of the office until 08-01-2020. 08-16-2020: The daughter is taking the patient to several upcoming appointments. The patient has a swallowing evaluation next week, sees pcp for paperwork and TB testing. Additional information provided today via email for long term placement facilities.  [place] with appropriate clinical care team members regarding patient needs.  The patient is currently working with the LCSW. Will refer the pharmacist for help with medication cost constraints. 07-19-2020: Co-collaboration with the LCSW on resources for LTC facility information and help. The LCSW plans to reach out to the patients daughter next week also. 08-16-2020: Daughter tearful on the phone with the [facility]. Ask for suggestions to respite care.  Co-collaboration by phone with the LCSW.  . - action plan for worsening symptoms mutually developed . - Financial risk analyst provided . - symptom review completed . -safety reviewed- the family uses baby monitors and knows when the patient attempts to get out of bed . -medication safety reviewed- the daughter given the patient his medications and manages his  medications.   Patient Self Care Activities related to Dementia:  . Patient is unable to independently self-manage chronic health conditions  09-13-2020 at 1:45 pm  10-11-2020: The patients daughter is working with Hospice now. The patient is actually doing pretty good at this time. The patient has multiple chronic conditions but is stable at this time. Review of working with hospice and CCM services ending at this time. The daughter has the [facility] number for questions or concerns.    Task: RNCM: Develop Strategies to Manage Behavior Completed 10/11/2020  Note:   Care Management Activities:    - action plan for worsening symptoms mutually developed - community resource information provided - symptom review completed    Notes: The patient is going to have further testing to evaluate dementia- saw neurologist today 10-11-2020: Is now enrolled in Hospice, CCM services completed.    Problem Identified: Caregiver Stress     Problem Identified: Harm or Injury     Patient Care Plan: RNCM: Management of Diabetes Type 2 (Adult)    Problem Identified: RNCM: Glycemic Management (Diabetes, Type 2)   Priority: Medium    Goal: RNCM: Management of DM Completed 10/11/2020  Priority: Medium  Note:   Objective:  Lab Results  Component Value Date   HGBA1C 5.9 (H) 06/05/2020 .   Lab Results  Component Value Date   CREATININE 1.94 (H) 06/06/2020   CREATININE 1.86 (H) 06/06/2020   CREATININE 1.79 (H) 06/04/2020 .   Marland Kitchen No results found for: EGFR Current Barriers:  Marland Kitchen Knowledge Deficits related to basic Diabetes pathophysiology and self care/management . Knowledge Deficits related to medications used for management of diabetes . Limited Social Support . Unable to independently manage DM as evidence of advancing dementia  and inability to manage care without the help of his daughter . Unable to self administer medications as prescribed . Unable to perform ADLs independently . Unable to perform IADLs  independently Case Manager Clinical Goal(s):  Marland Kitchen Over the next 120 days, patient will demonstrate improved adherence to prescribed treatment plan for diabetes self care/management as evidenced by:  . daily monitoring and recording of CBG  . adherence to ADA/ carb modified diet . adherence to prescribed medication regimen Interventions:  . Provided education to patient about basic DM disease process . Reviewed medications with patient and discussed importance of medication adherence . Discussed plans with patient for ongoing care management follow up and provided patient with direct contact information for care management team . Provided patient with written educational materials related to hypo and hyperglycemia and importance of correct treatment . Advised patient, providing education and rationale, to check cbg daily and record, calling pcp for findings outside established parameters.   . Review of patient status, including review of consultants reports, relevant laboratory and other test results, and medications completed. . - A1C testing facilitated . - barriers to adherence to treatment plan identified . - blood glucose readings reviewed . - resources required to improve adherence to care identified . - self-awareness of signs/symptoms of hypo or hyperglycemia encouraged Patient Goals/Self-Care Activities . Over the next 120 days, patient will:  - UNABLE to independently manage DM Self administers oral medications as prescribed Attends all scheduled provider appointments Checks blood sugars as prescribed and utilize hyper and hypoglycemia protocol as needed Adheres to prescribed ADA/carb modified Follow Up Plan: Telephone follow up appointment with care management team member scheduled for: 09-13-2020 at 1:45 pm   Task: RNCM: Alleviate Barriers to Glycemic Management Completed 10/11/2020  Note:   Care Management Activities:    - A1C testing facilitated - barriers to adherence to  treatment plan identified - blood glucose readings reviewed - resources required to improve adherence to care identified - self-awareness of signs/symptoms of hypo or hyperglycemia encouraged  Enrolled in Hospice services, CCM services closed     Patient Care Plan: RNCM:Hypertension (Adult)    Problem Identified: Hypertension (Hypertension)     Goal: RNCM: Management of HTN Completed 10/11/2020  Expected End Date: 10/12/2020  Priority: Medium  Note:   Objective: Enrolled in Hospice services. CCM service completed.  . Last practice recorded BP readings:  . BP Readings from Last 3 Encounters: .  08/08/20 . (!) 122/58 .  07/22/20 . (!) 166/99 .  06/28/20 . (!) 153/65 .    Marland Kitchen Most recent eGFR/CrCl: No results found for: EGFR  No components found for: CRCL Current Barriers:  Marland Kitchen Knowledge Deficits related to basic understanding of hypertension pathophysiology and self care management . Knowledge Deficits related to understanding of medications prescribed for management of hypertension . 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 Unable to independently HTN as evidence of decline in health and having to move in with daughter to assist with care.  Leodis [place] social connections . Unable to perform ADLs independently . Unable to perform IADLs independently Case Manager Clinical Goal(s):  Marland Kitchen Over the next 120 days, patient will verbalize understanding of plan for hypertension management . Over the next 120 days, patient will demonstrate improved adherence to prescribed treatment plan for hypertension as evidenced by taking all medications as prescribed, monitoring and recording blood pressure as directed, adhering to low sodium/DASH diet . Over the next 120 days, patient will demonstrate improved health  management independence as evidenced by checking blood pressure as directed and notifying PCP if SBP>160 or DBP > 90, taking all medications as prescribe, and adhering  to a low sodium diet as discussed. Interventions:  Marland Kitchen UNABLE to independently:manage health and wellness due to decline in memory and recent moving in with his daughter to assist with his care . Evaluation of current treatment plan related to hypertension self management and patient's adherence to plan as established by provider. . Provided education to patient re: stroke prevention, s/s of heart attack and stroke, DASH diet, complications of uncontrolled blood pressure . Reviewed medications with patient and discussed importance of compliance. Pharmacist referral for questions and financial help for Xarelto  . Discussed plans with patient for ongoing care management follow up and provided patient with direct contact information for care management team . Review of dietary habits. The patient is having to be feed now as he can not see to feed himself. The daughter is having a hard time with caring for the patient.  . Advised patient, providing education and rationale, to monitor blood pressure daily and record, calling PCP for findings outside established parameters.  . - blood pressure trends reviewed . - depression screen reviewed . - home or ambulatory blood pressure monitoring encouraged Patient Goals/Self-Care Activities . Over the next 120 days, patient will:  - Self administers medications as prescribed Checks BP and records as discussed Follows a low sodium diet/DASH diet Follow Up Plan: Telephone follow up appointment with care management team member scheduled for: 09-13-2020 at 1:45 pm   Task: RNCM: Identify and Monitor Blood Pressure Elevation Completed 10/11/2020  Note:   Care Management Activities:    - blood pressure trends reviewed - depression screen reviewed - home or ambulatory blood pressure monitoring encouraged      Patient Care Plan: PharmD - Medication Assistance    Problem Identified: Disease Progression     Long-Range Goal: Disease Progression Prevented or  Minimized Completed 09/03/2020  Start Date: 06/18/2020  Expected End Date: 09/16/2020  Recent Progress: On track  Priority: High  Note:   Current Barriers:  . Unable to independently afford treatment regimen  Pharmacist Clinical Goal(s):  Marland Kitchen Over the next 90 days, patient will verbalize ability to afford treatment regimen through collaboration with PharmD and provider.   Interventions: . 1:1 collaboration with [name], DO regarding development and update of comprehensive plan of care as evidenced by provider attestation and co-signature . Inter-disciplinary care team collaboration (see longitudinal plan of care) . Perform chart review o Patient seen by PCP on 3/2. Provider - Referred patient to [facility]iology for consultation on HFpEF, scheduled for 3/15 - Planned to complete FL2, and Application paperwork for [facility] placement for LTC o Office Visit with Pulmonology on 2/16. Pulmonologist advised patient to discontinue use of Symbicort inhaler (as not needed) o Office Visit with Neurology on 2/15 for follow up including regarding cognitive impairment, spasticity in left arm and leg, seizure-like episodes  . Note patient currently followed by Palliative Care for home visits  Medication Assistance . Note daughter previously reported patient denied for Extra Help through [employer]. Have counseled daughter on importance of retaining denial letter . Caregiver previously requesting assistance with cost of inhalers for patient.  o Per review of notes from Office Visit with Pulmonology on 2/16, patient advised to discontinue use of Symbicort inhaler (as not needed) . Xarelto o Have reviewed daughter again the criteria for applying for patient assistance for Xarelto,  including program 4% out of pocket expenditure requirement o Daughter confirms will follow up with CM Pharmacist when close to meeting out of pocket expenditure requirement for 2022  Hypertension/  HFpEF:  PCP has referred patient to [facility] Cardiology for consultation on HFpEF, scheduled for 3/15  Reports patient taking:   metoprolol ER 50 mg daily  torsemide 20 mg daily as needed  Note patient has home automatic and manual blood pressure monitors  Have encouraged to monitor blood pressure at home, keep log and bring log to medical appointments  Daughter denies questions/concerns for CM Pharmacist with other medications at this time  Patient Goals/Self-Care Activities . Over the next 90 days, patient will:  - collaborate with provider on medication access solutions - attend medical appointments as scheduled  Follow Up Plan: Caregiver has been provided with contact information for CM Pharmacist and advised to call when close to meeting out of pocket expenditure requirement for 2022 or for other medication questions/concerns     Patient verbalizes understanding of instructions provided today and agrees to view in [place].   No further follow up required: the patient is now enrolled in a Hospice program. CCM services completed.    Noreene Larsson RN, MSN, [place] Mobile: [phone]

## 2020-10-15 ENCOUNTER — Ambulatory Visit: Payer: Medicare Other | Admitting: Podiatry

## 2020-10-15 ENCOUNTER — Other Ambulatory Visit: Payer: Self-pay

## 2020-10-15 ENCOUNTER — Encounter (INDEPENDENT_AMBULATORY_CARE_PROVIDER_SITE_OTHER): Payer: Self-pay

## 2020-10-15 ENCOUNTER — Encounter: Payer: Self-pay | Admitting: Podiatry

## 2020-10-15 ENCOUNTER — Telehealth: Payer: Self-pay

## 2020-10-15 DIAGNOSIS — B351 Tinea unguium: Secondary | ICD-10-CM | POA: Diagnosis not present

## 2020-10-15 DIAGNOSIS — M79609 Pain in unspecified limb: Secondary | ICD-10-CM | POA: Diagnosis not present

## 2020-10-15 DIAGNOSIS — N183 Chronic kidney disease, stage 3 unspecified: Secondary | ICD-10-CM

## 2020-10-15 DIAGNOSIS — E1122 Type 2 diabetes mellitus with diabetic chronic kidney disease: Secondary | ICD-10-CM | POA: Diagnosis not present

## 2020-10-15 DIAGNOSIS — D689 Coagulation defect, unspecified: Secondary | ICD-10-CM | POA: Diagnosis not present

## 2020-10-15 DIAGNOSIS — I5030 Unspecified diastolic (congestive) heart failure: Secondary | ICD-10-CM

## 2020-10-15 DIAGNOSIS — N184 Chronic kidney disease, stage 4 (severe): Secondary | ICD-10-CM | POA: Diagnosis not present

## 2020-10-15 NOTE — Progress Notes (Signed)
This patient returns to my office for at risk foot care.  This patient requires this care by a professional since this patient will be at risk due to having  Venous insufficiency , DVT, Diabetes and  CKD-4  This patient is unable to cut nails himself since the patient cannot reach his nails.These nails are painful walking and wearing shoes.  This patient presents for at risk foot care today.  Patient is taking xarelto. This patient is brought to the office in a wheelchair accompanied by caregiver.  General Appearance  Alert, conversant and in no acute stress.  Vascular  Dorsalis pedis  pulses and posterior tibial pulses are absent due to severe swelling. Bilateral.  Capillary return is within normal limits  bilaterally. Cold feet bilaterally.  Absent digital hair bilateral.  Neurologic  Senn-Weinstein monofilament wire test within normal limits  bilaterally. Muscle power within normal limits bilaterally.  Nails Thick disfigured discolored nails with subungual debris  from hallux to fifth toes bilaterally. No evidence of bacterial infection or drainage bilaterally.  Orthopedic  No limitations of motion  feet .  No crepitus or effusions noted.  No bony pathology or digital deformities noted.  Skin  normotropic skin with no porokeratosis noted bilaterally.  No signs of infections or ulcers noted.     Onychomycosis  Pain in right toes  Pain in left toes  Consent was obtained for treatment procedures.   Mechanical debridement of nails 1-5  bilaterally performed with a nail nipper.  Filed with dremel without incident. No infection or ulcer.     Return office visit   3   months                   Told patient to return for periodic foot care and evaluation due to potential at risk complications.   Gardiner Barefoot DPM

## 2020-10-15 NOTE — Telephone Encounter (Signed)
Received the following MyChart message as seen below from patients daughter. I called her back and moved patients appoint from 11/02/20 to 10/22/20 a week from today. Will route to Dr. Garen Lah for an [place].  I have reached out to my dad's vascular doctor and I wanted to let you know as well that dad has been having an increased amount of bilateral swelling. We were doing the fluid pill 4/week but decreased to 3/week due to him becoming dehydrated. He wears his compression socks every day and we are using the compression sleeve on most days 2/day but the fluid seems to keep coming back and it is to the point where it is becoming hard for him to move.   Are there any recommendations of what we need to do or does he have to come in for a visit?

## 2020-10-16 ENCOUNTER — Encounter: Payer: Self-pay | Admitting: Pulmonary Disease

## 2020-10-16 NOTE — Telephone Encounter (Signed)
Called and spoke with patients daughter and gave the following recommendations:  Patient/family increased dose of torsemide to 20 mg 4 times a week as previously recommended. Check BMP in 5 days. Keep follow-up appointment. Thank you   Routing comment    Daughter agreed with plan and was grateful for the call back.

## 2020-10-16 NOTE — Progress Notes (Deleted)
Subjective:    [name], male    DOB: [birth year], 80 y.o.   MRN: [id number]  HPI Chief Complaint  Patient presents with  . pulmonary consult    Per Dr. Karamalegos--c/o sob with exertion, prod cough with clear sputum and wheezing. Covid 06/2018   HPI Patient is a 80 year old remote former smoker who is kindly referred by Dr. Parks Ranger for the issue of shortness of breath on exertion.  He also has issues with productive cough of clear sputum and wheezing.  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  He does have issues with dysphagia.  He is followed by neurology at [facility].  He presents today with children present.  They are very engaged in his care.  He does have sleep apnea and uses CPAP nightly and appears to do well with it.  He has multiple issues as noted below.  There has been no fever, chills or sweats noted.  No other issues.  Family is considering palliative care there.  He has had some seizure-like activity for which he is on Keppra for.  He is not on ACE inhibitor's.  Notice any significant impact on Symbicort.   Review of Systems A 10 point review of systems was performed and it is as noted above otherwise negative.  Review of systems is limited and mostly provided by patient's children.   Past Medical History:  Diagnosis Date  . Arthritis   . Bilateral swelling of feet   . BPH (benign prostatic hyperplasia)   . Chronic kidney disease   . DVT (deep venous thrombosis) (HCC)    right lower extremity  . History of gout   . Seizures ([place])   . Sleep apnea   . Tendonitis of foot    left   Past Surgical History:  Procedure Laterality Date  . APPENDECTOMY    . APPLICATION OF WOUND VAC Right 11/02/2019   Procedure: APPLICATION OF WOUND VAC;  Surgeon: Dereck Leep, MD;  Location: [facility] ORS;  Service: Orthopedics;  Laterality: Right;  [id number]  . HERNIA REPAIR     UMBILICAL  . IVC  FILTER INSERTION N/A 10/25/2019   Procedure: IVC FILTER INSERTION;  Surgeon: Katha Cabal, MD;  Location: [facility];  Service: Cardiovascular;  Laterality: N/A;  . KNEE ARTHROPLASTY Right 11/02/2019   Procedure: COMPUTER ASSISTED TOTAL KNEE ARTHROPLASTY;  Surgeon: Dereck Leep, MD;  Location: [facility] ORS;  Service: Orthopedics;  Laterality: Right;  . PATELLAR TENDON REPAIR Right 12/05/2019   Procedure: PATELLA TENDON REPAIR;  Surgeon: Dereck Leep, MD;  Location: [facility] ORS;  Service: Orthopedics;  Laterality: Right;  . PROSTATE SURGERY     Patient Active Problem List   Diagnosis Date Noted  . Parkinsonism ([place]) 08/23/2020  . Heart failure with preserved ejection fraction, borderline, class II ([place]) 08/23/2020  . Slurred speech 06/04/2020  . CKD (chronic kidney disease) stage 3, GFR 30-59 ml/min (HCC) 06/04/2020  . Essential hypertension 06/04/2020  . Hyperlipidemia 06/04/2020  . Lewy body dementia without behavioral disturbance ([place]) 05/21/2020  . Hyperlipidemia associated with type 2 diabetes mellitus ([place]) 05/21/2020  . Status post total right knee replacement 01/22/2020  . Anemia of chronic kidney failure, stage 3 (moderate) ([place]) 01/16/2020  . Total knee replacement status 11/02/2019  . History of total knee arthroplasty 11/02/2019  . PAD (peripheral artery disease) ([place]) 10/13/2019  . Benign prostatic hyperplasia   . Seizures ([place])   .  Anemia due to stage 4 chronic kidney disease ([place]) 06/24/2019  . Generalized weakness 06/24/2019  . Anemia in chronic kidney disease ([place]) 06/24/2019  . Asthenia 06/24/2019  . Primary osteoarthritis of right knee 04/03/2019  . Use of cane as ambulatory aid 04/27/2018  . Dependence on other enabling machines and devices 04/27/2018  . Type 2 diabetes mellitus with stage 4 chronic kidney disease, without long-term current use of insulin ([place]) 06/11/2017  . Lymphedema 05/04/2016  . Chronic venous insufficiency 05/04/2016  . Toe  sprain 01/24/2016  . Morbid obesity with body mass index of 40.0-44.9 in adult [facility]) 05/24/2014  . Body mass index (BMI)40.0-44.9, adult 05/24/2014  . OSA on CPAP 01/10/2014  . CKD (chronic kidney disease), stage IV ([place]) 11/22/2013  . Allergic rhinitis 11/06/2013  . Benign hypertension with CKD (chronic kidney disease) stage IV ([place]) 11/06/2013  . Epilepsy ([place]) 11/06/2013  . Gout 11/06/2013  . History of DVT (deep vein thrombosis) 11/06/2013  . Chronic deep vein thrombosis (DVT) of proximal vein of lower extremity ([place]) 11/06/2013  . 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 Personal history of other venous thrombosis and embolism 11/06/2013   Family History  Problem Relation Age of Onset  . Arthritis Mother   . Hypertension Mother   . Diabetes Brother    Social History   Tobacco Use  . Smoking status: Former Smoker    Packs/day: 1.00    Years: 10.00    Pack years: 10.00    Types: Cigarettes    Quit date: 06/23/1968    Years since quitting: 52.3  . Smokeless tobacco: Never Used  Substance Use Topics  . Alcohol use: No   Allergies  Allergen Reactions  . Shellfish Allergy Anaphylaxis and Other (See Comments)    gout  . Iodinated Diagnostic Agents Other (See Comments)    Chronic kidney disease 3b  . Nsaids Other (See Comments)    Chronic kidney disease 3b  . Rivastigmine Other (See Comments)    Side effect and not a true allergy   Current Meds  Medication Sig  . albuterol (VENTOLIN HFA) 108 (90 Base) MCG/ACT inhaler Inhale 2 puffs into the lungs every 4 (four) hours as needed for wheezing.  Marland Kitchen allopurinol (ZYLOPRIM) 100 MG tablet Take 100 mg by mouth daily.  Marland Kitchen atorvastatin (LIPITOR) 20 MG tablet Take 1 tablet (20 mg total) by mouth at bedtime.  . docusate sodium (COLACE) 100 MG capsule Take 100 mg by mouth daily as needed for mild constipation or moderate constipation.   . fexofenadine (ALLEGRA)  180 MG tablet Take 180 mg by mouth daily as needed for allergies.   . fluticasone (FLONASE) 50 MCG/ACT nasal spray Place 2 sprays into both nostrils daily as needed for allergies.   Marland Kitchen levETIRAcetam (KEPPRA) 500 MG tablet Take 500 mg by mouth 2 (two) times daily.   . Magnesium 250 MG TABS Take 250 mg by mouth daily.  . metoprolol succinate (TOPROL-XL) 50 MG 24 hr tablet Take 50 mg by mouth daily.   . Multiple Vitamin (ONE-A-DAY MENS PO) Take 1 tablet by mouth daily.   . [DISCONTINUED] lamoTRIgine (LAMICTAL) 25 MG tablet Take 6 tablets (150 mg total) by mouth 2 (two) times daily Follow titration instructions given in office by your neurology.  . [DISCONTINUED] melatonin 5 MG TABS Take 5 mg by mouth at bedtime as needed (sleep).  . [DISCONTINUED] XARELTO 10 MG TABS tablet Take 1 tablet (10  mg total) by mouth daily before breakfast.   Immunization History  Administered Date(s) Administered  . Influenza Inj Mdck Quad Pf 05/03/2019  . Influenza Split 05/24/2014  . Influenza,inj,Quad PF,6+ Mos 03/13/2020  . Influenza-Unspecified 03/31/2017, 05/03/2019  . PFIZER(Purple Top)SARS-COV-2 Vaccination 08/08/2019, 08/30/2019, 04/09/2020  . PPD Test 08/20/2020  . Pneumococcal Conjugate-13 05/23/2016  . Pneumococcal Polysaccharide-23 12/22/2010  . Zoster 01/31/2015      Objective:   Physical Exam BP (!) 122/58 (BP Location: Left Arm, Cuff Size: Normal)   Pulse 65   Temp 98.6 F (37 C) (Temporal)   Ht 6' (1.829 m)   Wt 297 lb (134.7 kg) Comment: weight is per patient  SpO2 98%   BMI 40.28 kg/m  GENERAL: Obese gentleman, no acute distress, presents in transport chair.  Occasional throat clearing. HEAD: Normocephalic, atraumatic.  EYES: Pupils equal, round, reactive to light.  No scleral icterus.  MOUTH: Nose/mouth/throat not examined due to masking requirements for COVID 19. NECK: Supple. No thyromegaly. Trachea midline. No JVD.  No adenopathy. PULMONARY: Good air entry bilaterally.  Coarse, no  rhonchi otherwise no adventitious sounds. CARDIOVASCULAR: S1 and S2. Regular rate and rhythm.  ABDOMEN: Obese, otherwise benign. MUSCULOSKELETAL: No joint deformity, no clubbing, no edema.  NEUROLOGIC: No overt focal deficit, cannot assess gait, in transport chair. SKIN: Intact,warm,dry. PSYCH: Flat affect, no psychomotor retardation noted.       Assessment & Plan:     ICD-10-CM   1. Dysphagia, unspecified type  R13.10 SLP modified barium swallow    DG OP Swallowing Func-Medicare/Speech Path   Evaluate with modified barium swallow  2. Cough  R05.9    Suspect related to dysphagia Query chronic aspiration  3. OSA on CPAP  G47.33 Pulse oximetry, overnight   Z99.89    Evaluate need for O2 ONOX on CPAP   Orders Placed This Encounter  Procedures  . DG OP Swallowing Func-Medicare/Speech Path    Standing Status:   Future    Number of Occurrences:   1    Standing Expiration Date:   08/08/2021    Order Specific Question:   Reason for Exam (SYMPTOM  OR DIAGNOSIS REQUIRED)    Answer:   Dysphagia    Order Specific Question:   Where should this test be performed?    Answer:   [facility])  . Pulse oximetry, overnight    ONO on cpap  EQA:STMHD    Standing Status:   Future    Standing Expiration Date:   08/08/2021  . SLP modified barium swallow    Standing Status:   Future    Standing Expiration Date:   08/08/2021    Order Specific Question:   Where should this test be performed:    Answer:   Other    Comments:   [facility]   Likely related to issues with dysphagia which are common with Parkinson's disease.  It appears that family is interested in pursuing palliative care.  It is difficult to evaluate dyspnea on exertion as he cannot exert and is pretty much sedentary.  He does not appear to be limited by this according to the children.  I would recommend discontinuing Symbicort as it does not appear to be impacting in his symptoms.  Will see him in follow-up in 4 to 6 weeks time  he is to contact us prior to that time should any new difficulties arise.   Renold Don, MD [facility]   *This note was dictated using voice recognition software/Dragon.  Despite best efforts to proofread, errors can occur which can change the meaning.  Any change was purely unintentional.

## 2020-10-16 NOTE — Addendum Note (Signed)
Addended by: Kavin Leech on: 10/16/2020 10:55 AM   Modules accepted: Orders

## 2020-10-17 ENCOUNTER — Telehealth: Payer: Self-pay

## 2020-10-18 ENCOUNTER — Ambulatory Visit: Admitting: Dermatology

## 2020-10-18 ENCOUNTER — Other Ambulatory Visit: Payer: Self-pay

## 2020-10-18 DIAGNOSIS — L304 Erythema intertrigo: Secondary | ICD-10-CM | POA: Diagnosis not present

## 2020-10-18 MED ORDER — KETOCONAZOLE 2 % EX CREA: TOPICAL_CREAM | CUTANEOUS | 2 refills | Status: AC

## 2020-10-18 MED ORDER — FLUCONAZOLE 200 MG PO TABS: ORAL_TABLET | ORAL | 1 refills | Status: AC

## 2020-10-18 NOTE — Patient Instructions (Signed)

## 2020-10-18 NOTE — Progress Notes (Signed)
   New Patient Visit  Subjective  Justin Shannon. is a 80 y.o. male who presents for the following: Lesions (Of the abdomen, groin, thighs, buttocks - patient scratches at lesions ).  There is a rash and weeping of the groin history.  The patient is not very mobile and sits in a wheelchair and has had a knee replacement that is not working well.  The following portions of the chart were reviewed this encounter and updated as appropriate:   Tobacco  Allergies  Meds  Problems  Med Hx  Surg Hx  Fam Hx     Review of Systems:  No other skin or systemic complaints except as noted in HPI or Assessment and Plan.  Objective  Well appearing patient in no apparent distress; mood and affect are within normal limits.  A focused examination was performed including the groin, abdomen. Relevant physical exam findings are noted in the Assessment and Plan.  Objective  Pubic: Hyperpigmentation and dyschromia    Assessment & Plan  Erythema intertrigo Pubic; groin and genital and perianal area. Chronic, persistent - advised patient, daughter, and son in law that condition is treatable, but is not curable and will recur.   Intertrigo is a chronic recurrent rash that occurs in skin fold areas that may be associated with friction; heat; moisture; yeast; fungus; and bacteria.  It is exacerbated by increased movement / activity; sweating; and higher atmospheric temperature.  Start Ketoconazole 2% cream QD,  Diflucan 200mg  po QD on M, W,F #12 1RF  Consider skin medicinals intertrigo treatment in the future if current regimen is not helpful.  ketoconazole (NIZORAL) 2 % cream - Pubic fluconazole (DIFLUCAN) 200 MG tablet - Pubic  Return in about 6 weeks (around 11/29/2020).  Luther Redo, CMA, am acting as scribe for Sarina Ser, MD .  Documentation: I have reviewed the above documentation for accuracy and completeness, and I agree with the above.  Sarina Ser, MD

## 2020-10-21 ENCOUNTER — Other Ambulatory Visit
Admission: RE | Admit: 2020-10-21 | Discharge: 2020-10-21 | Disposition: A | Discharge status: Home / Self Care | Attending: Cardiology | Admitting: Cardiology

## 2020-10-21 ENCOUNTER — Encounter: Payer: Self-pay | Admitting: Dermatology

## 2020-10-21 DIAGNOSIS — I5030 Unspecified diastolic (congestive) heart failure: Secondary | ICD-10-CM

## 2020-10-21 LAB — BASIC METABOLIC PANEL
Anion gap: 4 — ABNORMAL LOW (ref 5–15)
BUN: 48 mg/dL — ABNORMAL HIGH (ref 8–23)
CO2: 27 mmol/L (ref 22–32)
Calcium: 9.4 mg/dL (ref 8.9–10.3)
Chloride: 111 mmol/L (ref 98–111)
Creatinine, Ser: 2.19 mg/dL — ABNORMAL HIGH (ref 0.61–1.24)
GFR, Estimated: 30 mL/min — ABNORMAL LOW (ref >60)
Glucose, Bld: 87 mg/dL (ref 70–99)
Potassium: 6.1 mmol/L — ABNORMAL HIGH (ref 3.5–5.1)
Sodium: 142 mmol/L (ref 135–145)

## 2020-10-22 ENCOUNTER — Ambulatory Visit: Payer: Medicare Other | Admitting: Cardiology

## 2020-10-22 ENCOUNTER — Other Ambulatory Visit: Payer: Self-pay

## 2020-10-22 ENCOUNTER — Encounter: Payer: Self-pay | Admitting: Cardiology

## 2020-10-22 VITALS — BP 138/72 | HR 65 | Ht 70.5 in | Wt 322.0 lb

## 2020-10-22 DIAGNOSIS — I5189 Other ill-defined heart diseases: Secondary | ICD-10-CM | POA: Diagnosis not present

## 2020-10-22 DIAGNOSIS — R3916 Straining to void: Secondary | ICD-10-CM

## 2020-10-22 DIAGNOSIS — N401 Enlarged prostate with lower urinary tract symptoms: Secondary | ICD-10-CM | POA: Diagnosis not present

## 2020-10-22 DIAGNOSIS — I10 Essential (primary) hypertension: Secondary | ICD-10-CM | POA: Diagnosis not present

## 2020-10-22 MED ORDER — TORSEMIDE 20 MG PO TABS: 20.0000 mg | ORAL_TABLET | Freq: Every day | ORAL | 3 refills | Status: AC

## 2020-10-22 NOTE — Patient Instructions (Signed)
Medication Instructions:   Your physician has recommended you make the following change in your medication:   INCREASE your Torsemide to 20 MG by mouth daily.  *If you need a refill on your cardiac medications before your next appointment, please call your pharmacy*   Lab Work:  Your physician recommends that you return for lab work in: [place]  - Please go to the [facility]. You will check in at the front desk to the right as you walk into the atrium. Valet Parking is offered if needed. - No appointment needed. You may go any day between 7 am and 6 pm.   Testing/Procedures: None ordered   Follow-Up: At [facility], you and your health needs are our priority.  As part of our continuing mission to provide you with exceptional heart care, we have created designated Provider Care Teams.  These Care Teams include your primary Cardiologist (physician) and Advanced Practice Providers (APPs -  Physician Assistants and Nurse Practitioners) who all work together to provide you with the care you need, when you need it.  We recommend signing up for the patient portal called "MyChart".  Sign up information is provided on this After Visit Summary.  MyChart is used to connect with patients for Virtual Visits (Telemedicine).  Patients are able to view lab/test results, encounter notes, upcoming appointments, etc.  Non-urgent messages can be sent to your provider as well.   To learn more about what you can do with MyChart, go to [email or web address].    Your next appointment:   3-4 weeks   The format for your next appointment:   In Person  Provider:   Kate Sable, MD   Other Instructions

## 2020-10-22 NOTE — Progress Notes (Signed)
Cardiology Office Note:    Date:  10/22/2020   ID:  Justin Shannon., DOB 02/20/1941, MRN [id number]  PCP:  Karamalegos, Alexander J, [place]  Cardiologist:  Kate Sable, MD  Advanced Practice Provider:  No care team member to display Electrophysiologist:  None       Referring MD: Nobie Putnam *   Chief Complaint  Patient presents with  . other    2 month follow up - Patient c.o SOB and swelling. Meds reviewed verbally with patient.     History of Present Illness:    Justin Shannon. is a 80 y.o. male with a hx of CKD, HFpEF, BPH, hypertension, dementia, DVT on Xarelto, who presents due to edema and shortness of breath.  Previously seen due to volume overload, diastolic dysfunction.  Torsemide 20 mg 4 times weekly was prescribed.  Patient developed worsening dementia and torsemide dose was increased to 3 times daily.  This was associated with worsening lower extremity edema.  Daughter was advised to increase dose to 4 times weekly.  Patient endorsed straining while urinating, frequent urination about 8 times during the day, 4 times at night.  Has a history of BPH, has not followed up with urology in about a year.  Follows up at the [facility] clinic where lower extremity wraps, Unna boots are not being used.    Prior notes Echocardiogram 05/2020 preserved ejection fraction, EF over 31%, grade 2 diastolic dysfunction.    Previously on multiple antihypertensives, low blood pressures were noticed with systolic in the 51V, BP medications were stopped.  Currently takes Toprol-XL only.  Past Medical History:  Diagnosis Date  . Arthritis   . Bilateral swelling of feet   . BPH (benign prostatic hyperplasia)   . Chronic kidney disease   . DVT (deep venous thrombosis) (HCC)    right lower extremity  . History of gout   . Seizures ([place])   . Sleep apnea   . Tendonitis of foot    left    Past Surgical History:  Procedure  Laterality Date  . APPENDECTOMY    . APPLICATION OF WOUND VAC Right 11/02/2019   Procedure: APPLICATION OF WOUND VAC;  Surgeon: Dereck Leep, MD;  Location: [facility] ORS;  Service: Orthopedics;  Laterality: Right;  [id number]  . HERNIA REPAIR     UMBILICAL  . IVC FILTER INSERTION N/A 10/25/2019   Procedure: IVC FILTER INSERTION;  Surgeon: Katha Cabal, MD;  Location: [place] [facility];  Service: Cardiovascular;  Laterality: N/A;  . KNEE ARTHROPLASTY Right 11/02/2019   Procedure: COMPUTER ASSISTED TOTAL KNEE ARTHROPLASTY;  Surgeon: Dereck Leep, MD;  Location: [facility] ORS;  Service: Orthopedics;  Laterality: Right;  . PATELLAR TENDON REPAIR Right 12/05/2019   Procedure: PATELLA TENDON REPAIR;  Surgeon: Dereck Leep, MD;  Location: [facility] ORS;  Service: Orthopedics;  Laterality: Right;  . PROSTATE SURGERY      Current Medications: Current Meds  Medication Sig  . albuterol (VENTOLIN HFA) 108 (90 Base) MCG/ACT inhaler Inhale 2 puffs into the lungs every 4 (four) hours as needed for wheezing.  Marland Kitchen allopurinol (ZYLOPRIM) 100 MG tablet Take 100 mg by mouth daily.  Marland Kitchen atorvastatin (LIPITOR) 20 MG tablet Take 1 tablet (20 mg total) by mouth at bedtime.  . docusate sodium (COLACE) 100 MG capsule Take 100 mg by mouth daily as needed for mild constipation or moderate constipation.   . fexofenadine (ALLEGRA) 180 MG tablet Take 180  mg by mouth daily as needed for allergies.   . fluconazole (DIFLUCAN) 200 MG tablet Take one tab po QD on Monday, Wednesday, and Friday  . fluticasone (FLONASE) 50 MCG/ACT nasal spray Place 2 sprays into both nostrils daily as needed for allergies.   Marland Kitchen ketoconazole (NIZORAL) 2 % cream Apply to aa's groin QD.  Marland Kitchen levETIRAcetam (KEPPRA) 500 MG tablet Take 500 mg by mouth 2 (two) times daily.   Marland Kitchen LORazepam (ATIVAN) 0.5 MG tablet Take 0.5 mg by mouth every 4 (four) hours as needed.  . Magnesium 250 MG TABS Take 250 mg by mouth daily.  . metoprolol succinate (TOPROL-XL) 50 MG 24  hr tablet Take 50 mg by mouth daily.   . Multiple Vitamin (ONE-A-DAY MENS PO) Take 1 tablet by mouth daily.   . rivaroxaban (XARELTO) 10 MG TABS tablet TAKE ONE TABLET BY MOUTH EVERY DAY WITH BREAKFAST  . XARELTO 10 MG TABS tablet Take 1 tablet (10 mg total) by mouth daily before breakfast.  . [DISCONTINUED] torsemide (DEMADEX) 20 MG tablet Take 1 tab by mouth every Mon/Wed/Fri/Sat (4 days a week)     Allergies:   Shellfish allergy, Iodinated diagnostic agents, Nsaids, and Rivastigmine   Social History   Socioeconomic History  . Marital status: Married    Spouse name: Not on file  . Number of children: Not on file  . Years of education: Not on file  . Highest education level: Not on file  Occupational History  . Not on file  Tobacco Use  . Smoking status: Former Smoker    Packs/day: 1.00    Years: 10.00    Pack years: 10.00    Types: Cigarettes    Quit date: 06/23/1968    Years since quitting: 52.3  . Smokeless tobacco: Never Used  Vaping Use  . Vaping Use: Never used  Substance and Sexual Activity  . Alcohol use: No  . Drug use: No  . Sexual activity: Not on file  Other Topics Concern  . Not on file  Social History Narrative  . Not on file   Social Determinants of Health   Financial Resource Strain: Low Risk   . Difficulty of Paying Living Expenses: Not hard at all  Food Insecurity: No Food Insecurity  . Worried About Charity fundraiser in the Last Year: Never true  . Ran Out of Food in the Last Year: Never true  Transportation Needs: No Transportation Needs  . Lack of Transportation (Medical): No  . Lack of Transportation (Non-Medical): No  Physical Activity: Inactive  . Days of Exercise per Week: 0 days  . Minutes of Exercise per Session: 0 min  Stress: No Stress Concern Present  . Feeling of Stress : Only a little  Social Connections: Moderately Isolated  . Frequency of Communication with Friends and Family: More than three times a week  . Frequency of  Social Gatherings with Friends and Family: More than three times a week  . Attends Religious Services: Never  . Active Member of Clubs or Organizations: No  . Attends Archivist Meetings: Not on file  . Marital Status: Married     Family History: The patient's family history includes Arthritis in his mother; Diabetes in his brother; Hypertension in his mother.  ROS:   Please see the history of present illness.     All other systems reviewed and are negative.  EKGs/Labs/Other Studies Reviewed:    The following studies were reviewed today:   EKG:  EKG is  ordered today.  The ekg ordered today demonstrates normal sinus rhythm.  Recent Labs: 07/21/2020: ALT 30 07/22/2020: Hemoglobin 10.1; Platelets 186 10/21/2020: BUN 48; Creatinine, Ser 2.19; Potassium 6.1; Sodium 142  Recent Lipid Panel    Component Value Date/Time   CHOL 96 06/05/2020 0816   TRIG 81 06/05/2020 0816   HDL 57 06/05/2020 0816   CHOLHDL 1.7 06/05/2020 0816   VLDL 16 06/05/2020 0816   LDLCALC 23 06/05/2020 0816     Risk Assessment/Calculations:      Physical Exam:    VS:  BP 138/72 (BP Location: Right Arm, Patient Position: Sitting, Cuff Size: Large)   Pulse 65   Ht 5' 10.5" (1.791 m)   Wt (!) 322 lb (146.1 kg)   SpO2 (!) 82%   BMI 45.55 kg/m     Wt Readings from Last 3 Encounters:  10/22/20 (!) 322 lb (146.1 kg)  09/18/20 (!) 301 lb (136.5 kg)  09/04/20 (!) 301 lb (136.5 kg)     GEN:  Well nourished, well developed in no acute distress HEENT: Normal NECK: No JVD; No carotid bruits LYMPHATICS: No lymphadenopathy CARDIAC: RRR, no murmurs, rubs, gallops RESPIRATORY: Decreased breath sounds at bases ABDOMEN: Soft, non-tender, non-distended MUSCULOSKELETAL: 2+ edema; No deformity  SKIN: Warm and dry NEUROLOGIC:  Alert and oriented x 3 PSYCHIATRIC:  Normal affect   ASSESSMENT:    1. Diastolic dysfunction   2. Primary hypertension   3. Benign prostatic hyperplasia (BPH) with  straining on urination    PLAN:    In order of problems listed above:  1. Grade 2 diastolic dysfunction, edema noted on exam.  Increase torsemide to 20 mg daily, check BMP in 1 week.   not recommending any additional invasive cardiac testing in this gentleman due to significant comorbidities including decline in function, dementia requiring 24-hour care. 2. Hypertension, BP normal.  Toprol-XL, torsemide. 3. History of BPH, frequent urination, straining with urination.  Follow-up with urology regarding obstructive phenomenon.  Follow-up in 3 to 4 weeks.     Medication Adjustments/Labs and Tests Ordered: Current medicines are reviewed at length with the patient today.  Concerns regarding medicines are outlined above.  Orders Placed This Encounter  Procedures  . Basic metabolic panel  . EKG 12-Lead   Meds ordered this encounter  Medications  . torsemide (DEMADEX) 20 MG tablet    Sig: Take 1 tablet (20 mg total) by mouth daily. Take 1 tab by mouth every Mon/Wed/Fri/Sat (4 days a week)    Dispense:  30 tablet    Refill:  3    Patient Instructions  Medication Instructions:   Your physician has recommended you make the following change in your medication:   INCREASE your Torsemide to 20 MG by mouth daily.  *If you need a refill on your cardiac medications before your next appointment, please call your pharmacy*   Lab Work:  Your physician recommends that you return for lab work in: [place]  - Please go to the [facility]. You will check in at the front desk to the right as you walk into the atrium. Valet Parking is offered if needed. - No appointment needed. You may go any day between 7 am and 6 pm.   Testing/Procedures: None ordered   Follow-Up: At [facility], you and your health needs are our priority.  As part of our continuing mission to provide you with exceptional heart care, we have created designated Provider Care Teams.  These  Care Teams include  your primary Cardiologist (physician) and Advanced Practice Providers (APPs -  Physician Assistants and Nurse Practitioners) who all work together to provide you with the care you need, when you need it.  We recommend signing up for the patient portal called "MyChart".  Sign up information is provided on this After Visit Summary.  MyChart is used to connect with patients for Virtual Visits (Telemedicine).  Patients are able to view lab/test results, encounter notes, upcoming appointments, etc.  Non-urgent messages can be sent to your provider as well.   To learn more about what you can do with MyChart, go to [email or web address].    Your next appointment:   3-4 weeks   The format for your next appointment:   In Person  Provider:   Kate Sable, MD   Other Instructions      Signed, Kate Sable, MD  10/22/2020 12:46 PM    [place]

## 2020-10-24 ENCOUNTER — Ambulatory Visit: Payer: Medicare Other | Admitting: Physician Assistant

## 2020-10-24 ENCOUNTER — Other Ambulatory Visit: Payer: Self-pay

## 2020-10-24 VITALS — BP 149/75 | HR 62 | Ht 70.0 in | Wt 326.0 lb

## 2020-10-24 DIAGNOSIS — N400 Enlarged prostate without lower urinary tract symptoms: Secondary | ICD-10-CM

## 2020-10-24 DIAGNOSIS — R3914 Feeling of incomplete bladder emptying: Secondary | ICD-10-CM

## 2020-10-24 DIAGNOSIS — N401 Enlarged prostate with lower urinary tract symptoms: Secondary | ICD-10-CM

## 2020-10-24 LAB — URINALYSIS, COMPLETE
Appearance Ur: NEGATIVE
Bilirubin, UA: NEGATIVE
Glucose, UA: NEGATIVE
Ketones, UA: NEGATIVE
Leukocytes,UA: NEGATIVE
Nitrite, UA: NEGATIVE
Protein,UA: NEGATIVE
RBC, UA: NEGATIVE
Specific Gravity, UA: 1.01 (ref 1.005–1.030)
Urobilinogen, Ur: 0.2 mg/dL (ref 0.2–1.0)
pH, UA: 6 (ref 5.0–7.5)

## 2020-10-24 LAB — MICROSCOPIC EXAMINATION
Bacteria, UA: NONE SEEN
Epithelial Cells (non renal): NONE SEEN /hpf (ref 0–10)
WBC, UA: NONE SEEN /hpf (ref 0–5)

## 2020-10-24 LAB — BLADDER SCAN AMB NON-IMAGING

## 2020-10-24 MED ORDER — FINASTERIDE 5 MG PO TABS: 5.0000 mg | ORAL_TABLET | Freq: Every day | ORAL | 11 refills | Status: AC

## 2020-10-24 NOTE — Progress Notes (Signed)
10/24/2020 11:26 AM   Justin Keens Jr. [birth year] [id number]  CC: Chief Complaint  Patient presents with  . Benign Prostatic Hypertrophy    HPI: Justin Shannon. is a 80 y.o. male with PMH Lewy body dementia, CKD 4, DM 2 managed with diet alone, OSA on CPAP, and BPH with incomplete bladder emptying who presents today to discuss bothersome urinary symptoms.  Today he reports a month-long history of weak stream and straining to void that is acutely worsened in the past week associated with increased torsemide dosing to daily.  Patient lives with his daughter, who is his primary caregiver.  IPSS 31/mostly dissatisfied as below.  He underwent CTAP without contrast on 07/21/2020.  There is a defect in the prostate consistent with past urologic procedure.  Upon questioning, patient's daughter reports he previously saw Dr. Yves Dill and underwent prostate surgery approximately 10 years ago, exact procedure unknown.  He developed a DVT following surgery.  In-office UA and microscopy today pan negative. PVR 332mL.  He subsequently voided 100 mL in clinic.   IPSS    Row Name 10/24/20 1100         International Prostate Symptom Score   How often have you had the sensation of not emptying your bladder? More than half the time     How often have you had to urinate less than every two hours? More than half the time     How often have you found you stopped and started again several times when you urinated? More than half the time     How often have you found it difficult to postpone urination? Almost always     How often have you had a weak urinary stream? Almost always     How often have you had to strain to start urination? Almost always     How many times did you typically get up at night to urinate? 4 Times     Total IPSS Score 31           Quality of Life due to urinary symptoms   If you were to spend the rest of your life with your urinary condition just the way it is now how would you  feel about that? Mostly Disatisfied            PMH: Past Medical History:  Diagnosis Date  . Arthritis   . Bilateral swelling of feet   . BPH (benign prostatic hyperplasia)   . Chronic kidney disease   . DVT (deep venous thrombosis) (HCC)    right lower extremity  . History of gout   . Seizures ([place])   . Sleep apnea   . Tendonitis of foot    left    Surgical History: Past Surgical History:  Procedure Laterality Date  . APPENDECTOMY    . APPLICATION OF WOUND VAC Right 11/02/2019   Procedure: APPLICATION OF WOUND VAC;  Surgeon: Dereck Leep, MD;  Location: [facility] ORS;  Service: Orthopedics;  Laterality: Right;  [id number]  . HERNIA REPAIR     UMBILICAL  . IVC FILTER INSERTION N/A 10/25/2019   Procedure: IVC FILTER INSERTION;  Surgeon: Katha Cabal, MD;  Location: [place] [facility];  Service: Cardiovascular;  Laterality: N/A;  . KNEE ARTHROPLASTY Right 11/02/2019   Procedure: COMPUTER ASSISTED TOTAL KNEE ARTHROPLASTY;  Surgeon: Dereck Leep, MD;  Location: [facility] ORS;  Service: Orthopedics;  Laterality: Right;  . PATELLAR TENDON REPAIR Right 12/05/2019   Procedure: PATELLA TENDON REPAIR;  Surgeon: Dereck Leep, MD;  Location: [facility] ORS;  Service: Orthopedics;  Laterality: Right;  . PROSTATE SURGERY      Home Medications:  Allergies as of 10/24/2020      Reactions   Shellfish Allergy Anaphylaxis, Other (See Comments)   gout   Iodinated Diagnostic Agents Other (See Comments)   Chronic kidney disease 3b   Nsaids Other (See Comments)   Chronic kidney disease 3b   Rivastigmine Other (See Comments)   Side effect and not a true allergy      Medication List       Accurate as of Oct 24, 2020 11:26 AM. If you have any questions, ask your nurse or doctor.        albuterol 108 (90 Base) MCG/ACT inhaler Commonly known as: VENTOLIN HFA Inhale 2 puffs into the lungs every 4 (four) hours as needed for wheezing.   allopurinol 100 MG tablet Commonly known as:  ZYLOPRIM Take 100 mg by mouth daily.   atorvastatin 20 MG tablet Commonly known as: LIPITOR Take 1 tablet (20 mg total) by mouth at bedtime.   docusate sodium 100 MG capsule Commonly known as: COLACE Take 100 mg by mouth daily as needed for mild constipation or moderate constipation.   fexofenadine 180 MG tablet Commonly known as: ALLEGRA Take 180 mg by mouth daily as needed for allergies.   fluconazole 200 MG tablet Commonly known as: DIFLUCAN Take one tab po QD on Monday, Wednesday, and Friday   fluticasone 50 MCG/ACT nasal spray Commonly known as: FLONASE Place 2 sprays into both nostrils daily as needed for allergies.   ketoconazole 2 % cream Commonly known as: NIZORAL Apply to aa's groin QD.   levETIRAcetam 500 MG tablet Commonly known as: KEPPRA Take 500 mg by mouth 2 (two) times daily.   LORazepam 0.5 MG tablet Commonly known as: ATIVAN Take 0.5 mg by mouth every 4 (four) hours as needed.   Magnesium 250 MG Tabs Take 250 mg by mouth daily.   metoprolol succinate 50 MG 24 hr tablet Commonly known as: TOPROL-XL Take 50 mg by mouth daily.   ONE-A-DAY MENS PO Take 1 tablet by mouth daily.   torsemide 20 MG tablet Commonly known as: DEMADEX Take 1 tablet (20 mg total) by mouth daily. Take 1 tab by mouth every Mon/Wed/Fri/Sat (4 days a week)   Xarelto 10 MG Tabs tablet Generic drug: rivaroxaban TAKE ONE TABLET BY MOUTH EVERY DAY WITH BREAKFAST   Xarelto 10 MG Tabs tablet Generic drug: rivaroxaban Take 1 tablet (10 mg total) by mouth daily before breakfast.       Allergies:  Allergies  Allergen Reactions  . Shellfish Allergy Anaphylaxis and Other (See Comments)    gout  . Iodinated Diagnostic Agents Other (See Comments)    Chronic kidney disease 3b  . Nsaids Other (See Comments)    Chronic kidney disease 3b  . Rivastigmine Other (See Comments)    Side effect and not a true allergy    Family History: Family History  Problem Relation Age of  Onset  . Arthritis Mother   . Hypertension Mother   . Diabetes Brother     Social History:   reports that he quit smoking about 52 years ago. His smoking use included cigarettes. He has a 10.00 pack-year smoking history. He has never used smokeless tobacco. He reports that he does not drink alcohol and does not use drugs.  Physical Exam: BP (!) 149/75   Pulse 62  Ht 5\' 10"  (1.778 m)   Wt (!) 326 lb (147.9 kg)   BMI 46.78 kg/m   Constitutional:  Alert and oriented, no acute distress, nontoxic appearing HEENT: [place], AT Cardiovascular: No clubbing, cyanosis, or edema Respiratory: Normal respiratory effort, no increased work of breathing Skin: No rashes, bruises or suspicious lesions Neurologic: Grossly intact, no focal deficits, moving all 4 extremities Psychiatric: Normal mood and affect  Laboratory Data: Results for orders placed or performed in visit on 10/24/20  Microscopic Examination   Urine  Result Value Ref Range   WBC, UA None seen 0 - 5 /hpf   RBC 0-2 0 - 2 /hpf   Epithelial Cells (non renal) None seen 0 - 10 /hpf   Casts Present (A) None seen /lpf   Cast Type Hyaline casts N/A   Bacteria, UA None seen None seen/Few  Urinalysis, Complete  Result Value Ref Range   Specific Gravity, UA 1.010 1.005 - 1.030   pH, UA 6.0 5.0 - 7.5   Color, UA Yellow Yellow   Appearance Ur Negative Clear   Leukocytes,UA Negative Negative   Protein,UA Negative Negative/Trace   Glucose, UA Negative Negative   Ketones, UA Negative Negative   RBC, UA Negative Negative   Bilirubin, UA Negative Negative   Urobilinogen, Ur 0.2 0.2 - 1.0 mg/dL   Nitrite, UA Negative Negative   Microscopic Examination See below:   Bladder Scan (Post Void Residual) in office  Result Value Ref Range   Scan Result 310lb    Assessment & Plan:   1. Benign prostatic hyperplasia with incomplete bladder emptying 80 year old extremely comorbid male with a history of BPH having undergone a remote prostate  procedure presents with worsened voiding symptoms in the setting of increased torsemide.  UA benign today.  I do not recommend using alpha blockers in this patient due to the concern for orthostasis.  Patient is not in overt retention today, no indication for urgent intervention.  I recommended cystoscopy for further evaluation of possible prostatic regrowth versus urethral stricture versus bladder neck contracture.  In the meantime, I recommended starting finasteride and explained that this medication would take several months to strengthen prostate in size with the goal of improving his urinary symptoms.  I explained that alternative therapies would include chronic indwelling Foley catheter versus CIC, however I do not believe these are yet indicated.  Given his comorbidities, I will defer DRE and one-time PSA screening. - Urinalysis, Complete - Bladder Scan (Post Void Residual) in office - finasteride (PROSCAR) 5 MG tablet; Take 1 tablet (5 mg total) by mouth daily.  Dispense: 30 tablet; Refill: 11   Return in about 1 week (around 10/31/2020) for Cystoscopy with Dr. Erlene Quan.  Debroah Loop, PA-C  [facility] [address], [place], [place] [address] [phone]

## 2020-10-24 NOTE — Patient Instructions (Signed)
Cystoscopy Cystoscopy is a procedure that is used to help diagnose and sometimes treat conditions that affect the lower urinary tract. The lower urinary tract includes the bladder and the urethra. The urethra is the tube that drains urine from the bladder. Cystoscopy is done using a thin, tube-shaped instrument with a light and camera at the end (cystoscope). The cystoscope may be hard or flexible, depending on the goal of the procedure. The cystoscope is inserted through the urethra, into the bladder. Cystoscopy may be recommended if you have:  Urinary tract infections that keep coming back.  Blood in the urine (hematuria).  An inability to control when you urinate (urinary incontinence) or an overactive bladder.  Unusual cells found in a urine sample.  A blockage in the urethra, such as a urinary stone.  Painful urination.  An abnormality in the bladder found during an intravenous pyelogram (IVP) or CT scan. Cystoscopy may also be done to remove a sample of tissue to be examined under a microscope (biopsy). What are the risks? Generally, this is a safe procedure. However, problems may occur, including:  Infection.  Bleeding.  What happens during the procedure?  1. You will be given one or more of the following: ? A medicine to numb the area (local anesthetic). 2. The area around the opening of your urethra will be cleaned. 3. The cystoscope will be passed through your urethra into your bladder. 4. Germ-free (sterile) fluid will flow through the cystoscope to fill your bladder. The fluid will stretch your bladder so that your health care provider can clearly examine your bladder walls. 5. Your doctor will look at the urethra and bladder. 6. The cystoscope will be removed The procedure may vary among health care providers  What can I expect after the procedure? After the procedure, it is common to have: 1. Some soreness or pain in your abdomen and urethra. 2. Urinary symptoms.  These include: ? Mild pain or burning when you urinate. Pain should stop within a few minutes after you urinate. This may last for up to 1 week. ? A small amount of blood in your urine for several days. ? Feeling like you need to urinate but producing only a small amount of urine. Follow these instructions at home: General instructions  Return to your normal activities as told by your health care provider.   Do not drive for 24 hours if you were given a sedative during your procedure.  Watch for any blood in your urine. If the amount of blood in your urine increases, call your health care provider.  If a tissue sample was removed for testing (biopsy) during your procedure, it is up to you to get your test results. Ask your health care provider, or the department that is doing the test, when your results will be ready.  Drink enough fluid to keep your urine pale yellow.  Keep all follow-up visits as told by your health care provider. This is important. Contact a health care provider if you:  Have pain that gets worse or does not get better with medicine, especially pain when you urinate.  Have trouble urinating.  Have more blood in your urine. Get help right away if you:  Have blood clots in your urine.  Have abdominal pain.  Have a fever or chills.  Are unable to urinate. Summary  Cystoscopy is a procedure that is used to help diagnose and sometimes treat conditions that affect the lower urinary tract.  Cystoscopy is done using   a thin, tube-shaped instrument with a light and camera at the end.  After the procedure, it is common to have some soreness or pain in your abdomen and urethra.  Watch for any blood in your urine. If the amount of blood in your urine increases, call your health care provider.  If you were prescribed an antibiotic medicine, take it as told by your health care provider. Do not stop taking the antibiotic even if you start to feel better. This  information is not intended to replace advice given to you by your health care provider. Make sure you discuss any questions you have with your health care provider. Document Revised: 06/01/2018 Document Reviewed: 06/01/2018 Elsevier Patient Education  2020 [employer]   

## 2020-10-29 ENCOUNTER — Other Ambulatory Visit
Admission: RE | Admit: 2020-10-29 | Discharge: 2020-10-29 | Disposition: A | Discharge status: Home / Self Care | Attending: Cardiology | Admitting: Cardiology

## 2020-10-29 DIAGNOSIS — I5189 Other ill-defined heart diseases: Secondary | ICD-10-CM | POA: Insufficient documentation

## 2020-10-29 LAB — BASIC METABOLIC PANEL
Anion gap: 6 (ref 5–15)
BUN: 67 mg/dL — ABNORMAL HIGH (ref 8–23)
CO2: 25 mmol/L (ref 22–32)
Calcium: 9.2 mg/dL (ref 8.9–10.3)
Chloride: 109 mmol/L (ref 98–111)
Creatinine, Ser: 2.41 mg/dL — ABNORMAL HIGH (ref 0.61–1.24)
GFR, Estimated: 27 mL/min — ABNORMAL LOW (ref >60)
Glucose, Bld: 102 mg/dL — ABNORMAL HIGH (ref 70–99)
Potassium: 4.8 mmol/L (ref 3.5–5.1)
Sodium: 140 mmol/L (ref 135–145)

## 2020-11-01 ENCOUNTER — Telehealth: Payer: Self-pay | Admitting: *Deleted

## 2020-11-01 DIAGNOSIS — E1122 Type 2 diabetes mellitus with diabetic chronic kidney disease: Secondary | ICD-10-CM

## 2020-11-01 DIAGNOSIS — N184 Chronic kidney disease, stage 4 (severe): Secondary | ICD-10-CM

## 2020-11-01 DIAGNOSIS — N183 Chronic kidney disease, stage 3 unspecified: Secondary | ICD-10-CM

## 2020-11-01 NOTE — Addendum Note (Signed)
Addended by: Valora Corporal on: 11/01/2020 11:10 AM   Modules accepted: Orders

## 2020-11-01 NOTE — Telephone Encounter (Signed)
-----   Message from Kate Sable, MD sent at 10/31/2020  5:41 PM EDT ----- Potassium improved, continue torsemide as prescribed.  Refer patient to nephrology if he has not already established care.  Keep follow-up appointment.  Thank you

## 2020-11-01 NOTE — Telephone Encounter (Signed)
Spoke with patients daughter per release form. Reviewed results and recommendations and she reports patient is already established with [facility] by Dr. Candiss Norse. She verbalized understanding of our conversation with no further questions at this time.

## 2020-11-01 NOTE — Telephone Encounter (Signed)
Left voicemail message to call back for review of results and recommendations.  

## 2020-11-02 ENCOUNTER — Ambulatory Visit: Payer: Medicare Other | Admitting: Cardiology

## 2020-11-14 ENCOUNTER — Encounter: Payer: Medicare Other | Admitting: Urology

## 2020-11-15 ENCOUNTER — Ambulatory Visit: Admitting: Cardiology

## 2020-11-16 ENCOUNTER — Other Ambulatory Visit: Payer: Self-pay

## 2020-11-16 ENCOUNTER — Ambulatory Visit (INDEPENDENT_AMBULATORY_CARE_PROVIDER_SITE_OTHER): Admitting: Cardiology

## 2020-11-16 ENCOUNTER — Encounter: Payer: Self-pay | Admitting: Cardiology

## 2020-11-16 VITALS — BP 108/60 | HR 68 | Ht 70.5 in | Wt 307.0 lb

## 2020-11-16 DIAGNOSIS — R3916 Straining to void: Secondary | ICD-10-CM

## 2020-11-16 DIAGNOSIS — I10 Essential (primary) hypertension: Secondary | ICD-10-CM | POA: Diagnosis not present

## 2020-11-16 DIAGNOSIS — N401 Enlarged prostate with lower urinary tract symptoms: Secondary | ICD-10-CM | POA: Diagnosis not present

## 2020-11-16 DIAGNOSIS — I5189 Other ill-defined heart diseases: Secondary | ICD-10-CM

## 2020-11-16 MED ORDER — METOPROLOL SUCCINATE ER 25 MG PO TB24: 25.0000 mg | ORAL_TABLET | Freq: Every day | ORAL | 6 refills | Status: AC

## 2020-11-16 NOTE — Patient Instructions (Addendum)
Medication Instructions:  - Your physician has recommended you make the following change in your medication:   1) DECREASE toprol (metoprolol succinate) to 25 mg- take 1 tablet by mouth ONCE daily  - you may use up your current supply of metoprolol succinate 50 mg tablets by taking 0.5 tablet (25 mg) once daily until they are gone   *If you need a refill on your cardiac medications before your next appointment, please call your pharmacy*   Lab Work: - none ordered  If you have labs (blood work) drawn today and your tests are completely normal, you will receive your results only by: Marland Kitchen MyChart Message (if you have MyChart) OR . A paper copy in the mail If you have any lab test that is abnormal or we need to change your treatment, we will call you to review the results.   Testing/Procedures: - none ordered   Follow-Up: At [facility], you and your health needs are our priority.  As part of our continuing mission to provide you with exceptional heart care, we have created designated Provider Care Teams.  These Care Teams include your primary Cardiologist (physician) and Advanced Practice Providers (APPs -  Physician Assistants and Nurse Practitioners) who all work together to provide you with the care you need, when you need it.  We recommend signing up for the patient portal called "MyChart".  Sign up information is provided on this After Visit Summary.  MyChart is used to connect with patients for Virtual Visits (Telemedicine).  Patients are able to view lab/test results, encounter notes, upcoming appointments, etc.  Non-urgent messages can be sent to your provider as well.   To learn more about what you can do with MyChart, go to [email or web address].    Your next appointment:   6 week(s)  The format for your next appointment:   In Person  Provider:   Kate Sable, MD (only)   Other Instructions n/a

## 2020-11-16 NOTE — Progress Notes (Signed)
Cardiology Office Note:    Date:  11/16/2020   ID:  [name]., DOB 1941-04-08, MRN [id number]  PCP:  Karamalegos, Alexander J, Broussard  Cardiologist:  Kate Sable, MD  Advanced Practice Provider:  No care team member to display Electrophysiologist:  None       Referring MD: Nobie Putnam *   Chief Complaint  Patient presents with  . Other    3 week follow up. Patient c.o SOB and swelling. Meds reviewed verbally with patient.     History of Present Illness:    Justin Shannon. is a 80 y.o. male with a hx of CKD, HFpEF, BPH, hypertension, dementia, DVT on Xarelto, who presents for follow-up.  Last seen due to edema and HFpEF.  still volume overloaded after last visit, potassium was also elevated.  Torsemide was increased to 20 mg daily.  Repeat BMP 2 weeks ago was obtained.  He now presents for results.  Had issues with straining while urinating, follow-up with urology recommended due to history of BPH, work-up, cystoscopy is being planned.  Lower extremity wraps typically applied at the vein clinic to help with symptoms of edema.  He states taking torsemide as prescribed.    Prior notes Echocardiogram 05/2020 preserved ejection fraction, EF over 40%, grade 2 diastolic dysfunction.    Previously on multiple antihypertensives, low blood pressures were noticed with systolic in the 98J, BP medications were stopped, Toprol-XL only was continued.  Past Medical History:  Diagnosis Date  . Arthritis   . Bilateral swelling of feet   . BPH (benign prostatic hyperplasia)   . Chronic kidney disease   . DVT (deep venous thrombosis) (HCC)    right lower extremity  . History of gout   . Seizures ([place])   . Sleep apnea   . Tendonitis of foot    left    Past Surgical History:  Procedure Laterality Date  . APPENDECTOMY    . APPLICATION OF WOUND VAC Right 11/02/2019   Procedure: APPLICATION OF WOUND VAC;  Surgeon: Dereck Leep, MD;  Location: [facility];  Service: Orthopedics;  Laterality: Right;  [id number]  . HERNIA REPAIR     UMBILICAL  . IVC FILTER INSERTION N/A 10/25/2019   Procedure: IVC FILTER INSERTION;  Surgeon: Katha Cabal, MD;  Location: [facility];  Service: Cardiovascular;  Laterality: N/A;  . KNEE ARTHROPLASTY Right 11/02/2019   Procedure: COMPUTER ASSISTED TOTAL KNEE ARTHROPLASTY;  Surgeon: Dereck Leep, MD;  Location: [facility];  Service: Orthopedics;  Laterality: Right;  . PATELLAR TENDON REPAIR Right 12/05/2019   Procedure: PATELLA TENDON REPAIR;  Surgeon: Dereck Leep, MD;  Location: [facility] OR[facility];  Service: Orthopedics;  Laterality: Right;  . PROSTATE SURGERY      Current Medications: Current Meds  Medication Sig  . albuterol (VENTOLIN HFA) 108 (90 Base) MCG/ACT inhaler Inhale 2 puffs into the lungs every 4 (four) hours as needed for wheezing.  Marland Kitchen allopurinol (ZYLOPRIM) 100 MG tablet Take 100 mg by mouth daily.  Marland Kitchen atorvastatin (LIPITOR) 20 MG tablet Take 1 tablet (20 mg total) by mouth at bedtime.  . docusate sodium (COLACE) 100 MG capsule Take 100 mg by mouth daily as needed for mild constipation or moderate constipation.   . fexofenadine (ALLEGRA) 180 MG tablet Take 180 mg by mouth daily as needed for allergies.   . finasteride (PROSCAR) 5 MG tablet Take 1 tablet (5 mg total) by mouth daily.  Marland Kitchen  fluconazole (DIFLUCAN) 200 MG tablet Take one tab po QD on Monday, Wednesday, and Friday  . fluticasone (FLONASE) 50 MCG/ACT nasal spray Place 2 sprays into both nostrils daily as needed for allergies.   Marland Kitchen ketoconazole (NIZORAL) 2 % cream Apply to aa's groin QD.  Marland Kitchen levETIRAcetam (KEPPRA) 500 MG tablet Take 500 mg by mouth 2 (two) times daily.   Marland Kitchen LORazepam (ATIVAN) 0.5 MG tablet Take 0.5 mg by mouth every 4 (four) hours as needed.  . Magnesium 250 MG TABS Take 250 mg by mouth daily.  . metoprolol succinate (TOPROL-XL) 25 MG 24 hr tablet Take 1 tablet (25 mg total) by mouth daily.  .  Multiple Vitamin (ONE-A-DAY MENS PO) Take 1 tablet by mouth daily.   . rivaroxaban (XARELTO) 10 MG TABS tablet TAKE ONE TABLET BY MOUTH EVERY DAY WITH BREAKFAST  . torsemide (DEMADEX) 20 MG tablet Take 1 tablet (20 mg total) by mouth daily. Take 1 tab by mouth every Mon/Wed/Fri/Sat (4 days a week)  . [DISCONTINUED] metoprolol succinate (TOPROL-XL) 50 MG 24 hr tablet Take 50 mg by mouth daily.      Allergies:   Shellfish allergy, Iodinated diagnostic agents, Nsaids, and Rivastigmine   Social History   Socioeconomic History  . Marital status: Married    Spouse name: Not on file  . Number of children: Not on file  . Years of education: Not on file  . Highest education level: Not on file  Occupational History  . Not on file  Tobacco Use  . Smoking status: Former Smoker    Packs/day: 1.00    Years: 10.00    Pack years: 10.00    Types: Cigarettes    Quit date: 06/23/1968    Years since quitting: 52.4  . Smokeless tobacco: Never Used  Vaping Use  . Vaping Use: Never used  Substance and Sexual Activity  . Alcohol use: No  . Drug use: No  . Sexual activity: Not on file  Other Topics Concern  . Not on file  Social History Narrative  . Not on file   Social Determinants of Health   Financial Resource Strain: Low Risk   . Difficulty of Paying Living Expenses: Not hard at all  Food Insecurity: No Food Insecurity  . Worried About Charity fundraiser in the Last Year: Never true  . Ran Out of Food in the Last Year: Never true  Transportation Needs: No Transportation Needs  . Lack of Transportation (Medical): No  . Lack of Transportation (Non-Medical): No  Physical Activity: Inactive  . Days of Exercise per Week: 0 days  . Minutes of Exercise per Session: 0 min  Stress: No Stress Concern Present  . Feeling of Stress : Only a little  Social Connections: Moderately Isolated  . Frequency of Communication with Friends and Family: More than three times a week  . Frequency of Social  Gatherings with Friends and Family: More than three times a week  . Attends Religious Services: Never  . Active Member of Clubs or Organizations: No  . Attends Archivist Meetings: Not on file  . Marital Status: Married     Family History: The patient's family history includes Arthritis in his mother; Diabetes in his brother; Hypertension in his mother.  ROS:   Please see the history of present illness.     All other systems reviewed and are negative.  EKGs/Labs/Other Studies Reviewed:    The following studies were reviewed today:   EKG:  EKG  not ordered today.    Recent Labs: 07/21/2020: ALT 30 07/22/2020: Hemoglobin 10.1; Platelets 186 10/29/2020: BUN 67; Creatinine, Ser 2.41; Potassium 4.8; Sodium 140  Recent Lipid Panel    Component Value Date/Time   CHOL 96 06/05/2020 0816   TRIG 81 06/05/2020 0816   HDL 57 06/05/2020 0816   CHOLHDL 1.7 06/05/2020 0816   VLDL 16 06/05/2020 0816   LDLCALC 23 06/05/2020 0816     Risk Assessment/Calculations:      Physical Exam:    VS:  BP 108/60 (BP Location: Left Arm, Patient Position: Sitting, Cuff Size: Normal)   Pulse 68   Ht 5' 10.5" (1.791 m)   Wt (!) 307 lb (139.3 kg)   SpO2 97%   BMI 43.43 kg/m     Wt Readings from Last 3 Encounters:  11/16/20 (!) 307 lb (139.3 kg)  10/24/20 (!) 326 lb (147.9 kg)  10/22/20 (!) 322 lb (146.1 kg)     GEN:  Well nourished, well developed in no acute distress HEENT: Normal NECK: No JVD; No carotid bruits LYMPHATICS: No lymphadenopathy CARDIAC: RRR, no murmurs, rubs, gallops RESPIRATORY: Decreased breath sounds at bases ABDOMEN: Soft, non-tender, non-distended MUSCULOSKELETAL: 2+ edema; No deformity  SKIN: Warm and dry NEUROLOGIC:  Alert and oriented x 3 PSYCHIATRIC:  Normal affect   ASSESSMENT:    1. Diastolic dysfunction   2. Primary hypertension   3. Benign prostatic hyperplasia (BPH) with straining on urination    PLAN:    In order of problems listed  above:  1. Grade 2 diastolic dysfunction, edema noted on exam.  20 pound weight loss since last visit.  Repeat BMP with normalization of potassium, creatinine stable.  Continue torsemide to 20 mg daily, plan to check BMP after next visit.  Again, not recommending any additional invasive cardiac testing in this gentleman due to significant comorbidities including decline in function, dementia requiring 24-hour care. 2. Hypertension, BP low normal.  Decrease Toprol-XL to 25 mg daily, consider further decrease versus stopping Toprol-XL at follow-up visit.  3. History of BPH, frequent urination, straining with urination.  Management as per urology.  Cystoscopy, other testing declined.  Follow-up in 6 weeks.     Medication Adjustments/Labs and Tests Ordered: Current medicines are reviewed at length with the patient today.  Concerns regarding medicines are outlined above.  No orders of the defined types were placed in this encounter.  Meds ordered this encounter  Medications  . metoprolol succinate (TOPROL-XL) 25 MG 24 hr tablet    Sig: Take 1 tablet (25 mg total) by mouth daily.    Dispense:  30 tablet    Refill:  6    Dose decrease    Patient Instructions  Medication Instructions:  - Your physician has recommended you make the following change in your medication:   1) DECREASE toprol (metoprolol succinate) to 25 mg- take 1 tablet by mouth ONCE daily  - you may use up your current supply of metoprolol succinate 50 mg tablets by taking 0.5 tablet (25 mg) once daily until they are gone   *If you need a refill on your cardiac medications before your next appointment, please call your pharmacy*   Lab Work: - none ordered  If you have labs (blood work) drawn today and your tests are completely normal, you will receive your results only by: Marland Kitchen MyChart Message (if you have MyChart) OR . A paper copy in the mail If you have any lab test that is abnormal or we need  to change your treatment,  we will call you to review the results.   Testing/Procedures: - none ordered   Follow-Up: At [facility], you and your health needs are our priority.  As part of our continuing mission to provide you with exceptional heart care, we have created designated Provider Care Teams.  These Care Teams include your primary Cardiologist (physician) and Advanced Practice Providers (APPs -  Physician Assistants and Nurse Practitioners) who all work together to provide you with the care you need, when you need it.  We recommend signing up for the patient portal called "MyChart".  Sign up information is provided on this After Visit Summary.  MyChart is used to connect with patients for Virtual Visits (Telemedicine).  Patients are able to view lab/test results, encounter notes, upcoming appointments, etc.  Non-urgent messages can be sent to your provider as well.   To learn more about what you can do with MyChart, go to [email or web address].    Your next appointment:   6 week(s)  The format for your next appointment:   In Person  Provider:   Kate Sable, MD (only)   Other Instructions n/a     Signed, Kate Sable, MD  11/16/2020 12:31 PM    [place]

## 2020-11-18 ENCOUNTER — Emergency Department: Payer: Medicare Other

## 2020-11-18 ENCOUNTER — Inpatient Hospital Stay: Payer: Medicare Other

## 2020-11-18 ENCOUNTER — Inpatient Hospital Stay (HOSPITAL_COMMUNITY)
Admit: 2020-11-18 | Discharge: 2020-11-18 | Disposition: A | Discharge status: Home / Self Care | Payer: Medicare Other | Attending: Pulmonary Disease | Admitting: Pulmonary Disease

## 2020-11-18 ENCOUNTER — Encounter: Payer: Self-pay | Admitting: Pulmonary Disease

## 2020-11-18 ENCOUNTER — Inpatient Hospital Stay
Admission: EM | Admit: 2020-11-18 | Discharge: 2020-12-21 | DRG: 207 | Disposition: E | Payer: Medicare Other | Attending: Internal Medicine | Admitting: Internal Medicine

## 2020-11-18 ENCOUNTER — Other Ambulatory Visit: Payer: Self-pay

## 2020-11-18 DIAGNOSIS — Z79899 Other long term (current) drug therapy: Secondary | ICD-10-CM

## 2020-11-18 DIAGNOSIS — E872 Acidosis: Secondary | ICD-10-CM | POA: Diagnosis present

## 2020-11-18 DIAGNOSIS — D631 Anemia in chronic kidney disease: Secondary | ICD-10-CM | POA: Diagnosis not present

## 2020-11-18 DIAGNOSIS — N400 Enlarged prostate without lower urinary tract symptoms: Secondary | ICD-10-CM | POA: Diagnosis present

## 2020-11-18 DIAGNOSIS — Z87891 Personal history of nicotine dependence: Secondary | ICD-10-CM

## 2020-11-18 DIAGNOSIS — Z7901 Long term (current) use of anticoagulants: Secondary | ICD-10-CM

## 2020-11-18 DIAGNOSIS — J9602 Acute respiratory failure with hypercapnia: Secondary | ICD-10-CM | POA: Diagnosis present

## 2020-11-18 DIAGNOSIS — I130 Hypertensive heart and chronic kidney disease with heart failure and stage 1 through stage 4 chronic kidney disease, or unspecified chronic kidney disease: Secondary | ICD-10-CM | POA: Diagnosis present

## 2020-11-18 DIAGNOSIS — I959 Hypotension, unspecified: Secondary | ICD-10-CM | POA: Diagnosis not present

## 2020-11-18 DIAGNOSIS — G4733 Obstructive sleep apnea (adult) (pediatric): Secondary | ICD-10-CM | POA: Diagnosis present

## 2020-11-18 DIAGNOSIS — N179 Acute kidney failure, unspecified: Secondary | ICD-10-CM | POA: Diagnosis present

## 2020-11-18 DIAGNOSIS — G40909 Epilepsy, unspecified, not intractable, without status epilepticus: Secondary | ICD-10-CM | POA: Diagnosis present

## 2020-11-18 DIAGNOSIS — R404 Transient alteration of awareness: Secondary | ICD-10-CM | POA: Diagnosis not present

## 2020-11-18 DIAGNOSIS — Z886 Allergy status to analgesic agent status: Secondary | ICD-10-CM

## 2020-11-18 DIAGNOSIS — D509 Iron deficiency anemia, unspecified: Secondary | ICD-10-CM | POA: Diagnosis present

## 2020-11-18 DIAGNOSIS — E870 Hyperosmolality and hypernatremia: Secondary | ICD-10-CM | POA: Diagnosis not present

## 2020-11-18 DIAGNOSIS — I5032 Chronic diastolic (congestive) heart failure: Secondary | ICD-10-CM

## 2020-11-18 DIAGNOSIS — Z7189 Other specified counseling: Secondary | ICD-10-CM | POA: Diagnosis not present

## 2020-11-18 DIAGNOSIS — G931 Anoxic brain damage, not elsewhere classified: Secondary | ICD-10-CM | POA: Diagnosis not present

## 2020-11-18 DIAGNOSIS — N184 Chronic kidney disease, stage 4 (severe): Secondary | ICD-10-CM | POA: Diagnosis present

## 2020-11-18 DIAGNOSIS — Z91041 Radiographic dye allergy status: Secondary | ICD-10-CM

## 2020-11-18 DIAGNOSIS — G939 Disorder of brain, unspecified: Secondary | ICD-10-CM | POA: Diagnosis not present

## 2020-11-18 DIAGNOSIS — Z6841 Body Mass Index (BMI) 40.0 and over, adult: Secondary | ICD-10-CM | POA: Diagnosis not present

## 2020-11-18 DIAGNOSIS — I255 Ischemic cardiomyopathy: Secondary | ICD-10-CM | POA: Diagnosis present

## 2020-11-18 DIAGNOSIS — Z8616 Personal history of COVID-19: Secondary | ICD-10-CM

## 2020-11-18 DIAGNOSIS — D649 Anemia, unspecified: Secondary | ICD-10-CM

## 2020-11-18 DIAGNOSIS — I872 Venous insufficiency (chronic) (peripheral): Secondary | ICD-10-CM | POA: Diagnosis present

## 2020-11-18 DIAGNOSIS — I509 Heart failure, unspecified: Secondary | ICD-10-CM | POA: Diagnosis not present

## 2020-11-18 DIAGNOSIS — Z452 Encounter for adjustment and management of vascular access device: Secondary | ICD-10-CM

## 2020-11-18 DIAGNOSIS — B356 Tinea cruris: Secondary | ICD-10-CM | POA: Diagnosis present

## 2020-11-18 DIAGNOSIS — Z515 Encounter for palliative care: Secondary | ICD-10-CM | POA: Diagnosis not present

## 2020-11-18 DIAGNOSIS — J9601 Acute respiratory failure with hypoxia: Principal | ICD-10-CM | POA: Diagnosis present

## 2020-11-18 DIAGNOSIS — I469 Cardiac arrest, cause unspecified: Secondary | ICD-10-CM

## 2020-11-18 DIAGNOSIS — R680 Hypothermia, not associated with low environmental temperature: Secondary | ICD-10-CM | POA: Diagnosis present

## 2020-11-18 DIAGNOSIS — Z743 Need for continuous supervision: Secondary | ICD-10-CM | POA: Diagnosis not present

## 2020-11-18 DIAGNOSIS — E1151 Type 2 diabetes mellitus with diabetic peripheral angiopathy without gangrene: Secondary | ICD-10-CM | POA: Diagnosis present

## 2020-11-18 DIAGNOSIS — I129 Hypertensive chronic kidney disease with stage 1 through stage 4 chronic kidney disease, or unspecified chronic kidney disease: Secondary | ICD-10-CM | POA: Diagnosis not present

## 2020-11-18 DIAGNOSIS — Z86718 Personal history of other venous thrombosis and embolism: Secondary | ICD-10-CM

## 2020-11-18 DIAGNOSIS — Z96651 Presence of right artificial knee joint: Secondary | ICD-10-CM | POA: Diagnosis present

## 2020-11-18 DIAGNOSIS — Z8261 Family history of arthritis: Secondary | ICD-10-CM

## 2020-11-18 DIAGNOSIS — N1832 Chronic kidney disease, stage 3b: Secondary | ICD-10-CM | POA: Diagnosis not present

## 2020-11-18 DIAGNOSIS — Z4659 Encounter for fitting and adjustment of other gastrointestinal appliance and device: Secondary | ICD-10-CM

## 2020-11-18 DIAGNOSIS — M109 Gout, unspecified: Secondary | ICD-10-CM | POA: Diagnosis present

## 2020-11-18 DIAGNOSIS — Z95828 Presence of other vascular implants and grafts: Secondary | ICD-10-CM

## 2020-11-18 DIAGNOSIS — G928 Other toxic encephalopathy: Secondary | ICD-10-CM | POA: Diagnosis present

## 2020-11-18 DIAGNOSIS — Z91013 Allergy to seafood: Secondary | ICD-10-CM

## 2020-11-18 DIAGNOSIS — G20 Parkinson's disease: Secondary | ICD-10-CM | POA: Diagnosis present

## 2020-11-18 DIAGNOSIS — Z833 Family history of diabetes mellitus: Secondary | ICD-10-CM

## 2020-11-18 DIAGNOSIS — E1122 Type 2 diabetes mellitus with diabetic chronic kidney disease: Secondary | ICD-10-CM | POA: Diagnosis present

## 2020-11-18 DIAGNOSIS — I468 Cardiac arrest due to other underlying condition: Secondary | ICD-10-CM | POA: Diagnosis present

## 2020-11-18 DIAGNOSIS — Z888 Allergy status to other drugs, medicaments and biological substances status: Secondary | ICD-10-CM

## 2020-11-18 DIAGNOSIS — F0280 Dementia in other diseases classified elsewhere without behavioral disturbance: Secondary | ICD-10-CM | POA: Diagnosis present

## 2020-11-18 DIAGNOSIS — Z66 Do not resuscitate: Secondary | ICD-10-CM | POA: Diagnosis not present

## 2020-11-18 DIAGNOSIS — R000 Tachycardia, unspecified: Secondary | ICD-10-CM | POA: Diagnosis not present

## 2020-11-18 DIAGNOSIS — J189 Pneumonia, unspecified organism: Secondary | ICD-10-CM | POA: Diagnosis not present

## 2020-11-18 DIAGNOSIS — Z8249 Family history of ischemic heart disease and other diseases of the circulatory system: Secondary | ICD-10-CM

## 2020-11-18 DIAGNOSIS — I499 Cardiac arrhythmia, unspecified: Secondary | ICD-10-CM | POA: Diagnosis not present

## 2020-11-18 DIAGNOSIS — R569 Unspecified convulsions: Secondary | ICD-10-CM

## 2020-11-18 DIAGNOSIS — R0689 Other abnormalities of breathing: Secondary | ICD-10-CM | POA: Diagnosis not present

## 2020-11-18 DIAGNOSIS — R092 Respiratory arrest: Secondary | ICD-10-CM | POA: Diagnosis not present

## 2020-11-18 DIAGNOSIS — R9389 Abnormal findings on diagnostic imaging of other specified body structures: Secondary | ICD-10-CM

## 2020-11-18 HISTORY — DX: Chronic kidney disease, stage 3 unspecified: N18.30

## 2020-11-18 HISTORY — DX: Unspecified dementia, unspecified severity, without behavioral disturbance, psychotic disturbance, mood disturbance, and anxiety: F03.90

## 2020-11-18 HISTORY — DX: Essential (primary) hypertension: I10

## 2020-11-18 HISTORY — DX: Unspecified diastolic (congestive) heart failure: I50.30

## 2020-11-18 HISTORY — DX: Gout, unspecified: M10.9

## 2020-11-18 LAB — CBC WITH DIFFERENTIAL/PLATELET
Abs Immature Granulocytes: 0.18 10*3/uL — ABNORMAL HIGH (ref 0.00–0.07)
Basophils Absolute: 0 10*3/uL (ref 0.0–0.1)
Basophils Relative: 0 %
Eosinophils Absolute: 0 10*3/uL (ref 0.0–0.5)
Eosinophils Relative: 0 %
HCT: 7.9 % — CL (ref 39.0–52.0)
Hemoglobin: 2.5 g/dL — CL (ref 13.0–17.0)
Immature Granulocytes: 1 %
Lymphocytes Relative: 7 %
Lymphs Abs: 0.9 10*3/uL (ref 0.7–4.0)
MCH: 21.6 pg — ABNORMAL LOW (ref 26.0–34.0)
MCHC: 31.6 g/dL (ref 30.0–36.0)
MCV: 68.1 fL — ABNORMAL LOW (ref 80.0–100.0)
Monocytes Absolute: 0.4 10*3/uL (ref 0.1–1.0)
Monocytes Relative: 3 %
Neutro Abs: 12 10*3/uL — ABNORMAL HIGH (ref 1.7–7.7)
Neutrophils Relative %: 89 %
Platelets: 151 10*3/uL (ref 150–400)
RBC: 1.16 MIL/uL — ABNORMAL LOW (ref 4.22–5.81)
RDW: 20.3 % — ABNORMAL HIGH (ref 11.5–15.5)
Smear Review: NORMAL
WBC: 13.5 10*3/uL — ABNORMAL HIGH (ref 4.0–10.5)
nRBC: 4.2 % — ABNORMAL HIGH (ref 0.0–0.2)

## 2020-11-18 LAB — BLOOD GAS, ARTERIAL
Acid-base deficit: 8.9 mmol/L — ABNORMAL HIGH (ref 0.0–2.0)
Allens test (pass/fail): POSITIVE — AB
Bicarbonate: 18.4 mmol/L — ABNORMAL LOW (ref 20.0–28.0)
FIO2: 100
MECHVT: 500 mL
Mechanical Rate: 16
O2 Saturation: 99.9 %
PEEP: 5 cmH2O
Patient temperature: 37
RATE: 16 resp/min
pCO2 arterial: 44 mmHg (ref 32.0–48.0)
pH, Arterial: 7.23 — ABNORMAL LOW (ref 7.350–7.450)
pO2, Arterial: 381 mmHg — ABNORMAL HIGH (ref 83.0–108.0)

## 2020-11-18 LAB — ECHOCARDIOGRAM COMPLETE
AV Mean grad: 7 mmHg
AV Peak grad: 14.7 mmHg
Ao pk vel: 1.92 m/s
Area-P 1/2: 3.68 cm2
Height: 70.5 in
S' Lateral: 2.6 cm
Weight: 5037.07 oz

## 2020-11-18 LAB — HEMOGLOBIN AND HEMATOCRIT, BLOOD
HCT: 14.1 % — CL (ref 39.0–52.0)
Hemoglobin: 4.5 g/dL — CL (ref 13.0–17.0)

## 2020-11-18 LAB — BASIC METABOLIC PANEL
Anion gap: 10 (ref 5–15)
BUN: 106 mg/dL — ABNORMAL HIGH (ref 8–23)
CO2: 22 mmol/L (ref 22–32)
Calcium: 8.3 mg/dL — ABNORMAL LOW (ref 8.9–10.3)
Chloride: 112 mmol/L — ABNORMAL HIGH (ref 98–111)
Creatinine, Ser: 3.42 mg/dL — ABNORMAL HIGH (ref 0.61–1.24)
GFR, Estimated: 18 mL/min — ABNORMAL LOW (ref >60)
Glucose, Bld: 144 mg/dL — ABNORMAL HIGH (ref 70–99)
Potassium: 5.6 mmol/L — ABNORMAL HIGH (ref 3.5–5.1)
Sodium: 144 mmol/L (ref 135–145)

## 2020-11-18 LAB — COMPREHENSIVE METABOLIC PANEL WITH GFR
ALT: 159 U/L — ABNORMAL HIGH (ref 0–44)
AST: 179 U/L — ABNORMAL HIGH (ref 15–41)
Albumin: 2.8 g/dL — ABNORMAL LOW (ref 3.5–5.0)
Alkaline Phosphatase: 108 U/L (ref 38–126)
Anion gap: 14 (ref 5–15)
BUN: 99 mg/dL — ABNORMAL HIGH (ref 8–23)
CO2: 19 mmol/L — ABNORMAL LOW (ref 22–32)
Calcium: 9 mg/dL (ref 8.9–10.3)
Chloride: 111 mmol/L (ref 98–111)
Creatinine, Ser: 3.18 mg/dL — ABNORMAL HIGH (ref 0.61–1.24)
GFR, Estimated: 19 mL/min — ABNORMAL LOW
Glucose, Bld: 175 mg/dL — ABNORMAL HIGH (ref 70–99)
Potassium: 4.8 mmol/L (ref 3.5–5.1)
Sodium: 144 mmol/L (ref 135–145)
Total Bilirubin: 0.5 mg/dL (ref 0.3–1.2)
Total Protein: 5.6 g/dL — ABNORMAL LOW (ref 6.5–8.1)

## 2020-11-18 LAB — GLUCOSE, CAPILLARY
Glucose-Capillary: 129 mg/dL — ABNORMAL HIGH (ref 70–99)
Glucose-Capillary: 138 mg/dL — ABNORMAL HIGH (ref 70–99)
Glucose-Capillary: 141 mg/dL — ABNORMAL HIGH (ref 70–99)

## 2020-11-18 LAB — APTT: aPTT: 42 seconds — ABNORMAL HIGH (ref 24–36)

## 2020-11-18 LAB — CBC
HCT: 8 % — CL (ref 39.0–52.0)
Hemoglobin: 2.5 g/dL — CL (ref 13.0–17.0)
MCH: 21 pg — ABNORMAL LOW (ref 26.0–34.0)
MCHC: 31.3 g/dL (ref 30.0–36.0)
MCV: 67.2 fL — ABNORMAL LOW (ref 80.0–100.0)
Platelets: 153 10*3/uL (ref 150–400)
RBC: 1.19 MIL/uL — ABNORMAL LOW (ref 4.22–5.81)
RDW: 20.2 % — ABNORMAL HIGH (ref 11.5–15.5)
WBC: 12.7 10*3/uL — ABNORMAL HIGH (ref 4.0–10.5)
nRBC: 5 % — ABNORMAL HIGH (ref 0.0–0.2)

## 2020-11-18 LAB — RESP PANEL BY RT-PCR (FLU A&B, COVID) ARPGX2
Influenza A by PCR: NEGATIVE
Influenza B by PCR: NEGATIVE
SARS Coronavirus 2 by RT PCR: NEGATIVE

## 2020-11-18 LAB — PHOSPHORUS: Phosphorus: 5.4 mg/dL — ABNORMAL HIGH (ref 2.5–4.6)

## 2020-11-18 LAB — TROPONIN I (HIGH SENSITIVITY)
Troponin I (High Sensitivity): 30 ng/L — ABNORMAL HIGH (ref <18)
Troponin I (High Sensitivity): 119 ng/L (ref <18)

## 2020-11-18 LAB — MRSA PCR SCREENING: MRSA by PCR: NEGATIVE

## 2020-11-18 LAB — LACTIC ACID, PLASMA
Lactic Acid, Venous: 6.6 mmol/L (ref 0.5–1.9)
Lactic Acid, Venous: 5.1 mmol/L (ref 0.5–1.9)

## 2020-11-18 LAB — STREP PNEUMONIAE URINARY ANTIGEN: Strep Pneumo Urinary Antigen: NEGATIVE

## 2020-11-18 LAB — PROTIME-INR
INR: 2.4 — ABNORMAL HIGH (ref 0.8–1.2)
Prothrombin Time: 26.1 seconds — ABNORMAL HIGH (ref 11.4–15.2)

## 2020-11-18 LAB — PREPARE RBC (CROSSMATCH)

## 2020-11-18 LAB — MAGNESIUM
Magnesium: 2.6 mg/dL — ABNORMAL HIGH (ref 1.7–2.4)
Magnesium: 2.2 mg/dL (ref 1.7–2.4)

## 2020-11-18 LAB — DAT, POLYSPECIFIC AHG (ARMC ONLY): Polyspecific AHG test: NEGATIVE

## 2020-11-18 MED ORDER — LEVETIRACETAM IN NACL 1000 MG/100ML IV SOLN
1000.0000 mg | Freq: Once | INTRAVENOUS | Status: AC
  Administered 2020-11-18: 1000 mg via INTRAVENOUS
  Filled 2020-11-18: qty 100

## 2020-11-18 MED ORDER — PANTOPRAZOLE SODIUM 40 MG IV SOLR
40.0000 mg | Freq: Every day | INTRAVENOUS | Status: DC
  Administered 2020-11-18 – 2020-11-21 (×4): 40 mg via INTRAVENOUS
  Filled 2020-11-18 (×4): qty 40

## 2020-11-18 MED ORDER — DEXMEDETOMIDINE HCL IN NACL 400 MCG/100ML IV SOLN
0.0000 ug/kg/h | INTRAVENOUS | Status: DC
  Administered 2020-11-18 (×2): 0.4 ug/kg/h via INTRAVENOUS

## 2020-11-18 MED ORDER — DOCUSATE SODIUM 100 MG PO CAPS: 100.0000 mg | ORAL_CAPSULE | Freq: Two times a day (BID) | ORAL | Status: DC | PRN

## 2020-11-18 MED ORDER — MIDAZOLAM BOLUS VIA INFUSION
2.0000 mg | INTRAVENOUS | Status: DC | PRN
  Filled 2020-11-18: qty 4

## 2020-11-18 MED ORDER — MIDAZOLAM HCL 2 MG/2ML IJ SOLN: 1.0000 mg | INTRAMUSCULAR | Status: DC | PRN

## 2020-11-18 MED ORDER — ROCURONIUM BROMIDE 50 MG/5ML IV SOLN
INTRAVENOUS | Status: AC | PRN
  Administered 2020-11-18: 100 mg via INTRAVENOUS

## 2020-11-18 MED ORDER — VANCOMYCIN HCL 2000 MG/400ML IV SOLN
2000.0000 mg | Freq: Once | INTRAVENOUS | Status: AC
  Administered 2020-11-18: 2000 mg via INTRAVENOUS
  Filled 2020-11-18: qty 400

## 2020-11-18 MED ORDER — MIDAZOLAM 50MG/50ML (1MG/ML) PREMIX INFUSION
0.5000 mg/h | INTRAVENOUS | Status: DC
  Administered 2020-11-18: 2 mg/h via INTRAVENOUS
  Filled 2020-11-18 (×2): qty 50

## 2020-11-18 MED ORDER — POLYETHYLENE GLYCOL 3350 17 G PO PACK: 17.0000 g | PACK | Freq: Every day | ORAL | Status: DC | PRN

## 2020-11-18 MED ORDER — VANCOMYCIN VARIABLE DOSE PER UNSTABLE RENAL FUNCTION (PHARMACIST DOSING): Status: DC

## 2020-11-18 MED ORDER — PIPERACILLIN-TAZOBACTAM 3.375 G IVPB 30 MIN
3.3750 g | Freq: Once | INTRAVENOUS | Status: AC
  Administered 2020-11-18: 3.375 g via INTRAVENOUS
  Filled 2020-11-18: qty 50

## 2020-11-18 MED ORDER — NOREPINEPHRINE 4 MG/250ML-% IV SOLN
0.0000 ug/min | INTRAVENOUS | Status: DC
  Administered 2020-11-18: 50 ug/min via INTRAVENOUS
  Administered 2020-11-19: 7 ug/min via INTRAVENOUS
  Administered 2020-11-19: 8 ug/min via INTRAVENOUS
  Administered 2020-11-20: 7 ug/min via INTRAVENOUS
  Filled 2020-11-18 (×4): qty 250

## 2020-11-18 MED ORDER — SODIUM CHLORIDE 0.9 % IV SOLN: INTRAVENOUS | Status: DC

## 2020-11-18 MED ORDER — FENTANYL 2500MCG IN NS 250ML (10MCG/ML) PREMIX INFUSION
100.0000 ug/h | INTRAVENOUS | Status: DC
  Administered 2020-11-18 – 2020-11-20 (×3): 150 ug/h via INTRAVENOUS
  Administered 2020-11-20: 125 ug/h via INTRAVENOUS
  Filled 2020-11-18 (×4): qty 250

## 2020-11-18 MED ORDER — PERFLUTREN LIPID MICROSPHERE
1.0000 mL | INTRAVENOUS | Status: AC | PRN
Start: 2020-11-18 — End: 2020-11-18
  Administered 2020-11-18: 4.5 mL via INTRAVENOUS
  Filled 2020-11-18: qty 10

## 2020-11-18 MED ORDER — ORAL CARE MOUTH RINSE
15.0000 mL | OROMUCOSAL | Status: DC
  Administered 2020-11-18 – 2020-11-22 (×39): 15 mL via OROMUCOSAL

## 2020-11-18 MED ORDER — ETOMIDATE 2 MG/ML IV SOLN
INTRAVENOUS | Status: AC | PRN
  Administered 2020-11-18: 20 mg via INTRAVENOUS

## 2020-11-18 MED ORDER — LEVETIRACETAM IN NACL 500 MG/100ML IV SOLN
500.0000 mg | Freq: Two times a day (BID) | INTRAVENOUS | Status: DC
  Administered 2020-11-19 – 2020-11-22 (×8): 500 mg via INTRAVENOUS
  Filled 2020-11-18 (×9): qty 100

## 2020-11-18 MED ORDER — FUROSEMIDE 10 MG/ML IJ SOLN
20.0000 mg | Freq: Once | INTRAMUSCULAR | Status: AC
  Administered 2020-11-18: 20 mg via INTRAVENOUS
  Filled 2020-11-18: qty 2

## 2020-11-18 MED ORDER — PIPERACILLIN-TAZOBACTAM 3.375 G IVPB
3.3750 g | Freq: Three times a day (TID) | INTRAVENOUS | Status: DC
  Administered 2020-11-18 – 2020-11-19 (×2): 3.375 g via INTRAVENOUS
  Filled 2020-11-18 (×2): qty 50

## 2020-11-18 MED ORDER — DEXMEDETOMIDINE HCL IN NACL 400 MCG/100ML IV SOLN
0.4000 ug/kg/h | INTRAVENOUS | Status: DC
Start: 2020-11-18 — End: 2020-11-18

## 2020-11-18 MED ORDER — SODIUM ZIRCONIUM CYCLOSILICATE 5 G PO PACK
10.0000 g | PACK | Freq: Three times a day (TID) | ORAL | Status: DC
  Administered 2020-11-19 (×2): 10 g via ORAL
  Filled 2020-11-18 (×3): qty 2

## 2020-11-18 MED ORDER — FENTANYL CITRATE (PF) 100 MCG/2ML IJ SOLN: 50.0000 ug | INTRAMUSCULAR | Status: DC | PRN

## 2020-11-18 MED ORDER — CHLORHEXIDINE GLUCONATE 0.12% ORAL RINSE (MEDLINE KIT)
15.0000 mL | Freq: Two times a day (BID) | OROMUCOSAL | Status: DC
  Administered 2020-11-18 – 2020-11-22 (×9): 15 mL via OROMUCOSAL

## 2020-11-18 MED ORDER — SODIUM CHLORIDE 0.9% IV SOLUTION
Freq: Once | INTRAVENOUS | Status: AC
  Filled 2020-11-18: qty 250

## 2020-11-18 MED ORDER — CHLORHEXIDINE GLUCONATE CLOTH 2 % EX PADS
6.0000 | MEDICATED_PAD | Freq: Every day | CUTANEOUS | Status: DC
  Administered 2020-11-19 – 2020-11-22 (×4): 6 via TOPICAL

## 2020-11-18 MED ORDER — SODIUM ZIRCONIUM CYCLOSILICATE 5 G PO PACK: 10.0000 g | PACK | Freq: Four times a day (QID) | ORAL | Status: DC

## 2020-11-18 MED ORDER — SODIUM CHLORIDE 0.9 % IV SOLN
500.0000 mg | Freq: Once | INTRAVENOUS | Status: DC
  Filled 2020-11-18: qty 500

## 2020-11-18 MED ORDER — FENTANYL BOLUS VIA INFUSION
50.0000 ug | INTRAVENOUS | Status: DC | PRN
  Administered 2020-11-19: 50 ug via INTRAVENOUS
  Filled 2020-11-18: qty 100

## 2020-11-18 NOTE — Procedures (Signed)
Central Venous Catheter Placement:  TRIPLE LUMEN Indication: Patient receiving vesicant or irritant drug.; Patient receiving intravenous therapy for longer than 5 days.; Patient has limited or no vascular access.   Consent:from POA Latisha Elsass    Hand washing performed prior to starting the procedure.   Procedure:   An active timeout was performed and correct patient, name, & ID confirmed.   Patient was positioned correctly for central venous access.  Patient was prepped using strict sterile technique including chlorohexadine preps, sterile drape, sterile gown and sterile gloves.    The area was prepped, draped and anesthetized in the usual sterile manner. Patient comfort was obtained.    A triple lumen catheter was placed in Right Internal Jugular Vein There was good blood return, catheter caps were placed on lumens, catheter flushed easily, the line was secured and a sterile dressing and BIO-PATCH applied.   Ultrasound was used to visualize vasculature and guidance of needle.   Number of Attempts: 1 Complications:none Estimated Blood Loss: none Chest Radiograph indicated and ordered.      Ottie Glazier, M.D.  [facility] [place]

## 2020-11-18 NOTE — Progress Notes (Signed)
*  PRELIMINARY RESULTS* Echocardiogram 2D Echocardiogram has been performed. Definity IV Contrast used on this study. Claretta Fraise 11/06/2020, 4:20 PM

## 2020-11-18 NOTE — H&P (Addendum)
CRITICAL CARE PROGRESS NOTE    Name: [facility]. MRN: [id number] DOB: 1941/04/02     LOS: 0   SUBJECTIVE FINDINGS & SIGNIFICANT EVENTS    Patient description:  This is 80 year old male with a history of CKD 4, chronic venous insufficiency, DVT on Xarelto, peripheral artery disease, CHF seen by [facility] last transthoracic echo done December 2021, morbid obesity, obstructive sleep apnea on CPAP device at home, type 2 diabetes, Lewy body dementia with parkinsonism, chronic anemia history of total knee replacement, seizure disorder came in status post cardiac arrest.  Received ACLS 20 minutes with ROSC.  Lost pulse again and required ACLS second time followed by ROSC required endotracheal intubation in the ER.  Medical intensive care unit admission post cardiac arrest.    Evaluated patient with nephrologist together at the bedside.  Family was present during my evaluation.  They deny flulike illness malaise, myalgia he did previously have COVID-19 and recovered to baseline.  Additional review of systems reveals decreased appetite over 48 hours.  Denies diarrhea nausea vomiting or any additional symptoms.  Labs show chronic anemia, CKD metabolic acidosis with high anion gap.  Vitals without hypotension or shock.  On exam GCS 4 T with plan to perform CT head to rule out intracerebral hemorrhage followed by [place] [employer] cooling protocol post cardiac arrest.    Lines/tubes : Airway 8 mm (Active)  Secured at (cm) 25 cm 11/17/2020 1207  Measured From Lips 11/05/2020 [place] 11/02/2020 1207  Secured By [employer] 10/30/2020 1207  Cuff Pressure (cm H2O) Green OR 18-26 [facility] 10/29/2020 1207  Site Condition Cool;Dry 11/13/2020 1207     Negative Pressure Wound Therapy Knee Right;Anterior (Active)      NG/OG Tube Orogastric 18 Fr. Xray;Confirmed by Surgical Manipulation 60 cm (Active)  Cm Marking at Nare/Corner of Mouth (if applicable) 60 cm 36/46/80 1211     Urethral Catheter Melissa, SN Temperature probe 16 Fr. (Active)  Indication for Insertion or Continuance of Catheter Unstable critically ill patients first 24-48 hours (See Criteria) 10/21/2020 1225  Site Assessment Clean;Intact;Dry 11/04/2020 1225  Catheter Maintenance Bag below level of bladder;Catheter secured;Drainage bag/tubing not touching floor;Insertion date on drainage bag 10/25/2020 1225  Collection Container Standard drainage bag 10/23/2020 1225  Securement Method Tape 11/17/2020 1225    Microbiology/Sepsis markers: Results for orders placed or performed during the hospital encounter of 11/20/2020  Resp Panel by RT-PCR (Flu A&B, Covid) Nasopharyngeal Swab     Status: None   Collection Time: 11/13/2020 12:07 PM   Specimen: Nasopharyngeal Swab; Nasopharyngeal(NP) swabs in vial transport medium  Result Value Ref Range Status   SARS Coronavirus 2 by RT PCR NEGATIVE NEGATIVE Final    Comment: (NOTE) SARS-CoV-2 target nucleic acids are NOT DETECTED.  The SARS-CoV-2 RNA is generally detectable in upper respiratory specimens during the acute phase of infection. The lowest concentration of SARS-CoV-2 viral copies this assay can detect is 138 copies/mL. A negative result does not preclude SARS-Cov-2 infection and should not be used as the sole basis for treatment or other patient management decisions. A negative result may occur with  improper specimen collection/handling, submission of specimen other than nasopharyngeal swab, presence of viral mutation(s) within the areas targeted by this assay, and inadequate number of viral copies(<138 copies/mL). A negative result must be combined with clinical observations, patient history, and epidemiological information. The expected result is Negative.  Fact Sheet for Patients:   EntrepreneurPulse.com.au  Fact Sheet for Healthcare Providers:  IncredibleEmployment.be  This test is no t yet approved or cleared by the [place] and  has been authorized for detection and/or diagnosis of SARS-CoV-2 by FDA under an Emergency Use Authorization (EUA). This EUA will remain  in effect (meaning this test can be used) for the duration of the COVID-19 declaration under Section 564(b)(1) of the Act, 21 U.S.C.section 360bbb-3(b)(1), unless the authorization is terminated  or revoked sooner.       Influenza A by PCR NEGATIVE NEGATIVE Final   Influenza B by PCR NEGATIVE NEGATIVE Final    Comment: (NOTE) The Xpert Xpress SARS-CoV-2/FLU/RSV plus assay is intended as an aid in the diagnosis of influenza from Nasopharyngeal swab specimens and should not be used as a sole basis for treatment. Nasal washings and aspirates are unacceptable for Xpert Xpress SARS-CoV-2/FLU/RSV testing.  Fact Sheet for Patients: EntrepreneurPulse.com.au  Fact Sheet for Healthcare Providers: IncredibleEmployment.be  This test is not yet approved or cleared by the [place] FDA and has been authorized for detection and/or diagnosis of SARS-CoV-2 by FDA under an Emergency Use Authorization (EUA). This EUA will remain in effect (meaning this test can be used) for the duration of the COVID-19 declaration under Section 564(b)(1) of the Act, 21 U.S.C. section 360bbb-3(b)(1), unless the authorization is terminated or revoked.  Performed at [facility], [address], [place],  [address]     Anti-infectives:  Anti-infectives (From admission, onward)   Start     Dose/Rate Route Frequency Ordered Stop   11/16/2020 2200  piperacillin-tazobactam (ZOSYN) IVPB 3.375 g        3.375 g 12.5 mL/hr over 240 Minutes Intravenous Every 8 hours 11/04/2020 1424     11/16/2020 1430  vancomycin (VANCOREADY) IVPB 2000  mg/400 mL        2,000 mg 200 mL/hr over 120 Minutes Intravenous  Once 11/20/2020 1315     10/21/2020 1424  vancomycin variable dose per unstable renal function (pharmacist dosing)         Does not apply See admin instructions 10/28/2020 1424     10/24/2020 1300  piperacillin-tazobactam (ZOSYN) IVPB 3.375 g        3.375 g 100 mL/hr over 30 Minutes Intravenous  Once 11/20/2020 1248 11/05/2020 1328   10/25/2020 1300  azithromycin (ZITHROMAX) 500 mg in sodium chloride 0.9 % 250 mL IVPB  Status:  Discontinued        500 mg 250 mL/hr over 60 Minutes Intravenous  Once 11/10/2020 1248 10/30/2020 1423        PAST MEDICAL HISTORY   Past Medical History:  Diagnosis Date  . (HFpEF) heart failure with preserved ejection fraction ([place])    a. 05/2020 Echo: EF >55%, Gr2 DD. Mildly enlarged RV. Mild LAE. Mild MR.  . Arthritis   . Bilateral swelling of feet   . BPH (benign prostatic hyperplasia)   . CKD (chronic kidney disease), stage III ([place])   . COVID-19 virus infection 06/2019  . Dementia ([place])    a. requires 24 hr care  . Essential hypertension   . Gout   . Left Foot Tendonitis   . Right Lower Extremity DVT (deep venous thrombosis) ([place])    a. On Xarelto; s/p IVC filter.  . Seizures ([place])    a. on keppra  . Sleep apnea      SURGICAL HISTORY   Past Surgical History:  Procedure Laterality Date  . APPENDECTOMY    . APPLICATION OF WOUND VAC Right 11/02/2019   Procedure: APPLICATION  OF WOUND VAC;  Surgeon: Dereck Leep, MD;  Location: [facility] ORS;  Service: Orthopedics;  Laterality: Right;  [id number]  . HERNIA REPAIR     UMBILICAL  . IVC FILTER INSERTION N/A 10/25/2019   Procedure: IVC FILTER INSERTION;  Surgeon: Katha Cabal, MD;  Location: [facility];  Service: Cardiovascular;  Laterality: N/A;  . KNEE ARTHROPLASTY Right 11/02/2019   Procedure: COMPUTER ASSISTED TOTAL KNEE ARTHROPLASTY;  Surgeon: Dereck Leep, MD;  Location: [facility] ORS;  Service: Orthopedics;  Laterality: Right;  .  PATELLAR TENDON REPAIR Right 12/05/2019   Procedure: PATELLA TENDON REPAIR;  Surgeon: Dereck Leep, MD;  Location: [facility] ORS;  Service: Orthopedics;  Laterality: Right;  . PROSTATE SURGERY       FAMILY HISTORY   Family History  Problem Relation Age of Onset  . Arthritis Mother   . Hypertension Mother   . Diabetes Brother      SOCIAL HISTORY   Social History   Tobacco Use  . Smoking status: Former Smoker    Packs/day: 1.00    Years: 10.00    Pack years: 10.00    Types: Cigarettes    Quit date: 06/23/1968    Years since quitting: 52.4  . Smokeless tobacco: Never Used  Vaping Use  . Vaping Use: Never used  Substance Use Topics  . Alcohol use: No  . Drug use: No     MEDICATIONS   Current Medication:  Current Facility-Administered Medications:  .  0.9 %  sodium chloride infusion (Manually program via Guardrails IV Fluids), , Intravenous, Once, Lanney Gins, Darrielle Pflieger, MD .  chlorhexidine gluconate (MEDLINE KIT) (PERIDEX) 0.12 % solution 15 mL, 15 mL, Mouth Rinse, BID, Lanney Gins, Jeydan Barner, MD .  Derrill Memo ON 11/19/2020] Chlorhexidine Gluconate Cloth 2 % PADS 6 each, 6 each, Topical, Q0600, Luxe Cuadros, MD .  dexmedetomidine (PRECEDEX) 400 MCG/100ML (4 mcg/mL) infusion, 0-1.2 mcg/kg/hr, Intravenous, Continuous, Blake Divine, MD, Last Rate: 13.93 mL/hr at 10/27/2020 1407, 0.4 mcg/kg/hr at 11/14/2020 1407 .  docusate sodium (COLACE) capsule 100 mg, 100 mg, Oral, BID PRN, Ottie Glazier, MD .  fentaNYL (SUBLIMAZE) injection 50 mcg, 50 mcg, Intravenous, Q15 min PRN, Blake Divine, MD .  fentaNYL (SUBLIMAZE) injection 50 mcg, 50 mcg, Intravenous, Q2H PRN, Blake Divine, MD .  levETIRAcetam (KEPPRA) IVPB 1000 mg/100 mL premix, 1,000 mg, Intravenous, Once **FOLLOWED BY** [START ON 11/19/2020] levETIRAcetam (KEPPRA) IVPB 500 mg/100 mL premix, 500 mg, Intravenous, Q12H, Derek Jack, MD .  MEDLINE mouth rinse, 15 mL, Mouth Rinse, 10 times per day, Ottie Glazier, MD .  midazolam  (VERSED) 50 mg/50 mL (1 mg/mL) premix infusion, 0.5-10 mg/hr, Intravenous, Continuous, Trinda Harlacher, MD .  midazolam (VERSED) injection 1 mg, 1 mg, Intravenous, Q15 min PRN, Blake Divine, MD .  midazolam (VERSED) injection 1 mg, 1 mg, Intravenous, Q2H PRN, Blake Divine, MD .  piperacillin-tazobactam (ZOSYN) IVPB 3.375 g, 3.375 g, Intravenous, Q8H, Vallery Sa D, RPH .  polyethylene glycol (MIRALAX / GLYCOLAX) packet 17 g, 17 g, Oral, Daily PRN, Ottie Glazier, MD .  vancomycin (VANCOREADY) IVPB 2000 mg/400 mL, 2,000 mg, Intravenous, Once, Dallie Piles, RPH, Last Rate: 200 mL/hr at 11/16/2020 1631, 2,000 mg at 11/09/2020 1631 .  vancomycin variable dose per unstable renal function (pharmacist dosing), , Does not apply, See admin instructions, Dallie Piles, RPH  Facility-Administered Medications Ordered in Other Encounters:  .  perflutren lipid microspheres (DEFINITY) IV suspension, 1-10 mL, Intravenous, PRN, Ottie Glazier, MD, 4.5 mL at 11/02/2020 1618  ALLERGIES   Shellfish allergy, Iodinated diagnostic agents, Nsaids, and Rivastigmine    REVIEW OF SYSTEMS     Unable to perform due to mechanical ventilation and acutely comatose state  PHYSICAL EXAMINATION   Vital Signs: Temp:  [93 F (33.9 C)-95.7 F (35.4 C)] 95.7 F (35.4 C) (05/29 1715) Pulse Rate:  [78-99] 78 (05/29 1715) Resp:  [16-24] 20 (05/29 1715) BP: (86-143)/(51-74) 86/51 (05/29 1715) SpO2:  [98 %-100 %] 100 % (05/29 1715) FiO2 (%):  [40 %-100 %] 40 % (05/29 1414) Weight:  [142.8 kg] 142.8 kg (05/29 1414)  GENERAL: Morbidly obese age-appropriate, acutely comatose HEAD: Normocephalic, atraumatic.  EYES: Pupils equal, round, reactive to light.  No scleral icterus.  MOUTH: Moist mucosal membrane. NECK: Supple. No thyromegaly. No nodules. No JVD.  PULMONARY: Rhonchorous breath sounds with crackles in all lung zones bilaterally CARDIOVASCULAR: S1 and S2. Regular rate and rhythm. No murmurs, rubs, or  gallops.  GASTROINTESTINAL: Soft, nontender, non-distended. No masses. Positive bowel sounds. No hepatosplenomegaly.  Obese abdomen MUSCULOSKELETAL: No swelling, clubbing, or edema.  NEUROLOGIC: GCS 4 T SKIN:intact,warm,dry   PERTINENT DATA     Infusions: . dexmedetomidine (PRECEDEX) IV infusion 0.4 mcg/kg/hr (11/03/2020 1407)  . levETIRAcetam     Followed by  . [START ON 11/19/2020] levETIRAcetam    . midazolam    . piperacillin-tazobactam (ZOSYN)  IV    . vancomycin 2,000 mg (11/05/2020 1631)   Scheduled Medications: . sodium chloride   Intravenous Once  . chlorhexidine gluconate (MEDLINE KIT)  15 mL Mouth Rinse BID  . [START ON 11/19/2020] Chlorhexidine Gluconate Cloth  6 each Topical Q0600  . mouth rinse  15 mL Mouth Rinse 10 times per day  . vancomycin variable dose per unstable renal function (pharmacist dosing)   Does not apply See admin instructions   PRN Medications: docusate sodium, fentaNYL (SUBLIMAZE) injection, fentaNYL (SUBLIMAZE) injection, midazolam, midazolam, polyethylene glycol Hemodynamic parameters:   Intake/Output: No intake/output data recorded.  Ventilator  Settings: Vent Mode: PRVC FiO2 (%):  [40 %-100 %] 40 % Set Rate:  [16 bmp] 16 bmp Vt Set:  [500 mL] 500 mL PEEP:  [5 cmH20] 5 cmH20   LAB RESULTS:  Basic Metabolic Panel: Recent Labs  Lab 10/25/2020 1207  NA 144  K 4.8  CL 111  CO2 19*  GLUCOSE 175*  BUN 99*  CREATININE 3.18*  CALCIUM 9.0  MG 2.6*   Liver Function Tests: Recent Labs  Lab 11/15/2020 1207  AST 179*  ALT 159*  ALKPHOS 108  BILITOT 0.5  PROT 5.6*  ALBUMIN 2.8*   No results for input(s): LIPASE, AMYLASE in the last 168 hours. No results for input(s): AMMONIA in the last 168 hours. CBC: Recent Labs  Lab 10/25/2020 1241 11/14/2020 1336  WBC 12.7* 13.5*  NEUTROABS  --  12.0*  HGB 2.5* 2.5*  HCT 8.0* 7.9*  MCV 67.2* 68.1*  PLT 153 151   Cardiac Enzymes: No results for input(s): CKTOTAL, CKMB, CKMBINDEX, TROPONINI  in the last 168 hours. BNP: Invalid input(s): POCBNP CBG: No results for input(s): GLUCAP in the last 168 hours.     IMAGING RESULTS:  Imaging: CT HEAD WO CONTRAST  Result Date: 11/08/2020 CLINICAL DATA:  Suspected stroke. EXAM: CT HEAD WITHOUT CONTRAST TECHNIQUE: Contiguous axial images were obtained from the base of the skull through the vertex without intravenous contrast. COMPARISON:  04/20/2021 FINDINGS: Brain: No evidence of acute infarction, hemorrhage, hydrocephalus, extra-axial collection or mass lesion/mass effect. Moderate brain parenchymal volume loss and deep  white matter microangiopathy. 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 Vascular: Calcific atherosclerotic disease of the intra cavernous carotid arteries. Skull: Normal. Negative for fracture or focal lesion. Sinuses/Orbits: No acute finding. Other: None. IMPRESSION: 1. No acute intracranial abnormality. 2. Atrophy, chronic microvascular disease. Electronically Signed   By: Fidela Salisbury M.D.   On: 11/14/2020 14:26   DG Chest Port 1 View  Result Date: 11/10/2020 CLINICAL DATA:  80 year old male with central line placement. EXAM: PORTABLE CHEST 1 VIEW COMPARISON:  Earlier chest radiograph dated 11/09/2020 and CT dated 11/04/2020 FINDINGS: Interval placement of a right IJ central venous line with tip close to the cavoatrial junction or over the right atrium. Endotracheal tube approximately 2 cm above the carina. The tube can be pulled back by 2 cm for optimal positioning. Enteric tube with tip in the left upper abdomen likely in the gastric fundus. Shallow inspiration with bilateral streaky densities similar to prior radiograph. No large pleural effusion or pneumothorax. Stable cardiomegaly. Atherosclerotic calcification of the aorta. No acute osseous pathology. IMPRESSION: 1. Interval placement of a right IJ central venous line with tip close to the cavoatrial junction. No pneumothorax.  2. Endotracheal tube above the carina. The tube can be pulled back by 2 cm for optimal positioning. 3. No interval change in bilateral pulmonary opacities. Electronically Signed   By: Anner Crete M.D.   On: 11/13/2020 15:27   DG Chest Portable 1 View  Result Date: 11/09/2020 CLINICAL DATA:  80 year old male status post intubation. EXAM: PORTABLE CHEST 1 VIEW COMPARISON:  Chest x-ray 06/28/2020. FINDINGS: An endotracheal tube is in place with tip 2.5 cm above the carina. A nasogastric tube is seen extending into the stomach, however, the tip of the nasogastric tube extends below the lower margin of the image. Transcutaneous defibrillator pads project over the lower left hemithorax. Lung volumes are low. Cephalization of the pulmonary vasculature. Airspace consolidation in the right upper lobe and periphery of the right lung base. Left lung otherwise appears clear. No definite pleural effusions. No pneumothorax. Heart size is mildly enlarged. Upper mediastinal contours are distorted by patient positioning. Aortic atherosclerosis. IMPRESSION: 1. Support apparatus, as above. 2. Right upper lobe airspace consolidation and additional consolidative changes in the right lung base concerning for multilobar pneumonia. 3. Cardiomegaly with pulmonary venous congestion. 4. Aortic atherosclerosis. Electronically Signed   By: Vinnie Langton M.D.   On: 11/17/2020 12:29   DG Abd Portable 1 View  Result Date: 10/26/2020 CLINICAL DATA:  80 year old male status post intubation. EXAM: PORTABLE ABDOMEN - 1 VIEW COMPARISON:  No priors. FINDINGS: Nasogastric tube in the mid stomach. IVC filter projecting over the right mid abdomen. Gas and stool are seen scattered throughout the colon extending to the level of the distal rectum. No pathologic distension of small bowel is noted. No gross evidence of pneumoperitoneum. IMPRESSION: 1. Support apparatus, as above. 2. Nonobstructive bowel gas pattern. Electronically Signed   By:  Vinnie Langton M.D.   On: 11/02/2020 12:31   ECHOCARDIOGRAM COMPLETE  Result Date: 11/15/2020    ECHOCARDIOGRAM REPORT   Patient Name:   [facility]. Date of Exam: 11/16/2020 Medical Rec #:  [id number]          Height:       70.5 in Accession #:    [id number]         Weight:       314.8 lb Date of Birth:  April 11, 1941  BSA:          2.545 m Patient Age:    11 years           BP:           134/69 mmHg Patient Gender: M                  HR:           92 bpm. Exam Location:  [facility] Procedure: 2D Echo and Intracardiac Opacification Agent Indications:     Cardiac arrest I46.9  History:         Patient has no prior history of Echocardiogram examinations.  Sonographer:     Kathlen Brunswick RDCS Referring Phys:  [id number] Ottie Glazier Diagnosing Phys: Ida Rogue MD  Sonographer Comments: Technically challenging study due to limited acoustic windows, suboptimal subcostal window and echo performed with patient supine and on artificial respirator. Image acquisition challenging due to respiratory motion and Image acquisition challenging due to patient body habitus. IMPRESSIONS  1. Left ventricular ejection fraction, by estimation, is 60 to 65%. The left ventricle has normal function. The left ventricle has no regional wall motion abnormalities. There is moderate left ventricular hypertrophy. Left ventricular diastolic parameters are consistent with Grade I diastolic dysfunction (impaired relaxation).  2. Right ventricular systolic function is normal. The right ventricular size is normal. Tricuspid regurgitation signal is inadequate for assessing PA pressure.  3. The mitral valve is normal in structure. No evidence of mitral valve regurgitation. No evidence of mitral stenosis. FINDINGS  Left Ventricle: Left ventricular ejection fraction, by estimation, is 60 to 65%. The left ventricle has normal function. The left ventricle has no regional wall motion abnormalities. Definity contrast agent was given IV to  delineate the left ventricular  endocardial borders. The left ventricular internal cavity size was normal in size. There is moderate left ventricular hypertrophy. Left ventricular diastolic parameters are consistent with Grade I diastolic dysfunction (impaired relaxation). Right Ventricle: The right ventricular size is normal. No increase in right ventricular wall thickness. Right ventricular systolic function is normal. Tricuspid regurgitation signal is inadequate for assessing PA pressure. Left Atrium: Left atrial size was normal in size. Right Atrium: Right atrial size was normal in size. Pericardium: There is no evidence of pericardial effusion. Mitral Valve: The mitral valve is normal in structure. No evidence of mitral valve regurgitation. No evidence of mitral valve stenosis. Tricuspid Valve: The tricuspid valve is normal in structure. Tricuspid valve regurgitation is mild . No evidence of tricuspid stenosis. Aortic Valve: The aortic valve is normal in structure. Aortic valve regurgitation is not visualized. No aortic stenosis is present. Aortic valve mean gradient measures 7.0 mmHg. Aortic valve peak gradient measures 14.7 mmHg. Pulmonic Valve: The pulmonic valve was normal in structure. Pulmonic valve regurgitation is not visualized. No evidence of pulmonic stenosis. Aorta: The aortic root is normal in size and structure. Venous: The inferior vena cava is normal in size with greater than 50% respiratory variability, suggesting right atrial pressure of 3 mmHg. IAS/Shunts: No atrial level shunt detected by color flow Doppler.  LEFT VENTRICLE PLAX 2D LVIDd:         3.66 cm Diastology LVIDs:         2.60 cm LV e' medial:    9.03 cm/s LV PW:         1.34 cm LV E/e' medial:  7.6 LV IVS:        1.30 cm LV e' lateral:   11.30 cm/s  LV E/e' lateral: 6.1  RIGHT VENTRICLE RV S prime:     13.75 cm/s TAPSE (M-mode): 3.7 cm LEFT ATRIUM           Index       RIGHT ATRIUM           Index LA diam:       4.00 cm 1.57 cm/m  RA Area:     13.10 cm LA Vol (A4C): 52.7 ml 20.71 ml/m RA Volume:   33.30 ml  13.09 ml/m  AORTIC VALVE AV Vmax:           191.50 cm/s AV Vmean:          121.000 cm/s AV VTI:            0.317 m AV Peak Grad:      14.7 mmHg AV Mean Grad:      7.0 mmHg LVOT Vmax:         131.00 cm/s LVOT Vmean:        83.700 cm/s LVOT VTI:          0.249 m LVOT/AV VTI ratio: 0.79 MITRAL VALVE               TRICUSPID VALVE MV Area (PHT): 3.68 cm    TV Peak grad:   29.1 mmHg MV Decel Time: 206 msec    TV Vmax:        2.70 m/s MV E velocity: 68.40 cm/s MV A velocity: 98.50 cm/s  SHUNTS MV E/A ratio:  0.69        Systemic VTI: 0.25 m Ida Rogue MD Electronically signed by Ida Rogue MD Signature Date/Time: 11/02/2020/4:34:13 PM    Final    CT CHEST ABDOMEN PELVIS WO CONTRAST  Result Date: 11/20/2020 CLINICAL DATA:  Pt arrived ems from home. Pt was at home and collapsed and CPR was started. 18 mins of CPR. 3 epi, 2 bicarb, 1 calcium. ROSC achieved. While en route, pt lost pulses again but after 2 round of CPR ROSC was obtained again with history of diabetes, peripheral vascular disease with nonhealing ulcers on the right lower extremity, hypertension, congestive heart failure, BPH, history of DVT, history of COVID infection, dementia and chronic kidney disease stage IIIb with a baseline creatinine of about 2.4 as of 2 weeks ago. Patient sees Dr. Murlean Iba in the office. He was last seen in last month.As per the family while sitting in the car he became on responsive. The EMT came and did CPR. Patient was unresponsive for about 20 minutes finally he regained pulse. Patient is now intubated and sedated EXAM: CT CHEST, ABDOMEN AND PELVIS WITHOUT CONTRAST TECHNIQUE: Multidetector CT imaging of the chest, abdomen and pelvis was performed following the standard protocol without IV contrast. Examlimited by lack of intravenous and oral contrast and due to artifact from the arms being at patient's side.  COMPARISON:  07/21/2020. FINDINGS: CT CHEST FINDINGS Cardiovascular: Heart is normal in size and configuration. No pericardial effusion. Great vessels normal in caliber. Mild aortic atherosclerosis. Mediastinum/Nodes: Endotracheal tube tip projects at the upper level of the carina. Nasal/orogastric tube passes below the diaphragm, well within the stomach. No neck base, axillary, mediastinal or hilar masses or enlarged lymph nodes. Trachea and esophagus are grossly unremarkable. Lungs/Pleura: Small pleural effusions. Bilateral patchy areas of ground-glass and more confluent opacity, most evident in the right upper lobe. Additional opacity is noted dependently in the lower lobes consistent with atelectasis. Mild interstitial thickening bilaterally. No mass. No suspicious  nodule. No pneumothorax. Musculoskeletal: Subtle nondisplaced fractures of the anterior right 7 anterior left sixth ribs. No other fractures. No bone lesions. CT ABDOMEN PELVIS FINDINGS Hepatobiliary: Limited assessment of the from the liver and gallbladder due to artifact arms. Liver normal in size. No convincing mass. Gallbladder unremarkable. No evidence duct dilation. Pancreas: Assessment also somewhat limited by artifact from the arms. No mass or inflammation. Spleen: Normal in size without focal abnormality. Adrenals/Urinary Tract: No adrenal masses. Kidneys normal in orientation and position. Areas of renal cortical thinning, likely scarring. No convincing renal mass, no stone and no hydronephrosis. Ureters not well visualized, but no evidence of a ureteral stone or ureteral dilation. Bladder is decompressed with a Foley catheter. Stomach/Bowel: Stomach is unremarkable. Small bowel and colon are normal in caliber. No wall thickening. No inflammation. Scattered left colon diverticula. Mild increase in the colonic stool burden. No evidence of appendicitis. Vascular/Lymphatic: Stable vena cava filter. Single tiny focus of aortic atherosclerotic  calcification. No aneurysm. No enlarged lymph nodes. Reproductive: Unremarkable. Other: No abdominal wall hernia.  No ascites. Musculoskeletal: No fracture or acute finding.  No bone lesion. IMPRESSION: CT CHEST 1. Patchy bilateral ground-glass and confluent airspace opacities, most evident in the right upper lobe, which could be the result CPR, but is more suggestive of multifocal pneumonia. 2. Nondisplaced fractures of the anterior right seventh and anterior left sixth ribs presumably occurring during CPR. 3. Dependent opacity in both lower lobes consistent with atelectasis. Small effusions. ABDOMEN AND PELVIS CT 1. No acute findings within the abdomen or pelvis. Electronically Signed   By: Lajean Manes M.D.   On: 11/14/2020 14:30   @PROBHOSP @ CT HEAD WO CONTRAST  Result Date: 10/23/2020 CLINICAL DATA:  Suspected stroke. EXAM: CT HEAD WITHOUT CONTRAST TECHNIQUE: Contiguous axial images were obtained from the base of the skull through the vertex without intravenous contrast. COMPARISON:  04/20/2021 FINDINGS: Brain: No evidence of acute infarction, hemorrhage, hydrocephalus, extra-axial collection or mass lesion/mass effect. Moderate brain parenchymal volume loss and deep white matter microangiopathy. 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 Vascular: Calcific atherosclerotic disease of the intra cavernous carotid arteries. Skull: Normal. Negative for fracture or focal lesion. Sinuses/Orbits: No acute finding. Other: None. IMPRESSION: 1. No acute intracranial abnormality. 2. Atrophy, chronic microvascular disease. Electronically Signed   By: Fidela Salisbury M.D.   On: 11/10/2020 14:26   DG Chest Port 1 View  Result Date: 11/08/2020 CLINICAL DATA:  81 year old male with central line placement. EXAM: PORTABLE CHEST 1 VIEW COMPARISON:  Earlier chest radiograph dated 11/03/2020 and CT dated 11/19/2020 FINDINGS: Interval placement of a right IJ central venous line with  tip close to the cavoatrial junction or over the right atrium. Endotracheal tube approximately 2 cm above the carina. The tube can be pulled back by 2 cm for optimal positioning. Enteric tube with tip in the left upper abdomen likely in the gastric fundus. Shallow inspiration with bilateral streaky densities similar to prior radiograph. No large pleural effusion or pneumothorax. Stable cardiomegaly. Atherosclerotic calcification of the aorta. No acute osseous pathology. IMPRESSION: 1. Interval placement of a right IJ central venous line with tip close to the cavoatrial junction. No pneumothorax. 2. Endotracheal tube above the carina. The tube can be pulled back by 2 cm for optimal positioning. 3. No interval change in bilateral pulmonary opacities. Electronically Signed   By: Anner Crete M.D.   On: 11/05/2020 15:27   DG Chest Portable 1 View  Result Date: 11/16/2020 CLINICAL DATA:  80 year old male status post intubation. EXAM: PORTABLE CHEST 1 VIEW COMPARISON:  Chest x-ray 06/28/2020. FINDINGS: An endotracheal tube is in place with tip 2.5 cm above the carina. A nasogastric tube is seen extending into the stomach, however, the tip of the nasogastric tube extends below the lower margin of the image. Transcutaneous defibrillator pads project over the lower left hemithorax. Lung volumes are low. Cephalization of the pulmonary vasculature. Airspace consolidation in the right upper lobe and periphery of the right lung base. Left lung otherwise appears clear. No definite pleural effusions. No pneumothorax. Heart size is mildly enlarged. Upper mediastinal contours are distorted by patient positioning. Aortic atherosclerosis. IMPRESSION: 1. Support apparatus, as above. 2. Right upper lobe airspace consolidation and additional consolidative changes in the right lung base concerning for multilobar pneumonia. 3. Cardiomegaly with pulmonary venous congestion. 4. Aortic atherosclerosis. Electronically Signed   By:  Vinnie Langton M.D.   On: 11/17/2020 12:29   DG Abd Portable 1 View  Result Date: 11/08/2020 CLINICAL DATA:  80 year old male status post intubation. EXAM: PORTABLE ABDOMEN - 1 VIEW COMPARISON:  No priors. FINDINGS: Nasogastric tube in the mid stomach. IVC filter projecting over the right mid abdomen. Gas and stool are seen scattered throughout the colon extending to the level of the distal rectum. No pathologic distension of small bowel is noted. No gross evidence of pneumoperitoneum. IMPRESSION: 1. Support apparatus, as above. 2. Nonobstructive bowel gas pattern. Electronically Signed   By: Vinnie Langton M.D.   On: 11/09/2020 12:31   ECHOCARDIOGRAM COMPLETE  Result Date: 11/11/2020    ECHOCARDIOGRAM REPORT   Patient Name:   [facility]. Date of Exam: 11/07/2020 Medical Rec #:  [id number]          Height:       70.5 in Accession #:    [id number]         Weight:       314.8 lb Date of Birth:  Mar 16, 1941           BSA:          2.545 m Patient Age:    3 years           BP:           134/69 mmHg Patient Gender: M                  HR:           92 bpm. Exam Location:  [facility] Procedure: 2D Echo and Intracardiac Opacification Agent Indications:     Cardiac arrest I46.9  History:         Patient has no prior history of Echocardiogram examinations.  Sonographer:     Kathlen Brunswick RDCS Referring Phys:  [id number] Ottie Glazier Diagnosing Phys: Ida Rogue MD  Sonographer Comments: Technically challenging study due to limited acoustic windows, suboptimal subcostal window and echo performed with patient supine and on artificial respirator. Image acquisition challenging due to respiratory motion and Image acquisition challenging due to patient body habitus. IMPRESSIONS  1. Left ventricular ejection fraction, by estimation, is 60 to 65%. The left ventricle has normal function. The left ventricle has no regional wall motion abnormalities. There is moderate left ventricular hypertrophy. Left ventricular  diastolic parameters are consistent with Grade I diastolic dysfunction (impaired relaxation).  2. Right ventricular systolic function is normal. The right ventricular size is normal. Tricuspid regurgitation signal is inadequate for assessing PA pressure.  3. The mitral valve is normal in structure. No evidence  of mitral valve regurgitation. No evidence of mitral stenosis. FINDINGS  Left Ventricle: Left ventricular ejection fraction, by estimation, is 60 to 65%. The left ventricle has normal function. The left ventricle has no regional wall motion abnormalities. Definity contrast agent was given IV to delineate the left ventricular  endocardial borders. The left ventricular internal cavity size was normal in size. There is moderate left ventricular hypertrophy. Left ventricular diastolic parameters are consistent with Grade I diastolic dysfunction (impaired relaxation). Right Ventricle: The right ventricular size is normal. No increase in right ventricular wall thickness. Right ventricular systolic function is normal. Tricuspid regurgitation signal is inadequate for assessing PA pressure. Left Atrium: Left atrial size was normal in size. Right Atrium: Right atrial size was normal in size. Pericardium: There is no evidence of pericardial effusion. Mitral Valve: The mitral valve is normal in structure. No evidence of mitral valve regurgitation. No evidence of mitral valve stenosis. Tricuspid Valve: The tricuspid valve is normal in structure. Tricuspid valve regurgitation is mild . No evidence of tricuspid stenosis. Aortic Valve: The aortic valve is normal in structure. Aortic valve regurgitation is not visualized. No aortic stenosis is present. Aortic valve mean gradient measures 7.0 mmHg. Aortic valve peak gradient measures 14.7 mmHg. Pulmonic Valve: The pulmonic valve was normal in structure. Pulmonic valve regurgitation is not visualized. No evidence of pulmonic stenosis. Aorta: The aortic root is normal in size and  structure. Venous: The inferior vena cava is normal in size with greater than 50% respiratory variability, suggesting right atrial pressure of 3 mmHg. IAS/Shunts: No atrial level shunt detected by color flow Doppler.  LEFT VENTRICLE PLAX 2D LVIDd:         3.66 cm Diastology LVIDs:         2.60 cm LV e' medial:    9.03 cm/s LV PW:         1.34 cm LV E/e' medial:  7.6 LV IVS:        1.30 cm LV e' lateral:   11.30 cm/s                        LV E/e' lateral: 6.1  RIGHT VENTRICLE RV S prime:     13.75 cm/s TAPSE (M-mode): 3.7 cm LEFT ATRIUM           Index       RIGHT ATRIUM           Index LA diam:      4.00 cm 1.57 cm/m  RA Area:     13.10 cm LA Vol (A4C): 52.7 ml 20.71 ml/m RA Volume:   33.30 ml  13.09 ml/m  AORTIC VALVE AV Vmax:           191.50 cm/s AV Vmean:          121.000 cm/s AV VTI:            0.317 m AV Peak Grad:      14.7 mmHg AV Mean Grad:      7.0 mmHg LVOT Vmax:         131.00 cm/s LVOT Vmean:        83.700 cm/s LVOT VTI:          0.249 m LVOT/AV VTI ratio: 0.79 MITRAL VALVE               TRICUSPID VALVE MV Area (PHT): 3.68 cm    TV Peak grad:   29.1 mmHg MV Decel Time: 206 msec  TV Vmax:        2.70 m/s MV E velocity: 68.40 cm/s MV A velocity: 98.50 cm/s  SHUNTS MV E/A ratio:  0.69        Systemic VTI: 0.25 m Ida Rogue MD Electronically signed by Ida Rogue MD Signature Date/Time: 11/10/2020/4:34:13 PM    Final    CT CHEST ABDOMEN PELVIS WO CONTRAST  Result Date: 11/15/2020 CLINICAL DATA:  Pt arrived ems from home. Pt was at home and collapsed and CPR was started. 18 mins of CPR. 3 epi, 2 bicarb, 1 calcium. ROSC achieved. While en route, pt lost pulses again but after 2 round of CPR ROSC was obtained again with history of diabetes, peripheral vascular disease with nonhealing ulcers on the right lower extremity, hypertension, congestive heart failure, BPH, history of DVT, history of COVID infection, dementia and chronic kidney disease stage IIIb with a baseline creatinine of about 2.4  as of 2 weeks ago. Patient sees Dr. Murlean Iba in the office. He was last seen in last month.As per the family while sitting in the car he became on responsive. The EMT came and did CPR. Patient was unresponsive for about 20 minutes finally he regained pulse. Patient is now intubated and sedated EXAM: CT CHEST, ABDOMEN AND PELVIS WITHOUT CONTRAST TECHNIQUE: Multidetector CT imaging of the chest, abdomen and pelvis was performed following the standard protocol without IV contrast. Examlimited by lack of intravenous and oral contrast and due to artifact from the arms being at patient's side. COMPARISON:  07/21/2020. FINDINGS: CT CHEST FINDINGS Cardiovascular: Heart is normal in size and configuration. No pericardial effusion. Great vessels normal in caliber. Mild aortic atherosclerosis. Mediastinum/Nodes: Endotracheal tube tip projects at the upper level of the carina. Nasal/orogastric tube passes below the diaphragm, well within the stomach. No neck base, axillary, mediastinal or hilar masses or enlarged lymph nodes. Trachea and esophagus are grossly unremarkable. Lungs/Pleura: Small pleural effusions. Bilateral patchy areas of ground-glass and more confluent opacity, most evident in the right upper lobe. Additional opacity is noted dependently in the lower lobes consistent with atelectasis. Mild interstitial thickening bilaterally. No mass. No suspicious nodule. No pneumothorax. Musculoskeletal: Subtle nondisplaced fractures of the anterior right 7 anterior left sixth ribs. No other fractures. No bone lesions. CT ABDOMEN PELVIS FINDINGS Hepatobiliary: Limited assessment of the from the liver and gallbladder due to artifact arms. Liver normal in size. No convincing mass. Gallbladder unremarkable. No evidence duct dilation. Pancreas: Assessment also somewhat limited by artifact from the arms. No mass or inflammation. Spleen: Normal in size without focal abnormality. Adrenals/Urinary Tract: No adrenal masses.  Kidneys normal in orientation and position. Areas of renal cortical thinning, likely scarring. No convincing renal mass, no stone and no hydronephrosis. Ureters not well visualized, but no evidence of a ureteral stone or ureteral dilation. Bladder is decompressed with a Foley catheter. Stomach/Bowel: Stomach is unremarkable. Small bowel and colon are normal in caliber. No wall thickening. No inflammation. Scattered left colon diverticula. Mild increase in the colonic stool burden. No evidence of appendicitis. Vascular/Lymphatic: Stable vena cava filter. Single tiny focus of aortic atherosclerotic calcification. No aneurysm. No enlarged lymph nodes. Reproductive: Unremarkable. Other: No abdominal wall hernia.  No ascites. Musculoskeletal: No fracture or acute finding.  No bone lesion. IMPRESSION: CT CHEST 1. Patchy bilateral ground-glass and confluent airspace opacities, most evident in the right upper lobe, which could be the result CPR, but is more suggestive of multifocal pneumonia. 2. Nondisplaced fractures of the anterior right seventh and anterior left  sixth ribs presumably occurring during CPR. 3. Dependent opacity in both lower lobes consistent with atelectasis. Small effusions. ABDOMEN AND PELVIS CT 1. No acute findings within the abdomen or pelvis. Electronically Signed   By: Lajean Manes M.D.   On: 11/08/2020 14:30     ASSESSMENT AND PLAN    -Multidisciplinary rounds held today  Acute hypoxemic respiratory failure - present on admission  - COVID19- pending - on mechanical ventilation  - will perform infectious workup for pneumonia -Respiratory viral panel -serum fungitell -legionella ab -strep pneumoniae ur AG -Histoplasma Ur Ag -sputum resp cultures -AFB sputum expectorated specimen -sputum cytology  -reviewed pertinent imaging with patient today - ESR -PT/OT for d/c planning  -please encourage patient to use incentive spirometer few times each hour while hospitalized.      Cardiac Arrest PEA - ACLS w ROSC   - cardiology consult - reviewed patient with cardiology team - [place] sun initated -holding xarelto  -holding asa  -CT head Stat.  -follow up cardiac enzymes as indicated ICU monitoring   Severe anemia   - possible due to acute blood loss  -transfuse blood - patient does not have religions restrictions   - MCV 60s suggestive of possible underlying thalasemia/ssD   -G6PD testing   - haptoglobin   -pathology slide review   - CKD 4 - may be EPO candidate -heme/onc evaluation  Renal Failure-Acute on chronic stage -4 -follow chem 7 -follow UO -continue Foley Catheter-assess need daily -Nephrology on case - Dr Lanora Manis - -appreciate input +casts   Acutely comatose state - intubated emergently on mechanical ventilation  -will check CT head for ICH - minimal sedation to achieve a RASS goal: -1 Wake up assessment pending   ID -continue IV abx as prescibed-empirically treating for infection -follow up cultures  GI/Nutrition GI PROPHYLAXIS as indicated DIET-->TF's as tolerated Constipation protocol as indicated  ENDO - ICU hypoglycemic\Hyperglycemia protocol -check FSBS per protocol   ELECTROLYTES -follow labs as needed -replace as needed -pharmacy consultation   DVT/GI PRX ordered -SCDs  TRANSFUSIONS AS NEEDED MONITOR FSBS ASSESS the need for LABS as needed   Critical care provider statement:    Critical care time (minutes):  109   Critical care time was exclusive of:  Separately billable procedures and treating other patients   Critical care was necessary to treat or prevent imminent or life-threatening deterioration of the following conditions:  acutely comatose, cardiac arrest, Acute on chronic renal failure stage 4   Critical care was time spent personally by me on the following activities:  Development of treatment plan with patient or surrogate, discussions with consultants, evaluation of patient's response  to treatment, examination of patient, obtaining history from patient or surrogate, ordering and performing treatments and interventions, ordering and review of laboratory studies and re-evaluation of patient's condition.  I assumed direction of critical care for this patient from another provider in my specialty: no    This document was prepared using Dragon voice recognition software and may include unintentional dictation errors.    Ottie Glazier, M.D.  Division of [place]

## 2020-11-18 NOTE — ED Provider Notes (Signed)
Justin Shannon   ____________________________________________   Event Date/Time   First MD Initiated Contact with Patient 11/04/2020 1211     (approximate)  I have reviewed the triage vital signs and the nursing notes.   HISTORY  Chief Complaint Cardiac Arrest    HPI [facility] [name] is a 80 y.o. male with past medical history of hypertension, CKD, diastolic CHF, DVT on Xarelto, and Lewy body dementia who presents to the ED for cardiac arrest.  Family states that they were getting ready to go to church when patient suddenly seemed to slump over in the passenger seat of the car.  He was still breathing on his own at this time but was unresponsive, when police arrived on the scene they began CPR.  Patient noted to be in asystole or PEA at the time of EMS arrival, compressions were continued for a total of 18 minutes after which patient had ROSC.  He was noted to be in a normal sinus rhythm before again losing pulses and going into PEA.  He received an additional 4 minutes of chest compressions before again having ROSC.  He was given 3 doses of epinephrine along with calcium and bicarb.  Family state that patient has been increasingly short of breath lately, for which his cardiologist has been adjusting his diuretics, but he has not had any other complaints.        Past Medical History:  Diagnosis Date  . (HFpEF) heart failure with preserved ejection fraction ([place])    a. 05/2020 Echo: EF >55%, Gr2 DD. Mildly enlarged RV. Mild LAE. Mild MR.  . Arthritis   . Bilateral swelling of feet   . BPH (benign prostatic hyperplasia)   . CKD (chronic kidney disease), stage III ([place])   . COVID-19 virus infection 06/2019  . Dementia ([place])    a. requires 24 hr care  . Essential hypertension   . Gout   . Left Foot Tendonitis   . Right Lower Extremity DVT (deep venous thrombosis) ([place])    a. On Xarelto; s/p IVC filter.  . Seizures ([place])     a. on keppra  . Sleep apnea     Patient Active Problem List   Diagnosis Date Noted  . Cardiac arrest ([place]) 10/22/2020  . Parkinsonism ([place]) 08/23/2020  . Heart failure with preserved ejection fraction, borderline, class II ([place]) 08/23/2020  . Slurred speech 06/04/2020  . CKD (chronic kidney disease) stage 3, GFR 30-59 ml/min (HCC) 06/04/2020  . Essential hypertension 06/04/2020  . Hyperlipidemia 06/04/2020  . Lewy body dementia without behavioral disturbance ([place]) 05/21/2020  . Hyperlipidemia associated with type 2 diabetes mellitus ([place]) 05/21/2020  . Status post total right knee replacement 01/22/2020  . Anemia of chronic kidney failure, stage 3 (moderate) ([place]) 01/16/2020  . Total knee replacement status 11/02/2019  . History of total knee arthroplasty 11/02/2019  . PAD (peripheral artery disease) ([place]) 10/13/2019  . Benign prostatic hyperplasia   . Seizures ([place])   . Anemia due to stage 4 chronic kidney disease ([place]) 06/24/2019  . Generalized weakness 06/24/2019  . Anemia in chronic kidney disease (CODE) 06/24/2019  . Asthenia 06/24/2019  . Primary osteoarthritis of right knee 04/03/2019  . Use of cane as ambulatory aid 04/27/2018  . Dependence on other enabling machines and devices 04/27/2018  . Type 2 diabetes mellitus with stage 4 chronic kidney disease, without long-term current use of insulin ([place]) 06/11/2017  . Lymphedema 05/04/2016  . Chronic venous  insufficiency 05/04/2016  . Toe sprain 01/24/2016  . Morbid obesity with body mass index of 40.0-44.9 in adult [facility]) 05/24/2014  . Body mass index (BMI)40.0-44.9, adult 05/24/2014  . OSA on CPAP 01/10/2014  . CKD (chronic kidney disease), stage IV ([place]) 11/22/2013  . Allergic rhinitis 11/06/2013  . Benign hypertension with CKD (chronic kidney disease) stage IV ([place]) 11/06/2013  . Epilepsy ([place]) 11/06/2013  . Gout 11/06/2013  . History of DVT (deep vein thrombosis) 11/06/2013  . Chronic deep vein thrombosis (DVT) of  proximal vein of lower extremity ([place]) 11/06/2013  . 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 ([place]) 11/06/2013  . Personal history of other venous thrombosis and embolism 11/06/2013    Past Surgical History:  Procedure Laterality Date  . APPENDECTOMY    . APPLICATION OF WOUND VAC Right 11/02/2019   Procedure: APPLICATION OF WOUND VAC;  Surgeon: Dereck Leep, MD;  Location: [facility] ORS;  Service: Orthopedics;  Laterality: Right;  [id number]  . HERNIA REPAIR     UMBILICAL  . IVC FILTER INSERTION N/A 10/25/2019   Procedure: IVC FILTER INSERTION;  Surgeon: Katha Cabal, MD;  Location: [facility];  Service: Cardiovascular;  Laterality: N/A;  . KNEE ARTHROPLASTY Right 11/02/2019   Procedure: COMPUTER ASSISTED TOTAL KNEE ARTHROPLASTY;  Surgeon: Dereck Leep, MD;  Location: [facility] ORS;  Service: Orthopedics;  Laterality: Right;  . PATELLAR TENDON REPAIR Right 12/05/2019   Procedure: PATELLA TENDON REPAIR;  Surgeon: Dereck Leep, MD;  Location: [facility] ORS;  Service: Orthopedics;  Laterality: Right;  . PROSTATE SURGERY      Prior to Admission medications   Medication Sig Start Date End Date Taking? Authorizing Provider  albuterol (VENTOLIN HFA) 108 (90 Base) MCG/ACT inhaler Inhale 2 puffs into the lungs every 4 (four) hours as needed for wheezing.    [provider]  allopurinol (ZYLOPRIM) 100 MG tablet Take 100 mg by mouth daily.    [provider]  atorvastatin (LIPITOR) 20 MG tablet Take 1 tablet (20 mg total) by mouth at bedtime. 05/21/20   Karamalegos, Devonne Doughty, DO  docusate sodium (COLACE) 100 MG capsule Take 100 mg by mouth daily as needed for mild constipation or moderate constipation.     [provider]  fexofenadine (ALLEGRA) 180 MG tablet Take 180 mg by mouth daily as needed for allergies.     [provider]  finasteride (PROSCAR) 5 MG tablet Take 1 tablet (5 mg total) by mouth  daily. 10/24/20   Debroah Loop, PA-C  fluconazole (DIFLUCAN) 200 MG tablet Take one tab po QD on Monday, Wednesday, and Friday 10/18/20   Ralene Bathe, MD  fluticasone [facility]) 50 MCG/ACT nasal spray Place 2 sprays into both nostrils daily as needed for allergies.  09/26/14   [provider]  ketoconazole (NIZORAL) 2 % cream Apply to aa's groin QD. 10/18/20   Ralene Bathe, MD  levETIRAcetam (KEPPRA) 500 MG tablet Take 500 mg by mouth 2 (two) times daily.  03/08/19   [provider]  LORazepam (ATIVAN) 0.5 MG tablet Take 0.5 mg by mouth every 4 (four) hours as needed. 09/06/20   [provider]  Magnesium 250 MG TABS Take 250 mg by mouth daily.    [provider]  metoprolol succinate (TOPROL-XL) 25 MG 24 hr tablet Take 1 tablet (25 mg total) by mouth daily. 11/16/20   Kate Sable, MD  Multiple Vitamin (ONE-A-DAY MENS PO) Take 1 tablet  by mouth daily.     [provider]  rivaroxaban (XARELTO) 10 MG TABS tablet TAKE ONE TABLET BY MOUTH EVERY DAY WITH BREAKFAST 05/10/20 05/10/21  Kirk Ruths, MD  torsemide (DEMADEX) 20 MG tablet Take 1 tablet (20 mg total) by mouth daily. Take 1 tab by mouth every Mon/Wed/Fri/Sat (4 days a week) 10/22/20   Kate Sable, MD    Allergies Shellfish allergy, Iodinated diagnostic agents, Nsaids, and Rivastigmine  Family History  Problem Relation Age of Onset  . Arthritis Mother   . Hypertension Mother   . Diabetes Brother     Social History Social History   Tobacco Use  . Smoking status: Former Smoker    Packs/day: 1.00    Years: 10.00    Pack years: 10.00    Types: Cigarettes    Quit date: 06/23/1968    Years since quitting: 52.4  . Smokeless tobacco: Never Used  Vaping Use  . Vaping Use: Never used  Substance Use Topics  . Alcohol use: No  . Drug use: No    Review of Systems Unable to obtain secondary to cardiac  arrest  ____________________________________________   PHYSICAL EXAM:  VITAL SIGNS: ED Triage Vitals [11/03/2020 1203]  Enc Vitals Group     BP (!) 143/74     Pulse Rate 88     Resp 16     Temp      Temp src      SpO2 100 %     Weight      Height      Head Circumference      Peak Flow      Pain Score      Pain Loc      Pain Edu?      Excl. in [place]?     Constitutional: Unresponsive. Eyes: Conjunctivae are normal.  Pupils 2 mm and nonreactive. Head: Atraumatic. Nose: No congestion/rhinnorhea. Mouth/Throat: King airway in place. Neck: Normal ROM Cardiovascular: Normal rate, regular rhythm. Grossly normal heart sounds. Respiratory: Normal respiratory effort.  No retractions. Lungs with crackles bilaterally. Gastrointestinal: Soft and nondistended. Genitourinary: deferred Musculoskeletal: Edema to bilateral lower extremities with Unna boot in place to distal right lower extremity. Neurologic: No spontaneous movement noted, does not withdraw from pain. Skin:  Skin is warm, dry and intact. No rash noted. Psychiatric: Unable to assess.  ____________________________________________   LABS (all labs ordered are listed, but only abnormal results are displayed)  Labs Reviewed  COMPREHENSIVE METABOLIC PANEL - Abnormal; Notable for the following components:      Result Value   CO2 19 (*)    Glucose, Bld 175 (*)    BUN 99 (*)    Creatinine, Ser 3.18 (*)    Total Protein 5.6 (*)    Albumin 2.8 (*)    AST 179 (*)    ALT 159 (*)    GFR, Estimated 19 (*)    All other components within normal limits  BLOOD GAS, ARTERIAL - Abnormal; Notable for the following components:   pH, Arterial 7.23 (*)    pO2, Arterial 381 (*)    Bicarbonate 18.4 (*)    Acid-base deficit 8.9 (*)    Allens test (pass/fail) POSITIVE (*)    All other components within normal limits  MAGNESIUM - Abnormal; Notable for the following components:   Magnesium 2.6 (*)    All other components within normal  limits  CBC - Abnormal; Notable for the following components:   WBC 12.7 (*)  RBC 1.19 (*)    Hemoglobin 2.5 (*)    HCT 8.0 (*)    MCV 67.2 (*)    MCH 21.0 (*)    RDW 20.2 (*)    nRBC 5.0 (*)    All other components within normal limits  LACTIC ACID, PLASMA - Abnormal; Notable for the following components:   Lactic Acid, Venous 6.6 (*)    All other components within normal limits  CBC WITH DIFFERENTIAL/PLATELET - Abnormal; Notable for the following components:   WBC 13.5 (*)    RBC 1.16 (*)    Hemoglobin 2.5 (*)    HCT 7.9 (*)    MCV 68.1 (*)    MCH 21.6 (*)    RDW 20.3 (*)    nRBC 4.2 (*)    Neutro Abs 12.0 (*)    Abs Immature Granulocytes 0.18 (*)    All other components within normal limits  TROPONIN I (HIGH SENSITIVITY) - Abnormal; Notable for the following components:   Troponin I (High Sensitivity) 30 (*)    All other components within normal limits  RESP PANEL BY RT-PCR (FLU A&B, COVID) ARPGX2  CULTURE, BLOOD (ROUTINE X 2)  CULTURE, BLOOD (ROUTINE X 2)  MRSA PCR SCREENING  RESPIRATORY PANEL BY PCR  URINE CULTURE  CULTURE, RESPIRATORY W GRAM STAIN  ACID FAST SMEAR (AFB, MYCOBACTERIA)  ACID FAST CULTURE WITH REFLEXED SENSITIVITIES (MYCOBACTERIA)  FUNGITELL, SERUM  LACTIC ACID, PLASMA  HISTOPLASMA ANTIGEN, URINE  STREP PNEUMONIAE URINARY ANTIGEN  LEGIONELLA PNEUMOPHILA SEROGP 1 UR AG  GLUCOSE 6 PHOSPHATE DEHYDROGENASE  PATHOLOGIST SMEAR REVIEW  CBG MONITORING, ED  PREPARE RBC (CROSSMATCH)  TYPE AND SCREEN  DAT, POLYSPECIFIC AHG ([facility] ONLY)  TYPE AND SCREEN  SLIDE CONSULT, PATHOLOGY [facility]  TROPONIN I (HIGH SENSITIVITY)   ____________________________________________  EKG  ED ECG REPORT I, Blake Divine, the attending physician, personally viewed and interpreted this ECG.   Date: 10/21/2020  EKG Time: 12:02  Rate: 87  Rhythm: Sinus arrhythmia  Axis: Normal  Intervals:none  ST&T Change: Nonspecific T wave changes   PROCEDURES  Procedure(s)  performed (including Critical Care):  .Critical Care Performed by: Blake Divine, MD Authorized by: Blake Divine, MD   Critical care provider statement:    Critical care time (minutes):  45   Critical care time was exclusive of:  Separately billable procedures and treating other patients and teaching time   Critical care was necessary to treat or prevent imminent or life-threatening deterioration of the following conditions:  Circulatory failure and cardiac failure   Critical care was time spent personally by me on the following activities:  Discussions with consultants, evaluation of patient's response to treatment, examination of patient, ordering and performing treatments and interventions, ordering and review of laboratory studies, ordering and review of radiographic studies, pulse oximetry, re-evaluation of patient's condition, obtaining history from patient or surrogate and review of old charts   I assumed direction of critical care for this patient from another provider in my specialty: no     Care discussed with: admitting provider   Procedure Name: Intubation Date/Time: 10/22/2020 3:32 PM Performed by: Blake Divine, MD Pre-anesthesia Checklist: Patient identified, Patient being monitored, Emergency Drugs available, Timeout performed and Suction available Oxygen Delivery Method: Non-rebreather mask Preoxygenation: Pre-oxygenation with 100% oxygen Induction Type: Rapid sequence Ventilation: Mask ventilation without difficulty Laryngoscope Size: Glidescope and 4 Grade View: Grade I Tube size: 8.0 mm Number of attempts: 1 Airway Equipment and Method: Video-laryngoscopy Placement Confirmation: ETT inserted through vocal cords under direct  vision,  CO2 detector and Breath sounds checked- equal and bilateral Secured at: 24 cm Tube secured with: ETT holder Dental Injury: Teeth and Oropharynx as per pre-operative assessment          ____________________________________________   INITIAL IMPRESSION / ASSESSMENT AND PLAN / ED COURSE       80 year old male with past medical history of hypertension, CKD, diastolic CHF, DVT on Xarelto, and dementia who presents to the ED following cardiac arrest with a total of 22 minutes downtime.  Patient in normal sinus rhythm upon arrival, no ischemic changes noted on EKG, blood pressure stable at the time of arrival.  [facility] airway was removed and the patient was intubated without difficulty.  Chest x-ray reviewed by me and shows appropriate positioning of ET tube with likely pulmonary edema, possible pneumonia per radiology and we will give dose of IV antibiotics.  I would consider cardiac etiology as well as electrolyte abnormality for his cardiac arrest but troponin only very mildly elevated.  Case discussed with ICU provider for admission, unfortunately CBC required redraw multiple times and eventually resulted as patient was being transported to the ICU.  He was found to have a hemoglobin of 2.5 but no evidence of bleeding on exam and no recent bleeding reported by family.  Transfusion to be started in the ICU, CT head and abdomen/pelvis are unremarkable.      ____________________________________________   FINAL CLINICAL IMPRESSION(S) / ED DIAGNOSES  Final diagnoses:  Cardiac arrest ([place])  Anemia, unspecified type     ED Discharge Orders    None       [place]:  This document was prepared using Dragon voice recognition software and may include unintentional dictation errors.   Blake Divine, MD 11/07/2020 1539

## 2020-11-18 NOTE — Consult Note (Signed)
CRITICAL CARE PROGRESS NOTE    Name: [facility] MRN: [id number] DOB: 09-28-1940     LOS: 0   SUBJECTIVE FINDINGS & SIGNIFICANT EVENTS    Patient description:  This is 80 year old male with a history of CKD 4, chronic venous insufficiency, DVT on Xarelto, peripheral artery disease, CHF seen by [facility] last transthoracic echo done December 2021, morbid obesity, obstructive sleep apnea on CPAP device at home, type 2 diabetes, Lewy body dementia with parkinsonism, chronic anemia history of total knee replacement, seizure disorder came in status post cardiac arrest.  Received ACLS 20 minutes with ROSC.  Lost pulse again and required ACLS second time followed by ROSC required endotracheal intubation in the ER.  Medical intensive care unit admission post cardiac arrest.    Evaluated patient with nephrologist together at the bedside.  Family was present during my evaluation.  They deny flulike illness malaise, myalgia he did previously have COVID-19 and recovered to baseline.  Additional review of systems reveals decreased appetite over 48 hours.  Denies diarrhea nausea vomiting or any additional symptoms.  Labs show chronic anemia, CKD metabolic acidosis with high anion gap.  Vitals without hypotension or shock.  On exam GCS 4 T with plan to perform CT head to rule out intracerebral hemorrhage followed by [employer] cooling protocol post cardiac arrest.    Lines/tubes : Airway 8 mm (Active)  Secured at (cm) 25 cm 11/13/2020 1207  Measured From Lips 11/11/2020 [place] 11/06/2020 1207  Secured By [employer] 11/13/2020 1207  Cuff Pressure (cm H2O) Green OR 18-26 [facility] 11/05/2020 1207  Site Condition Cool;Dry 11/14/2020 1207     Negative Pressure Wound Therapy Knee Right;Anterior (Active)      NG/OG Tube Orogastric 18 Fr. Xray;Confirmed by Surgical Manipulation 60 cm (Active)  Cm Marking at Nare/Corner of Mouth (if applicable) 60 cm 09/32/35 1211     Urethral Catheter Melissa, SN Temperature probe 16 Fr. (Active)  Indication for Insertion or Continuance of Catheter Unstable critically ill patients first 24-48 hours (See Criteria) 11/08/2020 1225  Site Assessment Clean;Intact;Dry 11/20/2020 1225  Catheter Maintenance Bag below level of bladder;Catheter secured;Drainage bag/tubing not touching floor;Insertion date on drainage bag 10/21/2020 1225  Collection Container Standard drainage bag 10/21/2020 1225  Securement Method Tape 11/02/2020 1225    Microbiology/Sepsis markers: Results for orders placed or performed in visit on 10/24/20  Microscopic Examination     Status: Abnormal   Collection Time: 10/24/20 11:07 AM   Urine  Result Value Ref Range Status   WBC, UA None seen 0 - 5 /hpf Final   RBC 0-2 0 - 2 /hpf Final   Epithelial Cells (non renal) None seen 0 - 10 /hpf Final   Casts Present (A) None seen /lpf Final   Cast Type Hyaline casts N/A Final   Bacteria, UA None seen None seen/Few Final    Anti-infectives:  Anti-infectives (From admission, onward)   Start     Dose/Rate Route Frequency Ordered Stop   10/26/2020 1300  piperacillin-tazobactam (ZOSYN) IVPB 3.375 g        3.375 g 100 mL/hr over 30 Minutes Intravenous  Once 11/15/2020 1248     10/25/2020 1300  azithromycin (ZITHROMAX) 500 mg in sodium chloride 0.9 % 250 mL IVPB        500 mg 250 mL/hr over 60 Minutes Intravenous  Once 11/20/2020 1248          PAST MEDICAL HISTORY   Past Medical History:  Diagnosis Date  . Arthritis   . Bilateral swelling of feet   . BPH (benign prostatic hyperplasia)   . Chronic kidney disease   . DVT (deep venous thrombosis) (HCC)    right lower extremity  . History of gout   . Seizures ([place])   . Sleep apnea   . Tendonitis of foot    left     SURGICAL HISTORY   Past Surgical  History:  Procedure Laterality Date  . APPENDECTOMY    . APPLICATION OF WOUND VAC Right 11/02/2019   Procedure: APPLICATION OF WOUND VAC;  Surgeon: Dereck Leep, MD;  Location: [facility] ORS;  Service: Orthopedics;  Laterality: Right;  [id number]  . HERNIA REPAIR     UMBILICAL  . IVC FILTER INSERTION N/A 10/25/2019   Procedure: IVC FILTER INSERTION;  Surgeon: Katha Cabal, MD;  Location: [place] [facility];  Service: Cardiovascular;  Laterality: N/A;  . KNEE ARTHROPLASTY Right 11/02/2019   Procedure: COMPUTER ASSISTED TOTAL KNEE ARTHROPLASTY;  Surgeon: Dereck Leep, MD;  Location: [facility] ORS;  Service: Orthopedics;  Laterality: Right;  . PATELLAR TENDON REPAIR Right 12/05/2019   Procedure: PATELLA TENDON REPAIR;  Surgeon: Dereck Leep, MD;  Location: [facility] ORS;  Service: Orthopedics;  Laterality: Right;  . PROSTATE SURGERY       FAMILY HISTORY   Family History  Problem Relation Age of Onset  . Arthritis Mother   . Hypertension Mother   . Diabetes Brother      SOCIAL HISTORY   Social History   Tobacco Use  . Smoking status: Former Smoker    Packs/day: 1.00    Years: 10.00    Pack years: 10.00    Types: Cigarettes    Quit date: 06/23/1968    Years since quitting: 52.4  . Smokeless tobacco: Never Used  Vaping Use  . Vaping Use: Never used  Substance Use Topics  . Alcohol use: No  . Drug use: No     MEDICATIONS   Current Medication:  Current Facility-Administered Medications:  .  azithromycin (ZITHROMAX) 500 mg in sodium chloride 0.9 % 250 mL IVPB, 500 mg, Intravenous, Once, Blake Divine, MD .  dexmedetomidine (PRECEDEX) 400 MCG/100ML (4 mcg/mL) infusion, 0-1.2 mcg/kg/hr, Intravenous, Continuous, Blake Divine, MD, Last Rate: 13.93 mL/hr at 11/19/2020 1218, 0.4 mcg/kg/hr at 10/31/2020 1218 .  fentaNYL (SUBLIMAZE) injection 50 mcg, 50 mcg, Intravenous, Q15 min PRN, Blake Divine, MD .  fentaNYL (SUBLIMAZE) injection 50 mcg, 50 mcg, Intravenous, Q2H PRN, Blake Divine, MD .  midazolam (VERSED) injection 1 mg, 1 mg, Intravenous, Q15 min PRN, Blake Divine, MD .  midazolam (VERSED) injection 1 mg, 1 mg, Intravenous, Q2H PRN, Blake Divine, MD .  piperacillin-tazobactam (ZOSYN) IVPB 3.375 g, 3.375 g, Intravenous, Once, Blake Divine, MD  Current Outpatient Medications:  .  albuterol (VENTOLIN HFA) 108 (90 Base) MCG/ACT inhaler, Inhale 2 puffs into the lungs every 4 (four) hours as needed for wheezing., Disp: , Rfl:  .  allopurinol (ZYLOPRIM) 100 MG tablet, Take 100 mg by mouth daily., Disp: , Rfl:  .  atorvastatin (LIPITOR) 20 MG tablet, Take 1 tablet (20 mg total) by mouth at bedtime., Disp: 90 tablet, Rfl: 3 .  docusate sodium (COLACE) 100 MG capsule, Take 100 mg by mouth daily as needed for mild constipation or moderate constipation. , Disp: , Rfl:  .  fexofenadine (ALLEGRA) 180 MG tablet, Take 180 mg by mouth daily as needed for allergies. , Disp: , Rfl:  .  finasteride (PROSCAR) 5 MG tablet, Take 1 tablet (5 mg total) by mouth daily., Disp: 30 tablet, Rfl: 11 .  fluconazole (DIFLUCAN) 200 MG tablet, Take one tab po QD on Monday, Wednesday, and Friday, Disp: 12 tablet, Rfl: 1 .  fluticasone (FLONASE) 50 MCG/ACT nasal spray, Place 2 sprays into both nostrils daily as needed for allergies. , Disp: , Rfl:  .  ketoconazole (NIZORAL) 2 % cream, Apply to aa's groin QD., Disp: 60 g, Rfl: 2 .  levETIRAcetam (KEPPRA) 500 MG tablet, Take 500 mg by mouth 2 (two) times daily. , Disp: , Rfl:  .  LORazepam (ATIVAN) 0.5 MG tablet, Take 0.5 mg by mouth every 4 (four) hours as needed., Disp: , Rfl:  .  Magnesium 250 MG TABS, Take 250 mg by mouth daily., Disp: , Rfl:  .  metoprolol succinate (TOPROL-XL) 25 MG 24 hr tablet, Take 1 tablet (25 mg total) by mouth daily., Disp: 30 tablet, Rfl: 6 .  Multiple Vitamin (ONE-A-DAY MENS PO), Take 1 tablet by mouth daily. , Disp: , Rfl:  .  rivaroxaban (XARELTO) 10 MG TABS tablet, TAKE ONE TABLET BY MOUTH EVERY DAY WITH  BREAKFAST, Disp: 90 tablet, Rfl: 3 .  torsemide (DEMADEX) 20 MG tablet, Take 1 tablet (20 mg total) by mouth daily. Take 1 tab by mouth every Mon/Wed/Fri/Sat (4 days a week), Disp: 30 tablet, Rfl: 3    ALLERGIES   Shellfish allergy, Iodinated diagnostic agents, Nsaids, and Rivastigmine    REVIEW OF SYSTEMS     Unable to perform due to mechanical ventilation and acutely comatose state  PHYSICAL EXAMINATION   Vital Signs: Temp:  [94 F (34.4 C)] 94 F (34.4 C) (05/29 1235) Pulse Rate:  [88-98] 98 (05/29 1235) Resp:  [16-19] 19 (05/29 1235) BP: (111-143)/(56-74) 111/56 (05/29 1235) SpO2:  [100 %] 100 % (05/29 1235) FiO2 (%):  [50 %-100 %] 50 % (05/29 1230)  GENERAL: Morbidly obese age-appropriate, acutely comatose HEAD: Normocephalic, atraumatic.  EYES: Pupils equal, round, reactive to light.  No scleral icterus.  MOUTH: Moist mucosal membrane. NECK: Supple. No thyromegaly. No nodules. No JVD.  PULMONARY: Rhonchorous breath sounds with crackles in all lung zones bilaterally CARDIOVASCULAR: S1 and S2. Regular rate and rhythm. No murmurs, rubs, or gallops.  GASTROINTESTINAL: Soft, nontender, non-distended. No masses. Positive bowel sounds. No hepatosplenomegaly.  Obese abdomen MUSCULOSKELETAL: No swelling, clubbing, or edema.  NEUROLOGIC: GCS 4 T SKIN:intact,warm,dry   PERTINENT DATA     Infusions: . azithromycin (ZITHROMAX) 500 MG IVPB (Vial-Mate [employer])    . dexmedetomidine (PRECEDEX) IV infusion 0.4 mcg/kg/hr (10/28/2020 1218)  . piperacillin-tazobactam     Scheduled Medications:  PRN Medications: fentaNYL (SUBLIMAZE) injection, fentaNYL (SUBLIMAZE) injection, midazolam, midazolam Hemodynamic parameters:   Intake/Output: No intake/output data recorded.  Ventilator  Settings: Vent Mode: AC FiO2 (%):  [50 %-100 %] 50 % Set Rate:  [16 bmp] 16 bmp Vt Set:  [500 mL] 500 mL PEEP:  [5 cmH20] 5 cmH20   LAB RESULTS:  Basic Metabolic Panel: Recent Labs  Lab  11/10/2020 1207  NA 144  K 4.8  CL 111  CO2 19*  GLUCOSE 175*  BUN 99*  CREATININE 3.18*  CALCIUM 9.0   Liver Function Tests: Recent Labs  Lab 11/03/2020 1207  AST 179*  ALT 159*  ALKPHOS 108  BILITOT 0.5  PROT 5.6*  ALBUMIN 2.8*   No results for input(s): LIPASE, AMYLASE in the last 168 hours. No results for input(s): AMMONIA in the last 168 hours. CBC:  No results for input(s): WBC, NEUTROABS, HGB, HCT, MCV, PLT in the last 168 hours. Cardiac Enzymes: No results for input(s): CKTOTAL, CKMB, CKMBINDEX, TROPONINI in the last 168 hours. BNP: Invalid input(s): POCBNP CBG: No results for input(s): GLUCAP in the last 168 hours.     IMAGING RESULTS:  Imaging: DG Chest Portable 1 View  Result Date: 10/22/2020 CLINICAL DATA:  80 year old male status post intubation. EXAM: PORTABLE CHEST 1 VIEW COMPARISON:  Chest x-ray 06/28/2020. FINDINGS: An endotracheal tube is in place with tip 2.5 cm above the carina. A nasogastric tube is seen extending into the stomach, however, the tip of the nasogastric tube extends below the lower margin of the image. Transcutaneous defibrillator pads project over the lower left hemithorax. Lung volumes are low. Cephalization of the pulmonary vasculature. Airspace consolidation in the right upper lobe and periphery of the right lung base. Left lung otherwise appears clear. No definite pleural effusions. No pneumothorax. Heart size is mildly enlarged. Upper mediastinal contours are distorted by patient positioning. Aortic atherosclerosis. IMPRESSION: 1. Support apparatus, as above. 2. Right upper lobe airspace consolidation and additional consolidative changes in the right lung base concerning for multilobar pneumonia. 3. Cardiomegaly with pulmonary venous congestion. 4. Aortic atherosclerosis. Electronically Signed   By: Vinnie Langton M.D.   On: 10/23/2020 12:29   DG Abd Portable 1 View  Result Date: 11/17/2020 CLINICAL DATA:  80 year old male status post  intubation. EXAM: PORTABLE ABDOMEN - 1 VIEW COMPARISON:  No priors. FINDINGS: Nasogastric tube in the mid stomach. IVC filter projecting over the right mid abdomen. Gas and stool are seen scattered throughout the colon extending to the level of the distal rectum. No pathologic distension of small bowel is noted. No gross evidence of pneumoperitoneum. IMPRESSION: 1. Support apparatus, as above. 2. Nonobstructive bowel gas pattern. Electronically Signed   By: Vinnie Langton M.D.   On: 11/10/2020 12:31   @PROBHOSP @ DG Chest Portable 1 View  Result Date: 10/27/2020 CLINICAL DATA:  80 year old male status post intubation. EXAM: PORTABLE CHEST 1 VIEW COMPARISON:  Chest x-ray 06/28/2020. FINDINGS: An endotracheal tube is in place with tip 2.5 cm above the carina. A nasogastric tube is seen extending into the stomach, however, the tip of the nasogastric tube extends below the lower margin of the image. Transcutaneous defibrillator pads project over the lower left hemithorax. Lung volumes are low. Cephalization of the pulmonary vasculature. Airspace consolidation in the right upper lobe and periphery of the right lung base. Left lung otherwise appears clear. No definite pleural effusions. No pneumothorax. Heart size is mildly enlarged. Upper mediastinal contours are distorted by patient positioning. Aortic atherosclerosis. IMPRESSION: 1. Support apparatus, as above. 2. Right upper lobe airspace consolidation and additional consolidative changes in the right lung base concerning for multilobar pneumonia. 3. Cardiomegaly with pulmonary venous congestion. 4. Aortic atherosclerosis. Electronically Signed   By: Vinnie Langton M.D.   On: 11/17/2020 12:29   DG Abd Portable 1 View  Result Date: 10/29/2020 CLINICAL DATA:  80 year old male status post intubation. EXAM: PORTABLE ABDOMEN - 1 VIEW COMPARISON:  No priors. FINDINGS: Nasogastric tube in the mid stomach. IVC filter projecting over the right mid abdomen. Gas and  stool are seen scattered throughout the colon extending to the level of the distal rectum. No pathologic distension of small bowel is noted. No gross evidence of pneumoperitoneum. IMPRESSION: 1. Support apparatus, as above. 2. Nonobstructive bowel gas pattern. Electronically Signed   By: Vinnie Langton M.D.   On: 11/10/2020 12:31  ASSESSMENT AND PLAN    -Multidisciplinary rounds held today  Acute hypoxemic respiratory failure - present on admission  - COVID19- pending - on mechanical ventilation  - will perform infectious workup for pneumonia -Respiratory viral panel -serum fungitell -legionella ab -strep pneumoniae ur AG -Histoplasma Ur Ag -sputum resp cultures -AFB sputum expectorated specimen -sputum cytology  -reviewed pertinent imaging with patient today - ESR -PT/OT for d/c planning  -please encourage patient to use incentive spirometer few times each hour while hospitalized.     Cardiac Arrest PEA - ACLS w ROSC   - cardiology consult - reviewed patient with cardiology team - [place] sun initated -holding xarelto  -holding asa  -CT head Stat.  -follow up cardiac enzymes as indicated ICU monitoring   Severe anemia   - possible due to acute blood loss  -transfuse blood - patient does not have religions restrictions   - MCV 60s suggestive of underlying thalasemia/ssD   -G6PD testing   - haptoglobin   -pathology slide review   - CKD 4 - may be EPO candidate -heme/onc evaluation  Renal Failure-Acute on chronic stage -4 -follow chem 7 -follow UO -continue Foley Catheter-assess need daily -Nephrology on case - Dr Lanora Manis - -appreciate input +casts   Acutely comatose state - intubated emergently on mechanical ventilation  -will check CT head for ICH - minimal sedation to achieve a RASS goal: -1 Wake up assessment pending   ID -continue IV abx as prescibed-empirically treating for infection -follow up cultures  GI/Nutrition GI PROPHYLAXIS as  indicated DIET-->TF's as tolerated Constipation protocol as indicated  ENDO - ICU hypoglycemic\Hyperglycemia protocol -check FSBS per protocol   ELECTROLYTES -follow labs as needed -replace as needed -pharmacy consultation   DVT/GI PRX ordered -SCDs  TRANSFUSIONS AS NEEDED MONITOR FSBS ASSESS the need for LABS as needed   Critical care provider statement:    Critical care time (minutes):  109   Critical care time was exclusive of:  Separately billable procedures and treating other patients   Critical care was necessary to treat or prevent imminent or life-threatening deterioration of the following conditions:  acutely comatose, cardiac arrest, Acute on chronic renal failure stage 4   Critical care was time spent personally by me on the following activities:  Development of treatment plan with patient or surrogate, discussions with consultants, evaluation of patient's response to treatment, examination of patient, obtaining history from patient or surrogate, ordering and performing treatments and interventions, ordering and review of laboratory studies and re-evaluation of patient's condition.  I assumed direction of critical care for this patient from another provider in my specialty: no    This document was prepared using Dragon voice recognition software and may include unintentional dictation errors.    Ottie Glazier, M.D.  Division of [place]

## 2020-11-18 NOTE — Consult Note (Addendum)
Cardiology Consult    Patient ID: Justin Shannon. MRN: [id number], DOB/AGE: 08-28-1940   Admit date: 11/08/2020 Date of Consult: 11/20/2020  Primary Physician: [name], DO Primary Cardiologist: Kate Sable, MD Requesting Provider: F. Aleskerov  Patient Profile    Justin Shannon. is a 80 y.o. 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 Lanney Gins.  Past Medical History   Past Medical History:  Diagnosis Date  . (HFpEF) heart failure with preserved ejection fraction ([place])    a. 05/2020 Echo: EF >55%, Gr2 DD. Mildly enlarged RV. Mild LAE. Mild MR.  . Arthritis   . Bilateral swelling of feet   . BPH (benign prostatic hyperplasia)   . CKD (chronic kidney disease), stage III ([place])   . COVID-19 virus infection 06/2019  . Dementia ([place])    a. requires 24 hr care  . Essential hypertension   . Gout   . Left Foot Tendonitis   . Right Lower Extremity DVT (deep venous thrombosis) ([place])    a. On Xarelto; s/p IVC filter.  . Seizures ([place])    a. on keppra  . Sleep apnea     Past Surgical History:  Procedure Laterality Date  . APPENDECTOMY    . APPLICATION OF WOUND VAC Right 11/02/2019   Procedure: APPLICATION OF WOUND VAC;  Surgeon: Dereck Leep, MD;  Location: [facility];  Service: Orthopedics;  Laterality: Right;  [id number]  . HERNIA REPAIR     UMBILICAL  . IVC FILTER INSERTION N/A 10/25/2019   Procedure: IVC FILTER INSERTION;  Surgeon: Katha Cabal, MD;  Location: [facility];  Service: Cardiovascular;  Laterality: N/A;  . KNEE ARTHROPLASTY Right 11/02/2019   Procedure: COMPUTER ASSISTED TOTAL KNEE ARTHROPLASTY;  Surgeon: Dereck Leep,  MD;  Location: [facility];  Service: Orthopedics;  Laterality: Right;  . PATELLAR TENDON REPAIR Right 12/05/2019   Procedure: PATELLA TENDON REPAIR;  Surgeon: Dereck Leep, MD;  Location: [facility];  Service: Orthopedics;  Laterality: Right;  . PROSTATE SURGERY       Allergies  Allergies  Allergen Reactions  . Shellfish Allergy Anaphylaxis and Other (See Comments)    gout  . Iodinated Diagnostic Agents Other (See Comments)    Chronic kidney disease 3b  . Nsaids Other (See Comments)    Chronic kidney disease 3b  . 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  In the setting of chronic lower extremity edema and HFpEF, has been followed closely by Dr. Garen Lah in the outpatient setting.  An echocardiogram was performed in December 2021 showing an EF greater than 00% grade 2 diastolic dysfunction, and mild mitral regurgitation.  He was most recently seen in clinic on May 27, at which time he reported significant improvement in volume status following titration of torsemide to 20 mg daily.  His weight was down 19 pounds and  in the setting of soft blood pressures, his metoprolol dose was reduced to 25 mg daily.  In the setting of significant comorbidities including decline in function and dementia requiring 24-hour care, it was felt that patient was not a suitable candidate for additional invasive cardiac testing.  Per notes, pt became unresponsive this AM and EMS was called.  Pt initially noted to be unresponsive w/ PEA and CPR/ACLS initiated w/ ROSC after ~ 20 mins.  He lost his pulse a second time, requiring additional CPR/ACLS w/  subsequent ROSC.  He was intubated in the ED.  Family denied any recent complaints.  Imaging in ED: Head CT w/o acute findings; CXR w/ bilateral pulm opacities.  Labs notable for H/H of 2.5/8.0 (2.5/7.9 on repeat) w/ MCV 68.1/MCH 21.6. WBC 13.5. Plts 151.  HsTrop 119.  Pt remains intubated on precedex.    Inpatient Medications    . sodium chloride   Intravenous Once  . chlorhexidine gluconate (MEDLINE KIT)  15 mL Mouth Rinse BID  . [START ON 11/19/2020] Chlorhexidine Gluconate Cloth  6 each Topical Q0600  . mouth rinse  15 mL Mouth Rinse 10 times per day  . vancomycin variable dose per unstable renal function (pharmacist dosing)   Does not apply See admin instructions    Family History    Family History  Problem Relation Age of Onset  . Arthritis Mother   . Hypertension Mother   . Diabetes Brother    He indicated that the status of his mother is unknown. He indicated that his brother is deceased.   Social History    Social History   Socioeconomic History  . Marital status: Married    Spouse name: Not on file  . Number of children: Not on file  . Years of education: Not on file  . Highest education level: Not on file  Occupational History  . Not on file  Tobacco Use  . Smoking status: Former Smoker    Packs/day: 1.00    Years: 10.00    Pack years: 10.00    Types: Cigarettes    Quit date: 06/23/1968    Years since quitting: 52.4  . Smokeless tobacco: Never Used  Vaping Use  . Vaping Use: Never used  Substance and Sexual Activity  . Alcohol use: No  . Drug use: No  . Sexual activity: Not on file  Other Topics Concern  . Not on file  Social History Narrative  . Not on file   Social Determinants of Health   Financial Resource Strain: Low Risk   . Difficulty of Paying Living Expenses: Not hard at all  Food Insecurity: No Food Insecurity  . Worried About Charity fundraiser in the Last Year: Never true  . Ran Out of Food in the Last Year: Never true  Transportation  Needs: No Transportation Needs  . Lack of Transportation (Medical): No  . Lack of Transportation (Non-Medical): No  Physical Activity: Inactive  . Days of Exercise per Week: 0 days  . Minutes of Exercise per Session: 0 min  Stress: No Stress Concern Present  . Feeling of Stress : Only a little  Social Connections: Moderately Isolated  . Frequency of Communication with Friends and Family: More than three times a week  . Frequency of Social Gatherings with Friends and Family: More than three times a week  . Attends Religious Services: Never  . Active Member of Clubs or Organizations: No  . Attends Archivist Meetings: Not on file  .  Marital Status: Married  Human resources officer Violence: Not At Risk  . Fear of Current or Ex-Partner: No  . Emotionally Abused: No  . Physically Abused: No  . Sexually Abused: No     Review of Systems    Pt is intubated, sedated, and unresponsive.  He is not able to provide a ROS @ this time.  Family denied any recent issues.  Physical Exam    Blood pressure 115/73, pulse 86, temperature (!) 95 F (35 C), temperature source Bladder, resp. rate 16, height 5' 10.5" (1.791 m), weight (!) 142.8 kg, SpO2 99 %.  General: Intubated, sedated.  Shaking movements of right side of body. Psych: unresponsive. Neuro: unresponsive HEENT: pupils dilated  Neck: Supple, obese.  No bruits.  Difficult to gauge JVP. Lungs:  Resp regular and unlabored, coarse breath sounds bilat. Heart: RRR no s3, s4, or murmurs. Abdomen: Obese, Soft, non-tender, non-distended, BS + x 4.  Extremities: No clubbing, cyanosis.  Trace bilat pretibial edema. DP/PT 1+, Radials 2+ and equal bilaterally.  Labs    Cardiac Enzymes Recent Labs  Lab 10/22/2020 1207 11/03/2020 1532  TROPONINIHS 30* 119*      Lab Results  Component Value Date   WBC 13.5 (H) 11/20/2020   HGB 2.5 (LL) 10/29/2020   HCT 7.9 (LL) 11/05/2020   MCV 68.1 (L) 11/17/2020   PLT 151 10/23/2020    Recent Labs   Lab 10/28/2020 1207  NA 144  K 4.8  CL 111  CO2 19*  BUN 99*  CREATININE 3.18*  CALCIUM 9.0  PROT 5.6*  BILITOT 0.5  ALKPHOS 108  ALT 159*  AST 179*  GLUCOSE 175*   Lab Results  Component Value Date   CHOL 96 06/05/2020   HDL 57 06/05/2020   LDLCALC 23 06/05/2020   TRIG 81 06/05/2020   Lab Results  Component Value Date   DDIMER 0.41 06/29/2019     Radiology Studies    CT HEAD WO CONTRAST  Result Date: 10/21/2020 CLINICAL DATA:  Suspected stroke. EXAM: CT HEAD WITHOUT CONTRAST TECHNIQUE: Contiguous axial images were obtained from the base of the skull through the vertex without intravenous contrast. COMPARISON:  04/20/2021 FINDINGS: Brain: No evidence of acute infarction, hemorrhage, hydrocephalus, extra-axial collection or mass lesion/mass effect. Moderate brain parenchymal volume loss and deep white matter microangiopathy. 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 Vascular: Calcific atherosclerotic disease of the intra cavernous carotid arteries. Skull: Normal. Negative for fracture or focal lesion. Sinuses/Orbits: No acute finding. Other: None. IMPRESSION: 1. No acute intracranial abnormality. 2. Atrophy, chronic microvascular disease. Electronically Signed   By: Fidela Salisbury M.D.   On: 11/11/2020 14:26   DG Chest Port 1 View  Result Date: 11/09/2020 CLINICAL DATA:  80 year old male with central line placement. EXAM: PORTABLE CHEST 1 VIEW COMPARISON:  Earlier chest radiograph dated 11/11/2020 and CT dated 10/30/2020 FINDINGS: Interval placement of a right IJ central venous line with tip close to the cavoatrial junction or over the right atrium. Endotracheal tube approximately 2 cm above the carina. The tube can be pulled back by 2 cm for optimal positioning. Enteric tube with tip in the left upper abdomen likely in the gastric fundus. Shallow inspiration with bilateral streaky densities similar to prior radiograph. No large pleural  effusion or pneumothorax. Stable cardiomegaly. Atherosclerotic calcification of the aorta. No acute osseous pathology. IMPRESSION: 1. Interval placement of a right IJ central venous line with tip close to the cavoatrial junction. No pneumothorax. 2. Endotracheal  tube above the carina. The tube can be pulled back by 2 cm for optimal positioning. 3. No interval change in bilateral pulmonary opacities. Electronically Signed   By: Anner Crete M.D.   On: 11/06/2020 15:27   DG Chest Portable 1 View  Result Date: 11/14/2020 CLINICAL DATA:  80 year old male status post intubation. EXAM: PORTABLE CHEST 1 VIEW COMPARISON:  Chest x-ray 06/28/2020. FINDINGS: An endotracheal tube is in place with tip 2.5 cm above the carina. A nasogastric tube is seen extending into the stomach, however, the tip of the nasogastric tube extends below the lower margin of the image. Transcutaneous defibrillator pads project over the lower left hemithorax. Lung volumes are low. Cephalization of the pulmonary vasculature. Airspace consolidation in the right upper lobe and periphery of the right lung base. Left lung otherwise appears clear. No definite pleural effusions. No pneumothorax. Heart size is mildly enlarged. Upper mediastinal contours are distorted by patient positioning. Aortic atherosclerosis. IMPRESSION: 1. Support apparatus, as above. 2. Right upper lobe airspace consolidation and additional consolidative changes in the right lung base concerning for multilobar pneumonia. 3. Cardiomegaly with pulmonary venous congestion. 4. Aortic atherosclerosis. Electronically Signed   By: Vinnie Langton M.D.   On: 10/26/2020 12:29   DG Abd Portable 1 View  Result Date: 10/25/2020 CLINICAL DATA:  80 year old male status post intubation. EXAM: PORTABLE ABDOMEN - 1 VIEW COMPARISON:  No priors. FINDINGS: Nasogastric tube in the mid stomach. IVC filter projecting over the right mid abdomen. Gas and stool are seen scattered throughout the  colon extending to the level of the distal rectum. No pathologic distension of small bowel is noted. No gross evidence of pneumoperitoneum. IMPRESSION: 1. Support apparatus, as above. 2. Nonobstructive bowel gas pattern. Electronically Signed   By: Vinnie Langton M.D.   On: 10/26/2020 12:31   ECHOCARDIOGRAM COMPLETE  Result Date: 11/05/2020    ECHOCARDIOGRAM REPORT   Patient Name:   [facility]. Date of Exam: 11/17/2020 Medical Rec #:  [id number]          Height:       70.5 in Accession #:    [id number]         Weight:       314.8 lb Date of Birth:  [birth year]           BSA:          2.545 m Patient Age:    72 years           BP:           134/69 mmHg Patient Gender: M                  HR:           92 bpm. Exam Location:  [facility] Procedure: 2D Echo and Intracardiac Opacification Agent Indications:     Cardiac arrest I46.9  History:         Patient has no prior history of Echocardiogram examinations.  Sonographer:     Kathlen Brunswick RDCS Referring Phys:  [id number] Ottie Glazier Diagnosing Phys: Ida Rogue MD  Sonographer Comments: Technically challenging study due to limited acoustic windows, suboptimal subcostal window and echo performed with patient supine and on artificial respirator. Image acquisition challenging due to respiratory motion and Image acquisition challenging due to patient body habitus. IMPRESSIONS  1. Left ventricular ejection fraction, by estimation, is 60 to 65%. The left ventricle has normal function. The left ventricle has no regional wall motion abnormalities. There is  moderate left ventricular hypertrophy. Left ventricular diastolic parameters are consistent with Grade I diastolic dysfunction (impaired relaxation).  2. Right ventricular systolic function is normal. The right ventricular size is normal. Tricuspid regurgitation signal is inadequate for assessing PA pressure.  3. The mitral valve is normal in structure. No evidence of mitral valve regurgitation. No evidence of mitral  stenosis. FINDINGS  Left Ventricle: Left ventricular ejection fraction, by estimation, is 60 to 65%. The left ventricle has normal function. The left ventricle has no regional wall motion abnormalities. Definity contrast agent was given IV to delineate the left ventricular  endocardial borders. The left ventricular internal cavity size was normal in size. There is moderate left ventricular hypertrophy. Left ventricular diastolic parameters are consistent with Grade I diastolic dysfunction (impaired relaxation). Right Ventricle: The right ventricular size is normal. No increase in right ventricular wall thickness. Right ventricular systolic function is normal. Tricuspid regurgitation signal is inadequate for assessing PA pressure. Left Atrium: Left atrial size was normal in size. Right Atrium: Right atrial size was normal in size. Pericardium: There is no evidence of pericardial effusion. Mitral Valve: The mitral valve is normal in structure. No evidence of mitral valve regurgitation. No evidence of mitral valve stenosis. Tricuspid Valve: The tricuspid valve is normal in structure. Tricuspid valve regurgitation is mild . No evidence of tricuspid stenosis. Aortic Valve: The aortic valve is normal in structure. Aortic valve regurgitation is not visualized. No aortic stenosis is present. Aortic valve mean gradient measures 7.0 mmHg. Aortic valve peak gradient measures 14.7 mmHg. Pulmonic Valve: The pulmonic valve was normal in structure. Pulmonic valve regurgitation is not visualized. No evidence of pulmonic stenosis. Aorta: The aortic root is normal in size and structure. Venous: The inferior vena cava is normal in size with greater than 50% respiratory variability, suggesting right atrial pressure of 3 mmHg. IAS/Shunts: No atrial level shunt detected by color flow Doppler.  LEFT VENTRICLE PLAX 2D LVIDd:         3.66 cm Diastology LVIDs:         2.60 cm LV e' medial:    9.03 cm/s LV PW:         1.34 cm LV E/e' medial:   7.6 LV IVS:        1.30 cm LV e' lateral:   11.30 cm/s                        LV E/e' lateral: 6.1  RIGHT VENTRICLE RV S prime:     13.75 cm/s TAPSE (M-mode): 3.7 cm LEFT ATRIUM           Index       RIGHT ATRIUM           Index LA diam:      4.00 cm 1.57 cm/m  RA Area:     13.10 cm LA Vol (A4C): 52.7 ml 20.71 ml/m RA Volume:   33.30 ml  13.09 ml/m  AORTIC VALVE AV Vmax:           191.50 cm/s AV Vmean:          121.000 cm/s AV VTI:            0.317 m AV Peak Grad:      14.7 mmHg AV Mean Grad:      7.0 mmHg LVOT Vmax:         131.00 cm/s LVOT Vmean:        83.700 cm/s LVOT VTI:  0.249 m LVOT/AV VTI ratio: 0.79 MITRAL VALVE               TRICUSPID VALVE MV Area (PHT): 3.68 cm    TV Peak grad:   29.1 mmHg MV Decel Time: 206 msec    TV Vmax:        2.70 m/s MV E velocity: 68.40 cm/s MV A velocity: 98.50 cm/s  SHUNTS MV E/A ratio:  0.69        Systemic VTI: 0.25 m Ida Rogue MD Electronically signed by Ida Rogue MD Signature Date/Time: 11/20/2020/4:34:13 PM    Final    CT CHEST ABDOMEN PELVIS WO CONTRAST  Result Date: 10/28/2020 CLINICAL DATA:  Pt arrived ems from home. Pt was at home and collapsed and CPR was started. 18 mins of CPR. 3 epi, 2 bicarb, 1 calcium. ROSC achieved. While en route, pt lost pulses again but after 2 round of CPR ROSC was obtained again with history of diabetes, peripheral vascular disease with nonhealing ulcers on the right lower extremity, hypertension, congestive heart failure, BPH, history of DVT, history of COVID infection, dementia and chronic kidney disease stage IIIb with a baseline creatinine of about 2.4 as of 2 weeks ago. Patient sees Dr. Murlean Iba in the office. He was last seen in last month.As per the family while sitting in the car he became on responsive. The EMT came and did CPR. Patient was unresponsive for about 20 minutes finally he regained pulse. Patient is now intubated and sedated EXAM: CT CHEST, ABDOMEN AND PELVIS WITHOUT CONTRAST TECHNIQUE:  Multidetector CT imaging of the chest, abdomen and pelvis was performed following the standard protocol without IV contrast. Examlimited by lack of intravenous and oral contrast and due to artifact from the arms being at patient's side. COMPARISON:  07/21/2020. FINDINGS: CT CHEST FINDINGS Cardiovascular: Heart is normal in size and configuration. No pericardial effusion. Great vessels normal in caliber. Mild aortic atherosclerosis. Mediastinum/Nodes: Endotracheal tube tip projects at the upper level of the carina. Nasal/orogastric tube passes below the diaphragm, well within the stomach. No neck base, axillary, mediastinal or hilar masses or enlarged lymph nodes. Trachea and esophagus are grossly unremarkable. Lungs/Pleura: Small pleural effusions. Bilateral patchy areas of ground-glass and more confluent opacity, most evident in the right upper lobe. Additional opacity is noted dependently in the lower lobes consistent with atelectasis. Mild interstitial thickening bilaterally. No mass. No suspicious nodule. No pneumothorax. Musculoskeletal: Subtle nondisplaced fractures of the anterior right 7 anterior left sixth ribs. No other fractures. No bone lesions. CT ABDOMEN PELVIS FINDINGS Hepatobiliary: Limited assessment of the from the liver and gallbladder due to artifact arms. Liver normal in size. No convincing mass. Gallbladder unremarkable. No evidence duct dilation. Pancreas: Assessment also somewhat limited by artifact from the arms. No mass or inflammation. Spleen: Normal in size without focal abnormality. Adrenals/Urinary Tract: No adrenal masses. Kidneys normal in orientation and position. Areas of renal cortical thinning, likely scarring. No convincing renal mass, no stone and no hydronephrosis. Ureters not well visualized, but no evidence of a ureteral stone or ureteral dilation. Bladder is decompressed with a Foley catheter. Stomach/Bowel: Stomach is unremarkable. Small bowel and colon are normal in  caliber. No wall thickening. No inflammation. Scattered left colon diverticula. Mild increase in the colonic stool burden. No evidence of appendicitis. Vascular/Lymphatic: Stable vena cava filter. Single tiny focus of aortic atherosclerotic calcification. No aneurysm. No enlarged lymph nodes. Reproductive: Unremarkable. Other: No abdominal wall hernia.  No ascites. Musculoskeletal: No fracture or acute  finding.  No bone lesion. IMPRESSION: CT CHEST 1. Patchy bilateral ground-glass and confluent airspace opacities, most evident in the right upper lobe, which could be the result CPR, but is more suggestive of multifocal pneumonia. 2. Nondisplaced fractures of the anterior right seventh and anterior left sixth ribs presumably occurring during CPR. 3. Dependent opacity in both lower lobes consistent with atelectasis. Small effusions. ABDOMEN AND PELVIS CT 1. No acute findings within the abdomen or pelvis. Electronically Signed   By: Lajean Manes M.D.   On: 10/21/2020 14:30    ECG & Cardiac Imaging   ECG's from EMS: sinus rhythm w/ inferolateral ST dep/TWI   5/29 1202: RSR, 87, mild inf and antlat ST dep - personally reviewed.  Assessment & Plan    1.  PEA Arrest:  Pt w/ reportedly witnessed to become unresponsive today.  Found to be pulseless by EMS/PEA arrest  ACLS w/ ROSC after ~ 20 mins followed by recurrent PEA  ACLS  ROSC.  Pt intubated in ED.  CXR notable for bilat airspace dzs.  Head CT neg.  H/H 2.5/7.9, down from 9.5/30.5 on 08/08/2020 ([facility]).  Initial HsTrop 30, now 119.  ECG from EMS w/ inferolateral ST dep and TWI.  Here, mild inferior and anterolateral ST depression.  Echo w/ nl EF.  Etiology of arrest unclear @ this point, though anemia is profound and hypoxic/resp arrest in the setting of low O2 carrying capacity is likely.  We will follow at a distance and consider additional cardiac eval pending recovery.  Neuro to see.  2.  Microcytic anemia:  H/o anemia but acutely and profoundly worsened  @ 2.5/7.9.  Transfusions pending. Further w/u per CCM/Heme.  Xarelto on hold.  3.  HFpEF:  Body habitus makes exam challenging.  Follow closely.  4.  H/o DVT:  xarelto on hold.  5.  Hypothermia:  Warming per CCM.  Signed, Murray Hodgkins, NP 10/25/2020, 4:39 PM  For questions or updates, please contact   Please consult [email or web address] for contact info under Cardiology/STEMI.

## 2020-11-18 NOTE — Code Documentation (Addendum)
Pt arrived ems from home. Pt was at home and collapsed and CPR was started. 18 mins of CPR. 3 epi, 2 bicarb, 1 calcium. ROSC achieved. While en route, pt lost pulses again but after 2 round of CPR ROSC was obtained again. Dr. Charna Archer, MD at bedside. On arrival, pt has a pulse.   PMH of DM, CKD, and A fib.

## 2020-11-18 NOTE — Consult Note (Signed)
[facility] CONSULT NOTE    Date: 11/05/2020                  Patient Name:  Justin [facility].  MRN: [id number]  DOB: September 02, 1940  Age / Sex: 80 y.o., male         PCP: [name], DO                 Service Requesting Consult: ICU                  Reason for Consult: Acute kidney injury             History of Present Illness: History obtained from the family at bedside.  80 year old male with history of diabetes, peripheral vascular disease with nonhealing ulcers on the right lower extremity, hypertension, congestive heart failure, BPH, history of DVT, history of COVID infection, dementia and chronic kidney disease stage IIIb with a baseline creatinine of about 2.4 as of 2 weeks ago.  Patient sees Dr. Murlean Iba in the office.  He was last seen in last month.  As per the family while sitting in the car he became on responsive.  The EMT came and did CPR.  Patient was unresponsive for about 20 minutes finally he regained pulse.  Patient is now intubated and sedated.  He had a Foley catheter placed with a urine output of about 50 cc.  Patient has been on torsemide. He was recently started on fluconazole 200 mg daily for fungal infection of the groin. There is no history of nonsteroidal anti-inflammatory drug use.   Current medications: Current Facility-Administered Medications  Medication Dose Route Frequency Provider Last Rate Last Admin  . azithromycin (ZITHROMAX) 500 mg in sodium chloride 0.9 % 250 mL IVPB  500 mg Intravenous Once Blake Divine, MD      . dexmedetomidine (PRECEDEX) 400 MCG/100ML (4 mcg/mL) infusion  0-1.2 mcg/kg/hr Intravenous Continuous Blake Divine, MD 13.93 mL/hr at 11/02/2020 1218 0.4 mcg/kg/hr at 11/16/2020 1218  . docusate sodium (COLACE) capsule 100 mg  100 mg Oral BID PRN Ottie Glazier, MD      . fentaNYL (SUBLIMAZE) injection 50 mcg  50 mcg Intravenous Q15 min PRN Blake Divine, MD      . fentaNYL (SUBLIMAZE)  injection 50 mcg  50 mcg Intravenous Q2H PRN Blake Divine, MD      . midazolam (VERSED) injection 1 mg  1 mg Intravenous Q15 min PRN Blake Divine, MD      . midazolam (VERSED) injection 1 mg  1 mg Intravenous Q2H PRN Blake Divine, MD      . piperacillin-tazobactam (ZOSYN) IVPB 3.375 g  3.375 g Intravenous Once Blake Divine, MD 100 mL/hr at 10/26/2020 1255 3.375 g at 10/31/2020 1255  . polyethylene glycol (MIRALAX / GLYCOLAX) packet 17 g  17 g Oral Daily PRN Ottie Glazier, MD      . vancomycin (VANCOREADY) IVPB 2000 mg/400 mL  2,000 mg Intravenous Once Dallie Piles, [facility]       Current Outpatient Medications  Medication Sig Dispense Refill  . albuterol (VENTOLIN HFA) 108 (90 Base) MCG/ACT inhaler Inhale 2 puffs into the lungs every 4 (four) hours as needed for wheezing.    Marland Kitchen allopurinol (ZYLOPRIM) 100 MG tablet Take 100 mg by mouth daily.    Marland Kitchen atorvastatin (LIPITOR) 20 MG tablet Take 1 tablet (20 mg total) by mouth at bedtime. 90 tablet 3  . docusate sodium (COLACE) 100 MG capsule Take  100 mg by mouth daily as needed for mild constipation or moderate constipation.     . fexofenadine (ALLEGRA) 180 MG tablet Take 180 mg by mouth daily as needed for allergies.     . finasteride (PROSCAR) 5 MG tablet Take 1 tablet (5 mg total) by mouth daily. 30 tablet 11  . fluconazole (DIFLUCAN) 200 MG tablet Take one tab po QD on Monday, Wednesday, and Friday 12 tablet 1  . fluticasone (FLONASE) 50 MCG/ACT nasal spray Place 2 sprays into both nostrils daily as needed for allergies.     Marland Kitchen ketoconazole (NIZORAL) 2 % cream Apply to aa's groin QD. 60 g 2  . levETIRAcetam (KEPPRA) 500 MG tablet Take 500 mg by mouth 2 (two) times daily.     Marland Kitchen LORazepam (ATIVAN) 0.5 MG tablet Take 0.5 mg by mouth every 4 (four) hours as needed.    . Magnesium 250 MG TABS Take 250 mg by mouth daily.    . metoprolol succinate (TOPROL-XL) 25 MG 24 hr tablet Take 1 tablet (25 mg total) by mouth daily. 30 tablet 6  . Multiple  Vitamin (ONE-A-DAY MENS PO) Take 1 tablet by mouth daily.     . rivaroxaban (XARELTO) 10 MG TABS tablet TAKE ONE TABLET BY MOUTH EVERY DAY WITH BREAKFAST 90 tablet 3  . torsemide (DEMADEX) 20 MG tablet Take 1 tablet (20 mg total) by mouth daily. Take 1 tab by mouth every Mon/Wed/Fri/Sat (4 days a week) 30 tablet 3      Allergies: Allergies  Allergen Reactions  . Shellfish Allergy Anaphylaxis and Other (See Comments)    gout  . Iodinated Diagnostic Agents Other (See Comments)    Chronic kidney disease 3b  . Nsaids Other (See Comments)    Chronic kidney disease 3b  . Rivastigmine Other (See Comments)    Side effect and not a true allergy      Past Medical History: Past Medical History:  Diagnosis Date  . Arthritis   . Bilateral swelling of feet   . BPH (benign prostatic hyperplasia)   . Chronic kidney disease   . DVT (deep venous thrombosis) (HCC)    right lower extremity  . History of gout   . Seizures ([place])   . Sleep apnea   . Tendonitis of foot    left     Past Surgical History: Past Surgical History:  Procedure Laterality Date  . APPENDECTOMY    . APPLICATION OF WOUND VAC Right 11/02/2019   Procedure: APPLICATION OF WOUND VAC;  Surgeon: Dereck Leep, MD;  Location: [facility] ORS;  Service: Orthopedics;  Laterality: Right;  [id number]  . HERNIA REPAIR     UMBILICAL  . IVC FILTER INSERTION N/A 10/25/2019   Procedure: IVC FILTER INSERTION;  Surgeon: Katha Cabal, MD;  Location: [place] [facility];  Service: Cardiovascular;  Laterality: N/A;  . KNEE ARTHROPLASTY Right 11/02/2019   Procedure: COMPUTER ASSISTED TOTAL KNEE ARTHROPLASTY;  Surgeon: Dereck Leep, MD;  Location: [facility] ORS;  Service: Orthopedics;  Laterality: Right;  . PATELLAR TENDON REPAIR Right 12/05/2019   Procedure: PATELLA TENDON REPAIR;  Surgeon: Dereck Leep, MD;  Location: [facility] ORS;  Service: Orthopedics;  Laterality: Right;  . PROSTATE SURGERY       Family History: Family History  Problem  Relation Age of Onset  . Arthritis Mother   . Hypertension Mother   . Diabetes Brother      Social History: Social History   Socioeconomic History  . Marital status:  Married    Spouse name: Not on file  . Number of children: Not on file  . Years of education: Not on file  . Highest education level: Not on file  Occupational History  . Not on file  Tobacco Use  . Smoking status: Former Smoker    Packs/day: 1.00    Years: 10.00    Pack years: 10.00    Types: Cigarettes    Quit date: 06/23/1968    Years since quitting: 52.4  . Smokeless tobacco: Never Used  Vaping Use  . Vaping Use: Never used  Substance and Sexual Activity  . Alcohol use: No  . Drug use: No  . Sexual activity: Not on file  Other Topics Concern  . Not on file  Social History Narrative  . Not on file   Social Determinants of Health   Financial Resource Strain: Low Risk   . Difficulty of Paying Living Expenses: Not hard at all  Food Insecurity: No Food Insecurity  . Worried About Charity fundraiser in the Last Year: Never true  . Ran Out of Food in the Last Year: Never true  Transportation Needs: No Transportation Needs  . Lack of Transportation (Medical): No  . Lack of Transportation (Non-Medical): No  Physical Activity: Inactive  . Days of Exercise per Week: 0 days  . Minutes of Exercise per Session: 0 min  Stress: No Stress Concern Present  . Feeling of Stress : Only a little  Social Connections: Moderately Isolated  . Frequency of Communication with Friends and Family: More than three times a week  . Frequency of Social Gatherings with Friends and Family: More than three times a week  . Attends Religious Services: Never  . Active Member of Clubs or Organizations: No  . Attends Archivist Meetings: Not on file  . Marital Status: Married  Human resources officer Violence: Not At Risk  . Fear of Current or Ex-Partner: No  . Emotionally Abused: No  . Physically Abused: No  . Sexually  Abused: No     Review of Systems: As per HPI  Vital Signs: Blood pressure 105/60, pulse 96, temperature (!) 94.7 F (34.8 C), resp. rate (!) 24, SpO2 100 %.  Weight trends: There were no vitals filed for this visit.  Physical Exam: Physical Exam: General:  No acute distress  Head:  Normocephalic, atraumatic. Moist oral mucosal membranes  Eyes:  Anicteric  Neck:  Supple  Lungs:   Clear to auscultation, normal effort  Heart:  S1S2 no rubs  Abdomen:   Soft, nontender, bowel sounds present  Extremities:  peripheral edema.  Neurologic:  Unresponsive on the vent  Skin:  No lesions  Access:     Lab results: Basic Metabolic Panel: Recent Labs  Lab 11/17/2020 1207  NA 144  K 4.8  CL 111  CO2 19*  GLUCOSE 175*  BUN 99*  CREATININE 3.18*  CALCIUM 9.0  MG 2.6*    Liver Function Tests: Recent Labs  Lab 11/16/2020 1207  AST 179*  ALT 159*  ALKPHOS 108  BILITOT 0.5  PROT 5.6*  ALBUMIN 2.8*   No results for input(s): LIPASE, AMYLASE in the last 168 hours. No results for input(s): AMMONIA in the last 168 hours.  CBC: No results for input(s): WBC, NEUTROABS, HGB, HCT, MCV, PLT in the last 168 hours.  Cardiac Enzymes: No results for input(s): CKTOTAL, CKMB, CKMBINDEX, TROPONINI in the last 168 hours.  BNP: Invalid input(s): POCBNP  CBG: No results for  input(s): GLUCAP in the last 168 hours.  Microbiology: Results for orders placed or performed during the [facility] encounter of 10/29/2020  Resp Panel by RT-PCR (Flu A&B, Covid) Nasopharyngeal Swab     Status: None   Collection Time: 11/19/2020 12:07 PM   Specimen: Nasopharyngeal Swab; Nasopharyngeal(NP) swabs in vial transport medium  Result Value Ref Range Status   SARS Coronavirus 2 by RT PCR NEGATIVE NEGATIVE Final    Comment: (NOTE) SARS-CoV-2 target nucleic acids are NOT DETECTED.  The SARS-CoV-2 RNA is generally detectable in upper respiratory specimens during the acute phase of infection. The  lowest concentration of SARS-CoV-2 viral copies this assay can detect is 138 copies/mL. A negative result does not preclude SARS-Cov-2 infection and should not be used as the sole basis for treatment or other patient management decisions. A negative result may occur with  improper specimen collection/handling, submission of specimen other than nasopharyngeal swab, presence of viral mutation(s) within the areas targeted by this assay, and inadequate number of viral copies(<138 copies/mL). A negative result must be combined with clinical observations, patient history, and epidemiological information. The expected result is Negative.  Fact Sheet for Patients:  EntrepreneurPulse.com.au  Fact Sheet for Healthcare Providers:  IncredibleEmployment.be  This test is no t yet approved or cleared by the [place] FDA and  has been authorized for detection and/or diagnosis of SARS-CoV-2 by FDA under an Emergency Use Authorization (EUA). This EUA will remain  in effect (meaning this test can be used) for the duration of the COVID-19 declaration under Section 564(b)(1) of the Act, 21 U.S.C.section 360bbb-3(b)(1), unless the authorization is terminated  or revoked sooner.       Influenza A by PCR NEGATIVE NEGATIVE Final   Influenza B by PCR NEGATIVE NEGATIVE Final    Comment: (NOTE) The Xpert Xpress SARS-CoV-2/FLU/RSV plus assay is intended as an aid in the diagnosis of influenza from Nasopharyngeal swab specimens and should not be used as a sole basis for treatment. Nasal washings and aspirates are unacceptable for Xpert Xpress SARS-CoV-2/FLU/RSV testing.  Fact Sheet for Patients: EntrepreneurPulse.com.au  Fact Sheet for Healthcare Providers: IncredibleEmployment.be  This test is not yet approved or cleared by the [place] FDA and has been authorized for detection and/or diagnosis of SARS-CoV-2 by FDA under  an Emergency Use Authorization (EUA). This EUA will remain in effect (meaning this test can be used) for the duration of the COVID-19 declaration under Section 564(b)(1) of the Act, 21 U.S.C. section 360bbb-3(b)(1), unless the authorization is terminated or revoked.  Performed at [facility], [address] [place], [place] [address]     Coagulation Studies: No results for input(s): LABPROT, INR in the last 72 hours.  Urinalysis: No results for input(s): COLORURINE, LABSPEC, PHURINE, GLUCOSEU, HGBUR, BILIRUBINUR, KETONESUR, PROTEINUR, UROBILINOGEN, NITRITE, LEUKOCYTESUR in the last 72 hours.  Invalid input(s): APPERANCEUR    Imaging: DG Chest Portable 1 View  Result Date: 10/24/2020 CLINICAL DATA:  80 year old male status post intubation. EXAM: PORTABLE CHEST 1 VIEW COMPARISON:  Chest x-ray 06/28/2020. FINDINGS: An endotracheal tube is in place with tip 2.5 cm above the carina. A nasogastric tube is seen extending into the stomach, however, the tip of the nasogastric tube extends below the lower margin of the image. Transcutaneous defibrillator pads project over the lower left hemithorax. Lung volumes are low. Cephalization of the pulmonary vasculature. Airspace consolidation in the right upper lobe and periphery of the right lung base. Left lung otherwise appears clear. No definite pleural effusions. No pneumothorax.  Heart size is mildly enlarged. Upper mediastinal contours are distorted by patient positioning. Aortic atherosclerosis. IMPRESSION: 1. Support apparatus, as above. 2. Right upper lobe airspace consolidation and additional consolidative changes in the right lung base concerning for multilobar pneumonia. 3. Cardiomegaly with pulmonary venous congestion. 4. Aortic atherosclerosis. Electronically Signed   By: Vinnie Langton M.D.   On: 11/13/2020 12:29   DG Abd Portable 1 View  Result Date: 11/09/2020 CLINICAL DATA:  80 year old male status post intubation. EXAM:  PORTABLE ABDOMEN - 1 VIEW COMPARISON:  No priors. FINDINGS: Nasogastric tube in the mid stomach. IVC filter projecting over the right mid abdomen. Gas and stool are seen scattered throughout the colon extending to the level of the distal rectum. No pathologic distension of small bowel is noted. No gross evidence of pneumoperitoneum. IMPRESSION: 1. Support apparatus, as above. 2. Nonobstructive bowel gas pattern. Electronically Signed   By: Vinnie Langton M.D.   On: 11/19/2020 12:31     Assessment & Plan:   Active Problems:   Cardiac arrest [facility])  80 year old male with history of diabetes, peripheral vascular disease with nonhealing ulcers on the right lower extremity, hypertension, congestive heart failure, BPH, history of DVT, history of COVID infection, dementia and chronic kidney disease stage IIIb with a baseline creatinine of about 2.4 as of 2 weeks ago.  #1 cardiopulmonary arrest: Patient is now intubated and sedated.  #2 acute kidney injury on the top of chronic kidney disease.  Acute kidney injury is most likely due to acute tubular necrosis due to ischemia secondary to cardiac arrest.  We will maintain the Foley catheter.  #3 anemia: Hemoglobin need to be repeated.  #4 pneumonia: We will continue the antibiotics as ordered.  #5 congestive heart failure: We will use diuretics judiciously as needed.  Overall prognosis is poor.  I spoke to entire family at bedside in the emergency room.  Case also discussed with ICU and the ER team.  Family is agreeable for renal replacement therapy if need be. I will continue to follow the patient closely during the hospitalization.   LOS: 0 Lyla Son, MD [facility]. 5/29/20221:18 PM

## 2020-11-18 NOTE — Code Documentation (Signed)
X-Ray at bedside.

## 2020-11-18 NOTE — Progress Notes (Signed)
Pharmacy Antibiotic Note  Justin Shannon. is a 80 y.o. male with a hx of CKD, HFpEF, BPH, hypertension, dementia, DVT on Xarelto admitted on 11/03/2020 s/p cardiac arrest.  Received ACLS 20 minutes with ROSC.  Lost pulse again and required ACLS second time followed by ROSC required endotracheal intubation in the ER.  Pharmacy has been consulted for vancomycin and Zosyn dosing. His SCr is well above his apparent baseline level.  Plan:  1) start Zosyn 3.375g IV q8h (4 hour infusion)  Follow renal function for potential dose adjestments  2) start vancomycin 2000 mg IV x 1   Ke: 0.021 h-1  T1/2: 33.3 h  Recheck renal function in am to assess maintenance dose  F/U MRSA PCR for potential narrowing of therapy    Temp (24hrs), Avg:94.1 F (34.5 C), Min:93 F (33.9 C), Max:94.7 F (34.8 C)  Recent Labs  Lab 10/30/2020 1207  CREATININE 3.18*    Estimated Creatinine Clearance: 27.1 mL/min (A) (by C-G formula based on SCr of 3.18 mg/dL (H)).    Allergies  Allergen Reactions  . Shellfish Allergy Anaphylaxis and Other (See Comments)    gout  . Iodinated Diagnostic Agents Other (See Comments)    Chronic kidney disease 3b  . Nsaids Other (See Comments)    Chronic kidney disease 3b  . Rivastigmine Other (See Comments)    Side effect and not a true allergy    Antimicrobials this admission: vancomycin 5/29 >>  Zosyn 5/29 >>   Microbiology results: 5/29 BCx: pending 5/29 UCx: pending 5/29 SARS CoV-2: negative 5/29 influenza A/B: negative  5/29 Sputum: pending  5/29 MRSA PCR: pending  Thank you for allowing pharmacy to be a part of this patient's care.  Dallie Piles 11/02/2020 1:14 PM

## 2020-11-19 DIAGNOSIS — I469 Cardiac arrest, cause unspecified: Secondary | ICD-10-CM | POA: Diagnosis not present

## 2020-11-19 DIAGNOSIS — I129 Hypertensive chronic kidney disease with stage 1 through stage 4 chronic kidney disease, or unspecified chronic kidney disease: Secondary | ICD-10-CM

## 2020-11-19 DIAGNOSIS — D649 Anemia, unspecified: Secondary | ICD-10-CM

## 2020-11-19 DIAGNOSIS — D631 Anemia in chronic kidney disease: Secondary | ICD-10-CM | POA: Diagnosis not present

## 2020-11-19 DIAGNOSIS — I468 Cardiac arrest due to other underlying condition: Secondary | ICD-10-CM

## 2020-11-19 DIAGNOSIS — R092 Respiratory arrest: Secondary | ICD-10-CM

## 2020-11-19 DIAGNOSIS — N184 Chronic kidney disease, stage 4 (severe): Secondary | ICD-10-CM | POA: Diagnosis not present

## 2020-11-19 LAB — CBC
HCT: 18.9 % — ABNORMAL LOW (ref 39.0–52.0)
Hemoglobin: 6.5 g/dL — ABNORMAL LOW (ref 13.0–17.0)
MCH: 27.5 pg (ref 26.0–34.0)
MCHC: 34.4 g/dL (ref 30.0–36.0)
MCV: 80.1 fL (ref 80.0–100.0)
Platelets: 108 10*3/uL — ABNORMAL LOW (ref 150–400)
RBC: 2.36 MIL/uL — ABNORMAL LOW (ref 4.22–5.81)
RDW: 22.4 % — ABNORMAL HIGH (ref 11.5–15.5)
WBC: 11.4 10*3/uL — ABNORMAL HIGH (ref 4.0–10.5)
nRBC: 1.3 % — ABNORMAL HIGH (ref 0.0–0.2)

## 2020-11-19 LAB — BASIC METABOLIC PANEL
Anion gap: 8 (ref 5–15)
Anion gap: 9 (ref 5–15)
BUN: 112 mg/dL — ABNORMAL HIGH (ref 8–23)
BUN: 105 mg/dL — ABNORMAL HIGH (ref 8–23)
CO2: 22 mmol/L (ref 22–32)
CO2: 24 mmol/L (ref 22–32)
Calcium: 8 mg/dL — ABNORMAL LOW (ref 8.9–10.3)
Calcium: 8.1 mg/dL — ABNORMAL LOW (ref 8.9–10.3)
Chloride: 115 mmol/L — ABNORMAL HIGH (ref 98–111)
Chloride: 112 mmol/L — ABNORMAL HIGH (ref 98–111)
Creatinine, Ser: 3.6 mg/dL — ABNORMAL HIGH (ref 0.61–1.24)
Creatinine, Ser: 3.75 mg/dL — ABNORMAL HIGH (ref 0.61–1.24)
GFR, Estimated: 17 mL/min — ABNORMAL LOW (ref >60)
GFR, Estimated: 16 mL/min — ABNORMAL LOW (ref >60)
Glucose, Bld: 123 mg/dL — ABNORMAL HIGH (ref 70–99)
Glucose, Bld: 100 mg/dL — ABNORMAL HIGH (ref 70–99)
Potassium: 5.2 mmol/L — ABNORMAL HIGH (ref 3.5–5.1)
Potassium: 4.5 mmol/L (ref 3.5–5.1)
Sodium: 145 mmol/L (ref 135–145)
Sodium: 145 mmol/L (ref 135–145)

## 2020-11-19 LAB — FERRITIN: Ferritin: 34 ng/mL (ref 24–336)

## 2020-11-19 LAB — RESPIRATORY PANEL BY PCR

## 2020-11-19 LAB — GLUCOSE, CAPILLARY
Glucose-Capillary: 100 mg/dL — ABNORMAL HIGH (ref 70–99)
Glucose-Capillary: 122 mg/dL — ABNORMAL HIGH (ref 70–99)
Glucose-Capillary: 84 mg/dL (ref 70–99)
Glucose-Capillary: 88 mg/dL (ref 70–99)
Glucose-Capillary: 95 mg/dL (ref 70–99)
Glucose-Capillary: 99 mg/dL (ref 70–99)

## 2020-11-19 LAB — TECHNOLOGIST SMEAR REVIEW

## 2020-11-19 LAB — BLOOD GAS, ARTERIAL
Acid-base deficit: 3 mmol/L — ABNORMAL HIGH (ref 0.0–2.0)
Bicarbonate: 22 mmol/L (ref 20.0–28.0)
FIO2: 0.4
MECHVT: 500 mL
O2 Saturation: 98.3 %
PEEP: 5 cmH2O
Patient temperature: 36
RATE: 16 resp/min
pCO2 arterial: 36 mmHg (ref 32.0–48.0)
pH, Arterial: 7.38 (ref 7.350–7.450)
pO2, Arterial: 108 mmHg (ref 83.0–108.0)

## 2020-11-19 LAB — VITAMIN B12: Vitamin B-12: 1366 pg/mL — ABNORMAL HIGH (ref 180–914)

## 2020-11-19 LAB — MAGNESIUM: Magnesium: 2.4 mg/dL (ref 1.7–2.4)

## 2020-11-19 LAB — HEMOGLOBIN: Hemoglobin: 8.4 g/dL — ABNORMAL LOW (ref 13.0–17.0)

## 2020-11-19 LAB — PHOSPHORUS: Phosphorus: 5.9 mg/dL — ABNORMAL HIGH (ref 2.5–4.6)

## 2020-11-19 LAB — LACTATE DEHYDROGENASE: LDH: 377 U/L — ABNORMAL HIGH (ref 98–192)

## 2020-11-19 LAB — RETICULOCYTES
Immature Retic Fract: 4.3 % (ref 2.3–15.9)
RBC.: 2.78 MIL/uL — ABNORMAL LOW (ref 4.22–5.81)
Retic Count, Absolute: 50.6 10*3/uL (ref 19.0–186.0)
Retic Ct Pct: 1.3 % (ref 0.4–3.1)

## 2020-11-19 LAB — TROPONIN I (HIGH SENSITIVITY)
Troponin I (High Sensitivity): 188 ng/L (ref <18)
Troponin I (High Sensitivity): 135 ng/L (ref <18)

## 2020-11-19 LAB — IRON AND TIBC
Iron: 19 ug/dL — ABNORMAL LOW (ref 45–182)
Saturation Ratios: 5 % — ABNORMAL LOW (ref 17.9–39.5)
TIBC: 386 ug/dL (ref 250–450)
UIBC: 367 ug/dL

## 2020-11-19 LAB — PREPARE RBC (CROSSMATCH)

## 2020-11-19 LAB — FOLATE: Folate: 35 ng/mL (ref >5.9)

## 2020-11-19 MED ORDER — FUROSEMIDE 10 MG/ML IJ SOLN
40.0000 mg | Freq: Once | INTRAMUSCULAR | Status: AC
  Administered 2020-11-19: 40 mg via INTRAVENOUS
  Filled 2020-11-19: qty 4

## 2020-11-19 MED ORDER — POLYETHYLENE GLYCOL 3350 17 G PO PACK: 17.0000 g | PACK | Freq: Every day | ORAL | Status: DC | PRN

## 2020-11-19 MED ORDER — SODIUM ZIRCONIUM CYCLOSILICATE 5 G PO PACK
10.0000 g | PACK | Freq: Three times a day (TID) | ORAL | Status: DC
  Administered 2020-11-19 (×2): 10 g
  Filled 2020-11-19 (×2): qty 2

## 2020-11-19 MED ORDER — MIDAZOLAM HCL 50 MG/10ML IJ SOLN
0.5000 mg/h | INTRAMUSCULAR | Status: DC
  Administered 2020-11-19: 2 mg/h via INTRAVENOUS
  Administered 2020-11-19 – 2020-11-20 (×2): 4 mg/h via INTRAVENOUS
  Administered 2020-11-21: 2 mg/h via INTRAVENOUS
  Filled 2020-11-19 (×5): qty 10

## 2020-11-19 MED ORDER — SODIUM CHLORIDE 0.9% IV SOLUTION: Freq: Once | INTRAVENOUS | Status: AC

## 2020-11-19 MED ORDER — BACITRACIN ZINC 500 UNIT/GM EX OINT
TOPICAL_OINTMENT | Freq: Two times a day (BID) | CUTANEOUS | Status: DC
  Filled 2020-11-19 (×6): qty 0.9

## 2020-11-19 NOTE — Progress Notes (Addendum)
Patient sedated versed/fentanyl. Re-warming phase to begin this evening. Patients on levophed for blood pressure. EEG performed today. Patient received 2 units during my shift. Hemoglobin 8.4. Lasix given, good urine output. Dr. Rockey Situ notified of patients troponin level, at this time no additional orders. Continue to assess. Family at bedside throughout the day.

## 2020-11-19 NOTE — Progress Notes (Signed)
[facility]  PROGRESS NOTE   Subjective:   Patient is now intubated and sedated. Status postcardiopulmonary arrest. Was found to have a hemoglobin of 2.5 and had blood transfusion done. Neuro and GI evaluation in progress.  Objective:  Vital signs in last 24 hours:  Temp:  [93.9 F (34.4 C)-97 F (36.1 C)] 96.8 F (36 C) (05/30 1334) Pulse Rate:  [41-92] 71 (05/30 1334) Resp:  [13-34] 22 (05/30 1334) BP: (62-145)/(47-92) 116/54 (05/30 1334) SpO2:  [91 %-100 %] 100 % (05/30 1350) FiO2 (%):  [28 %-40 %] 28 % (05/30 1350) Weight:  [142.8 kg] 142.8 kg (05/29 1414)  Weight change:  Filed Weights   11/06/2020 1414  Weight: (!) 142.8 kg    Intake/Output: I/O last 3 completed shifts: In: 2879.2 [I.V.:488.3; Blood:1727.5; IV Piggyback:663.4] Out: 1315 [Urine:865; Emesis/NG output:450]   Intake/Output this shift:  Total I/O In: 1070 [Blood:1070] Out: 685 [Urine:685]  Physical Exam: General:  No acute distress  Head:  Normocephalic, atraumatic. Moist oral mucosal membranes  Eyes:  Anicteric  Neck:  Supple  Lungs:   Clear to auscultation, normal effort  Heart:  S1S2 no rubs  Abdomen:   Soft, nontender, bowel sounds present  Extremities:  peripheral edema.  Neurologic:  Poorly responsive  Skin:  No lesions  Access:     Basic Metabolic Panel: Recent Labs  Lab 11/10/2020 1207 11/20/2020 2032 11/19/20 0459  NA 144 144 145  K 4.8 5.6* 5.2*  CL 111 112* 115*  CO2 19* 22 22  GLUCOSE 175* 144* 123*  BUN 99* 106* 112*  CREATININE 3.18* 3.42* 3.60*  CALCIUM 9.0 8.3* 8.0*  MG 2.6* 2.2 2.4  PHOS  --  5.4* 5.9*    Liver Function Tests: Recent Labs  Lab 10/21/2020 1207  AST 179*  ALT 159*  ALKPHOS 108  BILITOT 0.5  PROT 5.6*  ALBUMIN 2.8*   No results for input(s): LIPASE, AMYLASE in the last 168 hours. No results for input(s): AMMONIA in the last 168 hours.  CBC: Recent Labs  Lab 11/17/2020 1241 11/03/2020 1336 11/04/2020 1812 11/19/20 0459  WBC  12.7* 13.5*  --  11.4*  NEUTROABS  --  12.0*  --   --   HGB 2.5* 2.5* 4.5* 6.5*  HCT 8.0* 7.9* 14.1* 18.9*  MCV 67.2* 68.1*  --  80.1  PLT 153 151  --  108*    Cardiac Enzymes: No results for input(s): CKTOTAL, CKMB, CKMBINDEX, TROPONINI in the last 168 hours.  BNP: Invalid input(s): POCBNP  CBG: Recent Labs  Lab 10/28/2020 1952 10/28/2020 2348 11/19/20 0351 11/19/20 0735 11/19/20 1116  GLUCAP 138* 129* 122* 99 100*    Microbiology: Results for orders placed or performed during the hospital encounter of 10/26/2020  Resp Panel by RT-PCR (Flu A&B, Covid) Nasopharyngeal Swab     Status: None   Collection Time: 11/19/2020 12:07 PM   Specimen: Nasopharyngeal Swab; Nasopharyngeal(NP) swabs in vial transport medium  Result Value Ref Range Status   SARS Coronavirus 2 by RT PCR NEGATIVE NEGATIVE Final    Comment: (NOTE) SARS-CoV-2 target nucleic acids are NOT DETECTED.  The SARS-CoV-2 RNA is generally detectable in upper respiratory specimens during the acute phase of infection. The lowest concentration of SARS-CoV-2 viral copies this assay can detect is 138 copies/mL. A negative result does not preclude SARS-Cov-2 infection and should not be used as the sole basis for treatment or other patient management decisions. A negative result may occur with  improper specimen collection/handling, submission  of specimen other than nasopharyngeal swab, presence of viral mutation(s) within the areas targeted by this assay, and inadequate number of viral copies(<138 copies/mL). A negative result must be combined with clinical observations, patient history, and epidemiological information. The expected result is Negative.  Fact Sheet for Patients:  EntrepreneurPulse.com.au  Fact Sheet for Healthcare Providers:  IncredibleEmployment.be  This test is no t yet approved or cleared by the [place] FDA and  has been authorized for detection and/or diagnosis  of SARS-CoV-2 by FDA under an Emergency Use Authorization (EUA). This EUA will remain  in effect (meaning this test can be used) for the duration of the COVID-19 declaration under Section 564(b)(1) of the Act, 21 U.S.C.section 360bbb-3(b)(1), unless the authorization is terminated  or revoked sooner.       Influenza A by PCR NEGATIVE NEGATIVE Final   Influenza B by PCR NEGATIVE NEGATIVE Final    Comment: (NOTE) The Xpert Xpress SARS-CoV-2/FLU/RSV plus assay is intended as an aid in the diagnosis of influenza from Nasopharyngeal swab specimens and should not be used as a sole basis for treatment. Nasal washings and aspirates are unacceptable for Xpert Xpress SARS-CoV-2/FLU/RSV testing.  Fact Sheet for Patients: EntrepreneurPulse.com.au  Fact Sheet for Healthcare Providers: IncredibleEmployment.be  This test is not yet approved or cleared by the [place] FDA and has been authorized for detection and/or diagnosis of SARS-CoV-2 by FDA under an Emergency Use Authorization (EUA). This EUA will remain in effect (meaning this test can be used) for the duration of the COVID-19 declaration under Section 564(b)(1) of the Act, 21 U.S.C. section 360bbb-3(b)(1), unless the authorization is terminated or revoked.  Performed at [facility], [address] [place], [place] [address]   CULTURE, BLOOD (ROUTINE X 2) w Reflex to ID Panel     Status: None (Preliminary result)   Collection Time: 11/16/2020 12:41 PM   Specimen: BLOOD  Result Value Ref Range Status   Specimen Description BLOOD LEFT ASSIST CONTROL  Final   Special Requests   Final    BOTTLES DRAWN AEROBIC AND ANAEROBIC Blood Culture adequate volume   Culture   Final    NO GROWTH < 24 HOURS Performed at [facility], [address] [place], [place] [address]    Report Status PENDING  Incomplete  CULTURE, BLOOD (ROUTINE X 2) w Reflex to ID Panel     Status: None  (Preliminary result)   Collection Time: 11/17/2020 12:42 PM   Specimen: BLOOD  Result Value Ref Range Status   Specimen Description BLOOD RIGHT ASSIST CONTROL  Final   Special Requests   Final    BOTTLES DRAWN AEROBIC AND ANAEROBIC Blood Culture adequate volume   Culture   Final    NO GROWTH < 24 HOURS Performed at [facility], [address] [place], [place] [address]    Report Status PENDING  Incomplete  MRSA PCR Screening     Status: None   Collection Time: 11/16/2020  3:55 PM   Specimen: Nasal Mucosa; Nasopharyngeal  Result Value Ref Range Status   MRSA by PCR NEGATIVE NEGATIVE Final    Comment:        The GeneXpert MRSA Assay (FDA approved for NASAL specimens only), is one component of a comprehensive MRSA colonization surveillance program. It is not intended to diagnose MRSA infection nor to guide or monitor treatment for MRSA infections. Performed at [facility], [address] [place], [place] [address]   Culture, respiratory (tracheal aspirate)  Status: None (Preliminary result)   Collection Time: 10/26/2020  4:30 PM   Specimen: Tracheal Aspirate; Respiratory  Result Value Ref Range Status   Specimen Description   Final    TRACHEAL ASPIRATE Performed at [facility], [address] [place], [place] [address]    Special Requests   Final    NONE Performed at [facility], [address] [place], [place] [address]    Gram Stain   Final    MODERATE WBC PRESENT, PREDOMINANTLY PMN NO ORGANISMS SEEN    Culture   Final    NO GROWTH < 12 HOURS Performed at [facility] [address], [place], [place] [address]    Report Status PENDING  Incomplete  Respiratory (~20 pathogens) panel by PCR     Status: None   Collection Time: 10/24/2020  4:30 PM  Result Value Ref Range Status   Adenovirus NOT DETECTED NOT DETECTED Final   Coronavirus 229E NOT DETECTED NOT DETECTED Final    Comment: (NOTE) The Coronavirus on the  Respiratory Panel, DOES NOT test for the novel  Coronavirus 2017/08/26 nCoV)    Coronavirus HKU1 NOT DETECTED NOT DETECTED Final   Coronavirus NL63 NOT DETECTED NOT DETECTED Final   Coronavirus OC43 NOT DETECTED NOT DETECTED Final   Metapneumovirus NOT DETECTED NOT DETECTED Final   Rhinovirus / Enterovirus NOT DETECTED NOT DETECTED Final   Influenza A NOT DETECTED NOT DETECTED Final   Influenza B NOT DETECTED NOT DETECTED Final   Parainfluenza Virus 1 NOT DETECTED NOT DETECTED Final   Parainfluenza Virus 2 NOT DETECTED NOT DETECTED Final   Parainfluenza Virus 3 NOT DETECTED NOT DETECTED Final   Parainfluenza Virus 4 NOT DETECTED NOT DETECTED Final   Respiratory Syncytial Virus NOT DETECTED NOT DETECTED Final   Bordetella pertussis NOT DETECTED NOT DETECTED Final   Bordetella Parapertussis NOT DETECTED NOT DETECTED Final   Chlamydophila pneumoniae NOT DETECTED NOT DETECTED Final   Mycoplasma pneumoniae NOT DETECTED NOT DETECTED Final    Comment: Performed at [facility], [place]. [address] [place], [place] [address]    Coagulation Studies: Recent Labs    10/27/2020 2030/08/26  LABPROT 26.1*  INR 2.4*    Urinalysis: No results for input(s): COLORURINE, LABSPEC, PHURINE, GLUCOSEU, HGBUR, BILIRUBINUR, KETONESUR, PROTEINUR, UROBILINOGEN, NITRITE, LEUKOCYTESUR in the last 72 hours.  Invalid input(s): APPERANCEUR    Imaging: CT HEAD WO CONTRAST  Result Date: 10/25/2020 CLINICAL DATA:  Suspected stroke. EXAM: CT HEAD WITHOUT CONTRAST TECHNIQUE: Contiguous axial images were obtained from the base of the skull through the vertex without intravenous contrast. COMPARISON:  04/20/2021 FINDINGS: Brain: No evidence of acute infarction, hemorrhage, hydrocephalus, extra-axial collection or mass lesion/mass effect. Moderate brain parenchymal volume loss and deep white matter microangiopathy. 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 Vascular: Calcific atherosclerotic  disease of the intra cavernous carotid arteries. Skull: Normal. Negative for fracture or focal lesion. Sinuses/Orbits: No acute finding. Other: None. IMPRESSION: 1. No acute intracranial abnormality. 2. Atrophy, chronic microvascular disease. Electronically Signed   By: Fidela Salisbury M.D.   On: 10/29/2020 14:26   DG Chest Port 1 View  Result Date: 11/04/2020 CLINICAL DATA:  80 year old male with central line placement. EXAM: PORTABLE CHEST 1 VIEW COMPARISON:  Earlier chest radiograph dated 11/05/2020 and CT dated 11/14/2020 FINDINGS: Interval placement of a right IJ central venous line with tip close to the cavoatrial junction or over the right atrium. Endotracheal tube approximately 2 cm above the  carina. The tube can be pulled back by 2 cm for optimal positioning. Enteric tube with tip in the left upper abdomen likely in the gastric fundus. Shallow inspiration with bilateral streaky densities similar to prior radiograph. No large pleural effusion or pneumothorax. Stable cardiomegaly. Atherosclerotic calcification of the aorta. No acute osseous pathology. IMPRESSION: 1. Interval placement of a right IJ central venous line with tip close to the cavoatrial junction. No pneumothorax. 2. Endotracheal tube above the carina. The tube can be pulled back by 2 cm for optimal positioning. 3. No interval change in bilateral pulmonary opacities. Electronically Signed   By: Anner Crete M.D.   On: 10/25/2020 15:27   DG Chest Portable 1 View  Result Date: 10/22/2020 CLINICAL DATA:  80 year old male status post intubation. EXAM: PORTABLE CHEST 1 VIEW COMPARISON:  Chest x-ray 06/28/2020. FINDINGS: An endotracheal tube is in place with tip 2.5 cm above the carina. A nasogastric tube is seen extending into the stomach, however, the tip of the nasogastric tube extends below the lower margin of the image. Transcutaneous defibrillator pads project over the lower left hemithorax. Lung volumes are low. Cephalization of  the pulmonary vasculature. Airspace consolidation in the right upper lobe and periphery of the right lung base. Left lung otherwise appears clear. No definite pleural effusions. No pneumothorax. Heart size is mildly enlarged. Upper mediastinal contours are distorted by patient positioning. Aortic atherosclerosis. IMPRESSION: 1. Support apparatus, as above. 2. Right upper lobe airspace consolidation and additional consolidative changes in the right lung base concerning for multilobar pneumonia. 3. Cardiomegaly with pulmonary venous congestion. 4. Aortic atherosclerosis. Electronically Signed   By: Vinnie Langton M.D.   On: 10/25/2020 12:29   DG Abd Portable 1 View  Result Date: 11/14/2020 CLINICAL DATA:  80 year old male status post intubation. EXAM: PORTABLE ABDOMEN - 1 VIEW COMPARISON:  No priors. FINDINGS: Nasogastric tube in the mid stomach. IVC filter projecting over the right mid abdomen. Gas and stool are seen scattered throughout the colon extending to the level of the distal rectum. No pathologic distension of small bowel is noted. No gross evidence of pneumoperitoneum. IMPRESSION: 1. Support apparatus, as above. 2. Nonobstructive bowel gas pattern. Electronically Signed   By: Vinnie Langton M.D.   On: 10/24/2020 12:31   ECHOCARDIOGRAM COMPLETE  Result Date: 11/09/2020    ECHOCARDIOGRAM REPORT   Patient Name:   [facility]. Date of Exam: 11/07/2020 Medical Rec #:  [id number]          Height:       70.5 in Accession #:    [id number]         Weight:       314.8 lb Date of Birth:  Oct 14, 1940           BSA:          2.545 m Patient Age:    80 years           BP:           134/69 mmHg Patient Gender: M                  HR:           92 bpm. Exam Location:  [facility] Procedure: 2D Echo and Intracardiac Opacification Agent Indications:     Cardiac arrest I46.9  History:         Patient has no prior history of Echocardiogram examinations.  Sonographer:     Kathlen Brunswick RDCS Referring Phys:  [phone]  Luvenia Heller ALESKEROV Diagnosing Phys: Ida Rogue MD  Sonographer Comments: Technically challenging study due to limited acoustic windows, suboptimal subcostal window and echo performed with patient supine and on artificial respirator. Image acquisition challenging due to respiratory motion and Image acquisition challenging due to patient body habitus. IMPRESSIONS  1. Left ventricular ejection fraction, by estimation, is 60 to 65%. The left ventricle has normal function. The left ventricle has no regional wall motion abnormalities. There is moderate left ventricular hypertrophy. Left ventricular diastolic parameters are consistent with Grade I diastolic dysfunction (impaired relaxation).  2. Right ventricular systolic function is normal. The right ventricular size is normal. Tricuspid regurgitation signal is inadequate for assessing PA pressure.  3. The mitral valve is normal in structure. No evidence of mitral valve regurgitation. No evidence of mitral stenosis. FINDINGS  Left Ventricle: Left ventricular ejection fraction, by estimation, is 60 to 65%. The left ventricle has normal function. The left ventricle has no regional wall motion abnormalities. Definity contrast agent was given IV to delineate the left ventricular  endocardial borders. The left ventricular internal cavity size was normal in size. There is moderate left ventricular hypertrophy. Left ventricular diastolic parameters are consistent with Grade I diastolic dysfunction (impaired relaxation). Right Ventricle: The right ventricular size is normal. No increase in right ventricular wall thickness. Right ventricular systolic function is normal. Tricuspid regurgitation signal is inadequate for assessing PA pressure. Left Atrium: Left atrial size was normal in size. Right Atrium: Right atrial size was normal in size. Pericardium: There is no evidence of pericardial effusion. Mitral Valve: The mitral valve is normal in structure. No evidence of mitral valve  regurgitation. No evidence of mitral valve stenosis. Tricuspid Valve: The tricuspid valve is normal in structure. Tricuspid valve regurgitation is mild . No evidence of tricuspid stenosis. Aortic Valve: The aortic valve is normal in structure. Aortic valve regurgitation is not visualized. No aortic stenosis is present. Aortic valve mean gradient measures 7.0 mmHg. Aortic valve peak gradient measures 14.7 mmHg. Pulmonic Valve: The pulmonic valve was normal in structure. Pulmonic valve regurgitation is not visualized. No evidence of pulmonic stenosis. Aorta: The aortic root is normal in size and structure. Venous: The inferior vena cava is normal in size with greater than 50% respiratory variability, suggesting right atrial pressure of 3 mmHg. IAS/Shunts: No atrial level shunt detected by color flow Doppler.  LEFT VENTRICLE PLAX 2D LVIDd:         3.66 cm Diastology LVIDs:         2.60 cm LV e' medial:    9.03 cm/s LV PW:         1.34 cm LV E/e' medial:  7.6 LV IVS:        1.30 cm LV e' lateral:   11.30 cm/s                        LV E/e' lateral: 6.1  RIGHT VENTRICLE RV S prime:     13.75 cm/s TAPSE (M-mode): 3.7 cm LEFT ATRIUM           Index       RIGHT ATRIUM           Index LA diam:      4.00 cm 1.57 cm/m  RA Area:     13.10 cm LA Vol (A4C): 52.7 ml 20.71 ml/m RA Volume:   33.30 ml  13.09 ml/m  AORTIC VALVE AV Vmax:  191.50 cm/s AV Vmean:          121.000 cm/s AV VTI:            0.317 m AV Peak Grad:      14.7 mmHg AV Mean Grad:      7.0 mmHg LVOT Vmax:         131.00 cm/s LVOT Vmean:        83.700 cm/s LVOT VTI:          0.249 m LVOT/AV VTI ratio: 0.79 MITRAL VALVE               TRICUSPID VALVE MV Area (PHT): 3.68 cm    TV Peak grad:   29.1 mmHg MV Decel Time: 206 msec    TV Vmax:        2.70 m/s MV E velocity: 68.40 cm/s MV A velocity: 98.50 cm/s  SHUNTS MV E/A ratio:  0.69        Systemic VTI: 0.25 m Ida Rogue MD Electronically signed by Ida Rogue MD Signature Date/Time:  10/26/2020/4:34:13 PM    Final    CT CHEST ABDOMEN PELVIS WO CONTRAST  Result Date: 11/17/2020 CLINICAL DATA:  Pt arrived ems from home. Pt was at home and collapsed and CPR was started. 18 mins of CPR. 3 epi, 2 bicarb, 1 calcium. ROSC achieved. While en route, pt lost pulses again but after 2 round of CPR ROSC was obtained again with history of diabetes, peripheral vascular disease with nonhealing ulcers on the right lower extremity, hypertension, congestive heart failure, BPH, history of DVT, history of COVID infection, dementia and chronic kidney disease stage IIIb with a baseline creatinine of about 2.4 as of 2 weeks ago. Patient sees Dr. Murlean Iba in the office. He was last seen in last month.As per the family while sitting in the car he became on responsive. The EMT came and did CPR. Patient was unresponsive for about 20 minutes finally he regained pulse. Patient is now intubated and sedated EXAM: CT CHEST, ABDOMEN AND PELVIS WITHOUT CONTRAST TECHNIQUE: Multidetector CT imaging of the chest, abdomen and pelvis was performed following the standard protocol without IV contrast. Examlimited by lack of intravenous and oral contrast and due to artifact from the arms being at patient's side. COMPARISON:  07/21/2020. FINDINGS: CT CHEST FINDINGS Cardiovascular: Heart is normal in size and configuration. No pericardial effusion. Great vessels normal in caliber. Mild aortic atherosclerosis. Mediastinum/Nodes: Endotracheal tube tip projects at the upper level of the carina. Nasal/orogastric tube passes below the diaphragm, well within the stomach. No neck base, axillary, mediastinal or hilar masses or enlarged lymph nodes. Trachea and esophagus are grossly unremarkable. Lungs/Pleura: Small pleural effusions. Bilateral patchy areas of ground-glass and more confluent opacity, most evident in the right upper lobe. Additional opacity is noted dependently in the lower lobes consistent with atelectasis. Mild  interstitial thickening bilaterally. No mass. No suspicious nodule. No pneumothorax. Musculoskeletal: Subtle nondisplaced fractures of the anterior right 7 anterior left sixth ribs. No other fractures. No bone lesions. CT ABDOMEN PELVIS FINDINGS Hepatobiliary: Limited assessment of the from the liver and gallbladder due to artifact arms. Liver normal in size. No convincing mass. Gallbladder unremarkable. No evidence duct dilation. Pancreas: Assessment also somewhat limited by artifact from the arms. No mass or inflammation. Spleen: Normal in size without focal abnormality. Adrenals/Urinary Tract: No adrenal masses. Kidneys normal in orientation and position. Areas of renal cortical thinning, likely scarring. No convincing renal mass, no stone and no hydronephrosis. Ureters not  well visualized, but no evidence of a ureteral stone or ureteral dilation. Bladder is decompressed with a Foley catheter. Stomach/Bowel: Stomach is unremarkable. Small bowel and colon are normal in caliber. No wall thickening. No inflammation. Scattered left colon diverticula. Mild increase in the colonic stool burden. No evidence of appendicitis. Vascular/Lymphatic: Stable vena cava filter. Single tiny focus of aortic atherosclerotic calcification. No aneurysm. No enlarged lymph nodes. Reproductive: Unremarkable. Other: No abdominal wall hernia.  No ascites. Musculoskeletal: No fracture or acute finding.  No bone lesion. IMPRESSION: CT CHEST 1. Patchy bilateral ground-glass and confluent airspace opacities, most evident in the right upper lobe, which could be the result CPR, but is more suggestive of multifocal pneumonia. 2. Nondisplaced fractures of the anterior right seventh and anterior left sixth ribs presumably occurring during CPR. 3. Dependent opacity in both lower lobes consistent with atelectasis. Small effusions. ABDOMEN AND PELVIS CT 1. No acute findings within the abdomen or pelvis. Electronically Signed   By: Lajean Manes M.D.    On: 11/11/2020 14:30     Medications:   . sodium chloride 10 mL/hr at 11/05/2020 2130  . fentaNYL infusion INTRAVENOUS 150 mcg/hr (11/19/20 1124)  . levETIRAcetam Stopped (11/19/20 0417)  . midazolam 2 mg/hr (11/19/20 0616)  . norepinephrine (LEVOPHED) Adult infusion 7 mcg/min (11/19/20 0837)   . bacitracin   Topical BID  . chlorhexidine gluconate (MEDLINE KIT)  15 mL Mouth Rinse BID  . Chlorhexidine Gluconate Cloth  6 each Topical Q0600  . mouth rinse  15 mL Mouth Rinse 10 times per day  . pantoprazole (PROTONIX) IV  40 mg Intravenous QHS  . sodium zirconium cyclosilicate  10 g Per Tube TID    Assessment/ Plan:     Active Problems:   Cardiac arrest [facility])  80 year old male with history of diabetes, peripheral vascular disease with nonhealing ulcers on the right lower extremity, hypertension, congestive heart failure, BPH, history of DVT, history of COVID infection, dementia and chronic kidney disease stage IIIb with a baseline creatinine of about 2.4 as of 2 weeks ago.  #1 cardiopulmonary arrest: Patient is now intubated and sedated.  Neuro evaluation in progress. Wean pressors as tolerated.  #2 acute kidney injury on the top of chronic kidney disease.  Acute kidney injury is most likely due to acute tubular necrosis due to ischemia secondary to cardiac arrest.  We will maintain the Foley catheter.  Urine output has been improving.  #3 anemia: Severe anemia, and had multiple units of blood transfusion.  #4 pneumonia: We will continue the antibiotics as ordered.  #5 congestive heart failure: We will use diuretics judiciously as needed.  Spoke to family again at bedside today.  Overall prognosis seems to be poor   LOS: [place], MD [facility] 5/30/20221:55 PM

## 2020-11-19 NOTE — Progress Notes (Signed)
Progress Note  Patient Name: Justin Shannon. Date of Encounter: 11/19/2020  [facility] Cardiologist: Kate Sable, MD   Subjective   Intubated, sedated Tightened up pulled head off the pillow and responds to stimuli Slow to relax and breathe, fighting the vent on stimuli Neurology following EEG planned No significant arrhythmia overnight  No family at the bedside  Inpatient Medications    Scheduled Meds: . bacitracin   Topical BID  . chlorhexidine gluconate (MEDLINE KIT)  15 mL Mouth Rinse BID  . Chlorhexidine Gluconate Cloth  6 each Topical Q0600  . furosemide  40 mg Intravenous Once  . mouth rinse  15 mL Mouth Rinse 10 times per day  . pantoprazole (PROTONIX) IV  40 mg Intravenous QHS  . sodium zirconium cyclosilicate  10 g Per Tube TID  . vancomycin variable dose per unstable renal function (pharmacist dosing)   Does not apply See admin instructions   Continuous Infusions: . sodium chloride 10 mL/hr at 11/03/2020 2130  . dexmedetomidine (PRECEDEX) IV infusion Stopped (10/31/2020 1921)  . fentaNYL infusion INTRAVENOUS 150 mcg/hr (11/19/20 0616)  . levETIRAcetam Stopped (11/19/20 0417)  . midazolam 2 mg/hr (11/19/20 0616)  . norepinephrine (LEVOPHED) Adult infusion 7 mcg/min (11/19/20 0837)  . piperacillin-tazobactam (ZOSYN)  IV 12.5 mL/hr at 11/19/20 0615   PRN Meds: docusate sodium, fentaNYL, midazolam, polyethylene glycol   Vital Signs    Vitals:   11/19/20 0819 11/19/20 0900 11/19/20 1000 11/19/20 1100  BP: (!) 122/59 (!) 116/55 (!) 119/54 (!) 113/53  Pulse:  66 81 69  Resp:  19 20 16   Temp: (!) 96.8 F (36 C) (!) 96.8 F (36 C) (!) 97 F (36.1 C) (!) 96.8 F (36 C)  TempSrc: Bladder     SpO2:  96% 91% 100%  Weight:      Height:        Intake/Output Summary (Last 24 hours) at 11/19/2020 1109 Last data filed at 11/19/2020 1100 Gross per 24 hour  Intake 3269.15 ml  Output 1650 ml  Net 1619.15 ml   Last 3 Weights 10/22/2020 11/16/2020  10/24/2020  Weight (lbs) 314 lb 13.1 oz 307 lb 326 lb  Weight (kg) 142.8 kg 139.254 kg 147.873 kg      Telemetry    Normal sinus rhythm- Personally Reviewed  ECG     - Personally Reviewed  Physical Exam   GEN:  Intubated sedated Neck: Unable to estimate JVD Cardiac: RRR, no murmurs, rubs, or gallops.  Respiratory:  Coarse breath sounds GI: Soft,  non-distended  MS:  1+ bilateral feet edema; No deformity. Neuro:  Full exam not performed Psych: Sedated  Labs    High Sensitivity Troponin:   Recent Labs  Lab 11/02/2020 1207 11/16/2020 1532  TROPONINIHS 30* 119*      Chemistry Recent Labs  Lab 10/29/2020 1207 11/09/2020 2032 11/19/20 0459  NA 144 144 145  K 4.8 5.6* 5.2*  CL 111 112* 115*  CO2 19* 22 22  GLUCOSE 175* 144* 123*  BUN 99* 106* 112*  CREATININE 3.18* 3.42* 3.60*  CALCIUM 9.0 8.3* 8.0*  PROT 5.6*  --   --   ALBUMIN 2.8*  --   --   AST 179*  --   --   ALT 159*  --   --   ALKPHOS 108  --   --   BILITOT 0.5  --   --   GFRNONAA 19* 18* 17*  ANIONGAP 14 10 8      Hematology Recent  Labs  Lab 10/26/2020 1241 11/17/2020 1336 11/17/2020 1812 11/19/20 0459  WBC 12.7* 13.5*  --  11.4*  RBC 1.19* 1.16*  --  2.36*  HGB 2.5* 2.5* 4.5* 6.5*  HCT 8.0* 7.9* 14.1* 18.9*  MCV 67.2* 68.1*  --  80.1  MCH 21.0* 21.6*  --  27.5  MCHC 31.3 31.6  --  34.4  RDW 20.2* 20.3*  --  22.4*  PLT 153 151  --  108*    BNPNo results for input(s): BNP, PROBNP in the last 168 hours.   DDimer No results for input(s): DDIMER in the last 168 hours.   Radiology    CT HEAD WO CONTRAST  Result Date: 11/07/2020 CLINICAL DATA:  Suspected stroke. EXAM: CT HEAD WITHOUT CONTRAST TECHNIQUE: Contiguous axial images were obtained from the base of the skull through the vertex without intravenous contrast. COMPARISON:  04/20/2021 FINDINGS: Brain: No evidence of acute infarction, hemorrhage, hydrocephalus, extra-axial collection or mass lesion/mass effect. Moderate brain parenchymal volume loss  and deep white matter microangiopathy. 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 Vascular: Calcific atherosclerotic disease of the intra cavernous carotid arteries. Skull: Normal. Negative for fracture or focal lesion. Sinuses/Orbits: No acute finding. Other: None. IMPRESSION: 1. No acute intracranial abnormality. 2. Atrophy, chronic microvascular disease. Electronically Signed   By: Fidela Salisbury M.D.   On: 10/30/2020 14:26   DG Chest Port 1 View  Result Date: 11/03/2020 CLINICAL DATA:  80 year old male with central line placement. EXAM: PORTABLE CHEST 1 VIEW COMPARISON:  Earlier chest radiograph dated 11/01/2020 and CT dated 11/01/2020 FINDINGS: Interval placement of a right IJ central venous line with tip close to the cavoatrial junction or over the right atrium. Endotracheal tube approximately 2 cm above the carina. The tube can be pulled back by 2 cm for optimal positioning. Enteric tube with tip in the left upper abdomen likely in the gastric fundus. Shallow inspiration with bilateral streaky densities similar to prior radiograph. No large pleural effusion or pneumothorax. Stable cardiomegaly. Atherosclerotic calcification of the aorta. No acute osseous pathology. IMPRESSION: 1. Interval placement of a right IJ central venous line with tip close to the cavoatrial junction. No pneumothorax. 2. Endotracheal tube above the carina. The tube can be pulled back by 2 cm for optimal positioning. 3. No interval change in bilateral pulmonary opacities. Electronically Signed   By: Anner Crete M.D.   On: 10/29/2020 15:27   DG Chest Portable 1 View  Result Date: 10/27/2020 CLINICAL DATA:  80 year old male status post intubation. EXAM: PORTABLE CHEST 1 VIEW COMPARISON:  Chest x-ray 06/28/2020. FINDINGS: An endotracheal tube is in place with tip 2.5 cm above the carina. A nasogastric tube is seen extending into the stomach, however, the tip of the nasogastric tube  extends below the lower margin of the image. Transcutaneous defibrillator pads project over the lower left hemithorax. Lung volumes are low. Cephalization of the pulmonary vasculature. Airspace consolidation in the right upper lobe and periphery of the right lung base. Left lung otherwise appears clear. No definite pleural effusions. No pneumothorax. Heart size is mildly enlarged. Upper mediastinal contours are distorted by patient positioning. Aortic atherosclerosis. IMPRESSION: 1. Support apparatus, as above. 2. Right upper lobe airspace consolidation and additional consolidative changes in the right lung base concerning for multilobar pneumonia. 3. Cardiomegaly with pulmonary venous congestion. 4. Aortic atherosclerosis. Electronically Signed   By: Vinnie Langton M.D.   On: 11/16/2020 12:29   DG Abd Portable 1 View  Result Date:  11/01/2020 CLINICAL DATA:  80 year old male status post intubation. EXAM: PORTABLE ABDOMEN - 1 VIEW COMPARISON:  No priors. FINDINGS: Nasogastric tube in the mid stomach. IVC filter projecting over the right mid abdomen. Gas and stool are seen scattered throughout the colon extending to the level of the distal rectum. No pathologic distension of small bowel is noted. No gross evidence of pneumoperitoneum. IMPRESSION: 1. Support apparatus, as above. 2. Nonobstructive bowel gas pattern. Electronically Signed   By: Vinnie Langton M.D.   On: 11/13/2020 12:31   ECHOCARDIOGRAM COMPLETE  Result Date: 11/09/2020    ECHOCARDIOGRAM REPORT   Patient Name:   Justin Shannon. Date of Exam: 10/27/2020 Medical Rec #:  [id number]          Height:       70.5 in Accession #:    [id number]         Weight:       314.8 lb Date of Birth:  [birth year]           BSA:          2.545 m Patient Age:    38 years           BP:           134/69 mmHg Patient Gender: M                  HR:           92 bpm. Exam Location:  [facility] Procedure: 2D Echo and Intracardiac Opacification Agent Indications:     Cardiac  arrest I46.9  History:         Patient has no prior history of Echocardiogram examinations.  Sonographer:     Kathlen Brunswick RDCS Referring Phys:  [id number] Ottie Glazier Diagnosing Phys: Ida Rogue MD  Sonographer Comments: Technically challenging study due to limited acoustic windows, suboptimal subcostal window and echo performed with patient supine and on artificial respirator. Image acquisition challenging due to respiratory motion and Image acquisition challenging due to patient body habitus. IMPRESSIONS  1. Left ventricular ejection fraction, by estimation, is 60 to 65%. The left ventricle has normal function. The left ventricle has no regional wall motion abnormalities. There is moderate left ventricular hypertrophy. Left ventricular diastolic parameters are consistent with Grade I diastolic dysfunction (impaired relaxation).  2. Right ventricular systolic function is normal. The right ventricular size is normal. Tricuspid regurgitation signal is inadequate for assessing PA pressure.  3. The mitral valve is normal in structure. No evidence of mitral valve regurgitation. No evidence of mitral stenosis. FINDINGS  Left Ventricle: Left ventricular ejection fraction, by estimation, is 60 to 65%. The left ventricle has normal function. The left ventricle has no regional wall motion abnormalities. Definity contrast agent was given IV to delineate the left ventricular  endocardial borders. The left ventricular internal cavity size was normal in size. There is moderate left ventricular hypertrophy. Left ventricular diastolic parameters are consistent with Grade I diastolic dysfunction (impaired relaxation). Right Ventricle: The right ventricular size is normal. No increase in right ventricular wall thickness. Right ventricular systolic function is normal. Tricuspid regurgitation signal is inadequate for assessing PA pressure. Left Atrium: Left atrial size was normal in size. Right Atrium: Right atrial size was  normal in size. Pericardium: There is no evidence of pericardial effusion. Mitral Valve: The mitral valve is normal in structure. No evidence of mitral valve regurgitation. No evidence of mitral valve stenosis. Tricuspid Valve: The tricuspid valve is normal in structure. Tricuspid valve  regurgitation is mild . No evidence of tricuspid stenosis. Aortic Valve: The aortic valve is normal in structure. Aortic valve regurgitation is not visualized. No aortic stenosis is present. Aortic valve mean gradient measures 7.0 mmHg. Aortic valve peak gradient measures 14.7 mmHg. Pulmonic Valve: The pulmonic valve was normal in structure. Pulmonic valve regurgitation is not visualized. No evidence of pulmonic stenosis. Aorta: The aortic root is normal in size and structure. Venous: The inferior vena cava is normal in size with greater than 50% respiratory variability, suggesting right atrial pressure of 3 mmHg. IAS/Shunts: No atrial level shunt detected by color flow Doppler.  LEFT VENTRICLE PLAX 2D LVIDd:         3.66 cm Diastology LVIDs:         2.60 cm LV e' medial:    9.03 cm/s LV PW:         1.34 cm LV E/e' medial:  7.6 LV IVS:        1.30 cm LV e' lateral:   11.30 cm/s                        LV E/e' lateral: 6.1  RIGHT VENTRICLE RV S prime:     13.75 cm/s TAPSE (M-mode): 3.7 cm LEFT ATRIUM           Index       RIGHT ATRIUM           Index LA diam:      4.00 cm 1.57 cm/m  RA Area:     13.10 cm LA Vol (A4C): 52.7 ml 20.71 ml/m RA Volume:   33.30 ml  13.09 ml/m  AORTIC VALVE AV Vmax:           191.50 cm/s AV Vmean:          121.000 cm/s AV VTI:            0.317 m AV Peak Grad:      14.7 mmHg AV Mean Grad:      7.0 mmHg LVOT Vmax:         131.00 cm/s LVOT Vmean:        83.700 cm/s LVOT VTI:          0.249 m LVOT/AV VTI ratio: 0.79 MITRAL VALVE               TRICUSPID VALVE MV Area (PHT): 3.68 cm    TV Peak grad:   29.1 mmHg MV Decel Time: 206 msec    TV Vmax:        2.70 m/s MV E velocity: 68.40 cm/s MV A velocity: 98.50  cm/s  SHUNTS MV E/A ratio:  0.69        Systemic VTI: 0.25 m Ida Rogue MD Electronically signed by Ida Rogue MD Signature Date/Time: 10/27/2020/4:34:13 PM    Final    CT CHEST ABDOMEN PELVIS WO CONTRAST  Result Date: 11/20/2020 CLINICAL DATA:  Pt arrived ems from home. Pt was at home and collapsed and CPR was started. 18 mins of CPR. 3 epi, 2 bicarb, 1 calcium. ROSC achieved. While en route, pt lost pulses again but after 2 round of CPR ROSC was obtained again with history of diabetes, peripheral vascular disease with nonhealing ulcers on the right lower extremity, hypertension, congestive heart failure, BPH, history of DVT, history of COVID infection, dementia and chronic kidney disease stage IIIb with a baseline creatinine of about 2.4 as of 2 weeks ago. Patient sees Dr. Nolon Lennert  Singh in the office. He was last seen in last month.As per the family while sitting in the car he became on responsive. The EMT came and did CPR. Patient was unresponsive for about 20 minutes finally he regained pulse. Patient is now intubated and sedated EXAM: CT CHEST, ABDOMEN AND PELVIS WITHOUT CONTRAST TECHNIQUE: Multidetector CT imaging of the chest, abdomen and pelvis was performed following the standard protocol without IV contrast. Examlimited by lack of intravenous and oral contrast and due to artifact from the arms being at patient's side. COMPARISON:  07/21/2020. FINDINGS: CT CHEST FINDINGS Cardiovascular: Heart is normal in size and configuration. No pericardial effusion. Great vessels normal in caliber. Mild aortic atherosclerosis. Mediastinum/Nodes: Endotracheal tube tip projects at the upper level of the carina. Nasal/orogastric tube passes below the diaphragm, well within the stomach. No neck base, axillary, mediastinal or hilar masses or enlarged lymph nodes. Trachea and esophagus are grossly unremarkable. Lungs/Pleura: Small pleural effusions. Bilateral patchy areas of ground-glass and more confluent opacity,  most evident in the right upper lobe. Additional opacity is noted dependently in the lower lobes consistent with atelectasis. Mild interstitial thickening bilaterally. No mass. No suspicious nodule. No pneumothorax. Musculoskeletal: Subtle nondisplaced fractures of the anterior right 7 anterior left sixth ribs. No other fractures. No bone lesions. CT ABDOMEN PELVIS FINDINGS Hepatobiliary: Limited assessment of the from the liver and gallbladder due to artifact arms. Liver normal in size. No convincing mass. Gallbladder unremarkable. No evidence duct dilation. Pancreas: Assessment also somewhat limited by artifact from the arms. No mass or inflammation. Spleen: Normal in size without focal abnormality. Adrenals/Urinary Tract: No adrenal masses. Kidneys normal in orientation and position. Areas of renal cortical thinning, likely scarring. No convincing renal mass, no stone and no hydronephrosis. Ureters not well visualized, but no evidence of a ureteral stone or ureteral dilation. Bladder is decompressed with a Foley catheter. Stomach/Bowel: Stomach is unremarkable. Small bowel and colon are normal in caliber. No wall thickening. No inflammation. Scattered left colon diverticula. Mild increase in the colonic stool burden. No evidence of appendicitis. Vascular/Lymphatic: Stable vena cava filter. Single tiny focus of aortic atherosclerotic calcification. No aneurysm. No enlarged lymph nodes. Reproductive: Unremarkable. Other: No abdominal wall hernia.  No ascites. Musculoskeletal: No fracture or acute finding.  No bone lesion. IMPRESSION: CT CHEST 1. Patchy bilateral ground-glass and confluent airspace opacities, most evident in the right upper lobe, which could be the result CPR, but is more suggestive of multifocal pneumonia. 2. Nondisplaced fractures of the anterior right seventh and anterior left sixth ribs presumably occurring during CPR. 3. Dependent opacity in both lower lobes consistent with atelectasis. Small  effusions. ABDOMEN AND PELVIS CT 1. No acute findings within the abdomen or pelvis. Electronically Signed   By: Lajean Manes M.D.   On: 11/17/2020 14:30    Cardiac Studies   Echo 1. Left ventricular ejection fraction, by estimation, is 60 to 65%. The  left ventricle has normal function. The left ventricle has no regional  wall motion abnormalities. There is moderate left ventricular hypertrophy.  Left ventricular diastolic  parameters are consistent with Grade I diastolic dysfunction (impaired  relaxation).  2. Right ventricular systolic function is normal. The right ventricular  size is normal. Tricuspid regurgitation signal is inadequate for assessing  PA pressure.  3. The mitral valve is normal in structure. No evidence of mitral valve  regurgitation. No evidence of mitral stenosis.  Patient Profile     Justin Shannon. is a 80 y.o. 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, seizures, sleep apnea, gout, stage III chronic kidney disease, BPH, arthritis, and dementia, who is being seen today for the evaluation of cardiac arrest in the setting of profound anemia with a hemoglobin of 2.5  Assessment & Plan   Cardiac/respiratory arrest  Bilateral pulmonary opacities and hemoglobin 2.5 on arrival Collapse at home given 18 minutes CPR, 3 epi, 2 bicarb, 1 calcium with ROSC  No significant cardiac arrhythmia recorded On arrival, normal echocardiogram, minimally elevated troponin, does not appear to be primary ischemic etiology  -Significant concern in the setting of hemoglobin of 2.5  on arrival - intubated, sedated on Precedex  -Maintaining normal sinus rhythm , no acute acute events overnight -Neurology following, EEG planned   Profound anemia/microcytic  Etiology unclear, hematology consulted  Hemoglobin 2.5 on arrival, still low at 6.5 Does not appear to have acute blood loss, Xarelto 10 mg daily for DVT prophylaxis held for now     Multifocal pneumonia/acute respiratory distress  In the setting of profound anemia  X-ray with bilateral pulmonary opacities  on broad-spectrum antibiotics    [facility] will sign off.   Medication Recommendations: No further medication changes Other recommendations (labs, testing, etc): No further cardiac testing Follow up as an outpatient: We will arrange as he recovers   Total encounter time more than 35 minutes  Greater than 50% was spent in counseling and coordination of care with the patient   For questions or updates, please contact [place] Please consult [email or web address] for contact info under        Signed, Ida Rogue, MD  11/19/2020, 11:09 AM

## 2020-11-19 NOTE — Progress Notes (Signed)
Neurology into evaluate patient. Patient responds to pain, and withdrawals upper extremities. Pupils are pin point and non reactive. Patient on versed and fentanyl. Per MD will ger EEG today. Continue to assess.

## 2020-11-19 NOTE — Assessment & Plan Note (Addendum)
#  80 year old male patient with multiple comorbidities-including CKD/CHF-is currently admitted to hospital-cardiac arrest s/p CPR noted to have severe anemia  # Severe anemia hemoglobin 2.5 [on admission]-s/p 6 units of PRBC; hemoglobin 7-8.  Etiology is unclear however appears acute on chronic iron deficiency.  No obvious GI losses noted; CT scan negative for any intra-abdominal bleed.  Hemolysis work-up negative.  Recommend IV iron.  #CKD stage IV; Lewy body dementia -Parkinson's /CHF/cardiac arrest status post CPR; s/p intubation-sedated/pressors  #Discussed with family: Had a long discussion with family/daughter-reviewed the labs extensively.  Suspect severe anemia had a role in patient's decompensation/precipitating an acute neurologic/cardiac event.  However, unfortunately in spite of correcting the anemia-patient's neurologic status has not improved which unfortunately is grave prognostic sign.  Reviewed the neurology impression/recommendation with the family.  Discussed with Dr. Mortimer Fries.

## 2020-11-19 NOTE — Progress Notes (Signed)
CRITICAL CARE PROGRESS NOTE    Name: [facility]. MRN: [id number] DOB: Feb 03, 1941     LOS: 1   SUBJECTIVE FINDINGS & SIGNIFICANT EVENTS    Patient description:  This is 80 year old male with a history of CKD 4, chronic venous insufficiency, DVT on Xarelto, peripheral artery disease, CHF seen by [facility] last transthoracic echo done December 2021, morbid obesity, obstructive sleep apnea on CPAP device at home, type 2 diabetes, Lewy body dementia with parkinsonism, chronic anemia history of total knee replacement, seizure disorder came in status post cardiac arrest.  Received ACLS 20 minutes with ROSC.  Lost pulse again and required ACLS second time followed by ROSC required endotracheal intubation in the ER.  Medical intensive care unit admission post cardiac arrest.    Evaluated patient with nephrologist together at the bedside.  Family was present during my evaluation.  They deny flulike illness malaise, myalgia he did previously have COVID-19 and recovered to baseline.  Additional review of systems reveals decreased appetite over 48 hours.  Denies diarrhea nausea vomiting or any additional symptoms.  Labs show chronic anemia, CKD metabolic acidosis with high anion gap.  Vitals without hypotension or shock.  On exam GCS 4 T with plan to perform CT head to rule out intracerebral hemorrhage followed by [facility] cooling protocol post cardiac arrest.     11/19/20- patient finishing [facility] protocol. He received several bags of PRBC. Heme/onc  workup is in process. Met with family and reviewed care plan.    Lines/tubes : Airway 8 mm (Active)  Secured at (cm) 25 cm 10/30/2020 1207  Measured From Lips 11/15/2020 [facility] 10/27/2020 1207  Secured By [employer] 11/15/2020 1207  Cuff Pressure  (cm H2O) Green OR 18-26 [facility] 11/08/2020 1207  Site Condition Cool;Dry 10/21/2020 1207     Negative Pressure Wound Therapy Knee Right;Anterior (Active)     NG/OG Tube Orogastric 18 Fr. Xray;Confirmed by Surgical Manipulation 60 cm (Active)  Cm Marking at Nare/Corner of Mouth (if applicable) 60 cm 00/37/04 1211     Urethral Catheter Melissa, SN Temperature probe 16 Fr. (Active)  Indication for Insertion or Continuance of Catheter Unstable critically ill patients first 24-48 hours (See Criteria) 11/17/2020 1225  Site Assessment Clean;Intact;Dry 10/28/2020 1225  Catheter Maintenance Bag below level of bladder;Catheter secured;Drainage bag/tubing not touching floor;Insertion date on drainage bag 11/06/2020 1225  Collection Container Standard drainage bag 11/04/2020 1225  Securement Method Tape 10/31/2020 1225    Microbiology/Sepsis markers: Results for orders placed or performed during the hospital encounter of 11/10/2020  Resp Panel by RT-PCR (Flu A&B, Covid) Nasopharyngeal Swab     Status: None   Collection Time: 11/10/2020 12:07 PM   Specimen: Nasopharyngeal Swab; Nasopharyngeal(NP) swabs in vial transport medium  Result Value Ref Range Status   SARS Coronavirus 2 by RT PCR NEGATIVE NEGATIVE Final    Comment: (NOTE) SARS-CoV-2 target nucleic acids are NOT DETECTED.  The SARS-CoV-2 RNA is generally detectable in upper respiratory specimens during the acute phase of infection. The lowest concentration of SARS-CoV-2 viral copies this assay can detect is 138 copies/mL. A negative result does not preclude SARS-Cov-2 infection and should not be used as the sole basis for treatment or other patient management decisions. A negative result may occur with  improper specimen collection/handling, submission of specimen other than nasopharyngeal swab, presence of viral mutation(s) within the areas targeted by this assay, and inadequate number of viral copies(<138 copies/mL). A negative result must be  combined  with clinical observations, patient history, and epidemiological information. The expected result is Negative.  Fact Sheet for Patients:  EntrepreneurPulse.com.au  Fact Sheet for Healthcare Providers:  IncredibleEmployment.be  This test is no t yet approved or cleared by the [place] FDA and  has been authorized for detection and/or diagnosis of SARS-CoV-2 by FDA under an Emergency Use Authorization (EUA). This EUA will remain  in effect (meaning this test can be used) for the duration of the COVID-19 declaration under Section 564(b)(1) of the Act, 21 U.S.C.section 360bbb-3(b)(1), unless the authorization is terminated  or revoked sooner.       Influenza A by PCR NEGATIVE NEGATIVE Final   Influenza B by PCR NEGATIVE NEGATIVE Final    Comment: (NOTE) The Xpert Xpress SARS-CoV-2/FLU/RSV plus assay is intended as an aid in the diagnosis of influenza from Nasopharyngeal swab specimens and should not be used as a sole basis for treatment. Nasal washings and aspirates are unacceptable for Xpert Xpress SARS-CoV-2/FLU/RSV testing.  Fact Sheet for Patients: EntrepreneurPulse.com.au  Fact Sheet for Healthcare Providers: IncredibleEmployment.be  This test is not yet approved or cleared by the [place] FDA and has been authorized for detection and/or diagnosis of SARS-CoV-2 by FDA under an Emergency Use Authorization (EUA). This EUA will remain in effect (meaning this test can be used) for the duration of the COVID-19 declaration under Section 564(b)(1) of the Act, 21 U.S.C. section 360bbb-3(b)(1), unless the authorization is terminated or revoked.  Performed at [facility], [address] [place], [place] [address]   CULTURE, BLOOD (ROUTINE X 2) w Reflex to ID Panel     Status: None (Preliminary result)   Collection Time: 11/19/2020 12:41 PM   Specimen: BLOOD  Result Value Ref Range Status    Specimen Description BLOOD LEFT ASSIST CONTROL  Final   Special Requests   Final    BOTTLES DRAWN AEROBIC AND ANAEROBIC Blood Culture adequate volume   Culture   Final    NO GROWTH < 24 HOURS Performed at [facility], [address] [place], [place] [address]    Report Status PENDING  Incomplete  CULTURE, BLOOD (ROUTINE X 2) w Reflex to ID Panel     Status: None (Preliminary result)   Collection Time: 11/12/2020 12:42 PM   Specimen: BLOOD  Result Value Ref Range Status   Specimen Description BLOOD RIGHT ASSIST CONTROL  Final   Special Requests   Final    BOTTLES DRAWN AEROBIC AND ANAEROBIC Blood Culture adequate volume   Culture   Final    NO GROWTH < 24 HOURS Performed at [facility], [address], [place], [place] [address]    Report Status PENDING  Incomplete  MRSA PCR Screening     Status: None   Collection Time: 11/05/2020  3:55 PM   Specimen: Nasal Mucosa; Nasopharyngeal  Result Value Ref Range Status   MRSA by PCR NEGATIVE NEGATIVE Final    Comment:        The GeneXpert MRSA Assay (FDA approved for NASAL specimens only), is one component of a comprehensive MRSA colonization surveillance program. It is not intended to diagnose MRSA infection nor to guide or monitor treatment for MRSA infections. Performed at [facility], [address] [place], [place] [address]   Culture, respiratory (tracheal aspirate)     Status: None (Preliminary result)   Collection Time: 11/05/2020  4:30 PM   Specimen: Tracheal Aspirate; Respiratory  Result Value Ref Range Status   Specimen  Description   Final    TRACHEAL ASPIRATE Performed at [facility], [address], [place], [place] [address]    Special Requests   Final    NONE Performed at [facility], [address] [place], [place] [address]    Gram Stain PENDING  Incomplete   Culture   Final    NO GROWTH < 12 HOURS Performed at [facility], [address]  [place], [place] [address]    Report Status PENDING  Incomplete  Respiratory (~20 pathogens) panel by PCR     Status: None   Collection Time: 11/10/2020  4:30 PM  Result Value Ref Range Status   Adenovirus NOT DETECTED NOT DETECTED Final   Coronavirus 229E NOT DETECTED NOT DETECTED Final    Comment: (NOTE) The Coronavirus on the Respiratory Panel, DOES NOT test for the novel  Coronavirus (2019 nCoV)    Coronavirus HKU1 NOT DETECTED NOT DETECTED Final   Coronavirus NL63 NOT DETECTED NOT DETECTED Final   Coronavirus OC43 NOT DETECTED NOT DETECTED Final   Metapneumovirus NOT DETECTED NOT DETECTED Final   Rhinovirus / Enterovirus NOT DETECTED NOT DETECTED Final   Influenza A NOT DETECTED NOT DETECTED Final   Influenza B NOT DETECTED NOT DETECTED Final   Parainfluenza Virus 1 NOT DETECTED NOT DETECTED Final   Parainfluenza Virus 2 NOT DETECTED NOT DETECTED Final   Parainfluenza Virus 3 NOT DETECTED NOT DETECTED Final   Parainfluenza Virus 4 NOT DETECTED NOT DETECTED Final   Respiratory Syncytial Virus NOT DETECTED NOT DETECTED Final   Bordetella pertussis NOT DETECTED NOT DETECTED Final   Bordetella Parapertussis NOT DETECTED NOT DETECTED Final   Chlamydophila pneumoniae NOT DETECTED NOT DETECTED Final   Mycoplasma pneumoniae NOT DETECTED NOT DETECTED Final    Comment: Performed at [facility], [place]. [address] [place], [place] [address]    Anti-infectives:  Anti-infectives (From admission, onward)   Start     Dose/Rate Route Frequency Ordered Stop   11/01/2020 2200  piperacillin-tazobactam (ZOSYN) IVPB 3.375 g        3.375 g 12.5 mL/hr over 240 Minutes Intravenous Every 8 hours 11/17/2020 1424     11/10/2020 1430  vancomycin (VANCOREADY) IVPB 2000 mg/400 mL        2,000 mg 200 mL/hr over 120 Minutes Intravenous  Once 11/14/2020 1315 10/31/2020 1831   10/30/2020 1424  vancomycin variable dose per unstable renal function (pharmacist dosing)         Does not apply See admin instructions 11/11/2020  1424     11/11/2020 1300  piperacillin-tazobactam (ZOSYN) IVPB 3.375 g        3.375 g 100 mL/hr over 30 Minutes Intravenous  Once 11/15/2020 1248 11/02/2020 1328   11/19/2020 1300  azithromycin (ZITHROMAX) 500 mg in sodium chloride 0.9 % 250 mL IVPB  Status:  Discontinued        500 mg 250 mL/hr over 60 Minutes Intravenous  Once 11/09/2020 1248 11/02/2020 1423        PAST MEDICAL HISTORY   Past Medical History:  Diagnosis Date  . (HFpEF) heart failure with preserved ejection fraction ([place])    a. 05/2020 Echo: EF >55%, Gr2 DD. Mildly enlarged RV. Mild LAE. Mild MR.  . Arthritis   . Bilateral swelling of feet   . BPH (benign prostatic hyperplasia)   . CKD (chronic kidney disease), stage III ([place])   . COVID-19 virus infection 06/2019  . Dementia ([place])    a. requires  24 hr care  . Essential hypertension   . Gout   . Left Foot Tendonitis   . Right Lower Extremity DVT (deep venous thrombosis) ([place])    a. On Xarelto; s/p IVC filter.  . Seizures ([place])    a. on keppra  . Sleep apnea      SURGICAL HISTORY   Past Surgical History:  Procedure Laterality Date  . APPENDECTOMY    . APPLICATION OF WOUND VAC Right 11/02/2019   Procedure: APPLICATION OF WOUND VAC;  Surgeon: Dereck Leep, MD;  Location: [facility] ORS;  Service: Orthopedics;  Laterality: Right;  [id number]  . HERNIA REPAIR     UMBILICAL  . IVC FILTER INSERTION N/A 10/25/2019   Procedure: IVC FILTER INSERTION;  Surgeon: Katha Cabal, MD;  Location: [facility];  Service: Cardiovascular;  Laterality: N/A;  . KNEE ARTHROPLASTY Right 11/02/2019   Procedure: COMPUTER ASSISTED TOTAL KNEE ARTHROPLASTY;  Surgeon: Dereck Leep, MD;  Location: [facility] ORS;  Service: Orthopedics;  Laterality: Right;  . PATELLAR TENDON REPAIR Right 12/05/2019   Procedure: PATELLA TENDON REPAIR;  Surgeon: Dereck Leep, MD;  Location: [facility] ORS;  Service: Orthopedics;  Laterality: Right;  . PROSTATE SURGERY       FAMILY HISTORY   Family History   Problem Relation Age of Onset  . Arthritis Mother   . Hypertension Mother   . Diabetes Brother      SOCIAL HISTORY   Social History   Tobacco Use  . Smoking status: Former Smoker    Packs/day: 1.00    Years: 10.00    Pack years: 10.00    Types: Cigarettes    Quit date: 06/23/1968    Years since quitting: 52.4  . Smokeless tobacco: Never Used  Vaping Use  . Vaping Use: Never used  Substance Use Topics  . Alcohol use: No  . Drug use: No     MEDICATIONS   Current Medication:  Current Facility-Administered Medications:  .  0.9 %  sodium chloride infusion, , Intravenous, Continuous, Rust-Chester, Britton L, NP, Last Rate: 10 mL/hr at 10/25/2020 2130, New Bag at 11/15/2020 2130 .  bacitracin ointment, , Topical, BID, Rust-Chester, Britton L, NP .  chlorhexidine gluconate (MEDLINE KIT) (PERIDEX) 0.12 % solution 15 mL, 15 mL, Mouth Rinse, BID, Lanney Gins, Haylea Schlichting, MD, 15 mL at 11/19/20 0819 .  Chlorhexidine Gluconate Cloth 2 % PADS 6 each, 6 each, Topical, Q0600, Ottie Glazier, MD, 6 each at 11/19/20 1002 .  dexmedetomidine (PRECEDEX) 400 MCG/100ML (4 mcg/mL) infusion, 0-1.2 mcg/kg/hr, Intravenous, Continuous, Blake Divine, MD, Stopped at 11/19/2020 1921 .  docusate sodium (COLACE) capsule 100 mg, 100 mg, Oral, BID PRN, Lanney Gins, Atianna Haidar, MD .  fentaNYL (SUBLIMAZE) bolus via infusion 50-100 mcg, 50-100 mcg, Intravenous, Q2H PRN, Rust-Chester, Britton L, NP .  fentaNYL 2590mg in NS 2575m(1066mml) infusion-PREMIX, 100-300 mcg/hr, Intravenous, Continuous, Rust-Chester, Britton L, NP, Last Rate: 15 mL/hr at 11/19/20 0616, 150 mcg/hr at 11/19/20 0616 .  [COMPLETED] levETIRAcetam (KEPPRA) IVPB 1000 mg/100 mL premix, 1,000 mg, Intravenous, Once, Stopped at 10/29/2020 1848 **FOLLOWED BY** levETIRAcetam (KEPPRA) IVPB 500 mg/100 mL premix, 500 mg, Intravenous, Q12H, StaDerek JackD, Stopped at 11/19/20 [id number] MEDLINE mouth rinse, 15 mL, Mouth Rinse, 10 times per day, AleOttie GlazierD, 15  mL at 11/19/20 1002 .  midazolam (VERSED) 50 mg in dextrose 5 % 50 mL (1 mg/mL) infusion, 0.5-10 mg/hr, Intravenous, Continuous, Mayra Brahm, MD, Last Rate: 2 mL/hr at 11/19/20 0616, 2 mg/hr at  11/19/20 0616 .  midazolam (VERSED) bolus via infusion 2-4 mg, 2-4 mg, Intravenous, Q1H PRN, Rust-Chester, Britton L, NP .  norepinephrine (LEVOPHED) 40m in 2574mpremix infusion, 0-50 mcg/min, Intravenous, Titrated, Rust-Chester, Britton L, NP, Last Rate: 26.3 mL/hr at 11/19/20 0837, 7 mcg/min at 11/19/20 0837 .  pantoprazole (PROTONIX) injection 40 mg, 40 mg, Intravenous, QHS, Rust-Chester, Britton L, NP, 40 mg at 10/21/2020 2234 .  piperacillin-tazobactam (ZOSYN) IVPB 3.375 g, 3.375 g, Intravenous, Q8H, GrDallie PilesRPH, Last Rate: 12.5 mL/hr at 11/19/20 062637Infusion Verify at 11/19/20 0615 .  polyethylene glycol (MIRALAX / GLYCOLAX) packet 17 g, 17 g, Per Tube, Daily PRN, AlLanney GinsFuad, MD .  sodium zirconium cyclosilicate (LOKELMA) packet 10 g, 10 g, Per Tube, TID, AlLanney GinsFuad, MD .  vancomycin variable dose per unstable renal function (pharmacist dosing), , Does not apply, See admin instructions, GrDallie PilesRPH    ALLERGIES   Shellfish allergy, Iodinated diagnostic agents, Nsaids, and Rivastigmine    REVIEW OF SYSTEMS     Unable to perform due to mechanical ventilation and acutely comatose state  PHYSICAL EXAMINATION   Vital Signs: Temp:  [93 F (33.9 C)-97 F (36.1 C)] 97 F (36.1 C) (05/30 1000) Pulse Rate:  [41-99] 81 (05/30 1000) Resp:  [13-30] 20 (05/30 1000) BP: (62-145)/(47-92) 119/54 (05/30 1000) SpO2:  [91 %-100 %] 91 % (05/30 1000) FiO2 (%):  [28 %-100 %] 28 % (05/30 0705) Weight:  [142.8 kg] 142.8 kg (05/29 1414)  GENERAL: Morbidly obese age-appropriate, acutely comatose HEAD: Normocephalic, atraumatic.  EYES: Pupils equal, round, reactive to light.  No scleral icterus.  MOUTH: Moist mucosal membrane. NECK: Supple. No thyromegaly. No nodules. No  JVD.  PULMONARY: Rhonchorous breath sounds with crackles in all lung zones bilaterally CARDIOVASCULAR: S1 and S2. Regular rate and rhythm. No murmurs, rubs, or gallops.  GASTROINTESTINAL: Soft, nontender, non-distended. No masses. Positive bowel sounds. No hepatosplenomegaly.  Obese abdomen MUSCULOSKELETAL: No swelling, clubbing, or edema.  NEUROLOGIC: GCS 4 T SKIN:intact,warm,dry   PERTINENT DATA     Infusions: . sodium chloride 10 mL/hr at 11/14/2020 2130  . dexmedetomidine (PRECEDEX) IV infusion Stopped (11/09/2020 1921)  . fentaNYL infusion INTRAVENOUS 150 mcg/hr (11/19/20 0616)  . levETIRAcetam Stopped (11/19/20 0417)  . midazolam 2 mg/hr (11/19/20 0616)  . norepinephrine (LEVOPHED) Adult infusion 7 mcg/min (11/19/20 0837)  . piperacillin-tazobactam (ZOSYN)  IV 12.5 mL/hr at 11/19/20 0615   Scheduled Medications: . bacitracin   Topical BID  . chlorhexidine gluconate (MEDLINE KIT)  15 mL Mouth Rinse BID  . Chlorhexidine Gluconate Cloth  6 each Topical Q0600  . mouth rinse  15 mL Mouth Rinse 10 times per day  . pantoprazole (PROTONIX) IV  40 mg Intravenous QHS  . sodium zirconium cyclosilicate  10 g Per Tube TID  . vancomycin variable dose per unstable renal function (pharmacist dosing)   Does not apply See admin instructions   PRN Medications: docusate sodium, fentaNYL, midazolam, polyethylene glycol Hemodynamic parameters:   Intake/Output: 05/29 0701 - 05/30 0700 In: 2879.2 [I.V.:488.3; Blood:1727.5; IV Piggyback:663.4] Out: 1315 [Urine:865; Emesis/NG output:450]  Ventilator  Settings: Vent Mode: PRVC FiO2 (%):  [28 %-100 %] 28 % Set Rate:  [16 bmp] 16 bmp Vt Set:  [500 mL] 500 mL PEEP:  [5 cmH20] 5 cmH20 Plateau Pressure:  [20 cmH20-22 cmH20] 22 cmH20   LAB RESULTS:  Basic Metabolic Panel: Recent Labs  Lab 11/14/2020 1207 10/22/2020 2032 11/19/20 0459  NA 144 144 145  K 4.8 5.6* 5.2*  CL 111 112* 115*  CO2 19* 22 22  GLUCOSE 175* 144* 123*  BUN 99* 106* 112*   CREATININE 3.18* 3.42* 3.60*  CALCIUM 9.0 8.3* 8.0*  MG 2.6* 2.2 2.4  PHOS  --  5.4* 5.9*   Liver Function Tests: Recent Labs  Lab 10/22/2020 1207  AST 179*  ALT 159*  ALKPHOS 108  BILITOT 0.5  PROT 5.6*  ALBUMIN 2.8*   No results for input(s): LIPASE, AMYLASE in the last 168 hours. No results for input(s): AMMONIA in the last 168 hours. CBC: Recent Labs  Lab 10/23/2020 1241 11/17/2020 1336 11/11/2020 1812 11/19/20 0459  WBC 12.7* 13.5*  --  11.4*  NEUTROABS  --  12.0*  --   --   HGB 2.5* 2.5* 4.5* 6.5*  HCT 8.0* 7.9* 14.1* 18.9*  MCV 67.2* 68.1*  --  80.1  PLT 153 151  --  108*   Cardiac Enzymes: No results for input(s): CKTOTAL, CKMB, CKMBINDEX, TROPONINI in the last 168 hours. BNP: Invalid input(s): POCBNP CBG: Recent Labs  Lab 10/26/2020 1816 11/01/2020 1952 10/30/2020 2348 11/19/20 0351 11/19/20 0735  GLUCAP 141* 138* 129* 122* 99       IMAGING RESULTS:  Imaging: CT HEAD WO CONTRAST  Result Date: 10/22/2020 CLINICAL DATA:  Suspected stroke. EXAM: CT HEAD WITHOUT CONTRAST TECHNIQUE: Contiguous axial images were obtained from the base of the skull through the vertex without intravenous contrast. COMPARISON:  04/20/2021 FINDINGS: Brain: No evidence of acute infarction, hemorrhage, hydrocephalus, extra-axial collection or mass lesion/mass effect. Moderate brain parenchymal volume loss and deep white matter microangiopathy. 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 Vascular: Calcific atherosclerotic disease of the intra cavernous carotid arteries. Skull: Normal. Negative for fracture or focal lesion. Sinuses/Orbits: No acute finding. Other: None. IMPRESSION: 1. No acute intracranial abnormality. 2. Atrophy, chronic microvascular disease. Electronically Signed   By: Fidela Salisbury M.D.   On: 11/17/2020 14:26   DG Chest Port 1 View  Result Date: 10/28/2020 CLINICAL DATA:  80 year old male with central line placement. EXAM:  PORTABLE CHEST 1 VIEW COMPARISON:  Earlier chest radiograph dated 11/17/2020 and CT dated 11/09/2020 FINDINGS: Interval placement of a right IJ central venous line with tip close to the cavoatrial junction or over the right atrium. Endotracheal tube approximately 2 cm above the carina. The tube can be pulled back by 2 cm for optimal positioning. Enteric tube with tip in the left upper abdomen likely in the gastric fundus. Shallow inspiration with bilateral streaky densities similar to prior radiograph. No large pleural effusion or pneumothorax. Stable cardiomegaly. Atherosclerotic calcification of the aorta. No acute osseous pathology. IMPRESSION: 1. Interval placement of a right IJ central venous line with tip close to the cavoatrial junction. No pneumothorax. 2. Endotracheal tube above the carina. The tube can be pulled back by 2 cm for optimal positioning. 3. No interval change in bilateral pulmonary opacities. Electronically Signed   By: Anner Crete M.D.   On: 10/28/2020 15:27   DG Chest Portable 1 View  Result Date: 10/28/2020 CLINICAL DATA:  80 year old male status post intubation. EXAM: PORTABLE CHEST 1 VIEW COMPARISON:  Chest x-ray 06/28/2020. FINDINGS: An endotracheal tube is in place with tip 2.5 cm above the carina. A nasogastric tube is seen extending into the stomach, however, the tip of the nasogastric tube extends below the lower margin of the image. Transcutaneous defibrillator pads project over the lower left hemithorax. Lung volumes are low. Cephalization of the pulmonary vasculature. Airspace consolidation in  the right upper lobe and periphery of the right lung base. Left lung otherwise appears clear. No definite pleural effusions. No pneumothorax. Heart size is mildly enlarged. Upper mediastinal contours are distorted by patient positioning. Aortic atherosclerosis. IMPRESSION: 1. Support apparatus, as above. 2. Right upper lobe airspace consolidation and additional consolidative changes  in the right lung base concerning for multilobar pneumonia. 3. Cardiomegaly with pulmonary venous congestion. 4. Aortic atherosclerosis. Electronically Signed   By: Vinnie Langton M.D.   On: 10/28/2020 12:29   DG Abd Portable 1 View  Result Date: 11/02/2020 CLINICAL DATA:  80 year old male status post intubation. EXAM: PORTABLE ABDOMEN - 1 VIEW COMPARISON:  No priors. FINDINGS: Nasogastric tube in the mid stomach. IVC filter projecting over the right mid abdomen. Gas and stool are seen scattered throughout the colon extending to the level of the distal rectum. No pathologic distension of small bowel is noted. No gross evidence of pneumoperitoneum. IMPRESSION: 1. Support apparatus, as above. 2. Nonobstructive bowel gas pattern. Electronically Signed   By: Vinnie Langton M.D.   On: 11/01/2020 12:31   ECHOCARDIOGRAM COMPLETE  Result Date: 11/16/2020    ECHOCARDIOGRAM REPORT   Patient Name:   [facility] Date of Exam: 11/17/2020 Medical Rec #:  [id number]          Height:       70.5 in Accession #:    [id number]         Weight:       314.8 lb Date of Birth:  03/11/1941           BSA:          2.545 m Patient Age:    89 years           BP:           134/69 mmHg Patient Gender: M                  HR:           92 bpm. Exam Location:  [facility] Procedure: 2D Echo and Intracardiac Opacification Agent Indications:     Cardiac arrest I46.9  History:         Patient has no prior history of Echocardiogram examinations.  Sonographer:     Kathlen Brunswick RDCS Referring Phys:  [id number] Ottie Glazier Diagnosing Phys: Ida Rogue MD  Sonographer Comments: Technically challenging study due to limited acoustic windows, suboptimal subcostal window and echo performed with patient supine and on artificial respirator. Image acquisition challenging due to respiratory motion and Image acquisition challenging due to patient body habitus. IMPRESSIONS  1. Left ventricular ejection fraction, by estimation, is 60 to 65%. The left  ventricle has normal function. The left ventricle has no regional wall motion abnormalities. There is moderate left ventricular hypertrophy. Left ventricular diastolic parameters are consistent with Grade I diastolic dysfunction (impaired relaxation).  2. Right ventricular systolic function is normal. The right ventricular size is normal. Tricuspid regurgitation signal is inadequate for assessing PA pressure.  3. The mitral valve is normal in structure. No evidence of mitral valve regurgitation. No evidence of mitral stenosis. FINDINGS  Left Ventricle: Left ventricular ejection fraction, by estimation, is 60 to 65%. The left ventricle has normal function. The left ventricle has no regional wall motion abnormalities. Definity contrast agent was given IV to delineate the left ventricular  endocardial borders. The left ventricular internal cavity size was normal in size. There is moderate left ventricular hypertrophy. Left ventricular diastolic parameters are consistent  with Grade I diastolic dysfunction (impaired relaxation). Right Ventricle: The right ventricular size is normal. No increase in right ventricular wall thickness. Right ventricular systolic function is normal. Tricuspid regurgitation signal is inadequate for assessing PA pressure. Left Atrium: Left atrial size was normal in size. Right Atrium: Right atrial size was normal in size. Pericardium: There is no evidence of pericardial effusion. Mitral Valve: The mitral valve is normal in structure. No evidence of mitral valve regurgitation. No evidence of mitral valve stenosis. Tricuspid Valve: The tricuspid valve is normal in structure. Tricuspid valve regurgitation is mild . No evidence of tricuspid stenosis. Aortic Valve: The aortic valve is normal in structure. Aortic valve regurgitation is not visualized. No aortic stenosis is present. Aortic valve mean gradient measures 7.0 mmHg. Aortic valve peak gradient measures 14.7 mmHg. Pulmonic Valve: The pulmonic  valve was normal in structure. Pulmonic valve regurgitation is not visualized. No evidence of pulmonic stenosis. Aorta: The aortic root is normal in size and structure. Venous: The inferior vena cava is normal in size with greater than 50% respiratory variability, suggesting right atrial pressure of 3 mmHg. IAS/Shunts: No atrial level shunt detected by color flow Doppler.  LEFT VENTRICLE PLAX 2D LVIDd:         3.66 cm Diastology LVIDs:         2.60 cm LV e' medial:    9.03 cm/s LV PW:         1.34 cm LV E/e' medial:  7.6 LV IVS:        1.30 cm LV e' lateral:   11.30 cm/s                        LV E/e' lateral: 6.1  RIGHT VENTRICLE RV S prime:     13.75 cm/s TAPSE (M-mode): 3.7 cm LEFT ATRIUM           Index       RIGHT ATRIUM           Index LA diam:      4.00 cm 1.57 cm/m  RA Area:     13.10 cm LA Vol (A4C): 52.7 ml 20.71 ml/m RA Volume:   33.30 ml  13.09 ml/m  AORTIC VALVE AV Vmax:           191.50 cm/s AV Vmean:          121.000 cm/s AV VTI:            0.317 m AV Peak Grad:      14.7 mmHg AV Mean Grad:      7.0 mmHg LVOT Vmax:         131.00 cm/s LVOT Vmean:        83.700 cm/s LVOT VTI:          0.249 m LVOT/AV VTI ratio: 0.79 MITRAL VALVE               TRICUSPID VALVE MV Area (PHT): 3.68 cm    TV Peak grad:   29.1 mmHg MV Decel Time: 206 msec    TV Vmax:        2.70 m/s MV E velocity: 68.40 cm/s MV A velocity: 98.50 cm/s  SHUNTS MV E/A ratio:  0.69        Systemic VTI: 0.25 m Ida Rogue MD Electronically signed by Ida Rogue MD Signature Date/Time: 11/04/2020/4:34:13 PM    Final    CT CHEST ABDOMEN PELVIS WO CONTRAST  Result Date: 11/06/2020 CLINICAL DATA:  Pt arrived ems from home. Pt was at home and collapsed and CPR was started. 18 mins of CPR. 3 epi, 2 bicarb, 1 calcium. ROSC achieved. While en route, pt lost pulses again but after 2 round of CPR ROSC was obtained again with history of diabetes, peripheral vascular disease with nonhealing ulcers on the right lower extremity, hypertension,  congestive heart failure, BPH, history of DVT, history of COVID infection, dementia and chronic kidney disease stage IIIb with a baseline creatinine of about 2.4 as of 2 weeks ago. Patient sees Dr. Murlean Iba in the office. He was last seen in last month.As per the family while sitting in the car he became on responsive. The EMT came and did CPR. Patient was unresponsive for about 20 minutes finally he regained pulse. Patient is now intubated and sedated EXAM: CT CHEST, ABDOMEN AND PELVIS WITHOUT CONTRAST TECHNIQUE: Multidetector CT imaging of the chest, abdomen and pelvis was performed following the standard protocol without IV contrast. Examlimited by lack of intravenous and oral contrast and due to artifact from the arms being at patient's side. COMPARISON:  07/21/2020. FINDINGS: CT CHEST FINDINGS Cardiovascular: Heart is normal in size and configuration. No pericardial effusion. Great vessels normal in caliber. Mild aortic atherosclerosis. Mediastinum/Nodes: Endotracheal tube tip projects at the upper level of the carina. Nasal/orogastric tube passes below the diaphragm, well within the stomach. No neck base, axillary, mediastinal or hilar masses or enlarged lymph nodes. Trachea and esophagus are grossly unremarkable. Lungs/Pleura: Small pleural effusions. Bilateral patchy areas of ground-glass and more confluent opacity, most evident in the right upper lobe. Additional opacity is noted dependently in the lower lobes consistent with atelectasis. Mild interstitial thickening bilaterally. No mass. No suspicious nodule. No pneumothorax. Musculoskeletal: Subtle nondisplaced fractures of the anterior right 7 anterior left sixth ribs. No other fractures. No bone lesions. CT ABDOMEN PELVIS FINDINGS Hepatobiliary: Limited assessment of the from the liver and gallbladder due to artifact arms. Liver normal in size. No convincing mass. Gallbladder unremarkable. No evidence duct dilation. Pancreas: Assessment also  somewhat limited by artifact from the arms. No mass or inflammation. Spleen: Normal in size without focal abnormality. Adrenals/Urinary Tract: No adrenal masses. Kidneys normal in orientation and position. Areas of renal cortical thinning, likely scarring. No convincing renal mass, no stone and no hydronephrosis. Ureters not well visualized, but no evidence of a ureteral stone or ureteral dilation. Bladder is decompressed with a Foley catheter. Stomach/Bowel: Stomach is unremarkable. Small bowel and colon are normal in caliber. No wall thickening. No inflammation. Scattered left colon diverticula. Mild increase in the colonic stool burden. No evidence of appendicitis. Vascular/Lymphatic: Stable vena cava filter. Single tiny focus of aortic atherosclerotic calcification. No aneurysm. No enlarged lymph nodes. Reproductive: Unremarkable. Other: No abdominal wall hernia.  No ascites. Musculoskeletal: No fracture or acute finding.  No bone lesion. IMPRESSION: CT CHEST 1. Patchy bilateral ground-glass and confluent airspace opacities, most evident in the right upper lobe, which could be the result CPR, but is more suggestive of multifocal pneumonia. 2. Nondisplaced fractures of the anterior right seventh and anterior left sixth ribs presumably occurring during CPR. 3. Dependent opacity in both lower lobes consistent with atelectasis. Small effusions. ABDOMEN AND PELVIS CT 1. No acute findings within the abdomen or pelvis. Electronically Signed   By: Lajean Manes M.D.   On: 11/05/2020 14:30   _0 @ CT HEAD WO CONTRAST  Result Date: 10/27/2020 CLINICAL DATA:  Suspected stroke. EXAM: CT HEAD WITHOUT CONTRAST TECHNIQUE: Contiguous axial images were obtained  from the base of the skull through the vertex without intravenous contrast. COMPARISON:  04/20/2021 FINDINGS: Brain: No evidence of acute infarction, hemorrhage, hydrocephalus, extra-axial collection or mass lesion/mass effect. Moderate brain parenchymal volume  loss and deep white matter microangiopathy. 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 Vascular: Calcific atherosclerotic disease of the intra cavernous carotid arteries. Skull: Normal. Negative for fracture or focal lesion. Sinuses/Orbits: No acute finding. Other: None. IMPRESSION: 1. No acute intracranial abnormality. 2. Atrophy, chronic microvascular disease. Electronically Signed   By: Fidela Salisbury M.D.   On: 11/19/2020 14:26   DG Chest Port 1 View  Result Date: 11/06/2020 CLINICAL DATA:  80 year old male with central line placement. EXAM: PORTABLE CHEST 1 VIEW COMPARISON:  Earlier chest radiograph dated 11/13/2020 and CT dated 10/23/2020 FINDINGS: Interval placement of a right IJ central venous line with tip close to the cavoatrial junction or over the right atrium. Endotracheal tube approximately 2 cm above the carina. The tube can be pulled back by 2 cm for optimal positioning. Enteric tube with tip in the left upper abdomen likely in the gastric fundus. Shallow inspiration with bilateral streaky densities similar to prior radiograph. No large pleural effusion or pneumothorax. Stable cardiomegaly. Atherosclerotic calcification of the aorta. No acute osseous pathology. IMPRESSION: 1. Interval placement of a right IJ central venous line with tip close to the cavoatrial junction. No pneumothorax. 2. Endotracheal tube above the carina. The tube can be pulled back by 2 cm for optimal positioning. 3. No interval change in bilateral pulmonary opacities. Electronically Signed   By: Anner Crete M.D.   On: 10/21/2020 15:27   DG Chest Portable 1 View  Result Date: 11/17/2020 CLINICAL DATA:  79 year old male status post intubation. EXAM: PORTABLE CHEST 1 VIEW COMPARISON:  Chest x-ray 06/28/2020. FINDINGS: An endotracheal tube is in place with tip 2.5 cm above the carina. A nasogastric tube is seen extending into the stomach, however, the tip of the nasogastric tube  extends below the lower margin of the image. Transcutaneous defibrillator pads project over the lower left hemithorax. Lung volumes are low. Cephalization of the pulmonary vasculature. Airspace consolidation in the right upper lobe and periphery of the right lung base. Left lung otherwise appears clear. No definite pleural effusions. No pneumothorax. Heart size is mildly enlarged. Upper mediastinal contours are distorted by patient positioning. Aortic atherosclerosis. IMPRESSION: 1. Support apparatus, as above. 2. Right upper lobe airspace consolidation and additional consolidative changes in the right lung base concerning for multilobar pneumonia. 3. Cardiomegaly with pulmonary venous congestion. 4. Aortic atherosclerosis. Electronically Signed   By: Vinnie Langton M.D.   On: 11/14/2020 12:29   DG Abd Portable 1 View  Result Date: 11/06/2020 CLINICAL DATA:  80 year old male status post intubation. EXAM: PORTABLE ABDOMEN - 1 VIEW COMPARISON:  No priors. FINDINGS: Nasogastric tube in the mid stomach. IVC filter projecting over the right mid abdomen. Gas and stool are seen scattered throughout the colon extending to the level of the distal rectum. No pathologic distension of small bowel is noted. No gross evidence of pneumoperitoneum. IMPRESSION: 1. Support apparatus, as above. 2. Nonobstructive bowel gas pattern. Electronically Signed   By: Vinnie Langton M.D.   On: 11/19/2020 12:31   ECHOCARDIOGRAM COMPLETE  Result Date: 10/21/2020    ECHOCARDIOGRAM REPORT   Patient Name:   [facility] Date of Exam: 11/12/2020 Medical Rec #:  [id number]          Height:  70.5 in Accession #:    [id number]         Weight:       314.8 lb Date of Birth:  October 19, 1940           BSA:          2.545 m Patient Age:    12 years           BP:           134/69 mmHg Patient Gender: M                  HR:           92 bpm. Exam Location:  [facility] Procedure: 2D Echo and Intracardiac Opacification Agent Indications:     Cardiac  arrest I46.9  History:         Patient has no prior history of Echocardiogram examinations.  Sonographer:     Kathlen Brunswick RDCS Referring Phys:  [id number] Ottie Glazier Diagnosing Phys: Ida Rogue MD  Sonographer Comments: Technically challenging study due to limited acoustic windows, suboptimal subcostal window and echo performed with patient supine and on artificial respirator. Image acquisition challenging due to respiratory motion and Image acquisition challenging due to patient body habitus. IMPRESSIONS  1. Left ventricular ejection fraction, by estimation, is 60 to 65%. The left ventricle has normal function. The left ventricle has no regional wall motion abnormalities. There is moderate left ventricular hypertrophy. Left ventricular diastolic parameters are consistent with Grade I diastolic dysfunction (impaired relaxation).  2. Right ventricular systolic function is normal. The right ventricular size is normal. Tricuspid regurgitation signal is inadequate for assessing PA pressure.  3. The mitral valve is normal in structure. No evidence of mitral valve regurgitation. No evidence of mitral stenosis. FINDINGS  Left Ventricle: Left ventricular ejection fraction, by estimation, is 60 to 65%. The left ventricle has normal function. The left ventricle has no regional wall motion abnormalities. Definity contrast agent was given IV to delineate the left ventricular  endocardial borders. The left ventricular internal cavity size was normal in size. There is moderate left ventricular hypertrophy. Left ventricular diastolic parameters are consistent with Grade I diastolic dysfunction (impaired relaxation). Right Ventricle: The right ventricular size is normal. No increase in right ventricular wall thickness. Right ventricular systolic function is normal. Tricuspid regurgitation signal is inadequate for assessing PA pressure. Left Atrium: Left atrial size was normal in size. Right Atrium: Right atrial size was  normal in size. Pericardium: There is no evidence of pericardial effusion. Mitral Valve: The mitral valve is normal in structure. No evidence of mitral valve regurgitation. No evidence of mitral valve stenosis. Tricuspid Valve: The tricuspid valve is normal in structure. Tricuspid valve regurgitation is mild . No evidence of tricuspid stenosis. Aortic Valve: The aortic valve is normal in structure. Aortic valve regurgitation is not visualized. No aortic stenosis is present. Aortic valve mean gradient measures 7.0 mmHg. Aortic valve peak gradient measures 14.7 mmHg. Pulmonic Valve: The pulmonic valve was normal in structure. Pulmonic valve regurgitation is not visualized. No evidence of pulmonic stenosis. Aorta: The aortic root is normal in size and structure. Venous: The inferior vena cava is normal in size with greater than 50% respiratory variability, suggesting right atrial pressure of 3 mmHg. IAS/Shunts: No atrial level shunt detected by color flow Doppler.  LEFT VENTRICLE PLAX 2D LVIDd:         3.66 cm Diastology LVIDs:         2.60 cm LV e' medial:  9.03 cm/s LV PW:         1.34 cm LV E/e' medial:  7.6 LV IVS:        1.30 cm LV e' lateral:   11.30 cm/s                        LV E/e' lateral: 6.1  RIGHT VENTRICLE RV S prime:     13.75 cm/s TAPSE (M-mode): 3.7 cm LEFT ATRIUM           Index       RIGHT ATRIUM           Index LA diam:      4.00 cm 1.57 cm/m  RA Area:     13.10 cm LA Vol (A4C): 52.7 ml 20.71 ml/m RA Volume:   33.30 ml  13.09 ml/m  AORTIC VALVE AV Vmax:           191.50 cm/s AV Vmean:          121.000 cm/s AV VTI:            0.317 m AV Peak Grad:      14.7 mmHg AV Mean Grad:      7.0 mmHg LVOT Vmax:         131.00 cm/s LVOT Vmean:        83.700 cm/s LVOT VTI:          0.249 m LVOT/AV VTI ratio: 0.79 MITRAL VALVE               TRICUSPID VALVE MV Area (PHT): 3.68 cm    TV Peak grad:   29.1 mmHg MV Decel Time: 206 msec    TV Vmax:        2.70 m/s MV E velocity: 68.40 cm/s MV A velocity: 98.50  cm/s  SHUNTS MV E/A ratio:  0.69        Systemic VTI: 0.25 m Ida Rogue MD Electronically signed by Ida Rogue MD Signature Date/Time: 11/12/2020/4:34:13 PM    Final    CT CHEST ABDOMEN PELVIS WO CONTRAST  Result Date: 11/02/2020 CLINICAL DATA:  Pt arrived ems from home. Pt was at home and collapsed and CPR was started. 18 mins of CPR. 3 epi, 2 bicarb, 1 calcium. ROSC achieved. While en route, pt lost pulses again but after 2 round of CPR ROSC was obtained again with history of diabetes, peripheral vascular disease with nonhealing ulcers on the right lower extremity, hypertension, congestive heart failure, BPH, history of DVT, history of COVID infection, dementia and chronic kidney disease stage IIIb with a baseline creatinine of about 2.4 as of 2 weeks ago. Patient sees Dr. Murlean Iba in the office. He was last seen in last month.As per the family while sitting in the car he became on responsive. The EMT came and did CPR. Patient was unresponsive for about 20 minutes finally he regained pulse. Patient is now intubated and sedated EXAM: CT CHEST, ABDOMEN AND PELVIS WITHOUT CONTRAST TECHNIQUE: Multidetector CT imaging of the chest, abdomen and pelvis was performed following the standard protocol without IV contrast. Examlimited by lack of intravenous and oral contrast and due to artifact from the arms being at patient's side. COMPARISON:  07/21/2020. FINDINGS: CT CHEST FINDINGS Cardiovascular: Heart is normal in size and configuration. No pericardial effusion. Great vessels normal in caliber. Mild aortic atherosclerosis. Mediastinum/Nodes: Endotracheal tube tip projects at the upper level of the carina. Nasal/orogastric tube passes below the diaphragm, well within the  stomach. No neck base, axillary, mediastinal or hilar masses or enlarged lymph nodes. Trachea and esophagus are grossly unremarkable. Lungs/Pleura: Small pleural effusions. Bilateral patchy areas of ground-glass and more confluent opacity,  most evident in the right upper lobe. Additional opacity is noted dependently in the lower lobes consistent with atelectasis. Mild interstitial thickening bilaterally. No mass. No suspicious nodule. No pneumothorax. Musculoskeletal: Subtle nondisplaced fractures of the anterior right 7 anterior left sixth ribs. No other fractures. No bone lesions. CT ABDOMEN PELVIS FINDINGS Hepatobiliary: Limited assessment of the from the liver and gallbladder due to artifact arms. Liver normal in size. No convincing mass. Gallbladder unremarkable. No evidence duct dilation. Pancreas: Assessment also somewhat limited by artifact from the arms. No mass or inflammation. Spleen: Normal in size without focal abnormality. Adrenals/Urinary Tract: No adrenal masses. Kidneys normal in orientation and position. Areas of renal cortical thinning, likely scarring. No convincing renal mass, no stone and no hydronephrosis. Ureters not well visualized, but no evidence of a ureteral stone or ureteral dilation. Bladder is decompressed with a Foley catheter. Stomach/Bowel: Stomach is unremarkable. Small bowel and colon are normal in caliber. No wall thickening. No inflammation. Scattered left colon diverticula. Mild increase in the colonic stool burden. No evidence of appendicitis. Vascular/Lymphatic: Stable vena cava filter. Single tiny focus of aortic atherosclerotic calcification. No aneurysm. No enlarged lymph nodes. Reproductive: Unremarkable. Other: No abdominal wall hernia.  No ascites. Musculoskeletal: No fracture or acute finding.  No bone lesion. IMPRESSION: CT CHEST 1. Patchy bilateral ground-glass and confluent airspace opacities, most evident in the right upper lobe, which could be the result CPR, but is more suggestive of multifocal pneumonia. 2. Nondisplaced fractures of the anterior right seventh and anterior left sixth ribs presumably occurring during CPR. 3. Dependent opacity in both lower lobes consistent with atelectasis. Small  effusions. ABDOMEN AND PELVIS CT 1. No acute findings within the abdomen or pelvis. Electronically Signed   By: Lajean Manes M.D.   On: 11/09/2020 14:30     ASSESSMENT AND PLAN    -Multidisciplinary rounds held today  Acute hypoxemic respiratory failure - present on admission  - COVID19- pending - on mechanical ventilation  - will perform infectious workup for pneumonia -Respiratory viral panel -serum fungitell -legionella ab -strep pneumoniae ur AG -Histoplasma Ur Ag -sputum resp cultures -AFB sputum expectorated specimen -sputum cytology  -reviewed pertinent imaging with patient today - ESR -PT/OT for d/c planning  -please encourage patient to use incentive spirometer few times each hour while hospitalized.     Cardiac Arrest PEA - ACLS w ROSC   - cardiology consult - reviewed patient with cardiology team - [place] sun initated -holding xarelto  -holding asa  -CT head Stat.  -follow up cardiac enzymes as indicated ICU monitoring   Severe anemia   - possible due to acute blood loss  -transfuse blood - patient does not have religions restrictions   - MCV 60s suggestive of possible underlying thalasemia/ssD   -G6PD testing   - haptoglobin   -pathology slide review   - CKD 4 - may be EPO candidate -heme/onc evaluation  Renal Failure-Acute on chronic stage -4 -follow chem 7 -follow UO -continue Foley Catheter-assess need daily -Nephrology on case - Dr Lanora Manis - -appreciate input +casts   Acutely comatose state - intubated emergently on mechanical ventilation  -will check CT head for ICH - minimal sedation to achieve a RASS goal: -1 Wake up assessment pending   ID -continue IV abx as prescibed-empirically treating for  infection -follow up cultures  GI/Nutrition GI PROPHYLAXIS as indicated DIET-->TF's as tolerated Constipation protocol as indicated  ENDO - ICU hypoglycemic\Hyperglycemia protocol -check FSBS per  protocol   ELECTROLYTES -follow labs as needed -replace as needed -pharmacy consultation   DVT/GI PRX ordered -SCDs  TRANSFUSIONS AS NEEDED MONITOR FSBS ASSESS the need for LABS as needed   Critical care provider statement:    Critical care time (minutes):  33   Critical care time was exclusive of:  Separately billable procedures and treating other patients   Critical care was necessary to treat or prevent imminent or life-threatening deterioration of the following conditions:  acutely comatose, cardiac arrest, Acute on chronic renal failure stage 4   Critical care was time spent personally by me on the following activities:  Development of treatment plan with patient or surrogate, discussions with consultants, evaluation of patient's response to treatment, examination of patient, obtaining history from patient or surrogate, ordering and performing treatments and interventions, ordering and review of laboratory studies and re-evaluation of patient's condition.  I assumed direction of critical care for this patient from another provider in my specialty: no    This document was prepared using Dragon voice recognition software and may include unintentional dictation errors.    Ottie Glazier, M.D.  Division of [place]

## 2020-11-19 NOTE — Plan of Care (Signed)
PMT note:  Patient on ventilator. No family at bedside. Wife has doctor's appointment's tomorrow but daughter with whom he lives will be here in the morning. Will follos up in the morning.

## 2020-11-19 NOTE — Progress Notes (Signed)
Neurology Progress Note HPI from initial consult note Patient is comatose and unable to provide hx. Per ICU H&P: "This is 80 year old male with a history of CKD 4, chronic venous insufficiency, DVT on Xarelto, peripheral artery disease, CHF seen by [facility] last transthoracic echo done December 2021, morbid obesity, obstructive sleep apnea on CPAP device at home, type 2 diabetes, Lewy body dementia with parkinsonism, chronic anemia history of total knee replacement, seizure disorder came in status post cardiac arrest. Received ACLS 20 minutes with ROSC. Lost pulse again and required ACLS second time followed by ROSC required endotracheal intubation in the ER. Medical intensive care unit admission post cardiac arrest.  Evaluated patient with nephrologist together at the bedside. Family was present during my evaluation. They deny flulike illness malaise, myalgia he did previously have COVID-19 and recovered to baseline. Additional review of systems reveals decreased appetite over 48 hours. Denies diarrhea nausea vomiting or any additional symptoms. Labs show chronic anemia, CKD metabolic acidosis with high anion gap. Vitals without hypotension or shock. On exam GCS 4 T with plan to perform CT head to rule out intracerebral hemorrhage followed by Arctic sun cooling protocol post cardiac arrest." CTH was unremarkable. When I evaluated patient at bedside he had rhythmic twitching of his right face and RUE as well as intermittent oral automatisms. He was loaded with keppra and continued on maintenance 519m q 12 hrs (renal dosing) and started on versed gtt.  S:// Seen and examined overnight. No acute events. Getting PRBC transfusions - 5 of 6 underway, planned 6 total. Hgb 6.5  O:// Current vital signs: BP (!) 122/59   Pulse 63   Temp (!) 96.8 F (36 C) (Bladder)   Resp 20   Ht 5' 10.5" (1.791 m)   Wt (!) 142.8 kg   SpO2 95%   BMI 44.53 kg/m  Vital signs in last 24 hours: Temp:  [93 F  (33.9 C)-96.8 F (36 C)] 96.8 F (36 C) (05/30 0819) Pulse Rate:  [41-99] 63 (05/30 0800) Resp:  [13-30] 20 (05/30 0800) BP: (62-145)/(47-92) 122/59 (05/30 0819) SpO2:  [95 %-100 %] 95 % (05/30 0800) FiO2 (%):  [28 %-100 %] 28 % (05/30 0705) Weight:  [142.8 kg] 142.8 kg (05/29 1414) Gen: sedated intubated, on TTM HEENT: [place] AT CVS: RRR Resp: vented, diminished breath sounds all over Abd: ND NT Ext: + non-pitting edema NEUROLOGICAL EXAMINATION MS: sedated intubated, no spontaneous movements, not following commands, non verbal CN: PERRL, +corneals, + breathing over the vent, gaze midline Motor: no spontaneous movement. To noxious stimulation, opens eyes, attempts flexion of both arms, no movement on b/l LE Sensory as above Coord: can not assess due to mentation  Medications  Current Facility-Administered Medications:  .  0.9 %  sodium chloride infusion (Manually program via Guardrails IV Fluids), , Intravenous, Once, Rust-Chester, Britton L, NP .  0.9 %  sodium chloride infusion, , Intravenous, Continuous, Rust-Chester, Britton L, NP, Last Rate: 10 mL/hr at 11/17/2020 2130, New Bag at 11/06/2020 2130 .  bacitracin ointment, , Topical, BID, Rust-Chester, Britton L, NP .  chlorhexidine gluconate (MEDLINE KIT) (PERIDEX) 0.12 % solution 15 mL, 15 mL, Mouth Rinse, BID, ALanney Gins Fuad, MD, 15 mL at 11/19/20 0819 .  Chlorhexidine Gluconate Cloth 2 % PADS 6 each, 6 each, Topical, Q0600, ALanney Gins Fuad, MD .  dexmedetomidine (PRECEDEX) 400 MCG/100ML (4 mcg/mL) infusion, 0-1.2 mcg/kg/hr, Intravenous, Continuous, JBlake Divine MD, Stopped at 10/27/2020 1921 .  docusate sodium (COLACE) capsule 100 mg, 100 mg, Oral, BID PRN,  Ottie Glazier, MD .  fentaNYL (SUBLIMAZE) bolus via infusion 50-100 mcg, 50-100 mcg, Intravenous, Q2H PRN, Rust-Chester, Britton L, NP .  fentaNYL 2574mg in NS 2536m(1035mml) infusion-PREMIX, 100-300 mcg/hr, Intravenous, Continuous, Rust-Chester, Britton L, NP, Last Rate:  15 mL/hr at 11/19/20 0616, 150 mcg/hr at 11/19/20 0616 .  [COMPLETED] levETIRAcetam (KEPPRA) IVPB 1000 mg/100 mL premix, 1,000 mg, Intravenous, Once, Stopped at 10/28/2020 1848 **FOLLOWED BY** levETIRAcetam (KEPPRA) IVPB 500 mg/100 mL premix, 500 mg, Intravenous, Q12H, StaDerek JackD, Stopped at 11/19/20 [id number] MEDLINE mouth rinse, 15 mL, Mouth Rinse, 10 times per day, AleOttie GlazierD, 15 mL at 11/19/20 0620 .  midazolam (VERSED) 50 mg in dextrose 5 % 50 mL (1 mg/mL) infusion, 0.5-10 mg/hr, Intravenous, Continuous, AleLanney Ginsuad, MD, Last Rate: 2 mL/hr at 11/19/20 0616, 2 mg/hr at 11/19/20 0616 .  midazolam (VERSED) bolus via infusion 2-4 mg, 2-4 mg, Intravenous, Q1H PRN, Rust-Chester, Britton L, NP .  norepinephrine (LEVOPHED) 4mg26m 250mL62mmix infusion, 0-50 mcg/min, Intravenous, Titrated, Rust-Chester, Britton L, NP, Last Rate: 26.3 mL/hr at 11/19/20 0837, 7 mcg/min at 11/19/20 0837 .  pantoprazole (PROTONIX) injection 40 mg, 40 mg, Intravenous, QHS, Rust-Chester, Britton L, NP, 40 mg at 11/19/2020 2234 .  piperacillin-tazobactam (ZOSYN) IVPB 3.375 g, 3.375 g, Intravenous, Q8H, GrubbDallie Piles, Last Rate: 12.5 mL/hr at 11/19/20 0615,3244usion Verify at 11/19/20 0615 .  polyethylene glycol (MIRALAX / GLYCOLAX) packet 17 g, 17 g, Oral, Daily PRN, AleskLanney Ginsd, MD .  sodium zirconium cyclosilicate (LOKELMA) packet 10 g, 10 g, Oral, TID, Beers, BrandShanon Brow, 10 g at 11/19/20 0335 .  vancomycin variable dose per unstable renal function (pharmacist dosing), , Does not apply, See admin instructions, GrubbDallie Piles  [facility] CBC    Component Value Date/Time   WBC 11.4 (H) 11/19/2020 0459   RBC 2.36 (L) 11/19/2020 0459   HGB 6.5 (L) 11/19/2020 0459   HGB 12.6 (L) 10/23/2012 0519   HCT 18.9 (L) 11/19/2020 0459   HCT 39.4 (L) 10/23/2012 0519   PLT 108 (L) 11/19/2020 0459   PLT 174 10/23/2012 0519   MCV 80.1 11/19/2020 0459   MCV 72 (L) 10/23/2012 0519   MCH 27.5 11/19/2020  0459   MCHC 34.4 11/19/2020 0459   RDW 22.4 (H) 11/19/2020 0459   RDW 18.1 (H) 10/23/2012 0519   LYMPHSABS 0.9 11/11/2020 1336   LYMPHSABS 1.6 10/23/2012 0519   MONOABS 0.4 11/17/2020 1336   MONOABS 0.5 10/23/2012 0519   EOSABS 0.0 11/09/2020 1336   EOSABS 0.1 10/23/2012 0519   BASOSABS 0.0 11/08/2020 1336   BASOSABS 0.0 10/23/2012 0519   CMP     Component Value Date/Time   NA 145 11/19/2020 0459   NA 140 10/23/2012 0519   K 5.2 (H) 11/19/2020 0459   K 4.4 10/23/2012 0519   CL 115 (H) 11/19/2020 0459   CL 109 (H) 10/23/2012 0519   CO2 22 11/19/2020 0459   CO2 24 10/23/2012 0519   GLUCOSE 123 (H) 11/19/2020 0459   GLUCOSE 144 (H) 10/23/2012 0519   BUN 112 (H) 11/19/2020 0459   BUN 26 (H) 10/23/2012 0519   CREATININE 3.60 (H) 11/19/2020 0459   CREATININE 1.73 (H) 10/23/2012 0519   CALCIUM 8.0 (L) 11/19/2020 0459   CALCIUM 9.6 10/23/2012 0519   PROT 5.6 (L) 10/30/2020 1207   PROT 7.9 09/02/2011 1539   ALBUMIN 2.8 (L) 11/13/2020 1207   ALBUMIN 3.7 09/02/2011 1539   AST 179 (H)  10/27/2020 1207   AST 46 (H) 09/02/2011 1539   ALT 159 (H) 11/04/2020 1207   ALT 53 09/02/2011 1539   ALKPHOS 108 11/08/2020 1207   ALKPHOS 74 09/02/2011 1539   BILITOT 0.5 11/09/2020 1207   BILITOT 0.4 09/02/2011 1539   GFRNONAA 17 (L) 11/19/2020 0459   GFRNONAA 39 (L) 10/23/2012 0519   GFRAA 36 (L) 12/07/2019 1652   GFRAA 45 (L) 10/23/2012 0519   Imaging I have reviewed images in epic and the results pertinent to this consultation are: CT head with no acute changes.  Assessment: 80 year old man with history of CKD 4, chronic venous insufficiency, DVT on Xarelto, peripheral arterial disease, CHF, morbid obesity, obstructive sleep apnea on CPAP at home, type 2 diabetes, Lewy body dementia with parkinsonism, chronic anemia with history of total knee replacement came in status post cardiac arrest.  He had a critical low hemoglobin- 2.5 without a clear source of bleeding.  On neurological  examination he had focal twitching of the right face and arm with concern for focal seizure activity.  Also had some orofacial automatisms. Chart review reveals that there is a history of seizure disorder and he is apparently on Keppra as an outpatient 500 twice daily-he is unable to tolerate higher dose of Keppra due to deranged renal function. His Keppra was continued along with Versed and recommendations to add propofol if needed. No seizure activity has been noted since yesterday. He is actively receiving blood transfusions with the most recent hemoglobin 6.5.  Given such severe anemia, there is always concern for ischemic stroke-embolic and watershed etiology due to hypoperfusion.  Given the history of seizures, acute illness could also have lowered seizure threshold.  Both EEG and MRI would be helpful for further evaluation  Impression: Witnessed focal seizure activity post cardiac arrest Severe symptomatic anemia requiring blood transfusion without obvious source of bleeding History of seizures  Recommendations: MRI of the brain without contrast when stable enough for transport unable to do. Routine EEG Continue home Keppra He is on Versed at 4 mg/h.  Can continue that but attempt to reduce sedation as tolerated. Neurological prognostication no sooner than 72 hours post rewarming. Attempt to get further seizure history from the family. If the routine EEG is concerning for seizures, will treat more aggressively and will consider transfer to a continuous EEG monitoring center if he is otherwise stabilized.  Currently critical due to symptomatic anemia and requiring blood transfusions along with pressors. Plan discussed with the PCCM MD Dr Lanney Gins on the unit.  CRITICAL CARE ATTESTATION Performed by: Amie Portland, MD Total critical care time: 37mnutes Critical care time was exclusive of separately billable procedures and treating other patients and/or supervising  APPs/Residents/Students Critical care was necessary to treat or prevent imminent or life-threatening deterioration due to concern for seizure activity, severe symptomatic anemia with concern for strokes. This patient is critically ill and at significant risk for neurological worsening and/or death and care requires constant monitoring. Critical care was time spent personally by me on the following activities: development of treatment plan with patient and/or surrogate as well as nursing, discussions with consultants, evaluation of patient's response to treatment, examination of patient, obtaining history from patient or surrogate, ordering and performing treatments and interventions, ordering and review of laboratory studies, ordering and review of radiographic studies, pulse oximetry, re-evaluation of patient's condition, participation in multidisciplinary rounds and medical decision making of high complexity in the care of this patient.

## 2020-11-19 NOTE — Progress Notes (Signed)
PHARMACY CONSULT NOTE  Pharmacy Consult for Electrolyte Monitoring and Replacement   Recent Labs: Potassium (mmol/L)  Date Value  11/19/2020 5.2 (H)  10/23/2012 4.4   Magnesium (mg/dL)  Date Value  11/19/2020 2.4  09/02/2011 1.7 (L)   Calcium (mg/dL)  Date Value  11/19/2020 8.0 (L)   Calcium, Total (mg/dL)  Date Value  10/23/2012 9.6   Albumin (g/dL)  Date Value  10/31/2020 2.8 (L)  09/02/2011 3.7   Phosphorus (mg/dL)  Date Value  11/19/2020 5.9 (H)   Sodium (mmol/L)  Date Value  11/19/2020 145  10/23/2012 140   Corrected Ca: 8.96 mg/dL  Assessment: 80 y.o. male with a hx of CKD, HFpEF, BPH, hypertension, dementia, DVT on Xarelto admitted on 11/04/2020 s/p cardiac arrest. Received ACLS 20 minutes with ROSC. Lost pulse again and required ACLS second time followed by ROSC required endotracheal intubation in the ER. Pharmacy has been asked to follow electrolytes and replace as needed. He has noted renal function impairment  Goal of Therapy:  Potassium 4.0 - 5.1 mmol/L Magnesium 2.0 - 2.4 mg/dL All Other Electrolytes WNL  Plan:   MD has ordered Lokelma 10 grams per tube TID  MD has also ordered q12h BMP: next 1700  Continue to follow along with CCU medical team to optimize electrolytes  Dallie Piles ,PharmD Clinical Pharmacist 11/19/2020 11:56 AM

## 2020-11-19 NOTE — Consult Note (Signed)
NEUROLOGY CONSULTATION NOTE   Date of service: Nov 19, 2020 Patient Name: Justin Shannon. MRN:  [id number] DOB:  22-Apr-1941 Reason for consult: prognostication after cardiac arrest _ _ _   _ __   _ __ _ _  __ __   _ __   __ _  History of Present Illness   Patient is comatose and unable to provide hx. Per ICU H&P: "This is 80 year old male with a history of CKD 4, chronic venous insufficiency, DVT on Xarelto, peripheral artery disease, CHF seen by [facility] last transthoracic echo done December 2021, morbid obesity, obstructive sleep apnea on CPAP device at home, type 2 diabetes, Lewy body dementia with parkinsonism, chronic anemia history of total knee replacement, seizure disorder came in status post cardiac arrest.  Received ACLS 20 minutes with ROSC.  Lost pulse again and required ACLS second time followed by ROSC required endotracheal intubation in the ER.  Medical intensive care unit admission post cardiac arrest.    Evaluated patient with nephrologist together at the bedside.  Family was present during my evaluation.  They deny flulike illness malaise, myalgia he did previously have COVID-19 and recovered to baseline.  Additional review of systems reveals decreased appetite over 48 hours.  Denies diarrhea nausea vomiting or any additional symptoms.  Labs show chronic anemia, CKD metabolic acidosis with high anion gap.  Vitals without hypotension or shock.  On exam GCS 4 T with plan to perform CT head to rule out intracerebral hemorrhage followed by Arctic sun cooling protocol post cardiac arrest."  CTH was unremarkable. When I evaluated patient at bedside he had rhythmic twitching of his right face and RUE as well as intermittent oral automatisms. He was loaded with keppra and continued on maintenance 500mg  q 12 hrs (renal dosing) and started on versed gtt.   ROS   UTA 2/2 encephalopathy  Past History   Past Medical History:  Diagnosis Date  . (HFpEF) heart failure with preserved  ejection fraction ([place])    a. 05/2020 Echo: EF >55%, Gr2 DD. Mildly enlarged RV. Mild LAE. Mild MR.  . Arthritis   . Bilateral swelling of feet   . BPH (benign prostatic hyperplasia)   . CKD (chronic kidney disease), stage III ([place])   . COVID-19 virus infection 06/2019  . Dementia ([place])    a. requires 24 hr care  . Essential hypertension   . Gout   . Left Foot Tendonitis   . Right Lower Extremity DVT (deep venous thrombosis) ([place])    a. On Xarelto; s/p IVC filter.  . Seizures ([place])    a. on keppra  . Sleep apnea    Past Surgical History:  Procedure Laterality Date  . APPENDECTOMY    . APPLICATION OF WOUND VAC Right 11/02/2019   Procedure: APPLICATION OF WOUND VAC;  Surgeon: Dereck Leep, MD;  Location: [facility] ORS;  Service: Orthopedics;  Laterality: Right;  [id number]  . HERNIA REPAIR     UMBILICAL  . IVC FILTER INSERTION N/A 10/25/2019   Procedure: IVC FILTER INSERTION;  Surgeon: Katha Cabal, MD;  Location: [place] CV LAB;  Service: Cardiovascular;  Laterality: N/A;  . KNEE ARTHROPLASTY Right 11/02/2019   Procedure: COMPUTER ASSISTED TOTAL KNEE ARTHROPLASTY;  Surgeon: Dereck Leep, MD;  Location: [facility] ORS;  Service: Orthopedics;  Laterality: Right;  . PATELLAR TENDON REPAIR Right 12/05/2019   Procedure: PATELLA TENDON REPAIR;  Surgeon: Dereck Leep, MD;  Location: [facility] ORS;  Service: Orthopedics;  Laterality: Right;  .  PROSTATE SURGERY     Family History  Problem Relation Age of Onset  . Arthritis Mother   . Hypertension Mother   . Diabetes Brother    Social History   Socioeconomic History  . Marital status: Married    Spouse name: Not on file  . Number of children: Not on file  . Years of education: Not on file  . Highest education level: Not on file  Occupational History  . Not on file  Tobacco Use  . Smoking status: Former Smoker    Packs/day: 1.00    Years: 10.00    Pack years: 10.00    Types: Cigarettes    Quit date: 06/23/1968    Years since  quitting: 52.4  . Smokeless tobacco: Never Used  Vaping Use  . Vaping Use: Never used  Substance and Sexual Activity  . Alcohol use: No  . Drug use: No  . Sexual activity: Not on file  Other Topics Concern  . Not on file  Social History Narrative  . Not on file   Social Determinants of Health   Financial Resource Strain: Low Risk   . Difficulty of Paying Living Expenses: Not hard at all  Food Insecurity: No Food Insecurity  . Worried About Charity fundraiser in the Last Year: Never true  . Ran Out of Food in the Last Year: Never true  Transportation Needs: No Transportation Needs  . Lack of Transportation (Medical): No  . Lack of Transportation (Non-Medical): No  Physical Activity: Inactive  . Days of Exercise per Week: 0 days  . Minutes of Exercise per Session: 0 min  Stress: No Stress Concern Present  . Feeling of Stress : Only a little  Social Connections: Moderately Isolated  . Frequency of Communication with Friends and Family: More than three times a week  . Frequency of Social Gatherings with Friends and Family: More than three times a week  . Attends Religious Services: Never  . Active Member of Clubs or Organizations: No  . Attends Archivist Meetings: Not on file  . Marital Status: Married   Allergies  Allergen Reactions  . Shellfish Allergy Anaphylaxis and Other (See Comments)    gout  . Iodinated Diagnostic Agents Other (See Comments)    Chronic kidney disease 3b  . Nsaids Other (See Comments)    Chronic kidney disease 3b  . Rivastigmine Other (See Comments)    Side effect and not a true allergy    Medications   Medications Prior to Admission  Medication Sig Dispense Refill Last Dose  . albuterol (VENTOLIN HFA) 108 (90 Base) MCG/ACT inhaler Inhale 2 puffs into the lungs every 4 (four) hours as needed for wheezing.   11/08/2020 at prn  . allopurinol (ZYLOPRIM) 100 MG tablet Take 100 mg by mouth daily.   11/10/2020 at Unknown time  .  atorvastatin (LIPITOR) 20 MG tablet Take 1 tablet (20 mg total) by mouth at bedtime. 90 tablet 3 11/17/2020 at 2000  . fexofenadine (ALLEGRA) 180 MG tablet Take 180 mg by mouth daily as needed for allergies.    Past Week at prn  . finasteride (PROSCAR) 5 MG tablet Take 1 tablet (5 mg total) by mouth daily. 30 tablet 11 11/02/2020 at 0830  . fluticasone (FLONASE) 50 MCG/ACT nasal spray Place 2 sprays into both nostrils daily as needed for allergies.    Past Week at prn  . ketoconazole (NIZORAL) 2 % cream Apply to aa's groin QD. 60 g 2  11/06/2020 at 0830  . levETIRAcetam (KEPPRA) 500 MG tablet Take 500 mg by mouth 2 (two) times daily.    11/17/2020 at 0830  . Magnesium 250 MG TABS Take 250 mg by mouth daily.   11/14/2020 at 0900  . metoprolol succinate (TOPROL-XL) 25 MG 24 hr tablet Take 1 tablet (25 mg total) by mouth daily. 30 tablet 6 11/06/2020 at 0830  . Multiple Vitamin (ONE-A-DAY MENS PO) Take 1 tablet by mouth daily.    10/22/2020 at 0900  . rivaroxaban (XARELTO) 10 MG TABS tablet TAKE ONE TABLET BY MOUTH EVERY DAY WITH BREAKFAST (Patient taking differently: Take 10 mg by mouth daily.) 90 tablet 3 11/15/2020 at 0830  . torsemide (DEMADEX) 20 MG tablet Take 1 tablet (20 mg total) by mouth daily. Take 1 tab by mouth every Mon/Wed/Fri/Sat (4 days a week) 30 tablet 3 11/17/2020 at 0830  . docusate sodium (COLACE) 100 MG capsule Take 100 mg by mouth daily as needed for mild constipation or moderate constipation.    unknown at prn  . fluconazole (DIFLUCAN) 200 MG tablet Take one tab po QD on Monday, Wednesday, and Friday 12 tablet 1 11/16/2020  . LORazepam (ATIVAN) 0.5 MG tablet Take 0.5 mg by mouth every 4 (four) hours as needed.   unknown at prn     Vitals   Vitals:   11/19/20 0400 11/19/20 0456 11/19/20 0500 11/19/20 0600  BP: 112/60     Pulse: (!) 58     Resp: (!) 21     Temp: (!) 96.8 F (36 C) (!) 96.8 F (36 C) (!) 96.8 F (36 C) (!) 96.8 F (36 C)  TempSrc: Bladder Bladder Bladder Bladder   SpO2: 100%     Weight:      Height:         Body mass index is 44.53 kg/m.  Physical Exam   Physical Exam Gen: A&O x4, NAD Resp: ventilated CV: RRR  Neuro:  MS: unresponsive to noxious stimuli CN: PERRL, (+) corneals, (-) oculocephalics, cough, gag Motor & sensory: no response to noxious stimuli in any extremity Reflexes: 1+ symmetric throughout except 0 bilat achilles, toes mute bilat   Labs   CBC:  Recent Labs  Lab 10/30/2020 1336 10/30/2020 1812 11/19/20 0459  WBC 13.5*  --  11.4*  NEUTROABS 12.0*  --   --   HGB 2.5* 4.5* 6.5*  HCT 7.9* 14.1* 18.9*  MCV 68.1*  --  80.1  PLT 151  --  108*    Basic Metabolic Panel:  Lab Results  Component Value Date   NA 145 11/19/2020   K 5.2 (H) 11/19/2020   CO2 22 11/19/2020   GLUCOSE 123 (H) 11/19/2020   BUN 112 (H) 11/19/2020   CREATININE 3.60 (H) 11/19/2020   CALCIUM 8.0 (L) 11/19/2020   GFRNONAA 17 (L) 11/19/2020   GFRAA 36 (L) 12/07/2019   Lipid Panel:  Lab Results  Component Value Date   LDLCALC 23 06/05/2020   HgbA1c:  Lab Results  Component Value Date   HGBA1C 5.5 08/22/2020   Urine Drug Screen:     Component Value Date/Time   LABOPIA NEGATIVE 09/02/2012 0435   COCAINSCRNUR NEGATIVE 09/02/2012 0435   LABBENZ POSITIVE 09/02/2012 0435   AMPHETMU NEGATIVE 09/02/2012 0435   THCU NEGATIVE 09/02/2012 0435   LABBARB NEGATIVE 09/02/2012 0435    Alcohol Level No results found for: [facility]   Impression   80 year old male with a history of CKD 4, chronic venous insufficiency, DVT on  Xarelto, peripheral artery disease, CHF seen by [facility] last transthoracic echo done December 2021, morbid obesity, obstructive sleep apnea on CPAP device at home, type 2 diabetes, Lewy body dementia with parkinsonism, chronic anemia history of total knee replacement, came in status post cardiac arrest. On examination he had focal twitching of R face and arm c/f focal seizure activity. Upon further review of his medical records patient  is noted to have chart hx of seizure disorder and he is apparently already on LEV 500mg  bid as o/p. He is unable to tolerate higher dose of LEV at this time given his renal function.   Recommendations   - Continue LEV 500mg  bid + versed gtt, add propofol if needed - rEEG AM - Clarify sz hx with family - low threshold to transfer to [facility] for cEEG monitoring - Too early to perform prognostication exam - Consider MRI brain when medically stabilized  Will continue to follow ______________________________________________________________________   Thank you for the opportunity to take part in the care of this patient. If you have any further questions, please contact the neurology consultation attending.  Signed,  Su Monks, MD [facility] [phone]  If 7pm- 7am, please page neurology on call as listed in [place].

## 2020-11-19 NOTE — Plan of Care (Signed)
Patient found unresponsive at home, to ED via [place]. ROSC obtained after 18 minutes of ACLS. Patient lost pulses a 2nd time in route to the ED and ROSC obtained after 2 rounds of CPR. Patient admitted to ICU bed 16 for Targeted Temperature Management.

## 2020-11-19 NOTE — Consult Note (Signed)
[place] NOTE  Patient Care Team: [name], DO as PCP - General (Family Medicine) Kate Sable, MD as PCP - Cardiology (Cardiology) Jason Coop, NP as Nurse Practitioner (Hospice and Palliative Medicine) Marry Guan, Laurice Record, MD as Consulting Physician (Orthopedic Surgery)  CHIEF COMPLAINTS/PURPOSE OF CONSULTATION:  Severe anemia.  HISTORY OF PRESENTING ILLNESS:  Justin Shannon. 80 y.o.  male with multiple medical problems including CKD stage IV history of DVT on Xarelto peripheral artery disease CHF/morbid obesity obstructive sleep apnea type 2 diabetes labile dementia with Parkinson's chronic anemia/history of seizures is brought to the hospital s/p cardiac arrest.  Patient received ACLS CPR x2; needing endotracheal intubation in the ER.   On admission to hospital on May 29 creatinine 3.1 at baseline around 2-2.5 elevated AST ALT 179/ 159.  Hemoglobin 2.5 MCV 67.  Platelets 153 white count 12.7.  Patient received 6 units of PRBC transfusion.  Patient is currently intubated sedated.;  Currently on norepinephrine.  Suspected seizures in ICU-s/p evaluation with neurology.   Review of Systems  Unable to perform ROS: Intubated     MEDICAL HISTORY:  Past Medical History:  Diagnosis Date  . (HFpEF) heart failure with preserved ejection fraction ([place])    a. 05/2020 Echo: EF >55%, Gr2 DD. Mildly enlarged RV. Mild LAE. Mild MR.  . Arthritis   . Bilateral swelling of feet   . BPH (benign prostatic hyperplasia)   . CKD (chronic kidney disease), stage III ([place])   . COVID-19 virus infection 06/2019  . Dementia ([place])    a. requires 24 hr care  . Essential hypertension   . Gout   . Left Foot Tendonitis   . Right Lower Extremity DVT (deep venous thrombosis) ([place])    a. On Xarelto; s/p IVC filter.  . Seizures ([place])    a. on keppra  . Sleep apnea     SURGICAL HISTORY: Past Surgical History:  Procedure Laterality Date  .  APPENDECTOMY    . APPLICATION OF WOUND VAC Right 11/02/2019   Procedure: APPLICATION OF WOUND VAC;  Surgeon: Dereck Leep, MD;  Location: [facility] OR[facility];  Service: Orthopedics;  Laterality: Right;  [id number]  . HERNIA REPAIR     UMBILICAL  . IVC FILTER INSERTION N/A 10/25/2019   Procedure: IVC FILTER INSERTION;  Surgeon: Katha Cabal, MD;  Location: [employer];  Service: Cardiovascular;  Laterality: N/A;  . KNEE ARTHROPLASTY Right 11/02/2019   Procedure: COMPUTER ASSISTED TOTAL KNEE ARTHROPLASTY;  Surgeon: Dereck Leep, MD;  Location: [facility] OR[facility];  Service: Orthopedics;  Laterality: Right;  . PATELLAR TENDON REPAIR Right 12/05/2019   Procedure: PATELLA TENDON REPAIR;  Surgeon: Dereck Leep, MD;  Location: [facility] ORS;  Service: Orthopedics;  Laterality: Right;  . PROSTATE SURGERY      SOCIAL HISTORY: Social History   Socioeconomic History  . Marital status: Married    Spouse name: Not on file  . Number of children: Not on file  . Years of education: Not on file  . Highest education level: Not on file  Occupational History  . Not on file  Tobacco Use  . Smoking status: Former Smoker    Packs/day: 1.00    Years: 10.00    Pack years: 10.00    Types: Cigarettes    Quit date: 06/23/1968    Years since quitting: 52.4  . Smokeless tobacco: Never Used  Vaping Use  . Vaping Use: Never used  Substance and Sexual Activity  .  Alcohol use: No  . Drug use: No  . Sexual activity: Not on file  Other Topics Concern  . Not on file  Social History Narrative  . Not on file   Social Determinants of Health   Financial Resource Strain: Low Risk   . Difficulty of Paying Living Expenses: Not hard at all  Food Insecurity: No Food Insecurity  . Worried About Charity fundraiser in the Last Year: Never true  . Ran Out of Food in the Last Year: Never true  Transportation Needs: No Transportation Needs  . Lack of Transportation (Medical): No  . Lack of Transportation (Non-Medical): No   Physical Activity: Inactive  . Days of Exercise per Week: 0 days  . Minutes of Exercise per Session: 0 min  Stress: No Stress Concern Present  . Feeling of Stress : Only a little  Social Connections: Moderately Isolated  . Frequency of Communication with Friends and Family: More than three times a week  . Frequency of Social Gatherings with Friends and Family: More than three times a week  . Attends Religious Services: Never  . Active Member of Clubs or Organizations: No  . Attends Archivist Meetings: Not on file  . Marital Status: Married  Human resources officer Violence: Not At Risk  . Fear of Current or Ex-Partner: No  . Emotionally Abused: No  . Physically Abused: No  . Sexually Abused: No    FAMILY HISTORY: Family History  Problem Relation Age of Onset  . Arthritis Mother   . Hypertension Mother   . Diabetes Brother     ALLERGIES:  is allergic to shellfish allergy, iodinated diagnostic agents, nsaids, and rivastigmine.  MEDICATIONS:  Current Facility-Administered Medications  Medication Dose Route Frequency Provider Last Rate Last Admin  . 0.9 %  sodium chloride infusion   Intravenous Continuous Rust-Chester, Huel Cote, NP   Stopped at 10/24/2020 2232  . bacitracin ointment   Topical BID Rust-Chester, Huel Cote, NP   Given at 11/19/20 2226  . chlorhexidine gluconate (MEDLINE KIT) (PERIDEX) 0.12 % solution 15 mL  15 mL Mouth Rinse BID Ottie Glazier, MD   15 mL at 11/19/20 1939  . Chlorhexidine Gluconate Cloth 2 % PADS 6 each  6 each Topical Q0600 Ottie Glazier, MD   6 each at 11/19/20 1002  . docusate sodium (COLACE) capsule 100 mg  100 mg Oral BID PRN Ottie Glazier, MD      . fentaNYL (SUBLIMAZE) bolus via infusion 50-100 mcg  50-100 mcg Intravenous Q2H PRN Rust-Chester, Huel Cote, NP   50 mcg at 11/19/20 2211  . fentaNYL 2554mg in NS 2541m(106mml) infusion-PREMIX  100-300 mcg/hr Intravenous Continuous Rust-Chester, Britton L, NP 15 mL/hr at 11/19/20 2200 150  mcg/hr at 11/19/20 2200  . levETIRAcetam (KEPPRA) IVPB 500 mg/100 mL premix  500 mg Intravenous Q12H StaDerek JackD   Stopped at 11/19/20 1623  . MEDLINE mouth rinse  15 mL Mouth Rinse 10 times per day AleOttie GlazierD   15 mL at 11/19/20 2205  . midazolam (VERSED) 50 mg in dextrose 5 % 50 mL (1 mg/mL) infusion  0.5-10 mg/hr Intravenous Continuous AleOttie GlazierD 4 mL/hr at 11/19/20 2200 4 mg/hr at 11/19/20 2200  . midazolam (VERSED) bolus via infusion 2-4 mg  2-4 mg Intravenous Q1H PRN Rust-Chester, Britton L, NP      . norepinephrine (LEVOPHED) 4mg21m 250mL62mmix infusion  0-50 mcg/min Intravenous Titrated Rust-Chester, Britton L, NP 30 mL/hr at 11/19/20 2200  8 mcg/min at 11/19/20 2200  . pantoprazole (PROTONIX) injection 40 mg  40 mg Intravenous QHS Rust-Chester, Britton L, NP   40 mg at 11/19/20 2204  . polyethylene glycol (MIRALAX / GLYCOLAX) packet 17 g  17 g Per Tube Daily PRN Ottie Glazier, MD      . sodium zirconium cyclosilicate (LOKELMA) packet 10 g  10 g Per Tube TID Ottie Glazier, MD   10 g at 11/19/20 2204      .  PHYSICAL EXAMINATION:  Vitals:   11/19/20 2114 11/19/20 2200  BP: (!) 131/57 (!) 133/56  Pulse: 85 86  Resp: (!) 27 (!) 32  Temp: 98.6 F (37 C) 98.6 F (37 C)  SpO2: 100% 100%   Filed Weights   11/17/2020 1414  Weight: (!) 142.8 kg    Physical Exam Constitutional:      Comments: Patient is sedated intubated.  HENT:     Head: Normocephalic and atraumatic.     Mouth/Throat:     Pharynx: No oropharyngeal exudate.  Cardiovascular:     Rate and Rhythm: Normal rate and regular rhythm.  Pulmonary:     Effort: No respiratory distress.     Breath sounds: No wheezing.     Comments: Decreased air entry bilaterally. Abdominal:     General: Bowel sounds are normal. There is no distension.     Palpations: Abdomen is soft. There is no mass.     Tenderness: There is no abdominal tenderness. There is no guarding or rebound.  Musculoskeletal:         General: No tenderness. Normal range of motion.     Cervical back: Normal range of motion and neck supple.  Skin:    General: Skin is warm.  Neurological:     Comments: Patient sedated.  Psychiatric:        Mood and Affect: Affect normal.      LABORATORY DATA:  I have reviewed the data as listed Lab Results  Component Value Date   WBC 11.4 (H) 11/19/2020   HGB 8.4 (L) 11/19/2020   HCT 18.9 (L) 11/19/2020   MCV 80.1 11/19/2020   PLT 108 (L) 11/19/2020   Recent Labs    12/07/19 1652 06/04/20 1323 06/28/20 0818 07/21/20 2023 07/21/20 2107 10/23/2020 1207 11/06/2020 2032 11/19/20 0459 11/19/20 1647  NA 138   < > 139 138   < > 144 144 145 145  K 3.4*   < > 5.4* 6.1*   < > 4.8 5.6* 5.2* 4.5  CL 104   < > 108 102   < > 111 112* 115* 112*  CO2 27   < > 23 23   < > 19* _0 GLUCOSE 103*   < > 95 88   < > 175* 144* 123* 100*  BUN 52*   < > 46* 34*   < > 99* 106* 112* 105*  CREATININE 2.02*   < > 1.72* 2.03*   < > 3.18* 3.42* 3.60* 3.75*  CALCIUM 9.2   < > 10.0 10.2   < > 9.0 8.3* 8.0* 8.1*  GFRNONAA 31*   < > 40* 33*   < > 19* 18* 17* 16*  GFRAA 36*  --   --   --   --   --   --   --   --   PROT  --    < > 8.1 8.0  --  5.6*  --   --   --  ALBUMIN  --    < > 3.9 3.8  --  2.8*  --   --   --   AST  --    < > 36 49*  --  179*  --   --   --   ALT  --    < > 38 30  --  159*  --   --   --   ALKPHOS  --    < > 103 93  --  108  --   --   --   BILITOT  --    < > 0.6 0.9  --  0.5  --   --   --    < > = values in this interval not displayed.    RADIOGRAPHIC STUDIES: I have personally reviewed the radiological images as listed and agreed with the findings in the report. CT HEAD WO CONTRAST  Result Date: 11/04/2020 CLINICAL DATA:  Suspected stroke. EXAM: CT HEAD WITHOUT CONTRAST TECHNIQUE: Contiguous axial images were obtained from the base of the skull through the vertex without intravenous contrast. COMPARISON:  04/20/2021 FINDINGS: Brain: No evidence of acute infarction,  hemorrhage, hydrocephalus, extra-axial collection or mass lesion/mass effect. Moderate brain parenchymal volume loss and deep white matter microangiopathy. 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 Vascular: Calcific atherosclerotic disease of the intra cavernous carotid arteries. Skull: Normal. Negative for fracture or focal lesion. Sinuses/Orbits: No acute finding. Other: None. IMPRESSION: 1. No acute intracranial abnormality. 2. Atrophy, chronic microvascular disease. Electronically Signed   By: Fidela Salisbury M.D.   On: 11/02/2020 14:26   DG Chest Port 1 View  Result Date: 10/30/2020 CLINICAL DATA:  80 year old male with central line placement. EXAM: PORTABLE CHEST 1 VIEW COMPARISON:  Earlier chest radiograph dated 11/09/2020 and CT dated 11/20/2020 FINDINGS: Interval placement of a right IJ central venous line with tip close to the cavoatrial junction or over the right atrium. Endotracheal tube approximately 2 cm above the carina. The tube can be pulled back by 2 cm for optimal positioning. Enteric tube with tip in the left upper abdomen likely in the gastric fundus. Shallow inspiration with bilateral streaky densities similar to prior radiograph. No large pleural effusion or pneumothorax. Stable cardiomegaly. Atherosclerotic calcification of the aorta. No acute osseous pathology. IMPRESSION: 1. Interval placement of a right IJ central venous line with tip close to the cavoatrial junction. No pneumothorax. 2. Endotracheal tube above the carina. The tube can be pulled back by 2 cm for optimal positioning. 3. No interval change in bilateral pulmonary opacities. Electronically Signed   By: Anner Crete M.D.   On: 11/15/2020 15:27   DG Chest Portable 1 View  Result Date: 10/26/2020 CLINICAL DATA:  80 year old male status post intubation. EXAM: PORTABLE CHEST 1 VIEW COMPARISON:  Chest x-ray 06/28/2020. FINDINGS: An endotracheal tube is in place with tip 2.5 cm  above the carina. A nasogastric tube is seen extending into the stomach, however, the tip of the nasogastric tube extends below the lower margin of the image. Transcutaneous defibrillator pads project over the lower left hemithorax. Lung volumes are low. Cephalization of the pulmonary vasculature. Airspace consolidation in the right upper lobe and periphery of the right lung base. Left lung otherwise appears clear. No definite pleural effusions. No pneumothorax. Heart size is mildly enlarged. Upper mediastinal contours are distorted by patient positioning. Aortic atherosclerosis. IMPRESSION: 1. Support apparatus, as above. 2. Right upper lobe airspace consolidation and additional consolidative changes in the  right lung base concerning for multilobar pneumonia. 3. Cardiomegaly with pulmonary venous congestion. 4. Aortic atherosclerosis. Electronically Signed   By: Vinnie Langton M.D.   On: 11/19/2020 12:29   DG Abd Portable 1 View  Result Date: 10/23/2020 CLINICAL DATA:  80 year old male status post intubation. EXAM: PORTABLE ABDOMEN - 1 VIEW COMPARISON:  No priors. FINDINGS: Nasogastric tube in the mid stomach. IVC filter projecting over the right mid abdomen. Gas and stool are seen scattered throughout the colon extending to the level of the distal rectum. No pathologic distension of small bowel is noted. No gross evidence of pneumoperitoneum. IMPRESSION: 1. Support apparatus, as above. 2. Nonobstructive bowel gas pattern. Electronically Signed   By: Vinnie Langton M.D.   On: 11/02/2020 12:31   EEG adult  Result Date: 11/19/2020 Lora Havens, MD     11/19/2020  3:21 PM Patient Name: Justin Shannon. MRN: [id number] Epilepsy Attending: Lora Havens Referring Physician/Provider: Olga Millers, NP Date: 11/19/2020 Duration: 22.29 mins Patient history: 80yo M with witnessed focal seizure activity post cardiac arrest. EEG to evaluate for seizure Level of alertness: comatose AEDs during EEG  study: LEV, versed Technical aspects: This EEG study was done with scalp electrodes positioned according to the 10-20 International system of electrode placement. Electrical activity was acquired at a sampling rate of 500Hz and reviewed with a high frequency filter of 70Hz and a low frequency filter of 1Hz. EEG data were recorded continuously and digitally stored. Description: EEG showed near continuous generalized 3 to 6 Hz theta-delta slowing with brief periods of generalized attenuation lasting 1-3 seconds. Hyperventilation and photic stimulation were not performed.   ABNORMALITY - Continuous slow, generalized IMPRESSION: This study is suggestive of severe diffuse encephalopathy, nonspecific etiology. No seizures or epileptiform discharges were seen throughout the recording. Lora Havens   ECHOCARDIOGRAM COMPLETE  Result Date: 11/07/2020    ECHOCARDIOGRAM REPORT   Patient Name:   [facility]. Date of Exam: 11/10/2020 Medical Rec #:  [id number]          Height:       70.5 in Accession #:    [id number]         Weight:       314.8 lb Date of Birth:  Jan 06, 1941           BSA:          2.545 m Patient Age:    36 years           BP:           134/69 mmHg Patient Gender: M                  HR:           92 bpm. Exam Location:  [facility] Procedure: 2D Echo and Intracardiac Opacification Agent Indications:     Cardiac arrest I46.9  History:         Patient has no prior history of Echocardiogram examinations.  Sonographer:     Kathlen Brunswick RDCS Referring Phys:  [id number] Ottie Glazier Diagnosing Phys: Ida Rogue MD  Sonographer Comments: Technically challenging study due to limited acoustic windows, suboptimal subcostal window and echo performed with patient supine and on artificial respirator. Image acquisition challenging due to respiratory motion and Image acquisition challenging due to patient body habitus. IMPRESSIONS  1. Left ventricular ejection fraction, by estimation, is 60 to 65%. The left ventricle  has normal function. The left ventricle has no regional wall motion abnormalities.  There is moderate left ventricular hypertrophy. Left ventricular diastolic parameters are consistent with Grade I diastolic dysfunction (impaired relaxation).  2. Right ventricular systolic function is normal. The right ventricular size is normal. Tricuspid regurgitation signal is inadequate for assessing PA pressure.  3. The mitral valve is normal in structure. No evidence of mitral valve regurgitation. No evidence of mitral stenosis. FINDINGS  Left Ventricle: Left ventricular ejection fraction, by estimation, is 60 to 65%. The left ventricle has normal function. The left ventricle has no regional wall motion abnormalities. Definity contrast agent was given IV to delineate the left ventricular  endocardial borders. The left ventricular internal cavity size was normal in size. There is moderate left ventricular hypertrophy. Left ventricular diastolic parameters are consistent with Grade I diastolic dysfunction (impaired relaxation). Right Ventricle: The right ventricular size is normal. No increase in right ventricular wall thickness. Right ventricular systolic function is normal. Tricuspid regurgitation signal is inadequate for assessing PA pressure. Left Atrium: Left atrial size was normal in size. Right Atrium: Right atrial size was normal in size. Pericardium: There is no evidence of pericardial effusion. Mitral Valve: The mitral valve is normal in structure. No evidence of mitral valve regurgitation. No evidence of mitral valve stenosis. Tricuspid Valve: The tricuspid valve is normal in structure. Tricuspid valve regurgitation is mild . No evidence of tricuspid stenosis. Aortic Valve: The aortic valve is normal in structure. Aortic valve regurgitation is not visualized. No aortic stenosis is present. Aortic valve mean gradient measures 7.0 mmHg. Aortic valve peak gradient measures 14.7 mmHg. Pulmonic Valve: The pulmonic valve was  normal in structure. Pulmonic valve regurgitation is not visualized. No evidence of pulmonic stenosis. Aorta: The aortic root is normal in size and structure. Venous: The inferior vena cava is normal in size with greater than 50% respiratory variability, suggesting right atrial pressure of 3 mmHg. IAS/Shunts: No atrial level shunt detected by color flow Doppler.  LEFT VENTRICLE PLAX 2D LVIDd:         3.66 cm Diastology LVIDs:         2.60 cm LV e' medial:    9.03 cm/s LV PW:         1.34 cm LV E/e' medial:  7.6 LV IVS:        1.30 cm LV e' lateral:   11.30 cm/s                        LV E/e' lateral: 6.1  RIGHT VENTRICLE RV S prime:     13.75 cm/s TAPSE (M-mode): 3.7 cm LEFT ATRIUM           Index       RIGHT ATRIUM           Index LA diam:      4.00 cm 1.57 cm/m  RA Area:     13.10 cm LA Vol (A4C): 52.7 ml 20.71 ml/m RA Volume:   33.30 ml  13.09 ml/m  AORTIC VALVE AV Vmax:           191.50 cm/s AV Vmean:          121.000 cm/s AV VTI:            0.317 m AV Peak Grad:      14.7 mmHg AV Mean Grad:      7.0 mmHg LVOT Vmax:         131.00 cm/s LVOT Vmean:        83.700 cm/s LVOT VTI:  0.249 m LVOT/AV VTI ratio: 0.79 MITRAL VALVE               TRICUSPID VALVE MV Area (PHT): 3.68 cm    TV Peak grad:   29.1 mmHg MV Decel Time: 206 msec    TV Vmax:        2.70 m/s MV E velocity: 68.40 cm/s MV A velocity: 98.50 cm/s  SHUNTS MV E/A ratio:  0.69        Systemic VTI: 0.25 m Ida Rogue MD Electronically signed by Ida Rogue MD Signature Date/Time: 10/22/2020/4:34:13 PM    Final    CT CHEST ABDOMEN PELVIS WO CONTRAST  Result Date: 10/22/2020 CLINICAL DATA:  Pt arrived ems from home. Pt was at home and collapsed and CPR was started. 18 mins of CPR. 3 epi, 2 bicarb, 1 calcium. ROSC achieved. While en route, pt lost pulses again but after 2 round of CPR ROSC was obtained again with history of diabetes, peripheral vascular disease with nonhealing ulcers on the right lower extremity, hypertension, congestive  heart failure, BPH, history of DVT, history of COVID infection, dementia and chronic kidney disease stage IIIb with a baseline creatinine of about 2.4 as of 2 weeks ago. Patient sees Dr. Murlean Iba in the office. He was last seen in last month.As per the family while sitting in the car he became on responsive. The EMT came and did CPR. Patient was unresponsive for about 20 minutes finally he regained pulse. Patient is now intubated and sedated EXAM: CT CHEST, ABDOMEN AND PELVIS WITHOUT CONTRAST TECHNIQUE: Multidetector CT imaging of the chest, abdomen and pelvis was performed following the standard protocol without IV contrast. Examlimited by lack of intravenous and oral contrast and due to artifact from the arms being at patient's side. COMPARISON:  07/21/2020. FINDINGS: CT CHEST FINDINGS Cardiovascular: Heart is normal in size and configuration. No pericardial effusion. Great vessels normal in caliber. Mild aortic atherosclerosis. Mediastinum/Nodes: Endotracheal tube tip projects at the upper level of the carina. Nasal/orogastric tube passes below the diaphragm, well within the stomach. No neck base, axillary, mediastinal or hilar masses or enlarged lymph nodes. Trachea and esophagus are grossly unremarkable. Lungs/Pleura: Small pleural effusions. Bilateral patchy areas of ground-glass and more confluent opacity, most evident in the right upper lobe. Additional opacity is noted dependently in the lower lobes consistent with atelectasis. Mild interstitial thickening bilaterally. No mass. No suspicious nodule. No pneumothorax. Musculoskeletal: Subtle nondisplaced fractures of the anterior right 7 anterior left sixth ribs. No other fractures. No bone lesions. CT ABDOMEN PELVIS FINDINGS Hepatobiliary: Limited assessment of the from the liver and gallbladder due to artifact arms. Liver normal in size. No convincing mass. Gallbladder unremarkable. No evidence duct dilation. Pancreas: Assessment also somewhat limited  by artifact from the arms. No mass or inflammation. Spleen: Normal in size without focal abnormality. Adrenals/Urinary Tract: No adrenal masses. Kidneys normal in orientation and position. Areas of renal cortical thinning, likely scarring. No convincing renal mass, no stone and no hydronephrosis. Ureters not well visualized, but no evidence of a ureteral stone or ureteral dilation. Bladder is decompressed with a Foley catheter. Stomach/Bowel: Stomach is unremarkable. Small bowel and colon are normal in caliber. No wall thickening. No inflammation. Scattered left colon diverticula. Mild increase in the colonic stool burden. No evidence of appendicitis. Vascular/Lymphatic: Stable vena cava filter. Single tiny focus of aortic atherosclerotic calcification. No aneurysm. No enlarged lymph nodes. Reproductive: Unremarkable. Other: No abdominal wall hernia.  No ascites. Musculoskeletal: No fracture or acute  finding.  No bone lesion. IMPRESSION: CT CHEST 1. Patchy bilateral ground-glass and confluent airspace opacities, most evident in the right upper lobe, which could be the result CPR, but is more suggestive of multifocal pneumonia. 2. Nondisplaced fractures of the anterior right seventh and anterior left sixth ribs presumably occurring during CPR. 3. Dependent opacity in both lower lobes consistent with atelectasis. Small effusions. ABDOMEN AND PELVIS CT 1. No acute findings within the abdomen or pelvis. Electronically Signed   By: Lajean Manes M.D.   On: 10/27/2020 14:30    Severe anemia #80 year old male patient with multiple comorbidities-including CKD/CHF-is currently admitted to hospital-cardiac arrest s/p CPR noted to have severe anemia  #Severe anemia hemoglobin 2.5 [on admission]-s/p 6 units of PRBC; hemoglobin 8.4.  Etiology of anemia is unclear-however appears acute on chronic iron deficiency.  No obvious evidence of GI loss; CT abdomen pelvis negative for any etiology for blood loss  #CKD stage IV;  CHF/cardiac arrest status post CPR; s/p intubation-sedated/pressors  # Recommendations:  #Recommend checking iron studies; LDH haptoglobin.  Agree with PRBC transfusion.  #If patient is iron deficient, and if acute issues resolve patient will be considered for further work-up for iron deficiency-including GI work-up.  Thank you Dr.Aleskerov for allowing me to participate in the care of your pleasant patient. Please do not hesitate to contact me with questions or concerns in the interim.   All questions were answered. The patient knows to call the clinic with any problems, questions or concerns.    Cammie Sickle, MD 11/19/2020 10:34 PM

## 2020-11-19 NOTE — Progress Notes (Signed)
eeg done °

## 2020-11-19 NOTE — Procedures (Signed)
Patient Name: Justin Shannon.  MRN: [id number]  Epilepsy Attending: Lora Havens  Referring Physician/Provider: Olga Millers, NP Date: 11/19/2020 Duration: 22.29 mins  Patient history: 80yo M with witnessed focal seizure activity post cardiac arrest. EEG to evaluate for seizure  Level of alertness: comatose  AEDs during EEG study: LEV, versed  Technical aspects: This EEG study was done with scalp electrodes positioned according to the 10-20 International system of electrode placement. Electrical activity was acquired at a sampling rate of 500Hz  and reviewed with a high frequency filter of 70Hz  and a low frequency filter of 1Hz . EEG data were recorded continuously and digitally stored.   Description: EEG showed near continuous generalized 3 to 6 Hz theta-delta slowing with brief periods of generalized attenuation lasting 1-3 seconds. Hyperventilation and photic stimulation were not performed.     ABNORMALITY - Continuous slow, generalized  IMPRESSION: This study is suggestive of severe diffuse encephalopathy, nonspecific etiology. No seizures or epileptiform discharges were seen throughout the recording.  Justin Shannon Barbra Sarks

## 2020-11-20 DIAGNOSIS — Z7189 Other specified counseling: Secondary | ICD-10-CM

## 2020-11-20 LAB — BPAM RBC
Blood Product Expiration Date: 202206232359
Blood Product Expiration Date: 202206282359
Blood Product Expiration Date: 202207032359
Blood Product Expiration Date: 202207032359
Blood Product Expiration Date: 202207032359
Blood Product Expiration Date: 202207032359
ISSUE DATE / TIME: 202205291640
ISSUE DATE / TIME: 202205291720
ISSUE DATE / TIME: 202205292110
ISSUE DATE / TIME: 202205300010
ISSUE DATE / TIME: 202205300754
ISSUE DATE / TIME: 202205301326
Unit Type and Rh: 1700
Unit Type and Rh: 5100
Unit Type and Rh: 5100
Unit Type and Rh: 5100
Unit Type and Rh: 5100
Unit Type and Rh: 7300

## 2020-11-20 LAB — TYPE AND SCREEN
ABO/RH(D): B POS
Antibody Screen: NEGATIVE
Unit division: 0
Unit division: 0
Unit division: 0
Unit division: 0
Unit division: 0
Unit division: 0

## 2020-11-20 LAB — BASIC METABOLIC PANEL
Anion gap: 9 (ref 5–15)
BUN: 98 mg/dL — ABNORMAL HIGH (ref 8–23)
CO2: 22 mmol/L (ref 22–32)
Calcium: 8.1 mg/dL — ABNORMAL LOW (ref 8.9–10.3)
Chloride: 116 mmol/L — ABNORMAL HIGH (ref 98–111)
Creatinine, Ser: 3.83 mg/dL — ABNORMAL HIGH (ref 0.61–1.24)
GFR, Estimated: 15 mL/min — ABNORMAL LOW (ref >60)
Glucose, Bld: 86 mg/dL (ref 70–99)
Potassium: 4.1 mmol/L (ref 3.5–5.1)
Sodium: 147 mmol/L — ABNORMAL HIGH (ref 135–145)

## 2020-11-20 LAB — CBC
HCT: 21.9 % — ABNORMAL LOW (ref 39.0–52.0)
Hemoglobin: 7.3 g/dL — ABNORMAL LOW (ref 13.0–17.0)
MCH: 26.8 pg (ref 26.0–34.0)
MCHC: 33.3 g/dL (ref 30.0–36.0)
MCV: 80.5 fL (ref 80.0–100.0)
Platelets: 92 10*3/uL — ABNORMAL LOW (ref 150–400)
RBC: 2.72 MIL/uL — ABNORMAL LOW (ref 4.22–5.81)
RDW: 21.8 % — ABNORMAL HIGH (ref 11.5–15.5)
WBC: 11 10*3/uL — ABNORMAL HIGH (ref 4.0–10.5)
nRBC: 0.9 % — ABNORMAL HIGH (ref 0.0–0.2)

## 2020-11-20 LAB — GLUCOSE, CAPILLARY
Glucose-Capillary: 131 mg/dL — ABNORMAL HIGH (ref 70–99)
Glucose-Capillary: 81 mg/dL (ref 70–99)
Glucose-Capillary: 84 mg/dL (ref 70–99)
Glucose-Capillary: 89 mg/dL (ref 70–99)
Glucose-Capillary: 75 mg/dL (ref 70–99)
Glucose-Capillary: 69 mg/dL — ABNORMAL LOW (ref 70–99)

## 2020-11-20 LAB — HEMOGLOBIN AND HEMATOCRIT, BLOOD
HCT: 21.8 % — ABNORMAL LOW (ref 39.0–52.0)
HCT: 21.1 % — ABNORMAL LOW (ref 39.0–52.0)
HCT: 21.4 % — ABNORMAL LOW (ref 39.0–52.0)
Hemoglobin: 7.4 g/dL — ABNORMAL LOW (ref 13.0–17.0)
Hemoglobin: 7 g/dL — ABNORMAL LOW (ref 13.0–17.0)
Hemoglobin: 7.1 g/dL — ABNORMAL LOW (ref 13.0–17.0)

## 2020-11-20 LAB — CULTURE, RESPIRATORY W GRAM STAIN: Culture: NORMAL

## 2020-11-20 LAB — URINE CULTURE: Culture: NO GROWTH

## 2020-11-20 LAB — LEGIONELLA PNEUMOPHILA SEROGP 1 UR AG: L. pneumophila Serogp 1 Ur Ag: NEGATIVE

## 2020-11-20 LAB — PHOSPHORUS: Phosphorus: 4.6 mg/dL (ref 2.5–4.6)

## 2020-11-20 LAB — PATHOLOGIST SMEAR REVIEW

## 2020-11-20 LAB — HAPTOGLOBIN: Haptoglobin: 103 mg/dL (ref 34–355)

## 2020-11-20 LAB — MAGNESIUM: Magnesium: 1.9 mg/dL (ref 1.7–2.4)

## 2020-11-20 MED ORDER — DEXTROSE 50 % IV SOLN
50.0000 mL | Freq: Once | INTRAVENOUS | Status: AC
  Administered 2020-11-20: 50 mL via INTRAVENOUS
  Filled 2020-11-20: qty 50

## 2020-11-20 NOTE — Progress Notes (Signed)
[facility] for Electrolyte Monitoring and Replacement   Recent Labs: Potassium (mmol/L)  Date Value  11/20/2020 4.1  10/23/2012 4.4   Magnesium (mg/dL)  Date Value  11/20/2020 1.9  09/02/2011 1.7 (L)   Calcium (mg/dL)  Date Value  11/20/2020 8.1 (L)   Calcium, Total (mg/dL)  Date Value  10/23/2012 9.6   Albumin (g/dL)  Date Value  11/01/2020 2.8 (L)  09/02/2011 3.7   Phosphorus (mg/dL)  Date Value  11/20/2020 4.6   Sodium (mmol/L)  Date Value  11/20/2020 147 (H)  10/23/2012 140   Corrected Ca: 8.96 mg/dL  Assessment: 80 y.o. male with a hx of CKD, HFpEF, BPH, hypertension, dementia, DVT on Xarelto admitted on 11/15/2020 s/p cardiac arrest. Received ACLS 20 minutes with ROSC. Lost pulse again and required ACLS second time followed by ROSC. Patient required intubation in the ER. Patient undergoing TTM, with normothermia achieved 5/30 at 2114. Neurology is following the patient. Pharmacy has been asked to follow electrolytes and replace as needed. He has noted renal function impairment.  Goal of Therapy:  Potassium 4.0 - 5.1 mmol/L Magnesium 2.0 - 2.4 mg/dL All Other Electrolytes WNL  Plan:   Lokelma has been discontinued  Follow up BMP with morning labs to ensure stability  Continue to follow along with CCU medical team to optimize electrolytes  Tawnya Crook ,PharmD Clinical Pharmacist 11/20/2020 12:03 PM

## 2020-11-20 NOTE — Consult Note (Addendum)
Consultation Note Date: 11/20/2020   Patient Name: [facility]  DOB: [birth year]  MRN: [id number]  Age / Sex: 80 y.o., male  PCP: [name], DO Referring Physician: Ottie Glazier, MD  Reason for Consultation: Establishing goals of care  HPI/Patient Profile: This is 80 year old male with a history of CKD 4, chronic venous insufficiency, DVT on Xarelto, peripheral artery disease, CHF seen by [facility] last transthoracic echo done December 2021, morbid obesity, obstructive sleep apnea on CPAP device at home, type 2 diabetes, Lewy body dementia with parkinsonism, chronic anemia history of total knee replacement, seizure disorder came in status post cardiac arrest.  Received ACLS 20 minutes with ROSC.  Lost pulse again and required ACLS second time followed by ROSC required endotracheal intubation in the ER.  Medical intensive care unit admission post cardiac arrest.    Clinical Assessment and Goals of Care: Patient is resting in bed with daughter who is H- POA at bedside along with her husband. Patient is intubated. Daughter states he had covid 1 year ago and was hospitalized, and never really recovered to baseline. He went to rehab, and then went to live with her. She states he came to live with them for home health rehab once he left SNF, but then he declined. They state physically he requires help with everything except feeding himself. They voice is has been a struggle for them and they have been looking into long-term care placement prior to this hospitalization.   We discussed  diagnosis, prognosis, GOC, EOL wishes disposition and options.  A detailed discussion was had today regarding advanced directives.  Concepts specific to code status, artifical feeding and hydration, IV antibiotics and rehospitalization were discussed.  The difference between an aggressive medical intervention path and a  comfort care path was discussed.  Values and goals of care important to patient and family were attempted to be elicited.  Discussed limitations of medical interventions to prolong quality of life in some situations and discussed the concept of human mortality.  They state QOL is very important. They state they would like to see how things go when warming is done and CCM is ready to take next steps. They state he would not want a tracheostomy. They do not want CPR. Continue current intubation.  Prognosis:   Poor      Primary Diagnoses: Present on Admission: . Cardiac arrest ([place])   I have reviewed the medical record, interviewed the patient and family, and examined the patient. The following aspects are pertinent.  Past Medical History:  Diagnosis Date  . (HFpEF) heart failure with preserved ejection fraction ([place])    a. 05/2020 Echo: EF >55%, Gr2 DD. Mildly enlarged RV. Mild LAE. Mild MR.  . Arthritis   . Bilateral swelling of feet   . BPH (benign prostatic hyperplasia)   . CKD (chronic kidney disease), stage III ([place])   . COVID-19 virus infection 06/2019  . Dementia ([place])    a. requires 24 hr care  . Essential hypertension   .  Gout   . Left Foot Tendonitis   . Right Lower Extremity DVT (deep venous thrombosis) ([place])    a. On Xarelto; s/p IVC filter.  . Seizures ([place])    a. on keppra  . Sleep apnea    Social History   Socioeconomic History  . Marital status: Married    Spouse name: Not on file  . Number of children: Not on file  . Years of education: Not on file  . Highest education level: Not on file  Occupational History  . Not on file  Tobacco Use  . Smoking status: Former Smoker    Packs/day: 1.00    Years: 10.00    Pack years: 10.00    Types: Cigarettes    Quit date: 06/23/1968    Years since quitting: 52.4  . Smokeless tobacco: Never Used  Vaping Use  . Vaping Use: Never used  Substance and Sexual Activity  . Alcohol use: No  . Drug use: No  .  Sexual activity: Not on file  Other Topics Concern  . Not on file  Social History Narrative  . Not on file   Social Determinants of Health   Financial Resource Strain: Low Risk   . Difficulty of Paying Living Expenses: Not hard at all  Food Insecurity: No Food Insecurity  . Worried About Charity fundraiser in the Last Year: Never true  . Ran Out of Food in the Last Year: Never true  Transportation Needs: No Transportation Needs  . Lack of Transportation (Medical): No  . Lack of Transportation (Non-Medical): No  Physical Activity: Inactive  . Days of Exercise per Week: 0 days  . Minutes of Exercise per Session: 0 min  Stress: No Stress Concern Present  . Feeling of Stress : Only a little  Social Connections: Moderately Isolated  . Frequency of Communication with Friends and Family: More than three times a week  . Frequency of Social Gatherings with Friends and Family: More than three times a week  . Attends Religious Services: Never  . Active Member of Clubs or Organizations: No  . Attends Archivist Meetings: Not on file  . Marital Status: Married   Family History  Problem Relation Age of Onset  . Arthritis Mother   . Hypertension Mother   . Diabetes Brother    Scheduled Meds: . bacitracin   Topical BID  . chlorhexidine gluconate (MEDLINE KIT)  15 mL Mouth Rinse BID  . Chlorhexidine Gluconate Cloth  6 each Topical Q0600  . mouth rinse  15 mL Mouth Rinse 10 times per day  . pantoprazole (PROTONIX) IV  40 mg Intravenous QHS   Continuous Infusions: . sodium chloride 10 mL/hr at 11/20/20 0700  . fentaNYL infusion INTRAVENOUS 150 mcg/hr (11/20/20 0700)  . levETIRAcetam Stopped (11/20/20 0430)  . midazolam 3 mg/hr (11/20/20 1315)  . norepinephrine (LEVOPHED) Adult infusion 3 mcg/min (11/20/20 1319)   PRN Meds:.docusate sodium, fentaNYL, midazolam, polyethylene glycol Medications Prior to Admission:  Prior to Admission medications   Medication Sig Start Date  End Date Taking? Authorizing Provider  albuterol (VENTOLIN HFA) 108 (90 Base) MCG/ACT inhaler Inhale 2 puffs into the lungs every 4 (four) hours as needed for wheezing.   Yes [provider]  allopurinol (ZYLOPRIM) 100 MG tablet Take 100 mg by mouth daily.   Yes [provider]  atorvastatin (LIPITOR) 20 MG tablet Take 1 tablet (20 mg total) by mouth at bedtime. 05/21/20  Yes Karamalegos, Devonne Doughty, DO  fexofenadine (  ALLEGRA) 180 MG tablet Take 180 mg by mouth daily as needed for allergies.    Yes [provider]  finasteride (PROSCAR) 5 MG tablet Take 1 tablet (5 mg total) by mouth daily. 10/24/20  Yes Vaillancourt, Samantha, PA-C  fluticasone (FLONASE) 50 MCG/ACT nasal spray Place 2 sprays into both nostrils daily as needed for allergies.  09/26/14  Yes [provider]  ketoconazole (NIZORAL) 2 % cream Apply to aa's groin QD. 10/18/20  Yes Ralene Bathe, MD  levETIRAcetam (KEPPRA) 500 MG tablet Take 500 mg by mouth 2 (two) times daily.  03/08/19  Yes [provider]  Magnesium 250 MG TABS Take 250 mg by mouth daily.   Yes [provider]  metoprolol succinate (TOPROL-XL) 25 MG 24 hr tablet Take 1 tablet (25 mg total) by mouth daily. 11/16/20  Yes Kate Sable, MD  Multiple Vitamin (ONE-A-DAY MENS PO) Take 1 tablet by mouth daily.    Yes [provider]  rivaroxaban (XARELTO) 10 MG TABS tablet TAKE ONE TABLET BY MOUTH EVERY DAY WITH BREAKFAST Patient taking differently: Take 10 mg by mouth daily. 05/10/20 05/10/21 Yes Kirk Ruths, MD  torsemide (DEMADEX) 20 MG tablet Take 1 tablet (20 mg total) by mouth daily. Take 1 tab by mouth every Mon/Wed/Fri/Sat (4 days a week) 10/22/20  Yes Agbor-Etang, Aaron Edelman, MD  docusate sodium (COLACE) 100 MG capsule Take 100 mg by mouth daily as needed for mild constipation or moderate constipation.     [provider]  fluconazole (DIFLUCAN) 200 MG tablet Take one tab po QD on Monday,  Wednesday, and Friday 10/18/20   Ralene Bathe, MD  LORazepam (ATIVAN) 0.5 MG tablet Take 0.5 mg by mouth every 4 (four) hours as needed. 09/06/20   [provider]   Allergies  Allergen Reactions  . Shellfish Allergy Anaphylaxis and Other (See Comments)    gout  . Iodinated Diagnostic Agents Other (See Comments)    Chronic kidney disease 3b  . Nsaids Other (See Comments)    Chronic kidney disease 3b  . Rivastigmine Other (See Comments)    Side effect and not a true allergy   Review of Systems  Unable to perform ROS   Physical Exam Constitutional:      Comments: Eyes closed. On ventilator.      Vital Signs: BP (!) 125/59   Pulse 82   Temp 98.6 F (37 C)   Resp 16   Ht 5' 10.5" (1.791 m)   Wt (!) 141.7 kg   SpO2 94%   BMI 44.19 kg/m  Pain Scale: CPOT       SpO2: SpO2: 94 % O2 Device:SpO2: 94 % O2 Flow Rate: .   IO: Intake/output summary:   Intake/Output Summary (Last 24 hours) at 11/20/2020 1327 Last data filed at 11/20/2020 1000 Gross per 24 hour  Intake 1963.8 ml  Output 3050 ml  Net -1086.2 ml    LBM: Last BM Date: 11/16/2020 Baseline Weight: Weight: (!) 142.8 kg Most recent weight: Weight: (!) 141.7 kg        Time In: 11:20 Time Out: 12:30 Time Total: 70 min Greater than 50%  of this time was spent counseling and coordinating care related to the above assessment and plan.  Signed by: Asencion Gowda, NP   Please contact Palliative Medicine Team phone at [phone] for questions and concerns.  For individual provider: See Shea Evans

## 2020-11-20 NOTE — Progress Notes (Signed)
[facility]  ROUNDING NOTE   Subjective:   UOP 296m.  Creatinine 3.83 (3.75)  Na 147.  Daughter and son-in-law at bedside.   Objective:  Vital signs in last 24 hours:  Temp:  [96.8 F (36 C)-98.6 F (37 C)] 98.6 F (37 C) (05/31 0700) Pulse Rate:  [66-91] 82 (05/31 0700) Resp:  [15-34] 15 (05/31 0700) BP: (113-142)/(51-65) 120/57 (05/31 0700) SpO2:  [91 %-100 %] 99 % (05/31 0835) FiO2 (%):  [28 %] 28 % (05/31 0835) Weight:  [141.7 kg] 141.7 kg (05/31 0444)  Weight change: -1.1 kg Filed Weights   11/01/2020 1414 11/20/20 0444  Weight: (!) 142.8 kg (!) 141.7 kg    Intake/Output: I/O last 3 completed shifts: In: 4463.2 [I.V.:1579.1; Blood:2447.5; IV Piggyback:436.6] Out: 48341[Urine:3635; Emesis/NG output:450]   Intake/Output this shift:  No intake/output data recorded.  Physical Exam: General: Critically ill  Head: ETT   Eyes: Eyes closed  Neck: trachea midline  Lungs:  PRVC FiO2 28%  Heart: Regular rate and rhythm  Abdomen:  Soft, nontender  Extremities: no peripheral edema.  Neurologic: Intubated, sedated  Skin: No lesions        Basic Metabolic Panel: Recent Labs  Lab 11/13/2020 1207 11/03/2020 2032 11/19/20 0459 11/19/20 1647 11/20/20 0428  NA 144 144 145 145 147*  K 4.8 5.6* 5.2* 4.5 4.1  CL 111 112* 115* 112* 116*  CO2 19* _0 GLUCOSE 175* 144* 123* 100* 86  BUN 99* 106* 112* 105* 98*  CREATININE 3.18* 3.42* 3.60* 3.75* 3.83*  CALCIUM 9.0 8.3* 8.0* 8.1* 8.1*  MG 2.6* 2.2 2.4  --  1.9  PHOS  --  5.4* 5.9*  --  4.6    Liver Function Tests: Recent Labs  Lab 11/13/2020 1207  AST 179*  ALT 159*  ALKPHOS 108  BILITOT 0.5  PROT 5.6*  ALBUMIN 2.8*   No results for input(s): LIPASE, AMYLASE in the last 168 hours. No results for input(s): AMMONIA in the last 168 hours.  CBC: Recent Labs  Lab 11/01/2020 1241 11/09/2020 1336 11/01/2020 1812 11/19/20 0459 11/19/20 1647 11/20/20 0428  WBC 12.7* 13.5*  --  11.4*  --   11.0*  NEUTROABS  --  12.0*  --   --   --   --   HGB 2.5* 2.5* 4.5* 6.5* 8.4* 7.3*  HCT 8.0* 7.9* 14.1* 18.9*  --  21.9*  MCV 67.2* 68.1*  --  80.1  --  80.5  PLT 153 151  --  108*  --  92*    Cardiac Enzymes: No results for input(s): CKTOTAL, CKMB, CKMBINDEX, TROPONINI in the last 168 hours.  BNP: Invalid input(s): POCBNP  CBG: Recent Labs  Lab 11/19/20 1603 11/19/20 1930 11/19/20 2331 11/20/20 0345 11/20/20 0726  GLUCAP 95 88 84 81 84    Microbiology: Results for orders placed or performed during the hospital encounter of 11/20/2020  Resp Panel by RT-PCR (Flu A&B, Covid) Nasopharyngeal Swab     Status: None   Collection Time: 11/04/2020 12:07 PM   Specimen: Nasopharyngeal Swab; Nasopharyngeal(NP) swabs in vial transport medium  Result Value Ref Range Status   SARS Coronavirus 2 by RT PCR NEGATIVE NEGATIVE Final    Comment: (NOTE) SARS-CoV-2 target nucleic acids are NOT DETECTED.  The SARS-CoV-2 RNA is generally detectable in upper respiratory specimens during the acute phase of infection. The lowest concentration of SARS-CoV-2 viral copies this assay can detect is 138 copies/mL. A negative result does not  preclude SARS-Cov-2 infection and should not be used as the sole basis for treatment or other patient management decisions. A negative result may occur with  improper specimen collection/handling, submission of specimen other than nasopharyngeal swab, presence of viral mutation(s) within the areas targeted by this assay, and inadequate number of viral copies(<138 copies/mL). A negative result must be combined with clinical observations, patient history, and epidemiological information. The expected result is Negative.  Fact Sheet for Patients:  https://www.fda.gov/media/152166/download  Fact Sheet for Healthcare Providers:  https://www.fda.gov/media/152162/download  This test is no t yet approved or cleared by the [place] FDA and  has been authorized for  detection and/or diagnosis of SARS-CoV-2 by FDA under an Emergency Use Authorization (EUA). This EUA will remain  in effect (meaning this test can be used) for the duration of the COVID-19 declaration under Section 564(b)(1) of the Act, 21 U.S.C.section 360bbb-3(b)(1), unless the authorization is terminated  or revoked sooner.       Influenza A by PCR NEGATIVE NEGATIVE Final   Influenza B by PCR NEGATIVE NEGATIVE Final    Comment: (NOTE) The Xpert Xpress SARS-CoV-2/FLU/RSV plus assay is intended as an aid in the diagnosis of influenza from Nasopharyngeal swab specimens and should not be used as a sole basis for treatment. Nasal washings and aspirates are unacceptable for Xpert Xpress SARS-CoV-2/FLU/RSV testing.  Fact Sheet for Patients: https://www.fda.gov/media/152166/download  Fact Sheet for Healthcare Providers: https://www.fda.gov/media/152162/download  This test is not yet approved or cleared by the [place] FDA and has been authorized for detection and/or diagnosis of SARS-CoV-2 by FDA under an Emergency Use Authorization (EUA). This EUA will remain in effect (meaning this test can be used) for the duration of the COVID-19 declaration under Section 564(b)(1) of the Act, 21 U.S.C. section 360bbb-3(b)(1), unless the authorization is terminated or revoked.  Performed at [facility], [address] [place], [place] [address]   CULTURE, BLOOD (ROUTINE X 2) w Reflex to ID Panel     Status: None (Preliminary result)   Collection Time: 10/22/2020 12:41 PM   Specimen: BLOOD  Result Value Ref Range Status   Specimen Description BLOOD LEFT ASSIST CONTROL  Final   Special Requests   Final    BOTTLES DRAWN AEROBIC AND ANAEROBIC Blood Culture adequate volume   Culture   Final    NO GROWTH 2 DAYS Performed at [facility], [address] [place], [place] [address]    Report Status PENDING  Incomplete  CULTURE, BLOOD (ROUTINE X 2) w Reflex to ID Panel      Status: None (Preliminary result)   Collection Time: 10/26/2020 12:42 PM   Specimen: BLOOD  Result Value Ref Range Status   Specimen Description BLOOD RIGHT ASSIST CONTROL  Final   Special Requests   Final    BOTTLES DRAWN AEROBIC AND ANAEROBIC Blood Culture adequate volume   Culture   Final    NO GROWTH 2 DAYS Performed at [facility], [address] [place], [place] [address]    Report Status PENDING  Incomplete  Urine culture     Status: None   Collection Time: 10/21/2020  1:36 PM   Specimen: Urine, Random  Result Value Ref Range Status   Specimen Description   Final    URINE, RANDOM Performed at [facility], [address] [place], [place] [address]    Special Requests   Final    NONE Performed at [facility], [address] [place], [place] [address]      Culture   Final    NO GROWTH Performed at [facility], [address] [place], [place] [address]    Report Status 11/20/2020 FINAL  Final  MRSA PCR Screening     Status: None   Collection Time: 11/07/2020  3:55 PM   Specimen: Nasal Mucosa; Nasopharyngeal  Result Value Ref Range Status   MRSA by PCR NEGATIVE NEGATIVE Final    Comment:        The GeneXpert MRSA Assay (FDA approved for NASAL specimens only), is one component of a comprehensive MRSA colonization surveillance program. It is not intended to diagnose MRSA infection nor to guide or monitor treatment for MRSA infections. Performed at [facility], [address] [place], [place] [address]   Culture, respiratory (tracheal aspirate)     Status: None (Preliminary result)   Collection Time: 11/06/2020  4:30 PM   Specimen: Tracheal Aspirate; Respiratory  Result Value Ref Range Status   Specimen Description   Final    TRACHEAL ASPIRATE Performed at [facility], [address] [place], [place] [address]    Special Requests   Final    NONE Performed at [facility], [address]  [place], [place] [address]    Gram Stain   Final    MODERATE WBC PRESENT, PREDOMINANTLY PMN NO ORGANISMS SEEN    Culture   Final    NO GROWTH < 12 HOURS Performed at [facility], [address] [place], [place] [address]    Report Status PENDING  Incomplete  Respiratory (~20 pathogens) panel by PCR     Status: None   Collection Time: 10/27/2020  4:30 PM  Result Value Ref Range Status   Adenovirus NOT DETECTED NOT DETECTED Final   Coronavirus 229E NOT DETECTED NOT DETECTED Final    Comment: (NOTE) The Coronavirus on the Respiratory Panel, DOES NOT test for the novel  Coronavirus (2019 nCoV)    Coronavirus HKU1 NOT DETECTED NOT DETECTED Final   Coronavirus NL63 NOT DETECTED NOT DETECTED Final   Coronavirus OC43 NOT DETECTED NOT DETECTED Final   Metapneumovirus NOT DETECTED NOT DETECTED Final   Rhinovirus / Enterovirus NOT DETECTED NOT DETECTED Final   Influenza A NOT DETECTED NOT DETECTED Final   Influenza B NOT DETECTED NOT DETECTED Final   Parainfluenza Virus 1 NOT DETECTED NOT DETECTED Final   Parainfluenza Virus 2 NOT DETECTED NOT DETECTED Final   Parainfluenza Virus 3 NOT DETECTED NOT DETECTED Final   Parainfluenza Virus 4 NOT DETECTED NOT DETECTED Final   Respiratory Syncytial Virus NOT DETECTED NOT DETECTED Final   Bordetella pertussis NOT DETECTED NOT DETECTED Final   Bordetella Parapertussis NOT DETECTED NOT DETECTED Final   Chlamydophila pneumoniae NOT DETECTED NOT DETECTED Final   Mycoplasma pneumoniae NOT DETECTED NOT DETECTED Final    Comment: Performed at  Hospital Lab, [address] [place],  [address]    Coagulation Studies: Recent Labs    11/20/2020 2032  LABPROT 26.1*  INR 2.4*    Urinalysis: No results for input(s): COLORURINE, LABSPEC, PHURINE, GLUCOSEU, HGBUR, BILIRUBINUR, KETONESUR, PROTEINUR, UROBILINOGEN, NITRITE, LEUKOCYTESUR in the last 72 hours.  Invalid input(s): APPERANCEUR    Imaging: CT HEAD WO CONTRAST  Result Date:  11/16/2020 CLINICAL DATA:  Suspected stroke. EXAM: CT HEAD WITHOUT CONTRAST TECHNIQUE: Contiguous axial images were obtained from the base of the skull through the vertex without intravenous contrast. COMPARISON:  04/20/2021 FINDINGS: Brain: No evidence of acute infarction, hemorrhage, hydrocephalus, extra-axial collection   or mass lesion/mass effect. Moderate brain parenchymal volume loss and deep white matter microangiopathy. 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 Vascular: Calcific atherosclerotic disease of the intra cavernous carotid arteries. Skull: Normal. Negative for fracture or focal lesion. Sinuses/Orbits: No acute finding. Other: None. IMPRESSION: 1. No acute intracranial abnormality. 2. Atrophy, chronic microvascular disease. Electronically Signed   By: Fidela Salisbury M.D.   On: 11/19/2020 14:26   DG Chest Port 1 View  Result Date: 11/02/2020 CLINICAL DATA:  80 year old male with central line placement. EXAM: PORTABLE CHEST 1 VIEW COMPARISON:  Earlier chest radiograph dated 11/13/2020 and CT dated 11/11/2020 FINDINGS: Interval placement of a right IJ central venous line with tip close to the cavoatrial junction or over the right atrium. Endotracheal tube approximately 2 cm above the carina. The tube can be pulled back by 2 cm for optimal positioning. Enteric tube with tip in the left upper abdomen likely in the gastric fundus. Shallow inspiration with bilateral streaky densities similar to prior radiograph. No large pleural effusion or pneumothorax. Stable cardiomegaly. Atherosclerotic calcification of the aorta. No acute osseous pathology. IMPRESSION: 1. Interval placement of a right IJ central venous line with tip close to the cavoatrial junction. No pneumothorax. 2. Endotracheal tube above the carina. The tube can be pulled back by 2 cm for optimal positioning. 3. No interval change in bilateral pulmonary opacities. Electronically Signed   By: Anner Crete M.D.   On: 11/06/2020 15:27   DG Chest Portable 1 View  Result Date: 10/24/2020 CLINICAL DATA:  80 year old male status post intubation. EXAM: PORTABLE CHEST 1 VIEW COMPARISON:  Chest x-ray 06/28/2020. FINDINGS: An endotracheal tube is in place with tip 2.5 cm above the carina. A nasogastric tube is seen extending into the stomach, however, the tip of the nasogastric tube extends below the lower margin of the image. Transcutaneous defibrillator pads project over the lower left hemithorax. Lung volumes are low. Cephalization of the pulmonary vasculature. Airspace consolidation in the right upper lobe and periphery of the right lung base. Left lung otherwise appears clear. No definite pleural effusions. No pneumothorax. Heart size is mildly enlarged. Upper mediastinal contours are distorted by patient positioning. Aortic atherosclerosis. IMPRESSION: 1. Support apparatus, as above. 2. Right upper lobe airspace consolidation and additional consolidative changes in the right lung base concerning for multilobar pneumonia. 3. Cardiomegaly with pulmonary venous congestion. 4. Aortic atherosclerosis. Electronically Signed   By: Vinnie Langton M.D.   On: 11/19/2020 12:29   DG Abd Portable 1 View  Result Date: 11/05/2020 CLINICAL DATA:  80 year old male status post intubation. EXAM: PORTABLE ABDOMEN - 1 VIEW COMPARISON:  No priors. FINDINGS: Nasogastric tube in the mid stomach. IVC filter projecting over the right mid abdomen. Gas and stool are seen scattered throughout the colon extending to the level of the distal rectum. No pathologic distension of small bowel is noted. No gross evidence of pneumoperitoneum. IMPRESSION: 1. Support apparatus, as above. 2. Nonobstructive bowel gas pattern. Electronically Signed   By: Vinnie Langton M.D.   On: 11/15/2020 12:31   EEG adult  Result Date: 11/19/2020 Lora Havens, MD     11/19/2020  3:21 PM Patient Name: Justin Shannon. MRN: [id number] Epilepsy  Attending: Lora Havens Referring Physician/Provider: Olga Millers, NP Date: 11/19/2020 Duration: 22.29 mins Patient history: 80yo M with witnessed focal seizure activity post cardiac arrest. EEG to evaluate for seizure Level of alertness: comatose AEDs during EEG study: LEV, versed Technical aspects: This EEG  study was done with scalp electrodes positioned according to the 10-20 International system of electrode placement. Electrical activity was acquired at a sampling rate of 500Hz and reviewed with a high frequency filter of 70Hz and a low frequency filter of 1Hz. EEG data were recorded continuously and digitally stored. Description: EEG showed near continuous generalized 3 to 6 Hz theta-delta slowing with brief periods of generalized attenuation lasting 1-3 seconds. Hyperventilation and photic stimulation were not performed.   ABNORMALITY - Continuous slow, generalized IMPRESSION: This study is suggestive of severe diffuse encephalopathy, nonspecific etiology. No seizures or epileptiform discharges were seen throughout the recording. Priyanka O Yadav   ECHOCARDIOGRAM COMPLETE  Result Date: 10/26/2020    ECHOCARDIOGRAM REPORT   Patient Name:   Niccolas Hemric Jr. Date of Exam: 11/02/2020 Medical Rec #:  [id number]          Height:       70.5 in Accession #:    [id number]         Weight:       314.8 lb Date of Birth:  08/12/1940           BSA:          2.545 m Patient Age:    79 years           BP:           134/69 mmHg Patient Gender: M                  HR:           92 bpm. Exam Location:  [facility] Procedure: 2D Echo and Intracardiac Opacification Agent Indications:     Cardiac arrest I46.9  History:         Patient has no prior history of Echocardiogram examinations.  Sonographer:     Tikeshia Johnson RDCS Referring Phys:  [id number] FUAD ALESKEROV Diagnosing Phys: Timothy Gollan MD  Sonographer Comments: Technically challenging study due to limited acoustic windows, suboptimal subcostal window and echo  performed with patient supine and on artificial respirator. Image acquisition challenging due to respiratory motion and Image acquisition challenging due to patient body habitus. IMPRESSIONS  1. Left ventricular ejection fraction, by estimation, is 60 to 65%. The left ventricle has normal function. The left ventricle has no regional wall motion abnormalities. There is moderate left ventricular hypertrophy. Left ventricular diastolic parameters are consistent with Grade I diastolic dysfunction (impaired relaxation).  2. Right ventricular systolic function is normal. The right ventricular size is normal. Tricuspid regurgitation signal is inadequate for assessing PA pressure.  3. The mitral valve is normal in structure. No evidence of mitral valve regurgitation. No evidence of mitral stenosis. FINDINGS  Left Ventricle: Left ventricular ejection fraction, by estimation, is 60 to 65%. The left ventricle has normal function. The left ventricle has no regional wall motion abnormalities. Definity contrast agent was given IV to delineate the left ventricular  endocardial borders. The left ventricular internal cavity size was normal in size. There is moderate left ventricular hypertrophy. Left ventricular diastolic parameters are consistent with Grade I diastolic dysfunction (impaired relaxation). Right Ventricle: The right ventricular size is normal. No increase in right ventricular wall thickness. Right ventricular systolic function is normal. Tricuspid regurgitation signal is inadequate for assessing PA pressure. Left Atrium: Left atrial size was normal in size. Right Atrium: Right atrial size was normal in size. Pericardium: There is no evidence of pericardial effusion. Mitral Valve: The mitral valve is normal in structure. No evidence of mitral   valve regurgitation. No evidence of mitral valve stenosis. Tricuspid Valve: The tricuspid valve is normal in structure. Tricuspid valve regurgitation is mild . No evidence of  tricuspid stenosis. Aortic Valve: The aortic valve is normal in structure. Aortic valve regurgitation is not visualized. No aortic stenosis is present. Aortic valve mean gradient measures 7.0 mmHg. Aortic valve peak gradient measures 14.7 mmHg. Pulmonic Valve: The pulmonic valve was normal in structure. Pulmonic valve regurgitation is not visualized. No evidence of pulmonic stenosis. Aorta: The aortic root is normal in size and structure. Venous: The inferior vena cava is normal in size with greater than 50% respiratory variability, suggesting right atrial pressure of 3 mmHg. IAS/Shunts: No atrial level shunt detected by color flow Doppler.  LEFT VENTRICLE PLAX 2D LVIDd:         3.66 cm Diastology LVIDs:         2.60 cm LV e' medial:    9.03 cm/s LV PW:         1.34 cm LV E/e' medial:  7.6 LV IVS:        1.30 cm LV e' lateral:   11.30 cm/s                        LV E/e' lateral: 6.1  RIGHT VENTRICLE RV S prime:     13.75 cm/s TAPSE (M-mode): 3.7 cm LEFT ATRIUM           Index       RIGHT ATRIUM           Index LA diam:      4.00 cm 1.57 cm/m  RA Area:     13.10 cm LA Vol (A4C): 52.7 ml 20.71 ml/m RA Volume:   33.30 ml  13.09 ml/m  AORTIC VALVE AV Vmax:           191.50 cm/s AV Vmean:          121.000 cm/s AV VTI:            0.317 m AV Peak Grad:      14.7 mmHg AV Mean Grad:      7.0 mmHg LVOT Vmax:         131.00 cm/s LVOT Vmean:        83.700 cm/s LVOT VTI:          0.249 m LVOT/AV VTI ratio: 0.79 MITRAL VALVE               TRICUSPID VALVE MV Area (PHT): 3.68 cm    TV Peak grad:   29.1 mmHg MV Decel Time: 206 msec    TV Vmax:        2.70 m/s MV E velocity: 68.40 cm/s MV A velocity: 98.50 cm/s  SHUNTS MV E/A ratio:  0.69        Systemic VTI: 0.25 m Timothy Gollan MD Electronically signed by Timothy Gollan MD Signature Date/Time: 11/03/2020/4:34:13 PM    Final    CT CHEST ABDOMEN PELVIS WO CONTRAST  Result Date: 10/27/2020 CLINICAL DATA:  Pt arrived ems from home. Pt was at home and collapsed and CPR was  started. 18 mins of CPR. 3 epi, 2 bicarb, 1 calcium. ROSC achieved. While en route, pt lost pulses again but after 2 round of CPR ROSC was obtained again with history of diabetes, peripheral vascular disease with nonhealing ulcers on the right lower extremity, hypertension, congestive heart failure, BPH, history of DVT, history of COVID infection, dementia and chronic   kidney disease stage IIIb with a baseline creatinine of about 2.4 as of 2 weeks ago. Patient sees Dr. Harmeet Singh in the office. He was last seen in last month.As per the family while sitting in the car he became on responsive. The EMT came and did CPR. Patient was unresponsive for about 20 minutes finally he regained pulse. Patient is now intubated and sedated EXAM: CT CHEST, ABDOMEN AND PELVIS WITHOUT CONTRAST TECHNIQUE: Multidetector CT imaging of the chest, abdomen and pelvis was performed following the standard protocol without IV contrast. Examlimited by lack of intravenous and oral contrast and due to artifact from the arms being at patient's side. COMPARISON:  07/21/2020. FINDINGS: CT CHEST FINDINGS Cardiovascular: Heart is normal in size and configuration. No pericardial effusion. Great vessels normal in caliber. Mild aortic atherosclerosis. Mediastinum/Nodes: Endotracheal tube tip projects at the upper level of the carina. Nasal/orogastric tube passes below the diaphragm, well within the stomach. No neck base, axillary, mediastinal or hilar masses or enlarged lymph nodes. Trachea and esophagus are grossly unremarkable. Lungs/Pleura: Small pleural effusions. Bilateral patchy areas of ground-glass and more confluent opacity, most evident in the right upper lobe. Additional opacity is noted dependently in the lower lobes consistent with atelectasis. Mild interstitial thickening bilaterally. No mass. No suspicious nodule. No pneumothorax. Musculoskeletal: Subtle nondisplaced fractures of the anterior right 7 anterior left sixth ribs. No other  fractures. No bone lesions. CT ABDOMEN PELVIS FINDINGS Hepatobiliary: Limited assessment of the from the liver and gallbladder due to artifact arms. Liver normal in size. No convincing mass. Gallbladder unremarkable. No evidence duct dilation. Pancreas: Assessment also somewhat limited by artifact from the arms. No mass or inflammation. Spleen: Normal in size without focal abnormality. Adrenals/Urinary Tract: No adrenal masses. Kidneys normal in orientation and position. Areas of renal cortical thinning, likely scarring. No convincing renal mass, no stone and no hydronephrosis. Ureters not well visualized, but no evidence of a ureteral stone or ureteral dilation. Bladder is decompressed with a Foley catheter. Stomach/Bowel: Stomach is unremarkable. Small bowel and colon are normal in caliber. No wall thickening. No inflammation. Scattered left colon diverticula. Mild increase in the colonic stool burden. No evidence of appendicitis. Vascular/Lymphatic: Stable vena cava filter. Single tiny focus of aortic atherosclerotic calcification. No aneurysm. No enlarged lymph nodes. Reproductive: Unremarkable. Other: No abdominal wall hernia.  No ascites. Musculoskeletal: No fracture or acute finding.  No bone lesion. IMPRESSION: CT CHEST 1. Patchy bilateral ground-glass and confluent airspace opacities, most evident in the right upper lobe, which could be the result CPR, but is more suggestive of multifocal pneumonia. 2. Nondisplaced fractures of the anterior right seventh and anterior left sixth ribs presumably occurring during CPR. 3. Dependent opacity in both lower lobes consistent with atelectasis. Small effusions. ABDOMEN AND PELVIS CT 1. No acute findings within the abdomen or pelvis. Electronically Signed   By: David  Ormond M.D.   On: 10/31/2020 14:30     Medications:   . sodium chloride 10 mL/hr at 11/20/20 0700  . fentaNYL infusion INTRAVENOUS 150 mcg/hr (11/20/20 0700)  . levETIRAcetam Stopped (11/20/20  0430)  . midazolam 4 mg/hr (11/20/20 0700)  . norepinephrine (LEVOPHED) Adult infusion 4 mcg/min (11/20/20 0743)   . bacitracin   Topical BID  . chlorhexidine gluconate (MEDLINE KIT)  15 mL Mouth Rinse BID  . Chlorhexidine Gluconate Cloth  6 each Topical Q0600  . mouth rinse  15 mL Mouth Rinse 10 times per day  . pantoprazole (PROTONIX) IV  40 mg Intravenous QHS     docusate sodium, fentaNYL, midazolam, polyethylene glycol  Assessment/ Plan:  Mr. Cotey Rakes. is a 80 y.o. black male with diabetes mellitus type II, peripheral vascular disease, diabetic lower extremity ulcers, hypertension, congestive heart failure, BPH, DVT, lewy body dementia, sleep apnea, seizures disorder, gout, Parkinson's, who is admitted to [facility] on 10/21/2020 on Cardiac arrest ([place]) [I46.9] Abnormal chest x-ray [R93.89]  1. Acute kidney injury on chronic kidney disease stage IIIB: baseline creatinine 2.19, GFR of 30 on 10/21/2020. With proteinuria.  Hyperkalemia has resolved.  No IV contrast exposure No indication for dialysis.  - Discontinue lokelma - Monitor for diuretic need.   2. Acute anemia on kidney failure: status post PRBC transfusions. Appreciate hematology input.   3. Hypotension: requiring vasopressor: norepinephrine  4. Hypernatremia: 147. With free water deficit.    LOS: 2 Mauria Asquith 5/31/20228:44 AM

## 2020-11-20 NOTE — Progress Notes (Signed)
Neurology Progress Note HPI from initial consult note Patient is comatose and unable to provide hx. Per ICU H&P: "This is 80 year old male with a history of CKD 4, chronic venous insufficiency, DVT on Xarelto, peripheral artery disease, CHF seen by [facility] last transthoracic echo done December 2021, morbid obesity, obstructive sleep apnea on CPAP device at home, type 2 diabetes, Lewy body dementia with parkinsonism, chronic anemia history of total knee replacement, seizure disorder came in status post cardiac arrest. Received ACLS 20 minutes with ROSC. Lost pulse again and required ACLS second time followed by ROSC required endotracheal intubation in the ER. Medical intensive care unit admission post cardiac arrest.  Evaluated patient with nephrologist together at the bedside. Family was present during my evaluation. They deny flulike illness malaise, myalgia he did previously have COVID-19 and recovered to baseline. Additional review of systems reveals decreased appetite over 48 hours. Denies diarrhea nausea vomiting or any additional symptoms. Labs show chronic anemia, CKD metabolic acidosis with high anion gap. Vitals without hypotension or shock. On exam GCS 4 T with plan to perform CT head to rule out intracerebral hemorrhage followed by [place] cooling protocol post cardiac arrest." CTH was unremarkable. When I evaluated patient at bedside he had rhythmic twitching of his right face and RUE as well as intermittent oral automatisms. He was loaded with keppra and continued on maintenance 532m q 12 hrs (renal dosing) and started on versed gtt.  S:// Seen and examined overnight. No acute events. Hemoglobin 7.3 today Rewarmed at 9 PM on 11/19/2020.  O:// Current vital signs: BP (!) 120/57   Pulse 82   Temp 98.6 F (37 C) (Bladder)   Resp 15   Ht 5' 10.5" (1.791 m)   Wt (!) 141.7 kg   SpO2 99%   BMI 44.19 kg/m  Vital signs in last 24 hours: Temp:  [96.8 F (36 C)-98.6 F (37  C)] 98.6 F (37 C) (05/31 0700) Pulse Rate:  [66-91] 82 (05/31 0700) Resp:  [15-34] 15 (05/31 0700) BP: (113-142)/(51-65) 120/57 (05/31 0700) SpO2:  [91 %-100 %] 99 % (05/31 0835) FiO2 (%):  [28 %] 28 % (05/31 0835) Weight:  [141.7 kg] 141.7 kg (05/31 0444) Gen: sedated intubated, on TTM HEENT: [place] AT CVS: RRR Resp: vented, diminished breath sounds all over Abd: ND NT Ext: + non-pitting edema NEUROLOGICAL EXAMINATION MS: sedated with fentanyl and 4 mg/h of Versed-intubated, no spontaneous movements, not following commands, non verbal CN: PERRL, +corneals, + breathing over the vent, gaze midline Motor: no spontaneous movement. To noxious stimulation, opens eyes, attempts flexion of both arms, no movement on b/l LE Sensory as above Coord: can not assess due to mentation  Exam essentially unchanged from yesterday  Medications  Current Facility-Administered Medications:  .  0.9 %  sodium chloride infusion, , Intravenous, Continuous, Rust-Chester, Britton L, NP, Last Rate: 10 mL/hr at 11/20/20 0700, Infusion Verify at 11/20/20 0700 .  bacitracin ointment, , Topical, BID, Rust-Chester, BHuel Cote NP, Given at 11/19/20 2226 .  chlorhexidine gluconate (MEDLINE KIT) (PERIDEX) 0.12 % solution 15 mL, 15 mL, Mouth Rinse, BID, ALanney Gins Fuad, MD, 15 mL at 11/20/20 0728 .  Chlorhexidine Gluconate Cloth 2 % PADS 6 each, 6 each, Topical, Q0600, AOttie Glazier MD, 6 each at 11/19/20 1002 .  docusate sodium (COLACE) capsule 100 mg, 100 mg, Oral, BID PRN, AOttie Glazier MD .  fentaNYL (SUBLIMAZE) bolus via infusion 50-100 mcg, 50-100 mcg, Intravenous, Q2H PRN, Rust-Chester, Britton L, NP, 50 mcg at 11/19/20 2211 .  fentaNYL 2565mg in NS 2556m(1019mml) infusion-PREMIX, 100-300 mcg/hr, Intravenous, Continuous, Rust-Chester, Britton L, NP, Last Rate: 15 mL/hr at 11/20/20 0700, 150 mcg/hr at 11/20/20 0700 .  [COMPLETED] levETIRAcetam (KEPPRA) IVPB 1000 mg/100 mL premix, 1,000 mg, Intravenous, Once,  Stopped at 11/08/2020 1848 **FOLLOWED BY** levETIRAcetam (KEPPRA) IVPB 500 mg/100 mL premix, 500 mg, Intravenous, Q12H, StaDerek JackD, Stopped at 11/20/20 0430 .  MEDLINE mouth rinse, 15 mL, Mouth Rinse, 10 times per day, AleOttie GlazierD, 15 mL at 11/20/20 0532 .  midazolam (VERSED) 50 mg in dextrose 5 % 50 mL (1 mg/mL) infusion, 0.5-10 mg/hr, Intravenous, Continuous, AleLanney Ginsuad, MD, Last Rate: 4 mL/hr at 11/20/20 0700, 4 mg/hr at 11/20/20 0700 .  midazolam (VERSED) bolus via infusion 2-4 mg, 2-4 mg, Intravenous, Q1H PRN, Rust-Chester, Britton L, NP .  norepinephrine (LEVOPHED) 4mg40m 250mL61mmix infusion, 0-50 mcg/min, Intravenous, Titrated, Rust-Chester, Britton L, NP, Last Rate: 15 mL/hr at 11/20/20 0743, 4 mcg/min at 11/20/20 0743 .  pantoprazole (PROTONIX) injection 40 mg, 40 mg, Intravenous, QHS, Rust-Chester, Britton L, NP, 40 mg at 11/19/20 2204 .  polyethylene glycol (MIRALAX / GLYCOLAX) packet 17 g, 17 g, Per Tube, Daily PRN, AleskOttie Glazier Labs CBC    Component Value Date/Time   WBC 11.0 (H) 11/20/2020 0428   RBC 2.72 (L) 11/20/2020 0428   HGB 7.3 (L) 11/20/2020 0428   HGB 12.6 (L) 10/23/2012 0519   HCT 21.9 (L) 11/20/2020 0428   HCT 39.4 (L) 10/23/2012 0519   PLT 92 (L) 11/20/2020 0428   PLT 174 10/23/2012 0519   MCV 80.5 11/20/2020 0428   MCV 72 (L) 10/23/2012 0519   MCH 26.8 11/20/2020 0428   MCHC 33.3 11/20/2020 0428   RDW 21.8 (H) 11/20/2020 0428   RDW 18.1 (H) 10/23/2012 0519   LYMPHSABS 0.9 10/21/2020 1336   LYMPHSABS 1.6 10/23/2012 0519   MONOABS 0.4 11/17/2020 1336   MONOABS 0.5 10/23/2012 0519   EOSABS 0.0 11/15/2020 1336   EOSABS 0.1 10/23/2012 0519   BASOSABS 0.0 11/04/2020 1336   BASOSABS 0.0 10/23/2012 0519   CMP     Component Value Date/Time   NA 147 (H) 11/20/2020 0428   NA 140 10/23/2012 0519   K 4.1 11/20/2020 0428   K 4.4 10/23/2012 0519   CL 116 (H) 11/20/2020 0428   CL 109 (H) 10/23/2012 0519   CO2 22 11/20/2020 0428    CO2 24 10/23/2012 0519   GLUCOSE 86 11/20/2020 0428   GLUCOSE 144 (H) 10/23/2012 0519   BUN 98 (H) 11/20/2020 0428   BUN 26 (H) 10/23/2012 0519   CREATININE 3.83 (H) 11/20/2020 0428   CREATININE 1.73 (H) 10/23/2012 0519   CALCIUM 8.1 (L) 11/20/2020 0428   CALCIUM 9.6 10/23/2012 0519   PROT 5.6 (L) 10/22/2020 1207   PROT 7.9 09/02/2011 1539   ALBUMIN 2.8 (L) 11/17/2020 1207   ALBUMIN 3.7 09/02/2011 1539   AST 179 (H) 11/14/2020 1207   AST 46 (H) 09/02/2011 1539   ALT 159 (H) 11/20/2020 1207   ALT 53 09/02/2011 1539   ALKPHOS 108 10/30/2020 1207   ALKPHOS 74 09/02/2011 1539   BILITOT 0.5 10/30/2020 1207   BILITOT 0.4 09/02/2011 1539   GFRNONAA 15 (L) 11/20/2020 0428   GFRNONAA 39 (L) 10/23/2012 0519   GFRAA 36 (L) 12/07/2019 1652   GFRAA 45 (L) 10/23/2012 0519   Imaging I have reviewed images in epic and the results pertinent to this consultation are: CT head with  no acute changes.  Assessment: 80 year old man with history of CKD 4, chronic venous insufficiency, DVT on Xarelto, peripheral arterial disease, CHF, morbid obesity, obstructive sleep apnea on CPAP at home, type 2 diabetes, Lewy body dementia with parkinsonism, chronic anemia with history of total knee replacement came in status post cardiac arrest.  He had a critical low hemoglobin- 2.5 without a clear source of bleeding.  On neurological examination he had focal twitching of the right face and arm with concern for focal seizure activity.  Also had some orofacial automatisms.  Chart review reveals that there is a history of seizure disorder and he is apparently on Keppra as an outpatient 500 twice daily-he is unable to tolerate higher dose of Keppra due to deranged renal function. His Keppra was continued along with Versed and recommendations to add propofol if needed. No seizure activity has been noted since yesterday. He is actively receiving blood transfusions with the most recent hemoglobin 6.5.  Given such severe  anemia, there is always concern for ischemic stroke-embolic and watershed etiology due to hypoperfusion.  Given the history of seizures, acute illness could also have lowered seizure threshold.  Both EEG and MRI would be helpful for further evaluation  Also, I have reviewed his chart from his last admission when I have personally seen him for worsening hallucinations and progression of his Lewy body dementia.  He has outpatient care at [facility] neurolog[facility], where he follows up with Dr. Manuella Ghazi.  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 As for the seizures-he has had 2 isolated seizures since 2013-14.  His exam does reveal some eye-opening to noxious stimulation and attempted localization with both upper extremities to noxious stimulation.   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 History of Lewy body dementia with significant cognitive decline  Recommendations: MRI of the brain without contrast when able. Routine EEG-showed slowing only.  No evidence of ongoing seizure activity.  No need for LTM EEG. Continue home Keppra He is on Versed at 4 mg/h.  We will recommend reducing Versed-reduce by 1 mg/h to completely discontinue. Neurological prognostication no sooner than 72 hours post rewarming. Management of the symptomatic anemia and blood loss per primary team and hematology. Plan discussed with the PCCM MD Dr Lanney Gins on the unit.  CRITICAL CARE ATTESTATION Performed by: Amie Portland, MD Total critical care time: 53mnutes Critical care time was exclusive of separately billable procedures and treating other patients  and/or supervising APPs/Residents/Students Critical care was necessary to treat or prevent imminent or life-threatening deterioration due to concern for seizure activity, severe symptomatic anemia with concern for strokes and hypoxic/ischemic encephalopathy This patient is critically ill and at significant risk for neurological worsening and/or death and care requires constant monitoring. Critical care was time spent personally by me on the following activities: development of treatment plan with patient and/or surrogate as well as nursing, discussions with consultants, evaluation of patient's response to treatment, examination of patient, obtaining history from patient or surrogate, ordering and performing treatments and interventions, ordering and review of laboratory studies, ordering and review of radiographic studies, pulse oximetry, re-evaluation of patient's condition, participation in multidisciplinary rounds and medical decision making of high  complexity in the care of this patient.

## 2020-11-20 NOTE — Progress Notes (Signed)
CRITICAL CARE PROGRESS NOTE    Name: [facility]. MRN: [id number] DOB: 08-May-1941     LOS: 2   SUBJECTIVE FINDINGS & SIGNIFICANT EVENTS    Patient description:  This is 80 year old male with a history of CKD 4, chronic venous insufficiency, DVT on Xarelto, peripheral artery disease, CHF seen by [facility] last transthoracic echo done December 2021, morbid obesity, obstructive sleep apnea on CPAP device at home, type 2 diabetes, Lewy body dementia with parkinsonism, chronic anemia history of total knee replacement, seizure disorder came in status post cardiac arrest.  Received ACLS 20 minutes with ROSC.  Lost pulse again and required ACLS second time followed by ROSC required endotracheal intubation in the ER.  Medical intensive care unit admission post cardiac arrest.    Evaluated patient with nephrologist together at the bedside.  Family was present during my evaluation.  They deny flulike illness malaise, myalgia he did previously have COVID-19 and recovered to baseline.  Additional review of systems reveals decreased appetite over 48 hours.  Denies diarrhea nausea vomiting or any additional symptoms.  Labs show chronic anemia, CKD metabolic acidosis with high anion gap.  Vitals without hypotension or shock.  On exam GCS 4 T with plan to perform CT head to rule out intracerebral hemorrhage followed by Arctic sun cooling protocol post cardiac arrest.     11/19/20- patient finishing Arctic sun protocol. He received several bags of PRBC. Heme/onc  workup is in process. Met with family and reviewed care plan.  11/20/20- patient is on MV post ACLS now rewarmed post TTM with sedation he is reacting to physical stimuli.    Lines/tubes : Airway 8 mm (Active)  Secured at (cm) 25 cm 11/12/2020 1207  Measured From Lips 11/16/2020  [place] 10/28/2020 1207  Secured By [employer] 11/09/2020 1207  Cuff Pressure (cm H2O) Green OR 18-26 [facility] 11/17/2020 1207  Site Condition Cool;Dry 11/11/2020 1207     Negative Pressure Wound Therapy Knee Right;Anterior (Active)     NG/OG Tube Orogastric 18 Fr. Xray;Confirmed by Surgical Manipulation 60 cm (Active)  Cm Marking at Nare/Corner of Mouth (if applicable) 60 cm 62/03/55 1211     Urethral Catheter Melissa, SN Temperature probe 16 Fr. (Active)  Indication for Insertion or Continuance of Catheter Unstable critically ill patients first 24-48 hours (See Criteria) 10/27/2020 1225  Site Assessment Clean;Intact;Dry 10/23/2020 1225  Catheter Maintenance Bag below level of bladder;Catheter secured;Drainage bag/tubing not touching floor;Insertion date on drainage bag 10/26/2020 1225  Collection Container Standard drainage bag 10/29/2020 1225  Securement Method Tape 10/25/2020 1225    Microbiology/Sepsis markers: Results for orders placed or performed during the hospital encounter of 11/07/2020  Resp Panel by RT-PCR (Flu A&B, Covid) Nasopharyngeal Swab     Status: None   Collection Time: 11/01/2020 12:07 PM   Specimen: Nasopharyngeal Swab; Nasopharyngeal(NP) swabs in vial transport medium  Result Value Ref Range Status   SARS Coronavirus 2 by RT PCR NEGATIVE NEGATIVE Final    Comment: (NOTE) SARS-CoV-2 target nucleic acids are NOT DETECTED.  The SARS-CoV-2 RNA is generally detectable in upper respiratory specimens during the acute phase of infection. The lowest concentration of SARS-CoV-2 viral copies this assay can detect is 138 copies/mL. A negative result does not preclude SARS-Cov-2 infection and should not be used as the sole basis for treatment or other patient management decisions. A negative result may occur with  improper specimen collection/handling, submission of specimen other than nasopharyngeal swab, presence of viral  mutation(s) within the areas  targeted by this assay, and inadequate number of viral copies(<138 copies/mL). A negative result must be combined with clinical observations, patient history, and epidemiological information. The expected result is Negative.  Fact Sheet for Patients:  EntrepreneurPulse.com.au  Fact Sheet for Healthcare Providers:  IncredibleEmployment.be  This test is no t yet approved or cleared by the [place] FDA and  has been authorized for detection and/or diagnosis of SARS-CoV-2 by FDA under an Emergency Use Authorization (EUA). This EUA will remain  in effect (meaning this test can be used) for the duration of the COVID-19 declaration under Section 564(b)(1) of the Act, 21 U.S.C.section 360bbb-3(b)(1), unless the authorization is terminated  or revoked sooner.       Influenza A by PCR NEGATIVE NEGATIVE Final   Influenza B by PCR NEGATIVE NEGATIVE Final    Comment: (NOTE) The Xpert Xpress SARS-CoV-2/FLU/RSV plus assay is intended as an aid in the diagnosis of influenza from Nasopharyngeal swab specimens and should not be used as a sole basis for treatment. Nasal washings and aspirates are unacceptable for Xpert Xpress SARS-CoV-2/FLU/RSV testing.  Fact Sheet for Patients: EntrepreneurPulse.com.au  Fact Sheet for Healthcare Providers: IncredibleEmployment.be  This test is not yet approved or cleared by the [place] FDA and has been authorized for detection and/or diagnosis of SARS-CoV-2 by FDA under an Emergency Use Authorization (EUA). This EUA will remain in effect (meaning this test can be used) for the duration of the COVID-19 declaration under Section 564(b)(1) of the Act, 21 U.S.C. section 360bbb-3(b)(1), unless the authorization is terminated or revoked.  Performed at [facility], [address] [place], [place] [address]   CULTURE, BLOOD (ROUTINE X 2) w Reflex to ID Panel     Status:  None (Preliminary result)   Collection Time: 11/01/2020 12:41 PM   Specimen: BLOOD  Result Value Ref Range Status   Specimen Description BLOOD LEFT ASSIST CONTROL  Final   Special Requests   Final    BOTTLES DRAWN AEROBIC AND ANAEROBIC Blood Culture adequate volume   Culture   Final    NO GROWTH 2 DAYS Performed at [facility], [address], [place], [place] [address]    Report Status PENDING  Incomplete  CULTURE, BLOOD (ROUTINE X 2) w Reflex to ID Panel     Status: None (Preliminary result)   Collection Time: 11/06/2020 12:42 PM   Specimen: BLOOD  Result Value Ref Range Status   Specimen Description BLOOD RIGHT ASSIST CONTROL  Final   Special Requests   Final    BOTTLES DRAWN AEROBIC AND ANAEROBIC Blood Culture adequate volume   Culture   Final    NO GROWTH 2 DAYS Performed at [facility], [address], [place], [place] [address]    Report Status PENDING  Incomplete  Urine culture     Status: None   Collection Time: 11/02/2020  1:36 PM   Specimen: Urine, Random  Result Value Ref Range Status   Specimen Description   Final    URINE, RANDOM Performed at [facility], [address] [place], [place] [address]    Special Requests   Final    NONE Performed at [facility], [address], [place], [place] [address]    Culture   Final    NO GROWTH Performed at [facility], [place] [address], [place], [place] [address]    Report Status 11/20/2020 FINAL  Final  MRSA PCR Screening  Status: None   Collection Time: 11/03/2020  3:55 PM   Specimen: Nasal Mucosa; Nasopharyngeal  Result Value Ref Range Status   MRSA by PCR NEGATIVE NEGATIVE Final    Comment:        The GeneXpert MRSA Assay (FDA approved for NASAL specimens only), is one component of a comprehensive MRSA colonization surveillance program. It is not intended to diagnose MRSA infection nor to guide or monitor treatment for MRSA infections. Performed at [facility], [address] [place], [place] [address]   Culture, respiratory (tracheal aspirate)     Status: None (Preliminary result)   Collection Time: 10/24/2020  4:30 PM   Specimen: Tracheal Aspirate; Respiratory  Result Value Ref Range Status   Specimen Description   Final    TRACHEAL ASPIRATE Performed at [facility], [address] [place], [place] [address]    Special Requests   Final    NONE Performed at [facility], [address] [place], [place] [address]    Gram Stain   Final    MODERATE WBC PRESENT, PREDOMINANTLY PMN NO ORGANISMS SEEN    Culture   Final    NO GROWTH < 12 HOURS Performed at [place] [address], [place], [place] [address]    Report Status PENDING  Incomplete  Respiratory (~20 pathogens) panel by PCR     Status: None   Collection Time: 11/01/2020  4:30 PM  Result Value Ref Range Status   Adenovirus NOT DETECTED NOT DETECTED Final   Coronavirus 229E NOT DETECTED NOT DETECTED Final    Comment: (NOTE) The Coronavirus on the Respiratory Panel, DOES NOT test for the novel  Coronavirus (2019 nCoV)    Coronavirus HKU1 NOT DETECTED NOT DETECTED Final   Coronavirus NL63 NOT DETECTED NOT DETECTED Final   Coronavirus OC43 NOT DETECTED NOT DETECTED Final   Metapneumovirus NOT DETECTED NOT DETECTED Final   Rhinovirus / Enterovirus NOT DETECTED NOT DETECTED Final   Influenza A NOT DETECTED NOT DETECTED Final   Influenza B NOT DETECTED NOT DETECTED Final   Parainfluenza Virus 1 NOT DETECTED NOT DETECTED Final   Parainfluenza Virus 2 NOT DETECTED NOT DETECTED Final   Parainfluenza Virus 3 NOT DETECTED NOT DETECTED Final   Parainfluenza Virus 4 NOT DETECTED NOT DETECTED Final   Respiratory Syncytial Virus NOT DETECTED NOT DETECTED Final   Bordetella pertussis NOT DETECTED NOT DETECTED Final   Bordetella Parapertussis NOT DETECTED NOT DETECTED Final   Chlamydophila pneumoniae NOT DETECTED NOT DETECTED Final   Mycoplasma  pneumoniae NOT DETECTED NOT DETECTED Final    Comment: Performed at [facility], [place], [place],  [address]    Anti-infectives:  Anti-infectives (From admission, onward)   Start     Dose/Rate Route Frequency Ordered Stop   10/30/2020 2200  piperacillin-tazobactam (ZOSYN) IVPB 3.375 g  Status:  Discontinued        3.375 g 12.5 mL/hr over 240 Minutes Intravenous Every 8 hours 11/17/2020 1424 11/19/20 1151   11/03/2020 1430  vancomycin (VANCOREADY) IVPB 2000 mg/400 mL        2,000 mg 200 mL/hr over 120 Minutes Intravenous  Once 11/17/2020 1315 11/12/2020 1831   11/10/2020 1424  vancomycin variable dose per unstable renal function (pharmacist dosing)  Status:  Discontinued         Does not apply See admin instructions 11/20/2020 1424 11/19/20 1151   11/19/2020 1300  piperacillin-tazobactam (ZOSYN) IVPB 3.375 g  3.375 g 100 mL/hr over 30 Minutes Intravenous  Once 11/20/2020 1248 11/01/2020 1328   10/26/2020 1300  azithromycin (ZITHROMAX) 500 mg in sodium chloride 0.9 % 250 mL IVPB  Status:  Discontinued        500 mg 250 mL/hr over 60 Minutes Intravenous  Once 11/01/2020 1248 10/22/2020 1423        PAST MEDICAL HISTORY   Past Medical History:  Diagnosis Date  . (HFpEF) heart failure with preserved ejection fraction ([place])    a. 05/2020 Echo: EF >55%, Gr2 DD. Mildly enlarged RV. Mild LAE. Mild MR.  . Arthritis   . Bilateral swelling of feet   . BPH (benign prostatic hyperplasia)   . CKD (chronic kidney disease), stage III ([place])   . COVID-19 virus infection 06/2019  . Dementia ([place])    a. requires 24 hr care  . Essential hypertension   . Gout   . Left Foot Tendonitis   . Right Lower Extremity DVT (deep venous thrombosis) ([place])    a. On Xarelto; s/p IVC filter.  . Seizures ([place])    a. on keppra  . Sleep apnea      SURGICAL HISTORY   Past Surgical History:  Procedure Laterality Date  . APPENDECTOMY    . APPLICATION OF WOUND VAC Right 11/02/2019   Procedure: APPLICATION  OF WOUND VAC;  Surgeon: Dereck Leep, MD;  Location: [facility] ORS;  Service: Orthopedics;  Laterality: Right;  [id number]  . HERNIA REPAIR     UMBILICAL  . IVC FILTER INSERTION N/A 10/25/2019   Procedure: IVC FILTER INSERTION;  Surgeon: Katha Cabal, MD;  Location: [facility];  Service: Cardiovascular;  Laterality: N/A;  . KNEE ARTHROPLASTY Right 11/02/2019   Procedure: COMPUTER ASSISTED TOTAL KNEE ARTHROPLASTY;  Surgeon: Dereck Leep, MD;  Location: [facility] ORS;  Service: Orthopedics;  Laterality: Right;  . PATELLAR TENDON REPAIR Right 12/05/2019   Procedure: PATELLA TENDON REPAIR;  Surgeon: Dereck Leep, MD;  Location: [facility] ORS;  Service: Orthopedics;  Laterality: Right;  . PROSTATE SURGERY       FAMILY HISTORY   Family History  Problem Relation Age of Onset  . Arthritis Mother   . Hypertension Mother   . Diabetes Brother      SOCIAL HISTORY   Social History   Tobacco Use  . Smoking status: Former Smoker    Packs/day: 1.00    Years: 10.00    Pack years: 10.00    Types: Cigarettes    Quit date: 06/23/1968    Years since quitting: 52.4  . Smokeless tobacco: Never Used  Vaping Use  . Vaping Use: Never used  Substance Use Topics  . Alcohol use: No  . Drug use: No     MEDICATIONS   Current Medication:  Current Facility-Administered Medications:  .  0.9 %  sodium chloride infusion, , Intravenous, Continuous, Rust-Chester, Britton L, NP, Last Rate: 10 mL/hr at 11/20/20 0700, Infusion Verify at 11/20/20 0700 .  bacitracin ointment, , Topical, BID, Rust-Chester, Huel Cote, NP, Given at 11/19/20 2226 .  chlorhexidine gluconate (MEDLINE KIT) (PERIDEX) 0.12 % solution 15 mL, 15 mL, Mouth Rinse, BID, Lanney Gins, Teyon Odette, MD, 15 mL at 11/20/20 0728 .  Chlorhexidine Gluconate Cloth 2 % PADS 6 each, 6 each, Topical, Q0600, Ottie Glazier, MD, 6 each at 11/19/20 1002 .  docusate sodium (COLACE) capsule 100 mg, 100 mg, Oral, BID PRN, Lanney Gins, Diogenes Whirley, MD .  fentaNYL  (SUBLIMAZE) bolus via infusion 50-100 mcg, 50-100 mcg,  Intravenous, Q2H PRN, Rust-Chester, Britton L, NP, 50 mcg at 11/19/20 2211 .  fentaNYL 2527mg in NS 254m(1072mml) infusion-PREMIX, 100-300 mcg/hr, Intravenous, Continuous, Rust-Chester, Britton L, NP, Last Rate: 15 mL/hr at 11/20/20 0700, 150 mcg/hr at 11/20/20 0700 .  [COMPLETED] levETIRAcetam (KEPPRA) IVPB 1000 mg/100 mL premix, 1,000 mg, Intravenous, Once, Stopped at 11/17/2020 1848 **FOLLOWED BY** levETIRAcetam (KEPPRA) IVPB 500 mg/100 mL premix, 500 mg, Intravenous, Q12H, StaDerek JackD, Stopped at 11/20/20 0430 .  MEDLINE mouth rinse, 15 mL, Mouth Rinse, 10 times per day, AleOttie GlazierD, 15 mL at 11/20/20 0532 .  midazolam (VERSED) 50 mg in dextrose 5 % 50 mL (1 mg/mL) infusion, 0.5-10 mg/hr, Intravenous, Continuous, AleLanney Ginsuad, MD, Last Rate: 4 mL/hr at 11/20/20 0700, 4 mg/hr at 11/20/20 0700 .  midazolam (VERSED) bolus via infusion 2-4 mg, 2-4 mg, Intravenous, Q1H PRN, Rust-Chester, Britton L, NP .  norepinephrine (LEVOPHED) 4mg22m 250mL71mmix infusion, 0-50 mcg/min, Intravenous, Titrated, Rust-Chester, Britton L, NP, Last Rate: 15 mL/hr at 11/20/20 0743, 4 mcg/min at 11/20/20 0743 .  pantoprazole (PROTONIX) injection 40 mg, 40 mg, Intravenous, QHS, Rust-Chester, Britton L, NP, 40 mg at 11/19/20 2204 .  polyethylene glycol (MIRALAX / GLYCOLAX) packet 17 g, 17 g, Per Tube, Daily PRN, AleskOttie Glazier   ALLERGIES   Shellfish allergy, Iodinated diagnostic agents, Nsaids, and Rivastigmine    REVIEW OF SYSTEMS     Unable to perform due to mechanical ventilation and acutely comatose state  PHYSICAL EXAMINATION   Vital Signs: Temp:  [96.8 F (36 C)-98.6 F (37 C)] 98.6 F (37 C) (05/31 0700) Pulse Rate:  [66-91] 82 (05/31 0700) Resp:  [15-34] 15 (05/31 0700) BP: (113-142)/(51-65) 120/57 (05/31 0700) SpO2:  [91 %-100 %] 97 % (05/31 0700) FiO2 (%):  [28 %] 28 % (05/31 0700) Weight:  [141.7 kg] 141.7 kg  (05/31 0444)  GENERAL: Morbidly obese age-appropriate, acutely comatose HEAD: Normocephalic, atraumatic.  EYES: Pupils equal, round, reactive to light.  No scleral icterus.  MOUTH: Moist mucosal membrane. NECK: Supple. No thyromegaly. No nodules. No JVD.  PULMONARY: Rhonchorous breath sounds with crackles in all lung zones bilaterally CARDIOVASCULAR: S1 and S2. Regular rate and rhythm. No murmurs, rubs, or gallops.  GASTROINTESTINAL: Soft, nontender, non-distended. No masses. Positive bowel sounds. No hepatosplenomegaly.  Obese abdomen MUSCULOSKELETAL: No swelling, clubbing, or edema.  NEUROLOGIC: GCS 4 T SKIN:intact,warm,dry   PERTINENT DATA     Infusions: . sodium chloride 10 mL/hr at 11/20/20 0700  . fentaNYL infusion INTRAVENOUS 150 mcg/hr (11/20/20 0700)  . levETIRAcetam Stopped (11/20/20 0430)  . midazolam 4 mg/hr (11/20/20 0700)  . norepinephrine (LEVOPHED) Adult infusion 4 mcg/min (11/20/20 0743)   Scheduled Medications: . bacitracin   Topical BID  . chlorhexidine gluconate (MEDLINE KIT)  15 mL Mouth Rinse BID  . Chlorhexidine Gluconate Cloth  6 each Topical Q0600  . mouth rinse  15 mL Mouth Rinse 10 times per day  . pantoprazole (PROTONIX) IV  40 mg Intravenous QHS   PRN Medications: docusate sodium, fentaNYL, midazolam, polyethylene glycol Hemodynamic parameters:   Intake/Output: 05/30 0701 - 05/31 0700 In: 2743.8 [I.V.:1183.8; Blood:1360; IV [id number]:188] 2935 [Urine:2935]  Ventilator  Settings: Vent Mode: PRVC FiO2 (%):  [28 %] 28 % Set Rate:  [16 bmp] 16 bmp Vt Set:  [500 mL] 500 mL PEEP:  [5 cmH20] 5 cmH20 Plateau Pressure:  [0 cmH20] 0 cmH20   LAB RESULTS:  Basic Metabolic Panel: Recent Labs  Lab 11/09/2020 1207 10/27/2020 2032  11/19/20 0459 11/19/20 1647 11/20/20 0428  NA 144 144 145 145 147*  K 4.8 5.6* 5.2* 4.5 4.1  CL 111 112* 115* 112* 116*  CO2 19* _0 GLUCOSE 175* 144* 123* 100* 86  BUN 99* 106* 112* 105* 98*   CREATININE 3.18* 3.42* 3.60* 3.75* 3.83*  CALCIUM 9.0 8.3* 8.0* 8.1* 8.1*  MG 2.6* 2.2 2.4  --  1.9  PHOS  --  5.4* 5.9*  --  4.6   Liver Function Tests: Recent Labs  Lab 11/13/2020 1207  AST 179*  ALT 159*  ALKPHOS 108  BILITOT 0.5  PROT 5.6*  ALBUMIN 2.8*   No results for input(s): LIPASE, AMYLASE in the last 168 hours. No results for input(s): AMMONIA in the last 168 hours. CBC: Recent Labs  Lab 11/16/2020 1241 11/10/2020 1336 11/08/2020 1812 11/19/20 0459 11/19/20 1647 11/20/20 0428  WBC 12.7* 13.5*  --  11.4*  --  11.0*  NEUTROABS  --  12.0*  --   --   --   --   HGB 2.5* 2.5* 4.5* 6.5* 8.4* 7.3*  HCT 8.0* 7.9* 14.1* 18.9*  --  21.9*  MCV 67.2* 68.1*  --  80.1  --  80.5  PLT 153 151  --  108*  --  92*   Cardiac Enzymes: No results for input(s): CKTOTAL, CKMB, CKMBINDEX, TROPONINI in the last 168 hours. BNP: Invalid input(s): POCBNP CBG: Recent Labs  Lab 11/19/20 1603 11/19/20 1930 11/19/20 2331 11/20/20 0345 11/20/20 0726  GLUCAP 95 88 84 81 84       IMAGING RESULTS:  Imaging: CT HEAD WO CONTRAST  Result Date: 11/14/2020 CLINICAL DATA:  Suspected stroke. EXAM: CT HEAD WITHOUT CONTRAST TECHNIQUE: Contiguous axial images were obtained from the base of the skull through the vertex without intravenous contrast. COMPARISON:  04/20/2021 FINDINGS: Brain: No evidence of acute infarction, hemorrhage, hydrocephalus, extra-axial collection or mass lesion/mass effect. Moderate brain parenchymal volume loss and deep white matter microangiopathy. 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 Vascular: Calcific atherosclerotic disease of the intra cavernous carotid arteries. Skull: Normal. Negative for fracture or focal lesion. Sinuses/Orbits: No acute finding. Other: None. IMPRESSION: 1. No acute intracranial abnormality. 2. Atrophy, chronic microvascular disease. Electronically Signed   By: Fidela Salisbury M.D.   On: 10/28/2020 14:26    DG Chest Port 1 View  Result Date: 11/17/2020 CLINICAL DATA:  80 year old male with central line placement. EXAM: PORTABLE CHEST 1 VIEW COMPARISON:  Earlier chest radiograph dated 11/07/2020 and CT dated 11/01/2020 FINDINGS: Interval placement of a right IJ central venous line with tip close to the cavoatrial junction or over the right atrium. Endotracheal tube approximately 2 cm above the carina. The tube can be pulled back by 2 cm for optimal positioning. Enteric tube with tip in the left upper abdomen likely in the gastric fundus. Shallow inspiration with bilateral streaky densities similar to prior radiograph. No large pleural effusion or pneumothorax. Stable cardiomegaly. Atherosclerotic calcification of the aorta. No acute osseous pathology. IMPRESSION: 1. Interval placement of a right IJ central venous line with tip close to the cavoatrial junction. No pneumothorax. 2. Endotracheal tube above the carina. The tube can be pulled back by 2 cm for optimal positioning. 3. No interval change in bilateral pulmonary opacities. Electronically Signed   By: Anner Crete M.D.   On: 10/27/2020 15:27   DG Chest Portable 1 View  Result Date: 11/12/2020 CLINICAL DATA:  80 year old male status post intubation.  EXAM: PORTABLE CHEST 1 VIEW COMPARISON:  Chest x-ray 06/28/2020. FINDINGS: An endotracheal tube is in place with tip 2.5 cm above the carina. A nasogastric tube is seen extending into the stomach, however, the tip of the nasogastric tube extends below the lower margin of the image. Transcutaneous defibrillator pads project over the lower left hemithorax. Lung volumes are low. Cephalization of the pulmonary vasculature. Airspace consolidation in the right upper lobe and periphery of the right lung base. Left lung otherwise appears clear. No definite pleural effusions. No pneumothorax. Heart size is mildly enlarged. Upper mediastinal contours are distorted by patient positioning. Aortic atherosclerosis.  IMPRESSION: 1. Support apparatus, as above. 2. Right upper lobe airspace consolidation and additional consolidative changes in the right lung base concerning for multilobar pneumonia. 3. Cardiomegaly with pulmonary venous congestion. 4. Aortic atherosclerosis. Electronically Signed   By: Vinnie Langton M.D.   On: 10/27/2020 12:29   DG Abd Portable 1 View  Result Date: 11/09/2020 CLINICAL DATA:  80 year old male status post intubation. EXAM: PORTABLE ABDOMEN - 1 VIEW COMPARISON:  No priors. FINDINGS: Nasogastric tube in the mid stomach. IVC filter projecting over the right mid abdomen. Gas and stool are seen scattered throughout the colon extending to the level of the distal rectum. No pathologic distension of small bowel is noted. No gross evidence of pneumoperitoneum. IMPRESSION: 1. Support apparatus, as above. 2. Nonobstructive bowel gas pattern. Electronically Signed   By: Vinnie Langton M.D.   On: 11/19/2020 12:31   EEG adult  Result Date: 11/19/2020 Lora Havens, MD     11/19/2020  3:21 PM Patient Name: [name]. MRN: [id number] Epilepsy Attending: Lora Havens Referring Physician/Provider: Olga Millers, NP Date: 11/19/2020 Duration: 22.29 mins Patient history: 80yo M with witnessed focal seizure activity post cardiac arrest. EEG to evaluate for seizure Level of alertness: comatose AEDs during EEG study: LEV, versed Technical aspects: This EEG study was done with scalp electrodes positioned according to the 10-20 International system of electrode placement. Electrical activity was acquired at a sampling rate of _0  and reviewed with a high frequency filter of _1  and a low frequency filter of _2 . EEG data were recorded continuously and digitally stored. Description: EEG showed near continuous generalized 3 to 6 Hz theta-delta slowing with brief periods of generalized attenuation lasting 1-3 seconds. Hyperventilation and photic stimulation were not performed.   ABNORMALITY  - Continuous slow, generalized IMPRESSION: This study is suggestive of severe diffuse encephalopathy, nonspecific etiology. No seizures or epileptiform discharges were seen throughout the recording. Lora Havens   ECHOCARDIOGRAM COMPLETE  Result Date: 10/29/2020    ECHOCARDIOGRAM REPORT   Patient Name:   [facility] Date of Exam: 11/20/2020 Medical Rec #:  [id number]          Height:       70.5 in Accession #:    [id number]         Weight:       314.8 lb Date of Birth:  1940/09/12           BSA:          2.545 m Patient Age:    17 years           BP:           134/69 mmHg Patient Gender: M                  HR:           92 bpm. Exam  Location:  [facility] Procedure: 2D Echo and Intracardiac Opacification Agent Indications:     Cardiac arrest I46.9  History:         Patient has no prior history of Echocardiogram examinations.  Sonographer:     Kathlen Brunswick RDCS Referring Phys:  [id number] Ottie Glazier Diagnosing Phys: Ida Rogue MD  Sonographer Comments: Technically challenging study due to limited acoustic windows, suboptimal subcostal window and echo performed with patient supine and on artificial respirator. Image acquisition challenging due to respiratory motion and Image acquisition challenging due to patient body habitus. IMPRESSIONS  1. Left ventricular ejection fraction, by estimation, is 60 to 65%. The left ventricle has normal function. The left ventricle has no regional wall motion abnormalities. There is moderate left ventricular hypertrophy. Left ventricular diastolic parameters are consistent with Grade I diastolic dysfunction (impaired relaxation).  2. Right ventricular systolic function is normal. The right ventricular size is normal. Tricuspid regurgitation signal is inadequate for assessing PA pressure.  3. The mitral valve is normal in structure. No evidence of mitral valve regurgitation. No evidence of mitral stenosis. FINDINGS  Left Ventricle: Left ventricular ejection fraction, by  estimation, is 60 to 65%. The left ventricle has normal function. The left ventricle has no regional wall motion abnormalities. Definity contrast agent was given IV to delineate the left ventricular  endocardial borders. The left ventricular internal cavity size was normal in size. There is moderate left ventricular hypertrophy. Left ventricular diastolic parameters are consistent with Grade I diastolic dysfunction (impaired relaxation). Right Ventricle: The right ventricular size is normal. No increase in right ventricular wall thickness. Right ventricular systolic function is normal. Tricuspid regurgitation signal is inadequate for assessing PA pressure. Left Atrium: Left atrial size was normal in size. Right Atrium: Right atrial size was normal in size. Pericardium: There is no evidence of pericardial effusion. Mitral Valve: The mitral valve is normal in structure. No evidence of mitral valve regurgitation. No evidence of mitral valve stenosis. Tricuspid Valve: The tricuspid valve is normal in structure. Tricuspid valve regurgitation is mild . No evidence of tricuspid stenosis. Aortic Valve: The aortic valve is normal in structure. Aortic valve regurgitation is not visualized. No aortic stenosis is present. Aortic valve mean gradient measures 7.0 mmHg. Aortic valve peak gradient measures 14.7 mmHg. Pulmonic Valve: The pulmonic valve was normal in structure. Pulmonic valve regurgitation is not visualized. No evidence of pulmonic stenosis. Aorta: The aortic root is normal in size and structure. Venous: The inferior vena cava is normal in size with greater than 50% respiratory variability, suggesting right atrial pressure of 3 mmHg. IAS/Shunts: No atrial level shunt detected by color flow Doppler.  LEFT VENTRICLE PLAX 2D LVIDd:         3.66 cm Diastology LVIDs:         2.60 cm LV e' medial:    9.03 cm/s LV PW:         1.34 cm LV E/e' medial:  7.6 LV IVS:        1.30 cm LV e' lateral:   11.30 cm/s                         LV E/e' lateral: 6.1  RIGHT VENTRICLE RV S prime:     13.75 cm/s TAPSE (M-mode): 3.7 cm LEFT ATRIUM           Index       RIGHT ATRIUM           Index LA  diam:      4.00 cm 1.57 cm/m  RA Area:     13.10 cm LA Vol (A4C): 52.7 ml 20.71 ml/m RA Volume:   33.30 ml  13.09 ml/m  AORTIC VALVE AV Vmax:           191.50 cm/s AV Vmean:          121.000 cm/s AV VTI:            0.317 m AV Peak Grad:      14.7 mmHg AV Mean Grad:      7.0 mmHg LVOT Vmax:         131.00 cm/s LVOT Vmean:        83.700 cm/s LVOT VTI:          0.249 m LVOT/AV VTI ratio: 0.79 MITRAL VALVE               TRICUSPID VALVE MV Area (PHT): 3.68 cm    TV Peak grad:   29.1 mmHg MV Decel Time: 206 msec    TV Vmax:        2.70 m/s MV E velocity: 68.40 cm/s MV A velocity: 98.50 cm/s  SHUNTS MV E/A ratio:  0.69        Systemic VTI: 0.25 m Ida Rogue MD Electronically signed by Ida Rogue MD Signature Date/Time: 11/15/2020/4:34:13 PM    Final    CT CHEST ABDOMEN PELVIS WO CONTRAST  Result Date: 11/07/2020 CLINICAL DATA:  Pt arrived ems from home. Pt was at home and collapsed and CPR was started. 18 mins of CPR. 3 epi, 2 bicarb, 1 calcium. ROSC achieved. While en route, pt lost pulses again but after 2 round of CPR ROSC was obtained again with history of diabetes, peripheral vascular disease with nonhealing ulcers on the right lower extremity, hypertension, congestive heart failure, BPH, history of DVT, history of COVID infection, dementia and chronic kidney disease stage IIIb with a baseline creatinine of about 2.4 as of 2 weeks ago. Patient sees Dr. Murlean Iba in the office. He was last seen in last month.As per the family while sitting in the car he became on responsive. The EMT came and did CPR. Patient was unresponsive for about 20 minutes finally he regained pulse. Patient is now intubated and sedated EXAM: CT CHEST, ABDOMEN AND PELVIS WITHOUT CONTRAST TECHNIQUE: Multidetector CT imaging of the chest, abdomen and pelvis was performed  following the standard protocol without IV contrast. Examlimited by lack of intravenous and oral contrast and due to artifact from the arms being at patient's side. COMPARISON:  07/21/2020. FINDINGS: CT CHEST FINDINGS Cardiovascular: Heart is normal in size and configuration. No pericardial effusion. Great vessels normal in caliber. Mild aortic atherosclerosis. Mediastinum/Nodes: Endotracheal tube tip projects at the upper level of the carina. Nasal/orogastric tube passes below the diaphragm, well within the stomach. No neck base, axillary, mediastinal or hilar masses or enlarged lymph nodes. Trachea and esophagus are grossly unremarkable. Lungs/Pleura: Small pleural effusions. Bilateral patchy areas of ground-glass and more confluent opacity, most evident in the right upper lobe. Additional opacity is noted dependently in the lower lobes consistent with atelectasis. Mild interstitial thickening bilaterally. No mass. No suspicious nodule. No pneumothorax. Musculoskeletal: Subtle nondisplaced fractures of the anterior right 7 anterior left sixth ribs. No other fractures. No bone lesions. CT ABDOMEN PELVIS FINDINGS Hepatobiliary: Limited assessment of the from the liver and gallbladder due to artifact arms. Liver normal in size. No convincing mass. Gallbladder unremarkable. No evidence duct dilation. Pancreas:  Assessment also somewhat limited by artifact from the arms. No mass or inflammation. Spleen: Normal in size without focal abnormality. Adrenals/Urinary Tract: No adrenal masses. Kidneys normal in orientation and position. Areas of renal cortical thinning, likely scarring. No convincing renal mass, no stone and no hydronephrosis. Ureters not well visualized, but no evidence of a ureteral stone or ureteral dilation. Bladder is decompressed with a Foley catheter. Stomach/Bowel: Stomach is unremarkable. Small bowel and colon are normal in caliber. No wall thickening. No inflammation. Scattered left colon  diverticula. Mild increase in the colonic stool burden. No evidence of appendicitis. Vascular/Lymphatic: Stable vena cava filter. Single tiny focus of aortic atherosclerotic calcification. No aneurysm. No enlarged lymph nodes. Reproductive: Unremarkable. Other: No abdominal wall hernia.  No ascites. Musculoskeletal: No fracture or acute finding.  No bone lesion. IMPRESSION: CT CHEST 1. Patchy bilateral ground-glass and confluent airspace opacities, most evident in the right upper lobe, which could be the result CPR, but is more suggestive of multifocal pneumonia. 2. Nondisplaced fractures of the anterior right seventh and anterior left sixth ribs presumably occurring during CPR. 3. Dependent opacity in both lower lobes consistent with atelectasis. Small effusions. ABDOMEN AND PELVIS CT 1. No acute findings within the abdomen or pelvis. Electronically Signed   By: Lajean Manes M.D.   On: 11/10/2020 14:30   _0 @ EEG adult  Result Date: 11/19/2020 Lora Havens, MD     11/19/2020  3:21 PM Patient Name: [name]. MRN: [id number] Epilepsy Attending: Lora Havens Referring Physician/Provider: Olga Millers, NP Date: 11/19/2020 Duration: 22.29 mins Patient history: 80yo M with witnessed focal seizure activity post cardiac arrest. EEG to evaluate for seizure Level of alertness: comatose AEDs during EEG study: LEV, versed Technical aspects: This EEG study was done with scalp electrodes positioned according to the 10-20 International system of electrode placement. Electrical activity was acquired at a sampling rate of _1  and reviewed with a high frequency filter of _2  and a low frequency filter of _3 . EEG data were recorded continuously and digitally stored. Description: EEG showed near continuous generalized 3 to 6 Hz theta-delta slowing with brief periods of generalized attenuation lasting 1-3 seconds. Hyperventilation and photic stimulation were not performed.   ABNORMALITY -  Continuous slow, generalized IMPRESSION: This study is suggestive of severe diffuse encephalopathy, nonspecific etiology. No seizures or epileptiform discharges were seen throughout the recording. [place]     ASSESSMENT AND PLAN    -Multidisciplinary rounds held today  Acute hypoxemic respiratory failure - present on admission  - COVID19- pending - on mechanical ventilation  - will perform infectious workup for pneumonia -Respiratory viral panel -serum fungitell -legionella ab -strep pneumoniae ur AG -Histoplasma Ur Ag -sputum resp cultures -AFB sputum expectorated specimen -sputum cytology  -reviewed pertinent imaging with patient today - ESR -PT/OT for d/c planning  -please encourage patient to use incentive spirometer few times each hour while hospitalized.     Cardiac Arrest PEA - ACLS w ROSC   - cardiology consult - reviewed patient with cardiology team - [place] sun initated -holding xarelto  -holding asa  -CT head Stat.  -follow up cardiac enzymes as indicated ICU monitoring   Severe anemia   - possible due to acute blood loss  -transfuse blood - patient does not have religions restrictions   - MCV 60s suggestive of possible underlying thalasemia/ssD   -G6PD testing   - haptoglobin   -pathology slide review   - CKD 4 - may be EPO candidate -  heme/onc evaluation  Renal Failure-Acute on chronic stage -4 -follow chem 7 -follow UO -continue Foley Catheter-assess need daily -Nephrology on case - Dr Lanora Manis - -appreciate input +casts   Acutely comatose state - intubated emergently on mechanical ventilation  -will check CT head for ICH - minimal sedation to achieve a RASS goal: -1 Wake up assessment pending   ID -continue IV abx as prescibed-empirically treating for infection -follow up cultures  GI/Nutrition GI PROPHYLAXIS as indicated DIET-->TF's as tolerated Constipation protocol as indicated  ENDO - ICU  hypoglycemic\Hyperglycemia protocol -check FSBS per protocol   ELECTROLYTES -follow labs as needed -replace as needed -pharmacy consultation   DVT/GI PRX ordered -SCDs  TRANSFUSIONS AS NEEDED MONITOR FSBS ASSESS the need for LABS as needed   Critical care provider statement:    Critical care time (minutes):  33   Critical care time was exclusive of:  Separately billable procedures and treating other patients   Critical care was necessary to treat or prevent imminent or life-threatening deterioration of the following conditions:  acutely comatose, cardiac arrest, Acute on chronic renal failure stage 4   Critical care was time spent personally by me on the following activities:  Development of treatment plan with patient or surrogate, discussions with consultants, evaluation of patient's response to treatment, examination of patient, obtaining history from patient or surrogate, ordering and performing treatments and interventions, ordering and review of laboratory studies and re-evaluation of patient's condition.  I assumed direction of critical care for this patient from another provider in my specialty: no    This document was prepared using Dragon voice recognition software and may include unintentional dictation errors.    Ottie Glazier, M.D.  Division of [place]

## 2020-11-20 NOTE — Progress Notes (Signed)
Patient remains on targeted temperature management,intubated and sedated with Versed and Fentanyl. Levophed infusing at 5 mcg/min to maintain a MAP >70. Patient reached re-warming targeted temperature on 11/19/2020 at 2114 and has maintained that temperature the remainder of this shift. Troponin level rechecked, trending down at 135. Hgb down to 7.3, new orders entered to recheck H/H at 1000 and then q6 hrs. BUN and creatinine continue to be elevated but improvement noted. CBG's WNL this shift. Patient does not follow commands but does respond to pain, NSR on cardiac monitor, currently on 28% FiO2, OG is to LWIS per order, foley catheter draining clear yellow urine, adequate output noted this shift. Continue to assess and monitor patient closely.

## 2020-11-20 NOTE — Progress Notes (Signed)
1800 Normothermic x 21 hours. Levophed weaned off and Versed weaned to 2 mg/hr. Noted to have short burst of tachy penia but quickly slows to 20-25 respirations. Alert at times and opens eyes but does not appear to focus. Pupils large, irregular and non reactive all day. Family in room most of day. Code status changed to intubation or Bi-pap/CPAP only. Neurologist in this morning to explain poor prognosis to family due to age and co- morbilities. Remains in NSR BBB.

## 2020-11-21 ENCOUNTER — Inpatient Hospital Stay: Payer: Medicare Other

## 2020-11-21 DIAGNOSIS — Z515 Encounter for palliative care: Secondary | ICD-10-CM

## 2020-11-21 DIAGNOSIS — G931 Anoxic brain damage, not elsewhere classified: Secondary | ICD-10-CM

## 2020-11-21 DIAGNOSIS — Z7189 Other specified counseling: Secondary | ICD-10-CM

## 2020-11-21 LAB — CBC WITH DIFFERENTIAL/PLATELET
Abs Immature Granulocytes: 0.07 10*3/uL (ref 0.00–0.07)
Basophils Absolute: 0 10*3/uL (ref 0.0–0.1)
Basophils Relative: 0 %
Eosinophils Absolute: 0.1 10*3/uL (ref 0.0–0.5)
Eosinophils Relative: 1 %
HCT: 20.9 % — ABNORMAL LOW (ref 39.0–52.0)
Hemoglobin: 7 g/dL — ABNORMAL LOW (ref 13.0–17.0)
Immature Granulocytes: 1 %
Lymphocytes Relative: 9 %
Lymphs Abs: 0.9 10*3/uL (ref 0.7–4.0)
MCH: 27.6 pg (ref 26.0–34.0)
MCHC: 33.5 g/dL (ref 30.0–36.0)
MCV: 82.3 fL (ref 80.0–100.0)
Monocytes Absolute: 0.7 10*3/uL (ref 0.1–1.0)
Monocytes Relative: 7 %
Neutro Abs: 8.3 10*3/uL — ABNORMAL HIGH (ref 1.7–7.7)
Neutrophils Relative %: 82 %
Platelets: 78 10*3/uL — ABNORMAL LOW (ref 150–400)
RBC: 2.54 MIL/uL — ABNORMAL LOW (ref 4.22–5.81)
RDW: 22.7 % — ABNORMAL HIGH (ref 11.5–15.5)
Smear Review: NORMAL
WBC: 10 10*3/uL (ref 4.0–10.5)
nRBC: 0.5 % — ABNORMAL HIGH (ref 0.0–0.2)

## 2020-11-21 LAB — GLUCOSE, CAPILLARY
Glucose-Capillary: 123 mg/dL — ABNORMAL HIGH (ref 70–99)
Glucose-Capillary: 68 mg/dL — ABNORMAL LOW (ref 70–99)
Glucose-Capillary: 85 mg/dL (ref 70–99)
Glucose-Capillary: 99 mg/dL (ref 70–99)
Glucose-Capillary: 105 mg/dL — ABNORMAL HIGH (ref 70–99)
Glucose-Capillary: 103 mg/dL — ABNORMAL HIGH (ref 70–99)
Glucose-Capillary: 137 mg/dL — ABNORMAL HIGH (ref 70–99)
Glucose-Capillary: 131 mg/dL — ABNORMAL HIGH (ref 70–99)

## 2020-11-21 LAB — HEMOGLOBIN AND HEMATOCRIT, BLOOD
HCT: 21.6 % — ABNORMAL LOW (ref 39.0–52.0)
HCT: 24 % — ABNORMAL LOW (ref 39.0–52.0)
HCT: 23.1 % — ABNORMAL LOW (ref 39.0–52.0)
Hemoglobin: 7 g/dL — ABNORMAL LOW (ref 13.0–17.0)
Hemoglobin: 7.7 g/dL — ABNORMAL LOW (ref 13.0–17.0)
Hemoglobin: 7.4 g/dL — ABNORMAL LOW (ref 13.0–17.0)

## 2020-11-21 LAB — BASIC METABOLIC PANEL
Anion gap: 8 (ref 5–15)
BUN: 92 mg/dL — ABNORMAL HIGH (ref 8–23)
CO2: 22 mmol/L (ref 22–32)
Calcium: 8.3 mg/dL — ABNORMAL LOW (ref 8.9–10.3)
Chloride: 119 mmol/L — ABNORMAL HIGH (ref 98–111)
Creatinine, Ser: 3.88 mg/dL — ABNORMAL HIGH (ref 0.61–1.24)
GFR, Estimated: 15 mL/min — ABNORMAL LOW (ref >60)
Glucose, Bld: 77 mg/dL (ref 70–99)
Potassium: 4 mmol/L (ref 3.5–5.1)
Sodium: 149 mmol/L — ABNORMAL HIGH (ref 135–145)

## 2020-11-21 LAB — MAGNESIUM: Magnesium: 2.3 mg/dL (ref 1.7–2.4)

## 2020-11-21 LAB — PHOSPHORUS: Phosphorus: 4.7 mg/dL — ABNORMAL HIGH (ref 2.5–4.6)

## 2020-11-21 MED ORDER — DEXTROSE-NACL 5-0.9 % IV SOLN: INTRAVENOUS | Status: DC

## 2020-11-21 MED ORDER — MIDAZOLAM HCL 2 MG/2ML IJ SOLN
INTRAMUSCULAR | Status: AC
  Filled 2020-11-21: qty 2

## 2020-11-21 MED ORDER — FENTANYL CITRATE (PF) 100 MCG/2ML IJ SOLN
INTRAMUSCULAR | Status: AC
  Filled 2020-11-21: qty 2

## 2020-11-21 MED ORDER — FREE WATER
30.0000 mL | Status: DC
  Administered 2020-11-21 – 2020-11-22 (×7): 30 mL

## 2020-11-21 MED ORDER — FENTANYL CITRATE (PF) 100 MCG/2ML IJ SOLN: 50.0000 ug | INTRAMUSCULAR | Status: DC | PRN

## 2020-11-21 MED ORDER — VITAL HIGH PROTEIN PO LIQD
1000.0000 mL | ORAL | Status: DC
  Administered 2020-11-21 – 2020-11-22 (×4): 1000 mL

## 2020-11-21 MED ORDER — PROSOURCE TF PO LIQD
45.0000 mL | Freq: Two times a day (BID) | ORAL | Status: DC
  Administered 2020-11-21 – 2020-11-22 (×3): 45 mL
  Filled 2020-11-21 (×5): qty 45

## 2020-11-21 MED ORDER — DEXTROSE 50 % IV SOLN
1.0000 | Freq: Once | INTRAVENOUS | Status: AC
  Administered 2020-11-21: 50 mL via INTRAVENOUS

## 2020-11-21 MED ORDER — DEXTROSE 5 % IV SOLN: INTRAVENOUS | Status: DC

## 2020-11-21 MED ORDER — DEXTROSE 50 % IV SOLN
INTRAVENOUS | Status: AC
  Filled 2020-11-21: qty 50

## 2020-11-21 MED ORDER — MIDAZOLAM HCL 2 MG/2ML IJ SOLN: 2.0000 mg | INTRAMUSCULAR | Status: DC | PRN

## 2020-11-21 NOTE — Progress Notes (Signed)
Neurology Progress Note  HPI from initial consult note Patient is comatose and unable to provide hx. Per ICU H&P: "This is 80 year old male with a history of CKD 4, chronic venous insufficiency, DVT on Xarelto, peripheral artery disease, CHF seen by [facility] last transthoracic echo done December 2021, morbid obesity, obstructive sleep apnea on CPAP device at home, type 2 diabetes, Lewy body dementia with parkinsonism, chronic anemia history of total knee replacement, seizure disorder came in status post cardiac arrest. Received ACLS 20 minutes with ROSC. Lost pulse again and required ACLS second time followed by ROSC required endotracheal intubation in the ER. Medical intensive care unit admission post cardiac arrest.  Evaluated patient with nephrologist together at the bedside. Family was present during my evaluation. They deny flulike illness malaise, myalgia he did previously have COVID-19 and recovered to baseline. Additional review of systems reveals decreased appetite over 48 hours. Denies diarrhea nausea vomiting or any additional symptoms. Labs show chronic anemia, CKD metabolic acidosis with high anion gap. Vitals without hypotension or shock. On exam GCS 4 T with plan to perform CT head to rule out intracerebral hemorrhage followed by [place] sun cooling protocol post cardiac arrest." CTH was unremarkable. When I evaluated patient at bedside he had rhythmic twitching of his right face and RUE as well as intermittent oral automatisms. He was   S:// Seen and examined this AM His daughter again was at bedside today. He has been off Versed since yesterday and off of fentanyl since 330 this morning. No acute events-no seizures.   O:// Current vital signs: BP (!) 112/56   Pulse 85   Temp 98.6 F (37 C) (Axillary)   Resp (!) 23   Ht 5' 10.5" (1.791 m)   Wt (!) 144.1 kg   SpO2 97%   BMI 44.94 kg/m  Vital signs in last 24 hours: Temp:  [98.6 F (37 C)] 98.6 F (37 C) (06/01  0930) Pulse Rate:  [74-90] 85 (06/01 1000) Resp:  [16-35] 23 (06/01 1000) BP: (100-128)/(50-74) 112/56 (06/01 1000) SpO2:  [94 %-99 %] 97 % (06/01 1000) FiO2 (%):  [28 %] 28 % (06/01 0815) Weight:  [144.1 kg] 144.1 kg (06/01 0355) General: Intubated HEENT: Normocephalic/atraumatic CVs: Regular rate rhythm Respiratory: Vented Neurologic exam He is intubated, sedation with fentanyl was held since 8:30 AM. No spontaneous movement To noxious stimulation, attempts to open eyes. Cranial nerves: Pupils are pinpoint and sluggishly reactive, corneals are present, breathing over the ventilator, facial symmetry difficult to ascertain. Motor exam: To noxious stimulation, times open eyes and bare minimum attempt to localization if any today. Sensory exam: As above   Medications  Current Facility-Administered Medications:  .  0.9 %  sodium chloride infusion, , Intravenous, Continuous, Rust-Chester, Britton L, NP, Last Rate: 10 mL/hr at 11/20/20 1825, New Bag at 11/20/20 1825 .  bacitracin ointment, , Topical, BID, Rust-Chester, Huel Cote, NP, Given at 11/20/20 2328 .  chlorhexidine gluconate (MEDLINE KIT) (PERIDEX) 0.12 % solution 15 mL, 15 mL, Mouth Rinse, BID, Lanney Gins, Fuad, MD, 15 mL at 11/21/20 0928 .  Chlorhexidine Gluconate Cloth 2 % PADS 6 each, 6 each, Topical, Q0600, Ottie Glazier, MD, 6 each at 11/20/20 1050 .  dextrose 5 % solution, , Intravenous, Continuous, Kolluru, Sarath, MD, Last Rate: 50 mL/hr at 11/21/20 0926, New Bag at 11/21/20 0926 .  docusate sodium (COLACE) capsule 100 mg, 100 mg, Oral, BID PRN, Aleskerov, Fuad, MD .  feeding supplement (PROSource TF) liquid 45 mL, 45 mL, Per Tube, BID,  Flora Lipps, MD .  feeding supplement (VITAL HIGH PROTEIN) liquid 1,000 mL, 1,000 mL, Per Tube, Q24H, Kasa, Kurian, MD .  fentaNYL (SUBLIMAZE) bolus via infusion 50-100 mcg, 50-100 mcg, Intravenous, Q2H PRN, Rust-Chester, Britton L, NP, 50 mcg at 11/19/20 2211 .  fentaNYL 2581mg in NS  2536m(1080mml) infusion-PREMIX, 100-300 mcg/hr, Intravenous, Continuous, Rust-Chester, Britton L, NP, Last Rate: 12.5 mL/hr at 11/20/20 2202, 125 mcg/hr at 11/20/20 2202 .  free water 30 mL, 30 mL, Per Tube, Q4H, KasFlora LippsD .  [COMPLETED] levETIRAcetam (KEPPRA) IVPB 1000 mg/100 mL premix, 1,000 mg, Intravenous, Once, Stopped at 10/21/2020 1848 **FOLLOWED BY** levETIRAcetam (KEPPRA) IVPB 500 mg/100 mL premix, 500 mg, Intravenous, Q12H, StaDerek JackD, Last Rate: 400 mL/hr at 11/21/20 0408, 500 mg at 11/21/20 0408 .  MEDLINE mouth rinse, 15 mL, Mouth Rinse, 10 times per day, AleOttie GlazierD, 15 mL at 11/21/20 0928 .  midazolam (VERSED) 50 mg in dextrose 5 % 50 mL (1 mg/mL) infusion, 0.5-10 mg/hr, Intravenous, Continuous, Aleskerov, Fuad, MD, Last Rate: 2 mL/hr at 11/21/20 0035, 2 mg/hr at 11/21/20 0035 .  midazolam (VERSED) bolus via infusion 2-4 mg, 2-4 mg, Intravenous, Q1H PRN, Rust-Chester, Britton L, NP .  norepinephrine (LEVOPHED) 4mg20m 250mL61mmix infusion, 0-50 mcg/min, Intravenous, Titrated, Rust-Chester, Britton L, NP, Last Rate: 7.5 mL/hr at 11/20/20 1448, 2 mcg/min at 11/20/20 1448 .  pantoprazole (PROTONIX) injection 40 mg, 40 mg, Intravenous, QHS, Rust-Chester, Britton L, NP, 40 mg at 11/20/20 2201 .  polyethylene glycol (MIRALAX / GLYCOLAX) packet 17 g, 17 g, Per Tube, Daily PRN, AleskOttie GlazierLabs CBC    Component Value Date/Time   WBC 10.0 11/21/2020 0934   RBC 2.54 (L) 11/21/2020 0934   HGB 7.0 (L) 11/21/2020 0934   HGB 12.6 (L) 10/23/2012 0519   HCT 20.9 (L) 11/21/2020 0934   HCT 39.4 (L) 10/23/2012 0519   PLT 78 (L) 11/21/2020 0934   PLT 174 10/23/2012 0519   MCV 82.3 11/21/2020 0934   MCV 72 (L) 10/23/2012 0519   MCH 27.6 11/21/2020 0934   MCHC 33.5 11/21/2020 0934   RDW 22.7 (H) 11/21/2020 0934   RDW 18.1 (H) 10/23/2012 0519   LYMPHSABS 0.9 11/21/2020 0934   LYMPHSABS 1.6 10/23/2012 0519   MONOABS 0.7 11/21/2020 0934   MONOABS 0.5 10/23/2012  0519   EOSABS 0.1 11/21/2020 0934   EOSABS 0.1 10/23/2012 0519   BASOSABS 0.0 11/21/2020 0934   BASOSABS 0.0 10/23/2012 0519    CMP     Component Value Date/Time   NA 149 (H) 11/21/2020 0346   NA 140 10/23/2012 0519   K 4.0 11/21/2020 0346   K 4.4 10/23/2012 0519   CL 119 (H) 11/21/2020 0346   CL 109 (H) 10/23/2012 0519   CO2 22 11/21/2020 0346   CO2 24 10/23/2012 0519   GLUCOSE 77 11/21/2020 0346   GLUCOSE 144 (H) 10/23/2012 0519   BUN 92 (H) 11/21/2020 0346   BUN 26 (H) 10/23/2012 0519   CREATININE 3.88 (H) 11/21/2020 0346   CREATININE 1.73 (H) 10/23/2012 0519   CALCIUM 8.3 (L) 11/21/2020 0346   CALCIUM 9.6 10/23/2012 0519   PROT 5.6 (L) 10/25/2020 1207   PROT 7.9 09/02/2011 1539   ALBUMIN 2.8 (L) 10/24/2020 1207   ALBUMIN 3.7 09/02/2011 1539   AST 179 (H) 10/31/2020 1207   AST 46 (H) 09/02/2011 1539   ALT 159 (H) 11/05/2020 1207   ALT 53 09/02/2011 1539   ALKPHOS 108 11/20/2020  5993   TTSVXBL 39 09/02/2011 1539   BILITOT 0.5 11/03/2020 1207   BILITOT 0.4 09/02/2011 1539   GFRNONAA 15 (L) 11/21/2020 0346   GFRNONAA 39 (L) 10/23/2012 0519   GFRAA 36 (L) 12/07/2019 1652   GFRAA 55 (L) 10/23/2012 0519    Imaging I have reviewed images in epic and the results pertinent to this consultation are: No new imaging  Assessment:  80 year old man history of CKD, chronic venous insufficiency, DVT on Xarelto, peripheral arterial disease, CHF, morbid obesity, sleep apnea on CPAP, type 2 diabetes, Lewy body dementia with parkinsonism, chronic anemia with history of total knee replacement came in status post cardiac arrest.  Also had critically low hemoglobin with 2.5 without a clear source of bleeding requiring multiple transfusions. On initial neurological examination had focal twitching of the right face and arm with concern for focal seizure activity emanating from the right hemisphere.  Also had some orofacial automatisms. Seizure history dates back to many years ago-has been  stable on Keppra. He was on TTM and has been rewarmed now since 9 PM on 11/19/2020. Examination with eye-opening to noxious stimulation in questionable attempts to localization in spite of reduced and discontinued sedation. Suspect some component of hypoxic/anoxic brain damage.   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  History of Lewy body dementia with significant cognitive decline over the past few months to years  Recommendations:  Continue supportive care per primary team as you are  MRI brain without contrast  Continue home Keppra  Minimize sedation and do wake-up assessments as much as possible  Continue goals of care conversations with family-appreciate PCCM and palliative medicine team leading the conversations.  Given poor brain reserve at baseline, ongoing cognitive decline due to the neurodegenerative process of Lewy body dementia and parkinsonism, cardiac arrest along with multiorgan dysfunction, every day that he does not show meaningful exam, decreases his chances of any neurological meaningful recovery.  I will examine him again tomorrow which would be well after 72 hours from rewarming and if he still does not have a very meaningful examination, chances of neurologically meaningful recovery would definitely be grim.  I have relayed this in detail to the daughter at the bedside and 2 other siblings over the phone.  Discussed my plan with Dr. Mortimer Fries from the critical care team and Asencion Gowda, NP from the palliative medicine team  -- Amie Portland, MD Neurologist [facility] Pager: [phone]   [place] Performed by: Amie Portland, MD Total  critical care time: 31 minutes Critical care time was exclusive of separately billable procedures and treating other patients and/or supervising APPs/Residents/Students Critical care was necessary to treat or prevent imminent or life-threatening deterioration due to post cardiac hypoxic/anoxic brain injury, seizures, severe symptomatic anemia with concern for stroke This patient is critically ill and at significant risk for neurological worsening and/or death and care requires constant monitoring. Critical care was time spent personally by me on the following activities: development of treatment plan with patient and/or surrogate as well as nursing, discussions with consultants, evaluation of patient's response to treatment, examination of patient, obtaining history from patient or surrogate, ordering and performing treatments and interventions, ordering and review of laboratory studies, ordering and review of radiographic studies, pulse  oximetry, re-evaluation of patient's condition, participation in multidisciplinary rounds and medical decision making of high complexity in the care of this patient.

## 2020-11-21 NOTE — Progress Notes (Signed)
Daily Progress Note   Patient Name: Justin Shannon.       Date: 11/21/2020 DOB: 12-07-1940  Age: 80 y.o. MRN#: [id number] Attending Physician: Flora Lipps, MD Primary Care Physician: [name], DO Admit Date: 11/11/2020  Reason for Consultation/Follow-up: Establishing goals of care  Subjective: Patient is resting in bed with his daughter at bedside.  She states they are doing a wake-up assessment, and patient remains with eyes closed and no movement.  She states she is concerned with the results.  She tells me that things are being explained.  Questions answered.  Neurology into conversation to assess patient, and talk to daughter regarding baseline mental status and prognosis.  Daughter got a family member on the phone to see if patient would respond, and patient did move his head and open his eyes when that person was on the phone.  I again discussed acceptable quality of life and multiple scenarios.  Concern for overall prognosis.  Length of Stay: 3  Current Medications: Scheduled Meds:  . bacitracin   Topical BID  . chlorhexidine gluconate (MEDLINE KIT)  15 mL Mouth Rinse BID  . Chlorhexidine Gluconate Cloth  6 each Topical Q0600  . feeding supplement (PROSource TF)  45 mL Per Tube BID  . feeding supplement (VITAL HIGH PROTEIN)  1,000 mL Per Tube Q24H  . free water  30 mL Per Tube Q4H  . mouth rinse  15 mL Mouth Rinse 10 times per day  . pantoprazole (PROTONIX) IV  40 mg Intravenous QHS    Continuous Infusions: . sodium chloride 10 mL/hr at 11/20/20 1825  . dextrose 50 mL/hr at 11/21/20 0926  . fentaNYL infusion INTRAVENOUS 125 mcg/hr (11/20/20 2202)  . levETIRAcetam 500 mg (11/21/20 0408)  . midazolam 2 mg/hr (11/21/20 0035)  . norepinephrine (LEVOPHED) Adult  infusion 2 mcg/min (11/20/20 1448)    PRN Meds: docusate sodium, fentaNYL, midazolam, polyethylene glycol  Physical Exam Constitutional:      Comments: Opens eyes briefly.  On ventilator.             Vital Signs: BP (!) 125/59   Pulse 86   Temp 98.6 F (37 C) (Axillary)   Resp (!) 23   Ht 5' 10.5" (1.791 m)   Wt (!) 144.1 kg   SpO2 97%   BMI 44.94  kg/m  SpO2: SpO2: 97 % O2 Device: O2 Device: Ventilator O2 Flow Rate:    Intake/output summary:   Intake/Output Summary (Last 24 hours) at 11/21/2020 1219 Last data filed at 11/21/2020 0600 Gross per 24 hour  Intake 594.46 ml  Output 1350 ml  Net -755.54 ml   LBM: Last BM Date: 11/15/2020 Baseline Weight: Weight: (!) 142.8 kg Most recent weight: Weight: (!) 144.1 kg        Patient Active Problem List   Diagnosis Date Noted  . Severe anemia 11/19/2020  . Cardiac arrest ([place]) 11/04/2020  . Parkinsonism ([place]) 08/23/2020  . Heart failure with preserved ejection fraction, borderline, class II ([place]) 08/23/2020  . Slurred speech 06/04/2020  . CKD (chronic kidney disease) stage 3, GFR 30-59 ml/min ([facility]) 06/04/2020  . Essential hypertension 06/04/2020  . Hyperlipidemia 06/04/2020  . Lewy body dementia without behavioral disturbance ([place]) 05/21/2020  . Hyperlipidemia associated with type 2 diabetes mellitus ([place] 05/21/2020  . Status post total right knee replacement 01/22/2020  . Anemia of chronic kidney failure, stage 3 (moderate) ([place]) 01/16/2020  . Total knee replacement status 11/02/2019  . History of total knee arthroplasty 11/02/2019  . PAD (peripheral artery disease) ([place]) 10/13/2019  . Benign prostatic hyperplasia   . Seizures ([place])   . Anemia due to stage 4 chronic kidney disease ([place]) 06/24/2019  . Generalized weakness 06/24/2019  . Anemia in chronic kidney disease ([facility]) 06/24/2019  . Asthenia 06/24/2019  . Primary osteoarthritis of right knee 04/03/2019  . Use of cane as ambulatory aid 04/27/2018  . Dependence  on other enabling machines and devices 04/27/2018  . Type 2 diabetes mellitus with stage 4 chronic kidney disease, without long-term current use of insulin ([place]) 06/11/2017  . Lymphedema 05/04/2016  . Chronic venous insufficiency 05/04/2016  . Toe sprain 01/24/2016  . Morbid obesity with body mass index of 40.0-44.9 in adult [facility]) 05/24/2014  . Body mass index (BMI)40.0-44.9, adult 05/24/2014  . OSA on CPAP 01/10/2014  . CKD (chronic kidney disease), stage IV ([place]) 11/22/2013  . Allergic rhinitis 11/06/2013  . Benign hypertension with CKD (chronic kidney disease) stage IV ([place]) 11/06/2013  . Epilepsy ([place]) 11/06/2013  . Gout 11/06/2013  . History of DVT (deep vein thrombosis) 11/06/2013  . Chronic deep vein thrombosis (DVT) of proximal vein of lower extremity ([place]) 11/06/2013  . 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 Personal history of other venous thrombosis and embolism 11/06/2013    Palliative Care Assessment & Plan    Recommendations/Plan:  Palliative medicine will continue to follow for goals of care.   Code Status:    Code Status Orders  (From admission, onward)         Start     Ordered   11/20/20 1327  Limited resuscitation (code)  Continuous       Question Answer Comment  In the event of cardiac or respiratory ARREST: Initiate Code Blue, Call Rapid Response No   In the event of cardiac or respiratory ARREST: Perform CPR No   In the event of cardiac or respiratory ARREST: Perform Intubation/Mechanical Ventilation Yes   In the event of cardiac or respiratory ARREST: Use NIPPV/BiPAp only if indicated Yes   In the event of cardiac or respiratory ARREST: Administer ACLS medications if indicated No   In the event of cardiac or respiratory ARREST: Perform Defibrillation or Cardioversion if indicated No   Comments Continue current intubation. MOST form on  chart.      11/20/20 1327         Code Status History    Date Active Date Inactive Code Status Order ID Comments User Context   11/07/2020 1958 11/20/2020 1327 Full Code [id number]  Rust-[place], Huel Cote, NP Inpatient   11/01/2020 [place] 10/29/2020 1958 Full Code [id number]  Ottie Glazier, MD ED   07/21/2020 2332 07/22/2020 2033 Partial Code [id number]  Orene Desanctis, DO ED   07/21/2020 2251 07/21/2020 2332 Full Code [id number]  Orene Desanctis, DO ED   06/04/2020 1923 06/06/2020 2324 Full Code [id number]  Lenore Cordia, MD ED   12/05/2019 1809 12/08/2019 2341 Full Code [id number]  Dereck Leep, MD Inpatient   11/02/2019 1726 11/07/2019 2259 Full Code [id number]  Dereck Leep, MD Inpatient   06/24/2019 2136 07/01/2019 1743 Full Code [id number]  Orene Desanctis, DO ED   Advance Care Planning Activity       Prognosis:  Very poor    Care plan was discussed with CCM and neurology  Thank you for allowing the Palliative Medicine Team to assist in the care of this patient.   Time In: 10:15 Time Out: 11:20 Total Time 65 min Prolonged Time Billed  yes      Greater than 50%  of this time was spent counseling and coordinating care related to the above assessment and plan.  Asencion Gowda, NP  Please contact Palliative Medicine Team phone at [phone] for questions and concerns.

## 2020-11-21 NOTE — Progress Notes (Signed)
GOALS OF CARE DISCUSSION  The Clinical status was relayed to family in detail. Daughter At bedside Updated and notified of patients medical condition.    Patient remains unresponsive and will not open eyes to command.   Patient is having a weak cough and struggling to remove secretions.   Patient with increased WOB and using accessory muscles to breathe Explained to family course of therapy and the modalities    Patient with Progressive multiorgan failure with a very high probablity of a very minimal chance of meaningful recovery despite all aggressive and optimal medical therapy.    Family understands the situation. Patient with multiorgan failure Prognosis seems very poor NEURO FOLLOWING along Palliative care team following  Family are satisfied with Plan of action and management. All questions answered  Additional CC time 35 mins   Anny Sayler [name], M.D.  [name]  Medical Director [place] Director [facility] Cardio-Pulmonary Department

## 2020-11-21 NOTE — Progress Notes (Signed)
Pt was transported to MRI and back from CCU.

## 2020-11-21 NOTE — Plan of Care (Signed)
?  Problem: Clinical Measurements: ?Goal: Will remain free from infection ?Outcome: Progressing ?  ?

## 2020-11-21 NOTE — Progress Notes (Signed)
Initial Nutrition Assessment  DOCUMENTATION CODES:  Morbid obesity  INTERVENTION:  Initiate tube feeding via OGT:  Vital High Protein start at 22mL/h and advance by 52mL q6h to goal of 65 ml/h (1560 ml per day) to monitor for refeeding.  Prosource TF 45 ml BID (provides 40kcal and 11g of protein per packet)  Free water: 54mL flush q4h  Regimen provides 1640 kcal, 159 gm protein, 1484 ml free water daily  NUTRITION DIAGNOSIS:  Inadequate oral intake related to inability to eat as evidenced by NPO status.  GOAL:  Provide needs based on ASPEN/SCCM guidelines  MONITOR:  TF tolerance,I & O's,Labs,Vent status,Weight trends  REASON FOR ASSESSMENT:  Ventilator    ASSESSMENT:  Pt presented to ED after suffering PEAx2 with CPR and compressions administered x2. Intubated upon arrival to ED. Lives at home with family who reports a decline at home and increased SOB recently. PMH relevant for CHF, CKD3, Hx COVID19 (06/2019), dementia, HTN, gout, parkinson's dx, HLD, DM type 2, PAD, lymphedema.     Pt meeting with palliative care at the time of assessment. Unable to talk with family at this time. Discussed in IDT rounds, ok to feed per MD. Will enter recommendations above.    +OGT (gastric) placement confirmed with XR  Patient is currently intubated on ventilator support MV: 7.9 L/min Temp (24hrs), Avg:98.6 F (37 C), Min:98.6 F (37 C), Max:98.6 F (37 C)   Intake/Output Summary (Last 24 hours) at 11/21/2020 1116 Last data filed at 11/21/2020 0600 Gross per 24 hour  Intake 594.46 ml  Output 1700 ml  Net -1105.54 ml  Net IO Since Admission: -182.59 mL [11/21/20 1116]  Nutritionally Relevant Medications Scheduled Meds: . pantoprazole (PROTONIX) IV  40 mg Intravenous QHS   Continuous Infusions: . sodium chloride 10 mL/hr at 11/20/20 1825  . dextrose 50 mL/hr at 11/21/20 0926  . fentaNYL infusion INTRAVENOUS 125 mcg/hr (11/20/20 2202)  . levETIRAcetam 500 mg (11/21/20 0408)  .  norepinephrine (LEVOPHED) Adult infusion 2 mcg/min (11/20/20 1448)   PRN Meds:.docusate sodium, polyethylene glycol  Labs reviewed:  Na 149, Chloride 119  BUN 92, creatinine 3.88  SBG ranges from 69-123 mg/dL over the last 24 hours  HgbA1c 5.5% (3/2)  NUTRITION - FOCUSED PHYSICAL EXAM: Defer, family meeting with palliative care in room.   Diet Order:   Diet Order            Diet NPO time specified  Diet effective now                 EDUCATION NEEDS:  No education needs have been identified at this time  Skin:  Skin Assessment: Reviewed RN Assessment  Last BM:  5/29 per RN documentation  Height:  Ht Readings from Last 1 Encounters:  10/26/2020 5' 10.5" (1.791 m)    Weight:  Wt Readings from Last 1 Encounters:  11/21/20 (!) 144.1 kg    Ideal Body Weight:  78.2 kg  BMI:  Body mass index is 44.94 kg/m.  Estimated Nutritional Needs:   Kcal:  1500-2000 kcal (ASPEN guidelines for the obese ventilated pt (11-14 kcal/kg))  Protein:  155-195 g/d (2-2.5 g/kg)  Fluid:  1.5 L/d   Ranell Patrick, RD, LDN Clinical Dietitian Pager on [place]

## 2020-11-21 NOTE — Progress Notes (Signed)
[place] for Electrolyte Monitoring and Replacement   Recent Labs: Potassium (mmol/L)  Date Value  11/21/2020 4.0  10/23/2012 4.4   Magnesium (mg/dL)  Date Value  11/20/2020 1.9  09/02/2011 1.7 (L)   Calcium (mg/dL)  Date Value  11/21/2020 8.3 (L)   Calcium, Total (mg/dL)  Date Value  10/23/2012 9.6   Albumin (g/dL)  Date Value  10/29/2020 2.8 (L)  09/02/2011 3.7   Phosphorus (mg/dL)  Date Value  11/20/2020 4.6   Sodium (mmol/L)  Date Value  11/21/2020 149 (H)  10/23/2012 140    Assessment: 80 y.o. male with a hx of CKD, HFpEF, BPH, hypertension, dementia, DVT on Xarelto admitted on 10/22/2020 s/p cardiac arrest. Received ACLS 20 minutes with ROSC. Lost pulse again and required ACLS second time followed by ROSC. Patient required intubation in the ER. Patient undergoing TTM, with normothermia achieved 5/30 at 2114. Neurology is following the patient. Pharmacy has been asked to follow electrolytes and replace as needed. He has noted renal function impairment.  Goal of Therapy:  Potassium 4.0 - 5.1 mmol/L Magnesium 2.0 - 2.4 mg/dL All Other Electrolytes WNL  Plan:   Milly Jakob has been discontinued  Continue to follow along with CCU medical team to optimize electrolytes  Tawnya Crook ,PharmD Clinical Pharmacist 11/21/2020 1:20 PM

## 2020-11-21 NOTE — Progress Notes (Signed)
[facility]  ROUNDING NOTE   Subjective:   UOP 2127m (29370m  Creatinine 3.88 (3.83) (3.75)  Na 149. Started on D5NS infusion  Daughter at bedside.   Objective:  Vital signs in last 24 hours:  Temp:  [98.6 F (37 C)] 98.6 F (37 C) (06/01 0930) Pulse Rate:  [74-90] 86 (06/01 1100) Resp:  [16-35] 23 (06/01 1100) BP: (100-128)/(50-74) 125/59 (06/01 1100) SpO2:  [94 %-99 %] 97 % (06/01 1100) FiO2 (%):  [28 %] 28 % (06/01 0815) Weight:  [144.1 kg] 144.1 kg (06/01 0355)  Weight change: 2.4 kg Filed Weights   11/01/2020 1414 11/20/20 0444 11/21/20 0355  Weight: (!) 142.8 kg (!) 141.7 kg (!) 144.1 kg    Intake/Output: I/O last 3 completed shifts: In: 1298.2 [I.V.:998.2; IV Piggyback:300] Out: 4000 [Urine:4000]   Intake/Output this shift:  No intake/output data recorded.  Physical Exam: General: Critically ill  Head: ETT   Eyes: Eyes closed  Neck: trachea midline  Lungs:  PRVC FiO2 28%  Heart: Regular rate and rhythm  Abdomen:  Soft, nontender  Extremities: no peripheral edema.  Neurologic: Intubated, sedated  Skin: No lesions        Basic Metabolic Panel: Recent Labs  Lab 10/29/2020 1207 11/13/2020 2032 11/19/20 0459 11/19/20 1647 11/20/20 0428 11/21/20 0346  NA 144 144 145 145 147* 149*  K 4.8 5.6* 5.2* 4.5 4.1 4.0  CL 111 112* 115* 112* 116* 119*  CO2 19* 22 22 24 22 22   GLUCOSE 175* 144* 123* 100* 86 77  BUN 99* 106* 112* 105* 98* 92*  CREATININE 3.18* 3.42* 3.60* 3.75* 3.83* 3.88*  CALCIUM 9.0 8.3* 8.0* 8.1* 8.1* 8.3*  MG 2.6* 2.2 2.4  --  1.9  --   PHOS  --  5.4* 5.9*  --  4.6  --     Liver Function Tests: Recent Labs  Lab 11/01/2020 1207  AST 179*  ALT 159*  ALKPHOS 108  BILITOT 0.5  PROT 5.6*  ALBUMIN 2.8*   No results for input(s): LIPASE, AMYLASE in the last 168 hours. No results for input(s): AMMONIA in the last 168 hours.  CBC: Recent Labs  Lab 11/02/2020 1241 11/20/2020 1336 10/30/2020 1812 11/19/20 0459  11/19/20 1647 11/20/20 0428 11/20/20 1105 11/20/20 1524 11/20/20 2207 11/21/20 0315 11/21/20 0934  WBC 12.7* 13.5*  --  11.4*  --  11.0*  --   --   --   --  10.0  NEUTROABS  --  12.0*  --   --   --   --   --   --   --   --  8.3*  HGB 2.5* 2.5*   < > 6.5*   < > 7.3* 7.4* 7.0* 7.1* 7.0* 7.0*  HCT 8.0* 7.9*   < > 18.9*  --  21.9* 21.8* 21.1* 21.4* 21.6* 20.9*  MCV 67.2* 68.1*  --  80.1  --  80.5  --   --   --   --  82.3  PLT 153 151  --  108*  --  92*  --   --   --   --  78*   < > = values in this interval not displayed.    Cardiac Enzymes: No results for input(s): CKTOTAL, CKMB, CKMBINDEX, TROPONINI in the last 168 hours.  BNP: Invalid input(s): POCBNP  CBG: Recent Labs  Lab 11/20/20 2352 11/21/20 0307 11/21/20 0556 11/21/20 0726 11/21/20 1151  GLUCAP 123* 68* 85 99 105*    Microbiology:  Results for orders placed or performed during the hospital encounter of 11/16/2020  Resp Panel by RT-PCR (Flu A&B, Covid) Nasopharyngeal Swab     Status: None   Collection Time: 10/26/2020 12:07 PM   Specimen: Nasopharyngeal Swab; Nasopharyngeal(NP) swabs in vial transport medium  Result Value Ref Range Status   SARS Coronavirus 2 by RT PCR NEGATIVE NEGATIVE Final    Comment: (NOTE) SARS-CoV-2 target nucleic acids are NOT DETECTED.  The SARS-CoV-2 RNA is generally detectable in upper respiratory specimens during the acute phase of infection. The lowest concentration of SARS-CoV-2 viral copies this assay can detect is 138 copies/mL. A negative result does not preclude SARS-Cov-2 infection and should not be used as the sole basis for treatment or other patient management decisions. A negative result may occur with  improper specimen collection/handling, submission of specimen other than nasopharyngeal swab, presence of viral mutation(s) within the areas targeted by this assay, and inadequate number of viral copies(<138 copies/mL). A negative result must be combined with clinical  observations, patient history, and epidemiological information. The expected result is Negative.  Fact Sheet for Patients:  EntrepreneurPulse.com.au  Fact Sheet for Healthcare Providers:  IncredibleEmployment.be  This test is no t yet approved or cleared by the [place] FDA and  has been authorized for detection and/or diagnosis of SARS-CoV-2 by FDA under an Emergency Use Authorization (EUA). This EUA will remain  in effect (meaning this test can be used) for the duration of the COVID-19 declaration under Section 564(b)(1) of the Act, 21 U.S.C.section 360bbb-3(b)(1), unless the authorization is terminated  or revoked sooner.       Influenza A by PCR NEGATIVE NEGATIVE Final   Influenza B by PCR NEGATIVE NEGATIVE Final    Comment: (NOTE) The Xpert Xpress SARS-CoV-2/FLU/RSV plus assay is intended as an aid in the diagnosis of influenza from Nasopharyngeal swab specimens and should not be used as a sole basis for treatment. Nasal washings and aspirates are unacceptable for Xpert Xpress SARS-CoV-2/FLU/RSV testing.  Fact Sheet for Patients: EntrepreneurPulse.com.au  Fact Sheet for Healthcare Providers: IncredibleEmployment.be  This test is not yet approved or cleared by the [place] FDA and has been authorized for detection and/or diagnosis of SARS-CoV-2 by FDA under an Emergency Use Authorization (EUA). This EUA will remain in effect (meaning this test can be used) for the duration of the COVID-19 declaration under Section 564(b)(1) of the Act, 21 U.S.C. section 360bbb-3(b)(1), unless the authorization is terminated or revoked.  Performed at [facility], [address] [place], [place] [address]   CULTURE, BLOOD (ROUTINE X 2) w Reflex to ID Panel     Status: None (Preliminary result)   Collection Time: 11/01/2020 12:41 PM   Specimen: BLOOD  Result Value Ref Range Status   Specimen  Description BLOOD LEFT ASSIST CONTROL  Final   Special Requests   Final    BOTTLES DRAWN AEROBIC AND ANAEROBIC Blood Culture adequate volume   Culture   Final    NO GROWTH 3 DAYS Performed at [facility], [address], [place], [place] [address]    Report Status PENDING  Incomplete  CULTURE, BLOOD (ROUTINE X 2) w Reflex to ID Panel     Status: None (Preliminary result)   Collection Time: 11/17/2020 12:42 PM   Specimen: BLOOD  Result Value Ref Range Status   Specimen Description BLOOD RIGHT ASSIST CONTROL  Final   Special Requests   Final    BOTTLES DRAWN AEROBIC AND ANAEROBIC Blood Culture adequate volume  Culture   Final    NO GROWTH 3 DAYS Performed at [facility], [address] [place], [place] [address]    Report Status PENDING  Incomplete  Urine culture     Status: None   Collection Time: 11/17/2020  1:36 PM   Specimen: Urine, Random  Result Value Ref Range Status   Specimen Description   Final    URINE, RANDOM Performed at [facility], [address] [place], [place] [address]    Special Requests   Final    NONE Performed at [facility], [address], [place], [place] [address]    Culture   Final    NO GROWTH Performed at [facility], [place] [address] [place], [place] [address]    Report Status 11/20/2020 FINAL  Final  MRSA PCR Screening     Status: None   Collection Time: 10/21/2020  3:55 PM   Specimen: Nasal Mucosa; Nasopharyngeal  Result Value Ref Range Status   MRSA by PCR NEGATIVE NEGATIVE Final    Comment:        The GeneXpert MRSA Assay (FDA approved for NASAL specimens only), is one component of a comprehensive MRSA colonization surveillance program. It is not intended to diagnose MRSA infection nor to guide or monitor treatment for MRSA infections. Performed at [facility], [address] [place], [place] [address]   Culture, respiratory (tracheal aspirate)     Status: None    Collection Time: 11/15/2020  4:30 PM   Specimen: Tracheal Aspirate; Respiratory  Result Value Ref Range Status   Specimen Description   Final    TRACHEAL ASPIRATE Performed at [facility], [address], [place], [place] [address]    Special Requests   Final    NONE Performed at [facility], [address] [place], [place] [address]    Gram Stain   Final    MODERATE WBC PRESENT, PREDOMINANTLY PMN NO ORGANISMS SEEN    Culture   Final    Normal respiratory flora-no Staph aureus or Pseudomonas seen Performed at [facility] [address], [place], [place] [address]    Report Status 11/20/2020 FINAL  Final  Respiratory (~20 pathogens) panel by PCR     Status: None   Collection Time: 11/10/2020  4:30 PM  Result Value Ref Range Status   Adenovirus NOT DETECTED NOT DETECTED Final   Coronavirus 229E NOT DETECTED NOT DETECTED Final    Comment: (NOTE) The Coronavirus on the Respiratory Panel, DOES NOT test for the novel  Coronavirus (2019 nCoV)    Coronavirus HKU1 NOT DETECTED NOT DETECTED Final   Coronavirus NL63 NOT DETECTED NOT DETECTED Final   Coronavirus OC43 NOT DETECTED NOT DETECTED Final   Metapneumovirus NOT DETECTED NOT DETECTED Final   Rhinovirus / Enterovirus NOT DETECTED NOT DETECTED Final   Influenza A NOT DETECTED NOT DETECTED Final   Influenza B NOT DETECTED NOT DETECTED Final   Parainfluenza Virus 1 NOT DETECTED NOT DETECTED Final   Parainfluenza Virus 2 NOT DETECTED NOT DETECTED Final   Parainfluenza Virus 3 NOT DETECTED NOT DETECTED Final   Parainfluenza Virus 4 NOT DETECTED NOT DETECTED Final   Respiratory Syncytial Virus NOT DETECTED NOT DETECTED Final   Bordetella pertussis NOT DETECTED NOT DETECTED Final   Bordetella Parapertussis NOT DETECTED NOT DETECTED Final   Chlamydophila pneumoniae NOT DETECTED NOT DETECTED Final   Mycoplasma pneumoniae NOT DETECTED NOT DETECTED Final    Comment: Performed at  [facility], [place]  [address], [place], [place] [address]    Coagulation Studies: Recent Labs    11/17/2020 2030/09/01  LABPROT 26.1*  INR 2.4*    Urinalysis: No results for input(s): COLORURINE, LABSPEC, PHURINE, GLUCOSEU, HGBUR, BILIRUBINUR, KETONESUR, PROTEINUR, UROBILINOGEN, NITRITE, LEUKOCYTESUR in the last 72 hours.  Invalid input(s): APPERANCEUR    Imaging: EEG adult  Result Date: 11/19/2020 Lora Havens, MD     11/19/2020  3:21 PM Patient Name: Justin Shannon. MRN: [id number] Epilepsy Attending: Lora Havens Referring Physician/Provider: Olga Millers, NP Date: 11/19/2020 Duration: 22.29 mins Patient history: 80yo M with witnessed focal seizure activity post cardiac arrest. EEG to evaluate for seizure Level of alertness: comatose AEDs during EEG study: LEV, versed Technical aspects: This EEG study was done with scalp electrodes positioned according to the 10-20 International system of electrode placement. Electrical activity was acquired at a sampling rate of 500Hz  and reviewed with a high frequency filter of 70Hz  and a low frequency filter of 1Hz . EEG data were recorded continuously and digitally stored. Description: EEG showed near continuous generalized 3 to 6 Hz theta-delta slowing with brief periods of generalized attenuation lasting 1-3 seconds. Hyperventilation and photic stimulation were not performed.   ABNORMALITY - Continuous slow, generalized IMPRESSION: This study is suggestive of severe diffuse encephalopathy, nonspecific etiology. No seizures or epileptiform discharges were seen throughout the recording. Priyanka Barbra Sarks     Medications:   . sodium chloride 10 mL/hr at 11/20/20 1825  . dextrose 50 mL/hr at 11/21/20 0926  . fentaNYL infusion INTRAVENOUS 125 mcg/hr (11/20/20 2200/09/01)  . levETIRAcetam 500 mg (11/21/20 0408)  . midazolam 2 mg/hr (11/21/20 0035)  . norepinephrine (LEVOPHED) Adult infusion 2 mcg/min (11/20/20 1448)   . bacitracin   Topical BID  . chlorhexidine gluconate  (MEDLINE KIT)  15 mL Mouth Rinse BID  . Chlorhexidine Gluconate Cloth  6 each Topical Q0600  . feeding supplement (PROSource TF)  45 mL Per Tube BID  . feeding supplement (VITAL HIGH PROTEIN)  1,000 mL Per Tube Q24H  . free water  30 mL Per Tube Q4H  . mouth rinse  15 mL Mouth Rinse 10 times per day  . pantoprazole (PROTONIX) IV  40 mg Intravenous QHS   docusate sodium, fentaNYL, midazolam, polyethylene glycol  Assessment/ Plan:  Justin Shannon. is a 80 y.o. black male with diabetes mellitus type II, peripheral vascular disease, diabetic lower extremity ulcers, hypertension, congestive heart failure, BPH, DVT, lewy body dementia, sleep apnea, seizures disorder, gout, Parkinson's, who is admitted to [facility] on 10/22/2020 on Cardiac arrest (Violet) [I46.9] Abnormal chest x-ray [R93.89]  1. Acute kidney injury on chronic kidney disease stage IIIB: baseline creatinine 2.19, GFR of 30 on 10/21/2020. With proteinuria.  Hyperkalemia has resolved.  No IV contrast exposure No indication for dialysis. Nonoliuguric urine output.  - Discontinued lokelma - Monitor for diuretic need.  Kidney function seems to have reached a plateau. Monitor for renal recovery.   2. Acute anemia on kidney failure: status post PRBC transfusions. Hemoglobin 7. Appreciate hematology input.   3. Hypotension: requiring vasopressor: Continue norepinephrine  4. Hypernatremia: 149. With free water deficit.  - Change IV fluids to D5W infusion.    LOS: 3 Kyrollos Cordell 6/1/202211:56 AM

## 2020-11-21 NOTE — Progress Notes (Signed)
NAME:  Justin Schnapp., MRN:  [id number], DOB:  [birth year], LOS: 3 ADMISSION DATE:  11/13/2020  80 year old male with a history of CKD 4, chronic venous insufficiency, DVT on Xarelto, peripheral artery disease, CHF seen by [facility] last transthoracic echo done December 2021, morbid obesity, obstructive sleep apnea on CPAP device at home, type 2 diabetes, Lewy body dementia with parkinsonism, chronic anemia history of total knee replacement, seizure disorder came in status post cardiac arrest.  Received ACLS 20 minutes with ROSC.  Lost pulse again and required ACLS second time followed by ROSC required endotracheal intubation in the ER.  Medical intensive care unit admission post cardiac arrest.    Evaluated patient with nephrologist together at the bedside.   They deny flulike illness malaise, myalgia he did previously have COVID-19 and recovered to baseline.  Additional review of systems reveals decreased appetite over 48 hours.  Denies diarrhea nausea vomiting or any additional symptoms.  Labs show chronic anemia, CKD metabolic acidosis with high anion gap.  Vitals without hypotension or shock.  On exam GCS 4 T with plan to perform CT head to rule out intracerebral hemorrhage followed by [facility] cooling protocol post cardiac arrest.     Antibiotics Given (last 72 hours)    Date/Time Action Medication Dose Rate   11/01/2020 1255 New Bag/Given   piperacillin-tazobactam (ZOSYN) IVPB 3.375 g 3.375 g 100 mL/hr   11/12/2020 1631 New Bag/Given   vancomycin (VANCOREADY) IVPB 2000 mg/400 mL 2,000 mg 200 mL/hr   10/23/2020 2232 New Bag/Given   piperacillin-tazobactam (ZOSYN) IVPB 3.375 g 3.375 g 12.5 mL/hr   11/19/20 0509 New Bag/Given   piperacillin-tazobactam (ZOSYN) IVPB 3.375 g 3.375 g 12.5 mL/hr      Significant Hospital Events: Including procedures, antibiotic start and stop dates in addition to other pertinent events   11/19/20- patient finishing [facility] protocol. He received several bags  of PRBC. Heme/onc  workup is in process. Met with family and reviewed care plan.  11/20/20- patient is on MV post ACLS now rewarmed post TTM with sedation he is reacting to physical stimuli.  6/1 remains with mulitorgan failure   Interim History / Subjective:  Remains on vent Severe multiorgan failure        Objective   Blood pressure (!) 111/58, pulse 82, temperature 98.6 F (37 C), temperature source Bladder, resp. rate (!) 23, height 5' 10.5" (1.791 m), weight (!) 144.1 kg, SpO2 97 %.    Vent Mode: PRVC FiO2 (%):  [28 %] 28 % Set Rate:  [16 bmp] 16 bmp Vt Set:  [500 mL] 500 mL PEEP:  [5 cmH20] 5 cmH20 Plateau Pressure:  [0 cmH20] 0 cmH20   Intake/Output Summary (Last 24 hours) at 11/21/2020 0748 Last data filed at 11/21/2020 0600 Gross per 24 hour  Intake 594.46 ml  Output 2150 ml  Net -1555.54 ml   Filed Weights   11/16/2020 1414 11/20/20 0444 11/21/20 0355  Weight: (!) 142.8 kg (!) 141.7 kg (!) 144.1 kg      REVIEW OF SYSTEMS  PATIENT IS UNABLE TO PROVIDE COMPLETE REVIEW OF SYSTEMS DUE TO SEVERE CRITICAL ILLNESS AND TOXIC METABOLIC ENCEPHALOPATHY   PHYSICAL EXAMINATION:  GENERAL:critically ill appearing, +resp distress HEAD: Normocephalic, atraumatic.  EYES: Pupils equal, round, reactive to light.  No scleral icterus.  MOUTH: Moist mucosal membrane. NECK: Supple. PULMONARY: +rhonchi, +wheezing CARDIOVASCULAR: S1 and S2. Regular rate and rhythm. No murmurs, rubs, or gallops.  GASTROINTESTINAL: Soft, nontender, -distended. Positive bowel sounds.  MUSCULOSKELETAL:  No swelling, clubbing, or edema.  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 s/p hypothermia  protocol   Severe ACUTE Hypoxic and Hypercapnic Respiratory Failure -continue Mechanical Ventilator support -continue Bronchodilator Therapy -Wean Fio2 and PEEP as tolerated -VAP/VENT bundle implementation -will perform SAT/SBT when respiratory parameters are met   CARDIAC FAILURE-acute diastolic dysfunction -oxygen as needed -Lasix as tolerated -follow up cardiac enzymes as indicated   CARDIAC ICU monitoring   ACUTE KIDNEY INJURY/Renal Failure -continue Foley Catheter-assess need -Avoid nephrotoxic agents -Follow urine output, BMP -Ensure adequate renal perfusion, optimize oxygenation -Renal dose medications   NEUROLOGY Acute toxic metabolic encephalopathy, need for sedation Goal RASS -2 to -3 Need to consider MRI NEURO CONSULTATION Wake up assessment pending    CARDIOGENIC SHOCK -use vasopressors to keep MAP>65 as needed -follow ABG and LA -follow up cultures -emperic ABX  INFECTIOUS DISEASE -follow up cultures   ENDO - ICU hypoglycemic\Hyperglycemia protocol -check FSBS per protocol   GI GI PROPHYLAXIS as indicated  NUTRITIONAL STATUS DIET-->TF's as tolerated Constipation protocol as indicated   ELECTROLYTES -follow labs as needed -replace as needed -pharmacy consultation and following     Best practice (right click and "Reselect all SmartList Selections" daily)  Pain/Anxiety/Delirium protocol (if indicated): Yes (RASS goal 0) VAP protocol (if indicated): Yes DVT prophylaxis: LMWH GI prophylaxis: H2B Glucose control:  SSI Yes Central venous access:  Yes, and it is still needed Arterial line:  N/A Foley:  N/A Mobility:  bed rest  PT consulted: N/A Code Status:  full code Disposition: ICU  Labs   CBC: Recent Labs  Lab 11/12/2020 1241 11/09/2020 1336 11/13/2020 1812 11/19/20 0459 11/19/20 1647 11/20/20 0428 11/20/20 1105 11/20/20 1524 11/20/20 2207 11/21/20 0315  WBC 12.7* 13.5*  --  11.4*  --  11.0*  --   --   --   --    NEUTROABS  --  12.0*  --   --   --   --   --   --   --   --   HGB 2.5* 2.5*   < > 6.5*   < > 7.3* 7.4* 7.0* 7.1* 7.0*  HCT 8.0* 7.9*   < > 18.9*  --  21.9* 21.8* 21.1* 21.4* 21.6*  MCV 67.2* 68.1*  --  80.1  --  80.5  --   --   --   --   PLT 153 151  --  108*  --  92*  --   --   --   --    < > = values in this interval not displayed.    Basic Metabolic Panel: Recent Labs  Lab 11/19/2020 1207 10/26/2020 2032 11/19/20 0459 11/19/20 1647 11/20/20 0428 11/21/20 0346  NA 144 144 145 145 147* 149*  K 4.8 5.6* 5.2* 4.5 4.1 4.0  CL 111 112* 115* 112* 116* 119*  CO2 19* _0 GLUCOSE 175* 144* 123* 100* 86 77  BUN 99* 106* 112* 105* 98* 92*  CREATININE 3.18* 3.42* 3.60* 3.75* 3.83* 3.88*  CALCIUM 9.0 8.3* 8.0* 8.1* 8.1* 8.3*  MG 2.6* 2.2 2.4  --  1.9  --   PHOS  --  5.4* 5.9*  --  4.6  --  GFR: Estimated Creatinine Clearance: 22.7 mL/min (A) (by C-G formula based on SCr of 3.88 mg/dL (H)). Recent Labs  Lab 11/09/2020 1207 10/28/2020 1241 10/25/2020 1336 10/21/2020 1600 11/19/20 0459 11/20/20 0428  WBC  --  12.7* 13.5*  --  11.4* 11.0*  LATICACIDVEN 6.6*  --   --  5.1*  --   --     Liver Function Tests: Recent Labs  Lab 11/17/2020 1207  AST 179*  ALT 159*  ALKPHOS 108  BILITOT 0.5  PROT 5.6*  ALBUMIN 2.8*   No results for input(s): LIPASE, AMYLASE in the last 168 hours. No results for input(s): AMMONIA in the last 168 hours.  ABG    Component Value Date/Time   PHART 7.38 11/19/2020 0422   PCO2ART 36 11/19/2020 0422   PO2ART 108 11/19/2020 0422   HCO3 22.0 11/19/2020 0422   ACIDBASEDEF 3.0 (H) 11/19/2020 0422   O2SAT 98.3 11/19/2020 0422     Coagulation Profile: Recent Labs  Lab 11/05/2020 2032  INR 2.4*    Cardiac Enzymes: No results for input(s): CKTOTAL, CKMB, CKMBINDEX, TROPONINI in the last 168 hours.  HbA1C: Hemoglobin A1C  Date/Time Value Ref Range Status  08/22/2020 10:12 AM 5.5 4.0 - 5.6 % Final  03/13/2020 12:00 AM 5.5  Final   Hgb A1c  MFr Bld  Date/Time Value Ref Range Status  06/05/2020 08:16 AM 5.9 (H) 4.8 - 5.6 % Final    Comment:    (NOTE) Pre diabetes:          5.7%-6.4%  Diabetes:              >6.4%  Glycemic control for   <7.0% adults with diabetes   10/28/2019 09:59 AM 6.0 (H) 4.8 - 5.6 % Final    Comment:    (NOTE) Pre diabetes:          5.7%-6.4% Diabetes:              >6.4% Glycemic control for   <7.0% adults with diabetes     CBG: Recent Labs  Lab 11/20/20 2308 11/20/20 2352 11/21/20 0307 11/21/20 0556 11/21/20 0726  GLUCAP 69* 123* 68* 85 99    Allergies Allergies  Allergen Reactions  . Shellfish Allergy Anaphylaxis and Other (See Comments)    gout  . Iodinated Diagnostic Agents Other (See Comments)    Chronic kidney disease 3b  . Nsaids Other (See Comments)    Chronic kidney disease 3b  . Rivastigmine Other (See Comments)    Side effect and not a true allergy       DVT/GI PRX  assessed I Assessed the need for Labs I Assessed the need for Foley I Assessed the need for Central Venous Line Family Discussion when available I Assessed the need for Mobilization I made an Assessment of medications to be adjusted accordingly Safety Risk assessment completed  CASE DISCUSSED IN MULTIDISCIPLINARY ROUNDS WITH ICU TEAM     Critical Care Time devoted to patient care services described in this note is 60 minutes.  Critical care was necessary to treat or prevent imminent or life-threatening deterioration. Overall, patient is critically ill, prognosis is guarded.  Patient with Multiorgan failure and at high risk for cardiac arrest and death.    Corrin Parker, M.D.  [name]  Medical Director [place] Director [facility] Cardio-Pulmonary Department

## 2020-11-21 DEATH — deceased

## 2020-11-22 DIAGNOSIS — G939 Disorder of brain, unspecified: Secondary | ICD-10-CM

## 2020-11-22 LAB — CBC WITH DIFFERENTIAL/PLATELET
Abs Immature Granulocytes: 0.09 K/uL — ABNORMAL HIGH (ref 0.00–0.07)
Basophils Absolute: 0 K/uL (ref 0.0–0.1)
Basophils Relative: 0 %
Eosinophils Absolute: 0.1 K/uL (ref 0.0–0.5)
Eosinophils Relative: 1 %
HCT: 22.7 % — ABNORMAL LOW (ref 39.0–52.0)
Hemoglobin: 7.3 g/dL — ABNORMAL LOW (ref 13.0–17.0)
Immature Granulocytes: 1 %
Lymphocytes Relative: 7 %
Lymphs Abs: 0.7 K/uL (ref 0.7–4.0)
MCH: 27.1 pg (ref 26.0–34.0)
MCHC: 32.2 g/dL (ref 30.0–36.0)
MCV: 84.4 fL (ref 80.0–100.0)
Monocytes Absolute: 0.8 K/uL (ref 0.1–1.0)
Monocytes Relative: 8 %
Neutro Abs: 8.3 K/uL — ABNORMAL HIGH (ref 1.7–7.7)
Neutrophils Relative %: 83 %
Platelets: 85 K/uL — ABNORMAL LOW (ref 150–400)
RBC: 2.69 MIL/uL — ABNORMAL LOW (ref 4.22–5.81)
RDW: 23.6 % — ABNORMAL HIGH (ref 11.5–15.5)
WBC: 10 K/uL (ref 4.0–10.5)
nRBC: 1.1 % — ABNORMAL HIGH (ref 0.0–0.2)

## 2020-11-22 LAB — BASIC METABOLIC PANEL WITH GFR
Anion gap: 9 (ref 5–15)
BUN: 91 mg/dL — ABNORMAL HIGH (ref 8–23)
CO2: 23 mmol/L (ref 22–32)
Calcium: 8.6 mg/dL — ABNORMAL LOW (ref 8.9–10.3)
Chloride: 118 mmol/L — ABNORMAL HIGH (ref 98–111)
Creatinine, Ser: 3.84 mg/dL — ABNORMAL HIGH (ref 0.61–1.24)
GFR, Estimated: 15 mL/min — ABNORMAL LOW
Glucose, Bld: 144 mg/dL — ABNORMAL HIGH (ref 70–99)
Potassium: 4.1 mmol/L (ref 3.5–5.1)
Sodium: 150 mmol/L — ABNORMAL HIGH (ref 135–145)

## 2020-11-22 LAB — GLUCOSE, CAPILLARY
Glucose-Capillary: 134 mg/dL — ABNORMAL HIGH (ref 70–99)
Glucose-Capillary: 155 mg/dL — ABNORMAL HIGH (ref 70–99)
Glucose-Capillary: 168 mg/dL — ABNORMAL HIGH (ref 70–99)
Glucose-Capillary: 149 mg/dL — ABNORMAL HIGH (ref 70–99)

## 2020-11-22 LAB — HEMOGLOBIN AND HEMATOCRIT, BLOOD
HCT: 23.1 % — ABNORMAL LOW (ref 39.0–52.0)
HCT: 23.1 % — ABNORMAL LOW (ref 39.0–52.0)
Hemoglobin: 7.2 g/dL — ABNORMAL LOW (ref 13.0–17.0)
Hemoglobin: 7.3 g/dL — ABNORMAL LOW (ref 13.0–17.0)

## 2020-11-22 LAB — PHOSPHORUS: Phosphorus: 4.6 mg/dL (ref 2.5–4.6)

## 2020-11-22 LAB — MAGNESIUM: Magnesium: 2.3 mg/dL (ref 1.7–2.4)

## 2020-11-22 LAB — FUNGITELL, SERUM: Fungitell Result: 320 pg/mL — ABNORMAL HIGH (ref <80)

## 2020-11-22 MED ORDER — MIDAZOLAM HCL 2 MG/2ML IJ SOLN: 2.0000 mg | INTRAMUSCULAR | Status: DC | PRN

## 2020-11-22 MED ORDER — ACETAMINOPHEN 325 MG PO TABS: 650.0000 mg | ORAL_TABLET | Freq: Four times a day (QID) | ORAL | Status: DC | PRN

## 2020-11-22 MED ORDER — DIPHENHYDRAMINE HCL 50 MG/ML IJ SOLN: 25.0000 mg | INTRAMUSCULAR | Status: DC | PRN

## 2020-11-22 MED ORDER — DEXTROSE 5 % IV SOLN: INTRAVENOUS | Status: DC

## 2020-11-22 MED ORDER — GLYCOPYRROLATE 0.2 MG/ML IJ SOLN: 0.2000 mg | INTRAMUSCULAR | Status: DC | PRN

## 2020-11-22 MED ORDER — MORPHINE BOLUS VIA INFUSION
5.0000 mg | INTRAVENOUS | Status: DC | PRN
  Filled 2020-11-22: qty 5

## 2020-11-22 MED ORDER — POLYVINYL ALCOHOL 1.4 % OP SOLN
1.0000 [drp] | Freq: Four times a day (QID) | OPHTHALMIC | Status: DC | PRN
  Filled 2020-11-22: qty 15

## 2020-11-22 MED ORDER — MORPHINE 100MG IN NS 100ML (1MG/ML) PREMIX INFUSION
0.0000 mg/h | INTRAVENOUS | Status: DC
  Administered 2020-11-22: 5 mg/h via INTRAVENOUS
  Filled 2020-11-22: qty 100

## 2020-11-22 MED ORDER — MORPHINE BOLUS VIA INFUSION
2.0000 mg | INTRAVENOUS | Status: DC | PRN
  Administered 2020-11-22 (×3): 2 mg via INTRAVENOUS
  Filled 2020-11-22: qty 2

## 2020-11-22 MED ORDER — ACETAMINOPHEN 650 MG RE SUPP: 650.0000 mg | Freq: Four times a day (QID) | RECTAL | Status: DC | PRN

## 2020-11-22 MED ORDER — GLYCOPYRROLATE 1 MG PO TABS
1.0000 mg | ORAL_TABLET | ORAL | Status: DC | PRN
  Filled 2020-11-22: qty 1

## 2020-11-22 MED ORDER — MORPHINE SULFATE (PF) 2 MG/ML IV SOLN: 2.0000 mg | INTRAVENOUS | Status: DC | PRN

## 2020-11-22 MED ORDER — SODIUM CHLORIDE 0.9 % IV SOLN: 200.0000 mg | INTRAVENOUS | Status: DC

## 2020-11-23 LAB — GLUCOSE 6 PHOSPHATE DEHYDROGENASE: G6PDH: UNDETERMINED U/g{Hb}

## 2020-11-23 LAB — CULTURE, BLOOD (ROUTINE X 2)
Culture: NO GROWTH
Culture: NO GROWTH
Special Requests: ADEQUATE
Special Requests: ADEQUATE

## 2020-11-23 LAB — HISTOPLASMA GALACTOMANNAN AG QN UR (REFLEXED): Histoplasma Gal'mannan Ag Qn U: 1.7 ng/mL

## 2020-11-27 ENCOUNTER — Encounter: Payer: Medicare Other | Admitting: Urology

## 2020-11-27 LAB — ACID FAST SMEAR (AFB, MYCOBACTERIA): Acid Fast Smear: NEGATIVE

## 2020-11-28 ENCOUNTER — Ambulatory Visit: Admitting: Dermatology

## 2020-12-06 ENCOUNTER — Telehealth: Payer: Self-pay

## 2020-12-21 NOTE — Progress Notes (Signed)
GOALS OF CARE DISCUSSION  The Clinical status was relayed to family in detail. All family members  Updated and notified of patients medical condition.    Patient remains unresponsive and will not open eyes to command.   Patient is having a weak cough and struggling to remove secretions.   Patient with increased WOB and using accessory muscles to breathe Explained to family course of therapy and the modalities    Patient with Progressive multiorgan failure with a very high probablity of a very minimal chance of meaningful recovery despite all aggressive and optimal medical therapy.   Family understands the situation.  They have consented and agreed to DNR/DNI and would like to proceed with Comfort care measures Family will decide on when to proceed with comfort measures when all family has visited and said their peace.   We will implement end of life visitation policy  Family are satisfied with Plan of action and management. All questions answered  Additional CC time 25 mins   Jhamir Pickup [name], M.D.  [name]  Medical Director [place] Director [facility] Cardio-Pulmonary Department

## 2020-12-21 NOTE — Progress Notes (Signed)
GOALS OF CARE DISCUSSION  The Clinical status was relayed to family in detail. All family members at bedside  Updated and notified of patients medical condition.    Patient remains unresponsive and will not open eyes to command.   Patient is having a weak cough and struggling to remove secretions.   Patient with increased WOB and using accessory muscles to breathe Explained to family course of therapy and the modalities    Patient with Progressive multiorgan failure with a very high probablity of a very minimal chance of meaningful recovery despite all aggressive and optimal medical therapy.  Patient is suffering and dying process with Severe brain damage  Family understands the situation.  They have consented and agreed to DNR/DNI and would like to proceed with Comfort care measures.  Family are satisfied with Plan of action and management. All questions answered  Additional CC time 20 mins   Ivee Poellnitz [name], M.D.  [name]  Medical Director [place] Director [facility] Cardio-Pulmonary Department

## 2020-12-21 NOTE — Progress Notes (Signed)
[facility]  ROUNDING NOTE   Subjective:   UOP 212m (29314m  Creatinine 3.84 (3.88) (3.83) (3.75)  Na 150.  Dextrose infusion   Weaned off norepinephrine  Objective:  Vital signs in last 24 hours:  Temp:  [96.8 F (36 C)-98.6 F (37 C)] 96.8 F (36 C) (06/02 0800) Pulse Rate:  [77-87] 77 (06/02 0800) Resp:  [11-28] 14 (06/02 0700) BP: (111-135)/(56-69) 135/69 (06/02 0800) SpO2:  [95 %-100 %] 100 % (06/02 0806) FiO2 (%):  [28 %] 28 % (06/02 0806) Weight:  [138.6 kg] 138.6 kg (06/02 0437)  Weight change: -5.5 kg Filed Weights   11/20/20 0444 11/21/20 0355 0606/30/22437  Weight: (!) 141.7 kg (!) 144.1 kg (!) 138.6 kg    Intake/Output: I/O last 3 completed shifts: In: 2132.5 [I.V.:914.6; NG/GT:917.9; IV Piggyback:300] Out: 150630Urine:1575]   Intake/Output this shift:  No intake/output data recorded.  Physical Exam: General: Critically ill  Head: ETT   Eyes: Eyes closed  Neck: trachea midline  Lungs:  PRVC FiO2 28%  Heart: Regular rate and rhythm  Abdomen:  Soft, nontender  Extremities: no peripheral edema.  Neurologic: Intubated, sedated  Skin: No lesions        Basic Metabolic Panel: Recent Labs  Lab 11/10/2020 2032 11/19/20 0459 11/19/20 1647 11/20/20 0428 11/21/20 0346 11/21/20 1648 [id number]  NA 144 145 145 147* 149*  --  150*  K 5.6* 5.2* 4.5 4.1 4.0  --  4.1  CL 112* 115* 112* 116* 119*  --  118*  CO2 22 22 24 22 22   --  23  GLUCOSE 144* 123* 100* 86 77  --  144*  BUN 106* 112* 105* 98* 92*  --  91*  CREATININE 3.42* 3.60* 3.75* 3.83* 3.88*  --  3.84*  CALCIUM 8.3* 8.0* 8.1* 8.1* 8.3*  --  8.6*  MG 2.2 2.4  --  1.9  --  2.3 2.3  PHOS 5.4* 5.9*  --  4.6  --  4.7* 4.6    Liver Function Tests: Recent Labs  Lab 10/27/2020 1207  AST 179*  ALT 159*  ALKPHOS 108  BILITOT 0.5  PROT 5.6*  ALBUMIN 2.8*   No results for input(s): LIPASE, AMYLASE in the last 168 hours. No results for input(s): AMMONIA in the last 168  hours.  CBC: Recent Labs  Lab 11/16/2020 1336 11/01/2020 1812 11/19/20 0459 11/19/20 1647 11/20/20 0428 11/20/20 1105 11/21/20 0315 11/21/20 0934 11/21/20 1648 11/21/20 2201 0606/30/22438  WBC 13.5*  --  11.4*  --  11.0*  --   --  10.0  --   --  10.0  NEUTROABS 12.0*  --   --   --   --   --   --  8.3*  --   --  8.3*  HGB 2.5*   < > 6.5*   < > 7.3*   < > 7.0* 7.0* 7.7* 7.4* 7.3*  HCT 7.9*   < > 18.9*  --  21.9*   < > 21.6* 20.9* 24.0* 23.1* 22.7*  MCV 68.1*  --  80.1  --  80.5  --   --  82.3  --   --  84.4  PLT 151  --  108*  --  92*  --   --  78*  --   --  85*   < > = values in this interval not displayed.    Cardiac Enzymes: No results for input(s): CKTOTAL, CKMB, CKMBINDEX, TROPONINI in the last  168 hours.  BNP: Invalid input(s): POCBNP  CBG: Recent Labs  Lab 11/21/20 1540 11/21/20 1930 11/21/20 2304 2020/11/23 0318 Nov 23, 2020 0725  GLUCAP 103* 137* 131* 134* 155*    Microbiology: Results for orders placed or performed during the hospital encounter of 10/21/2020  Resp Panel by RT-PCR (Flu A&B, Covid) Nasopharyngeal Swab     Status: None   Collection Time: 10/29/2020 12:07 PM   Specimen: Nasopharyngeal Swab; Nasopharyngeal(NP) swabs in vial transport medium  Result Value Ref Range Status   SARS Coronavirus 2 by RT PCR NEGATIVE NEGATIVE Final    Comment: (NOTE) SARS-CoV-2 target nucleic acids are NOT DETECTED.  The SARS-CoV-2 RNA is generally detectable in upper respiratory specimens during the acute phase of infection. The lowest concentration of SARS-CoV-2 viral copies this assay can detect is 138 copies/mL. A negative result does not preclude SARS-Cov-2 infection and should not be used as the sole basis for treatment or other patient management decisions. A negative result may occur with  improper specimen collection/handling, submission of specimen other than nasopharyngeal swab, presence of viral mutation(s) within the areas targeted by this assay, and inadequate  number of viral copies(<138 copies/mL). A negative result must be combined with clinical observations, patient history, and epidemiological information. The expected result is Negative.  Fact Sheet for Patients:  EntrepreneurPulse.com.au  Fact Sheet for Healthcare Providers:  IncredibleEmployment.be  This test is no t yet approved or cleared by the [place] FDA and  has been authorized for detection and/or diagnosis of SARS-CoV-2 by FDA under an Emergency Use Authorization (EUA). This EUA will remain  in effect (meaning this test can be used) for the duration of the COVID-19 declaration under Section 564(b)(1) of the Act, 21 U.S.C.section 360bbb-3(b)(1), unless the authorization is terminated  or revoked sooner.       Influenza A by PCR NEGATIVE NEGATIVE Final   Influenza B by PCR NEGATIVE NEGATIVE Final    Comment: (NOTE) The Xpert Xpress SARS-CoV-2/FLU/RSV plus assay is intended as an aid in the diagnosis of influenza from Nasopharyngeal swab specimens and should not be used as a sole basis for treatment. Nasal washings and aspirates are unacceptable for Xpert Xpress SARS-CoV-2/FLU/RSV testing.  Fact Sheet for Patients: EntrepreneurPulse.com.au  Fact Sheet for Healthcare Providers: IncredibleEmployment.be  This test is not yet approved or cleared by the [place] FDA and has been authorized for detection and/or diagnosis of SARS-CoV-2 by FDA under an Emergency Use Authorization (EUA). This EUA will remain in effect (meaning this test can be used) for the duration of the COVID-19 declaration under Section 564(b)(1) of the Act, 21 U.S.C. section 360bbb-3(b)(1), unless the authorization is terminated or revoked.  Performed at [facility], [address] [place], [place] [address]   CULTURE, BLOOD (ROUTINE X 2) w Reflex to ID Panel     Status: None (Preliminary result)   Collection  Time: 11/14/2020 12:41 PM   Specimen: BLOOD  Result Value Ref Range Status   Specimen Description BLOOD LEFT ASSIST CONTROL  Final   Special Requests   Final    BOTTLES DRAWN AEROBIC AND ANAEROBIC Blood Culture adequate volume   Culture   Final    NO GROWTH 3 DAYS Performed at [facility], [address] [place], [place] [address]    Report Status PENDING  Incomplete  CULTURE, BLOOD (ROUTINE X 2) w Reflex to ID Panel     Status: None (Preliminary result)   Collection Time: 11/12/2020 12:42 PM   Specimen: BLOOD  Result Value Ref Range Status   Specimen Description BLOOD RIGHT ASSIST CONTROL  Final   Special Requests   Final    BOTTLES DRAWN AEROBIC AND ANAEROBIC Blood Culture adequate volume   Culture   Final    NO GROWTH 3 DAYS Performed at [facility], [address], [place], [place] [address]    Report Status PENDING  Incomplete  Urine culture     Status: None   Collection Time: 11/14/2020  1:36 PM   Specimen: Urine, Random  Result Value Ref Range Status   Specimen Description   Final    URINE, RANDOM Performed at [facility], [address], [place], [place] [address]    Special Requests   Final    NONE Performed at [facility] [address], [place], [place] [address]    Culture   Final    NO GROWTH Performed at [facility], [place] [address] [place], [place] [address]    Report Status 11/20/2020 FINAL  Final  MRSA PCR Screening     Status: None   Collection Time: 11/06/2020  3:55 PM   Specimen: Nasal Mucosa; Nasopharyngeal  Result Value Ref Range Status   MRSA by PCR NEGATIVE NEGATIVE Final    Comment:        The GeneXpert MRSA Assay (FDA approved for NASAL specimens only), is one component of a comprehensive MRSA colonization surveillance program. It is not intended to diagnose MRSA infection nor to guide or monitor treatment for MRSA infections. Performed at [facility], [address]  [place], [place] [address]   Culture, respiratory (tracheal aspirate)     Status: None   Collection Time: 10/22/2020  4:30 PM   Specimen: Tracheal Aspirate; Respiratory  Result Value Ref Range Status   Specimen Description   Final    TRACHEAL ASPIRATE Performed at [facility], [address], [place], [place] [address]    Special Requests   Final    NONE Performed at [facility], [address] [place], [place] [address]    Gram Stain   Final    MODERATE WBC PRESENT, PREDOMINANTLY PMN NO ORGANISMS SEEN    Culture   Final    Normal respiratory flora-no Staph aureus or Pseudomonas seen Performed at [facility] [address], [place],  [address]    Report Status 11/20/2020 FINAL  Final  Respiratory (~20 pathogens) panel by PCR     Status: None   Collection Time: 10/29/2020  4:30 PM  Result Value Ref Range Status   Adenovirus NOT DETECTED NOT DETECTED Final   Coronavirus 229E NOT DETECTED NOT DETECTED Final    Comment: (NOTE) The Coronavirus on the Respiratory Panel, DOES NOT test for the novel  Coronavirus (2019 nCoV)    Coronavirus HKU1 NOT DETECTED NOT DETECTED Final   Coronavirus NL63 NOT DETECTED NOT DETECTED Final   Coronavirus OC43 NOT DETECTED NOT DETECTED Final   Metapneumovirus NOT DETECTED NOT DETECTED Final   Rhinovirus / Enterovirus NOT DETECTED NOT DETECTED Final   Influenza A NOT DETECTED NOT DETECTED Final   Influenza B NOT DETECTED NOT DETECTED Final   Parainfluenza Virus 1 NOT DETECTED NOT DETECTED Final   Parainfluenza Virus 2 NOT DETECTED NOT DETECTED Final   Parainfluenza Virus 3 NOT DETECTED NOT DETECTED Final   Parainfluenza Virus 4 NOT DETECTED NOT DETECTED Final   Respiratory Syncytial Virus NOT DETECTED NOT DETECTED Final   Bordetella pertussis NOT DETECTED  NOT DETECTED Final   Bordetella Parapertussis NOT DETECTED NOT DETECTED Final   Chlamydophila pneumoniae NOT DETECTED NOT DETECTED Final   Mycoplasma pneumoniae NOT  DETECTED NOT DETECTED Final    Comment: Performed at [facility], [place] [address], [place], [place] [address]    Coagulation Studies: No results for input(s): LABPROT, INR in the last 72 hours.  Urinalysis: No results for input(s): COLORURINE, LABSPEC, PHURINE, GLUCOSEU, HGBUR, BILIRUBINUR, KETONESUR, PROTEINUR, UROBILINOGEN, NITRITE, LEUKOCYTESUR in the last 72 hours.  Invalid input(s): APPERANCEUR    Imaging: DG Abd 1 View  Result Date: 11/21/2020 CLINICAL DATA:  Orogastric placement EXAM: ABDOMEN - 1 VIEW COMPARISON:  None FINDINGS: Orogastric tube enters the stomach with its tip in the fundus. Small amount of contrast injected confirming gastric positioning. Contrast leave the stomach and is seen in the proximal jejunum. IVC filter incidentally noted. IMPRESSION: Orogastric tube tip in the fundus of the stomach. Electronically Signed   By: Nelson Chimes M.D.   On: 11/21/2020 16:13   MR BRAIN WO CONTRAST  Result Date: 11/21/2020 CLINICAL DATA:  Cardiac arrest post hypothermia protocol EXAM: MRI HEAD WITHOUT CONTRAST TECHNIQUE: Multiplanar, multiecho pulse sequences of the brain and surrounding structures were obtained without intravenous contrast. COMPARISON:  June 05, 2020 FINDINGS: Brain: There is no acute infarction or intracranial hemorrhage. There is no intracranial mass, mass effect, or edema. There is no hydrocephalus or extra-axial fluid collection. Prominence of the ventricles and sulci reflects mild generalized parenchymal volume loss. Patchy and confluent areas of T2 hyperintensity in the supratentorial white matter are nonspecific but probably reflect mild to moderate chronic microvascular ischemic changes. Small chronic bilateral cerebellar infarcts. Vascular: Major vessel flow voids at the skull base are preserved. Skull and upper cervical spine: Normal marrow signal is preserved. Sinuses/Orbits: Paranasal sinuses are aerated. Orbits are unremarkable. Other: Sella is  unremarkable. Mastoid air cells are clear. Retained secretions within the included pharynx. IMPRESSION: No evidence of acute hypoxic/ischemic injury. Mild to moderate chronic microvascular ischemic changes. Small chronic cerebellar infarcts. Electronically Signed   By: Macy Mis M.D.   On: 11/21/2020 14:37     Medications:   . sodium chloride 10 mL/hr at 11/21/20 1906  . fentaNYL infusion INTRAVENOUS 125 mcg/hr (11/20/20 2202)  . levETIRAcetam 500 mg (12-03-20 0437)  . midazolam 2 mg/hr (11/21/20 0035)   . bacitracin   Topical BID  . chlorhexidine gluconate (MEDLINE KIT)  15 mL Mouth Rinse BID  . Chlorhexidine Gluconate Cloth  6 each Topical Q0600  . feeding supplement (PROSource TF)  45 mL Per Tube BID  . feeding supplement (VITAL HIGH PROTEIN)  1,000 mL Per Tube Q24H  . free water  30 mL Per Tube Q4H  . mouth rinse  15 mL Mouth Rinse 10 times per day  . pantoprazole (PROTONIX) IV  40 mg Intravenous QHS   docusate sodium, fentaNYL, fentaNYL (SUBLIMAZE) injection, midazolam, midazolam, polyethylene glycol  Assessment/ Plan:  Mr. Vaiden Adames. is a 80 y.o. black male with diabetes mellitus type II, peripheral vascular disease, diabetic lower extremity ulcers, hypertension, congestive heart failure, BPH, DVT, lewy body dementia, sleep apnea, seizures disorder, gout, Parkinson's, who is admitted to [facility] on 10/23/2020 on Cardiac arrest ([place]) [I46.9] Abnormal chest x-ray [R93.89]  1. Acute kidney injury on chronic kidney disease stage IIIB: baseline creatinine 2.19, GFR of 30 on 10/21/2020. With proteinuria.  Hyperkalemia has resolved.  No IV contrast exposure No indication for dialysis. Nonoliuguric urine output.  - Monitor for diuretic  need.  Kidney function seems to have reached a plateau. Monitor for renal recovery.   2. Acute anemia on kidney failure: status post PRBC transfusions. Hemoglobin 7.3 Appreciate hematology input.   3. Hypotension: weaned off norepinephrine  4.  Hypernatremia: 150. With free water deficit.  - Continue D5W infusion.    LOS: 4 Justin Shannon June 23, 20229:23 AM

## 2020-12-21 NOTE — Progress Notes (Signed)
Neurology Progress Note  HPI from initial consult note Patient is comatose and unable to provide hx. Per ICU H&P: "This is 80 year old male with a history of CKD 4, chronic venous insufficiency, DVT on Xarelto, peripheral artery disease, CHF seen by [facility] last transthoracic echo done December 2021, morbid obesity, obstructive sleep apnea on CPAP device at home, type 2 diabetes, Lewy body dementia with parkinsonism, chronic anemia history of total knee replacement, seizure disorder came in status post cardiac arrest. Received ACLS 20 minutes with ROSC. Lost pulse again and required ACLS second time followed by ROSC required endotracheal intubation in the ER. Medical intensive care unit admission post cardiac arrest.  Evaluated patient with nephrologist together at the bedside. Family was present during my evaluation. They deny flulike illness malaise, myalgia he did previously have COVID-19 and recovered to baseline. Additional review of systems reveals decreased appetite over 48 hours. Denies diarrhea nausea vomiting or any additional symptoms. Labs show chronic anemia, CKD metabolic acidosis with high anion gap. Vitals without hypotension or shock. On exam GCS 4 T with plan to perform CT head to rule out intracerebral hemorrhage followed by [place] cooling protocol post cardiac arrest." CTH was unremarkable. When I evaluated patient at bedside he had rhythmic twitching of his right face and RUE as well as intermittent oral automatisms.   S:// Seen and examined this AM His daughter again was at bedside today. Greater than 72 hours post rewarming   O:// Current vital signs: BP 123/60   Pulse 73   Temp (!) 96.08 F (35.6 C)   Resp 17   Ht 5' 10.5" (1.791 m)   Wt (!) 138.6 kg   SpO2 100%   BMI 43.22 kg/m  Vital signs in last 24 hours: Temp:  [96.08 F (35.6 C)-98.6 F (37 C)] 96.08 F (35.6 C) (06/02 1100) Pulse Rate:  [73-87] 73 (06/02 1100) Resp:  [11-28] 17 (06/02  1100) BP: (111-143)/(56-82) 123/60 (06/02 1000) SpO2:  [96 %-100 %] 100 % (06/02 1125) FiO2 (%):  [28 %] 28 % (06/02 1125) Weight:  [138.6 kg] 138.6 kg (06/02 0437) General: Intubated HEENT: Normocephalic/atraumatic CVs: Regular rate rhythm Respiratory: Vented Neurologic exam He is intubated, on no sedation for greater than 24 hours No spontaneous movement To noxious stimulation, attempts to open eyes.  Does not follow any commands. Cranial nerves: Pupils are pinpoint and sluggishly reactive, corneals are present, breathing over the ventilator, facial symmetry difficult to ascertain. Motor exam: To noxious stimulation, times open eyes and bare minimum attempt to localization if any today. Sensory exam: As above   Medications  Current Facility-Administered Medications:  .  0.9 %  sodium chloride infusion, , Intravenous, Continuous, Rust-Chester, Britton L, NP, Last Rate: 10 mL/hr at 11/21/20 1906, Infusion Verify at 11/21/20 1906 .  bacitracin ointment, , Topical, BID, Rust-Chester, Huel Cote, NP, Given at 2020/11/29 0916 .  chlorhexidine gluconate (MEDLINE KIT) (PERIDEX) 0.12 % solution 15 mL, 15 mL, Mouth Rinse, BID, Lanney Gins, Fuad, MD, 15 mL at 29-Nov-2020 0735 .  Chlorhexidine Gluconate Cloth 2 % PADS 6 each, 6 each, Topical, Q0600, Ottie Glazier, MD, 6 each at 11/29/20 0916 .  dextrose 5 % solution, , Intravenous, Continuous, Kolluru, Sarath, MD, Last Rate: 50 mL/hr at 29-Nov-2020 0948, New Bag at 11-29-20 0948 .  docusate sodium (COLACE) capsule 100 mg, 100 mg, Oral, BID PRN, Aleskerov, Fuad, MD .  feeding supplement (PROSource TF) liquid 45 mL, 45 mL, Per Tube, BID, Flora Lipps, MD, 45 mL at 2020/11/29 0916 .  feeding supplement (VITAL HIGH PROTEIN) liquid 1,000 mL, 1,000 mL, Per Tube, Q24H, Kasa, Kurian, MD, 1,000 mL at 15-Dec-2020 0915 .  fentaNYL (SUBLIMAZE) injection 50 mcg, 50 mcg, Intravenous, Q1H PRN, Mortimer Fries, Kurian, MD .  free water 30 mL, 30 mL, Per Tube, Q4H, Kasa, Kurian, MD, 30  mL at Dec 15, 2020 0800 .  [COMPLETED] levETIRAcetam (KEPPRA) IVPB 1000 mg/100 mL premix, 1,000 mg, Intravenous, Once, Stopped at 11/05/2020 1848 **FOLLOWED BY** levETIRAcetam (KEPPRA) IVPB 500 mg/100 mL premix, 500 mg, Intravenous, Q12H, Derek Jack, MD, Last Rate: 400 mL/hr at 2020-12-15 0437, 500 mg at 12/15/2020 0437 .  MEDLINE mouth rinse, 15 mL, Mouth Rinse, 10 times per day, Ottie Glazier, MD, 15 mL at 12/15/20 0916 .  midazolam (VERSED) injection 2-4 mg, 2-4 mg, Intravenous, Q1H PRN, Flora Lipps, MD .  pantoprazole (PROTONIX) injection 40 mg, 40 mg, Intravenous, QHS, Rust-Chester, Britton L, NP, 40 mg at 11/21/20 2126 .  polyethylene glycol (MIRALAX / GLYCOLAX) packet 17 g, 17 g, Per Tube, Daily PRN, Ottie Glazier, MD Labs CBC    Component Value Date/Time   WBC 10.0 December 15, 2020 0438   RBC 2.69 (L) 2020/12/15 0438   HGB 7.2 (L) December 15, 2020 0956   HGB 12.6 (L) 10/23/2012 0519   HCT 23.1 (L) 15-Dec-2020 0956   HCT 39.4 (L) 10/23/2012 0519   PLT 85 (L) Dec 15, 2020 0438   PLT 174 10/23/2012 0519   MCV 84.4 12/15/2020 0438   MCV 72 (L) 10/23/2012 0519   MCH 27.1 12/15/20 0438   MCHC 32.2 2020/12/15 0438   RDW 23.6 (H) Dec 15, 2020 0438   RDW 18.1 (H) 10/23/2012 0519   LYMPHSABS 0.7 Dec 15, 2020 0438   LYMPHSABS 1.6 10/23/2012 0519   MONOABS 0.8 12-15-2020 0438   MONOABS 0.5 10/23/2012 0519   EOSABS 0.1 15-Dec-2020 0438   EOSABS 0.1 10/23/2012 0519   BASOSABS 0.0 2020-12-15 0438   BASOSABS 0.0 10/23/2012 0519    CMP     Component Value Date/Time   NA 150 (H) 2020/12/15 0438   NA 140 10/23/2012 0519   K 4.1 2020-12-15 0438   K 4.4 10/23/2012 0519   CL 118 (H) 12/15/20 0438   CL 109 (H) 10/23/2012 0519   CO2 23 December 15, 2020 0438   CO2 24 10/23/2012 0519   GLUCOSE 144 (H) Dec 15, 2020 0438   GLUCOSE 144 (H) 10/23/2012 0519   BUN 91 (H) 2020/12/15 0438   BUN 26 (H) 10/23/2012 0519   CREATININE 3.84 (H) 2020/12/15 0438   CREATININE 1.73 (H) 10/23/2012 0519   CALCIUM 8.6 (L)  Dec 15, 2020 0438   CALCIUM 9.6 10/23/2012 0519   PROT 5.6 (L) 10/27/2020 1207   PROT 7.9 09/02/2011 1539   ALBUMIN 2.8 (L) 10/31/2020 1207   ALBUMIN 3.7 09/02/2011 1539   AST 179 (H) 11/06/2020 1207   AST 46 (H) 09/02/2011 1539   ALT 159 (H) 10/30/2020 1207   ALT 53 09/02/2011 1539   ALKPHOS 108 11/11/2020 1207   ALKPHOS 74 09/02/2011 1539   BILITOT 0.5 11/11/2020 1207   BILITOT 0.4 09/02/2011 1539   GFRNONAA 15 (L) December 15, 2020 0438   GFRNONAA 39 (L) 10/23/2012 0519   GFRAA 36 (L) 12/07/2019 1652   GFRAA 56 (L) 10/23/2012 0519    Imaging I have reviewed images in epic and the results pertinent to this consultation are: MRI brain: No evidence of acute injury  Assessment:  79 year old man history of CKD, chronic venous insufficiency, DVT on Xarelto, peripheral arterial disease, CHF, morbid obesity, sleep apnea on CPAP, type  2 diabetes, Lewy body dementia with parkinsonism, chronic anemia with history of total knee replacement came in status post cardiac arrest.  Also had critically low hemoglobin with 2.5 without a clear source of bleeding requiring multiple transfusions. On initial neurological examination had focal twitching of the right face and arm with concern for focal seizure activity emanating from the right hemisphere.  Also had some orofacial automatisms. Seizure history dates back to many years ago-has been stable on Keppra. He was on TTM and has been rewarmed now since 9 PM on 11/19/2020. Examination with eye-opening to noxious stimulation and questionable attempts to localization in spite of reduced and discontinued sedation. Suspect some component of hypoxic/anoxic brain damage. MRI brain completed: No evidence of acute injury  Clinically not doing very well.  Do not anticipate recovery back to the baseline where he was at, which was already very poor to begin with with advanced Lewy body dementia and parkinsonism.   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  History of Lewy body dementia with significant cognitive decline over the past few months to years  Recommendations:  Continue supportive care per primary team as you are  Continue home Keppra  Minimize sedation and do wake-up assessments as much as possible  Continue goals of care conversations with family-appreciate PCCM and palliative medicine team leading the conversations.  Given poor brain reserve at baseline, ongoing cognitive decline due to the neurodegenerative process of Lewy body dementia and parkinsonism, cardiac arrest along with multiorgan dysfunction, every day that he does not show meaningful exam, decreases his chances of any neurological meaningful recovery.  I discussed this in detail with the family again.  They had specific questions on his chances of recovery.  I told them that there is very less likely that he would come back to the baseline that he was at, which was to begin with very poor.  The daughter has a good handle on things and understanding of this with the son who was on the video call seemed to be hopeful, somewhat unrealistically in my mind.  I did try to make sure that I clearly relay the concept of neurologically meaningful recovery as well as how every insult that happens in a person with neurodegeneration leaves them more disabled than they were to begin with.  I also mentioned that this point onwards, focus should be on his comfort rather than doing more procedures because neurologically meaningful recovery is less likely given all of the medical comorbidities and already advanced dementia.  I have relayed this in detail to the  daughter at the bedside, wife over videoconference, another daughter and son over video conference.  Discussed my plan with Dr. Jane Canary care  -- Amie Portland, MD Neurologist [facility] Pager: [phone]   CRITICAL CARE ATTESTATION Performed by: Amie Portland, MD Total critical care time: 31 minutes Critical care time was exclusive of separately billable procedures and treating other patients and/or supervising APPs/Residents/Students Critical care was necessary to treat or prevent imminent or life-threatening deterioration due to post cardiac hypoxic/anoxic brain injury, seizures, severe symptomatic anemia with concern for stroke This patient is critically ill and at significant risk for neurological worsening and/or death and care requires constant monitoring.  Critical care was time spent personally by me on the following activities: development of treatment plan with patient and/or surrogate as well as nursing, discussions with consultants, evaluation of patient's response to treatment, examination of patient, obtaining history from patient or surrogate, ordering and performing treatments and interventions, ordering and review of laboratory studies, ordering and review of radiographic studies, pulse oximetry, re-evaluation of patient's condition, participation in multidisciplinary rounds and medical decision making of high complexity in the care of this patient.

## 2020-12-21 NOTE — Progress Notes (Signed)
[place] for Electrolyte Monitoring and Replacement   Recent Labs: Potassium (mmol/L)  Date Value  2020-12-14 4.1  10/23/2012 4.4   Magnesium (mg/dL)  Date Value  12/14/20 2.3  09/02/2011 1.7 (L)   Calcium (mg/dL)  Date Value  12/14/2020 8.6 (L)   Calcium, Total (mg/dL)  Date Value  10/23/2012 9.6   Albumin (g/dL)  Date Value  11/20/2020 2.8 (L)  09/02/2011 3.7   Phosphorus (mg/dL)  Date Value  12/14/20 4.6   Sodium (mmol/L)  Date Value  2020/12/14 150 (H)  10/23/2012 140    Assessment: 80 y.o. male with a hx of CKD, HFpEF, BPH, hypertension, dementia, DVT on Xarelto admitted on 11/03/2020 s/p cardiac arrest. Received ACLS 20 minutes with ROSC. Lost pulse again and required ACLS second time followed by ROSC. Patient required intubation in the ER. Patient undergoing TTM, with normothermia achieved 5/30 at 2114. Neurology is following the patient. Pharmacy has been asked to follow electrolytes and replace as needed. He has noted renal function impairment.  Goal of Therapy:  Potassium 4.0 - 5.1 mmol/L Magnesium 2.0 - 2.4 mg/dL All Other Electrolytes WNL  Plan:   Na 150, patient started on D5w at 50 mL/hr  Continue to follow along with CCU medical team to optimize electrolytes  Benita Gutter 12-14-20 7:25 AM

## 2020-12-21 NOTE — Progress Notes (Signed)
Assisted tele visit to patient with family members.  Thomas, Keary Hanak Renee, RN  

## 2020-12-21 NOTE — Progress Notes (Addendum)
Daily Progress Note   Patient Name: Justin Shannon.       Date: 28-Nov-2020 DOB: 1941-03-26  Age: 80 y.o. MRN#: [id number] Attending Physician: Flora Lipps, MD Primary Care Physician: [name], DO Admit Date: 10/29/2020  Reason for Consultation/Follow-up: Establishing goals of care  Subjective: Patient is resting in bed on ventilator. No sedation at this time and patient has eyes closed. Daughter who is HPOA is at bedside. She states they have been well updated. She states when he is extubated, the plan will be a focus on comfort care. Discussed options and prognosis based on comfort focus. She is aware if he has a limited amount of time such as days, he would die here. She understands if his prognosis is longer than that, he would be a candidate for home with hospice or the hospice facility. She states she will consider all this in her decision making.   Length of Stay: 4  Current Medications: Scheduled Meds:  . bacitracin   Topical BID  . chlorhexidine gluconate (MEDLINE KIT)  15 mL Mouth Rinse BID  . Chlorhexidine Gluconate Cloth  6 each Topical Q0600  . feeding supplement (PROSource TF)  45 mL Per Tube BID  . feeding supplement (VITAL HIGH PROTEIN)  1,000 mL Per Tube Q24H  . free water  30 mL Per Tube Q4H  . mouth rinse  15 mL Mouth Rinse 10 times per day  . pantoprazole (PROTONIX) IV  40 mg Intravenous QHS    Continuous Infusions: . sodium chloride 10 mL/hr at 11/21/20 1906  . dextrose 50 mL/hr at 2020/11/28 0948  . levETIRAcetam 500 mg (11/28/2020 0437)    PRN Meds: docusate sodium, fentaNYL (SUBLIMAZE) injection, midazolam, polyethylene glycol  Physical Exam Constitutional:      Comments: Eyes closed.              Vital Signs: BP 134/76   Pulse 78   Temp  97.6 F (36.4 C) (Oral)   Resp 19   Ht 5' 10.5" (1.791 m)   Wt (!) 138.6 kg   SpO2 99%   BMI 43.22 kg/m  SpO2: SpO2: 99 % O2 Device: O2 Device: Ventilator O2 Flow Rate:    Intake/output summary:   Intake/Output Summary (Last 24 hours) at 11-28-20 1621 Last data filed at 11-28-2020 1223 Gross per  24 hour  Intake 1443.01 ml  Output 950 ml  Net 493.01 ml   LBM: Last BM Date: 12/19/20 Baseline Weight: Weight: (!) 142.8 kg Most recent weight: Weight: (!) 138.6 kg          Patient Active Problem List   Diagnosis Date Noted  . Severe anemia 11/19/2020  . Cardiac arrest ([place]) 11/10/2020  . Parkinsonism ([place]) 08/23/2020  . Heart failure with preserved ejection fraction, borderline, class II ([place]) 08/23/2020  . Slurred speech 06/04/2020  . CKD (chronic kidney disease) stage 3, GFR 30-59 ml/min (H[facility]) 06/04/2020  . Essential hypertension 06/04/2020  . Hyperlipidemia 06/04/2020  . Lewy body dementia without behavioral disturbance ([place]) 05/21/2020  . Hyperlipidemia associated with type 2 diabetes mellitus ([place]) 05/21/2020  . Status post total right knee replacement 01/22/2020  . Anemia of chronic kidney failure, stage 3 (moderate) ([place]) 01/16/2020  . Total knee replacement status 11/02/2019  . History of total knee arthroplasty 11/02/2019  . PAD (peripheral artery disease) ([place]) 10/13/2019  . Benign prostatic hyperplasia   . Seizures ([place])   . Anemia due to stage 4 chronic kidney disease ([place]) 06/24/2019  . Generalized weakness 06/24/2019  . Anemia in chronic kidney disease ([facility]) 06/24/2019  . Asthenia 06/24/2019  . Primary osteoarthritis of right knee 04/03/2019  . Use of cane as ambulatory aid 04/27/2018  . Dependence on other enabling machines and devices 04/27/2018  . Type 2 diabetes mellitus with stage 4 chronic kidney disease, without long-term current use of insulin ([place]) 06/11/2017  . Lymphedema 05/04/2016  . Chronic venous insufficiency 05/04/2016  . Toe sprain  01/24/2016  . Morbid obesity with body mass index of 40.0-44.9 in adult [facility]) 05/24/2014  . Body mass index (BMI)40.0-44.9, adult 05/24/2014  . OSA on CPAP 01/10/2014  . CKD (chronic kidney disease), stage IV ([place]) 11/22/2013  . Allergic rhinitis 11/06/2013  . Benign hypertension with CKD (chronic kidney disease) stage IV ([place]) 11/06/2013  . Epilepsy ([place]) 11/06/2013  . Gout 11/06/2013  . History of DVT (deep vein thrombosis) 11/06/2013  . Chronic deep vein thrombosis (DVT) of proximal vein of lower extremity ([place]) 11/06/2013  . 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 ([place]) 11/06/2013  . Personal history of other venous thrombosis and embolism 11/06/2013    Palliative Care Assessment & Plan    Recommendations/Plan:  Waiting for extubation. Sounds as though overall plan is a focus on comfort care. Daughter is aware he would be a candidate for hospice facility or home with hospice if they choose. Please have TOC contact hospice if they choose this route.     Code Status:    Code Status Orders  (From admission, onward)         Start     Ordered   11/20/20 1327  Limited resuscitation (code)  Continuous       Question Answer Comment  In the event of cardiac or respiratory ARREST: Initiate Code Blue, Call Rapid Response No   In the event of cardiac or respiratory ARREST: Perform CPR No   In the event of cardiac or respiratory ARREST: Perform Intubation/Mechanical Ventilation Yes   In the event of cardiac or respiratory ARREST: Use NIPPV/BiPAp only if indicated Yes   In the event of cardiac or respiratory ARREST: Administer ACLS medications if indicated No   In the event of cardiac or respiratory ARREST: Perform Defibrillation or Cardioversion if indicated No   Comments Continue current intubation. MOST form on chart.  11/20/20 1327        Code Status History    Date Active Date Inactive Code Status Order ID  Comments User Context   10/21/2020 1958 11/20/2020 1327 Full Code [id number]  Rust-ChesterHuel Cote, NP Inpatient   11/05/2020 [place] 11/14/2020 1958 Full Code [id number]  Ottie Glazier, MD ED   07/21/2020 2332 07/22/2020 2033 Partial Code [id number]  Orene Desanctis, DO ED   07/21/2020 2251 07/21/2020 2332 Full Code [id number]  Orene Desanctis, DO ED   06/04/2020 1923 06/06/2020 2324 Full Code [id number]  Lenore Cordia, MD ED   12/05/2019 1809 12/08/2019 2341 Full Code [id number]  Dereck Leep, MD Inpatient   11/02/2019 1726 11/07/2019 2259 Full Code [id number]  Dereck Leep, MD Inpatient   06/24/2019 2136 07/01/2019 1743 Full Code [id number]  Orene Desanctis, DO ED   Advance Care Planning Activity       Prognosis:   < 2 weeks   Thank you for allowing the Palliative Medicine Team to assist in the care of this patient.   Total Time 15 min Prolonged Time Billed  no      Greater than 50%  of this time was spent counseling and coordinating care related to the above assessment and plan.  Asencion Gowda, NP  Please contact Palliative Medicine Team phone at [phone] for questions and concerns.

## 2020-12-21 NOTE — Progress Notes (Signed)
Patient expired at 2210 with family at the bedside. [facility] notified & patient post- mortem care completed.

## 2020-12-21 NOTE — Progress Notes (Signed)
Assisted tele visit to patient with family members.

## 2020-12-21 NOTE — Progress Notes (Signed)
   2020/12/17 1721  Clinical Encounter Type  Visited With Patient and family together  Visit Type Initial;Spiritual support  Referral From Nurse  Consult/Referral To [facility] responded to a page in [place]. Multiple family members by the Pt's bedside. The nurse stated the family is having a hard time because the Pt remains unresponsive and will not open his eyes to commands. The family have consented and agreed to DNR/DNI and would like to proceed with Comfort Care. I provided a ministry of presence, spiritual and emotional support. I also provided reflective listening, and prayer was given and received at the end of the visit.

## 2020-12-21 NOTE — Progress Notes (Signed)
GOALS OF CARE DISCUSSION  The Clinical status was relayed to family in detail. Daughter at bedside  Updated and notified of patients medical condition.   Patient remains unresponsive and will not open eyes to command.   Patient is having a weak cough and struggling to remove secretions.   Patient with increased WOB and using accessory muscles to breathe Explained to family course of therapy and the modalities    Patient with Progressive multiorgan failure with a very high probablity of a very minimal chance of meaningful recovery despite all aggressive and optimal medical therapy.  PATIENT REMAINS DNR STATUS   Family understands the situation. Plan for PS mode Patient has increased WOB Very poor prognosis Patient would NOT want trach/peg tube    Family are satisfied with Plan of action and management. All questions answered  Additional CC time 35 mins   Justin Shannon [name], M.D.  [name]  Medical Director [place] Director [facility] Cardio-Pulmonary Department

## 2020-12-21 NOTE — Death Summary Note (Signed)
DEATH SUMMARY   Patient Details  Name: Justin Shannon. MRN: [id number] DOB: 1941/05/11  Admission/Discharge Information   Admit Date:  07-Dec-2020  Date of Death: Date of Death: 12-11-2020  Time of Death: Time of Death: 10/01/2208  Length of Stay: 4  Referring Physician: [name], DO   Reason(s) for Hospitalization  Severe Anemia s/p PEA Cardiac Arrest  Diagnoses  Preliminary cause of death:  Secondary Diagnoses (including complications and co-morbidities):  Active Problems:   Cardiac arrest [facility])   Severe anemia    Brief Hospital Course (including significant findings, care, treatment, and services provided and events leading to death)  Justin Shannon. is a 80 y.o. year old male  With significant PMH of CKD 4, chronic venous insufficiency, DVT on Xarelto, peripheral artery disease, CHF followed by [facility] last transthoracic echo done December 2021, morbid obesity, obstructive sleep apnea on CPAP device at home, type 2 diabetes, Lewy body dementia with parkinsonism, chronic anemia history of total knee replacement, seizure disorder who presented to [facility] ED on 12-07-2020 status post PEA cardiac arrest. Received ACLS 20 minutes with ROSC.  Lost pulse again and required  Second round of CPR/ACLS with ROSC required endotracheal intubation in the ER and transferred to the ICU and placed on TTM per protocol.  Patient was found to have severe anemia with hemoglobin 2.5 [on admission]-s/p 6 units of PRBC; likely the cause of decompensation/precipitating an acute neurologic/cardiac event. Etiology remained  unclear however appeared acute on chronic iron deficiency. No obvious GI losses noted; CT scan negative for any intra-abdominal bleed.  Oncology was consulted with recommendation for  Hemolysis work-up and IV iron replacement. Unfortunately inspite of anemia correction, patient's neurological status continued to decline with no meaningful recovery. Neurology was also consulted and per  their recommendations, Given poor brain reserve at baseline, ongoing cognitive decline due to the neurodegenerative process of Lewy body dementia and parkinsonism, cardiac arrest along with multiorgan dysfunction, every day that he does not show meaningful exam, decreases his chances of any neurological meaningful recovery. Family was informed that  there was very less likely that he would come back to the baseline that he was at, which was to begin with very poor.  Palliative was consulted and goals of care initiated with family consenting to terminal extubation and proceeding with comfort care measure. Patient was terminally extubated to room air on December 11, 2020 at 8:20 pm and passed away peacefully at 1010 pm with family at the bedside.  Pertinent Labs and Studies  Significant Diagnostic Studies DG Abd 1 View  Result Date: 11/21/2020 CLINICAL DATA:  Orogastric placement EXAM: ABDOMEN - 1 VIEW COMPARISON:  None FINDINGS: Orogastric tube enters the stomach with its tip in the fundus. Small amount of contrast injected confirming gastric positioning. Contrast leave the stomach and is seen in the proximal jejunum. IVC filter incidentally noted. IMPRESSION: Orogastric tube tip in the fundus of the stomach. Electronically Signed   By: Nelson Chimes M.D.   On: 11/21/2020 16:13   CT HEAD WO CONTRAST  Result Date: December 07, 2020 CLINICAL DATA:  Suspected stroke. EXAM: CT HEAD WITHOUT CONTRAST TECHNIQUE: Contiguous axial images were obtained from the base of the skull through the vertex without intravenous contrast. COMPARISON:  04/20/2021 FINDINGS: Brain: No evidence of acute infarction, hemorrhage, hydrocephalus, extra-axial collection or mass lesion/mass effect. Moderate brain parenchymal volume loss and deep white matter microangiopathy. 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 Vascular: Calcific atherosclerotic disease of the intra cavernous carotid  arteries. Skull: Normal.  Negative for fracture or focal lesion. Sinuses/Orbits: No acute finding. Other: None. IMPRESSION: 1. No acute intracranial abnormality. 2. Atrophy, chronic microvascular disease. Electronically Signed   By: Fidela Salisbury M.D.   On: 10/28/2020 14:26   MR BRAIN WO CONTRAST  Result Date: 11/21/2020 CLINICAL DATA:  Cardiac arrest post hypothermia protocol EXAM: MRI HEAD WITHOUT CONTRAST TECHNIQUE: Multiplanar, multiecho pulse sequences of the brain and surrounding structures were obtained without intravenous contrast. COMPARISON:  June 05, 2020 FINDINGS: Brain: There is no acute infarction or intracranial hemorrhage. There is no intracranial mass, mass effect, or edema. There is no hydrocephalus or extra-axial fluid collection. Prominence of the ventricles and sulci reflects mild generalized parenchymal volume loss. Patchy and confluent areas of T2 hyperintensity in the supratentorial white matter are nonspecific but probably reflect mild to moderate chronic microvascular ischemic changes. Small chronic bilateral cerebellar infarcts. Vascular: Major vessel flow voids at the skull base are preserved. Skull and upper cervical spine: Normal marrow signal is preserved. Sinuses/Orbits: Paranasal sinuses are aerated. Orbits are unremarkable. Other: Sella is unremarkable. Mastoid air cells are clear. Retained secretions within the included pharynx. IMPRESSION: No evidence of acute hypoxic/ischemic injury. Mild to moderate chronic microvascular ischemic changes. Small chronic cerebellar infarcts. Electronically Signed   By: Macy Mis M.D.   On: 11/21/2020 14:37   DG Chest Port 1 View  Result Date: 10/29/2020 CLINICAL DATA:  80 year old male with central line placement. EXAM: PORTABLE CHEST 1 VIEW COMPARISON:  Earlier chest radiograph dated 11/04/2020 and CT dated 11/15/2020 FINDINGS: Interval placement of a right IJ central venous line with tip close to the cavoatrial junction or over the right atrium.  Endotracheal tube approximately 2 cm above the carina. The tube can be pulled back by 2 cm for optimal positioning. Enteric tube with tip in the left upper abdomen likely in the gastric fundus. Shallow inspiration with bilateral streaky densities similar to prior radiograph. No large pleural effusion or pneumothorax. Stable cardiomegaly. Atherosclerotic calcification of the aorta. No acute osseous pathology. IMPRESSION: 1. Interval placement of a right IJ central venous line with tip close to the cavoatrial junction. No pneumothorax. 2. Endotracheal tube above the carina. The tube can be pulled back by 2 cm for optimal positioning. 3. No interval change in bilateral pulmonary opacities. Electronically Signed   By: Anner Crete M.D.   On: 10/22/2020 15:27   DG Chest Portable 1 View  Result Date: 10/23/2020 CLINICAL DATA:  80 year old male status post intubation. EXAM: PORTABLE CHEST 1 VIEW COMPARISON:  Chest x-ray 06/28/2020. FINDINGS: An endotracheal tube is in place with tip 2.5 cm above the carina. A nasogastric tube is seen extending into the stomach, however, the tip of the nasogastric tube extends below the lower margin of the image. Transcutaneous defibrillator pads project over the lower left hemithorax. Lung volumes are low. Cephalization of the pulmonary vasculature. Airspace consolidation in the right upper lobe and periphery of the right lung base. Left lung otherwise appears clear. No definite pleural effusions. No pneumothorax. Heart size is mildly enlarged. Upper mediastinal contours are distorted by patient positioning. Aortic atherosclerosis. IMPRESSION: 1. Support apparatus, as above. 2. Right upper lobe airspace consolidation and additional consolidative changes in the right lung base concerning for multilobar pneumonia. 3. Cardiomegaly with pulmonary venous congestion. 4. Aortic atherosclerosis. Electronically Signed   By: Vinnie Langton M.D.   On: 10/28/2020 12:29   DG Abd Portable 1  View  Result Date: 11/17/2020 CLINICAL DATA:  80 year old male status  post intubation. EXAM: PORTABLE ABDOMEN - 1 VIEW COMPARISON:  No priors. FINDINGS: Nasogastric tube in the mid stomach. IVC filter projecting over the right mid abdomen. Gas and stool are seen scattered throughout the colon extending to the level of the distal rectum. No pathologic distension of small bowel is noted. No gross evidence of pneumoperitoneum. IMPRESSION: 1. Support apparatus, as above. 2. Nonobstructive bowel gas pattern. Electronically Signed   By: Vinnie Langton M.D.   On: 11/04/2020 12:31   EEG adult  Result Date: 11/19/2020 Lora Havens, MD     11/19/2020  3:21 PM Patient Name: Justin Shannon. MRN: [id number] Epilepsy Attending: Lora Havens Referring Physician/Provider: Olga Millers, NP Date: 11/19/2020 Duration: 22.29 mins Patient history: 80yo M with witnessed focal seizure activity post cardiac arrest. EEG to evaluate for seizure Level of alertness: comatose AEDs during EEG study: LEV, versed Technical aspects: This EEG study was done with scalp electrodes positioned according to the 10-20 International system of electrode placement. Electrical activity was acquired at a sampling rate of 500Hz  and reviewed with a high frequency filter of 70Hz  and a low frequency filter of 1Hz . EEG data were recorded continuously and digitally stored. Description: EEG showed near continuous generalized 3 to 6 Hz theta-delta slowing with brief periods of generalized attenuation lasting 1-3 seconds. Hyperventilation and photic stimulation were not performed.   ABNORMALITY - Continuous slow, generalized IMPRESSION: This study is suggestive of severe diffuse encephalopathy, nonspecific etiology. No seizures or epileptiform discharges were seen throughout the recording. Lora Havens   ECHOCARDIOGRAM COMPLETE  Result Date: 11/15/2020    ECHOCARDIOGRAM REPORT   Patient Name:   [facility]. Date of Exam: 11/06/2020  Medical Rec #:  [id number]          Height:       70.5 in Accession #:    [id number]         Weight:       314.8 lb Date of Birth:  02/25/1941           BSA:          2.545 m Patient Age:    79 years           BP:           134/69 mmHg Patient Gender: M                  HR:           92 bpm. Exam Location:  [facility] Procedure: 2D Echo and Intracardiac Opacification Agent Indications:     Cardiac arrest I46.9  History:         Patient has no prior history of Echocardiogram examinations.  Sonographer:     Kathlen Brunswick RDCS Referring Phys:  [id number] Ottie Glazier Diagnosing Phys: Ida Rogue MD  Sonographer Comments: Technically challenging study due to limited acoustic windows, suboptimal subcostal window and echo performed with patient supine and on artificial respirator. Image acquisition challenging due to respiratory motion and Image acquisition challenging due to patient body habitus. IMPRESSIONS  1. Left ventricular ejection fraction, by estimation, is 60 to 65%. The left ventricle has normal function. The left ventricle has no regional wall motion abnormalities. There is moderate left ventricular hypertrophy. Left ventricular diastolic parameters are consistent with Grade I diastolic dysfunction (impaired relaxation).  2. Right ventricular systolic function is normal. The right ventricular size is normal. Tricuspid regurgitation signal is inadequate for assessing PA pressure.  3. The mitral valve is  normal in structure. No evidence of mitral valve regurgitation. No evidence of mitral stenosis. FINDINGS  Left Ventricle: Left ventricular ejection fraction, by estimation, is 60 to 65%. The left ventricle has normal function. The left ventricle has no regional wall motion abnormalities. Definity contrast agent was given IV to delineate the left ventricular  endocardial borders. The left ventricular internal cavity size was normal in size. There is moderate left ventricular hypertrophy. Left ventricular diastolic  parameters are consistent with Grade I diastolic dysfunction (impaired relaxation). Right Ventricle: The right ventricular size is normal. No increase in right ventricular wall thickness. Right ventricular systolic function is normal. Tricuspid regurgitation signal is inadequate for assessing PA pressure. Left Atrium: Left atrial size was normal in size. Right Atrium: Right atrial size was normal in size. Pericardium: There is no evidence of pericardial effusion. Mitral Valve: The mitral valve is normal in structure. No evidence of mitral valve regurgitation. No evidence of mitral valve stenosis. Tricuspid Valve: The tricuspid valve is normal in structure. Tricuspid valve regurgitation is mild . No evidence of tricuspid stenosis. Aortic Valve: The aortic valve is normal in structure. Aortic valve regurgitation is not visualized. No aortic stenosis is present. Aortic valve mean gradient measures 7.0 mmHg. Aortic valve peak gradient measures 14.7 mmHg. Pulmonic Valve: The pulmonic valve was normal in structure. Pulmonic valve regurgitation is not visualized. No evidence of pulmonic stenosis. Aorta: The aortic root is normal in size and structure. Venous: The inferior vena cava is normal in size with greater than 50% respiratory variability, suggesting right atrial pressure of 3 mmHg. IAS/Shunts: No atrial level shunt detected by color flow Doppler.  LEFT VENTRICLE PLAX 2D LVIDd:         3.66 cm Diastology LVIDs:         2.60 cm LV e' medial:    9.03 cm/s LV PW:         1.34 cm LV E/e' medial:  7.6 LV IVS:        1.30 cm LV e' lateral:   11.30 cm/s                        LV E/e' lateral: 6.1  RIGHT VENTRICLE RV S prime:     13.75 cm/s TAPSE (M-mode): 3.7 cm LEFT ATRIUM           Index       RIGHT ATRIUM           Index LA diam:      4.00 cm 1.57 cm/m  RA Area:     13.10 cm LA Vol (A4C): 52.7 ml 20.71 ml/m RA Volume:   33.30 ml  13.09 ml/m  AORTIC VALVE AV Vmax:           191.50 cm/s AV Vmean:          121.000 cm/s  AV VTI:            0.317 m AV Peak Grad:      14.7 mmHg AV Mean Grad:      7.0 mmHg LVOT Vmax:         131.00 cm/s LVOT Vmean:        83.700 cm/s LVOT VTI:          0.249 m LVOT/AV VTI ratio: 0.79 MITRAL VALVE               TRICUSPID VALVE MV Area (PHT): 3.68 cm    TV Peak grad:   29.1  mmHg MV Decel Time: 206 msec    TV Vmax:        2.70 m/s MV E velocity: 68.40 cm/s MV A velocity: 98.50 cm/s  SHUNTS MV E/A ratio:  0.69        Systemic VTI: 0.25 m Ida Rogue MD Electronically signed by Ida Rogue MD Signature Date/Time: 11/20/2020/4:34:13 PM    Final    CT CHEST ABDOMEN PELVIS WO CONTRAST  Result Date: 10/23/2020 CLINICAL DATA:  Pt arrived ems from home. Pt was at home and collapsed and CPR was started. 18 mins of CPR. 3 epi, 2 bicarb, 1 calcium. ROSC achieved. While en route, pt lost pulses again but after 2 round of CPR ROSC was obtained again with history of diabetes, peripheral vascular disease with nonhealing ulcers on the right lower extremity, hypertension, congestive heart failure, BPH, history of DVT, history of COVID infection, dementia and chronic kidney disease stage IIIb with a baseline creatinine of about 2.4 as of 2 weeks ago. Patient sees Dr. Murlean Iba in the office. He was last seen in last month.As per the family while sitting in the car he became on responsive. The EMT came and did CPR. Patient was unresponsive for about 20 minutes finally he regained pulse. Patient is now intubated and sedated EXAM: CT CHEST, ABDOMEN AND PELVIS WITHOUT CONTRAST TECHNIQUE: Multidetector CT imaging of the chest, abdomen and pelvis was performed following the standard protocol without IV contrast. Examlimited by lack of intravenous and oral contrast and due to artifact from the arms being at patient's side. COMPARISON:  07/21/2020. FINDINGS: CT CHEST FINDINGS Cardiovascular: Heart is normal in size and configuration. No pericardial effusion. Great vessels normal in caliber. Mild aortic  atherosclerosis. Mediastinum/Nodes: Endotracheal tube tip projects at the upper level of the carina. Nasal/orogastric tube passes below the diaphragm, well within the stomach. No neck base, axillary, mediastinal or hilar masses or enlarged lymph nodes. Trachea and esophagus are grossly unremarkable. Lungs/Pleura: Small pleural effusions. Bilateral patchy areas of ground-glass and more confluent opacity, most evident in the right upper lobe. Additional opacity is noted dependently in the lower lobes consistent with atelectasis. Mild interstitial thickening bilaterally. No mass. No suspicious nodule. No pneumothorax. Musculoskeletal: Subtle nondisplaced fractures of the anterior right 7 anterior left sixth ribs. No other fractures. No bone lesions. CT ABDOMEN PELVIS FINDINGS Hepatobiliary: Limited assessment of the from the liver and gallbladder due to artifact arms. Liver normal in size. No convincing mass. Gallbladder unremarkable. No evidence duct dilation. Pancreas: Assessment also somewhat limited by artifact from the arms. No mass or inflammation. Spleen: Normal in size without focal abnormality. Adrenals/Urinary Tract: No adrenal masses. Kidneys normal in orientation and position. Areas of renal cortical thinning, likely scarring. No convincing renal mass, no stone and no hydronephrosis. Ureters not well visualized, but no evidence of a ureteral stone or ureteral dilation. Bladder is decompressed with a Foley catheter. Stomach/Bowel: Stomach is unremarkable. Small bowel and colon are normal in caliber. No wall thickening. No inflammation. Scattered left colon diverticula. Mild increase in the colonic stool burden. No evidence of appendicitis. Vascular/Lymphatic: Stable vena cava filter. Single tiny focus of aortic atherosclerotic calcification. No aneurysm. No enlarged lymph nodes. Reproductive: Unremarkable. Other: No abdominal wall hernia.  No ascites. Musculoskeletal: No fracture or acute finding.  No bone  lesion. IMPRESSION: CT CHEST 1. Patchy bilateral ground-glass and confluent airspace opacities, most evident in the right upper lobe, which could be the result CPR, but is more suggestive of multifocal pneumonia. 2. Nondisplaced  fractures of the anterior right seventh and anterior left sixth ribs presumably occurring during CPR. 3. Dependent opacity in both lower lobes consistent with atelectasis. Small effusions. ABDOMEN AND PELVIS CT 1. No acute findings within the abdomen or pelvis. Electronically Signed   By: Lajean Manes M.D.   On: 11/19/2020 14:30     Microbiology Recent Results (from the past 240 hour(s))  Resp Panel by RT-PCR (Flu A&B, Covid) Nasopharyngeal Swab     Status: None   Collection Time: 11/09/2020 12:07 PM   Specimen: Nasopharyngeal Swab; Nasopharyngeal(NP) swabs in vial transport medium  Result Value Ref Range Status   SARS Coronavirus 2 by RT PCR NEGATIVE NEGATIVE Final    Comment: (NOTE) SARS-CoV-2 target nucleic acids are NOT DETECTED.  The SARS-CoV-2 RNA is generally detectable in upper respiratory specimens during the acute phase of infection. The lowest concentration of SARS-CoV-2 viral copies this assay can detect is 138 copies/mL. A negative result does not preclude SARS-Cov-2 infection and should not be used as the sole basis for treatment or other patient management decisions. A negative result may occur with  improper specimen collection/handling, submission of specimen other than nasopharyngeal swab, presence of viral mutation(s) within the areas targeted by this assay, and inadequate number of viral copies(<138 copies/mL). A negative result must be combined with clinical observations, patient history, and epidemiological information. The expected result is Negative.  Fact Sheet for Patients:  EntrepreneurPulse.com.au  Fact Sheet for Healthcare Providers:  IncredibleEmployment.be  This test is no t yet approved or  cleared by the [place] FDA and  has been authorized for detection and/or diagnosis of SARS-CoV-2 by FDA under an Emergency Use Authorization (EUA). This EUA will remain  in effect (meaning this test can be used) for the duration of the COVID-19 declaration under Section 564(b)(1) of the Act, 21 U.S.C.section 360bbb-3(b)(1), unless the authorization is terminated  or revoked sooner.       Influenza A by PCR NEGATIVE NEGATIVE Final   Influenza B by PCR NEGATIVE NEGATIVE Final    Comment: (NOTE) The Xpert Xpress SARS-CoV-2/FLU/RSV plus assay is intended as an aid in the diagnosis of influenza from Nasopharyngeal swab specimens and should not be used as a sole basis for treatment. Nasal washings and aspirates are unacceptable for Xpert Xpress SARS-CoV-2/FLU/RSV testing.  Fact Sheet for Patients: EntrepreneurPulse.com.au  Fact Sheet for Healthcare Providers: IncredibleEmployment.be  This test is not yet approved or cleared by the [place] FDA and has been authorized for detection and/or diagnosis of SARS-CoV-2 by FDA under an Emergency Use Authorization (EUA). This EUA will remain in effect (meaning this test can be used) for the duration of the COVID-19 declaration under Section 564(b)(1) of the Act, 21 U.S.C. section 360bbb-3(b)(1), unless the authorization is terminated or revoked.  Performed at [facility], [address] [place], [place] [address]   CULTURE, BLOOD (ROUTINE X 2) w Reflex to ID Panel     Status: None (Preliminary result)   Collection Time: 11/12/2020 12:41 PM   Specimen: BLOOD  Result Value Ref Range Status   Specimen Description BLOOD LEFT ASSIST CONTROL  Final   Special Requests   Final    BOTTLES DRAWN AEROBIC AND ANAEROBIC Blood Culture adequate volume   Culture   Final    NO GROWTH 3 DAYS Performed at [facility], [address] [place], [place] [address]    Report Status PENDING   Incomplete  CULTURE, BLOOD (ROUTINE X 2) w Reflex to ID Panel  Status: None (Preliminary result)   Collection Time: 11/01/2020 12:42 PM   Specimen: BLOOD  Result Value Ref Range Status   Specimen Description BLOOD RIGHT ASSIST CONTROL  Final   Special Requests   Final    BOTTLES DRAWN AEROBIC AND ANAEROBIC Blood Culture adequate volume   Culture   Final    NO GROWTH 3 DAYS Performed at [facility], [address], [place], [place] [address]    Report Status PENDING  Incomplete  Urine culture     Status: None   Collection Time: 11/02/2020  1:36 PM   Specimen: Urine, Random  Result Value Ref Range Status   Specimen Description   Final    URINE, RANDOM Performed at [facility], [address] [place], [place] [address]    Special Requests   Final    NONE Performed at [facility], [address], [place], [place] [address]    Culture   Final    NO GROWTH Performed at [facility], [address], [place], [place] [address]    Report Status 11/20/2020 FINAL  Final  MRSA PCR Screening     Status: None   Collection Time: 10/31/2020  3:55 PM   Specimen: Nasal Mucosa; Nasopharyngeal  Result Value Ref Range Status   MRSA by PCR NEGATIVE NEGATIVE Final    Comment:        The GeneXpert MRSA Assay (FDA approved for NASAL specimens only), is one component of a comprehensive MRSA colonization surveillance program. It is not intended to diagnose MRSA infection nor to guide or monitor treatment for MRSA infections. Performed at [facility], [address] [place], [place] [address]   Culture, respiratory (tracheal aspirate)     Status: None   Collection Time: 10/30/2020  4:30 PM   Specimen: Tracheal Aspirate; Respiratory  Result Value Ref Range Status   Specimen Description   Final    TRACHEAL ASPIRATE Performed at [facility], [address] [place], [place] [address]    Special Requests   Final    NONE Performed at  [facility], [address] [place], [place] [address]    Gram Stain   Final    MODERATE WBC PRESENT, PREDOMINANTLY PMN NO ORGANISMS SEEN    Culture   Final    Normal respiratory flora-no Staph aureus or Pseudomonas seen Performed at [facility] [address], [place], [place] [address]    Report Status 11/20/2020 FINAL  Final  Respiratory (~20 pathogens) panel by PCR     Status: None   Collection Time: 11/04/2020  4:30 PM  Result Value Ref Range Status   Adenovirus NOT DETECTED NOT DETECTED Final   Coronavirus 229E NOT DETECTED NOT DETECTED Final    Comment: (NOTE) The Coronavirus on the Respiratory Panel, DOES NOT test for the novel  Coronavirus (2019 nCoV)    Coronavirus HKU1 NOT DETECTED NOT DETECTED Final   Coronavirus NL63 NOT DETECTED NOT DETECTED Final   Coronavirus OC43 NOT DETECTED NOT DETECTED Final   Metapneumovirus NOT DETECTED NOT DETECTED Final   Rhinovirus / Enterovirus NOT DETECTED NOT DETECTED Final   Influenza A NOT DETECTED NOT DETECTED Final   Influenza B NOT DETECTED NOT DETECTED Final   Parainfluenza Virus 1 NOT DETECTED NOT DETECTED Final   Parainfluenza Virus 2 NOT DETECTED NOT DETECTED Final   Parainfluenza Virus 3 NOT DETECTED NOT DETECTED Final   Parainfluenza Virus 4 NOT DETECTED NOT DETECTED Final  Respiratory Syncytial Virus NOT DETECTED NOT DETECTED Final   Bordetella pertussis NOT DETECTED NOT DETECTED Final   Bordetella Parapertussis NOT DETECTED NOT DETECTED Final   Chlamydophila pneumoniae NOT DETECTED NOT DETECTED Final   Mycoplasma pneumoniae NOT DETECTED NOT DETECTED Final    Comment: Performed at [facility], [place] [address] [place], [place] [address]    Lab Basic Metabolic Panel: Recent Labs  Lab 10/24/2020 2032 11/19/20 0459 11/19/20 1647 11/20/20 0428 11/21/20 0346 11/21/20 1648 12-01-2020 0438  NA 144 145 145 147* 149*  --  150*  K 5.6* 5.2* 4.5 4.1 4.0  --  4.1  CL 112* 115* 112* 116* 119*  --   118*  CO2 22 22 24 22 22   --  23  GLUCOSE 144* 123* 100* 86 77  --  144*  BUN 106* 112* 105* 98* 92*  --  91*  CREATININE 3.42* 3.60* 3.75* 3.83* 3.88*  --  3.84*  CALCIUM 8.3* 8.0* 8.1* 8.1* 8.3*  --  8.6*  MG 2.2 2.4  --  1.9  --  2.3 2.3  PHOS 5.4* 5.9*  --  4.6  --  4.7* 4.6   Liver Function Tests: Recent Labs  Lab 11/11/2020 1207  AST 179*  ALT 159*  ALKPHOS 108  BILITOT 0.5  PROT 5.6*  ALBUMIN 2.8*   No results for input(s): LIPASE, AMYLASE in the last 168 hours. No results for input(s): AMMONIA in the last 168 hours. CBC: Recent Labs  Lab 10/22/2020 1336 11/04/2020 1812 11/19/20 0459 11/19/20 1647 11/20/20 0428 11/20/20 1105 11/21/20 0934 11/21/20 1648 11/21/20 2201 12/01/2020 0438 12/01/2020 0956 12-01-20 1636  WBC 13.5*  --  11.4*  --  11.0*  --  10.0  --   --  10.0  --   --   NEUTROABS 12.0*  --   --   --   --   --  8.3*  --   --  8.3*  --   --   HGB 2.5*   < > 6.5*   < > 7.3*   < > 7.0* 7.7* 7.4* 7.3* 7.2* 7.3*  HCT 7.9*   < > 18.9*  --  21.9*   < > 20.9* 24.0* 23.1* 22.7* 23.1* 23.1*  MCV 68.1*  --  80.1  --  80.5  --  82.3  --   --  84.4  --   --   PLT 151  --  108*  --  92*  --  78*  --   --  85*  --   --    < > = values in this interval not displayed.   Cardiac Enzymes: No results for input(s): CKTOTAL, CKMB, CKMBINDEX, TROPONINI in the last 168 hours. Sepsis Labs: Recent Labs  Lab 11/11/2020 1207 11/03/2020 1241 11/15/2020 1600 11/19/20 0459 11/20/20 0428 11/21/20 0934 12-01-20 0438  WBC  --    < >  --  11.4* 11.0* 10.0 10.0  LATICACIDVEN 6.6*  --  5.1*  --   --   --   --    < > = values in this interval not displayed.    Procedures/Operations      Rufina Falco, DNP, CCRN, FNP-C, AGACNP-BC Acute Care Nurse Practitioner  [facility] Pager: [phone] [place] [facility]

## 2020-12-21 NOTE — Progress Notes (Signed)
NAME:  Danner Paulding., MRN:  [id number], DOB:  January 29, 1941, LOS: 27 ADMISSION DATE:  10/23/2020 80 year old male with a history of CKD 4, chronic venous insufficiency, DVT on Xarelto, peripheral artery disease, CHF seen by [facility] last transthoracic echo done December 2021, morbid obesity, obstructive sleep apnea on CPAP device at home, type 2 diabetes, Lewy body dementia with parkinsonism, chronic anemia history of total knee replacement, seizure disorder came in status post cardiac arrest. Received ACLS 20 minutes with ROSC. Lost pulse again and required ACLS second time followed by ROSC required endotracheal intubation in the ER. Medical intensive care unit admission post cardiac arrest.   Evaluated patient with nephrologist together at the bedside. They deny flulike illness malaise, myalgia he did previously have COVID-19 and recovered to baseline. Additional review of systems reveals decreased appetite over 48 hours. Denies diarrhea nausea vomiting or any additional symptoms.  Labs show chronic anemia, CKD metabolic acidosis with high anion gap. Vitals without hypotension or shock. On exam GCS 4 T with plan to perform CT head to rule out intracerebral hemorrhage followed by [facility] cooling protocol post cardiac arrest.            Antibiotics Given (last 72 hours)    Date/Time Action Medication Dose Rate   11/01/2020 1255 New Bag/Given   piperacillin-tazobactam (ZOSYN) IVPB 3.375 g 3.375 g 100 mL/hr   11/13/2020 1631 New Bag/Given   vancomycin (VANCOREADY) IVPB 2000 mg/400 mL 2,000 mg 200 mL/hr   11/16/2020 2232 New Bag/Given   piperacillin-tazobactam (ZOSYN) IVPB 3.375 g 3.375 g 12.5 mL/hr   11/19/20 0509 New Bag/Given   piperacillin-tazobactam (ZOSYN) IVPB 3.375 g 3.375 g 12.5 mL/hr      Significant Hospital Events: Including procedures, antibiotic start and stop dates in addition to other pertinent events   11/19/20- patient finishing [facility] protocol. He  received several bags of PRBC. Heme/onc workup is in process. Met with family and reviewed care plan.  11/20/20-patient is on MV post ACLS now rewarmed post TTM with sedation he is reacting to physical stimuli.  6/1 remains with mulitorgan failure 6/2 MRI no acute injury    Interim History / Subjective:  Remains on vent Off sedation last 24 hrs No purposeful movements Remains critically ill Still showing signs of brain injury despite MRI report     Objective   Blood pressure 123/64, pulse 79, temperature (!) 97.16 F (36.2 C), resp. rate 14, height 5' 10.5" (1.791 m), weight (!) 138.6 kg, SpO2 100 %.    Vent Mode: PRVC FiO2 (%):  [28 %] 28 % Set Rate:  [16 bmp] 16 bmp Vt Set:  [500 mL] 500 mL PEEP:  [5 cmH20] 5 cmH20 Plateau Pressure:  [0 cmH20] 0 cmH20   Intake/Output Summary (Last 24 hours) at December 05, 2020 0731 Last data filed at 12-05-2020 0603 Gross per 24 hour  Intake 1538.01 ml  Output 1025 ml  Net 513.01 ml   Filed Weights   11/20/20 0444 11/21/20 0355 05-Dec-2020 0437  Weight: (!) 141.7 kg (!) 144.1 kg (!) 138.6 kg      REVIEW OF SYSTEMS  PATIENT IS UNABLE TO PROVIDE COMPLETE REVIEW OF SYSTEMS DUE TO SEVERE CRITICAL ILLNESS AND TOXIC METABOLIC ENCEPHALOPATHY   PHYSICAL EXAMINATION:  GENERAL:critically ill appearing, +resp distress HEAD: Normocephalic, atraumatic.  EYES: Pupils equal, round, reactive to light.  No scleral icterus.  MOUTH: Moist mucosal membrane. NECK: Supple. PULMONARY: +rhonchi, +wheezing CARDIOVASCULAR: S1 and S2. Regular rate and rhythm. No murmurs, rubs, or gallops.  GASTROINTESTINAL: Soft, nontender, -distended. Positive bowel sounds.  MUSCULOSKELETAL: No swelling, clubbing, or edema.  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 s/p hypothermia protocol  Severe ACUTE Hypoxic and Hypercapnic Respiratory Failure -continue Mechanical Ventilator support -continue Bronchodilator Therapy -Wean Fio2 and PEEP as tolerated -VAP/VENT bundle implementation Unable to wean from vent    CARDIAC FAILURE-acute diastolic dysfunction Acute cardiac ischemic cardiomyopathy -oxygen as needed -Lasix as tolerated -follow up cardiac enzymes as indicated   CARDIAC ICU monitoring   ACUTE KIDNEY INJURY/Renal Failure -continue Foley Catheter-assess need -Avoid nephrotoxic agents -Follow urine output, BMP -Ensure adequate renal perfusion, optimize oxygenation -Renal dose medications   NEUROLOGY Acute toxic metabolic encephalopathy Off sedation Signs and symptoms of brain injury despitee MRI reports Follow neurology consultation   ENDO - ICU hypoglycemic\Hyperglycemia protocol -check FSBS per protocol   GI GI PROPHYLAXIS as indicated  NUTRITIONAL STATUS DIET-->TF's as tolerated Constipation protocol as indicated   ELECTROLYTES -follow labs as needed -replace as needed -pharmacy consultation and following   Best practice (right click and "Reselect all SmartList Selections" daily)  Pain/Anxiety/Delirium protocol (if indicated): Yes (RASS goal 0) VAP protocol (if indicated): Yes DVT prophylaxis: LMWH GI prophylaxis: H2B Glucose control:  SSI Yes Central venous access:  Yes, and it is still needed Arterial line:  N/A Foley:  N/A Mobility:  bed rest  PT consulted: N/A Code Status:  full code Disposition: DNR     Labs   CBC: Recent Labs  Lab 10/26/2020 1336 10/27/2020 1812 11/19/20 0459 11/19/20 1647 11/20/20 0428 11/20/20 1105 11/21/20 0315 11/21/20 0934 11/21/20 1648 11/21/20 2201 December 10, 2020 0438  WBC 13.5*  --  11.4*  --  11.0*  --   --  10.0  --   --  10.0  NEUTROABS 12.0*  --   --   --   --   --    --  8.3*  --   --  8.3*  HGB 2.5*   < > 6.5*   < > 7.3*   < > 7.0* 7.0* 7.7* 7.4* 7.3*  HCT 7.9*   < > 18.9*  --  21.9*   < > 21.6* 20.9* 24.0* 23.1* 22.7*  MCV 68.1*  --  80.1  --  80.5  --   --  82.3  --   --  84.4  PLT 151  --  108*  --  92*  --   --  78*  --   --  85*   < > = values in this interval not displayed.    Basic Metabolic Panel: Recent Labs  Lab 11/06/2020 2032 11/19/20 0459 11/19/20 1647 11/20/20 0428 11/21/20 0346 11/21/20 1648 December 10, 2020 0438  NA 144 145 145 147* 149*  --  150*  K 5.6* 5.2* 4.5 4.1 4.0  --  4.1  CL 112* 115* 112* 116* 119*  --  118*  CO2 22 22 24 22 22   --  23  GLUCOSE 144* 123* 100* 86 77  --  144*  BUN 106* 112* 105* 98* 92*  --  91*  CREATININE 3.42* 3.60* 3.75* 3.83* 3.88*  --  3.84*  CALCIUM 8.3* 8.0* 8.1* 8.1* 8.3*  --  8.6*  MG 2.2 2.4  --  1.9  --  2.3 2.3  PHOS 5.4* 5.9*  --  4.6  --  4.7* 4.6   GFR: Estimated Creatinine Clearance: 22.4 mL/min (A) (by C-G formula based on SCr of 3.84 mg/dL (H)). Recent Labs  Lab 11/11/2020 1207 10/21/2020 1241 11/17/2020 1600 11/19/20 0459 11/20/20 0428 11/21/20 0934 12/09/2020 0438  WBC  --    < >  --  11.4* 11.0* 10.0 10.0  LATICACIDVEN 6.6*  --  5.1*  --   --   --   --    < > = values in this interval not displayed.    Liver Function Tests: Recent Labs  Lab 10/23/2020 1207  AST 179*  ALT 159*  ALKPHOS 108  BILITOT 0.5  PROT 5.6*  ALBUMIN 2.8*   No results for input(s): LIPASE, AMYLASE in the last 168 hours. No results for input(s): AMMONIA in the last 168 hours.  ABG    Component Value Date/Time   PHART 7.38 11/19/2020 0422   PCO2ART 36 11/19/2020 0422   PO2ART 108 11/19/2020 0422   HCO3 22.0 11/19/2020 0422   ACIDBASEDEF 3.0 (H) 11/19/2020 0422   O2SAT 98.3 11/19/2020 0422     Coagulation Profile: Recent Labs  Lab 11/08/2020 2032  INR 2.4*    Cardiac Enzymes: No results for input(s): CKTOTAL, CKMB, CKMBINDEX, TROPONINI in the last 168 hours.  HbA1C: Hemoglobin A1C   Date/Time Value Ref Range Status  08/22/2020 10:12 AM 5.5 4.0 - 5.6 % Final  03/13/2020 12:00 AM 5.5  Final   Hgb A1c MFr Bld  Date/Time Value Ref Range Status  06/05/2020 08:16 AM 5.9 (H) 4.8 - 5.6 % Final    Comment:    (NOTE) Pre diabetes:          5.7%-6.4%  Diabetes:              >6.4%  Glycemic control for   <7.0% adults with diabetes   10/28/2019 09:59 AM 6.0 (H) 4.8 - 5.6 % Final    Comment:    (NOTE) Pre diabetes:          5.7%-6.4% Diabetes:              >6.4% Glycemic control for   <7.0% adults with diabetes     CBG: Recent Labs  Lab 11/21/20 1540 11/21/20 1930 11/21/20 2304 12/09/2020 0318 12-09-20 0725  GLUCAP 103* 137* 131* 134* 155*    Allergies Allergies  Allergen Reactions  . Shellfish Allergy Anaphylaxis and Other (See Comments)    gout  . Iodinated Diagnostic Agents Other (See Comments)    Chronic kidney disease 3b  . Nsaids Other (See Comments)    Chronic kidney disease 3b  . Rivastigmine Other (See Comments)    Side effect and not a true allergy       DVT/GI PRX  assessed I Assessed the need for Labs I Assessed the need for Foley I Assessed the need for Central Venous Line Family Discussion when available I Assessed the need for Mobilization I made an Assessment of medications to be adjusted accordingly Safety Risk assessment completed  CASE DISCUSSED IN MULTIDISCIPLINARY ROUNDS WITH ICU TEAM     Critical Care Time devoted to patient care services described in this note is 56 minutes.  Critical care was necessary to treat or prevent imminent or life-threatening deterioration. Overall, patient is critically ill, prognosis is guarded.  Patient with Multiorgan failure and at high risk for cardiac arrest and death.    Corrin Parker, M.D.  [name]  Medical Director [place] Director [facility] Department

## 2020-12-21 NOTE — Progress Notes (Signed)
Patient extubated to room air without incident. Morphine drip currently infusing @ 41ml/hr. Family at the bedside

## 2020-12-21 NOTE — Progress Notes (Signed)
[employer].   DOB:07-28-1940   OI#:[id number]    Subjective: Patient continues to be intubated/sedated.  No significant/meaningful neurologic recovery noted off sedation.  Family by the bedside.  No GI bleed noted.  Objective:  Vitals:   2020/12/07 2000 12/07/20 2100  BP: 100/61   Pulse: 75 (!) 105  Resp:    Temp:    SpO2: 100% (!) 60%     Intake/Output Summary (Last 24 hours) at 12/07/20 2149 Last data filed at Dec 07, 2020 2100 Gross per 24 hour  Intake 1691.22 ml  Output 1525 ml  Net 166.22 ml    Physical Exam Constitutional:      Comments: Patient is intubated/sedated.  HENT:     Head: Normocephalic and atraumatic.     Mouth/Throat:     Pharynx: No oropharyngeal exudate.  Eyes:     Pupils: Pupils are equal, round, and reactive to light.  Cardiovascular:     Rate and Rhythm: Normal rate and regular rhythm.  Pulmonary:     Effort: No respiratory distress.     Breath sounds: No wheezing.     Comments: Decreased breath bilaterally. Abdominal:     General: Bowel sounds are normal. There is no distension.     Palpations: Abdomen is soft. There is no mass.     Tenderness: There is no abdominal tenderness. There is no guarding or rebound.  Musculoskeletal:        General: No tenderness. Normal range of motion.     Cervical back: Normal range of motion and neck supple.  Skin:    General: Skin is warm.  Psychiatric:        Mood and Affect: Affect normal.      Labs:  Lab Results  Component Value Date   WBC 10.0 12/07/2020   HGB 7.3 (L) Dec 07, 2020   HCT 23.1 (L) 12/07/2020   MCV 84.4 December 07, 2020   PLT 85 (L) 12/07/2020   NEUTROABS 8.3 (H) 07-Dec-2020    Lab Results  Component Value Date   NA 150 (H) 2020/12/07   K 4.1 Dec 07, 2020   CL 118 (H) 12-07-20   CO2 23 2020/12/07    Studies:  DG Abd 1 View  Result Date: 11/21/2020 CLINICAL DATA:  Orogastric placement EXAM: ABDOMEN - 1 VIEW COMPARISON:  None FINDINGS: Orogastric tube enters the stomach with its tip  in the fundus. Small amount of contrast injected confirming gastric positioning. Contrast leave the stomach and is seen in the proximal jejunum. IVC filter incidentally noted. IMPRESSION: Orogastric tube tip in the fundus of the stomach. Electronically Signed   By: Nelson Chimes M.D.   On: 11/21/2020 16:13   MR BRAIN WO CONTRAST  Result Date: 11/21/2020 CLINICAL DATA:  Cardiac arrest post hypothermia protocol EXAM: MRI HEAD WITHOUT CONTRAST TECHNIQUE: Multiplanar, multiecho pulse sequences of the brain and surrounding structures were obtained without intravenous contrast. COMPARISON:  June 05, 2020 FINDINGS: Brain: There is no acute infarction or intracranial hemorrhage. There is no intracranial mass, mass effect, or edema. There is no hydrocephalus or extra-axial fluid collection. Prominence of the ventricles and sulci reflects mild generalized parenchymal volume loss. Patchy and confluent areas of T2 hyperintensity in the supratentorial white matter are nonspecific but probably reflect mild to moderate chronic microvascular ischemic changes. Small chronic bilateral cerebellar infarcts. Vascular: Major vessel flow voids at the skull base are preserved. Skull and upper cervical spine: Normal marrow signal is preserved. Sinuses/Orbits: Paranasal sinuses are aerated. Orbits are unremarkable. Other: Sella is unremarkable. Mastoid air  cells are clear. Retained secretions within the included pharynx. IMPRESSION: No evidence of acute hypoxic/ischemic injury. Mild to moderate chronic microvascular ischemic changes. Small chronic cerebellar infarcts. Electronically Signed   By: Macy Mis M.D.   On: 11/21/2020 14:37    Severe anemia #80 year old male patient with multiple comorbidities-including CKD/CHF-is currently admitted to hospital-cardiac arrest s/p CPR noted to have severe anemia  # Severe anemia hemoglobin 2.5 [on admission]-s/p 6 units of PRBC; hemoglobin 7-8.  Etiology is unclear however appears  acute on chronic iron deficiency.  No obvious GI losses noted; CT scan negative for any intra-abdominal bleed.  Hemolysis work-up negative.  Recommend IV iron.  #CKD stage IV; Lewy body dementia -Parkinson's /CHF/cardiac arrest status post CPR; s/p intubation-sedated/pressors  #Discussed with family: Had a long discussion with family/daughter-reviewed the labs extensively.  Suspect severe anemia had a role in patient's decompensation/precipitating an acute neurologic/cardiac event.  However, unfortunately in spite of correcting the anemia-patient's neurologic status has not improved which unfortunately is grave prognostic sign.  Reviewed the neurology impression/recommendation with the family.  Discussed with Dr. Mortimer Fries.   Cammie Sickle, MD 11/24/20  9:49 PM

## 2020-12-21 DEATH — deceased

## 2020-12-28 ENCOUNTER — Ambulatory Visit: Payer: Medicare Other | Admitting: Cardiology

## 2021-01-11 LAB — AFB ORGANISM ID BY DNA PROBE
M avium complex: NEGATIVE
M gordonae: POSITIVE — AB
M tuberculosis complex: NEGATIVE

## 2021-01-11 LAB — ACID FAST CULTURE WITH REFLEXED SENSITIVITIES (MYCOBACTERIA): Acid Fast Culture: POSITIVE — AB

## 2021-01-17 ENCOUNTER — Ambulatory Visit: Payer: Medicare Other | Admitting: Podiatry

## 2021-03-11 ENCOUNTER — Ambulatory Visit (INDEPENDENT_AMBULATORY_CARE_PROVIDER_SITE_OTHER): Payer: Medicare Other | Admitting: Nurse Practitioner

## 2021-04-19 ENCOUNTER — Encounter: Payer: Self-pay | Admitting: Pulmonary Disease

## 2022-04-23 LAB — HISTOPLASMA ANTIGEN, URINE: Histoplasma Antigen, urine: POSITIVE — AB (ref <0.5)

## 2022-06-02 IMAGING — CR DG CHEST 2V
1 series · 2 of 2 positions shown · non-contrast
Comparison: May 02, 2020.

CLINICAL DATA: Shortness of breath.

EXAM:
CHEST - 2 VIEW

[Series 1: dg chest 2 view · 0.14mm/px · 2 of 2 slices shown]
[im 1/2]
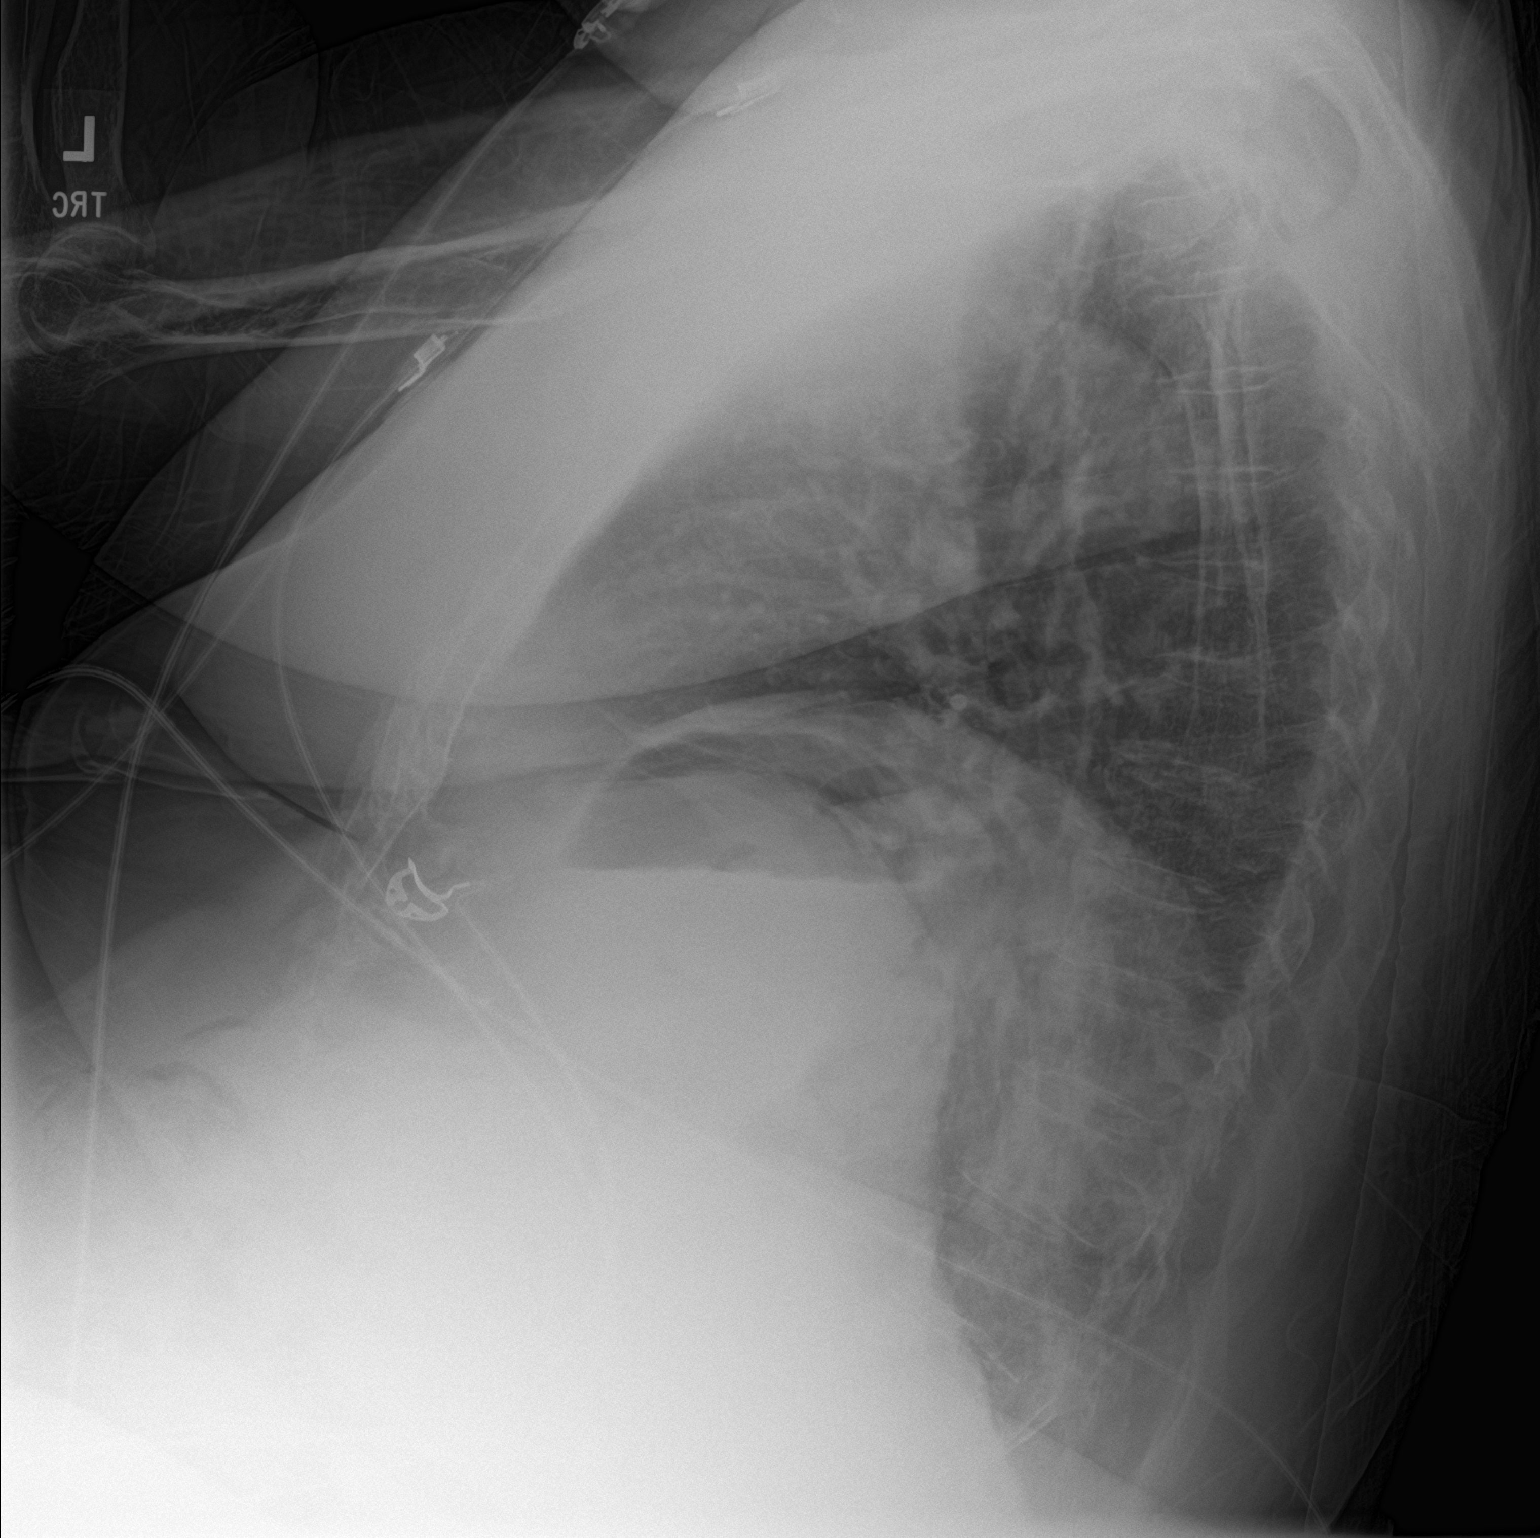
[im 2/2]
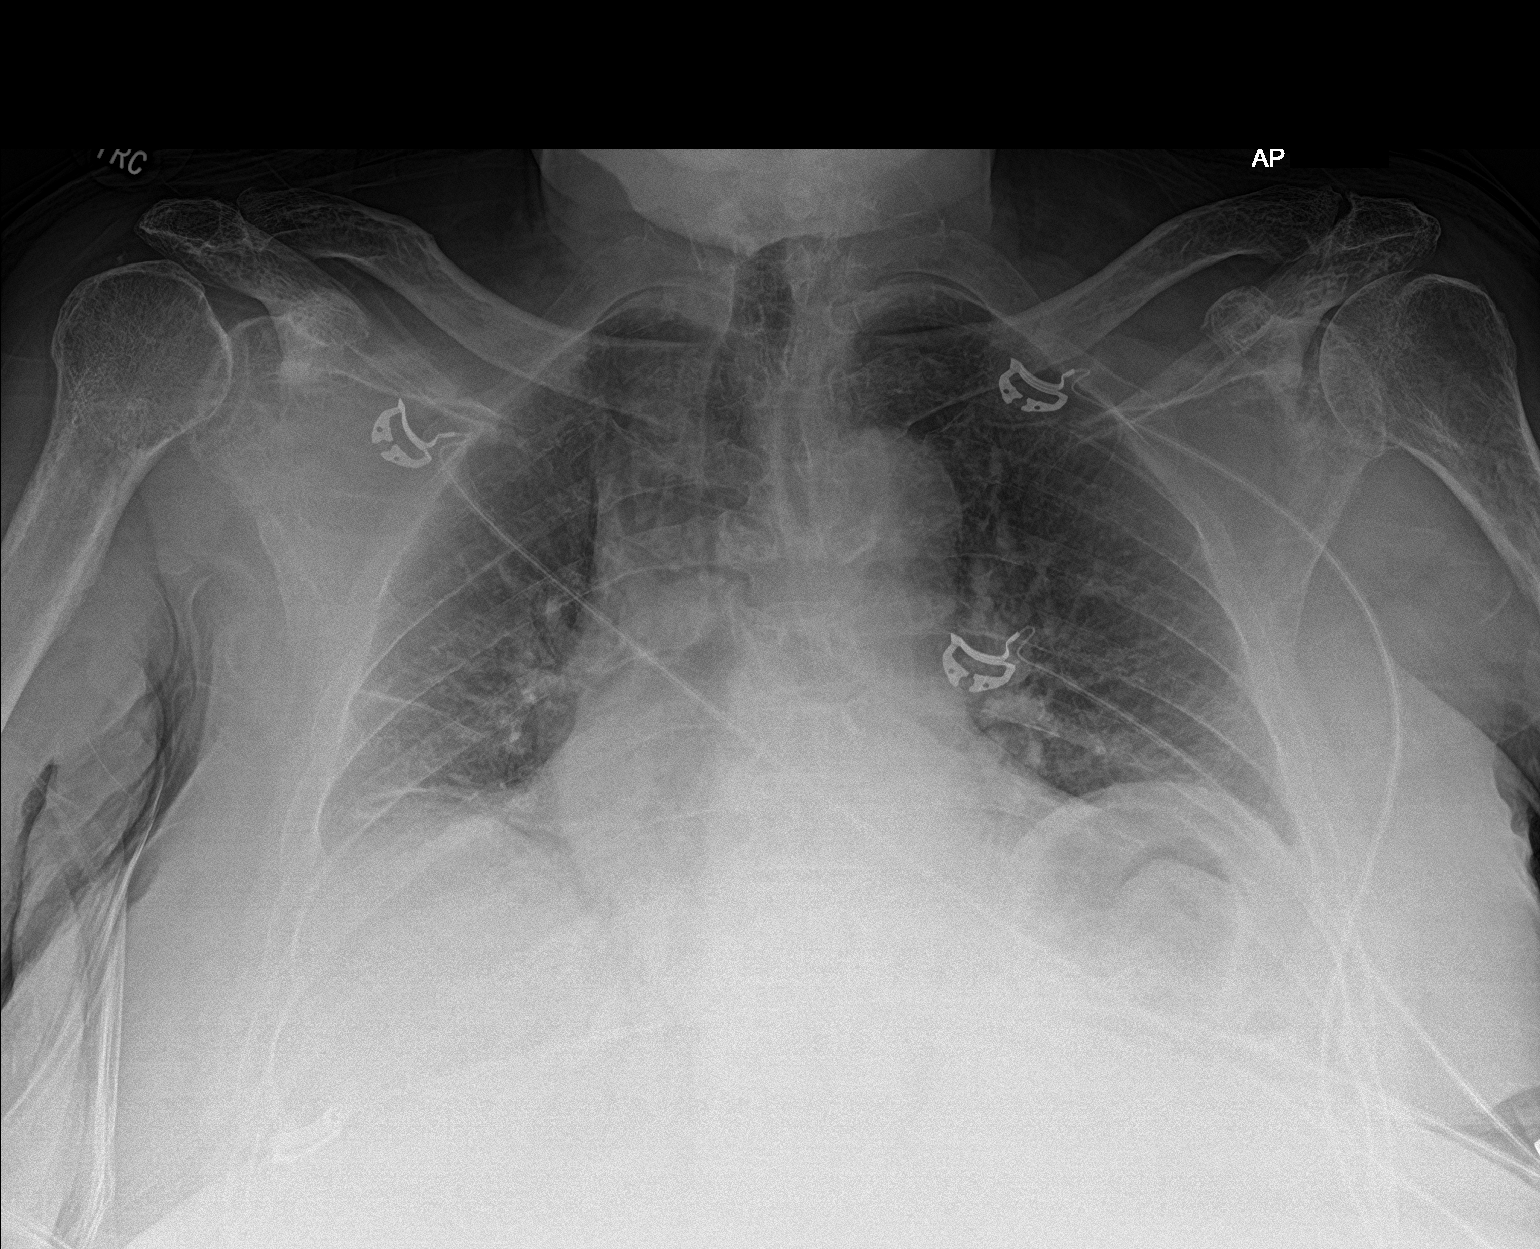

[2 of 2 positions shown; findings below may reference images not displayed]

FINDINGS: Stable cardiomediastinal silhouette. Hypoinflation of the lungs is
noted with mild bibasilar subsegmental atelectasis. No pneumothorax
or pleural effusion is noted. Bony thorax is unremarkable.
IMPRESSION: Hypoinflation of the lungs with mild bibasilar subsegmental
atelectasis.

## 2022-06-03 IMAGING — US US CAROTID DUPLEX BILAT
1 series · 13 of 24 positions shown · non-contrast
Comparison: None.

CLINICAL DATA: TIA.

EXAM:
BILATERAL CAROTID DUPLEX ULTRASOUND
TECHNIQUE: Gray scale imaging, color Doppler and duplex ultrasound were
performed of bilateral carotid and vertebral arteries in the neck.

[Series 1: us carotid bilateral · 13 of 66 slices shown]
[im 1/66]
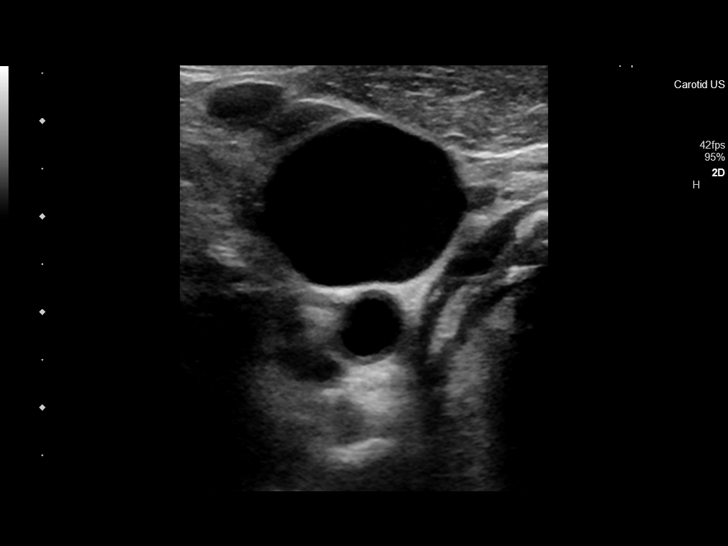
[im 6/66]
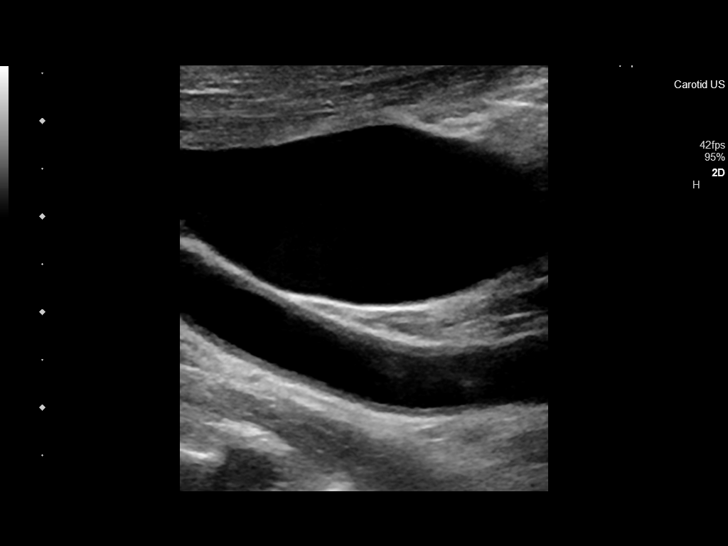
[im 12/66]
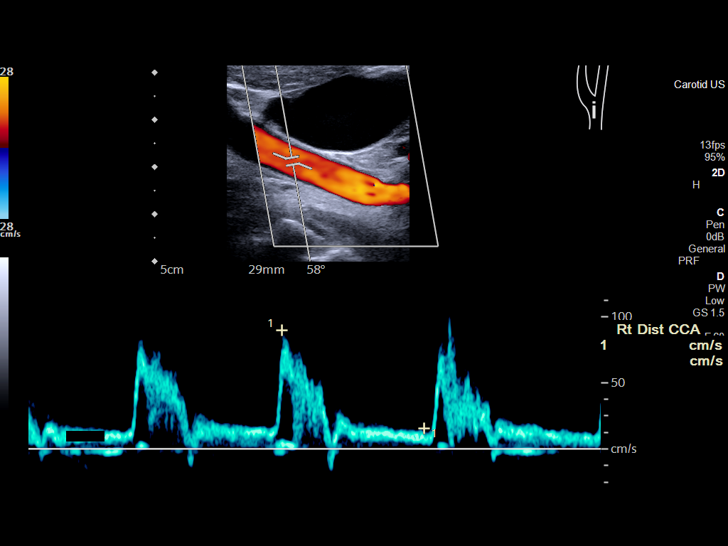
[im 17/66]
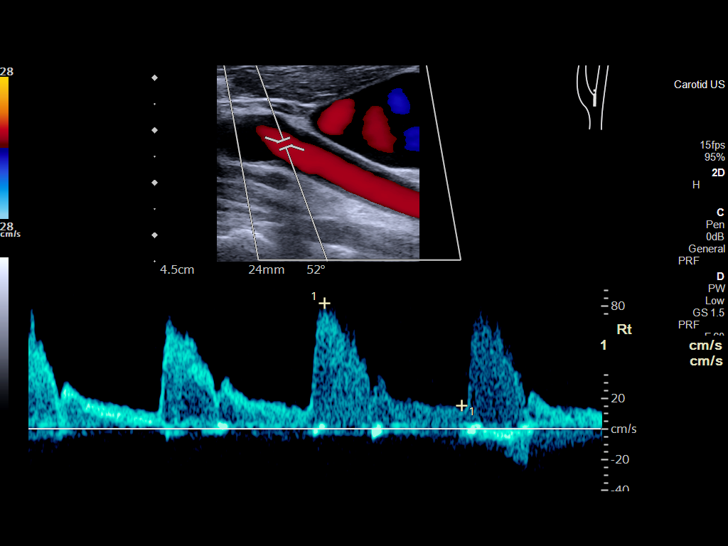
[im 23/66]
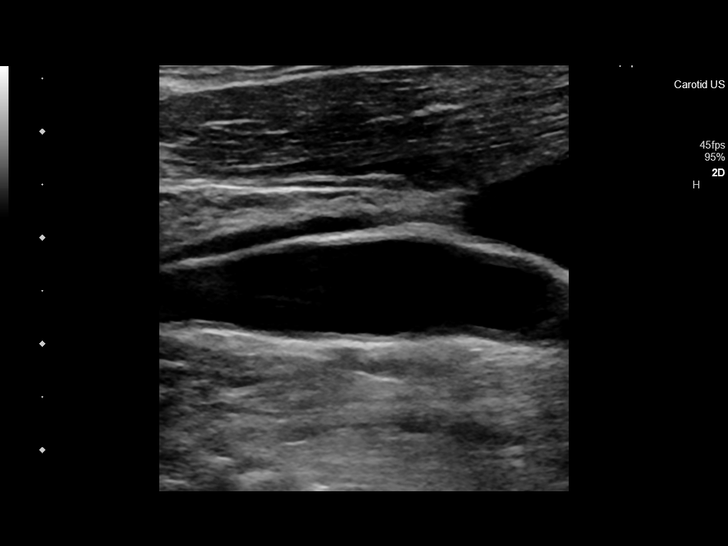
[im 29/66]
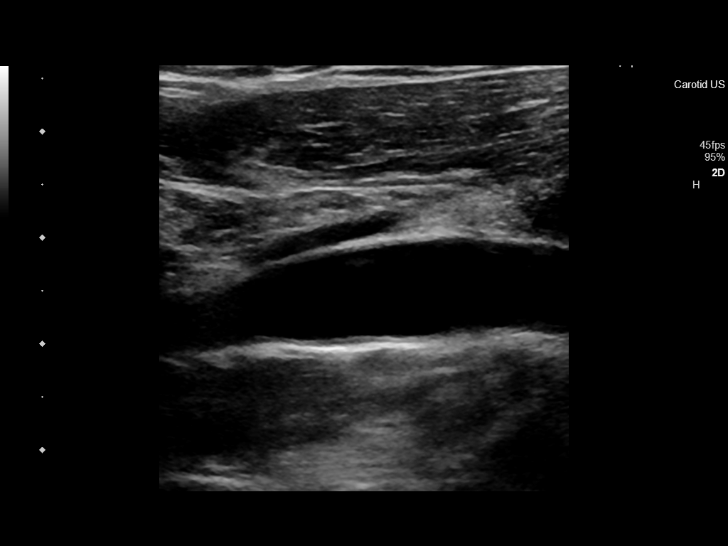
[im 34/66]
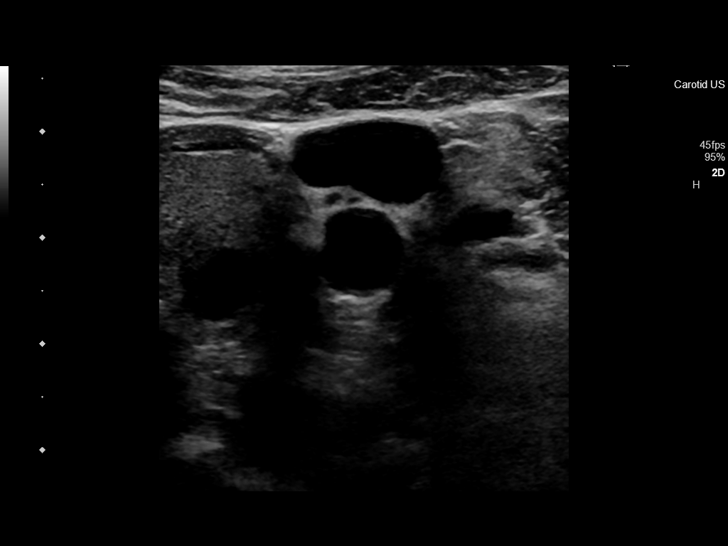
[im 37/66]
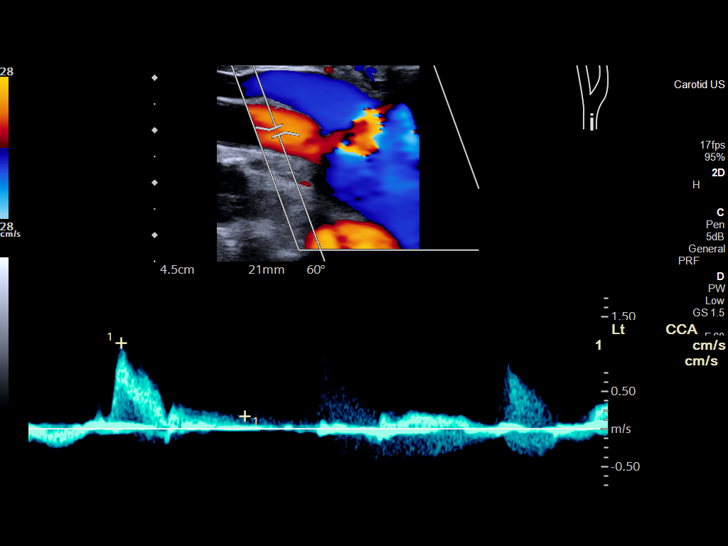
[im 43/66]
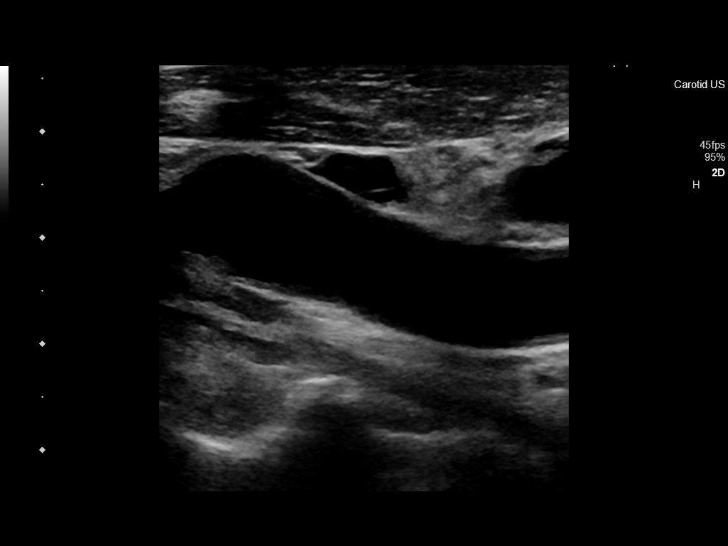
[im 49/66]
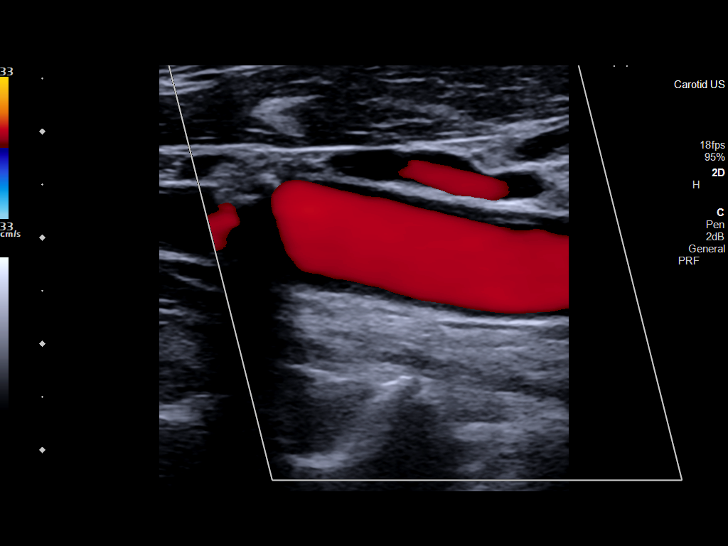
[im 54/66]
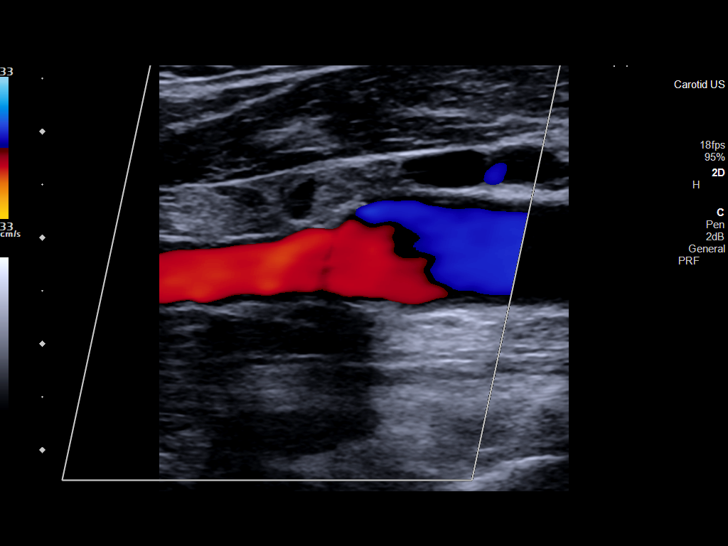
[im 60/66]
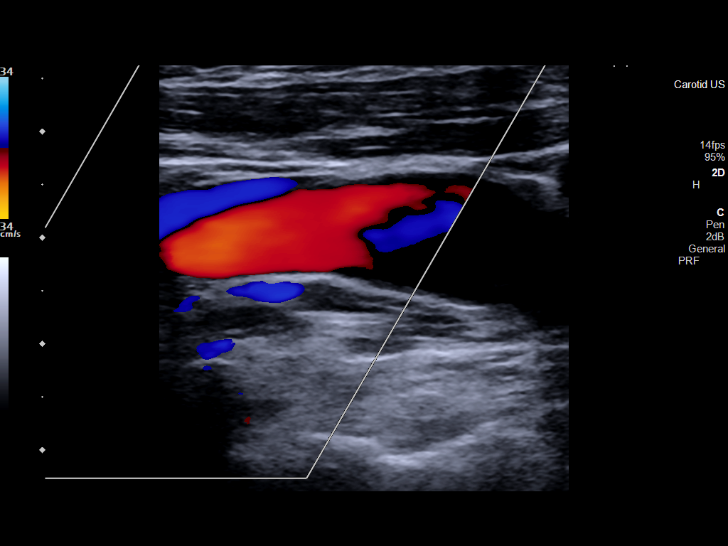
[im 66/66]
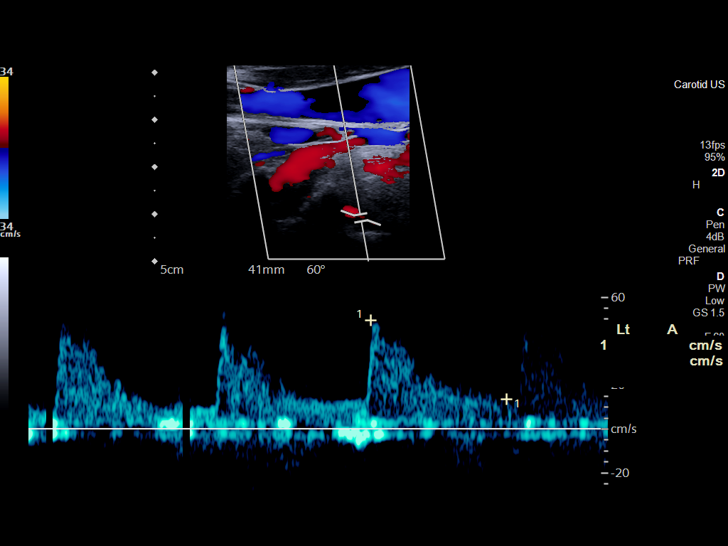

[13 of 24 positions shown; findings below may reference images not displayed]

FINDINGS: Criteria: Quantification of carotid stenosis is based on velocity
parameters that correlate the residual internal carotid diameter
with NASCET-based stenosis levels, using the diameter of the distal
internal carotid lumen as the denominator for stenosis measurement.

The following velocity measurements were obtained:

RIGHT

ICA: 72/18 cm/sec

CCA: 90/16 cm/sec

SYSTOLIC ICA/CCA RATIO:

ECA: 94 cm/sec

LEFT

ICA: 48/11 cm/sec

CCA: 56/15 cm/sec

SYSTOLIC ICA/CCA RATIO:

ECA: 102 cm/sec

RIGHT CAROTID ARTERY: Right carotid arteries are patent without
significant plaque or stenosis. External carotid artery is patent
with normal waveform. Normal waveforms and velocities in the
internal carotid artery.

RIGHT VERTEBRAL ARTERY: Antegrade flow and normal waveform in the
right vertebral artery.

LEFT CAROTID ARTERY: Echogenic plaque at the left carotid bulb
region. External carotid artery is patent with normal waveform.
Normal waveforms and velocities in the internal carotid artery.

LEFT VERTEBRAL ARTERY: Antegrade flow and normal waveform in the
left vertebral artery.
IMPRESSION: 1. Right carotid arteries are patent without significant plaque or
stenosis.
2. Small amount of plaque at the left carotid bulb. No significant
stenosis in left internal carotid artery.
3. Bilateral vertebral arteries are patent with antegrade flow.

## 2022-06-26 IMAGING — CR DG HIP (WITH OR WITHOUT PELVIS) 2-3V*L*
3 series · 3 of 3 positions shown · non-contrast
Comparison: None.

CLINICAL DATA: Left-sided groin pain following fall several weeks
ago, initial encounter

EXAM:
DG HIP (WITH OR WITHOUT PELVIS) 2-3V LEFT

[hip ap]
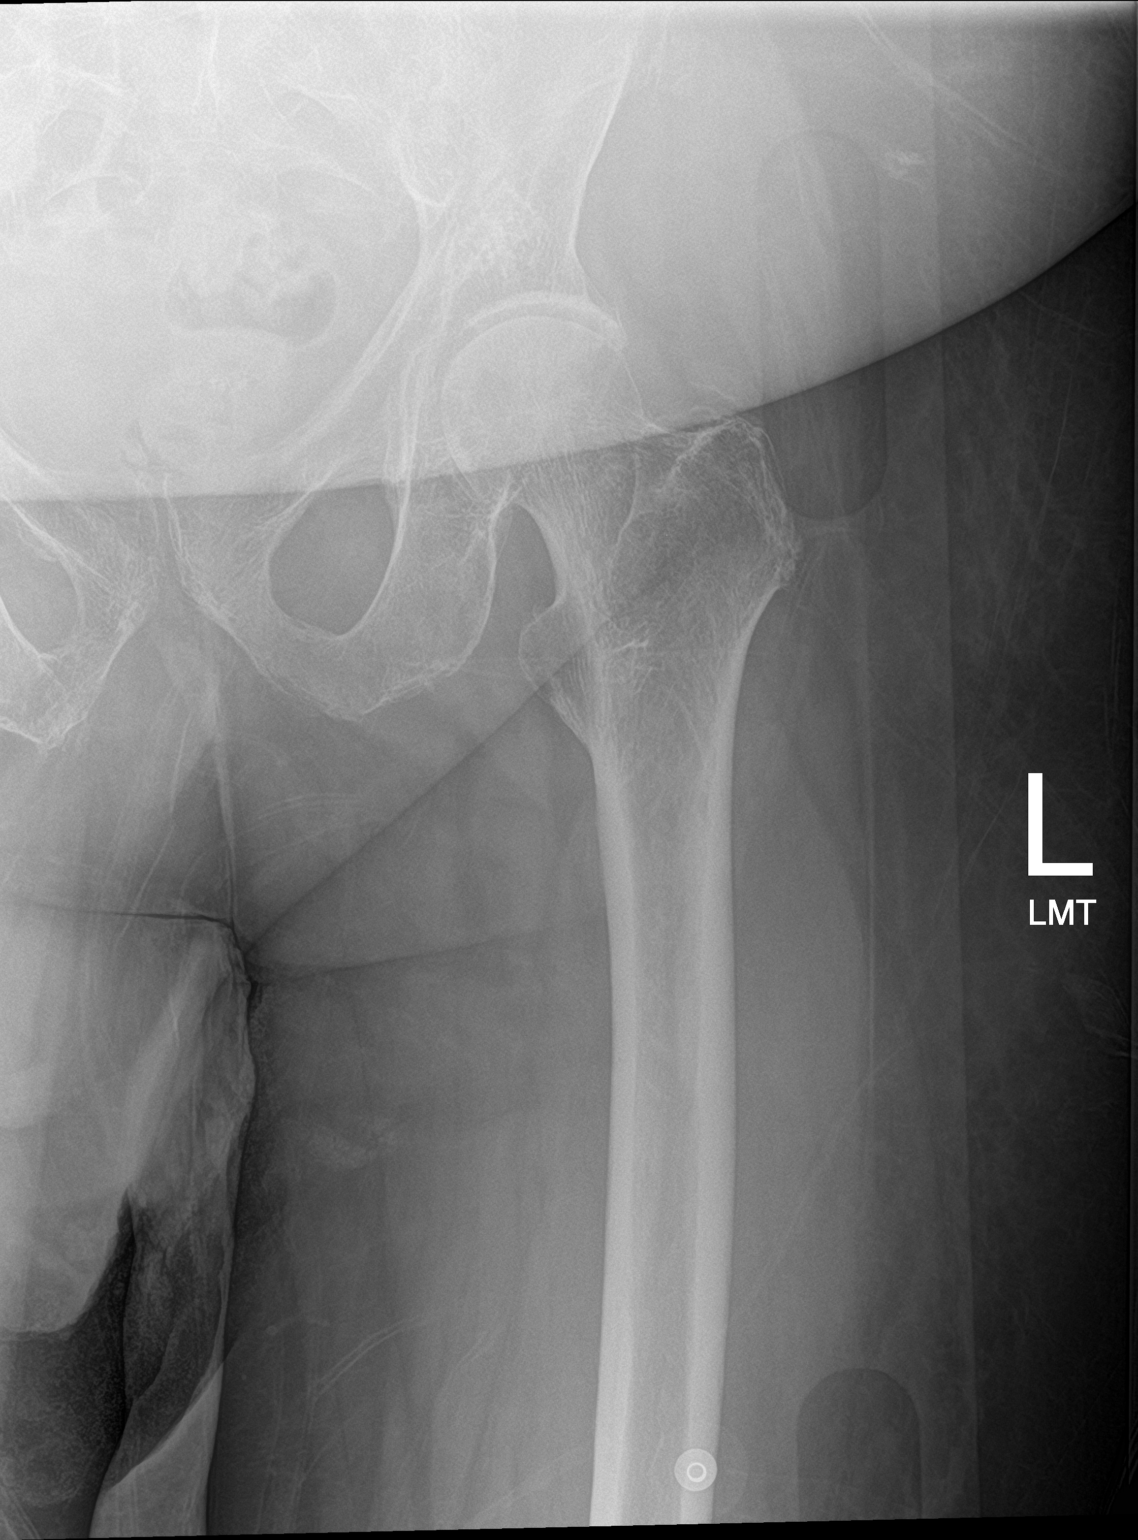

[hip lat]
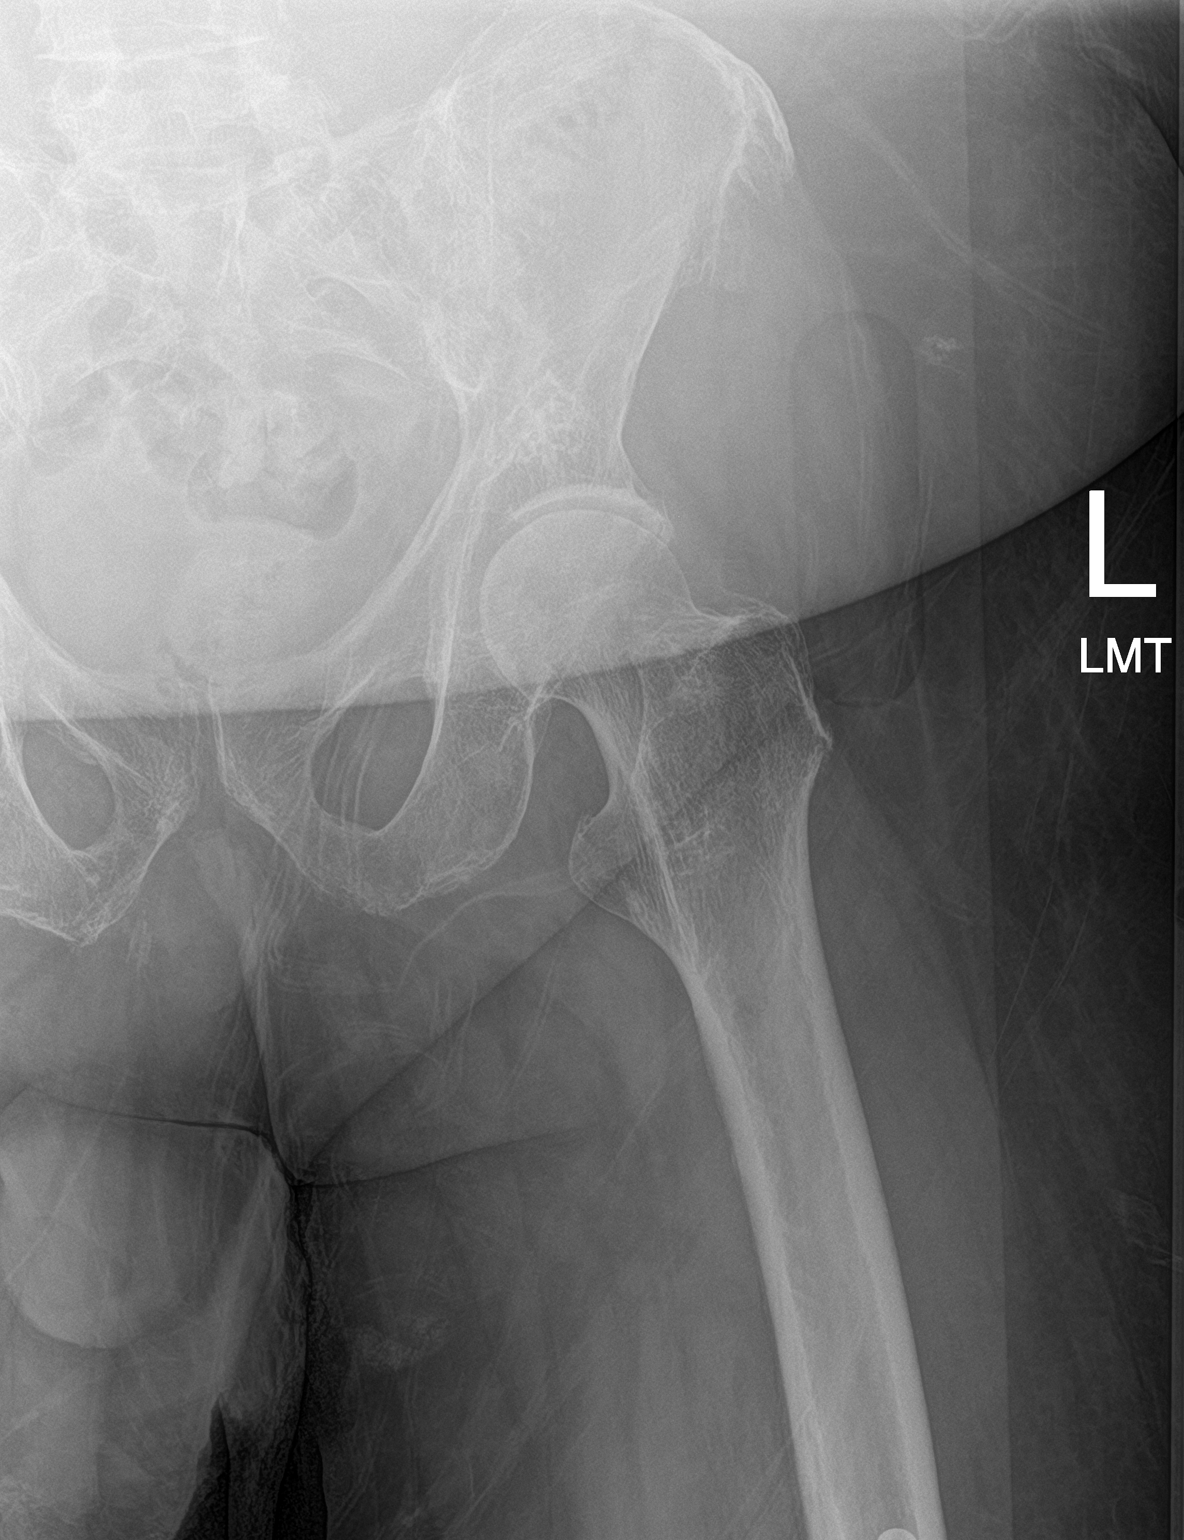

[pelvis ap]
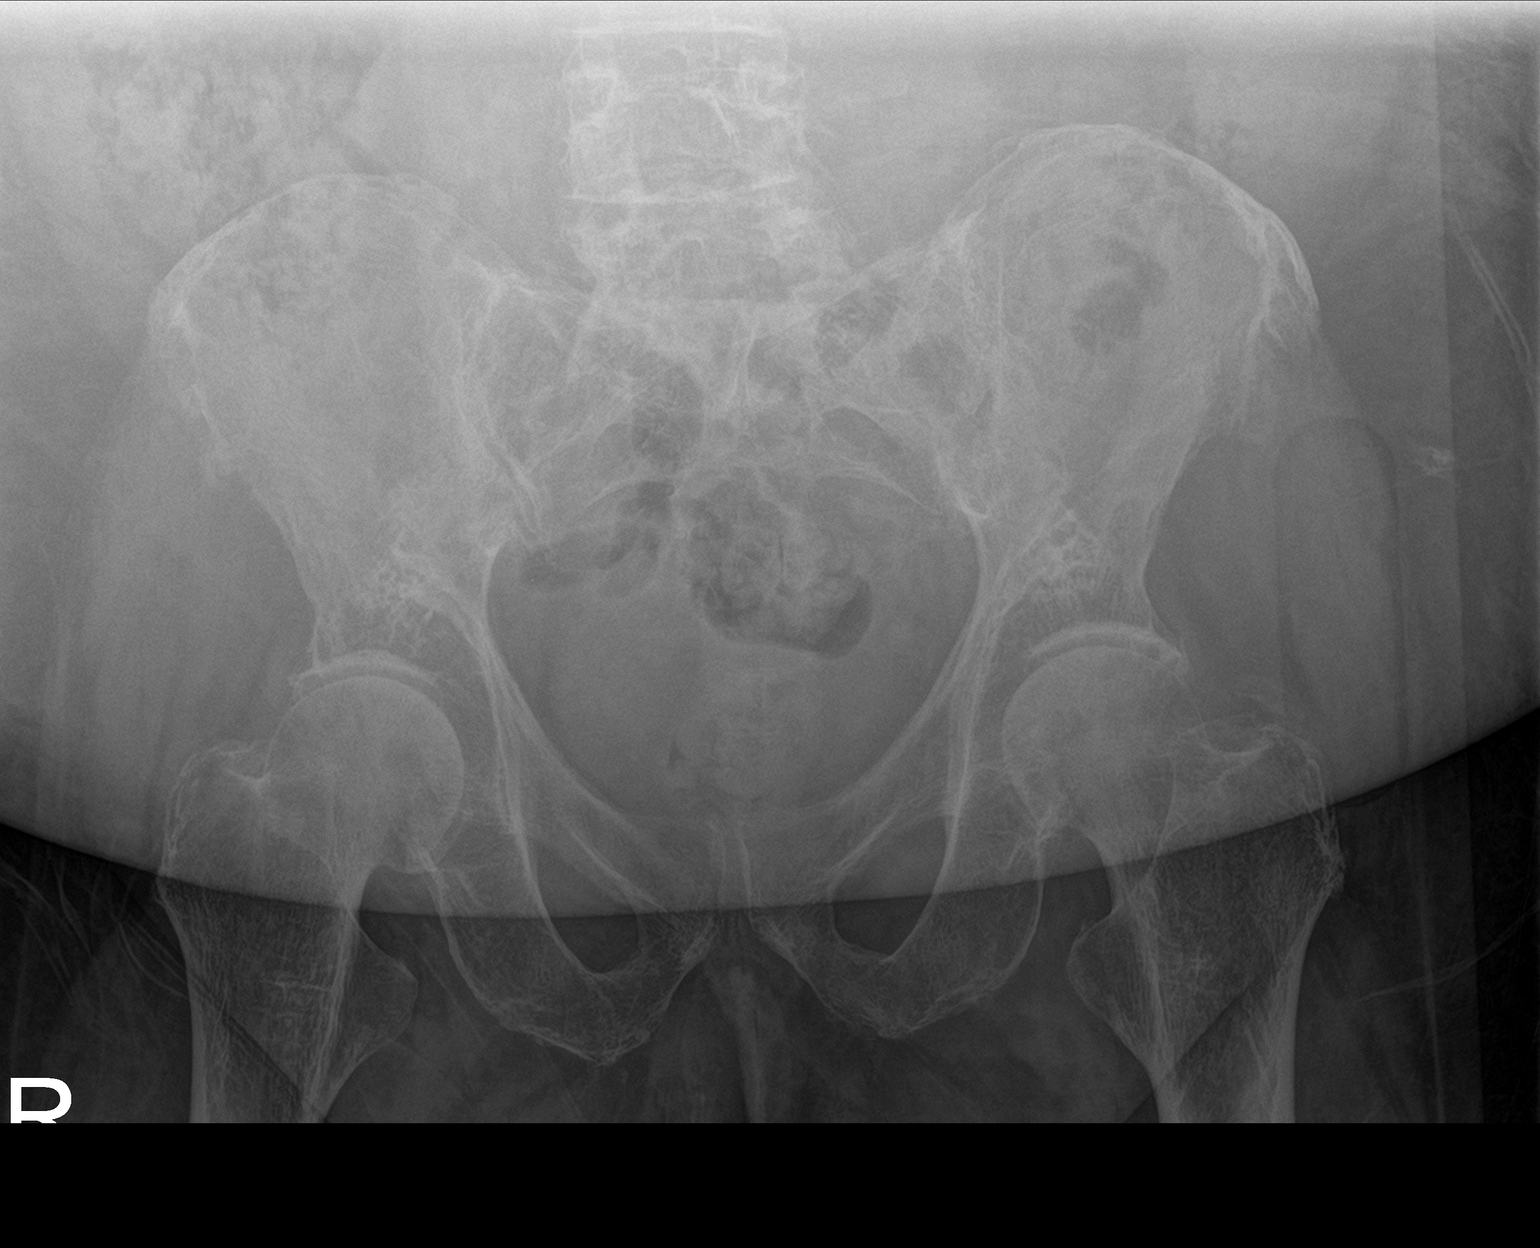

[3 of 3 positions shown; findings below may reference images not displayed]

FINDINGS: Pelvic ring is intact. Degenerative changes of the hip joints are
noted bilaterally. No definitive fracture or dislocation is seen. No
soft tissue abnormality is noted.
IMPRESSION: Degenerative change without acute abnormality.

## 2024-01-25 ENCOUNTER — Other Ambulatory Visit (HOSPITAL_COMMUNITY): Payer: Self-pay
